# Patient Record
Sex: Male | Born: 1950 | Race: White | Hispanic: No | Smoking: Never smoker
Health system: Southern US, Community
[De-identification: ages and names within clinical notes are randomized; demographics above are authoritative.]

## PROBLEM LIST (undated history)

## (undated) DIAGNOSIS — I499 Cardiac arrhythmia, unspecified: Secondary | ICD-10-CM

## (undated) DIAGNOSIS — J449 Chronic obstructive pulmonary disease, unspecified: Secondary | ICD-10-CM

## (undated) DIAGNOSIS — I509 Heart failure, unspecified: Secondary | ICD-10-CM

## (undated) DIAGNOSIS — E785 Hyperlipidemia, unspecified: Secondary | ICD-10-CM

## (undated) DIAGNOSIS — F419 Anxiety disorder, unspecified: Secondary | ICD-10-CM

## (undated) DIAGNOSIS — G4730 Sleep apnea, unspecified: Secondary | ICD-10-CM

## (undated) DIAGNOSIS — F32A Depression, unspecified: Secondary | ICD-10-CM

## (undated) DIAGNOSIS — I10 Essential (primary) hypertension: Secondary | ICD-10-CM

## (undated) DIAGNOSIS — E039 Hypothyroidism, unspecified: Secondary | ICD-10-CM

## (undated) HISTORY — DX: Essential (primary) hypertension: I10

## (undated) HISTORY — PX: FRACTURE SURGERY: SHX138

## (undated) HISTORY — DX: Chronic obstructive pulmonary disease, unspecified: J44.9

## (undated) HISTORY — DX: Hyperlipidemia, unspecified: E78.5

---

## 2018-09-20 DIAGNOSIS — I10 Essential (primary) hypertension: Secondary | ICD-10-CM | POA: Insufficient documentation

## 2018-09-20 DIAGNOSIS — E039 Hypothyroidism, unspecified: Secondary | ICD-10-CM

## 2018-09-20 DIAGNOSIS — I5032 Chronic diastolic (congestive) heart failure: Secondary | ICD-10-CM

## 2018-09-20 DIAGNOSIS — Z6841 Body Mass Index (BMI) 40.0 and over, adult: Secondary | ICD-10-CM

## 2018-09-20 DIAGNOSIS — I5033 Acute on chronic diastolic (congestive) heart failure: Secondary | ICD-10-CM

## 2018-10-02 DIAGNOSIS — S46012A Strain of muscle(s) and tendon(s) of the rotator cuff of left shoulder, initial encounter: Secondary | ICD-10-CM | POA: Insufficient documentation

## 2018-10-31 DIAGNOSIS — G4730 Sleep apnea, unspecified: Secondary | ICD-10-CM

## 2018-10-31 DIAGNOSIS — G4733 Obstructive sleep apnea (adult) (pediatric): Secondary | ICD-10-CM

## 2019-03-16 DIAGNOSIS — M1711 Unilateral primary osteoarthritis, right knee: Secondary | ICD-10-CM | POA: Insufficient documentation

## 2019-10-31 DIAGNOSIS — E785 Hyperlipidemia, unspecified: Secondary | ICD-10-CM

## 2019-10-31 DIAGNOSIS — E782 Mixed hyperlipidemia: Secondary | ICD-10-CM

## 2019-11-14 DIAGNOSIS — E538 Deficiency of other specified B group vitamins: Secondary | ICD-10-CM | POA: Insufficient documentation

## 2019-11-14 DIAGNOSIS — Z0000 Encounter for general adult medical examination without abnormal findings: Secondary | ICD-10-CM | POA: Insufficient documentation

## 2020-11-04 ENCOUNTER — Other Ambulatory Visit: Payer: Self-pay

## 2020-11-04 ENCOUNTER — Emergency Department: Payer: Medicare Other

## 2020-11-04 ENCOUNTER — Emergency Department
Admission: EM | Admit: 2020-11-04 | Discharge: 2020-11-05 | Disposition: A | Discharge status: Home / Self Care | Payer: Medicare Other | Attending: Emergency Medicine | Admitting: Emergency Medicine

## 2020-11-04 DIAGNOSIS — E039 Hypothyroidism, unspecified: Secondary | ICD-10-CM | POA: Diagnosis not present

## 2020-11-04 DIAGNOSIS — S92302A Fracture of unspecified metatarsal bone(s), left foot, initial encounter for closed fracture: Secondary | ICD-10-CM

## 2020-11-04 DIAGNOSIS — S92511A Displaced fracture of proximal phalanx of right lesser toe(s), initial encounter for closed fracture: Secondary | ICD-10-CM | POA: Diagnosis not present

## 2020-11-04 DIAGNOSIS — S8255XA Nondisplaced fracture of medial malleolus of left tibia, initial encounter for closed fracture: Secondary | ICD-10-CM | POA: Diagnosis not present

## 2020-11-04 DIAGNOSIS — W010XXA Fall on same level from slipping, tripping and stumbling without subsequent striking against object, initial encounter: Secondary | ICD-10-CM | POA: Insufficient documentation

## 2020-11-04 DIAGNOSIS — I110 Hypertensive heart disease with heart failure: Secondary | ICD-10-CM | POA: Insufficient documentation

## 2020-11-04 DIAGNOSIS — S99912A Unspecified injury of left ankle, initial encounter: Secondary | ICD-10-CM | POA: Diagnosis present

## 2020-11-04 DIAGNOSIS — S82892A Other fracture of left lower leg, initial encounter for closed fracture: Secondary | ICD-10-CM

## 2020-11-04 DIAGNOSIS — M79672 Pain in left foot: Secondary | ICD-10-CM

## 2020-11-04 DIAGNOSIS — Y92002 Bathroom of unspecified non-institutional (private) residence single-family (private) house as the place of occurrence of the external cause: Secondary | ICD-10-CM | POA: Diagnosis not present

## 2020-11-04 DIAGNOSIS — W19XXXA Unspecified fall, initial encounter: Secondary | ICD-10-CM

## 2020-11-04 DIAGNOSIS — I509 Heart failure, unspecified: Secondary | ICD-10-CM | POA: Diagnosis not present

## 2020-11-04 DIAGNOSIS — J449 Chronic obstructive pulmonary disease, unspecified: Secondary | ICD-10-CM | POA: Insufficient documentation

## 2020-11-04 NOTE — ED Triage Notes (Signed)
Pt brought in by [facility] from home, they were called out to the house earlier today due to fall. But did not transport, pt fell again and is now having left ankle pain.

## 2020-11-04 NOTE — ED Provider Notes (Signed)
[facility] Emergency Department Provider Note   ____________________________________________   Event Date/Time   First MD Initiated Contact with Patient 11/04/20 2321     (approximate)  I have reviewed the triage vital signs and the nursing notes.   HISTORY  Chief Complaint Foot Pain    HPI Christopher [name] is a 70 y.o. male brought to the ED via EMS from home with a chief complaint of left foot and ankle pain status post mechanical slip and fall in the bathroom. Patient denies multiple falls today as reported by triage. Denies striking head or LOC. Complains of left foot and ankle pain. Denies patient changes, neck pain, chest pain, shortness of breath, abdominal pain, nausea, vomiting or dizziness.  History of COPD, CHF on continuous oxygen.     Past medical history Acquired hypothyroidism, unspecified (09/20/2018), Benign essential hypertension (09/20/2018), COPD (chronic obstructive pulmonary disease) (CMS-HCC), Depression, Hyperlipidemia, mixed (09/20/2018), Hypertension, Kidney stone, and Osteoarthritis   Prior to Admission medications   Medication Sig Start Date End Date Taking? Authorizing Provider  oxyCODONE-acetaminophen (PERCOCET/ROXICET) 5-325 MG tablet Take 1 tablet by mouth every 4 (four) hours as needed for severe pain. 11/05/20  Yes Paulette Blanch, MD    Allergies Patient has no allergy information on record.  No family history on file.  Social History    Review of Systems  Constitutional: No fever/chills Eyes: No visual changes. ENT: No sore throat. Cardiovascular: Denies chest pain. Respiratory: Denies shortness of breath. Gastrointestinal: No abdominal pain.  No nausea, no vomiting.  No diarrhea.  No constipation. Genitourinary: Negative for dysuria. Musculoskeletal: Positive for left foot and ankle pain.  Negative for back pain. Skin: Negative for rash. Neurological: Negative for headaches, focal weakness or  numbness.   ____________________________________________   PHYSICAL EXAM:  VITAL SIGNS: ED Triage Vitals  Enc Vitals Group     BP 11/04/20 2313 (!) 120/59     Pulse Rate 11/04/20 2313 86     Resp 11/04/20 2313 20     Temp 11/04/20 2313 99.5 F (37.5 C)     Temp Source 11/04/20 2313 Oral     SpO2 11/04/20 2313 92 %     Weight 11/04/20 2310 (!) 370 lb (167.8 kg)     Height 11/04/20 2310 6' (1.829 m)     Head Circumference --      Peak Flow --      Pain Score 11/04/20 2310 1     Pain Loc --      Pain Edu? --      Excl. in [place]? --     Constitutional: Alert and oriented. Well appearing and in no acute distress. Eyes: Conjunctivae are normal. PERRL. EOMI. Head: Atraumatic. Nose: Atraumatic. Mouth/Throat: Mucous membranes are moist.  No dental malocclusion.   Neck: No stridor.  No cervical spine tenderness to palpation. Cardiovascular: Normal rate, regular rhythm. Grossly normal heart sounds.  Good peripheral circulation. Respiratory: Normal respiratory effort.  No retractions. Lungs CTAB. Gastrointestinal: Obese.  Soft and nontender. No distention. No abdominal bruits. No CVA tenderness. Musculoskeletal:  BLE chronic lymphedema.  Left medial malleolus with mild swelling, tenderness to palpation and limited range of motion secondary to pain.  Tender to palpation dorsal left foot.  Palpable distal pulses.  Brisk, less than 5-second capillary refill. Neurologic:  Normal speech and language. No gross focal neurologic deficits are appreciated.  Skin:  Skin is warm, dry and intact. No rash noted. Psychiatric: Mood and affect are normal. Speech and behavior  are normal.  ____________________________________________   LABS (all labs ordered are listed, but only abnormal results are displayed)  Labs Reviewed - No data to display ____________________________________________  EKG  None ____________________________________________  RADIOLOGY I, Cortez Flippen J, personally viewed and  evaluated these images (plain radiographs) as part of my medical decision making, as well as reviewing the written report by the radiologist.  ED MD interpretation: Medial malleolar fracture, small distal tibial avulsion fracture, small first metatarsal intra-articular fracture  Official radiology report(s): DG Ankle Complete Left  Result Date: 11/04/2020 CLINICAL DATA:  Injury and pain fall EXAM: LEFT ANKLE COMPLETE - 3+ VIEW COMPARISON:  None. FINDINGS: Marked soft tissue edema. Acute mildly comminuted medial malleolar fracture. Ankle mortise grossly symmetric. Small avulsion fracture injury off the anterior inferior tibia. Small intra-articular fracture at the base of the first metatarsal. Moderate plantar calcaneal spur. IMPRESSION: 1. Acute mildly comminuted medial malleolar fracture. 2. Small avulsion fracture injury off the distal anterior inferior tibia. 3. Small intra-articular fracture at the base of the first metatarsal. Electronically Signed   By: Donavan Foil M.D.   On: 11/04/2020 23:49   DG Foot Complete Left  Result Date: 11/04/2020 CLINICAL DATA:  Fall with pain EXAM: LEFT FOOT - COMPLETE 3+ VIEW COMPARISON:  None. FINDINGS: Abundant soft tissue edema. Acute nondisplaced medial malleolar fracture. Small intra-articular fracture at the volar base of the first metatarsal on lateral view. Avulsion fracture off the anterior inferior distal tibia. Large plantar calcaneal spur. Alignment and assessment for Lisfranc injury limited secondary to positioning. IMPRESSION: 1. Acute nondisplaced medial malleolar fracture. 2. Small intra-articular fracture at the volar base of the first metatarsal. 3. Avulsion fracture off the anterior inferior tibia. 4. Question malalignment at the Lisfranc interval but limited secondary to positioning. Electronically Signed   By: Donavan Foil M.D.   On: 11/04/2020 23:53    ____________________________________________   PROCEDURES  Procedure(s) performed  (including Critical Care):  .Splint Application  Date/Time: 11/05/2020 1:20 AM Performed by: Paulette Blanch, MD Authorized by: Paulette Blanch, MD   Consent:    Consent obtained:  Verbal   Consent given by:  Patient   Risks, benefits, and alternatives were discussed: yes     Risks discussed:  Discoloration, numbness, pain and swelling   Alternatives discussed:  No treatment Pre-procedure details:    Distal neurologic exam:  Normal   Distal perfusion: distal pulses strong and brisk capillary refill   Procedure details:    Location:  Ankle   Ankle location:  L ankle   Splint type:  Ankle stirrup   Supplies:  Cotton padding, elastic bandage and plaster Post-procedure details:    Distal neurologic exam:  Normal   Distal perfusion: distal pulses strong and brisk capillary refill     Procedure completion:  Tolerated well, no immediate complications   Post-procedure imaging: not applicable       ____________________________________________   INITIAL IMPRESSION / ASSESSMENT AND PLAN / ED COURSE  As part of my medical decision making, I reviewed the following data within the [place] notes reviewed and incorporated, Old chart reviewed, Radiograph reviewed, Notes from prior ED visits and [place] Controlled Substance Database     70 year old male presenting with left ankle and foot pain status post mechanical fall.  Will obtain x-ray imaging studies, administer analgesia and reassess.  Clinical Course as of 11/05/20 0019  Wed Nov 05, 2020  0016 Updated patient on imaging results.  He has help at home to help  him get around.  Religiously uses his walker already.  Has seen Dr. Roland Rack from orthopedics in the past and will follow up with him.  Will place in splint, administer Percocet, educated patient on nonweightbearing on left leg.  Strict return precautions given.  Patient verbalizes understanding and agrees with plan of care. [JS]    Clinical Course User Index [JS]  Paulette Blanch, MD     ____________________________________________   FINAL CLINICAL IMPRESSION(S) / ED DIAGNOSES  Final diagnoses:  Fall, initial encounter  Closed fracture of left ankle, initial encounter  Fracture of unspecified metatarsal bone(s), left foot, initial encounter for closed fracture  Foot pain, left     ED Discharge Orders         Ordered    oxyCODONE-acetaminophen (PERCOCET/ROXICET) 5-325 MG tablet  Every 4 hours PRN        11/05/20 0014          *Please note:  Uhuru Hacking was evaluated in Emergency Department on 11/05/2020 for the symptoms described in the history of present illness. He was evaluated in the context of the global COVID-19 pandemic, which necessitated consideration that the patient might be at risk for infection with the SARS-CoV-2 virus that causes COVID-19. Institutional protocols and algorithms that pertain to the evaluation of patients at risk for COVID-19 are in a state of rapid change based on information released by regulatory bodies including the CDC and federal and state organizations. These policies and algorithms were followed during the patient's care in the ED.  Some ED evaluations and interventions may be delayed as a result of limited staffing during and the pandemic.*   Note:  This document was prepared using Dragon voice recognition software and may include unintentional dictation errors.   Paulette Blanch, MD 11/05/20 [phone]

## 2020-11-04 NOTE — ED Provider Notes (Incomplete)
[facility] Emergency Department Provider Note   ____________________________________________   Event Date/Time   First MD Initiated Contact with Patient 11/04/20 2321     (approximate)  I have reviewed the triage vital signs and the nursing notes.   HISTORY  Chief Complaint Foot Pain    HPI Christopher [name] is a 70 y.o. male brought to the ED via EMS from home with a chief complaint of left foot and ankle pain status post mechanical slip and fall in the bathroom. Patient denies multiple falls today as reported by triage. Denies striking head or LOC. Complains of left foot and ankle pain. Denies patient changes, neck pain, chest pain, shortness of breath, abdominal pain, nausea, vomiting or dizziness.  History of COPD, CHF on continuous oxygen.     Past medical history Acquired hypothyroidism, unspecified (09/20/2018), Benign essential hypertension (09/20/2018), COPD (chronic obstructive pulmonary disease) (CMS-HCC), Depression, Hyperlipidemia, mixed (09/20/2018), Hypertension, Kidney stone, and Osteoarthritis   Prior to Admission medications   Not on File    Allergies Patient has no allergy information on record.  No family history on file.  Social History    Review of Systems  Constitutional: No fever/chills Eyes: No visual changes. ENT: No sore throat. Cardiovascular: Denies chest pain. Respiratory: Denies shortness of breath. Gastrointestinal: No abdominal pain.  No nausea, no vomiting.  No diarrhea.  No constipation. Genitourinary: Negative for dysuria. Musculoskeletal: Positive for left foot and ankle pain.  Negative for back pain. Skin: Negative for rash. Neurological: Negative for headaches, focal weakness or numbness.   ____________________________________________   PHYSICAL EXAM:  VITAL SIGNS: ED Triage Vitals  Enc Vitals Group     BP 11/04/20 2313 (!) 120/59     Pulse Rate 11/04/20 2313 86     Resp 11/04/20 2313 20      Temp 11/04/20 2313 99.5 F (37.5 C)     Temp Source 11/04/20 2313 Oral     SpO2 11/04/20 2313 92 %     Weight 11/04/20 2310 (!) 370 lb (167.8 kg)     Height 11/04/20 2310 6' (1.829 m)     Head Circumference --      Peak Flow --      Pain Score 11/04/20 2310 1     Pain Loc --      Pain Edu? --      Excl. in [place]? --     Constitutional: Alert and oriented. Well appearing and in no acute distress. Eyes: Conjunctivae are normal. PERRL. EOMI. Head: Atraumatic. Nose: Atraumatic. Mouth/Throat: Mucous membranes are moist.  No dental malocclusion.   Neck: No stridor.  No cervical spine tenderness to palpation. Cardiovascular: Normal rate, regular rhythm. Grossly normal heart sounds.  Good peripheral circulation. Respiratory: Normal respiratory effort.  No retractions. Lungs CTAB. Gastrointestinal: Obese.  Soft and nontender. No distention. No abdominal bruits. No CVA tenderness. Musculoskeletal:  BLE chronic lymphedema.  Left medial malleolus with mild swelling, tenderness to palpation and limited range of motion secondary to pain.  Tender to palpation dorsal left foot.  Palpable distal pulses.  Brisk, less than 5-second capillary refill. Neurologic:  Normal speech and language. No gross focal neurologic deficits are appreciated.  Skin:  Skin is warm, dry and intact. No rash noted. Psychiatric: Mood and affect are normal. Speech and behavior are normal.  ____________________________________________   LABS (all labs ordered are listed, but only abnormal results are displayed)  Labs Reviewed - No data to display ____________________________________________  EKG  None ____________________________________________  RADIOLOGY I, SUNG,JADE J, personally viewed and evaluated these images (plain radiographs) as part of my medical decision making, as well as reviewing the written report by the radiologist.  ED MD interpretation:  ***  Official radiology report(s): No results  found.  ____________________________________________   PROCEDURES  Procedure(s) performed (including Critical Care):  Procedures   ____________________________________________   INITIAL IMPRESSION / ASSESSMENT AND PLAN / ED COURSE  As part of my medical decision making, I reviewed the following data within the [place] notes reviewed and incorporated, Old chart reviewed, Radiograph reviewed, Notes from prior ED visits and [place] Controlled Substance Database     70 year old male presenting with left ankle and foot pain status post mechanical fall.      ____________________________________________   FINAL CLINICAL IMPRESSION(S) / ED DIAGNOSES  Final diagnoses:  Fall, initial encounter     ED Discharge Orders    None      *Please note:  Christopher [name] was evaluated in Emergency Department on 11/04/2020 for the symptoms described in the history of present illness. He was evaluated in the context of the global COVID-19 pandemic, which necessitated consideration that the patient might be at risk for infection with the SARS-CoV-2 virus that causes COVID-19. Institutional protocols and algorithms that pertain to the evaluation of patients at risk for COVID-19 are in a state of rapid change based on information released by regulatory bodies including the CDC and federal and state organizations. These policies and algorithms were followed during the patient's care in the ED.  Some ED evaluations and interventions may be delayed as a result of limited staffing during and the pandemic.*   Note:  This document was prepared using Dragon voice recognition software and may include unintentional dictation errors.

## 2020-11-05 MED ORDER — OXYCODONE-ACETAMINOPHEN 5-325 MG PO TABS
1.0000 | ORAL_TABLET | Freq: Once | ORAL | Status: AC
  Administered 2020-11-05: 1 via ORAL
  Filled 2020-11-05: qty 1

## 2020-11-05 MED ORDER — OXYCODONE-ACETAMINOPHEN 5-325 MG PO TABS: 1.0000 | ORAL_TABLET | ORAL | 0 refills | Status: DC | PRN

## 2020-11-05 NOTE — ED Notes (Signed)
Splint applied by ED tech Jess, assessed by Dr. Beather Arbour.

## 2020-11-05 NOTE — Discharge Instructions (Signed)
1.  Keep splint clean and dry.  Use your walker at all times walk.  Do not bear weight on your left leg.  Elevate affected area and apply ice over splint several times daily to reduce swelling. 2.  You may take Percocet as needed for pain. 3.  Return to the ER for worsening symptoms, persistent vomiting, difficulty breathing or other concerns.

## 2020-11-05 NOTE — ED Notes (Signed)
Pt reports he has a walker and crutches at home, reports he feels comfortable to be discharged and does not feel he needs additional teaching to use them. Education provided not to put weight on splint. Pt and pt's daughter report they feel safe to be discharged home, pt's daughter driving pt home. Pt assisted into pt's daughters care.

## 2020-11-06 ENCOUNTER — Other Ambulatory Visit: Payer: Self-pay | Admitting: Podiatry

## 2020-11-06 DIAGNOSIS — S93325A Dislocation of tarsometatarsal joint of left foot, initial encounter: Secondary | ICD-10-CM

## 2020-11-10 ENCOUNTER — Other Ambulatory Visit: Payer: Self-pay

## 2020-11-10 ENCOUNTER — Ambulatory Visit
Admission: RE | Admit: 2020-11-10 | Discharge: 2020-11-10 | Disposition: A | Discharge status: Home / Self Care | Payer: Medicare Other | Source: Ambulatory Visit | Attending: Podiatry | Admitting: Podiatry

## 2020-11-10 DIAGNOSIS — S93325A Dislocation of tarsometatarsal joint of left foot, initial encounter: Secondary | ICD-10-CM | POA: Insufficient documentation

## 2020-12-16 ENCOUNTER — Inpatient Hospital Stay
Admission: EM | Admit: 2020-12-16 | Discharge: 2020-12-22 | DRG: 493 | Disposition: A | Discharge status: Home / Self Care | Payer: Medicare Other | Attending: Hospitalist | Admitting: Hospitalist

## 2020-12-16 ENCOUNTER — Other Ambulatory Visit: Payer: Self-pay

## 2020-12-16 ENCOUNTER — Emergency Department: Payer: Medicare Other

## 2020-12-16 DIAGNOSIS — I130 Hypertensive heart and chronic kidney disease with heart failure and stage 1 through stage 4 chronic kidney disease, or unspecified chronic kidney disease: Secondary | ICD-10-CM | POA: Diagnosis present

## 2020-12-16 DIAGNOSIS — N1832 Chronic kidney disease, stage 3b: Secondary | ICD-10-CM | POA: Diagnosis present

## 2020-12-16 DIAGNOSIS — M25579 Pain in unspecified ankle and joints of unspecified foot: Secondary | ICD-10-CM | POA: Diagnosis present

## 2020-12-16 DIAGNOSIS — M869 Osteomyelitis, unspecified: Secondary | ICD-10-CM | POA: Insufficient documentation

## 2020-12-16 DIAGNOSIS — F341 Dysthymic disorder: Secondary | ICD-10-CM | POA: Diagnosis not present

## 2020-12-16 DIAGNOSIS — G4730 Sleep apnea, unspecified: Secondary | ICD-10-CM

## 2020-12-16 DIAGNOSIS — E039 Hypothyroidism, unspecified: Secondary | ICD-10-CM | POA: Diagnosis present

## 2020-12-16 DIAGNOSIS — E782 Mixed hyperlipidemia: Secondary | ICD-10-CM | POA: Diagnosis present

## 2020-12-16 DIAGNOSIS — I5032 Chronic diastolic (congestive) heart failure: Secondary | ICD-10-CM

## 2020-12-16 DIAGNOSIS — S9305XA Dislocation of left ankle joint, initial encounter: Secondary | ICD-10-CM | POA: Diagnosis present

## 2020-12-16 DIAGNOSIS — S82892A Other fracture of left lower leg, initial encounter for closed fracture: Secondary | ICD-10-CM | POA: Diagnosis present

## 2020-12-16 DIAGNOSIS — Z888 Allergy status to other drugs, medicaments and biological substances status: Secondary | ICD-10-CM | POA: Diagnosis not present

## 2020-12-16 DIAGNOSIS — G4733 Obstructive sleep apnea (adult) (pediatric): Secondary | ICD-10-CM | POA: Diagnosis present

## 2020-12-16 DIAGNOSIS — F32A Depression, unspecified: Secondary | ICD-10-CM

## 2020-12-16 DIAGNOSIS — S9305XS Dislocation of left ankle joint, sequela: Secondary | ICD-10-CM

## 2020-12-16 DIAGNOSIS — Z6841 Body Mass Index (BMI) 40.0 and over, adult: Secondary | ICD-10-CM | POA: Diagnosis not present

## 2020-12-16 DIAGNOSIS — S8252XA Displaced fracture of medial malleolus of left tibia, initial encounter for closed fracture: Principal | ICD-10-CM | POA: Diagnosis present

## 2020-12-16 DIAGNOSIS — Z9989 Dependence on other enabling machines and devices: Secondary | ICD-10-CM

## 2020-12-16 DIAGNOSIS — J449 Chronic obstructive pulmonary disease, unspecified: Secondary | ICD-10-CM | POA: Diagnosis present

## 2020-12-16 DIAGNOSIS — Z79899 Other long term (current) drug therapy: Secondary | ICD-10-CM | POA: Diagnosis not present

## 2020-12-16 DIAGNOSIS — W010XXA Fall on same level from slipping, tripping and stumbling without subsequent striking against object, initial encounter: Secondary | ICD-10-CM | POA: Diagnosis present

## 2020-12-16 DIAGNOSIS — G629 Polyneuropathy, unspecified: Secondary | ICD-10-CM | POA: Diagnosis present

## 2020-12-16 DIAGNOSIS — Z79890 Hormone replacement therapy (postmenopausal): Secondary | ICD-10-CM

## 2020-12-16 DIAGNOSIS — Z20822 Contact with and (suspected) exposure to covid-19: Secondary | ICD-10-CM | POA: Diagnosis present

## 2020-12-16 DIAGNOSIS — I5033 Acute on chronic diastolic (congestive) heart failure: Secondary | ICD-10-CM

## 2020-12-16 DIAGNOSIS — E785 Hyperlipidemia, unspecified: Secondary | ICD-10-CM

## 2020-12-16 DIAGNOSIS — Z91048 Other nonmedicinal substance allergy status: Secondary | ICD-10-CM | POA: Diagnosis not present

## 2020-12-16 DIAGNOSIS — F419 Anxiety disorder, unspecified: Secondary | ICD-10-CM | POA: Diagnosis present

## 2020-12-16 LAB — RESP PANEL BY RT-PCR (FLU A&B, COVID) ARPGX2
Influenza A by PCR: NEGATIVE
Influenza B by PCR: NEGATIVE
SARS Coronavirus 2 by RT PCR: NEGATIVE

## 2020-12-16 LAB — BASIC METABOLIC PANEL
Anion gap: 10 (ref 5–15)
BUN: 40 mg/dL — ABNORMAL HIGH (ref 8–23)
CO2: 33 mmol/L — ABNORMAL HIGH (ref 22–32)
Calcium: 9.2 mg/dL (ref 8.9–10.3)
Chloride: 92 mmol/L — ABNORMAL LOW (ref 98–111)
Creatinine, Ser: 1.87 mg/dL — ABNORMAL HIGH (ref 0.61–1.24)
GFR, Estimated: 38 mL/min — ABNORMAL LOW (ref >60)
Glucose, Bld: 95 mg/dL (ref 70–99)
Potassium: 3.8 mmol/L (ref 3.5–5.1)
Sodium: 135 mmol/L (ref 135–145)

## 2020-12-16 LAB — CBC WITH DIFFERENTIAL/PLATELET
Abs Immature Granulocytes: 0.09 10*3/uL — ABNORMAL HIGH (ref 0.00–0.07)
Basophils Absolute: 0.1 10*3/uL (ref 0.0–0.1)
Basophils Relative: 1 %
Eosinophils Absolute: 0.3 10*3/uL (ref 0.0–0.5)
Eosinophils Relative: 3 %
HCT: 41.5 % (ref 39.0–52.0)
Hemoglobin: 13.3 g/dL (ref 13.0–17.0)
Immature Granulocytes: 1 %
Lymphocytes Relative: 12 %
Lymphs Abs: 1 10*3/uL (ref 0.7–4.0)
MCH: 28.9 pg (ref 26.0–34.0)
MCHC: 32 g/dL (ref 30.0–36.0)
MCV: 90 fL (ref 80.0–100.0)
Monocytes Absolute: 0.8 10*3/uL (ref 0.1–1.0)
Monocytes Relative: 9 %
Neutro Abs: 6.6 10*3/uL (ref 1.7–7.7)
Neutrophils Relative %: 74 %
Platelets: 225 10*3/uL (ref 150–400)
RBC: 4.61 MIL/uL (ref 4.22–5.81)
RDW: 14.6 % (ref 11.5–15.5)
WBC: 8.9 10*3/uL (ref 4.0–10.5)
nRBC: 0 % (ref 0.0–0.2)

## 2020-12-16 LAB — PROTIME-INR
INR: 1.1 (ref 0.8–1.2)
Prothrombin Time: 13.9 seconds (ref 11.4–15.2)

## 2020-12-16 LAB — TROPONIN I (HIGH SENSITIVITY)
Troponin I (High Sensitivity): 8 ng/L (ref <18)
Troponin I (High Sensitivity): 7 ng/L (ref <18)

## 2020-12-16 LAB — HIV ANTIBODY (ROUTINE TESTING W REFLEX): HIV Screen 4th Generation wRfx: NONREACTIVE

## 2020-12-16 LAB — BRAIN NATRIURETIC PEPTIDE: B Natriuretic Peptide: 36.7 pg/mL (ref 0.0–100.0)

## 2020-12-16 MED ORDER — OXYCODONE-ACETAMINOPHEN 5-325 MG PO TABS
1.0000 | ORAL_TABLET | Freq: Four times a day (QID) | ORAL | Status: DC | PRN
  Administered 2020-12-16: 2 via ORAL
  Filled 2020-12-16: qty 2

## 2020-12-16 MED ORDER — MORPHINE SULFATE (PF) 2 MG/ML IV SOLN: 0.5000 mg | INTRAVENOUS | Status: DC | PRN

## 2020-12-16 MED ORDER — CHLORHEXIDINE GLUCONATE 4 % EX LIQD
60.0000 mL | Freq: Once | CUTANEOUS | Status: AC
  Administered 2020-12-17: 4 via TOPICAL

## 2020-12-16 MED ORDER — VITAMIN D 25 MCG (1000 UNIT) PO TABS
1000.0000 [IU] | ORAL_TABLET | Freq: Every day | ORAL | Status: DC
  Administered 2020-12-18 – 2020-12-22 (×5): 1000 [IU] via ORAL
  Filled 2020-12-16 (×5): qty 1

## 2020-12-16 MED ORDER — DEXTROSE 5 % IV SOLN
3.0000 g | INTRAVENOUS | Status: AC
  Administered 2020-12-17: 3 g via INTRAVENOUS
  Filled 2020-12-16: qty 3

## 2020-12-16 MED ORDER — ONDANSETRON HCL 4 MG PO TABS: 4.0000 mg | ORAL_TABLET | Freq: Four times a day (QID) | ORAL | Status: DC | PRN

## 2020-12-16 MED ORDER — METOLAZONE 2.5 MG PO TABS
2.5000 mg | ORAL_TABLET | Freq: Every day | ORAL | Status: DC
  Administered 2020-12-18 – 2020-12-19 (×2): 2.5 mg via ORAL
  Filled 2020-12-16 (×3): qty 1

## 2020-12-16 MED ORDER — POTASSIUM CHLORIDE CRYS ER 10 MEQ PO TBCR
10.0000 meq | EXTENDED_RELEASE_TABLET | Freq: Two times a day (BID) | ORAL | Status: DC
  Administered 2020-12-17 – 2020-12-22 (×10): 10 meq via ORAL
  Filled 2020-12-16 (×11): qty 1

## 2020-12-16 MED ORDER — ACETAMINOPHEN 325 MG PO TABS: 650.0000 mg | ORAL_TABLET | Freq: Four times a day (QID) | ORAL | Status: AC | PRN

## 2020-12-16 MED ORDER — GABAPENTIN 300 MG PO CAPS
300.0000 mg | ORAL_CAPSULE | Freq: Three times a day (TID) | ORAL | Status: DC
  Administered 2020-12-17 – 2020-12-22 (×14): 300 mg via ORAL
  Filled 2020-12-16 (×15): qty 1

## 2020-12-16 MED ORDER — ATORVASTATIN CALCIUM 20 MG PO TABS
20.0000 mg | ORAL_TABLET | Freq: Every day | ORAL | Status: DC
  Administered 2020-12-18 – 2020-12-22 (×5): 20 mg via ORAL
  Filled 2020-12-16 (×5): qty 1

## 2020-12-16 MED ORDER — HEPARIN SODIUM (PORCINE) 5000 UNIT/ML IJ SOLN
5000.0000 [IU] | Freq: Three times a day (TID) | INTRAMUSCULAR | Status: AC
  Administered 2020-12-16: 5000 [IU] via SUBCUTANEOUS
  Filled 2020-12-16: qty 1

## 2020-12-16 MED ORDER — FUROSEMIDE 40 MG PO TABS
40.0000 mg | ORAL_TABLET | Freq: Every day | ORAL | Status: DC
  Administered 2020-12-18: 40 mg via ORAL
  Filled 2020-12-16 (×2): qty 1

## 2020-12-16 MED ORDER — ACETAMINOPHEN 650 MG RE SUPP: 650.0000 mg | Freq: Four times a day (QID) | RECTAL | Status: AC | PRN

## 2020-12-16 MED ORDER — BUPROPION HCL ER (SR) 150 MG PO TB12
150.0000 mg | ORAL_TABLET | Freq: Two times a day (BID) | ORAL | Status: DC
  Administered 2020-12-17 – 2020-12-22 (×10): 150 mg via ORAL
  Filled 2020-12-16 (×13): qty 1

## 2020-12-16 MED ORDER — CEFAZOLIN IN SODIUM CHLORIDE 3-0.9 GM/100ML-% IV SOLN
3.0000 g | INTRAVENOUS | Status: DC
  Filled 2020-12-16: qty 100

## 2020-12-16 MED ORDER — ONDANSETRON HCL 4 MG/2ML IJ SOLN: 4.0000 mg | Freq: Four times a day (QID) | INTRAMUSCULAR | Status: DC | PRN

## 2020-12-16 MED ORDER — HYDRALAZINE HCL 25 MG PO TABS: 25.0000 mg | ORAL_TABLET | Freq: Three times a day (TID) | ORAL | Status: DC | PRN

## 2020-12-16 MED ORDER — OXYCODONE-ACETAMINOPHEN 5-325 MG PO TABS: 1.0000 | ORAL_TABLET | ORAL | Status: DC | PRN

## 2020-12-16 MED ORDER — LEVALBUTEROL TARTRATE 45 MCG/ACT IN AERO
2.0000 | INHALATION_SPRAY | RESPIRATORY_TRACT | Status: DC | PRN
  Filled 2020-12-16: qty 15

## 2020-12-16 MED ORDER — TRAMADOL HCL 50 MG PO TABS
50.0000 mg | ORAL_TABLET | Freq: Four times a day (QID) | ORAL | Status: DC | PRN
Start: 2020-12-16 — End: 2020-12-22

## 2020-12-16 MED ORDER — LEVOTHYROXINE SODIUM 88 MCG PO TABS
88.0000 ug | ORAL_TABLET | Freq: Every day | ORAL | Status: DC
  Administered 2020-12-18 – 2020-12-22 (×5): 88 ug via ORAL
  Filled 2020-12-16 (×7): qty 1

## 2020-12-16 MED ORDER — POVIDONE-IODINE 10 % EX SWAB
2.0000 "application " | Freq: Once | CUTANEOUS | Status: AC
  Administered 2020-12-17: 2 via TOPICAL

## 2020-12-16 NOTE — H&P (Signed)
History and Physical   Christopher [name] ZOX:[id number] DOB: [birth year] DOA: 12/16/2020  PCP: Rusty Aus, MD  Outpatient Specialists: Dr. Lanney Gins Patient coming from: home  I have personally briefly reviewed patient's old medical records in [place].  Chief Concern: left ankle pain  HPI: Christopher [name] is a 70 y.o. male with medical history significant for hypertension, heart failure preserved ejection fraction, OSA on CPAP, morbid obesity, hypothyroid, depression, presents to the emergency department for chief concerns of left ankle fracture.  He was sent to the emergency department by his podiatrist due to left ankle fracture acute on chronic.  At bedside, Christopher [name] endorses left ankle pain.  He denies headache, vision changes, nausea, vomiting, abdominal pain, chest pain, shortness of breath, dysuria, hematuria.  He says that the bilateral trace swelling in his lower extremity is normal in his skin changes are normal.  He states that about 6 weeks ago he slipped and fell in the bathroom and fractured his ankle.  He presented to his podiatrist and it has been managed conservatively.  Then on April 13, patient slipped in the bathroom again.  He denies head trauma and loss of consciousness.  Social history: He lives at home with his wife.  His daughter and her boyfriend does live in the home.  He denies tobacco, EtOH, recreational drug use.  Patient is currently retired.  He formally worked as a Barrister's clerk.  Vaccination: he is vaccinated for COVID-19, 2 doses and 1 booster  ROS: Constitutional: no weight change, no fever ENT/Mouth: no sore throat, no rhinorrhea Eyes: no eye pain, no vision changes Cardiovascular: no chest pain, no dyspnea,  no edema, no palpitations Respiratory: no cough, no sputum, no wheezing Gastrointestinal: no nausea, no vomiting, no diarrhea, no constipation Genitourinary: no urinary incontinence, no dysuria, no hematuria Musculoskeletal: no  arthralgias, no myalgias Skin: no skin lesions, no pruritus, Neuro: no weakness, no loss of consciousness, no syncope Psych: no anxiety, no depression, no decrease appetite Heme/Lymph: no bruising, no bleeding  ED Course: Discussed with EDP, patient requiring hospitalization for acute on chronic left ankle fracture.  Vitals in the emergency department showed a temperature of 98.3, respiration rate of 18, heart rate 74, blood pressure 107/69, SPO2 of 91% on room air.  He received 1 dose of oxycodone 5 mg in the ED for pain.  Labs in the emergency department was remarkable for temperature of 98.3, respiration rate of 18, heart rate 74, blood pressure 107/69, SPO2 of 91% on room air.  Labs in the emergency department was remarkable for sodium 135, potassium 3.8, chloride 92, bicarb 33, BUN 40, serum creatinine 1.  8 7, nonfasting blood glucose 95, WBC 8.9, hemoglobin 13.3, platelets 225.  EGFR 38.  Assessment/Plan  Principal Problem:   Closed left ankle fracture Active Problems:   OSA on CPAP   Morbid obesity with BMI of 50.0-59.9, adult (HCC)   Hyperlipidemia, mixed   Depression   Hypothyroid   Chronic diastolic CHF (congestive heart failure) (HCC)   # Closed left ankle fracture - plan for ORIF in the a.m. - Secondary to mechanical fall x 2  - N.p.o. at midnight - Pain control: Acetaminophen 650 mg every 6 hours as needed for mild pain, tramadol 50 mg every 6 hours.  For moderate pain, morphine 0.5 mg IV every 2 hours as needed for severe pain - PT ordered for 12/17/2020  # Hypertension- controlled - Resumed home metolazone 2.5 mg p.o. daily, furosemide 40 mg p.o. daily  #  Hyperlipidemia-atorvastatin 20 mg daily  # Depression/anxiety-resume bupropion 150 mg p.o. 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 3 times daily  resumed  # Obstructive sleep apnea-CPAP nightly ordered  # Morbid obesity  # DVT prophylaxis- TED hose - I gave 1 dose of heparin 5000 units - I have not continued pharmacologic DVT prophylaxis due to anticipation of orthopedic procedure - A.m. team/primary team to initiate pharmacologic DVT prophylaxis when it is appropriate  Chart reviewed.   Cardiac echo on 05/19/2020: Ejection fraction greater than 27%, grade 1 diastolic dysfunction  DVT prophylaxis: heparin 5000 units, one dose  Code Status: Full code Diet: Heart healthy now, n.p.o. at midnight Family Communication: Updated daughter at bedside Disposition Plan: Pending clinical course Consults called: Podiatry Admission status: Inpatient, MedSurg, with telemetry  No past medical history on file.  Social History:  has no history on file for tobacco use, alcohol use, and drug use.  Allergies  Allergen Reactions  . Albuterol Other (See Comments)    Causes afib   . Grass Extracts [Gramineae Pollens] Other (See Comments)    Sneeze and watery eyes  . Other     Other reaction(s): Other (See Comments) Unable to breath when around cats   No family history on file. Family history: Family history reviewed and not pertinent  Prior to Admission medications   Medication Sig Start Date End Date Taking? Authorizing Provider  oxyCODONE-acetaminophen (PERCOCET/ROXICET) 5-325 MG tablet Take 1 tablet by mouth every 4 (four) hours as needed for severe pain. 11/05/20   Paulette Blanch, MD   Physical Exam: Vitals:   12/16/20 1722 12/16/20 1730 12/16/20 1853 12/16/20 1958  BP:  96/63 (!) 147/110 (!) 117/59  Pulse: 81 74 76 77  Resp: (!) '25 16  17  ' Temp:   98.3 F (36.8 C) 98.4 F (36.9 C)  TempSrc:    Oral  SpO2: (!) 85% 95% 100% 94%  Weight:      Height:       Constitutional: appears , NAD, calm, comfortable Eyes: PERRL, lids and conjunctivae normal ENMT: Mucous membranes are moist. Age-appropriate dentition. Hearing  appropriate Neck: normal, supple, no masses, no thyromegaly Respiratory: clear to auscultation bilaterally, no wheezing, no crackles. Normal respiratory effort. No accessory muscle use.  Cardiovascular: Regular rate and rhythm, no murmurs / rubs / gallops.  Bilateral lower extremity trace edema. 2+ pedal pulses. No carotid bruits.  Brace in place of the left lower extremity Abdomen: Morbid obesity with pannus, no tenderness, no masses palpated, no hepatosplenomegaly. Bowel sounds positive.  Musculoskeletal: no clubbing / cyanosis. No joint deformity upper and lower extremities. Good ROM, no contractures, no atrophy. Normal muscle tone.  Skin: no rashes, lesions, ulcers. No induration Neurologic: Sensation intact. Strength 5/5 in all 4.  Psychiatric: Normal judgment and insight. Alert and oriented x 3. Normal mood.   EKG: independently reviewed, showing sinus rhythm with rate of 85, QTc 467  Chest x-ray on Admission: I personally reviewed and I agree with radiologist reading as below.  DG Chest Portable 1 View  Result Date: 12/16/2020 CLINICAL DATA:  Preoperative examination. Patient for fixation of left foot fractures. EXAM: PORTABLE CHEST 1 VIEW COMPARISON:  None. FINDINGS: Lungs are clear. Heart size is normal. No pneumothorax or pleural fluid. No acute or focal bony abnormality. IMPRESSION: No acute disease. Electronically Signed   By: Marcello Moores  Dalessio M.D.   On: 12/16/2020 16:20   Labs on Admission: I have personally reviewed following labs  CBC: Recent Labs  Lab 12/16/20 1547  WBC 8.9  NEUTROABS 6.6  HGB 13.3  HCT 41.5  MCV 90.0  PLT 623   Basic Metabolic Panel: Recent Labs  Lab 12/16/20 1547  NA 135  K 3.8  CL 92*  CO2 33*  GLUCOSE 95  BUN 40*  CREATININE 1.87*  CALCIUM 9.2   GFR: Estimated Creatinine Clearance: 59.1 mL/min (A) (by C-G formula based on SCr of 1.87 mg/dL (H)).  Coagulation Profile: Recent Labs  Lab 12/16/20 1547  INR 1.1   [place] Francisco N Madellyn Denio  D.O. [facility]  If 7PM-7AM, please contact overnight-coverage provider If 7AM-7PM, please contact day coverage provider [email or web address]  12/16/2020, 10:30 PM

## 2020-12-16 NOTE — ED Triage Notes (Signed)
Patient reported via EMS from [facility]. [place] approximately a month ago, fracture in left ankle. Pt sent home in boot. Fell April 13 and bone displaced in ankle. Sent to be admitted for surgery.

## 2020-12-16 NOTE — ED Provider Notes (Signed)
Christopher [name]  ____________________________________________   Event Date/Time   First MD Initiated Contact with Patient 12/16/20 1506     (approximate)  I have reviewed the triage vital signs and the nursing notes.   HISTORY  Chief Complaint Ankle Pain    HPI Christopher [name] is a 70 y.o. male here with left ankle pain.  The patient recently sustained a fracture of his left foot at the end of March.  He had imaging at that time which showed a Lisfranc injury.  He was placed in a boot and nonoperative management was attempted.  Since then, he fell again on 4/13 and has subsequently had progressively worsening pain and swelling of his left ankle.  He went to podiatry today and was sent here for admission for operation likely in the morning.  Patient is otherwise without complaints.  Denies any recent changes in his health.  He denies any recent shortness of breath, cough.  No known COVID exposures.  The pain is 2 out of 10 at rest but 10 out of 10 with any kind of movement or palpation.  No alleviating factors.  No distal numbness or weakness.   No proximal leg swellings or signs of DVT.  No history of DVT       No past medical history on file.  There are no problems to display for this patient.    Prior to Admission medications   Medication Sig Start Date End Date Taking? Authorizing Provider  oxyCODONE-acetaminophen (PERCOCET/ROXICET) 5-325 MG tablet Take 1 tablet by mouth every 4 (four) hours as needed for severe pain. 11/05/20   Paulette Blanch, MD    Allergies Patient has no allergy information on record.  No family history on file.  Social History    Review of Systems  Review of Systems  Constitutional: Negative for chills and fever.  HENT: Negative for sore throat.   Respiratory: Negative for shortness of breath.   Cardiovascular: Negative for chest pain.  Gastrointestinal: Negative for abdominal pain.   Genitourinary: Negative for flank pain.  Musculoskeletal: Positive for arthralgias and gait problem. Negative for neck pain.  Skin: Negative for rash and wound.  Allergic/Immunologic: Negative for immunocompromised state.  Neurological: Positive for weakness. Negative for numbness.  Hematological: Does not bruise/bleed easily.  All other systems reviewed and are negative.    ____________________________________________  PHYSICAL EXAM:      VITAL SIGNS: ED Triage Vitals  Enc Vitals Group     BP 12/16/20 1504 107/69     Pulse Rate 12/16/20 1504 82     Resp 12/16/20 1504 14     Temp 12/16/20 1504 98.3 F (36.8 C)     Temp Source 12/16/20 1504 Oral     SpO2 12/16/20 1504 92 %     Weight 12/16/20 1505 (!) 370 lb (167.8 kg)     Height 12/16/20 1505 6' (1.829 m)     Head Circumference --      Peak Flow --      Pain Score 12/16/20 1504 4     Pain Loc --      Pain Edu? --      Excl. in [place]? --      Physical Exam Vitals and nursing [name] reviewed.  Constitutional:      General: He is not in acute distress.    Appearance: He is well-developed.  HENT:     Head: Normocephalic and atraumatic.  Eyes:  Conjunctiva/sclera: Conjunctivae normal.  Cardiovascular:     Rate and Rhythm: Normal rate and regular rhythm.     Heart sounds: Normal heart sounds. No murmur heard. No friction rub.  Pulmonary:     Effort: Pulmonary effort is normal. No respiratory distress.     Breath sounds: Normal breath sounds. No wheezing or rales.  Abdominal:     General: There is no distension.     Palpations: Abdomen is soft.     Tenderness: There is no abdominal tenderness.  Musculoskeletal:     Cervical back: Neck supple.  Skin:    General: Skin is warm.     Capillary Refill: Capillary refill takes less than 2 seconds.  Neurological:     Mental Status: He is alert and oriented to person, place, and time.     Motor: No abnormal muscle tone.      LOWER EXTREMITY EXAM: LEFT  INSPECTION &  PALPATION: Boot in place. Moderate TTP diffusely about the ankle. No open wounds.  ____________________________________________   LABS (all labs ordered are listed, but only abnormal results are displayed)  Labs Reviewed  CBC WITH DIFFERENTIAL/PLATELET - Abnormal; Notable for the following components:      Result Value   Abs Immature Granulocytes 0.09 (*)    All other components within normal limits  RESP PANEL BY RT-PCR (FLU A&B, COVID) ARPGX2  BASIC METABOLIC PANEL  PROTIME-INR  BRAIN NATRIURETIC PEPTIDE  TYPE AND SCREEN  TROPONIN I (HIGH SENSITIVITY)    ____________________________________________  EKG: Normal sinus rhythm, ventricular to 85.  PR 186, QRS 111, QTc 467.  No acute ST elevations or depressions.  No EKG evidence of acute ischemia or infarct. ________________________________________  RADIOLOGY All imaging, including plain films, CT scans, and ultrasounds, independently reviewed by me, and interpretations confirmed via formal radiology reads.  ED MD interpretation:   Chest x-ray: No active disease  Official radiology report(s): DG Chest Portable 1 View  Result Date: 12/16/2020 CLINICAL DATA:  Preoperative examination. Patient for fixation of left foot fractures. EXAM: PORTABLE CHEST 1 VIEW COMPARISON:  None. FINDINGS: Lungs are clear. Heart size is normal. No pneumothorax or pleural fluid. No acute or focal bony abnormality. IMPRESSION: No acute disease. Electronically Signed   By: Inge Rise M.D.   On: 12/16/2020 16:20    ____________________________________________  PROCEDURES   Procedure(s) performed (including Critical Care):  Procedures  ____________________________________________  INITIAL IMPRESSION / MDM / [place] / ED COURSE  As part of my medical decision making, I reviewed the following data within the [facility] notes reviewed and incorporated, Old chart reviewed, Notes from prior ED visits, and [place]  Controlled Substance Database       *Christopher [name] was evaluated in Emergency Department on 12/16/2020 for the symptoms described in the history of present illness. He was evaluated in the context of the global COVID-19 pandemic, which necessitated consideration that the patient might be at risk for infection with the SARS-CoV-2 virus that causes COVID-19. Institutional protocols and algorithms that pertain to the evaluation of patients at risk for COVID-19 are in a state of rapid change based on information released by regulatory bodies including the CDC and federal and state organizations. These policies and algorithms were followed during the patient's care in the ED.  Some ED evaluations and interventions may be delayed as a result of limited staffing during the pandemic.*     Medical Decision Making: 70 year old male here with worsening left ankle dislocation and Lisfranc  injury.  Sent here by podiatry for medical admission and optimization prior to surgery tomorrow.  Patient is hemodynamically stable.  He does have significant underlying disease including morbid obesity, sleep apnea, CHF, CKD.  No recent illness.  Lab work thus far appears to be at baseline.  No leukocytosis or anemia.  Chest x-ray is clear.  EKG nonischemic.  Admit to medicine.  ____________________________________________  FINAL CLINICAL IMPRESSION(S) / ED DIAGNOSES  Final diagnoses:  Ankle dislocation, left, sequela     MEDICATIONS GIVEN DURING THIS VISIT:  Medications  oxyCODONE-acetaminophen (PERCOCET/ROXICET) 5-325 MG per tablet 1-2 tablet (has no administration in time range)     ED Discharge Orders    None       [name]:  This document was prepared using Dragon voice recognition software and may include unintentional dictation errors.   Duffy Bruce, MD 12/16/20 [phone]

## 2020-12-16 NOTE — Consult Note (Signed)
ORTHOPAEDIC CONSULTATION  REQUESTING PHYSICIAN: Duffy Bruce, MD  Chief Complaint: Left ankle fracture and foot fracture  HPI: Christopher [name] is a 70 y.o. male who complains of left ankle and foot fracture.  Patient was seen by myself in the outpatient clinic earlier today.  Approximately 6 weeks ago he sustained an ankle and foot fracture.  Was treated nonoperatively by myself.  He was placed in a boot and had been minimally weightbearing.  Unfortunately about 2 weeks ago he states he was transitioning from the wheelchair to the bathroom and fell.  He injured his left foot.  Since that time he has noticed his left foot is now abducted and pointing laterally.  X-rays in the outpatient clinic show displacement of the medial malleolus fracture with syndesmotic displacement and lateral shift of the talus to the tibia.  The foot x-rays look to show stability without further displacement in the midfoot region.  Past medical history COPD CHF Chronic kidney disease     Social History   Socioeconomic History  . Marital status: Married    Spouse name: Not on file  . Number of children: Not on file  . Years of education: Not on file  . Highest education level: Not on file  Occupational History  . Not on file  Tobacco Use  . Smoking status: Not on file  . Smokeless tobacco: Not on file  Substance and Sexual Activity  . Alcohol use: Not on file  . Drug use: Not on file  . Sexual activity: Not on file  Other Topics Concern  . Not on file  Social History Narrative  . Not on file   Social Determinants of Health   Financial Resource Strain: Not on file  Food Insecurity: Not on file  Transportation Needs: Not on file  Physical Activity: Not on file  Stress: Not on file  Social Connections: Not on file   No family history on file. Not on File Prior to Admission medications   Medication Sig Start Date End Date Taking? Authorizing Provider  oxyCODONE-acetaminophen  (PERCOCET/ROXICET) 5-325 MG tablet Take 1 tablet by mouth every 4 (four) hours as needed for severe pain. 11/05/20   Paulette Blanch, MD   DG Chest Portable 1 View  Result Date: 12/16/2020 CLINICAL DATA:  Preoperative examination. Patient for fixation of left foot fractures. EXAM: PORTABLE CHEST 1 VIEW COMPARISON:  None. FINDINGS: Lungs are clear. Heart size is normal. No pneumothorax or pleural fluid. No acute or focal bony abnormality. IMPRESSION: No acute disease. Electronically Signed   By: Inge Rise M.D.   On: 12/16/2020 16:20    Positive ROS: All other systems have been reviewed and were otherwise negative with the exception of those mentioned in the HPI and as above.  12 point ROS was performed.  Physical Exam: General: Alert and oriented.  No apparent distress.  Vascular:  Left foot:Dorsalis Pedis:  diminished Posterior Tibial:  diminished secondary to edema  Right foot: Not evaluated today  Neuro:intact  Derm: No open skin breaks or lacerations to the left foot.  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  Ortho/MS: Definite bilateral lower extremity edema.  The left foot is in an abducted position compared to the right side.  There is some instability and pain with palpation to the medial malleoli region.  The midtarsal region seems to be stable.  X-rays in the outpatient setting show displaced medial malleolar fracture with syndesmotic displacement.  Foot shows good stability and  alignment in the midtarsal region for now.  Reviewed previous CT and x-rays from the ER.  It showed the medial malleolus fracture but this was anatomic at the time of his injury.  There was also a posterior malleolus fracture.  Assessment: Displaced left ankle fracture Lisfranc fracture left foot History of COPD CHF and chronic kidney disease  Plan: I discussed the need for surgical intervention for realignment and stabilization of this ankle fracture.  He has  had a lot of difficulty maintaining nonweightbearing status.  He is morbidly obese and tries to move around in a wheelchair but still is attempting to ambulate and bear weight on his left foot.  I discussed the need for strict nonweightbearing to this left side postoperatively.  We will ask for physical therapy evaluation especially after the surgery to evaluate for nonweightbearing status.  Orders written for surgery for tomorrow afternoon.  I discussed the surgery in great detail with the patient.  The plan is for ORIF of the ankle and possible percutaneous stabilization of the midtarsal region if there is any instability under fluoroscopy.  Patient expressed understanding.    Elesa Hacker, DPM Cell [phone]   12/16/2020 5:07 PM

## 2020-12-16 NOTE — Progress Notes (Signed)
Chaplain attempted to visit patient twice. On one attempt patient was on the phone. Second attempt Chaplain was waved off by person with patient. Chaplain returned and patient door was closed.

## 2020-12-16 NOTE — ED Notes (Signed)
Pt admitted to room 154 and placed on telemetry. Pts daughter at bedside and oriented to room  12/16/20 1853  Vitals  Temp 98.3 F (36.8 C)  BP (!) 147/110  MAP (mmHg) 120  BP Location Right Arm  BP Method Automatic  Patient Position (if appropriate) Lying  Pulse Rate 76  Pulse Rate Source Monitor  Level of Consciousness  Level of Consciousness Alert  MEWS COLOR  MEWS Score Color Green  Oxygen Therapy  SpO2 100 %  O2 Device Nasal Cannula  O2 Flow Rate (L/min) 2 L/min

## 2020-12-17 ENCOUNTER — Inpatient Hospital Stay: Payer: Medicare Other

## 2020-12-17 ENCOUNTER — Inpatient Hospital Stay: Payer: Medicare Other | Admitting: Certified Registered"

## 2020-12-17 ENCOUNTER — Encounter: Payer: Self-pay | Admitting: Internal Medicine

## 2020-12-17 ENCOUNTER — Encounter
Admission: EM | Disposition: A | Discharge status: Home / Self Care | Payer: Self-pay | Source: Home / Self Care | Attending: Hospitalist

## 2020-12-17 HISTORY — PX: ORIF ANKLE FRACTURE: SHX5408

## 2020-12-17 LAB — BASIC METABOLIC PANEL
Anion gap: 10 (ref 5–15)
BUN: 40 mg/dL — ABNORMAL HIGH (ref 8–23)
CO2: 34 mmol/L — ABNORMAL HIGH (ref 22–32)
Calcium: 8.8 mg/dL — ABNORMAL LOW (ref 8.9–10.3)
Chloride: 94 mmol/L — ABNORMAL LOW (ref 98–111)
Creatinine, Ser: 1.96 mg/dL — ABNORMAL HIGH (ref 0.61–1.24)
GFR, Estimated: 36 mL/min — ABNORMAL LOW (ref >60)
Glucose, Bld: 100 mg/dL — ABNORMAL HIGH (ref 70–99)
Potassium: 3.7 mmol/L (ref 3.5–5.1)
Sodium: 138 mmol/L (ref 135–145)

## 2020-12-17 LAB — CBC
HCT: 38.9 % — ABNORMAL LOW (ref 39.0–52.0)
Hemoglobin: 12.2 g/dL — ABNORMAL LOW (ref 13.0–17.0)
MCH: 28.2 pg (ref 26.0–34.0)
MCHC: 31.4 g/dL (ref 30.0–36.0)
MCV: 90 fL (ref 80.0–100.0)
Platelets: 204 10*3/uL (ref 150–400)
RBC: 4.32 MIL/uL (ref 4.22–5.81)
RDW: 14.5 % (ref 11.5–15.5)
WBC: 7.2 10*3/uL (ref 4.0–10.5)
nRBC: 0 % (ref 0.0–0.2)

## 2020-12-17 LAB — ABO/RH: ABO/RH(D): A POS

## 2020-12-17 SURGERY — OPEN REDUCTION INTERNAL FIXATION (ORIF) ANKLE FRACTURE
Anesthesia: General | Site: Ankle | Laterality: Left

## 2020-12-17 MED ORDER — ENOXAPARIN SODIUM 40 MG/0.4ML ~~LOC~~ SOLN
40.0000 mg | SUBCUTANEOUS | Status: DC
  Administered 2020-12-18: 40 mg via SUBCUTANEOUS
  Filled 2020-12-17: qty 0.4

## 2020-12-17 MED ORDER — PHENYLEPHRINE HCL (PRESSORS) 10 MG/ML IV SOLN
INTRAVENOUS | Status: DC | PRN
  Administered 2020-12-17 (×3): 100 ug via INTRAVENOUS

## 2020-12-17 MED ORDER — LACTATED RINGERS IV SOLN: INTRAVENOUS | Status: DC | PRN

## 2020-12-17 MED ORDER — SUGAMMADEX SODIUM 500 MG/5ML IV SOLN
INTRAVENOUS | Status: AC
  Filled 2020-12-17: qty 5

## 2020-12-17 MED ORDER — DEXMEDETOMIDINE (PRECEDEX) IN NS 20 MCG/5ML (4 MCG/ML) IV SYRINGE
PREFILLED_SYRINGE | INTRAVENOUS | Status: AC
  Filled 2020-12-17: qty 5

## 2020-12-17 MED ORDER — OXYCODONE HCL 5 MG PO TABS: 5.0000 mg | ORAL_TABLET | Freq: Once | ORAL | Status: DC | PRN

## 2020-12-17 MED ORDER — PROMETHAZINE HCL 25 MG/ML IJ SOLN: 6.2500 mg | INTRAMUSCULAR | Status: DC | PRN

## 2020-12-17 MED ORDER — ACETAMINOPHEN 10 MG/ML IV SOLN
INTRAVENOUS | Status: DC | PRN
  Administered 2020-12-17: 1000 mg via INTRAVENOUS

## 2020-12-17 MED ORDER — ROCURONIUM BROMIDE 100 MG/10ML IV SOLN
INTRAVENOUS | Status: DC | PRN
  Administered 2020-12-17: 50 mg via INTRAVENOUS
  Administered 2020-12-17 (×2): 20 mg via INTRAVENOUS

## 2020-12-17 MED ORDER — DEXAMETHASONE SODIUM PHOSPHATE 10 MG/ML IJ SOLN
INTRAMUSCULAR | Status: DC | PRN
  Administered 2020-12-17: 10 mg via INTRAVENOUS

## 2020-12-17 MED ORDER — ACETAMINOPHEN 10 MG/ML IV SOLN
INTRAVENOUS | Status: AC
  Filled 2020-12-17: qty 100

## 2020-12-17 MED ORDER — DEXMEDETOMIDINE (PRECEDEX) IN NS 20 MCG/5ML (4 MCG/ML) IV SYRINGE
PREFILLED_SYRINGE | INTRAVENOUS | Status: DC | PRN
Start: 2020-12-17 — End: 2020-12-17
  Administered 2020-12-17: 8 ug via INTRAVENOUS
  Administered 2020-12-17: 4 ug via INTRAVENOUS

## 2020-12-17 MED ORDER — FENTANYL CITRATE (PF) 100 MCG/2ML IJ SOLN
INTRAMUSCULAR | Status: AC
  Filled 2020-12-17: qty 2

## 2020-12-17 MED ORDER — SUGAMMADEX SODIUM 500 MG/5ML IV SOLN
INTRAVENOUS | Status: DC | PRN
  Administered 2020-12-17: 350 mg via INTRAVENOUS

## 2020-12-17 MED ORDER — BUPIVACAINE LIPOSOME 1.3 % IJ SUSP
INTRAMUSCULAR | Status: AC
  Filled 2020-12-17: qty 20

## 2020-12-17 MED ORDER — ONDANSETRON HCL 4 MG/2ML IJ SOLN
INTRAMUSCULAR | Status: DC | PRN
  Administered 2020-12-17: 4 mg via INTRAVENOUS

## 2020-12-17 MED ORDER — BUPIVACAINE HCL (PF) 0.5 % IJ SOLN
INTRAMUSCULAR | Status: DC | PRN
  Administered 2020-12-17 (×2): 10 mL

## 2020-12-17 MED ORDER — MEPERIDINE HCL 50 MG/ML IJ SOLN: 6.2500 mg | INTRAMUSCULAR | Status: DC | PRN

## 2020-12-17 MED ORDER — PROPOFOL 10 MG/ML IV BOLUS
INTRAVENOUS | Status: DC | PRN
  Administered 2020-12-17: 170 mg via INTRAVENOUS

## 2020-12-17 MED ORDER — ONDANSETRON HCL 4 MG/2ML IJ SOLN
INTRAMUSCULAR | Status: AC
  Filled 2020-12-17: qty 2

## 2020-12-17 MED ORDER — SUCCINYLCHOLINE CHLORIDE 20 MG/ML IJ SOLN
INTRAMUSCULAR | Status: DC | PRN
  Administered 2020-12-17: 200 mg via INTRAVENOUS

## 2020-12-17 MED ORDER — EPHEDRINE 5 MG/ML INJ
INTRAVENOUS | Status: AC
  Filled 2020-12-17: qty 10

## 2020-12-17 MED ORDER — BUPIVACAINE LIPOSOME 1.3 % IJ SUSP
INTRAMUSCULAR | Status: DC | PRN
  Administered 2020-12-17 (×2): 10 mL

## 2020-12-17 MED ORDER — OXYCODONE-ACETAMINOPHEN 5-325 MG PO TABS
1.0000 | ORAL_TABLET | Freq: Four times a day (QID) | ORAL | Status: DC | PRN
  Administered 2020-12-17: 2 via ORAL
  Administered 2020-12-18 – 2020-12-19 (×2): 1 via ORAL
  Filled 2020-12-17 (×2): qty 1
  Filled 2020-12-17: qty 2

## 2020-12-17 MED ORDER — LIDOCAINE HCL (PF) 1 % IJ SOLN
INTRAMUSCULAR | Status: AC
  Filled 2020-12-17: qty 30

## 2020-12-17 MED ORDER — FENTANYL CITRATE (PF) 100 MCG/2ML IJ SOLN
INTRAMUSCULAR | Status: DC | PRN
  Administered 2020-12-17: 50 ug via INTRAVENOUS
  Administered 2020-12-17 (×2): 25 ug via INTRAVENOUS

## 2020-12-17 MED ORDER — LIDOCAINE HCL (CARDIAC) PF 100 MG/5ML IV SOSY
PREFILLED_SYRINGE | INTRAVENOUS | Status: DC | PRN
  Administered 2020-12-17: 100 mg via INTRAVENOUS

## 2020-12-17 MED ORDER — LIDOCAINE HCL (PF) 2 % IJ SOLN
INTRAMUSCULAR | Status: AC
  Filled 2020-12-17: qty 5

## 2020-12-17 MED ORDER — OXYCODONE HCL 5 MG/5ML PO SOLN: 5.0000 mg | Freq: Once | ORAL | Status: DC | PRN

## 2020-12-17 MED ORDER — MORPHINE SULFATE (PF) 2 MG/ML IV SOLN
1.0000 mg | INTRAVENOUS | Status: DC | PRN
  Administered 2020-12-17: 1 mg via INTRAVENOUS
  Filled 2020-12-17: qty 1

## 2020-12-17 MED ORDER — FENTANYL CITRATE (PF) 100 MCG/2ML IJ SOLN
INTRAMUSCULAR | Status: AC
  Administered 2020-12-17: 50 ug via INTRAVENOUS
  Filled 2020-12-17: qty 2

## 2020-12-17 MED ORDER — FENTANYL CITRATE (PF) 100 MCG/2ML IJ SOLN
25.0000 ug | INTRAMUSCULAR | Status: DC | PRN
  Administered 2020-12-17 (×2): 25 ug via INTRAVENOUS

## 2020-12-17 MED ORDER — BUPIVACAINE HCL (PF) 0.5 % IJ SOLN
INTRAMUSCULAR | Status: AC
  Filled 2020-12-17: qty 30

## 2020-12-17 MED ORDER — PROPOFOL 10 MG/ML IV BOLUS
INTRAVENOUS | Status: AC
  Filled 2020-12-17: qty 40

## 2020-12-17 MED ORDER — SUCCINYLCHOLINE CHLORIDE 200 MG/10ML IV SOSY
PREFILLED_SYRINGE | INTRAVENOUS | Status: AC
  Filled 2020-12-17: qty 10

## 2020-12-17 SURGICAL SUPPLY — 63 items
BIT DRILL 2.4X140 LONG SOLID (BIT) ×2 IMPLANT
BLADE SURG 15 STRL LF DISP TIS (BLADE) IMPLANT
BLADE SURG 15 STRL SS (BLADE)
BNDG COHESIVE 4X5 TAN STRL (GAUZE/BANDAGES/DRESSINGS) ×2 IMPLANT
BNDG CONFORM 2 STRL LF (GAUZE/BANDAGES/DRESSINGS) IMPLANT
BNDG CONFORM 3 STRL LF (GAUZE/BANDAGES/DRESSINGS) ×2 IMPLANT
BNDG ELASTIC 4X5.8 VLCR NS LF (GAUZE/BANDAGES/DRESSINGS) ×2 IMPLANT
BNDG ESMARK 4X12 TAN STRL LF (GAUZE/BANDAGES/DRESSINGS) ×2 IMPLANT
BNDG GAUZE 4.5X4.1 6PLY STRL (MISCELLANEOUS) ×2 IMPLANT
CANISTER SUCT 1200ML W/VALVE (MISCELLANEOUS) IMPLANT
COVER WAND RF STERILE (DRAPES) ×2 IMPLANT
CUFF TOURN SGL QUICK 18X4 (TOURNIQUET CUFF) IMPLANT
CUFF TOURN SGL QUICK 24 (TOURNIQUET CUFF)
CUFF TOURN SGL QUICK 34 (TOURNIQUET CUFF) ×1
CUFF TRNQT CYL 24X4X16.5-23 (TOURNIQUET CUFF) IMPLANT
CUFF TRNQT CYL 34X4.125X (TOURNIQUET CUFF) ×1 IMPLANT
DRAPE C-ARM XRAY 36X54 (DRAPES) ×2 IMPLANT
DRAPE C-ARMOR (DRAPES) IMPLANT
DURAPREP 26ML APPLICATOR (WOUND CARE) ×4 IMPLANT
ELECT REM PT RETURN 9FT ADLT (ELECTROSURGICAL) ×2
ELECTRODE REM PT RTRN 9FT ADLT (ELECTROSURGICAL) ×1 IMPLANT
GAUZE SPONGE 4X4 12PLY STRL (GAUZE/BANDAGES/DRESSINGS) ×2 IMPLANT
GAUZE XEROFORM 1X8 LF (GAUZE/BANDAGES/DRESSINGS) ×2 IMPLANT
GLOVE SURG ENC MOIS LTX SZ7.5 (GLOVE) ×2 IMPLANT
GLOVE SURG UNDER LTX SZ8 (GLOVE) ×2 IMPLANT
GOWN STRL REUS W/ TWL XL LVL3 (GOWN DISPOSABLE) ×3 IMPLANT
GOWN STRL REUS W/TWL XL LVL3 (GOWN DISPOSABLE) ×3
K-WIRE SMOOTH TROCAR 2.0X150 (WIRE) ×4
KIT TURNOVER KIT A (KITS) ×2 IMPLANT
KWIRE SMOOTH TROCAR 2.0X150 (WIRE) ×2 IMPLANT
LABEL OR SOLS (LABEL) ×2 IMPLANT
MANIFOLD NEPTUNE II (INSTRUMENTS) ×2 IMPLANT
NEEDLE HYPO 22GX1.5 SAFETY (NEEDLE) ×2 IMPLANT
NS IRRIG 500ML POUR BTL (IV SOLUTION) ×2 IMPLANT
PACK EXTREMITY ARMC (MISCELLANEOUS) ×2 IMPLANT
PAD PREP 24X41 OB/GYN DISP (PERSONAL CARE ITEMS) ×2 IMPLANT
PLATE FIBULA HOOK 5 HOLE (Plate) ×2 IMPLANT
PUTTY DBX 1CC (Putty) ×2 IMPLANT
PUTTY DBX 1CC DEPUY (Putty) ×1 IMPLANT
SCREW LOCK 3 3.5X18 (Screw) ×2 IMPLANT
SCREW LOCK PLATE R3 3.5X12 (Screw) ×2 IMPLANT
SCREW LOCK PLATE R3 3.5X14 (Screw) ×2 IMPLANT
SCREW LOCK PLATE R3 3.5X36 (Screw) ×1 IMPLANT
SCREW LOCK PLT 36X3.5X GRLL (Screw) ×1 IMPLANT
SCREW R3CON NONLOCK 3.5X38 (Screw) ×2 IMPLANT
SPLINT CAST 1 STEP 4X30 (MISCELLANEOUS) ×2 IMPLANT
SPLINT FAST PLASTER 5X30 (CAST SUPPLIES) ×1
SPLINT PLASTER CAST FAST 5X30 (CAST SUPPLIES) ×1 IMPLANT
SPONGE LAP 18X18 RF (DISPOSABLE) ×2 IMPLANT
STAPLER SKIN PROX 35W (STAPLE) ×2 IMPLANT
STOCKINETTE M/LG 89821 (MISCELLANEOUS) ×2 IMPLANT
STRAP SAFETY 5IN WIDE (MISCELLANEOUS) ×2 IMPLANT
STRIP CLOSURE SKIN 1/2X4 (GAUZE/BANDAGES/DRESSINGS) ×2 IMPLANT
SUCTION FRAZIER HANDLE 10FR (MISCELLANEOUS) ×1
SUCTION TUBE FRAZIER 10FR DISP (MISCELLANEOUS) ×1 IMPLANT
SUT VIC AB 2-0 CT1 27 (SUTURE) ×1
SUT VIC AB 2-0 CT1 TAPERPNT 27 (SUTURE) ×1 IMPLANT
SUT VIC AB 3-0 SH 27 (SUTURE) ×1
SUT VIC AB 3-0 SH 27X BRD (SUTURE) ×1 IMPLANT
SWABSTK COMLB BENZOIN TINCTURE (MISCELLANEOUS) ×2 IMPLANT
SYR 10ML LL (SYRINGE) ×2 IMPLANT
SYR 50ML LL SCALE MARK (SYRINGE) ×2 IMPLANT
WIRE OLIVE SMOOTH 1.4MMX60MM (WIRE) ×4 IMPLANT

## 2020-12-17 NOTE — Anesthesia Postprocedure Evaluation (Signed)
Anesthesia Post Note  Patient: Christopher [name]  Procedure(s) Performed: OPEN REDUCTION INTERNAL FIXATION (ORIF) ANKLE FRACTURE (Left Ankle)  Patient location during evaluation: PACU Anesthesia Type: General Level of consciousness: awake and alert Pain management: pain level controlled Vital Signs Assessment: post-procedure vital signs reviewed and stable Respiratory status: spontaneous breathing, nonlabored ventilation, respiratory function stable and patient connected to nasal cannula oxygen Cardiovascular status: blood pressure returned to baseline and stable Postop Assessment: no apparent nausea or vomiting Anesthetic complications: no   No complications documented.   Last Vitals:  Vitals:   12/17/20 1730 12/17/20 1745  BP: (!) 110/59 93/71  Pulse: 74 69  Resp: 17 18  Temp:    SpO2: 98% 97%    Last Pain:  Vitals:   12/17/20 1745  TempSrc:   PainSc: 2                  Arita Miss

## 2020-12-17 NOTE — Op Note (Signed)
Operative note   Surgeon:Jaicey Sweaney Lawyer: None    Preop diagnosis: Displaced medial malleolar fracture    Postop diagnosis: Same    Procedure: Open reduction internal fixation left medial malleolus fracture    EBL: Minimal    Anesthesia:local and general.  Local consisted of a one-to-one mixture of 0.5% bupivacaine and Exparel long-acting anesthetic    Hemostasis: Thigh tourniquet inflated to 250 mmHg for approximately 70 minutes    Specimen: None    Complications: None    Operative indications:Christopher [name] is an 70 y.o. that presents today for surgical intervention.  The risks/benefits/alternatives/complications have been discussed and consent has been given.    Procedure:  Patient was brought into the OR and placed on the operating table in thesupine position. After anesthesia was obtained theleft lower extremity was prepped and draped in usual sterile fashion.  Attention was directed to the medial aspect of the ankle where a medial incision was performed overlying the medial malleoli region.  Sharp and blunt dissection were carried down to the deep tissue.  Subperiosteal dissection was then performed.  At this time there was noted to be fibrotic tissue within a nonunion of a medial malleolus fracture.  The fracture was displaced distal and medial.  Dissection was carried out and all fibrotic tissue was removed from the fracture site.  This was taken down to good healthy bleeding bone.  Reduction was then performed and this was stabilized with two 2.0 mm K wires.  Good reduction was noted on fluoroscopy.  At this time a medial hook plate was then placed along the medial malleolus.  This was pressed flush against the distal medial tibia and good realignment was noted of the ankle joint mortise.  The remaining holes were then filled with 3.5 mm locking screws.  The most proximal screw was a nonlocking screw.  Excellent realignment and stability was noted.  At this time stress  to the syndesmosis was performed.  There was no signs of diastases in the syndesmosis region.  Finally evaluation of the foot where previous Lisfranc fracture had been diagnosed was stressed and no further diastases was noted in this area.  The wounds were then flushed with copious amounts of irrigation.  Closure was performed with a 2-0 Vicryl and 3-0 Vicryl and skin staples for the skin.  Patient was placed in a posterior splint with a bulky dressing.  He will be readmitted to the floor for further evaluation.  We will ask physical therapy to evaluate to assist with nonweightbearing modalities.  Patient has had difficulty remaining nonweightbearing.    Patient tolerated the procedure and anesthesia well.  Was transported from the OR to the PACU with all vital signs stable and vascular status intact. To be discharged per routine protocol.  Will follow up in approximately 1 week in the outpatient clinic.

## 2020-12-17 NOTE — Anesthesia Procedure Notes (Signed)
Procedure Name: Intubation Date/Time: 12/17/2020 3:08 PM Performed by: Lerry Liner, CRNA Pre-anesthesia Checklist: Patient identified, Emergency Drugs available, Suction available and Patient being monitored Patient Re-evaluated:Patient Re-evaluated prior to induction Oxygen Delivery Method: Circle system utilized Preoxygenation: Pre-oxygenation with 100% oxygen Induction Type: IV induction Ventilation: Mask ventilation without difficulty Laryngoscope Size: McGraph and 4 Grade View: Grade I Tube type: Oral Number of attempts: 1 Airway Equipment and Method: Stylet and Oral airway Placement Confirmation: ETT inserted through vocal cords under direct vision,  positive ETCO2 and breath sounds checked- equal and bilateral Secured at: 23 cm Tube secured with: Tape Dental Injury: Teeth and Oropharynx as per pre-operative assessment

## 2020-12-17 NOTE — Transfer of Care (Signed)
Immediate Anesthesia Transfer of Care Note  Patient: Christopher [name]  Procedure(s) Performed: OPEN REDUCTION INTERNAL FIXATION (ORIF) ANKLE FRACTURE (Left Ankle)  Patient Location: PACU  Anesthesia Type:General  Level of Consciousness: drowsy  Airway & Oxygen Therapy: Patient Spontanous Breathing and Patient connected to face mask oxygen  Post-op Assessment: Report given to RN  Post vital signs: stable  Last Vitals:  Vitals Value Taken Time  BP    Temp    Pulse 73 12/17/20 1658  Resp 19 12/17/20 1658  SpO2 100 % 12/17/20 1658  Vitals shown include unvalidated device data.  Last Pain:  Vitals:   12/17/20 1434  TempSrc: Temporal  PainSc:          Complications: No complications documented.

## 2020-12-17 NOTE — Progress Notes (Signed)
PROGRESS NOTE    Christopher [name]  [id number] DOB: [birth year] DOA: 12/16/2020 PCP: Rusty Aus, MD  [id number]/[id number]-AA   Assessment & Plan:   Principal Problem:   Closed left ankle fracture Active Problems:   OSA on CPAP   Morbid obesity with BMI of 50.0-59.9, adult (HCC)   Hyperlipidemia, mixed   Depression   Hypothyroid   Chronic diastolic CHF (congestive heart failure) ([place])   Christopher [name] is a 70 y.o. male with medical history significant for hypertension, heart failure preserved ejection fraction, OSA on CPAP, morbid obesity, hypothyroid, depression, presents to the emergency department for chief concerns of left ankle fracture.  # Closed left ankle fracture - Secondary to mechanical fall x 2  Plan: --ORIF today with Dr. 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  Tolerated it well.  No pain immediately after the surgery.   Objective: Vitals:   12/17/20 1745 12/17/20 1800 12/17/20 1821 12/17/20 2017  BP: 93/71 118/73 112/63 111/66  Pulse: 69 71 68 79  Resp: '18 11 16 16  '$ Temp:   97.6 F (36.4 C) 97.6 F (36.4 C)  TempSrc:      SpO2: 97% 98% 97% 98%  Weight:      Height:        Intake/Output Summary  (Last 24 hours) at 12/17/2020 2301 Last data filed at 12/17/2020 2300 Gross per 24 hour  Intake 600 ml  Output 1955 ml  Net -1355 ml   Filed Weights   12/16/20 1505 12/17/20 1434  Weight: (!) 167.8 kg (!) 167.8 kg    Examination:   Constitutional: NAD, AAOx3 HEENT: conjunctivae and lids normal, EOMI CV: No cyanosis.   RESP: normal respiratory effort Extremities: left ankle in cast SKIN: warm, dry Neuro: II - XII grossly intact.   Psych: Normal mood and affect.  Appropriate judgement and reason   Data Reviewed: I have personally reviewed following labs and imaging studies  CBC: Recent Labs  Lab 12/16/20 1547 12/17/20 0555  WBC 8.9 7.2  NEUTROABS 6.6  --   HGB 13.3 12.2*  HCT 41.5 38.9*  MCV 90.0 90.0  PLT 225 0000000   Basic Metabolic Panel: Recent Labs  Lab 12/16/20 1547 12/17/20 0555  NA 135 138  K 3.8 3.7  CL 92* 94*  CO2 33* 34*  GLUCOSE 95 100*  BUN 40* 40*  CREATININE 1.87* 1.96*  CALCIUM 9.2 8.8*   GFR: Estimated Creatinine Clearance: 56.4 mL/min (A) (by C-G formula based on SCr of 1.96 mg/dL (H)). Liver Function Tests: No results for input(s): AST, ALT, ALKPHOS, BILITOT, PROT, ALBUMIN in the last 168 hours. No results for input(s): LIPASE, AMYLASE in the last 168 hours. No results for input(s): AMMONIA in the last 168 hours. Coagulation Profile: Recent Labs  Lab 12/16/20 1547  INR 1.1   Cardiac Enzymes: No results for input(s): CKTOTAL, CKMB, CKMBINDEX, TROPONINI in the last 168 hours. BNP (last 3 results) No results for input(s): PROBNP in the last 8760 hours. HbA1C: No results for input(s): HGBA1C in the last 72 hours. CBG: No results for input(s): GLUCAP in the last 168 hours. Lipid Profile: No results for input(s): CHOL, HDL, LDLCALC, TRIG, CHOLHDL, LDLDIRECT in the last 72 hours. Thyroid Function Tests: No results for input(s): TSH, T4TOTAL, FREET4, T3FREE, THYROIDAB in the last 72 hours. Anemia Panel: No results for input(s):  VITAMINB12, FOLATE, FERRITIN, TIBC, IRON, RETICCTPCT in the last 72 hours. Sepsis Labs: No results for input(s): PROCALCITON, LATICACIDVEN in the last 168 hours.  Recent Results (from the past 240 hour(s))  Resp Panel by RT-PCR (Flu A&B, Covid) Nasopharyngeal Swab     Status: None   Collection Time: 12/16/20  4:05 PM   Specimen: Nasopharyngeal Swab; Nasopharyngeal(NP) swabs in vial transport medium  Result Value Ref Range Status   SARS Coronavirus 2 by RT PCR NEGATIVE NEGATIVE Final    Comment: (NOTE) SARS-CoV-2 target nucleic acids are NOT DETECTED.  The SARS-CoV-2 RNA is generally detectable in upper respiratory specimens during the acute phase of infection. The lowest concentration of SARS-CoV-2 viral copies this assay can detect is 138 copies/mL. A negative result does not preclude SARS-Cov-2 infection and should not be used as the sole basis for treatment or other patient management decisions. A negative result may occur with  improper specimen collection/handling, submission of specimen other than nasopharyngeal swab, presence of viral mutation(s) within the areas targeted by this assay, and inadequate number of viral copies(<138 copies/mL). A negative result must be combined with clinical observations, patient history, and epidemiological information. The expected result is Negative.  Fact Sheet for Patients:  EntrepreneurPulse.com.au  Fact Sheet for Healthcare Providers:  IncredibleEmployment.be  This test is no t yet approved or cleared by the [place] FDA and  has been authorized for detection and/or diagnosis of SARS-CoV-2 by FDA under an Emergency Use Authorization (EUA). This EUA will remain  in effect (meaning this test can be used) for the duration of the COVID-19 declaration under Section 564(b)(1) of the Act, 21 U.S.C.section 360bbb-3(b)(1), unless the authorization is terminated  or revoked sooner.       Influenza A  by PCR NEGATIVE NEGATIVE Final   Influenza B by PCR NEGATIVE NEGATIVE Final    Comment: (NOTE) The Xpert Xpress SARS-CoV-2/FLU/RSV plus assay is intended as an aid in the diagnosis of influenza from Nasopharyngeal swab specimens and should not be used as a sole basis for treatment. Nasal washings and aspirates are unacceptable for Xpert Xpress SARS-CoV-2/FLU/RSV testing.  Fact Sheet for Patients: EntrepreneurPulse.com.au  Fact Sheet for Healthcare Providers: IncredibleEmployment.be  This test is not yet approved or cleared by the [place] FDA and has been authorized for detection and/or diagnosis of SARS-CoV-2 by FDA under an Emergency Use Authorization (EUA). This EUA will remain in effect (meaning this test can be used) for the duration of the COVID-19 declaration under Section 564(b)(1) of the Act, 21 U.S.C. section 360bbb-3(b)(1), unless the authorization is terminated or revoked.  Performed at [facility], [address], [place], [place] [address]       Radiology Studies: DG Chest Portable 1 View  Result Date: 12/16/2020 CLINICAL DATA:  Preoperative examination. Patient for fixation of left foot fractures. EXAM: PORTABLE CHEST 1 VIEW COMPARISON:  None. FINDINGS: Lungs are clear. Heart size is  normal. No pneumothorax or pleural fluid. No acute or focal bony abnormality. IMPRESSION: No acute disease. Electronically Signed   By: Inge Rise M.D.   On: 12/16/2020 16:20   DG MINI C-ARM IMAGE ONLY  Result Date: 12/17/2020 There is no interpretation for this exam.  This order is for images obtained during a surgical procedure.  Please See "Surgeries" Tab for more information regarding the procedure.     Scheduled Meds: . atorvastatin  20 mg Oral Daily  . buPROPion  150 mg Oral BID  . cholecalciferol  1,000 Units Oral Daily  . furosemide  40 mg Oral Daily  . gabapentin  300 mg Oral TID  . levothyroxine  88 mcg Oral  Daily  . metolazone  2.5 mg Oral Daily  . potassium chloride  10 mEq Oral BID   Continuous Infusions:   LOS: 1 day     Enzo Bi, MD [facility] If 7PM-7AM, please contact night-coverage 12/17/2020, 11:01 PM

## 2020-12-17 NOTE — Progress Notes (Signed)
PT Cancellation Note  Patient Details Name: Christopher [name] MRN: [id number] DOB: 1950-12-27   Cancelled Treatment:    Reason Eval/Treat Not Completed: Patient at procedure or test/unavailable Pt having surgery on ankle, per continuation of orders will plan on evaluating pt tomorrow.  Kreg Shropshire, DPT 12/17/2020, 3:37 PM

## 2020-12-17 NOTE — Anesthesia Preprocedure Evaluation (Signed)
Anesthesia Evaluation  Patient identified by MRN, date of birth, ID band Patient awake    Reviewed: Allergy & Precautions, NPO status , Patient's Chart, lab work & pertinent test results  History of Anesthesia Complications Negative for: history of anesthetic complications  Airway Mallampati: II  TM Distance: >3 FB Neck ROM: Full    Dental  (+) Poor Dentition, Missing   Pulmonary sleep apnea, Continuous Positive Airway Pressure Ventilation and Oxygen sleep apnea , neg COPD,    breath sounds clear to auscultation- rhonchi (-) wheezing      Cardiovascular Exercise Tolerance: Poor (-) hypertension+CHF (diastolic)  (-) CAD, (-) Past MI, (-) Cardiac Stents and (-) CABG  Rhythm:Regular Rate:Normal - Systolic murmurs and - Diastolic murmurs Echo 0000000: NORMAL LEFT VENTRICULAR SYSTOLIC FUNCTION  NORMAL RIGHT VENTRICULAR SYSTOLIC FUNCTION  TRIVIAL REGURGITATION NOTED  NO VALVULAR STENOSIS     Neuro/Psych neg Seizures PSYCHIATRIC DISORDERS Depression negative neurological ROS     GI/Hepatic negative GI ROS, Neg liver ROS,   Endo/Other  neg diabetesHypothyroidism   Renal/GU Renal InsufficiencyRenal disease     Musculoskeletal negative musculoskeletal ROS (+)   Abdominal (+) + obese,   Peds  Hematology negative hematology ROS (+)   Anesthesia Other Findings    Reproductive/Obstetrics                             Anesthesia Physical Anesthesia Plan  ASA: III  Anesthesia Plan: General   Post-op Pain Management:    Induction: Intravenous  PONV Risk Score and Plan: 1 and Ondansetron and Dexamethasone  Airway Management Planned: Oral ETT  Additional Equipment:   Intra-op Plan:   Post-operative Plan: Extubation in OR  Informed Consent: I have reviewed the patients History and Physical, chart, labs and discussed the procedure including the risks, benefits and alternatives for the  proposed anesthesia with the patient or authorized representative who has indicated his/her understanding and acceptance.     Dental advisory given  Plan Discussed with: CRNA and Anesthesiologist  Anesthesia Plan Comments:         Anesthesia Quick Evaluation

## 2020-12-18 ENCOUNTER — Encounter: Payer: Self-pay | Admitting: Podiatry

## 2020-12-18 LAB — CBC
HCT: 40.9 % (ref 39.0–52.0)
Hemoglobin: 13.2 g/dL (ref 13.0–17.0)
MCH: 28.8 pg (ref 26.0–34.0)
MCHC: 32.3 g/dL (ref 30.0–36.0)
MCV: 89.3 fL (ref 80.0–100.0)
Platelets: 226 10*3/uL (ref 150–400)
RBC: 4.58 MIL/uL (ref 4.22–5.81)
RDW: 13.9 % (ref 11.5–15.5)
WBC: 12.4 10*3/uL — ABNORMAL HIGH (ref 4.0–10.5)
nRBC: 0 % (ref 0.0–0.2)

## 2020-12-18 LAB — MAGNESIUM: Magnesium: 2 mg/dL (ref 1.7–2.4)

## 2020-12-18 LAB — BASIC METABOLIC PANEL
Anion gap: 14 (ref 5–15)
BUN: 37 mg/dL — ABNORMAL HIGH (ref 8–23)
CO2: 30 mmol/L (ref 22–32)
Calcium: 9 mg/dL (ref 8.9–10.3)
Chloride: 95 mmol/L — ABNORMAL LOW (ref 98–111)
Creatinine, Ser: 1.77 mg/dL — ABNORMAL HIGH (ref 0.61–1.24)
GFR, Estimated: 41 mL/min — ABNORMAL LOW (ref >60)
Glucose, Bld: 122 mg/dL — ABNORMAL HIGH (ref 70–99)
Potassium: 4.3 mmol/L (ref 3.5–5.1)
Sodium: 139 mmol/L (ref 135–145)

## 2020-12-18 MED ORDER — POLYETHYLENE GLYCOL 3350 17 G PO PACK: 17.0000 g | PACK | Freq: Every day | ORAL | Status: DC | PRN

## 2020-12-18 MED ORDER — ENOXAPARIN SODIUM 100 MG/ML IJ SOSY
0.5000 mg/kg | PREFILLED_SYRINGE | INTRAMUSCULAR | Status: DC
  Administered 2020-12-19 – 2020-12-22 (×4): 85 mg via SUBCUTANEOUS
  Filled 2020-12-18 (×4): qty 0.85

## 2020-12-18 MED ORDER — SENNOSIDES-DOCUSATE SODIUM 8.6-50 MG PO TABS
1.0000 | ORAL_TABLET | Freq: Every day | ORAL | Status: DC
  Administered 2020-12-18 – 2020-12-21 (×4): 1 via ORAL
  Filled 2020-12-18 (×4): qty 1

## 2020-12-18 NOTE — NC FL2 (Signed)
[place] LEVEL OF CARE SCREENING TOOL     IDENTIFICATION  Patient Name: Christopher [name] Birthdate: 1951/05/07 Sex: male Admission Date (Current Location): 12/16/2020  [place] and [place] Number:  Engineering geologist and Address:  [facility], [address], [place],  [address]      Provider Number: [id number]  Attending Physician Name and Address:  Enzo Bi, MD  Relative Name and Phone Number:  Kermit Balo wife [phone]    Current Level of Care: Hospital Recommended Level of Care: [place] Prior Approval Number:    Date Approved/Denied:   PASRR Number: [id number]  Discharge Plan: SNF    Current Diagnoses: Patient Active Problem List   Diagnosis Date Noted  . Osteomyelitis ([place]) 12/16/2020  . Closed left ankle fracture 12/16/2020  . OSA on CPAP 12/16/2020  . Morbid obesity with BMI of 50.0-59.9, adult ([place]) 12/16/2020  . Hyperlipidemia, mixed 12/16/2020  . Depression 12/16/2020  . Hypothyroid 12/16/2020  . Chronic diastolic CHF (congestive heart failure) ([place]) 12/16/2020    Orientation RESPIRATION BLADDER Height & Weight     Self,Time,Situation,Place  Normal Continent Weight: (!) 167.8 kg Height:  6' (182.9 cm)  BEHAVIORAL SYMPTOMS/MOOD NEUROLOGICAL BOWEL NUTRITION STATUS      Continent Diet (heart healthy Carb Modified)  AMBULATORY STATUS COMMUNICATION OF NEEDS Skin   Extensive Assist Verbally Surgical wounds                       Personal Care Assistance Level of Assistance  Bathing,Dressing Bathing Assistance: Limited assistance   Dressing Assistance: Limited assistance     Functional Limitations Info             SPECIAL CARE FACTORS FREQUENCY  PT (By licensed PT)     PT Frequency: 5 times per week              Contractures Contractures Info: Not present    Additional Factors Info  Code Status,Allergies Code Status Info: full code Allergies Info: allbuterol, grass            Current Medications (12/18/2020):  This is the current hospital active medication list Current Facility-Administered Medications  Medication Dose Route Frequency Provider Last Rate Last Admin  . acetaminophen (TYLENOL) tablet 650 mg  650 mg Oral Q6H PRN Samara Deist, DPM       Or  . acetaminophen (TYLENOL) suppository 650 mg  650 mg Rectal Q6H PRN Samara Deist, DPM      . atorvastatin (LIPITOR) tablet 20 mg  20 mg Oral Daily Samara Deist, DPM   20 mg at 12/18/20 1012  . buPROPion (WELLBUTRIN SR) 12 hr tablet 150 mg  150 mg Oral BID Samara Deist, DPM   150 mg at 12/18/20 1012  . cholecalciferol (VITAMIN D3) tablet 1,000 Units  1,000 Units Oral Daily Samara Deist, DPM   1,000 Units at 12/18/20 1012  . [START ON 12/19/2020] enoxaparin (LOVENOX) injection 85 mg  0.5 mg/kg Subcutaneous Q24H Enzo Bi, MD      . furosemide (LASIX) tablet 40 mg  40 mg Oral Daily Samara Deist, DPM   40 mg at 12/18/20 1012  . gabapentin (NEURONTIN) capsule 300 mg  300 mg Oral TID Samara Deist, DPM   300 mg at 12/18/20 1012  . hydrALAZINE (APRESOLINE) tablet 25 mg  25 mg Oral Q8H PRN Samara Deist, DPM      . levalbuterol (XOPENEX HFA) inhaler 2 puff  2 puff Inhalation Q4H PRN Vickki Muff,  Larkin Ina, DPM      . levothyroxine (SYNTHROID) tablet 88 mcg  88 mcg Oral Daily Samara Deist, DPM   88 mcg at 12/18/20 [id number]  . metolazone (ZAROXOLYN) tablet 2.5 mg  2.5 mg Oral Daily Samara Deist, DPM   2.5 mg at 12/18/20 1012  . morphine 2 MG/ML injection 1 mg  1 mg Intravenous Q2H PRN Samara Deist, DPM   1 mg at 12/17/20 2024  . ondansetron (ZOFRAN) tablet 4 mg  4 mg Oral Q6H PRN Samara Deist, DPM       Or  . ondansetron [facility]) injection 4 mg  4 mg Intravenous Q6H PRN Samara Deist, DPM      . oxyCODONE-acetaminophen (PERCOCET/ROXICET) 5-325 MG per tablet 1-2 tablet  1-2 tablet Oral Q6H PRN Samara Deist, DPM   2 tablet at 12/17/20 2331  . potassium chloride (KLOR-CON) CR tablet 10 mEq  10 mEq Oral BID  Samara Deist, DPM   10 mEq at 12/18/20 1012  . traMADol (ULTRAM) tablet 50 mg  50 mg Oral Q6H PRN Cox, Amy N, DO         Discharge Medications: Please see discharge summary for a list of discharge medications.  Relevant Imaging Results:  Relevant Lab Results:   Additional Information SS# [id number]  Su Hilt, RN

## 2020-12-18 NOTE — Progress Notes (Signed)
Patient states he does not want to use hospital unit, no unit at bedside.

## 2020-12-18 NOTE — TOC Progression Note (Signed)
Transition of Care [facility]) - Progression Note    Patient Details  Name: Christopher [name] MRN: [id number] Date of Birth: 04-Aug-1951  Transition of Care [facility]) CM/SW [place], RN Phone Number: 12/18/2020, 3:28 PM  Clinical Narrative:    Met with the patient in the room, he is agreeable to go to SNF for STR, PASSR obtained, FL2 completed, bedsearch sent, awaiting bed offers to review, Insurance auth needed, has had Covid vaccines and Booster        Expected Discharge Plan and Services                                                 Social Determinants of Health (SDOH) Interventions    Readmission Risk Interventions No flowsheet data found.

## 2020-12-18 NOTE — Progress Notes (Signed)
Follow-up left ankle ORIF.  He states the ankle itself seems to be doing well.  No real pain to the area.  Splint is intact.  No breakthrough or strikethrough bleeding to the dressing through the splint at this time.  Patient is status post ORIF medial malleolar fracture.  The ankle itself seems to be doing well.  Patient seems frustrated as currently physical therapy has recommended rehab for discharge.  I discussed that this is likely going to be his safest option to prevent further damage or stability.  He can continue to work with physical therapy.  No dressing changes needed at this time.  From a podiatry standpoint patient will be stable for discharge at any time now.  He just needs to remain nonweightbearing to this left foot.  Patient is in-house in the next week we can change the splint and dressing at that time.  If patient is discharged he should follow-up with podiatry in 2 weeks.

## 2020-12-18 NOTE — Evaluation (Signed)
Physical Therapy Evaluation Patient Details Name: Christopher [name] MRN: [id number] DOB: 12/07/1950 Today's Date: 12/18/2020   History of Present Illness  70 y.o. male with medical history significant for hypertension, heart failure preserved ejection fraction, OSA on CPAP, morbid obesity, hypothyroid, depression, presents to the emergency department for chief concerns of left ankle fracture.  ~6 weeks ago he fell in bathroom and suffered L ankle fracture, was being managed non-surgically (apparently he was ineffectively trying to keep weight off of it/wearing boot).  Pt again fell in the bathroom, apparently worsenting the fracture site and ultimatley needed ORIF 4/27  Clinical Impression  Pt was willing to participate with with some minimal mobility, but was rightfully hesitant to try much as he has struggled for the last 6 weeks to keep weight off of the L LE and did, indeed, struggle with this today as well.  He needed heavy assist to get to/from sitting/supine, could not keep weight L LE even with heavy +2 assist and constant cuing.  Each attempt to [facility] the R to try hopping, heel-toe shuffle, etc were very difficult and caused increased weight on L.  Pt quick to fatigue, anxious about mobility and ultimately neither safe nor confident with all aspects of mobility.  Follow Up Recommendations Follow surgeon's recommendation for DC plan and follow-up therapies;SNF;Supervision/Assistance - 24 hour    Equipment Recommendations  None recommended by PT (seems pt has a bariatric walker)    Recommendations for Other Services Rehab consult     Precautions / Restrictions Precautions Precautions: Fall Restrictions Weight Bearing Restrictions: Yes LLE Weight Bearing: Non weight bearing      Mobility  Bed Mobility Overal bed mobility: Needs Assistance Bed Mobility: Supine to Sit;Sit to Supine     Supine to sit: Mod assist Sit to supine: Max assist   General bed mobility comments: Pt  struggled with all aspects of mobility.  He was unable to get to actively lift LEs very well    Transfers Overall transfer level: Needs assistance Equipment used: Rolling walker (2 wheeled) Transfers: Sit to/from Stand Sit to Stand: +2 physical assistance;From elevated surface;Max assist         General transfer comment: 2 seperate standing bouts, each needing heavy +2 assist, heavy cuing, repeated reminders for NWBing, frankly pt could not keep L foot off the ground and even with heavy assist and PT foot helping to keep L off the ground he likely was putting >10 lbs through L  Ambulation/Gait             General Gait Details: Pt made attempt to do a few hopping/heel-toe shifts along EOB, unable to keep weight off L, quick to fatigue, unable to effectively use UEs to unweight/hop/etc  Stairs            Wheelchair Mobility    Modified Rankin (Stroke Patients Only)       Balance Overall balance assessment: Needs assistance Sitting-balance support: Single extremity supported Sitting balance-Leahy Scale: Good     Standing balance support: Bilateral upper extremity supported Standing balance-Leahy Scale: Poor                               Pertinent Vitals/Pain Pain Assessment: Faces Faces Pain Scale: Hurts even more Pain Location: L ankle    Home Living Family/patient expects to be discharged to:: Unsure Living Arrangements: Spouse/significant other;Children Available Help at Discharge: Available 24 hours/day   Home Access: Stairs to enter  Entrance Stairs-Rails: Right;Left Entrance Stairs-Number of Steps: 2 Home Layout: Able to live on main level with bedroom/bathroom Home Equipment: Walker - 2 wheels;Walker - 4 wheels;Wheelchair - manual      Prior Function Level of Independence: Independent with assistive device(s)         Comments: pt has been functionally very limited since initial fracture, admits that he has been having to put more  weight on it than he was supposed to     Hand Dominance        Extremity/Trunk Assessment   Upper Extremity Assessment Upper Extremity Assessment: Generalized weakness (grossly functional and equal bilaterally, but not strong enough to support his full body weight effectively)    Lower Extremity Assessment Lower Extremity Assessment: Generalized weakness (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), grossly 3+/5 in available range; L LE grossly 3/5, ankle not tested)       Communication   Communication: No difficulties  Cognition Arousal/Alertness: Awake/alert Behavior During Therapy: WFL for tasks assessed/performed Overall Cognitive Status: Within Functional Limits for tasks assessed                                        General Comments      Exercises General Exercises - Lower Extremity Heel Slides: Right;Strengthening;5 reps (arthritic crepitus during movement, poor tolerance) Hip ABduction/ADduction: Both;5 reps;Strengthening (lightly resisted) Straight Leg Raises: 5 reps;Both;AROM   Assessment/Plan    PT Assessment Patient needs continued PT services  PT Problem List Decreased strength;Decreased range of motion;Decreased activity tolerance;Decreased balance;Decreased mobility;Decreased coordination;Decreased knowledge of use of DME;Decreased safety awareness;Decreased knowledge of precautions;Pain       PT Treatment Interventions DME instruction;Gait training;Stair training;Functional mobility training;Therapeutic activities;Therapeutic exercise;Balance training;Neuromuscular re-education;Patient/family education    PT Goals (Current goals can be found in the Care Plan section)  Acute Rehab PT Goals Patient Stated Goal: Get back to walking PT Goal Formulation: With patient Time For Goal Achievement: 01/01/21 Potential to Achieve Goals: Fair    Frequency 7X/week   Barriers to discharge        Co-evaluation               AM-PAC PT "6  Clicks" Mobility  Outcome Measure Help needed turning from your back to your side while in a flat bed without using bedrails?: A Lot Help needed moving from lying on your back to sitting on the side of a flat bed without using bedrails?: A Lot Help needed moving to and from a bed to a chair (including a wheelchair)?: A Lot Help needed standing up from a chair using your arms (e.g., wheelchair or bedside chair)?: A Lot Help needed to walk in hospital room?: A Lot Help needed climbing 3-5 steps with a railing? : Total 6 Click Score: 11    End of Session Equipment Utilized During Treatment: Gait belt;Oxygen Activity Tolerance: Patient limited by pain;Patient limited by fatigue Patient left: with bed alarm set;with call bell/phone within reach Nurse Communication: Mobility status PT Visit Diagnosis: Muscle weakness (generalized) (M62.81);Difficulty in walking, not elsewhere classified (R26.2);Pain;Unsteadiness on feet (R26.81) Pain - Right/Left: Left Pain - part of body: Ankle and joints of foot    Time: [id number] PT Time Calculation (min) (ACUTE ONLY): 50 min   Charges:   PT Evaluation $PT Eval Low Complexity: 1 Low PT Treatments $Therapeutic Exercise: 8-22 mins $Therapeutic Activity: 8-22 mins  Kreg Shropshire, DPT 12/18/2020, 12:24 PM

## 2020-12-18 NOTE — Progress Notes (Signed)
PROGRESS NOTE    Christopher [name]  [id number] DOB: 1950-10-27 DOA: 12/16/2020 PCP: Rusty Aus, MD  154A/[id number]-AA   Assessment & Plan:   Principal Problem:   Closed left ankle fracture Active Problems:   OSA on CPAP   Morbid obesity with BMI of 50.0-59.9, adult (HCC)   Hyperlipidemia, mixed   Depression   Hypothyroid   Chronic diastolic CHF (congestive heart failure) ([place])   Christopher [name] is a 70 y.o. male with medical history significant for hypertension, heart failure preserved ejection fraction, OSA on CPAP, morbid obesity, hypothyroid, depression, presents to the emergency department for chief concerns of left ankle fracture.  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   Subjective and Interval History:  Pt reported no pain.  Normal oral intake.     Objective: Vitals:   12/18/20 0439 12/18/20 0814 12/18/20 1459 12/18/20 1613  BP: 117/71 (!) 108/58  102/61  Pulse: 73 67  79  Resp: '18 17  20  '$ Temp: 97.8 F (36.6 C) 97.7 F (36.5 C)  97.7 F (36.5 C)  TempSrc:      SpO2: 97% 99% 92% 90%  Weight:      Height:        Intake/Output Summary (Last  24 hours) at 12/18/2020 1857 Last data filed at 12/18/2020 1710 Gross per 24 hour  Intake --  Output 2175 ml  Net -2175 ml   Filed Weights   12/16/20 1505 12/17/20 1434  Weight: (!) 167.8 kg (!) 167.8 kg    Examination:   Constitutional: NAD, AAOx3 HEENT: conjunctivae and lids normal, EOMI CV: No cyanosis.   RESP: normal respiratory effort, on RA Extremities: RLE with chronic venous stasis changes.  Left ankle in cast SKIN: warm, dry Neuro: II - XII grossly intact.   Psych: Normal mood and affect.  Appropriate judgement and reason   Data Reviewed: I have personally reviewed following labs and imaging studies  CBC: Recent Labs  Lab 12/16/20 1547 12/17/20 0555 12/18/20 0616  WBC 8.9 7.2 12.4*  NEUTROABS 6.6  --   --   HGB 13.3 12.2* 13.2  HCT 41.5 38.9* 40.9  MCV 90.0 90.0 89.3  PLT 225 204 [id number]   Basic Metabolic Panel: Recent Labs  Lab 12/16/20 1547 12/17/20 0555 12/18/20 0616  NA 135 138 139  K 3.8 3.7 4.3  CL 92* 94* 95*  CO2 33* 34* 30  GLUCOSE 95 100* 122*  BUN 40* 40* 37*  CREATININE 1.87* 1.96* 1.77*  CALCIUM 9.2 8.8* 9.0  MG  --   --  2.0   GFR: Estimated Creatinine Clearance: 62.5 mL/min (A) (by C-G formula based on SCr of 1.77 mg/dL (H)). Liver Function Tests: No results for  input(s): AST, ALT, ALKPHOS, BILITOT, PROT, ALBUMIN in the last 168 hours. No results for input(s): LIPASE, AMYLASE in the last 168 hours. No results for input(s): AMMONIA in the last 168 hours. Coagulation Profile: Recent Labs  Lab 12/16/20 1547  INR 1.1   Cardiac Enzymes: No results for input(s): CKTOTAL, CKMB, CKMBINDEX, TROPONINI in the last 168 hours. BNP (last 3 results) No results for input(s): PROBNP in the last 8760 hours. HbA1C: No results for input(s): HGBA1C in the last 72 hours. CBG: No results for input(s): GLUCAP in the last 168 hours. Lipid Profile: No results for input(s): CHOL, HDL, LDLCALC, TRIG, CHOLHDL, LDLDIRECT in the last 72 hours. Thyroid  Function Tests: No results for input(s): TSH, T4TOTAL, FREET4, T3FREE, THYROIDAB in the last 72 hours. Anemia Panel: No results for input(s): VITAMINB12, FOLATE, FERRITIN, TIBC, IRON, RETICCTPCT in the last 72 hours. Sepsis Labs: No results for input(s): PROCALCITON, LATICACIDVEN in the last 168 hours.  Recent Results (from the past 240 hour(s))  Resp Panel by RT-PCR (Flu A&B, Covid) Nasopharyngeal Swab     Status: None   Collection Time: 12/16/20  4:05 PM   Specimen: Nasopharyngeal Swab; Nasopharyngeal(NP) swabs in vial transport medium  Result Value Ref Range Status   SARS Coronavirus 2 by RT PCR NEGATIVE NEGATIVE Final    Comment: (NOTE) SARS-CoV-2 target nucleic acids are NOT DETECTED.  The SARS-CoV-2 RNA is generally detectable in upper respiratory specimens during the acute phase of infection. The lowest concentration of SARS-CoV-2 viral copies this assay can detect is 138 copies/mL. A negative result does not preclude SARS-Cov-2 infection and should not be used as the sole basis for treatment or other patient management decisions. A negative result may occur with  improper specimen collection/handling, submission of specimen other than nasopharyngeal swab, presence of viral mutation(s) within the areas targeted by this assay, and inadequate number of viral copies(<138 copies/mL). A negative result must be combined with clinical observations, patient history, and epidemiological information. The expected result is Negative.  Fact Sheet for Patients:  EntrepreneurPulse.com.au  Fact Sheet for Healthcare Providers:  IncredibleEmployment.be  This test is no t yet approved or cleared by the [place] FDA and  has been authorized for detection and/or diagnosis of SARS-CoV-2 by FDA under an Emergency Use Authorization (EUA). This EUA will remain  in effect (meaning this test can be used) for the duration of the COVID-19 declaration under  Section 564(b)(1) of the Act, 21 U.S.C.section 360bbb-3(b)(1), unless the authorization is terminated  or revoked sooner.       Influenza A by PCR NEGATIVE NEGATIVE Final   Influenza B by PCR NEGATIVE NEGATIVE Final    Comment: (NOTE) The Xpert Xpress SARS-CoV-2/FLU/RSV plus assay is intended as an aid in the diagnosis of influenza from Nasopharyngeal swab specimens and should not be used as a sole basis for treatment. Nasal washings and aspirates are unacceptable for Xpert Xpress SARS-CoV-2/FLU/RSV testing.  Fact Sheet for Patients: EntrepreneurPulse.com.au  Fact Sheet for Healthcare Providers: IncredibleEmployment.be  This test is not yet approved or cleared by the [place] FDA and has been authorized for detection and/or diagnosis of SARS-CoV-2 by FDA under an Emergency Use Authorization (EUA). This EUA will remain in effect (meaning this test can be used) for the duration of the COVID-19 declaration under Section 564(b)(1) of the Act, 21 U.S.C. section 360bbb-3(b)(1), unless the authorization is terminated or revoked.  Performed at [facility], [address], [place],  [address]  Radiology Studies: DG MINI C-ARM IMAGE ONLY  Result Date: 12/17/2020 There is no interpretation for this exam.  This order is for images obtained during a surgical procedure.  Please See "Surgeries" Tab for more information regarding the procedure.     Scheduled Meds: . atorvastatin  20 mg Oral Daily  . buPROPion  150 mg Oral BID  . cholecalciferol  1,000 Units Oral Daily  . [START ON 12/19/2020] enoxaparin (LOVENOX) injection  0.5 mg/kg Subcutaneous Q24H  . furosemide  40 mg Oral Daily  . gabapentin  300 mg Oral TID  . levothyroxine  88 mcg Oral Daily  . metolazone  2.5 mg Oral Daily  . potassium chloride  10 mEq Oral BID   Continuous Infusions:   LOS: 2 days     Enzo Bi, MD [facility] If 7PM-7AM, please  contact night-coverage 12/18/2020, 6:57 PM

## 2020-12-19 LAB — BPAM RBC
Blood Product Expiration Date: 202205202359
Blood Product Expiration Date: 202205202359
Unit Type and Rh: 6200
Unit Type and Rh: 6200

## 2020-12-19 LAB — TYPE AND SCREEN
ABO/RH(D): A POS
Antibody Screen: POSITIVE
DAT, IgG: POSITIVE
DAT, complement: NEGATIVE
Unit division: 0
Unit division: 0

## 2020-12-19 LAB — BASIC METABOLIC PANEL
Anion gap: 10 (ref 5–15)
BUN: 37 mg/dL — ABNORMAL HIGH (ref 8–23)
CO2: 35 mmol/L — ABNORMAL HIGH (ref 22–32)
Calcium: 8.7 mg/dL — ABNORMAL LOW (ref 8.9–10.3)
Chloride: 95 mmol/L — ABNORMAL LOW (ref 98–111)
Creatinine, Ser: 1.91 mg/dL — ABNORMAL HIGH (ref 0.61–1.24)
GFR, Estimated: 37 mL/min — ABNORMAL LOW (ref >60)
Glucose, Bld: 94 mg/dL (ref 70–99)
Potassium: 3.7 mmol/L (ref 3.5–5.1)
Sodium: 140 mmol/L (ref 135–145)

## 2020-12-19 LAB — CBC
HCT: 38 % — ABNORMAL LOW (ref 39.0–52.0)
Hemoglobin: 11.8 g/dL — ABNORMAL LOW (ref 13.0–17.0)
MCH: 28.3 pg (ref 26.0–34.0)
MCHC: 31.1 g/dL (ref 30.0–36.0)
MCV: 91.1 fL (ref 80.0–100.0)
Platelets: 218 10*3/uL (ref 150–400)
RBC: 4.17 MIL/uL — ABNORMAL LOW (ref 4.22–5.81)
RDW: 14.5 % (ref 11.5–15.5)
WBC: 7 10*3/uL (ref 4.0–10.5)
nRBC: 0 % (ref 0.0–0.2)

## 2020-12-19 LAB — MAGNESIUM: Magnesium: 2 mg/dL (ref 1.7–2.4)

## 2020-12-19 NOTE — Progress Notes (Signed)
Physical Therapy Treatment Patient Details Name: Christopher [name] MRN: [id number] DOB: Nov 28, 1950 Today's Date: 12/19/2020    History of Present Illness 70 y.o. male with medical history significant for hypertension, heart failure preserved ejection fraction, OSA on CPAP, morbid obesity, hypothyroid, depression, presents to the emergency department for chief concerns of left ankle fracture.  ~6 weeks ago he fell in bathroom and suffered L ankle fracture, was being managed non-surgically (apparently he was ineffectively trying to keep weight off of it/wearing boot).  Pt again fell in the bathroom, apparently worsenting the fracture site and ultimatley needed ORIF 4/27    PT Comments    Pt seen for PT treatment, requesting assistance to get back to bed. Pt able to provide 50% instruction for lateral scoot transfer. PT provides total assist for chair positioning & education re: technique for head/hips relationship & sequencing lateral scoot chair>bed. Pt ultimately requires min assist +2 for safety when transferring to L. Pt requires MAX cuing for head/hips relationship as once on EOB pt attempts to lay down before fully scooted back onto bed. Pt is able to reposition with min assist & scoot to [facility] once supine with extra time, bed rails & bed flat. Pt on room air throughout session, after transfer SpO2 85% but able to recover to 93% with pursed lip breathing within 60 seconds but drops down to 89-90% on room air. Pt declining supplemental O2. PT encouraged pt to sit more upright to allow for more lung expansion but remains at [facility] 30 degrees. Pt left on room air at 89-90% & nurse notified reporting she will check on pt shortly.   Pt requires cuing to maintain NWB LLE during transfer but does appear to push through extremity some.    Follow Up Recommendations  Follow surgeon's recommendation for DC plan and follow-up therapies;SNF;Supervision/Assistance - 24 hour     Equipment Recommendations   (TBD in  next venue)    Recommendations for Other Services       Precautions / Restrictions Precautions Precautions: Fall Restrictions Weight Bearing Restrictions: Yes LLE Weight Bearing: Non weight bearing    Mobility  Bed Mobility Overal bed mobility: Needs Assistance Bed Mobility: Sit to Supine     Sit to supine: Supervision;HOB elevated   General bed mobility comments: reliance on bed rails, HOB significantly elevated, extra time to fully upright trunk to sitting EOB    Transfers Overall transfer level: Needs assistance Equipment used: None Transfers: Lateral/Scoot Transfers Sit to Stand: Supervision        Lateral/Scoot Transfers: Min assist;+2 physical assistance;+2 safety/equipment General transfer comment: Pt able to complete lateral scoot bed>recliner at equal height, reviewed head/hips relationship with pt, pt with minimal pushing through BLE (cuing to not push through LLE) during transfer  Ambulation/Gait                 Stairs             Wheelchair Mobility    Modified Rankin (Stroke Patients Only)       Balance Overall balance assessment: Needs assistance Sitting-balance support: Bilateral upper extremity supported;Feet supported Sitting balance-Leahy Scale: Good                                      Cognition Arousal/Alertness: Awake/alert Behavior During Therapy: WFL for tasks assessed/performed Overall Cognitive Status: Within Functional Limits for tasks assessed  Exercises     General Comments        Pertinent Vitals/Pain Pain Assessment: No/denies pain    Home Living                      Prior Function            PT Goals (current goals can now be found in the care plan section) Acute Rehab PT Goals Patient Stated Goal: Get back to walking PT Goal Formulation: With patient Time For Goal Achievement: 01/01/21 Potential to Achieve Goals:  Fair Progress towards PT goals: Progressing toward goals    Frequency    7X/week      PT Plan Current plan remains appropriate    Co-evaluation              AM-PAC PT "6 Clicks" Mobility   Outcome Measure  Help needed turning from your back to your side while in a flat bed without using bedrails?: A Little Help needed moving from lying on your back to sitting on the side of a flat bed without using bedrails?: A Lot Help needed moving to and from a bed to a chair (including a wheelchair)?: A Lot Help needed standing up from a chair using your arms (e.g., wheelchair or bedside chair)?: A Lot Help needed to walk in hospital room?: Total Help needed climbing 3-5 steps with a railing? : Total 6 Click Score: 11    End of Session Equipment Utilized During Treatment: Gait belt Activity Tolerance: Patient tolerated treatment well Patient left: in bed;with call bell/phone within reach;with bed alarm set;with nursing/sitter in room;with family/visitor present Nurse Communication:  (O2 levels) PT Visit Diagnosis: Muscle weakness (generalized) (M62.81);Difficulty in walking, not elsewhere classified (R26.2);Pain;Unsteadiness on feet (R26.81) Pain - Right/Left: Left Pain - part of body: Ankle and joints of foot     Time: 1555-1606 PT Time Calculation (min) (ACUTE ONLY): 11 min  Charges:  $Therapeutic Activity: 8-22 mins                     Lavone Nian, PT, DPT 12/19/20, 4:14 PM    Waunita Schooner 12/19/2020, 4:11 PM

## 2020-12-19 NOTE — Care Management Important Message (Signed)
Important Message  Patient Details  Name: Christopher [name] MRN: [id number] Date of Birth: [birth year]   Medicare Important Message Given:  Yes     Juliann Pulse A Freddie Dymek 12/19/2020, 1:03 PM

## 2020-12-19 NOTE — Progress Notes (Signed)
Physical Therapy Treatment Patient Details Name: Christopher [name] MRN: [id number] DOB: 13-Aug-1951 Today's Date: 12/19/2020    History of Present Illness 69 y.o. male with medical history significant for hypertension, heart failure preserved ejection fraction, OSA on CPAP, morbid obesity, hypothyroid, depression, presents to the emergency department for chief concerns of left ankle fracture.  ~6 weeks ago he fell in bathroom and suffered L ankle fracture, was being managed non-surgically (apparently he was ineffectively trying to keep weight off of it/wearing boot).  Pt again fell in the bathroom, apparently worsenting the fracture site and ultimatley needed ORIF 4/27    PT Comments    Pt seen for PT treatment with nasal cannula out of nose with pt reporting he did that. Pt on room air throughout session with SpO2 91% or > throughout session. Pt is able to complete bed mobility with supervision but heavy reliance on bed rails & HOB elevated, with extra time given. Reviewed various methods of bed>chair transfer with pt ultimately completing lateral scoot bed>chair with supervision after reviewing head/hips relationship. Pt encouraged to get OOB to chair as much as possible. Will continue to follow pt acutely to progress transfers as able. Continue to recommend STR upon d/c to maximize independence with functional mobility prior to return home.     Follow Up Recommendations  Follow surgeon's recommendation for DC plan and follow-up therapies;SNF;Supervision/Assistance - 24 hour     Equipment Recommendations   (TBD in next venue)    Recommendations for Other Services       Precautions / Restrictions Precautions Precautions: Fall Restrictions Weight Bearing Restrictions: Yes LLE Weight Bearing: Non weight bearing    Mobility  Bed Mobility Overal bed mobility: Needs Assistance Bed Mobility: Supine to Sit     Supine to sit: Supervision;HOB elevated     General bed mobility comments:  reliance on bed rails, HOB significantly elevated, extra time to fully upright trunk to sitting EOB    Transfers Overall transfer level: Needs assistance Equipment used: None Transfers: Lateral/Scoot Transfers Sit to Stand: Supervision         General transfer comment: Pt able to complete lateral scoot bed>recliner at equal height, reviewed head/hips relationship with pt, pt with minimal pushing through BLE (cuing to not push through LLE) during transfer  Ambulation/Gait                 Stairs             Wheelchair Mobility    Modified Rankin (Stroke Patients Only)       Balance Overall balance assessment: Needs assistance Sitting-balance support: Bilateral upper extremity supported;Feet supported Sitting balance-Leahy Scale: Good                                      Cognition Arousal/Alertness: Awake/alert Behavior During Therapy: WFL for tasks assessed/performed Overall Cognitive Status: Within Functional Limits for tasks assessed                                        Exercises General Exercises - Lower Extremity Long Arc Quad: AROM;Strengthening;Both;10 reps;Supine (1 rest break after 5 reps for LLE 2/2 fatigue & "tightness" in back of knee during exercise)    General Comments        Pertinent Vitals/Pain Pain Assessment: No/denies pain    Home  Living                      Prior Function            PT Goals (current goals can now be found in the care plan section) Acute Rehab PT Goals Patient Stated Goal: Get back to walking PT Goal Formulation: With patient Time For Goal Achievement: 01/01/21 Potential to Achieve Goals: Fair Progress towards PT goals: Progressing toward goals    Frequency    7X/week      PT Plan Current plan remains appropriate    Co-evaluation              AM-PAC PT "6 Clicks" Mobility   Outcome Measure  Help needed turning from your back to your side while  in a flat bed without using bedrails?: A Little Help needed moving from lying on your back to sitting on the side of a flat bed without using bedrails?: A Lot Help needed moving to and from a bed to a chair (including a wheelchair)?: A Little Help needed standing up from a chair using your arms (e.g., wheelchair or bedside chair)?: A Lot Help needed to walk in hospital room?: Total Help needed climbing 3-5 steps with a railing? : Total 6 Click Score: 12    End of Session Equipment Utilized During Treatment: Oxygen Activity Tolerance: Patient tolerated treatment well Patient left: in bed;with call bell/phone within reach;with chair alarm set Nurse Communication: Mobility status PT Visit Diagnosis: Muscle weakness (generalized) (M62.81);Difficulty in walking, not elsewhere classified (R26.2);Pain;Unsteadiness on feet (R26.81) Pain - Right/Left: Left Pain - part of body: Ankle and joints of foot     Time: [id number] PT Time Calculation (min) (ACUTE ONLY): 20 min  Charges:  $Therapeutic Activity: 8-22 mins                     Lavone Nian, PT, DPT 12/19/20, 2:38 PM    Waunita Schooner 12/19/2020, 2:36 PM

## 2020-12-19 NOTE — TOC Progression Note (Signed)
Transition of Care [facility]) - Progression Note    Patient Details  Name: Christopher [name] MRN: [id number] Date of Birth: 08-15-1951  [facility]) CM/SW [place], RN Phone Number: 12/19/2020, 11:25 AM  Clinical Narrative:   Reached out to the pending SNF and requested that they look at the patient for a possible bed offer, awaiting offers         Expected Discharge Plan and Services                                                 Social Determinants of Health (SDOH) Interventions    Readmission Risk Interventions No flowsheet data found.

## 2020-12-19 NOTE — TOC Progression Note (Signed)
Transition of Care [facility]) - Progression Note    Patient Details  Name: Christopher [name] MRN: [id number] Date of Birth: [birth year]  Transition of Care [facility]) CM/SW [place], RN Phone Number: 12/19/2020, 12:40 PM  Clinical Narrative:   Reviewed Bed offers with the patient  I wrote them down along with star rating and phone number, he will call his wife and let me know         Expected Discharge Plan and Services                                                 Social Determinants of Health (SDOH) Interventions    Readmission Risk Interventions No flowsheet data found.

## 2020-12-20 LAB — CBC
HCT: 36.4 % — ABNORMAL LOW (ref 39.0–52.0)
Hemoglobin: 11.4 g/dL — ABNORMAL LOW (ref 13.0–17.0)
MCH: 28.8 pg (ref 26.0–34.0)
MCHC: 31.3 g/dL (ref 30.0–36.0)
MCV: 91.9 fL (ref 80.0–100.0)
Platelets: 211 10*3/uL (ref 150–400)
RBC: 3.96 MIL/uL — ABNORMAL LOW (ref 4.22–5.81)
RDW: 14.6 % (ref 11.5–15.5)
WBC: 6.5 10*3/uL (ref 4.0–10.5)
nRBC: 0 % (ref 0.0–0.2)

## 2020-12-20 LAB — BASIC METABOLIC PANEL
Anion gap: 11 (ref 5–15)
BUN: 37 mg/dL — ABNORMAL HIGH (ref 8–23)
CO2: 32 mmol/L (ref 22–32)
Calcium: 8.9 mg/dL (ref 8.9–10.3)
Chloride: 97 mmol/L — ABNORMAL LOW (ref 98–111)
Creatinine, Ser: 1.65 mg/dL — ABNORMAL HIGH (ref 0.61–1.24)
GFR, Estimated: 44 mL/min — ABNORMAL LOW (ref >60)
Glucose, Bld: 95 mg/dL (ref 70–99)
Potassium: 3.6 mmol/L (ref 3.5–5.1)
Sodium: 140 mmol/L (ref 135–145)

## 2020-12-20 LAB — MAGNESIUM: Magnesium: 2 mg/dL (ref 1.7–2.4)

## 2020-12-20 NOTE — Plan of Care (Signed)
  Problem: Activity: Goal: Risk for activity intolerance will decrease Outcome: Progressing   Problem: Coping: Goal: Level of anxiety will decrease Outcome: Progressing   Problem: Pain Managment: Goal: General experience of comfort will improve Outcome: Progressing   Problem: Safety: Goal: Ability to remain free from injury will improve Outcome: Progressing   Problem: Skin Integrity: Goal: Risk for impaired skin integrity will decrease Outcome: Progressing   

## 2020-12-20 NOTE — Progress Notes (Signed)
PT Cancellation Note  Patient Details Name: Esias Mctier MRN: [id number] DOB: 1951-05-20   Cancelled Treatment:    Reason Eval/Treat Not Completed: Patient declined, no reason specified Pt on phone upon arrival and just completed toileting. Requesting a rest prior to mobility.    Maili Shutters A Richanda Darin 12/20/2020, 11:47 AM

## 2020-12-20 NOTE — TOC Progression Note (Signed)
Transition of Care [facility]) - Progression Note    Patient Details  Name: Christopher [name] MRN: [id number] Date of Birth: [birth year]  Transition of Care [facility]) CM/SW [place], RN Phone Number: 12/20/2020, 3:44 PM  Clinical Narrative:   Patient reports that he needs to talk to his wife again about bed offer for [facility], insurance authorization pending.          Expected Discharge Plan and Services                                                 Social Determinants of Health (SDOH) Interventions    Readmission Risk Interventions No flowsheet data found.

## 2020-12-20 NOTE — Progress Notes (Signed)
PROGRESS NOTE    Kishan Doddridge  [id number] DOB: [birth year] DOA: 12/16/2020 PCP: Rusty Aus, MD  154A/154A-AA   Assessment & Plan:   Principal Problem:   Closed left ankle fracture Active Problems:   OSA on CPAP   Morbid obesity with BMI of 50.0-59.9, adult (HCC)   Hyperlipidemia, mixed   Depression   Hypothyroid   Chronic diastolic CHF (congestive heart failure) ([place])   Graham Wysong is a 70 y.o. male with medical history significant for hypertension, heart failure preserved ejection fraction, OSA on CPAP, morbid obesity, hypothyroid, depression, presents to the emergency department for chief concerns of left ankle fracture.  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   Subjective and Interval History:  No new complaints.  Waiting on SNF rehab.   Objective: Vitals:   12/18/20 2056 12/19/20 0552 12/19/20 0853 12/19/20 1954  BP: (!) 107/58 (!) 108/57 (!) 94/54 128/63  Pulse: 79 64 72 85  Resp: '18 18 20 19  '$ Temp: 98.1 F (36.7 C) 97.6 F (36.4 C)  (!) 97.5 F (36.4 C) 97.9 F (36.6 C)  TempSrc:    Oral  SpO2: 98% 100% 97% 95%  Weight:      Height:        Intake/Output Summary (Last 24 hours) at 12/20/2020 0010 Last data filed at 12/19/2020 1032 Gross per 24 hour  Intake 120 ml  Output --  Net 120 ml   Filed Weights   12/16/20 1505 12/17/20 1434  Weight: (!) 167.8 kg (!) 167.8 kg    Examination:   Constitutional: NAD, AAOx3 HEENT: conjunctivae and lids normal, EOMI CV: No cyanosis.   RESP: normal respiratory effort, on RA Extremities: RLE with chronic venous stasis changes.  Left ankle in cast SKIN: warm, dry Neuro: II - XII grossly intact.   Psych: Normal mood and affect.  Appropriate judgement and reason    Data Reviewed: I have personally reviewed following labs and imaging studies  CBC: Recent Labs  Lab 12/16/20 1547 12/17/20 0555 12/18/20 0616 12/19/20 0637  WBC 8.9 7.2 12.4* 7.0  NEUTROABS 6.6  --   --   --   HGB 13.3 12.2* 13.2 11.8*  HCT 41.5 38.9* 40.9 38.0*  MCV 90.0 90.0 89.3 91.1  PLT 225 204 226 [id number]   Basic Metabolic Panel: Recent Labs  Lab 12/16/20 1547 12/17/20 0555 12/18/20 0616 12/19/20 0637  NA 135 138 139 140  K 3.8 3.7 4.3 3.7  CL 92* 94* 95* 95*  CO2 33* 34* 30 35*  GLUCOSE 95 100* 122* 94  BUN 40* 40* 37* 37*  CREATININE 1.87* 1.96*  1.77* 1.91*  CALCIUM 9.2 8.8* 9.0 8.7*  MG  --   --  2.0 2.0   GFR: Estimated Creatinine Clearance: 57.9 mL/min (A) (by C-G formula based on SCr of 1.91 mg/dL (H)). Liver Function Tests: No results for input(s): AST, ALT, ALKPHOS, BILITOT, PROT, ALBUMIN in the last 168 hours. No results for input(s): LIPASE, AMYLASE in the last 168 hours. No results for input(s): AMMONIA in the last 168 hours. Coagulation Profile: Recent Labs  Lab 12/16/20 1547  INR 1.1   Cardiac Enzymes: No results for input(s): CKTOTAL, CKMB, CKMBINDEX, TROPONINI in the last 168 hours. BNP (last 3 results) No results for input(s): PROBNP in the last 8760  hours. HbA1C: No results for input(s): HGBA1C in the last 72 hours. CBG: No results for input(s): GLUCAP in the last 168 hours. Lipid Profile: No results for input(s): CHOL, HDL, LDLCALC, TRIG, CHOLHDL, LDLDIRECT in the last 72 hours. Thyroid Function Tests: No results for input(s): TSH, T4TOTAL, FREET4, T3FREE, THYROIDAB in the last 72 hours. Anemia Panel: No results for input(s): VITAMINB12, FOLATE, FERRITIN, TIBC, IRON, RETICCTPCT in the last 72 hours. Sepsis Labs: No results for input(s): PROCALCITON, LATICACIDVEN in the last 168 hours.  Recent Results (from the past 240 hour(s))  Resp Panel by RT-PCR (Flu A&B, Covid) Nasopharyngeal Swab     Status: None   Collection Time: 12/16/20  4:05 PM   Specimen: Nasopharyngeal Swab; Nasopharyngeal(NP) swabs in vial transport medium  Result Value Ref Range Status   SARS Coronavirus 2 by RT PCR NEGATIVE NEGATIVE Final    Comment: (NOTE) SARS-CoV-2 target nucleic acids are NOT DETECTED.  The SARS-CoV-2 RNA is generally detectable in upper respiratory specimens during the acute phase of infection. The lowest concentration of SARS-CoV-2 viral copies this assay can detect is 138 copies/mL. A negative result does not preclude SARS-Cov-2 infection and should not be used as the sole basis for treatment or other patient management decisions. A negative result may occur with  improper specimen collection/handling, submission of specimen other than nasopharyngeal swab, presence of viral mutation(s) within the areas targeted by this assay, and inadequate number of viral copies(<138 copies/mL). A negative result must be combined with clinical observations, patient history, and epidemiological information. The expected result is Negative.  Fact Sheet for Patients:  EntrepreneurPulse.com.au  Fact Sheet for Healthcare Providers:  IncredibleEmployment.be  This test is no t yet approved or cleared by the [place] FDA and  has been authorized for detection and/or diagnosis of SARS-CoV-2 by FDA under an Emergency Use Authorization (EUA). This EUA will remain  in effect (meaning this test can be used) for the duration of the COVID-19 declaration under Section 564(b)(1) of the Act, 21 U.S.C.section 360bbb-3(b)(1), unless the authorization is terminated  or revoked sooner.       Influenza A by PCR NEGATIVE NEGATIVE Final   Influenza B by PCR NEGATIVE NEGATIVE Final    Comment: (NOTE) The Xpert Xpress SARS-CoV-2/FLU/RSV plus assay is intended as an aid in the diagnosis of influenza from Nasopharyngeal swab specimens and should not be used as a sole basis for treatment. Nasal washings and aspirates are unacceptable for Xpert Xpress SARS-CoV-2/FLU/RSV testing.  Fact Sheet for Patients: EntrepreneurPulse.com.au  Fact Sheet for Healthcare Providers: IncredibleEmployment.be  This test is not yet approved or cleared by the [place] FDA and has been authorized for detection and/or diagnosis of SARS-CoV-2 by FDA under an Emergency Use Authorization (EUA). This EUA will remain in effect (meaning this test can be used)  for the duration of the COVID-19 declaration under Section 564(b)(1) of the Act, 21 U.S.C. section 360bbb-3(b)(1), unless the authorization is terminated or revoked.  Performed at [facility], [address], [place], [place] [address]       Radiology Studies: No results found.   Scheduled Meds: . atorvastatin  20 mg Oral Daily  . buPROPion  150 mg Oral BID  . cholecalciferol  1,000 Units Oral Daily  . enoxaparin (LOVENOX) injection  0.5 mg/kg Subcutaneous Q24H  . gabapentin  300 mg Oral TID  . levothyroxine  88 mcg Oral Daily  . potassium chloride  10 mEq Oral BID  . senna-docusate  1 tablet Oral QHS   Continuous Infusions:   LOS: 4 days     Enzo Bi, MD [facility] If 7PM-7AM, please contact  night-coverage 12/20/2020, 12:10 AM

## 2020-12-20 NOTE — Progress Notes (Signed)
PROGRESS NOTE    Christopher [name]  [id number] DOB: 06/14/1951 DOA: 12/16/2020 PCP: Rusty Aus, MD  154A/154A-AA   Assessment & Plan:   Principal Problem:   Closed left ankle fracture Active Problems:   OSA on CPAP   Morbid obesity with BMI of 50.0-59.9, adult (HCC)   Hyperlipidemia, mixed   Depression   Hypothyroid   Chronic diastolic CHF (congestive heart failure) ([place])   Christopher [name] is a 70 y.o. male with medical history significant for hypertension, heart failure preserved ejection fraction, OSA on CPAP, morbid obesity, hypothyroid, depression, presents to the emergency department for chief concerns of left ankle fracture.  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   Subjective and Interval History:  No new complaints.  Waiting on SNF rehab.   Objective: Vitals:   12/19/20 1704 12/19/20 1954 12/20/20 0422 12/20/20 0824  BP: 109/68 128/63 (!) 112/53 113/71  Pulse: 88 85 66 75  Resp: '20 19 18 18  '$ Temp:  98.8 F (37.1 C) 97.9 F (36.6 C) 97.8 F (36.6 C) (!) 97.4 F (36.3 C)  TempSrc:  Oral Oral   SpO2: (!) 89% 95% 98% 97%  Weight:      Height:        Intake/Output Summary (Last 24 hours) at 12/20/2020 1426 Last data filed at 12/20/2020 1357 Gross per 24 hour  Intake 340 ml  Output --  Net 340 ml   Filed Weights   12/16/20 1505 12/17/20 1434  Weight: (!) 167.8 kg (!) 167.8 kg    Examination:   Constitutional: NAD, awake, alert, oriented x3 CV: RRR, normal 21-s2 heart sounds RESP: CTAB diminished due to body habitus, normal respiratory effort, on RA Extremities: RLE with chronic venous stasis changes.  Left ankle in cast with intact distal sensation at toes Neuro: II - XII grossly intact.  No gross focal deficits Psych: Normal mood and affect.  Appropriate judgement and reason    Data Reviewed: I have personally reviewed following labs and imaging studies  CBC: Recent Labs  Lab 12/16/20 1547 12/17/20 0555 12/18/20 0616 12/19/20 0637 12/20/20 0543  WBC 8.9 7.2 12.4* 7.0 6.5  NEUTROABS 6.6  --   --   --   --   HGB 13.3 12.2* 13.2 11.8* 11.4*  HCT 41.5 38.9* 40.9 38.0* 36.4*  MCV 90.0 90.0 89.3 91.1 91.9  PLT 225 204 226 218 [id number]   Basic Metabolic Panel: Recent Labs  Lab 12/16/20 1547 12/17/20 0555 12/18/20 0616 12/19/20 0637 12/20/20 0543  NA 135 138 139 140 140  K 3.8 3.7 4.3  3.7 3.6  CL 92* 94* 95* 95* 97*  CO2 33* 34* 30 35* 32  GLUCOSE 95 100* 122* 94 95  BUN 40* 40* 37* 37* 37*  CREATININE 1.87* 1.96* 1.77* 1.91* 1.65*  CALCIUM 9.2 8.8* 9.0 8.7* 8.9  MG  --   --  2.0 2.0 2.0   GFR: Estimated Creatinine Clearance: 67 mL/min (A) (by C-G formula based on SCr of 1.65 mg/dL (H)). Liver Function Tests: No results for input(s): AST, ALT, ALKPHOS, BILITOT, PROT, ALBUMIN in the last 168 hours. No results for input(s): LIPASE, AMYLASE in the last 168 hours. No results for input(s): AMMONIA in the last 168 hours. Coagulation Profile: Recent Labs  Lab  12/16/20 1547  INR 1.1   Cardiac Enzymes: No results for input(s): CKTOTAL, CKMB, CKMBINDEX, TROPONINI in the last 168 hours. BNP (last 3 results) No results for input(s): PROBNP in the last 8760 hours. HbA1C: No results for input(s): HGBA1C in the last 72 hours. CBG: No results for input(s): GLUCAP in the last 168 hours. Lipid Profile: No results for input(s): CHOL, HDL, LDLCALC, TRIG, CHOLHDL, LDLDIRECT in the last 72 hours. Thyroid Function Tests: No results for input(s): TSH, T4TOTAL, FREET4, T3FREE, THYROIDAB in the last 72 hours. Anemia Panel: No results for input(s): VITAMINB12, FOLATE, FERRITIN, TIBC, IRON, RETICCTPCT in the last 72 hours. Sepsis Labs: No results for input(s): PROCALCITON, LATICACIDVEN in the last 168 hours.  Recent Results (from the past 240 hour(s))  Resp Panel by RT-PCR (Flu A&B, Covid) Nasopharyngeal Swab     Status: None   Collection Time: 12/16/20  4:05 PM   Specimen: Nasopharyngeal Swab; Nasopharyngeal(NP) swabs in vial transport medium  Result Value Ref Range Status   SARS Coronavirus 2 by RT PCR NEGATIVE NEGATIVE Final    Comment: (NOTE) SARS-CoV-2 target nucleic acids are NOT DETECTED.  The SARS-CoV-2 RNA is generally detectable in upper respiratory specimens during the acute phase of infection. The lowest concentration of SARS-CoV-2 viral copies this assay can detect is 138 copies/mL. A negative result does not preclude SARS-Cov-2 infection and should not be used as the sole basis for treatment or other patient management decisions. A negative result may occur with  improper specimen collection/handling, submission of specimen other than nasopharyngeal swab, presence of viral mutation(s) within the areas targeted by this assay, and inadequate number of viral copies(<138 copies/mL). A negative result must be combined with clinical observations, patient history, and epidemiological information. The expected result is Negative.  Fact Sheet  for Patients:  EntrepreneurPulse.com.au  Fact Sheet for Healthcare Providers:  IncredibleEmployment.be  This test is no t yet approved or cleared by the [place] FDA and  has been authorized for detection and/or diagnosis of SARS-CoV-2 by FDA under an Emergency Use Authorization (EUA). This EUA will remain  in effect (meaning this test can be used) for the duration of the COVID-19 declaration under Section 564(b)(1) of the Act, 21 U.S.C.section 360bbb-3(b)(1), unless the authorization is terminated  or revoked sooner.       Influenza A by PCR NEGATIVE NEGATIVE Final   Influenza B by PCR NEGATIVE NEGATIVE Final    Comment: (NOTE) The Xpert Xpress SARS-CoV-2/FLU/RSV plus assay is intended as an aid in the diagnosis of influenza from Nasopharyngeal swab specimens and should not be used as a sole basis for treatment. Nasal washings and aspirates are unacceptable for Xpert Xpress SARS-CoV-2/FLU/RSV testing.  Fact Sheet for Patients: EntrepreneurPulse.com.au  Fact Sheet for Healthcare Providers: IncredibleEmployment.be  This test is not yet approved  or cleared by the [place] and has been authorized for detection and/or diagnosis of SARS-CoV-2 by FDA under an Emergency Use Authorization (EUA). This EUA will remain in effect (meaning this test can be used) for the duration of the COVID-19 declaration under Section 564(b)(1) of the Act, 21 U.S.C. section 360bbb-3(b)(1), unless the authorization is terminated or revoked.  Performed at [facility], [address], [place], [place] [address]       Radiology Studies: No results found.   Scheduled Meds: . atorvastatin  20 mg Oral Daily  . buPROPion  150 mg Oral BID  . cholecalciferol  1,000 Units Oral Daily  . enoxaparin (LOVENOX) injection  0.5 mg/kg Subcutaneous Q24H  . gabapentin  300 mg Oral TID  . levothyroxine  88 mcg Oral  Daily  . potassium chloride  10 mEq Oral BID  . senna-docusate  1 tablet Oral QHS   Continuous Infusions:   LOS: 4 days   Time spent: 25 minutes with > 50% spent at bedside and in coordination of care.   Ezekiel Slocumb, DO [facility] If 7PM-7AM, please contact night-coverage 12/20/2020, 2:26 PM

## 2020-12-20 NOTE — Progress Notes (Signed)
3 Days Post-Op   Subjective/Chief Complaint: Patient seen.  Resting comfortably.  Denies any significant pain.   Objective: Vital signs in last 24 hours: Temp:  [97.4 F (36.3 C)-98.8 F (37.1 C)] 97.4 F (36.3 C) (04/30 0824) Pulse Rate:  [66-88] 75 (04/30 0824) Resp:  [18-20] 18 (04/30 0824) BP: (109-128)/(53-71) 113/71 (04/30 0824) SpO2:  [89 %-98 %] 97 % (04/30 0824) Last BM Date: 12/16/20  Intake/Output from previous day: 04/29 0701 - 04/30 0700 In: 120 [P.O.:120] Out: -  Intake/Output this shift: No intake/output data recorded.  Dressing and splint on the left foot are dry and intact.  Capillary refill intact all toes.  Lab Results:  Recent Labs    12/19/20 0637 12/20/20 0543  WBC 7.0 6.5  HGB 11.8* 11.4*  HCT 38.0* 36.4*  PLT 218 211   BMET Recent Labs    12/19/20 0637 12/20/20 0543  NA 140 140  K 3.7 3.6  CL 95* 97*  CO2 35* 32  GLUCOSE 94 95  BUN 37* 37*  CREATININE 1.91* 1.65*  CALCIUM 8.7* 8.9   PT/INR No results for input(s): LABPROT, INR in the last 72 hours. ABG No results for input(s): PHART, HCO3 in the last 72 hours.  Invalid input(s): PCO2, PO2  Studies/Results: No results found.  Anti-infectives: Anti-infectives (From admission, onward)   Start     Dose/Rate Route Frequency Ordered Stop   12/17/20 0600  ceFAZolin (ANCEF) IVPB 3g/100 mL premix  Status:  Discontinued        3 g 200 mL/hr over 30 Minutes Intravenous On call to O.R. 12/16/20 2319 12/16/20 2356   12/17/20 0500  ceFAZolin (ANCEF) 3 g in dextrose 5 % 50 mL IVPB        3 g 100 mL/hr over 30 Minutes Intravenous 60 min pre-op 12/16/20 2356 12/17/20 1534      Assessment/Plan: s/p Procedure(s): OPEN REDUCTION INTERNAL FIXATION (ORIF) ANKLE FRACTURE (Left)  Assessment: Stable status post ORIF left ankle fracture.  Plan: Dressing and splint left intact.  Awaiting placement.  Follow-up with Dr. Vickki Muff as scheduled  LOS: 4 days    Durward Fortes 12/20/2020

## 2020-12-20 NOTE — Progress Notes (Signed)
Physical Therapy Treatment Patient Details Name: Christopher [name] MRN: [id number] DOB: 30-Sep-1950 Today's Date: 12/20/2020    History of Present Illness 70 y.o. male with medical history significant for hypertension, heart failure preserved ejection fraction, OSA on CPAP, morbid obesity, hypothyroid, depression, presents to the emergency department for chief concerns of left ankle fracture.  ~6 weeks ago he fell in bathroom and suffered L ankle fracture, was being managed non-surgically (apparently he was ineffectively trying to keep weight off of it/wearing boot).  Pt again fell in the bathroom, apparently worsenting the fracture site and ultimatley needed ORIF 4/27    PT Comments    The pt presents this session in good spirits. He reports having 24/7 assistance at home as well as DME. Per the pt he feels as though he would be unable to hop and d/t his fear of falling is less likely to attempt while maintaining WB precautions. This session the pt is able to participate in stand pivot transfer while maintaining WB precautions with Min Guard assistance. The pt is educated on use of RW for all mobility instead of crutches d/t increased stability. He is also agreeable to HHPT. In order for the pt to safely d/c home he would require, RW for all mobility, HHPT to optimize mobility in home and improve LE strength to prepare for WB once NWB restrictions lifted, 24/7 assistance, bedside commode to improve independence for toilet transfers and ramp for safe entrance into his home. At this time the pt is agreeable to all of these options, but will require follow up. If he is unwilling to follow any of these d/c recommendations he would require SNF placement.    Follow Up Recommendations  Home health PT;Supervision/Assistance - 24 hour     Equipment Recommendations   (Discussed need for Bariatric 3in1, will follow up with patient about decision to use.)    Recommendations for Other Services        Precautions / Restrictions Precautions Precautions: Fall Restrictions Weight Bearing Restrictions: Yes LLE Weight Bearing: Non weight bearing    Mobility  Bed Mobility Overal bed mobility: Modified Independent (use of bed railings and features. Reports sleeping in recliner at baseline.) Bed Mobility: Supine to Sit     Supine to sit: Modified independent (Device/Increase time);HOB elevated Sit to supine: Supervision;HOB elevated        Transfers Overall transfer level: Needs assistance Equipment used: Rolling walker (2 wheeled) Transfers: Stand Pivot Transfers Sit to Stand: Min guard;From elevated surface Stand pivot transfers: Supervision       General transfer comment: Pt educated on technique for stand pivot transfer with RW. He is agreeable to trial. Demonstrates completing with Min guard-supervision assistance.  Ambulation/Gait             General Gait Details: Pt currently unable to safely maintain NWB LLE for gait training.   Stairs             Wheelchair Mobility    Modified Rankin (Stroke Patients Only)       Balance Overall balance assessment: Mild deficits observed, not formally tested Sitting-balance support: Feet supported Sitting balance-Leahy Scale: Normal     Standing balance support: Bilateral upper extremity supported Standing balance-Leahy Scale: Fair                              Cognition Arousal/Alertness: Awake/alert Behavior During Therapy: WFL for tasks assessed/performed Overall Cognitive Status: Within Functional Limits for tasks assessed  Exercises      General Comments        Pertinent Vitals/Pain Pain Assessment: No/denies pain    Home Living                      Prior Function            PT Goals (current goals can now be found in the care plan section) Acute Rehab PT Goals Patient Stated Goal: Get back to walking PT Goal  Formulation: With patient Time For Goal Achievement: 01/01/21 Potential to Achieve Goals: Fair Progress towards PT goals: Progressing toward goals    Frequency    7X/week      PT Plan Discharge plan needs to be updated    Co-evaluation              AM-PAC PT "6 Clicks" Mobility   Outcome Measure  Help needed turning from your back to your side while in Christopher flat bed without using bedrails?: None Help needed moving from lying on your back to sitting on the side of Christopher flat bed without using bedrails?: Christopher Lot Help needed moving to and from Christopher bed to Christopher chair (including Christopher wheelchair)?: Christopher Little Help needed standing up from Christopher chair using your arms (e.g., wheelchair or bedside chair)?: Christopher Little Help needed to walk in hospital room?: Total Help needed climbing 3-5 steps with Christopher railing? : Total 6 Click Score: 14    End of Session   Activity Tolerance: Patient tolerated treatment well Patient left: in bed;with call bell/phone within reach;with bed alarm set Nurse Communication: Mobility status;Precautions (O2 removed as patient with good O2 saturation during session.) PT Visit Diagnosis: Muscle weakness (generalized) (M62.81);Difficulty in walking, not elsewhere classified (R26.2);Pain;Unsteadiness on feet (R26.81) Pain - Right/Left: Left Pain - part of body: Ankle and joints of foot     Time: [id number] PT Time Calculation (min) (ACUTE ONLY): 56 min  Charges:  $Therapeutic Activity: 53-67 mins                     4:17 PM, 12/20/20 Christopher [name] PT, DPT Physical Therapist - [facility]    Christopher [name] Christopher [name] 12/20/2020, 4:11 PM

## 2020-12-21 LAB — CBC
HCT: 39.7 % (ref 39.0–52.0)
Hemoglobin: 12.6 g/dL — ABNORMAL LOW (ref 13.0–17.0)
MCH: 28.8 pg (ref 26.0–34.0)
MCHC: 31.7 g/dL (ref 30.0–36.0)
MCV: 90.6 fL (ref 80.0–100.0)
Platelets: 233 10*3/uL (ref 150–400)
RBC: 4.38 MIL/uL (ref 4.22–5.81)
RDW: 14.6 % (ref 11.5–15.5)
WBC: 7.3 10*3/uL (ref 4.0–10.5)
nRBC: 0 % (ref 0.0–0.2)

## 2020-12-21 LAB — BASIC METABOLIC PANEL
Anion gap: 10 (ref 5–15)
BUN: 35 mg/dL — ABNORMAL HIGH (ref 8–23)
CO2: 31 mmol/L (ref 22–32)
Calcium: 8.8 mg/dL — ABNORMAL LOW (ref 8.9–10.3)
Chloride: 98 mmol/L (ref 98–111)
Creatinine, Ser: 1.55 mg/dL — ABNORMAL HIGH (ref 0.61–1.24)
GFR, Estimated: 48 mL/min — ABNORMAL LOW (ref >60)
Glucose, Bld: 99 mg/dL (ref 70–99)
Potassium: 3.9 mmol/L (ref 3.5–5.1)
Sodium: 139 mmol/L (ref 135–145)

## 2020-12-21 LAB — MAGNESIUM: Magnesium: 2 mg/dL (ref 1.7–2.4)

## 2020-12-21 NOTE — Plan of Care (Signed)

## 2020-12-21 NOTE — Progress Notes (Signed)
PROGRESS NOTE    Dal Flaugh  [id number] DOB: 09-26-1950 DOA: 12/16/2020 PCP: Rusty Aus, MD  [id number]/[id number]   Assessment & Plan:   Principal Problem:   Closed left ankle fracture Active Problems:   OSA on CPAP   Morbid obesity with BMI of 50.0-59.9, adult (HCC)   Hyperlipidemia, mixed   Depression   Hypothyroid   Chronic diastolic CHF (congestive heart failure) ([place])   Trea Kenworthy is a 70 y.o. male with medical history significant for hypertension, heart failure preserved ejection fraction, OSA on CPAP, morbid obesity, hypothyroid, depression, presents to the emergency department for chief concerns of left ankle fracture.  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   Subjective and Interval History:  No new complaints.  Normal oral intake.  Making progress with PT.   Objective: Vitals:   12/20/20 2003 12/21/20 0413 12/21/20 0829 12/21/20 1623  BP: 108/63 103/64 114/71 117/62  Pulse: 75 77 83 78  Resp:  '18 18 18  '$ Temp: 98.2 F (36.8 C) 98.2 F  (36.8 C) 98 F (36.7 C) 98.2 F (36.8 C)  TempSrc: Oral Oral    SpO2: 92% 91% 90% 92%  Weight:      Height:        Intake/Output Summary (Last 24 hours) at 12/21/2020 1726 Last data filed at 12/21/2020 1013 Gross per 24 hour  Intake 360 ml  Output 1250 ml  Net -890 ml   Filed Weights   12/16/20 1505 12/17/20 1434  Weight: (!) 167.8 kg (!) 167.8 kg    Examination:   Constitutional: NAD, AAOx3 HEENT: conjunctivae and lids normal, EOMI CV: No cyanosis.   RESP: normal respiratory effort, on RA Extremities: cast over left ankle.  RLE chronic venous stasis changes SKIN: warm, dry Neuro: II - XII grossly intact.   Psych: Normal mood and affect.  Appropriate judgement and reason   Data Reviewed: I have personally reviewed following labs and imaging studies  CBC: Recent Labs  Lab 12/16/20 1547 12/17/20 0555 12/18/20 0616 12/19/20 0637 12/20/20 0543 12/21/20 0452  WBC 8.9 7.2 12.4* 7.0 6.5 7.3  NEUTROABS 6.6  --   --   --   --   --   HGB 13.3 12.2* 13.2 11.8* 11.4* 12.6*  HCT 41.5 38.9* 40.9 38.0* 36.4* 39.7  MCV 90.0 90.0 89.3 91.1 91.9 90.6  PLT 225 204 226 218 211 0000000   Basic Metabolic Panel: Recent Labs  Lab 12/17/20 0555 12/18/20 0616 12/19/20 0637 12/20/20 0543 12/21/20 0452  NA 138 139 140 140 139  K 3.7 4.3 3.7 3.6 3.9  CL 94* 95* 95*  97* 98  CO2 34* 30 35* 32 31  GLUCOSE 100* 122* 94 95 99  BUN 40* 37* 37* 37* 35*  CREATININE 1.96* 1.77* 1.91* 1.65* 1.55*  CALCIUM 8.8* 9.0 8.7* 8.9 8.8*  MG  --  2.0 2.0 2.0 2.0   GFR: Estimated Creatinine Clearance: 71.3 mL/min (A) (by C-G formula based on SCr of 1.55 mg/dL (H)). Liver Function Tests: No results for input(s): AST, ALT, ALKPHOS, BILITOT, PROT, ALBUMIN in the last 168 hours. No results for input(s): LIPASE, AMYLASE in the last 168 hours. No results for input(s): AMMONIA in the last 168 hours. Coagulation Profile: Recent Labs  Lab 12/16/20 1547  INR 1.1   Cardiac Enzymes: No results for  input(s): CKTOTAL, CKMB, CKMBINDEX, TROPONINI in the last 168 hours. BNP (last 3 results) No results for input(s): PROBNP in the last 8760 hours. HbA1C: No results for input(s): HGBA1C in the last 72 hours. CBG: No results for input(s): GLUCAP in the last 168 hours. Lipid Profile: No results for input(s): CHOL, HDL, LDLCALC, TRIG, CHOLHDL, LDLDIRECT in the last 72 hours. Thyroid Function Tests: No results for input(s): TSH, T4TOTAL, FREET4, T3FREE, THYROIDAB in the last 72 hours. Anemia Panel: No results for input(s): VITAMINB12, FOLATE, FERRITIN, TIBC, IRON, RETICCTPCT in the last 72 hours. Sepsis Labs: No results for input(s): PROCALCITON, LATICACIDVEN in the last 168 hours.  Recent Results (from the past 240 hour(s))  Resp Panel by RT-PCR (Flu A&B, Covid) Nasopharyngeal Swab     Status: None   Collection Time: 12/16/20  4:05 PM   Specimen: Nasopharyngeal Swab; Nasopharyngeal(NP) swabs in vial transport medium  Result Value Ref Range Status   SARS Coronavirus 2 by RT PCR NEGATIVE NEGATIVE Final    Comment: (NOTE) SARS-CoV-2 target nucleic acids are NOT DETECTED.  The SARS-CoV-2 RNA is generally detectable in upper respiratory specimens during the acute phase of infection. The lowest concentration of SARS-CoV-2 viral copies this assay can detect is 138 copies/mL. A negative result does not preclude SARS-Cov-2 infection and should not be used as the sole basis for treatment or other patient management decisions. A negative result may occur with  improper specimen collection/handling, submission of specimen other than nasopharyngeal swab, presence of viral mutation(s) within the areas targeted by this assay, and inadequate number of viral copies(<138 copies/mL). A negative result must be combined with clinical observations, patient history, and epidemiological information. The expected result is Negative.  Fact Sheet for Patients:   EntrepreneurPulse.com.au  Fact Sheet for Healthcare Providers:  IncredibleEmployment.be  This test is no t yet approved or cleared by the [place] FDA and  has been authorized for detection and/or diagnosis of SARS-CoV-2 by FDA under an Emergency Use Authorization (EUA). This EUA will remain  in effect (meaning this test can be used) for the duration of the COVID-19 declaration under Section 564(b)(1) of the Act, 21 U.S.C.section 360bbb-3(b)(1), unless the authorization is terminated  or revoked sooner.       Influenza A by PCR NEGATIVE NEGATIVE Final   Influenza B by PCR NEGATIVE NEGATIVE Final    Comment: (NOTE) The Xpert Xpress SARS-CoV-2/FLU/RSV plus assay is intended as an aid in the diagnosis of influenza from Nasopharyngeal swab specimens and should not be used as a sole basis for treatment. Nasal washings and aspirates are unacceptable for Xpert Xpress SARS-CoV-2/FLU/RSV testing.  Fact Sheet for Patients: EntrepreneurPulse.com.au  Fact Sheet for Healthcare Providers: IncredibleEmployment.be  This test is not yet approved or cleared by the [place] FDA and has  been authorized for detection and/or diagnosis of SARS-CoV-2 by FDA under an Emergency Use Authorization (EUA). This EUA will remain in effect (meaning this test can be used) for the duration of the COVID-19 declaration under Section 564(b)(1) of the Act, 21 U.S.C. section 360bbb-3(b)(1), unless the authorization is terminated or revoked.  Performed at [facility], [address], [place], [place] [address]       Radiology Studies: No results found.   Scheduled Meds: . atorvastatin  20 mg Oral Daily  . buPROPion  150 mg Oral BID  . cholecalciferol  1,000 Units Oral Daily  . enoxaparin (LOVENOX) injection  0.5 mg/kg Subcutaneous Q24H  . gabapentin  300 mg Oral TID  . levothyroxine  88 mcg Oral Daily  .  potassium chloride  10 mEq Oral BID  . senna-docusate  1 tablet Oral QHS   Continuous Infusions:   LOS: 5 days     Enzo Bi, MD [facility] If 7PM-7AM, please contact night-coverage 12/21/2020, 5:26 PM

## 2020-12-21 NOTE — Progress Notes (Addendum)
Physical Therapy Treatment Patient Details Name: Christopher [name] MRN: [id number] DOB: [birth year] Today's Date: 12/21/2020    History of Present Illness 70 y.o. male with medical history significant for hypertension, heart failure preserved ejection fraction, OSA on CPAP, morbid obesity, hypothyroid, depression, presents to the emergency department for chief concerns of left ankle fracture.  ~6 weeks ago he fell in bathroom and suffered L ankle fracture, was being managed non-surgically (apparently he was ineffectively trying to keep weight off of it/wearing boot).  Pt again fell in the bathroom, apparently worsenting the fracture site and ultimatley needed ORIF 4/27    PT Comments    Patient agreeable to participate in PT. Session focused on improving transfer ability to allow him to get to<> from w/c at home for improved household mobility. Patient practiced pivot transfer with no AD bed to chair with supervision but was unable to complete without breaking his weight bearing precautions slightly. Continued to practice transfers using bariatric RW (adjusted height to proper level for improved mechanics), but continued to put small amount of weight through L LE while taking small steps with R foot. Educated patient on definition of NWB and status prescribed by Psychologist, sport and exercise. Patient expressed the opinion that it was unreasonable to expect him to transfer without putting any weight on that foot due to his size. He stated he felt he was not injuring it and it was not unreasonably painful. Also discussed possible use of axial crutches that patient feels would be easier for him to use. He is expecting his faimly to bring them so they can be practiced with at future sessions. Patient did not feel that [facility] would be helpful and that he should be able to get into his bathroom with his crutches at home. SpO2 remained in 90s throughout session on RA.   Patient would benefit from continued management of limiting condition by  skilled physical therapist to address remaining impairments and functional limitations to work towards stated goals and return to PLOF or maximal functional independence.        Follow Up Recommendations  Home health PT;Supervision/Assistance - 24 hour     Equipment Recommendations   (Discussed need for Bariatric 3in1.)    Recommendations for Other Services       Precautions / Restrictions Precautions Precautions: Fall Restrictions Weight Bearing Restrictions: Yes LLE Weight Bearing: Non weight bearing    Mobility  Bed Mobility Overal bed mobility: Modified Independent (use of bed railings and features. Reports sleeping in recliner at baseline.) Bed Mobility: Supine to Sit     Supine to sit: Modified independent (Device/Increase time);HOB elevated Sit to supine: Supervision;HOB elevated   General bed mobility comments: reliance on bed rails, HOB significantly elevated, extra time to fully upright trunk to sitting EOB    Transfers Overall transfer level: Needs assistance Equipment used: Rolling walker (2 wheeled);None Transfers: Stand Pivot Transfers Sit to Stand: Min guard;From elevated surface Stand pivot transfers: Supervision       General transfer comment: Patient completed stand pivot transfer bed to chair with no AD, using BUE on chair with SBA and cuing for safety. Patient then completed chair <> elevated bed sit <> stand transfer and sit <> stand at edge of chair x 2 reps wtih RW and CGA. Patient observed to be unable to no not put mild wieght through L LE and he stated he does not see how he can be expected to complete any transfers without a little weight through foot. States he does not feel  he is hurting anything and has not significant pain with it. He would like to transfer with his crutches that his family is bringing today. Ajusted bariatric RW to more appropriate height for improved mechanics.  Ambulation/Gait             General Gait Details: Pt  currently unable to safely maintain NWB LLE for gait training.   Stairs             Wheelchair Mobility    Modified Rankin (Stroke Patients Only)       Balance Overall balance assessment: Mild deficits observed, not formally tested Sitting-balance support: Feet supported Sitting balance-Leahy Scale: Normal     Standing balance support: Bilateral upper extremity supported Standing balance-Leahy Scale: Poor Standing balance comment: dependent on RW for safety.                            Cognition Arousal/Alertness: Awake/alert Behavior During Therapy: WFL for tasks assessed/performed Overall Cognitive Status: Within Functional Limits for tasks assessed                                        Exercises General Exercises - Lower Extremity Ankle Circles/Pumps: Seated;AROM;Right;10 reps Long Arc Quad: Seated;AROM;Strengthening;Right;Left;10 reps Hip ABduction/ADduction: Seated;AROM;Right;Left;10 reps (pillow squeeze between knees, 5 second hold) Hip Flexion/Marching: Seated;15 reps;Right;Left;AROM (no back support) Other Exercises Other Exercises: educated patient on meaning of NWB status. Discussed how he could get around his home safely and the importance of using appropriate DME.    General Comments        Pertinent Vitals/Pain Pain Assessment: No/denies pain    Home Living                      Prior Function            PT Goals (current goals can now be found in the care plan section) Acute Rehab PT Goals Patient Stated Goal: Get back to walking PT Goal Formulation: With patient Time For Goal Achievement: 01/01/21 Potential to Achieve Goals: Fair Progress towards PT goals: Not progressing toward goals - comment (continues to put small amount of weight through L LE despite cuing and explanation by PT of NWB order.)    Frequency    7X/week      PT Plan Discharge plan needs to be updated    Co-evaluation               AM-PAC PT "6 Clicks" Mobility   Outcome Measure  Help needed turning from your back to your side while in a flat bed without using bedrails?: A Little Help needed moving from lying on your back to sitting on the side of a flat bed without using bedrails?: A Lot Help needed moving to and from a bed to a chair (including a wheelchair)?: A Little Help needed standing up from a chair using your arms (e.g., wheelchair or bedside chair)?: A Little Help needed to walk in hospital room?: Total Help needed climbing 3-5 steps with a railing? : Total 6 Click Score: 13    End of Session Equipment Utilized During Treatment: Gait belt Activity Tolerance: Patient tolerated treatment well Patient left: in bed;with call bell/phone within reach;with bed alarm set Nurse Communication: Mobility status;Precautions (O2 removed as patient with good O2 saturation during session.) PT Visit Diagnosis: Muscle weakness (generalized) (  M62.81);Difficulty in walking, not elsewhere classified (R26.2);Pain;Unsteadiness on feet (R26.81) Pain - Right/Left: Left Pain - part of body: Ankle and joints of foot     Time: 1245-1310 PT Time Calculation (min) (ACUTE ONLY): 25 min  Charges:  $Therapeutic Activity: 23-37 mins                     Everlean Alstrom. Graylon Good, PT, DPT 12/21/20, 1:34 PM

## 2020-12-22 LAB — CBC
HCT: 39 % (ref 39.0–52.0)
Hemoglobin: 12.3 g/dL — ABNORMAL LOW (ref 13.0–17.0)
MCH: 28.7 pg (ref 26.0–34.0)
MCHC: 31.5 g/dL (ref 30.0–36.0)
MCV: 90.9 fL (ref 80.0–100.0)
Platelets: 243 10*3/uL (ref 150–400)
RBC: 4.29 MIL/uL (ref 4.22–5.81)
RDW: 14.8 % (ref 11.5–15.5)
WBC: 8.6 10*3/uL (ref 4.0–10.5)
nRBC: 0 % (ref 0.0–0.2)

## 2020-12-22 LAB — BASIC METABOLIC PANEL
Anion gap: 10 (ref 5–15)
BUN: 31 mg/dL — ABNORMAL HIGH (ref 8–23)
CO2: 34 mmol/L — ABNORMAL HIGH (ref 22–32)
Calcium: 8.9 mg/dL (ref 8.9–10.3)
Chloride: 96 mmol/L — ABNORMAL LOW (ref 98–111)
Creatinine, Ser: 1.73 mg/dL — ABNORMAL HIGH (ref 0.61–1.24)
GFR, Estimated: 42 mL/min — ABNORMAL LOW (ref >60)
Glucose, Bld: 103 mg/dL — ABNORMAL HIGH (ref 70–99)
Potassium: 3.8 mmol/L (ref 3.5–5.1)
Sodium: 140 mmol/L (ref 135–145)

## 2020-12-22 LAB — MAGNESIUM: Magnesium: 1.8 mg/dL (ref 1.7–2.4)

## 2020-12-22 MED ORDER — METOLAZONE 2.5 MG PO TABS: ORAL_TABLET | ORAL | Status: DC

## 2020-12-22 MED ORDER — FUROSEMIDE 40 MG PO TABS: ORAL_TABLET | ORAL | Status: DC

## 2020-12-22 NOTE — TOC Progression Note (Signed)
Transition of Care [facility]) - Progression Note    Patient Details  Name: Christopher [name] MRN: [id number] Date of Birth: Dec 24, 1950  Transition of Care [facility]) CM/SW [place], RN Phone Number: 12/22/2020, 1:28 PM  Clinical Narrative:   The patient needs EMS to get into his home due to non weight bearing status and having steps,  He understands there could be a bill associated with the EMS He will need a 3 in 1, I notified Rhonda with [employer] and it will be brought into the room, he is set up with [facility] for home health and he stated understanding         Expected Discharge Plan and Services           Expected Discharge Date: 12/22/20                                     Social Determinants of Health (SDOH) Interventions    Readmission Risk Interventions No flowsheet data found.

## 2020-12-22 NOTE — Progress Notes (Signed)
Physical Therapy Treatment Patient Details Name: Christopher [name] MRN: [id number] DOB: July 20, 1951 Today's Date: 12/22/2020    History of Present Illness 70 y.o. male with medical history significant for hypertension, heart failure preserved ejection fraction, OSA on CPAP, morbid obesity, hypothyroid, depression, presents to the emergency department for chief concerns of left ankle fracture.  ~6 weeks ago he fell in bathroom and suffered L ankle fracture, was being managed non-surgically (apparently he was ineffectively trying to keep weight off of it/wearing boot).  Pt again fell in the bathroom, apparently worsenting the fracture site and ultimatley needed ORIF 4/27    PT Comments    Pt seen for PT treatment with focus on transfers & d/c planning. PT continues to educate pt on NWB LLE & most safe transfer method to maintain precautions. PT encourages pt to perform lateral scoot transfers & use w/c for mobility upon d/c home with pt reporting he has a w/c & plans to either rent a ramp or have non emergent EMS transport home. Also recommending a drop arm bariatric BSC for pt to use at home. Continued to practice sit<>stand & stand pivot transfers during session with pt demonstrating poor safety awareness even with education/cuing for hand placement when using RW & pt completing stand pivot transfer bed>recliner with RW, recliner>bed with axillary crutches with pt unable to maintain NWB LLE no matter what AD he uses. Pt reports he feels he does better with crutches with PT educating him he is unable to maintain NWB with either device & again encourages lateral scoot transfers, which pt completes bed>chair with supervision. Will continue to follow pt acutely to progress transfers & for ongoing education.  Discussed ability for pt to work on stand pivot transfer w/c<>recliner at home with HHPT f/u. Pt reports his daughter will be home to assist him.     Follow Up Recommendations  Home health  PT;Supervision/Assistance - 24 hour     Equipment Recommendations   (drop arm bariatric BSC)    Recommendations for Other Services       Precautions / Restrictions Precautions Precautions: Fall Restrictions Weight Bearing Restrictions: Yes LLE Weight Bearing: Non weight bearing    Mobility  Bed Mobility Overal bed mobility: Modified Independent Bed Mobility: Supine to Sit     Supine to sit: Modified independent (Device/Increase time);HOB elevated     General bed mobility comments: reliance on bed rails    Transfers Overall transfer level: Needs assistance Equipment used: Rolling walker (2 wheeled);None Transfers: Sit to/from [facility];Lateral/Scoot Transfers Sit to Stand: Min guard (Cuing/education for safe hand placement but pt continues to have BUE on RW during sit>stand.) Stand pivot transfers: Min guard (Pt unable to maintain NWB LLE during transfers with RW or axillary crutches.)      Lateral/Scoot Transfers: Supervision (education/cuing for head/hips relationship)    Ambulation/Gait                 Stairs             Wheelchair Mobility    Modified Rankin (Stroke Patients Only)       Balance Overall balance assessment: Needs assistance Sitting-balance support: Feet supported;Bilateral upper extremity supported Sitting balance-Leahy Scale: Good     Standing balance support: Bilateral upper extremity supported;During functional activity Standing balance-Leahy Scale: Poor Standing balance comment: dependent on RW for safety.  Cognition Arousal/Alertness: Awake/alert Behavior During Therapy: WFL for tasks assessed/performed Overall Cognitive Status: Within Functional Limits for tasks assessed                                 General Comments: pt appears WFL in regards to cognition but nurse confirms pt should be on supplemental O2 at all times after pt has repeatedly  told this PT he only uses it PRN at home.      Exercises General Exercises - Lower Extremity Long Arc Quad: Seated;AROM;Strengthening;Left;10 reps    General Comments General comments (skin integrity, edema, etc.): Pt doffs nasal cannula set at 1.5L/min. SpO2 90% or > throughout session on room air & pt left on room air at end of session as pt reports he uses supplemental O2 intermittently at home. Nurse notified who reports pt is in face on supplemental O2 at all times at home & reports she will go don pt's nasal cannula.      Pertinent Vitals/Pain Pain Assessment: No/denies pain    Home Living                      Prior Function            PT Goals (current goals can now be found in the care plan section) Acute Rehab PT Goals Patient Stated Goal: Get back to walking PT Goal Formulation: With patient Time For Goal Achievement: 01/01/21 Potential to Achieve Goals: Fair Progress towards PT goals: Progressing toward goals    Frequency    7X/week      PT Plan Current plan remains appropriate    Co-evaluation              AM-PAC PT "6 Clicks" Mobility   Outcome Measure  Help needed turning from your back to your side while in a flat bed without using bedrails?: A Little Help needed moving from lying on your back to sitting on the side of a flat bed without using bedrails?: A Lot Help needed moving to and from a bed to a chair (including a wheelchair)?: A Little Help needed standing up from a chair using your arms (e.g., wheelchair or bedside chair)?: A Little Help needed to walk in hospital room?: Total Help needed climbing 3-5 steps with a railing? : Total 6 Click Score: 13    End of Session Equipment Utilized During Treatment: Gait belt Activity Tolerance: Patient tolerated treatment well Patient left: in chair;with call bell/phone within reach;with chair alarm set Nurse Communication: Mobility status (O2) PT Visit Diagnosis: Muscle weakness  (generalized) (M62.81);Difficulty in walking, not elsewhere classified (R26.2);Pain;Unsteadiness on feet (R26.81) Pain - Right/Left: Left Pain - part of body: Ankle and joints of foot     Time: 0947-1010 PT Time Calculation (min) (ACUTE ONLY): 23 min  Charges:  $Therapeutic Activity: 23-37 mins                     Lavone Nian, PT, DPT 12/22/20, 10:23 AM    Waunita Schooner 12/22/2020, 10:19 AM

## 2020-12-22 NOTE — Progress Notes (Signed)
Pt provided discharge instructions with daughter present. Educational handouts for heart failure provided and all questions answered at this time All belongings given to daughter and taken home. Pt transferred home by EMS  12/22/20 1609  Assess: MEWS Score  Temp 98 F (36.7 C)  BP 119/66  Pulse Rate 75  Resp 18  Level of Consciousness Alert  SpO2 93 %  O2 Device Room Air

## 2020-12-22 NOTE — Plan of Care (Signed)

## 2020-12-22 NOTE — TOC Progression Note (Signed)
Transition of Care [facility]) - Progression Note    Patient Details  Name: Christopher [name] MRN: [id number] Date of Birth: 12-Jun-1951  Transition of Care [facility]) CM/SW [place], RN Phone Number: 12/22/2020, 10:44 AM  Clinical Narrative:   Met with the patient at the bedside, he stated that he is not going to go to rehab he is going to go home, he stated that he has all the DME at home but does need [place] set up, I contacted [facility] and requested that they accept the patient, awaiting a repsoce         Expected Discharge Plan and Services                                                 Social Determinants of Health (SDOH) Interventions    Readmission Risk Interventions No flowsheet data found.

## 2020-12-22 NOTE — Discharge Summary (Signed)
Physician Discharge Summary   Christopher [name]  male DOB: February 26, 1951  [id number]  PCP: Rusty Aus, MD  Admit date: 12/16/2020 Discharge date: 12/22/2020  Admitted From: home Disposition:  home Home Health: Yes CODE STATUS: Full code  Discharge Instructions    Discharge instructions   Complete by: As directed    nonweightbearing to left foot.  followup with podiatry in 2 weeks   Dr. Enzo Bi - -   No wound care   Complete by: As directed        Hospital Course:  For full details, please see H&P, progress notes, consult notes and ancillary notes.  Briefly,  Christopher Richmondis a 70 y.o.malewith medical history significant forhypertension, heart failure preserved ejection fraction, OSA on CPAP, morbid obesity, hypothyroid, depression, presented to the emergency department for chief concerns of left ankle fracture.  # Closed left medial malleolar ankle fracture S/p ORIF on 12/17/20  -Secondary to mechanical fall x 2 --nonweightbearing to left foot --Initially planned for discharge to SNF rehab, however, pt made progress with PT while waiting on insurance auth, and then decided to go home instead.   --followup with podiatry Dr. Vickki Muff in 2 weeks in clinic.  #Hypertension --BP has been soft --home metolazone and lasix held pending outpatient followup.  #Hyperlipidemia -cont statin  #Depression/anxiety -cont bupropion  #Hypothyroid -cont Synthroid  #Grade 1 diastolic dysfunction -currently euvolemic and compensated at this time --home metolazone and lasix held due to low BP  #CKD 3B -at baseline --home metolazone and lasix held due to low BP   #Neuropathy -cont gabapentin  #Obstructive sleep apnea -CPAP nightly ordered --encourage incentive spirometry   #Morbid obesity, BMI 50   Discharge Diagnoses:  Principal Problem:   Closed left ankle fracture Active Problems:   OSA on CPAP   Morbid obesity with BMI of 50.0-59.9,  adult (HCC)   Hyperlipidemia, mixed   Depression   Hypothyroid   Chronic diastolic CHF (congestive heart failure) ([place])   30 Day Unplanned Readmission Risk Score   Flowsheet Row ED to Hosp-Admission (Current) from 12/16/2020 in [facility] (1A)  30 Day Unplanned Readmission Risk Score (%) 11.6 Filed at 12/22/2020 1200     This score is the patient's risk of an unplanned readmission within 30 days of being discharged (0 -100%). The score is based on dignosis, age, lab data, medications, orders, and past utilization.   Low:  0-14.9   Medium: 15-21.9   High: 22-29.9   Extreme: 30 and above        Discharge Instructions:  Allergies as of 12/22/2020      Reactions   Albuterol Other (See Comments)   Causes afib    Grass Extracts [gramineae Pollens] Other (See Comments)   Sneeze and watery eyes   Other    Other reaction(s): Other (See Comments) Unable to breath when around cats      Medication List    STOP taking these medications   oxyCODONE-acetaminophen 5-325 MG tablet Commonly known as: PERCOCET/ROXICET   traMADol 50 MG tablet Commonly known as: ULTRAM     TAKE these medications   aspirin 81 MG chewable tablet Chew 81 mg by mouth daily.   atorvastatin 20 MG tablet Commonly known as: LIPITOR Take 20 mg by mouth daily.   buPROPion 150 MG 12 hr tablet Commonly known as: WELLBUTRIN SR Take 150 mg by mouth 2 (two) times daily.   Cholecalciferol 25 MCG (1000 UT) capsule Take 1,000 Units by mouth daily.  Cyanocobalamin 1000 MCG Lozg Place 1 lozenge under the tongue 2 (two) times a week.   furosemide 40 MG tablet Commonly known as: LASIX Hold until followup with your outpatient doctor due to your kidney function worsening. What changed:   how much to take  how to take this  when to take this  additional instructions   gabapentin 300 MG capsule Commonly known as: NEURONTIN Take 300 mg by mouth 3 (three) times daily. '300mg'$   three times after dinner spread out between hours of dinner and bed   ibuprofen 200 MG tablet Commonly known as: ADVIL Take 400 mg by mouth every 6 (six) hours as needed for mild pain or moderate pain.   levalbuterol 45 MCG/ACT inhaler Commonly known as: XOPENEX HFA Inhale 2 puffs into the lungs every 4 (four) hours as needed for wheezing.   levothyroxine 88 MCG tablet Commonly known as: SYNTHROID Take 88 mcg by mouth daily. 30 to 60 minutes before breakfast on an empty stomach and with a glass of water   metolazone 2.5 MG tablet Commonly known as: ZAROXOLYN Hold until followup with your outpatient doctor due to your kidney function worsening. What changed:   how much to take  how to take this  when to take this  additional instructions   potassium chloride 10 MEQ tablet Commonly known as: KLOR-CON Take 10 mEq by mouth 2 (two) times daily.   PreserVision AREDS 2 Caps Take 1 capsule by mouth 2 (two) times daily.   trolamine salicylate 10 % cream Commonly known as: ASPERCREME Apply 1 application topically as needed for muscle pain. Apply to knees and feet        Follow-up Information    Sharlotte Alamo, DPM. Schedule an appointment as soon as possible for a visit in 2 week(s).   Specialty: Podiatry Contact information: [place] [address] [phone]        Rusty Aus, MD. Schedule an appointment as soon as possible for a visit in 1 week(s).   Specialty: Internal Medicine Contact information: [place] [address] [phone]               Allergies  Allergen Reactions  . Albuterol Other (See Comments)    Causes afib   . Grass Extracts [Gramineae Pollens] Other (See Comments)    Sneeze and watery eyes  . Other     Other reaction(s): Other (See Comments) Unable to breath when around cats     The results of significant diagnostics from this hospitalization  (including imaging, microbiology, ancillary and laboratory) are listed below for reference.   Consultations:   Procedures/Studies: DG Chest Portable 1 View  Result Date: 12/16/2020 CLINICAL DATA:  Preoperative examination. Patient for fixation of left foot fractures. EXAM: PORTABLE CHEST 1 VIEW COMPARISON:  None. FINDINGS: Lungs are clear. Heart size is normal. No pneumothorax or pleural fluid. No acute or focal bony abnormality. IMPRESSION: No acute disease. Electronically Signed   By: Inge Rise M.D.   On: 12/16/2020 16:20   DG MINI C-ARM IMAGE ONLY  Result Date: 12/17/2020 There is no interpretation for this exam.  This order is for images obtained during a surgical procedure.  Please See "Surgeries" Tab for more information regarding the procedure.      Labs: BNP (last 3 results) Recent Labs    12/16/20 1547  BNP [id number]   Basic Metabolic Panel: Recent Labs  Lab 12/18/20 0616 12/19/20 0637 12/20/20 0543  12/21/20 0452 12/22/20 0523  NA 139 140 140 139 140  K 4.3 3.7 3.6 3.9 3.8  CL 95* 95* 97* 98 96*  CO2 30 35* 32 31 34*  GLUCOSE 122* 94 95 99 103*  BUN 37* 37* 37* 35* 31*  CREATININE 1.77* 1.91* 1.65* 1.55* 1.73*  CALCIUM 9.0 8.7* 8.9 8.8* 8.9  MG 2.0 2.0 2.0 2.0 1.8   Liver Function Tests: No results for input(s): AST, ALT, ALKPHOS, BILITOT, PROT, ALBUMIN in the last 168 hours. No results for input(s): LIPASE, AMYLASE in the last 168 hours. No results for input(s): AMMONIA in the last 168 hours. CBC: Recent Labs  Lab 12/16/20 1547 12/17/20 0555 12/18/20 0616 12/19/20 0637 12/20/20 0543 12/21/20 0452 12/22/20 0523  WBC 8.9   < > 12.4* 7.0 6.5 7.3 8.6  NEUTROABS 6.6  --   --   --   --   --   --   HGB 13.3   < > 13.2 11.8* 11.4* 12.6* 12.3*  HCT 41.5   < > 40.9 38.0* 36.4* 39.7 39.0  MCV 90.0   < > 89.3 91.1 91.9 90.6 90.9  PLT 225   < > 226 218 211 233 243   < > = values in this interval not displayed.   Cardiac Enzymes: No results for input(s):  CKTOTAL, CKMB, CKMBINDEX, TROPONINI in the last 168 hours. BNP: Invalid input(s): POCBNP CBG: No results for input(s): GLUCAP in the last 168 hours. D-Dimer No results for input(s): DDIMER in the last 72 hours. Hgb A1c No results for input(s): HGBA1C in the last 72 hours. Lipid Profile No results for input(s): CHOL, HDL, LDLCALC, TRIG, CHOLHDL, LDLDIRECT in the last 72 hours. Thyroid function studies No results for input(s): TSH, T4TOTAL, T3FREE, THYROIDAB in the last 72 hours.  Invalid input(s): FREET3 Anemia work up No results for input(s): VITAMINB12, FOLATE, FERRITIN, TIBC, IRON, RETICCTPCT in the last 72 hours. Urinalysis No results found for: COLORURINE, APPEARANCEUR, [place], [place], GLUCOSEU, [place], [place], [address], PROTEINUR, UROBILINOGEN, NITRITE, LEUKOCYTESUR Sepsis Labs Invalid input(s): PROCALCITONIN,  WBC,  LACTICIDVEN Microbiology Recent Results (from the past 240 hour(s))  Resp Panel by RT-PCR (Flu A&B, Covid) Nasopharyngeal Swab     Status: None   Collection Time: 12/16/20  4:05 PM   Specimen: Nasopharyngeal Swab; Nasopharyngeal(NP) swabs in vial transport medium  Result Value Ref Range Status   SARS Coronavirus 2 by RT PCR NEGATIVE NEGATIVE Final    Comment: (NOTE) SARS-CoV-2 target nucleic acids are NOT DETECTED.  The SARS-CoV-2 RNA is generally detectable in upper respiratory specimens during the acute phase of infection. The lowest concentration of SARS-CoV-2 viral copies this assay can detect is 138 copies/mL. A negative result does not preclude SARS-Cov-2 infection and should not be used as the sole basis for treatment or other patient management decisions. A negative result may occur with  improper specimen collection/handling, submission of specimen other than nasopharyngeal swab, presence of viral mutation(s) within the areas targeted by this assay, and inadequate number of viral copies(<138 copies/mL). A negative result must be combined  with clinical observations, patient history, and epidemiological information. The expected result is Negative.  Fact Sheet for Patients:  EntrepreneurPulse.com.au  Fact Sheet for Healthcare Providers:  IncredibleEmployment.be  This test is no t yet approved or cleared by the [place] FDA and  has been authorized for detection and/or diagnosis of SARS-CoV-2 by FDA under an Emergency Use Authorization (EUA). This EUA will remain  in effect (meaning this test can be used)  for the duration of the COVID-19 declaration under Section 564(b)(1) of the Act, 21 U.S.C.section 360bbb-3(b)(1), unless the authorization is terminated  or revoked sooner.       Influenza A by PCR NEGATIVE NEGATIVE Final   Influenza B by PCR NEGATIVE NEGATIVE Final    Comment: (NOTE) The Xpert Xpress SARS-CoV-2/FLU/RSV plus assay is intended as an aid in the diagnosis of influenza from Nasopharyngeal swab specimens and should not be used as a sole basis for treatment. Nasal washings and aspirates are unacceptable for Xpert Xpress SARS-CoV-2/FLU/RSV testing.  Fact Sheet for Patients: EntrepreneurPulse.com.au  Fact Sheet for Healthcare Providers: IncredibleEmployment.be  This test is not yet approved or cleared by the [place] FDA and has been authorized for detection and/or diagnosis of SARS-CoV-2 by FDA under an Emergency Use Authorization (EUA). This EUA will remain in effect (meaning this test can be used) for the duration of the COVID-19 declaration under Section 564(b)(1) of the Act, 21 U.S.C. section 360bbb-3(b)(1), unless the authorization is terminated or revoked.  Performed at [facility], [address] [place], [place] [address]      Total time spend on discharging this patient, including the last patient exam, discussing the hospital stay, instructions for ongoing care as it relates to all pertinent  caregivers, as well as preparing the medical discharge records, prescriptions, and/or referrals as applicable, is 35 minutes.    Enzo Bi, MD  [facility] 12/22/2020, 12:49 PM

## 2020-12-22 NOTE — TOC Progression Note (Signed)
Transition of Care [facility]) - Progression Note    Patient Details  Name: Quadarrius Dobies MRN: [id number] Date of Birth: [birth year]  Transition of Care [facility]) CM/SW [place], RN Phone Number: 12/22/2020, 11:15 AM  Clinical Narrative:   [facility] has notified me that they are not able to accept, [facility] has notified me that they are unable to accept, [facility] has notified me that they are not able to take, [facility] has agreed to accept the patient        Expected Discharge Plan and Services                                                 Social Determinants of Health (SDOH) Interventions    Readmission Risk Interventions No flowsheet data found.

## 2020-12-22 NOTE — Care Management Important Message (Signed)
Important Message  Patient Details  Name: Christopher [name] MRN: [id number] Date of Birth: January 29, 1951   Medicare Important Message Given:  Yes     [name] 12/22/2020, 2:16 PM

## 2020-12-22 NOTE — TOC Progression Note (Signed)
Transition of Care [facility]) - Progression Note    Patient Details  Name: Christopher [name] MRN: [id number] Date of Birth: Nov 01, 1950  Transition of Care [facility]) CM/SW [place], RN Phone Number: 12/22/2020, 3:27 PM  Clinical Narrative:   Called EMS to transport home, the daughter is here to pick up belonging, the patient requested EMS< the nurse is aware         Expected Discharge Plan and Services           Expected Discharge Date: 12/22/20                                     Social Determinants of Health (SDOH) Interventions    Readmission Risk Interventions No flowsheet data found.

## 2021-03-17 ENCOUNTER — Emergency Department: Payer: Medicare Other

## 2021-03-17 ENCOUNTER — Encounter: Payer: Self-pay | Admitting: Internal Medicine

## 2021-03-17 ENCOUNTER — Other Ambulatory Visit: Payer: Self-pay

## 2021-03-17 ENCOUNTER — Inpatient Hospital Stay
Admission: EM | Admit: 2021-03-17 | Discharge: 2021-03-20 | DRG: 919 | Disposition: A | Discharge status: Skilled Nursing Facility | Payer: Medicare Other | Attending: Internal Medicine | Admitting: Internal Medicine

## 2021-03-17 DIAGNOSIS — G629 Polyneuropathy, unspecified: Secondary | ICD-10-CM | POA: Diagnosis present

## 2021-03-17 DIAGNOSIS — F419 Anxiety disorder, unspecified: Secondary | ICD-10-CM | POA: Diagnosis present

## 2021-03-17 DIAGNOSIS — I872 Venous insufficiency (chronic) (peripheral): Secondary | ICD-10-CM | POA: Diagnosis present

## 2021-03-17 DIAGNOSIS — I878 Other specified disorders of veins: Secondary | ICD-10-CM | POA: Diagnosis present

## 2021-03-17 DIAGNOSIS — I890 Lymphedema, not elsewhere classified: Secondary | ICD-10-CM | POA: Diagnosis present

## 2021-03-17 DIAGNOSIS — J9622 Acute and chronic respiratory failure with hypercapnia: Secondary | ICD-10-CM | POA: Diagnosis present

## 2021-03-17 DIAGNOSIS — T8131XA Disruption of external operation (surgical) wound, not elsewhere classified, initial encounter: Secondary | ICD-10-CM | POA: Diagnosis present

## 2021-03-17 DIAGNOSIS — F32A Depression, unspecified: Secondary | ICD-10-CM | POA: Diagnosis present

## 2021-03-17 DIAGNOSIS — I96 Gangrene, not elsewhere classified: Secondary | ICD-10-CM

## 2021-03-17 DIAGNOSIS — I739 Peripheral vascular disease, unspecified: Secondary | ICD-10-CM | POA: Diagnosis present

## 2021-03-17 DIAGNOSIS — Z7982 Long term (current) use of aspirin: Secondary | ICD-10-CM | POA: Diagnosis not present

## 2021-03-17 DIAGNOSIS — Z6841 Body Mass Index (BMI) 40.0 and over, adult: Secondary | ICD-10-CM

## 2021-03-17 DIAGNOSIS — Z20822 Contact with and (suspected) exposure to covid-19: Secondary | ICD-10-CM | POA: Diagnosis present

## 2021-03-17 DIAGNOSIS — Z79890 Hormone replacement therapy (postmenopausal): Secondary | ICD-10-CM | POA: Diagnosis not present

## 2021-03-17 DIAGNOSIS — Z79899 Other long term (current) drug therapy: Secondary | ICD-10-CM | POA: Diagnosis not present

## 2021-03-17 DIAGNOSIS — E872 Acidosis: Secondary | ICD-10-CM | POA: Diagnosis present

## 2021-03-17 DIAGNOSIS — E785 Hyperlipidemia, unspecified: Secondary | ICD-10-CM | POA: Diagnosis present

## 2021-03-17 DIAGNOSIS — M86172 Other acute osteomyelitis, left ankle and foot: Secondary | ICD-10-CM | POA: Diagnosis not present

## 2021-03-17 DIAGNOSIS — E039 Hypothyroidism, unspecified: Secondary | ICD-10-CM | POA: Diagnosis present

## 2021-03-17 DIAGNOSIS — M79672 Pain in left foot: Secondary | ICD-10-CM | POA: Diagnosis present

## 2021-03-17 DIAGNOSIS — G4733 Obstructive sleep apnea (adult) (pediatric): Secondary | ICD-10-CM

## 2021-03-17 DIAGNOSIS — J9620 Acute and chronic respiratory failure, unspecified whether with hypoxia or hypercapnia: Secondary | ICD-10-CM | POA: Diagnosis present

## 2021-03-17 DIAGNOSIS — G4730 Sleep apnea, unspecified: Secondary | ICD-10-CM

## 2021-03-17 DIAGNOSIS — Y848 Other medical procedures as the cause of abnormal reaction of the patient, or of later complication, without mention of misadventure at the time of the procedure: Secondary | ICD-10-CM | POA: Diagnosis present

## 2021-03-17 DIAGNOSIS — E662 Morbid (severe) obesity with alveolar hypoventilation: Secondary | ICD-10-CM | POA: Diagnosis present

## 2021-03-17 DIAGNOSIS — I5032 Chronic diastolic (congestive) heart failure: Secondary | ICD-10-CM | POA: Diagnosis present

## 2021-03-17 DIAGNOSIS — I5033 Acute on chronic diastolic (congestive) heart failure: Secondary | ICD-10-CM | POA: Diagnosis present

## 2021-03-17 HISTORY — DX: Hypothyroidism, unspecified: E03.9

## 2021-03-17 HISTORY — DX: Depression, unspecified: F32.A

## 2021-03-17 HISTORY — DX: Cardiac arrhythmia, unspecified: I49.9

## 2021-03-17 HISTORY — DX: Sleep apnea, unspecified: G47.30

## 2021-03-17 HISTORY — DX: Heart failure, unspecified: I50.9

## 2021-03-17 HISTORY — DX: Anxiety disorder, unspecified: F41.9

## 2021-03-17 LAB — BLOOD GAS, ARTERIAL
Acid-Base Excess: 15.4 mmol/L — ABNORMAL HIGH (ref 0.0–2.0)
Acid-Base Excess: 15.4 mmol/L — ABNORMAL HIGH (ref 0.0–2.0)
Bicarbonate: 45.9 mmol/L — ABNORMAL HIGH (ref 20.0–28.0)
Bicarbonate: 44.6 mmol/L — ABNORMAL HIGH (ref 20.0–28.0)
Delivery systems: POSITIVE
Expiratory PAP: 5
FIO2: 0.28
FIO2: 0.35
Inspiratory PAP: 10
O2 Saturation: 96.7 %
O2 Saturation: 98.3 %
Patient temperature: 37
Patient temperature: 37
pCO2 arterial: 89 mmHg (ref 32.0–48.0)
pCO2 arterial: 79 mmHg (ref 32.0–48.0)
pH, Arterial: 7.32 — ABNORMAL LOW (ref 7.350–7.450)
pH, Arterial: 7.36 (ref 7.350–7.450)
pO2, Arterial: 94 mmHg (ref 83.0–108.0)
pO2, Arterial: 113 mmHg — ABNORMAL HIGH (ref 83.0–108.0)

## 2021-03-17 LAB — C-REACTIVE PROTEIN: CRP: 1.3 mg/dL — ABNORMAL HIGH (ref <1.0)

## 2021-03-17 LAB — COMPREHENSIVE METABOLIC PANEL
ALT: 7 U/L (ref 0–44)
AST: 11 U/L — ABNORMAL LOW (ref 15–41)
Albumin: 3.9 g/dL (ref 3.5–5.0)
Alkaline Phosphatase: 54 U/L (ref 38–126)
Anion gap: 8 (ref 5–15)
BUN: 31 mg/dL — ABNORMAL HIGH (ref 8–23)
CO2: 40 mmol/L — ABNORMAL HIGH (ref 22–32)
Calcium: 9.4 mg/dL (ref 8.9–10.3)
Chloride: 95 mmol/L — ABNORMAL LOW (ref 98–111)
Creatinine, Ser: 1.5 mg/dL — ABNORMAL HIGH (ref 0.61–1.24)
GFR, Estimated: 50 mL/min — ABNORMAL LOW (ref >60)
Glucose, Bld: 114 mg/dL — ABNORMAL HIGH (ref 70–99)
Potassium: 4.9 mmol/L (ref 3.5–5.1)
Sodium: 143 mmol/L (ref 135–145)
Total Bilirubin: 1.1 mg/dL (ref 0.3–1.2)
Total Protein: 7.5 g/dL (ref 6.5–8.1)

## 2021-03-17 LAB — CBC WITH DIFFERENTIAL/PLATELET
Abs Immature Granulocytes: 0.07 10*3/uL (ref 0.00–0.07)
Basophils Absolute: 0.1 10*3/uL (ref 0.0–0.1)
Basophils Relative: 1 %
Eosinophils Absolute: 0.5 10*3/uL (ref 0.0–0.5)
Eosinophils Relative: 7 %
HCT: 47.8 % (ref 39.0–52.0)
Hemoglobin: 14.1 g/dL (ref 13.0–17.0)
Immature Granulocytes: 1 %
Lymphocytes Relative: 11 %
Lymphs Abs: 0.8 10*3/uL (ref 0.7–4.0)
MCH: 28.8 pg (ref 26.0–34.0)
MCHC: 29.5 g/dL — ABNORMAL LOW (ref 30.0–36.0)
MCV: 97.6 fL (ref 80.0–100.0)
Monocytes Absolute: 0.7 10*3/uL (ref 0.1–1.0)
Monocytes Relative: 9 %
Neutro Abs: 5.5 10*3/uL (ref 1.7–7.7)
Neutrophils Relative %: 71 %
Platelets: 232 10*3/uL (ref 150–400)
RBC: 4.9 MIL/uL (ref 4.22–5.81)
RDW: 15.5 % (ref 11.5–15.5)
WBC: 7.6 10*3/uL (ref 4.0–10.5)
nRBC: 0 % (ref 0.0–0.2)

## 2021-03-17 LAB — LACTIC ACID, PLASMA
Lactic Acid, Venous: 0.7 mmol/L (ref 0.5–1.9)
Lactic Acid, Venous: 0.9 mmol/L (ref 0.5–1.9)

## 2021-03-17 LAB — RESP PANEL BY RT-PCR (FLU A&B, COVID) ARPGX2
Influenza A by PCR: NEGATIVE
Influenza B by PCR: NEGATIVE
SARS Coronavirus 2 by RT PCR: NEGATIVE

## 2021-03-17 LAB — PREALBUMIN: Prealbumin: 14.9 mg/dL — ABNORMAL LOW (ref 18–38)

## 2021-03-17 LAB — PROTIME-INR
INR: 1.1 (ref 0.8–1.2)
Prothrombin Time: 13.9 seconds (ref 11.4–15.2)

## 2021-03-17 LAB — SEDIMENTATION RATE: Sed Rate: 11 mm/hr (ref 0–20)

## 2021-03-17 MED ORDER — VANCOMYCIN HCL IN DEXTROSE 1-5 GM/200ML-% IV SOLN
1000.0000 mg | Freq: Once | INTRAVENOUS | Status: DC
  Filled 2021-03-17: qty 200

## 2021-03-17 MED ORDER — BUPROPION HCL ER (SR) 150 MG PO TB12
150.0000 mg | ORAL_TABLET | Freq: Two times a day (BID) | ORAL | Status: DC
  Administered 2021-03-18 – 2021-03-20 (×5): 150 mg via ORAL
  Filled 2021-03-17 (×7): qty 1

## 2021-03-17 MED ORDER — PRESERVISION AREDS 2 PO CAPS: 1.0000 | ORAL_CAPSULE | Freq: Two times a day (BID) | ORAL | Status: DC

## 2021-03-17 MED ORDER — SODIUM CHLORIDE 0.9 % IV SOLN: 250.0000 mL | INTRAVENOUS | Status: DC | PRN

## 2021-03-17 MED ORDER — HYDROCODONE-ACETAMINOPHEN 5-325 MG PO TABS
1.0000 | ORAL_TABLET | Freq: Four times a day (QID) | ORAL | Status: DC | PRN
Start: 2021-03-17 — End: 2021-03-20

## 2021-03-17 MED ORDER — LEVOTHYROXINE SODIUM 88 MCG PO TABS
88.0000 ug | ORAL_TABLET | Freq: Every day | ORAL | Status: DC
  Administered 2021-03-18 – 2021-03-20 (×3): 88 ug via ORAL
  Filled 2021-03-17 (×4): qty 1

## 2021-03-17 MED ORDER — ONDANSETRON HCL 4 MG PO TABS: 4.0000 mg | ORAL_TABLET | Freq: Four times a day (QID) | ORAL | Status: DC | PRN

## 2021-03-17 MED ORDER — METRONIDAZOLE 500 MG/100ML IV SOLN
500.0000 mg | Freq: Three times a day (TID) | INTRAVENOUS | Status: DC
  Administered 2021-03-18 (×2): 500 mg via INTRAVENOUS
  Filled 2021-03-17 (×4): qty 100

## 2021-03-17 MED ORDER — ATORVASTATIN CALCIUM 20 MG PO TABS
20.0000 mg | ORAL_TABLET | Freq: Every day | ORAL | Status: DC
  Administered 2021-03-18 – 2021-03-20 (×3): 20 mg via ORAL
  Filled 2021-03-17 (×3): qty 1

## 2021-03-17 MED ORDER — SODIUM CHLORIDE 0.9% FLUSH: 3.0000 mL | INTRAVENOUS | Status: DC | PRN

## 2021-03-17 MED ORDER — METOLAZONE 2.5 MG PO TABS: 2.5000 mg | ORAL_TABLET | Freq: Every day | ORAL | Status: DC

## 2021-03-17 MED ORDER — SODIUM CHLORIDE 0.9 % IV SOLN
2.0000 g | INTRAVENOUS | Status: DC
  Administered 2021-03-17: 2 g via INTRAVENOUS
  Filled 2021-03-17 (×2): qty 20

## 2021-03-17 MED ORDER — VITAMIN D 25 MCG (1000 UNIT) PO TABS
1000.0000 [IU] | ORAL_TABLET | Freq: Every day | ORAL | Status: DC
  Administered 2021-03-18 – 2021-03-20 (×3): 1000 [IU] via ORAL
  Filled 2021-03-17 (×3): qty 1

## 2021-03-17 MED ORDER — SODIUM CHLORIDE 0.9% FLUSH
3.0000 mL | Freq: Two times a day (BID) | INTRAVENOUS | Status: DC
  Administered 2021-03-18 – 2021-03-20 (×5): 3 mL via INTRAVENOUS

## 2021-03-17 MED ORDER — SODIUM CHLORIDE 0.9 % IV SOLN
2.0000 g | Freq: Once | INTRAVENOUS | Status: AC
  Administered 2021-03-17: 2 g via INTRAVENOUS
  Filled 2021-03-17: qty 2

## 2021-03-17 MED ORDER — VITAMIN B-12 1000 MCG PO TABS
1000.0000 ug | ORAL_TABLET | ORAL | Status: DC
  Administered 2021-03-18: 1000 ug via ORAL
  Filled 2021-03-17: qty 1

## 2021-03-17 MED ORDER — ONDANSETRON HCL 4 MG/2ML IJ SOLN: 4.0000 mg | Freq: Four times a day (QID) | INTRAMUSCULAR | Status: DC | PRN

## 2021-03-17 MED ORDER — METRONIDAZOLE 500 MG/100ML IV SOLN
500.0000 mg | Freq: Once | INTRAVENOUS | Status: AC
  Administered 2021-03-17: 500 mg via INTRAVENOUS
  Filled 2021-03-17: qty 100

## 2021-03-17 MED ORDER — VANCOMYCIN HCL 2000 MG/400ML IV SOLN
2000.0000 mg | Freq: Once | INTRAVENOUS | Status: AC
  Administered 2021-03-17: 2000 mg via INTRAVENOUS
  Filled 2021-03-17: qty 400

## 2021-03-17 MED ORDER — FUROSEMIDE 40 MG PO TABS
40.0000 mg | ORAL_TABLET | Freq: Every day | ORAL | Status: DC
  Administered 2021-03-18 – 2021-03-20 (×3): 40 mg via ORAL
  Filled 2021-03-17 (×3): qty 1

## 2021-03-17 MED ORDER — GABAPENTIN 300 MG PO CAPS
300.0000 mg | ORAL_CAPSULE | Freq: Three times a day (TID) | ORAL | Status: DC
  Administered 2021-03-18 – 2021-03-20 (×7): 300 mg via ORAL
  Filled 2021-03-17 (×7): qty 1

## 2021-03-17 NOTE — ED Provider Notes (Signed)
[facility] Emergency Department Provider Note    Event Date/Time   First MD Initiated Contact with Patient 03/17/21 1405     (approximate)  I have reviewed the triage vital signs and the nursing notes.   HISTORY  Chief Complaint Foot Pain    HPI Christopher [name] is a 70 y.o. male with below listed past medical history remote history of osteomyelitis presents to the ER for evaluation of worsening foot discoloration pain wound and now draining pus with foul odor.  Was over walk-in clinic today and was directed to the ER due to the severity of the wound.  Reportedly had broken foot that was managed by podiatry several months ago.  Over the past few weeks has had significant decline no longer able to bear weight on the foot.  No measured fevers no recent antibiotics.  According to family also becoming increasingly confused.  No past medical history on file. No family history on file. Past Surgical History:  Procedure Laterality Date   ORIF ANKLE FRACTURE Left 12/17/2020   Procedure: OPEN REDUCTION INTERNAL FIXATION (ORIF) ANKLE FRACTURE;  Surgeon: Samara Deist, DPM;  Location: [facility] ORS;  Service: Podiatry;  Laterality: Left;   Patient Active Problem List   Diagnosis Date Noted   Osteomyelitis ([place]) 12/16/2020   Closed left ankle fracture 12/16/2020   OSA on CPAP 12/16/2020   Morbid obesity with BMI of 50.0-59.9, adult ([place]) 12/16/2020   Hyperlipidemia, mixed 12/16/2020   Depression 12/16/2020   Hypothyroid 12/16/2020   Chronic diastolic CHF (congestive heart failure) ([place]) 12/16/2020      Prior to Admission medications   Medication Sig Start Date End Date Taking? Authorizing Provider  aspirin 81 MG chewable tablet Chew 81 mg by mouth daily.    [provider]  atorvastatin (LIPITOR) 20 MG tablet Take 20 mg by mouth daily. 10/06/20   [provider]  buPROPion (WELLBUTRIN SR) 150 MG 12 hr tablet Take 150 mg by mouth 2 (two) times  daily. 12/02/20   [provider]  Cholecalciferol 25 MCG (1000 UT) capsule Take 1,000 Units by mouth daily.    [provider]  Cyanocobalamin 1000 MCG LOZG Place 1 lozenge under the tongue 2 (two) times a week.    [provider]  furosemide (LASIX) 40 MG tablet Hold until followup with your outpatient doctor due to your kidney function worsening. 12/22/20   Enzo Bi, MD  gabapentin (NEURONTIN) 300 MG capsule Take 300 mg by mouth 3 (three) times daily. '300mg'$  three times after dinner spread out between hours of dinner and bed 04/07/20   [provider]  ibuprofen (ADVIL) 200 MG tablet Take 400 mg by mouth every 6 (six) hours as needed for mild pain or moderate pain.    [provider]  levalbuterol Penne Lash HFA) 45 MCG/ACT inhaler Inhale 2 puffs into the lungs every 4 (four) hours as needed for wheezing.    [provider]  levothyroxine (SYNTHROID) 88 MCG tablet Take 88 mcg by mouth daily. 30 to 60 minutes before breakfast on an empty stomach and with a glass of water 12/02/20   [provider]  metolazone (ZAROXOLYN) 2.5 MG tablet Hold until followup with your outpatient doctor due to your kidney function worsening. 12/22/20   Enzo Bi, MD  Multiple Vitamins-Minerals (PRESERVISION AREDS 2) CAPS Take 1 capsule by mouth 2 (two) times daily.    [provider]  potassium chloride (KLOR-CON) 10 MEQ tablet Take 10 mEq by mouth 2 (  two) times daily. 10/21/20   [provider]  trolamine salicylate (ASPERCREME) 10 % cream Apply 1 application topically as needed for muscle pain. Apply to knees and feet    [provider]    Allergies Albuterol, Grass extracts [gramineae pollens], and Other    Social History Social History   Tobacco Use   Smoking status: Never   Smokeless tobacco: Never    Review of Systems Patient denies headaches, rhinorrhea, blurry vision, numbness, shortness of breath, chest pain, edema,  cough, abdominal pain, nausea, vomiting, diarrhea, dysuria, fevers, rashes or hallucinations unless otherwise stated above in HPI. ____________________________________________   PHYSICAL EXAM:  VITAL SIGNS: Vitals:   03/17/21 1310  BP: 106/65  Pulse: 77  Resp: 18  Temp: 97.8 F (36.6 C)  SpO2: 96%    Constitutional: Alert and oriented.  Eyes: Conjunctivae are normal.  Head: Atraumatic. Nose: No congestion/rhinnorhea. Mouth/Throat: Mucous membranes are moist.   Neck: No stridor. Painless ROM.  Cardiovascular: Normal rate, regular rhythm. Grossly normal heart sounds.  Good peripheral circulation. Respiratory: Normal respiratory effort.  No retractions. Lungs CTAB. Gastrointestinal: Soft and nontender. No distention. No abdominal bruits. No CVA tenderness. Genitourinary:  Musculoskeletal: No lower extremity edema venous stasis ulceration with very foul-smelling malodorous discharge and purulence coming from left fourth and third digit third digit is cool to touch and weeping.  PT pulses palpable.  No joint effusions. Neurologic:  Normal speech and language. No gross focal neurologic deficits are appreciated. No facial droop Skin:  Skin is warm, dry and intact. No rash noted. Psychiatric: Mood and affect are normal. Speech and behavior are normal.  ____________________________________________   LABS (all labs ordered are listed, but only abnormal results are displayed)  Results for orders placed or performed during the hospital encounter of 03/17/21 (from the past 24 hour(s))  Comprehensive metabolic panel     Status: Abnormal   Collection Time: 03/17/21  1:21 PM  Result Value Ref Range   Sodium 143 135 - 145 mmol/L   Potassium 4.9 3.5 - 5.1 mmol/L   Chloride 95 (L) 98 - 111 mmol/L   CO2 40 (H) 22 - 32 mmol/L   Glucose, Bld 114 (H) 70 - 99 mg/dL   BUN 31 (H) 8 - 23 mg/dL   Creatinine, Ser 1.50 (H) 0.61 - 1.24 mg/dL   Calcium 9.4 8.9 - 10.3 mg/dL   Total Protein 7.5 6.5 -  8.1 g/dL   Albumin 3.9 3.5 - 5.0 g/dL   AST 11 (L) 15 - 41 U/L   ALT 7 0 - 44 U/L   Alkaline Phosphatase 54 38 - 126 U/L   Total Bilirubin 1.1 0.3 - 1.2 mg/dL   GFR, Estimated 50 (L) >60 mL/min   Anion gap 8 5 - 15  Lactic acid, plasma     Status: None   Collection Time: 03/17/21  1:21 PM  Result Value Ref Range   Lactic Acid, Venous 0.7 0.5 - 1.9 mmol/L  CBC with Differential     Status: Abnormal   Collection Time: 03/17/21  1:21 PM  Result Value Ref Range   WBC 7.6 4.0 - 10.5 K/uL   RBC 4.90 4.22 - 5.81 MIL/uL   Hemoglobin 14.1 13.0 - 17.0 g/dL   HCT 47.8 39.0 - 52.0 %   MCV 97.6 80.0 - 100.0 fL   MCH 28.8 26.0 - 34.0 pg   MCHC 29.5 (L) 30.0 - 36.0 g/dL   RDW 15.5 11.5 - 15.5 %   Platelets  232 150 - 400 K/uL   nRBC 0.0 0.0 - 0.2 %   Neutrophils Relative % 71 %   Neutro Abs 5.5 1.7 - 7.7 K/uL   Lymphocytes Relative 11 %   Lymphs Abs 0.8 0.7 - 4.0 K/uL   Monocytes Relative 9 %   Monocytes Absolute 0.7 0.1 - 1.0 K/uL   Eosinophils Relative 7 %   Eosinophils Absolute 0.5 0.0 - 0.5 K/uL   Basophils Relative 1 %   Basophils Absolute 0.1 0.0 - 0.1 K/uL   Immature Granulocytes 1 %   Abs Immature Granulocytes 0.07 0.00 - 0.07 K/uL  Protime-INR     Status: None   Collection Time: 03/17/21  1:21 PM  Result Value Ref Range   Prothrombin Time 13.9 11.4 - 15.2 seconds   INR 1.1 0.8 - 1.2   ____________________________________________ ____________________________________________  RADIOLOGY  I personally reviewed all radiographic images ordered to evaluate for the above acute complaints and reviewed radiology reports and findings.  These findings were personally discussed with the patient.  Please see medical record for radiology report.  ____________________________________________   PROCEDURES  Procedure(s) performed:  Procedures    Critical Care performed: no ____________________________________________   INITIAL IMPRESSION / ASSESSMENT AND PLAN / ED  COURSE  Pertinent labs & imaging results that were available during my care of the patient were reviewed by me and considered in my medical decision making (see chart for details).   DDX: Gangrene, sepsis, ischemic leg, diabetic cellulitis, abscess  Christopher [name] is a 70 y.o. who presents to the ED with presentation as described above.  Patient with chronic comorbidities and very foul appearing infected gangrenous appearing left foot.  He is not meeting septic criteria but certainly concerning for deep space infection IV antibiotics ordered.  Will discuss with hospitalist for admission and podiatry evaluation.     The patient was evaluated in Emergency Department today for the symptoms described in the history of present illness. He/she was evaluated in the context of the global COVID-19 pandemic, which necessitated consideration that the patient might be at risk for infection with the SARS-CoV-2 virus that causes COVID-19. Institutional protocols and algorithms that pertain to the evaluation of patients at risk for COVID-19 are in a state of rapid change based on information released by regulatory bodies including the CDC and federal and state organizations. These policies and algorithms were followed during the patient's care in the ED.  As part of my medical decision making, I reviewed the following data within the [place] notes reviewed and incorporated, Labs reviewed, notes from prior ED visits and [place] Controlled Substance Database   ____________________________________________   FINAL CLINICAL IMPRESSION(S) / ED DIAGNOSES  Final diagnoses:  Gangrene of left foot ([place])      NEW MEDICATIONS STARTED DURING THIS VISIT:  New Prescriptions   No medications on file     Note:  This document was prepared using Dragon voice recognition software and may include unintentional dictation errors.    Merlyn Lot, MD 03/17/21 [phone]

## 2021-03-17 NOTE — ED Notes (Signed)
Critical Care  [facility]  PH 7.32 Co2 89 Bicarb 45.9  MD. Francine Graven

## 2021-03-17 NOTE — ED Notes (Addendum)
Daughter at bedside ,  Per Daughter pt broke ankle aprox end of March of this year accidental fall, foot infection was discovered today by home health nurse today . Ace wrap and gauze have not been changed for aprox a month . June 23rd was last evaluation by foot doctor.

## 2021-03-17 NOTE — ED Notes (Signed)
Admitting MD at bedside.

## 2021-03-17 NOTE — ED Notes (Signed)
ED Provider at bedside. 

## 2021-03-17 NOTE — ED Notes (Signed)
Pt back from x-ray.

## 2021-03-17 NOTE — Consult Note (Addendum)
ORTHOPAEDIC CONSULTATION  REQUESTING PHYSICIAN: Collier Bullock, MD  Chief Complaint: Possible left toe gangrene  HPI: Christopher [name] is a 70 y.o. male who complains of possible infection to his left toe.  He was seen earlier today by his primary care provider Dr. Sabra Heck.  He has been in a [employer] for the last month now.  He was last seen in my office just over a month ago.  At that time we discussed weightbearing as tolerated in the boot.  We discussed removal of the boot he sleeps.  The daughter states he has kept the boot on and has not remove the boot at all since his last visit with [place] in the clinic.  Apparently developed a wound to this left third toe and there is concern for gangrenous changes to the area.  No past medical history on file. Past Surgical History:  Procedure Laterality Date   ORIF ANKLE FRACTURE Left 12/17/2020   Procedure: OPEN REDUCTION INTERNAL FIXATION (ORIF) ANKLE FRACTURE;  Surgeon: Samara Deist, DPM;  Location: [facility] ORS;  Service: Podiatry;  Laterality: Left;   Social History   Socioeconomic History   Marital status: Married    Spouse name: Not on file   Number of children: Not on file   Years of education: Not on file   Highest education level: Not on file  Occupational History   Not on file  Tobacco Use   Smoking status: Never   Smokeless tobacco: Never  Substance and Sexual Activity   Alcohol use: Not on file   Drug use: Not on file   Sexual activity: Not on file  Other Topics Concern   Not on file  Social History Narrative   Not on file   Social Determinants of Health   Financial Resource Strain: Not on file  Food Insecurity: Not on file  Transportation Needs: Not on file  Physical Activity: Not on file  Stress: Not on file  Social Connections: Not on file   No family history on file. Allergies  Allergen Reactions   Albuterol Other (See Comments)    Causes afib    Grass Extracts [Gramineae Pollens] Other (See  Comments)    Sneeze and watery eyes   Other     Other reaction(s): Other (See Comments) Unable to breath when around cats   Prior to Admission medications   Medication Sig Start Date End Date Taking? Authorizing Provider  aspirin 81 MG chewable tablet Chew 81 mg by mouth daily.    [provider]  atorvastatin (LIPITOR) 20 MG tablet Take 20 mg by mouth daily. 10/06/20   [provider]  buPROPion (WELLBUTRIN SR) 150 MG 12 hr tablet Take 150 mg by mouth 2 (two) times daily. 12/02/20   [provider]  Cholecalciferol 25 MCG (1000 UT) capsule Take 1,000 Units by mouth daily.    [provider]  Cyanocobalamin 1000 MCG LOZG Place 1 lozenge under the tongue 2 (two) times a week.    [provider]  furosemide (LASIX) 40 MG tablet Hold until followup with your outpatient doctor due to your kidney function worsening. 12/22/20   Enzo Bi, MD  gabapentin (NEURONTIN) 300 MG capsule Take 300 mg by mouth 3 (three) times daily. '300mg'$  three times after dinner spread out between hours of dinner and bed 04/07/20   [provider]  ibuprofen (ADVIL) 200 MG tablet Take 400 mg by mouth every 6 (six) hours as needed for mild pain or moderate pain.  [provider]  levalbuterol Penne Lash HFA) 45 MCG/ACT inhaler Inhale 2 puffs into the lungs every 4 (four) hours as needed for wheezing.    [provider]  levothyroxine (SYNTHROID) 88 MCG tablet Take 88 mcg by mouth daily. 30 to 60 minutes before breakfast on an empty stomach and with a glass of water 12/02/20   [provider]  metolazone (ZAROXOLYN) 2.5 MG tablet Hold until followup with your outpatient doctor due to your kidney function worsening. 12/22/20   Enzo Bi, MD  Multiple Vitamins-Minerals (PRESERVISION AREDS 2) CAPS Take 1 capsule by mouth 2 (two) times daily.    [provider]  potassium chloride (KLOR-CON) 10 MEQ tablet Take 10 mEq by mouth 2 (two) times daily. 10/21/20    [provider]  trolamine salicylate (ASPERCREME) 10 % cream Apply 1 application topically as needed for muscle pain. Apply to knees and feet    [provider]   DG Chest 2 View  Result Date: 03/17/2021 CLINICAL DATA:  Suspected sepsis. EXAM: CHEST - 2 VIEW COMPARISON:  12/16/2020 FINDINGS: Two views of the chest demonstrate mild enlargement of the cardiac silhouette which is similar to the previous examination. Central vascular structures are prominent. No large pleural effusions. Degenerative changes in the thoracic spine. Negative for a pneumothorax. IMPRESSION: Vascular congestion or mild interstitial pulmonary edema. Electronically Signed   By: Markus Daft M.D.   On: 03/17/2021 15:01   DG Foot Complete Left  Result Date: 03/17/2021 CLINICAL DATA:  Suspected sepsis.  Foot infection. EXAM: LEFT FOOT - COMPLETE 3+ VIEW COMPARISON:  No prior. FINDINGS: Bandage is noted overlying the foot making evaluation difficult. Diffuse soft tissue swelling. Postsurgical changes left ankle. Hardware intact. Diffuse osteopenia and degenerative change. Questionable subtle erosion at the base of the left fifth metatarsal. No other focal bony abnormalities identified. No evidence of fracture or dislocation. IMPRESSION: 1. Bandage is noted overlying the foot making evaluation difficult. Diffuse soft tissue swelling. Postsurgical changes left ankle. Diffuse osteopenia degenerative change. 2. Questionable subtle erosion at the base of the left fifth metatarsal. Osteomyelitis cannot be completely excluded. Further evaluation with MRI can be obtained as needed. Electronically Signed   By: Marcello Moores  Register   On: 03/17/2021 15:03      Positive ROS: All other systems have been reviewed and were otherwise negative with the exception of those mentioned in the HPI and as above.  12 point ROS was performed.  Physical Exam: General: Alert and oriented.  No apparent distress.  Vascular:  Left foot:Dorsalis  Pedis:  present Posterior Tibial:  present  Right foot:  Not evaluated today  Neuro:absent tactile sensation but gross sensation is intact  Derm: There is a little bit of superficial irritation of the left third toe.  Scant amount of drainage likely secondary to superficial blistering.  I suspect this is secondary to edema to the area.  He does have some maceration in the webspaces but no signs of a deep space abscess at this point.  Of note he does have a small superficial ulcer on the medial aspect of the ankle.  This is at the surgical site.  I suspect this is more of a venous ulceration to this region at this time.     Ortho/MS: Diffuse lower extremity lymphedema is noted to the left lower leg at this time.  Assessment: Superficial erythema left third toe with clear drainage History of Lisfranc fracture left foot History of ankle fracture status post ORIF  Plan: The  left third toe does not appear to have a deep space infection.  There is just minimal cellulitis with superficial blistering and scant clear drainage to the area.  This does not have the appearance of gangrenous changes.  We can continue to follow while patient is hospitalized.  The area of concern on x-ray is remote from the site of the cellulitic toe.  I would recommend just decreasing the edema to the leg with elevation.  Betadine can be applied to the left second and third webspace to decrease the maceration in this area.  Of note patient does have a superficial medial malleolus wound.  He will need dressings for this.  Would recommend consulting wound care team for this.  He does have 2 skin staples on his medial ankle.  We will try to have these removed while he is in-house.  He has palpable pulses.  I do not suspect that he has a vascular compromise to this leg.  Both his DP and PT pulses are fully palpable.    Elesa Hacker, DPM Cell [phone]   03/17/2021 3:54 PM

## 2021-03-17 NOTE — ED Triage Notes (Signed)
Pt here from [facility] with foot pain. Pt left foot has pus coming from the foot with a foul smell. Pt denies N/V/D. Pt stable in triage.

## 2021-03-17 NOTE — ED Notes (Signed)
Pt placed on bipap  

## 2021-03-17 NOTE — ED Notes (Signed)
1 set of blood cultures sent to lab.  

## 2021-03-17 NOTE — Consult Note (Signed)
PHARMACY -  BRIEF ANTIBIOTIC NOTE   Pharmacy has received consult(s) for Vancomycin & cefepime from an ED provider.  The patient's profile has been reviewed for ht/wt/allergies/indication/available labs.    One time order(s) placed for: Vancomycin 2g x1 Cefepime 2g IV x1 Also on Metronidazole '500mg'$  IV x1  Further antibiotics/pharmacy consults should be ordered by admitting physician if indicated.                       Thank you, Lorna Dibble 03/17/2021  2:56 PM

## 2021-03-17 NOTE — ED Notes (Signed)
Patient transported to X-ray 

## 2021-03-17 NOTE — ED Notes (Signed)
Sent pharmacy message to verify lasix

## 2021-03-17 NOTE — H&P (Addendum)
History and Physical    Christopher [name] TDD:[id number] DOB: [birth year] DOA: 03/17/2021  PCP: Christopher Aus, MD   Patient coming from: Home  I have personally briefly reviewed patient's old medical records in [place]  Chief Complaint: Left foot pain  HPI: Christopher [name] is a 70 y.o. male with medical history significant for morbid obesity, obstructive sleep apnea on CPAP, hypothyroidism, depression, chronic diastolic dysfunction CHF, status post ORIF for closed left ankle fracture who referred to the emergency room by his primary care provider for evaluation of left foot pain and increased drainage from left foot wound. Patient had an ankle fracture in April and had surgery to his left ankle.  According to the daughter he has had limited mobility since after his surgery.  He was initially able to ambulate with a rolling walker but now the daughter states that it is very difficult for him to do that.  Patient had an Ace bandage placed on his left foot over 4 weeks ago and according to the daughter the bandage had not been changed since it was first placed.  They have noted increased drainage through the Ace bandage.  She also states that he has become increasingly confused over the last couple of weeks and has visual hallucinations.  He is also noted to speak to people who are not present. He denies having any chest pain, no shortness of breath, no fever, no chills, no abdominal pain, no headache, no nausea, no vomiting, no urinary symptoms. He is oriented only to person and place but not to time and the daughter states this is nothing usual for him because he does not go anywhere and so does not keep track of days of the week or months of the year. Labs show sodium 143, potassium 4.9, chloride 95, bicarb 40, glucose 114, BUN 31, creatinine 1.5, calcium 9.4, alkaline phosphatase 54, albumin 3.9, AST 11, ALT 7, total protein 7.5, lactic acid 0.7, white count 7.6, hemoglobin 14.1, hematocrit  37.8, MCV 97.6, RDW 15.5, platelet count 232, PT 13.9, INR 1.1 Respiratory viral panel is negative Chest x-ray reviewed by me shows mild cardiomegaly with vascular congestion. X-ray of the left foot shows diffuse soft tissue swelling.  Postsurgical changes involving the left ankle.  Diffuse osteopenia degenerative change.  Questionable subtle erosion at the base of the left fifth metatarsal. Osteomyelitis cannot be completely excluded.    ED Course: Patient is a 70 year old male who was brought into the ER by his daughter for evaluation of pain and increased foul-smelling drainage from his left foot. Patient is noted to have a macerated third left digit with foul-smelling drainage. He received broad-spectrum IV antibiotics in the ER and will be admitted to the hospital for further evaluation.     Review of Systems: As per HPI otherwise all other systems reviewed and negative.    Past Medical History:  Diagnosis Date   CHF (congestive heart failure) ([place])    Hypothyroidism    Sleep apnea     Past Surgical History:  Procedure Laterality Date   FRACTURE SURGERY     ORIF ANKLE FRACTURE Left 12/17/2020   Procedure: OPEN REDUCTION INTERNAL FIXATION (ORIF) ANKLE FRACTURE;  Surgeon: Samara Deist, DPM;  Location: [facility] ORS;  Service: Podiatry;  Laterality: Left;     reports that he has never smoked. He has never used smokeless tobacco. No history on file for alcohol use and drug use.  Allergies  Allergen Reactions   Albuterol Other (See Comments)  Causes afib    Grass Extracts [Gramineae Pollens] Other (See Comments)    Sneeze and watery eyes   Other     Other reaction(s): Other (See Comments) Unable to breath when around cats    Family History  Problem Relation Age of Onset   Hypertension Mother       Prior to Admission medications   Medication Sig Start Date End Date Taking? Authorizing Provider  aspirin 81 MG chewable tablet Chew 81 mg by mouth daily.    [provider]  atorvastatin (LIPITOR) 20 MG tablet Take 20 mg by mouth daily. 10/06/20   [provider]  buPROPion (WELLBUTRIN SR) 150 MG 12 hr tablet Take 150 mg by mouth 2 (two) times daily. 12/02/20   [provider]  Cholecalciferol 25 MCG (1000 UT) capsule Take 1,000 Units by mouth daily.    [provider]  Cyanocobalamin 1000 MCG LOZG Place 1 lozenge under the tongue 2 (two) times a week.    [provider]  furosemide (LASIX) 40 MG tablet Hold until followup with your outpatient doctor due to your kidney function worsening. 12/22/20   Enzo Bi, MD  gabapentin (NEURONTIN) 300 MG capsule Take 300 mg by mouth 3 (three) times daily. 336m three times after dinner spread out between hours of dinner and bed 04/07/20   [provider]  ibuprofen (ADVIL) 200 MG tablet Take 400 mg by mouth every 6 (six) hours as needed for mild pain or moderate pain.    [provider]  levalbuterol (Penne LashHFA) 45 MCG/ACT inhaler Inhale 2 puffs into the lungs every 4 (four) hours as needed for wheezing.    [provider]  levothyroxine (SYNTHROID) 88 MCG tablet Take 88 mcg by mouth daily. 30 to 60 minutes before breakfast on an empty stomach and with a glass of water 12/02/20   [provider]  metolazone (ZAROXOLYN) 2.5 MG tablet Hold until followup with your outpatient doctor due to your kidney function worsening. 12/22/20   LEnzo Bi MD  Multiple Vitamins-Minerals (PRESERVISION AREDS 2) CAPS Take 1 capsule by mouth 2 (two) times daily.    [provider]  potassium chloride (KLOR-CON) 10 MEQ tablet Take 10 mEq by mouth 2 (two) times daily. 10/21/20   [provider]  trolamine salicylate (ASPERCREME) 10 % cream Apply 1 application topically as needed for muscle pain. Apply to knees and feet    [provider]    Physical Exam: Vitals:   03/17/21 1310 03/17/21 1311  BP: 106/65   Pulse: 77   Resp: 18   Temp: 97.8 F  (36.6 C)   TempSrc: Oral   SpO2: 96%   Weight:  (!) 149.7 kg  Height:  _0  (1.803 m)     Vitals:   03/17/21 1310 03/17/21 1311  BP: 106/65   Pulse: 77   Resp: 18   Temp: 97.8 F (36.6 C)   TempSrc: Oral   SpO2: 96%   Weight:  (!) 149.7 kg  Height:  _1  (1.803 m)      Constitutional: Sleeping but arouses easily.  Oriented to person and place but not to time. Not in any apparent distress.  Morbid Obesity  HEENT:      Head: Normocephalic and atraumatic.         Eyes: PERLA, EOMI, Conjunctivae are normal. Sclera is non-icteric.       Mouth/Throat: Mucous membranes are moist.       Neck: Supple with  no signs of meningismus. Cardiovascular: Regular rate and rhythm. No murmurs, gallops, or rubs. 2+ symmetrical distal pulses are present . No JVD. No LE edema Respiratory: Respiratory effort normal .Lungs sounds clear bilaterally. No wheezes, crackles, or rhonchi.  Gastrointestinal: Soft, non tender, and non distended with positive bowel sounds.  Genitourinary: No CVA tenderness. Musculoskeletal: Non tender. decreased range of motion left ankle.  Hyperpigmentation of the left lower extremity with gangrene involving the left third toe. Neurologic:  Face is symmetric. Moving all extremities. No gross focal neurologic deficits . Skin: Skin is warm, dry.  No rash or ulcers Psychiatric: Mood and affect are normal thank you Jesus at 4:00 I will have to hear from the [facility] again thank you load   Labs on Admission: I have personally reviewed following labs and imaging studies  CBC: Recent Labs  Lab 03/17/21 1321  WBC 7.6  NEUTROABS 5.5  HGB 14.1  HCT 47.8  MCV 97.6  PLT 573   Basic Metabolic Panel: Recent Labs  Lab 03/17/21 1321  NA 143  K 4.9  CL 95*  CO2 40*  GLUCOSE 114*  BUN 31*  CREATININE 1.50*  CALCIUM 9.4   GFR: Estimated Creatinine Clearance: 68.1 mL/min (A) (by C-G formula based on SCr of 1.5 mg/dL (H)). Liver Function Tests: Recent Labs  Lab  03/17/21 1321  AST 11*  ALT 7  ALKPHOS 54  BILITOT 1.1  PROT 7.5  ALBUMIN 3.9   No results for input(s): LIPASE, AMYLASE in the last 168 hours. No results for input(s): AMMONIA in the last 168 hours. Coagulation Profile: Recent Labs  Lab 03/17/21 1321  INR 1.1   Cardiac Enzymes: No results for input(s): CKTOTAL, CKMB, CKMBINDEX, TROPONINI in the last 168 hours. BNP (last 3 results) No results for input(s): PROBNP in the last 8760 hours. HbA1C: No results for input(s): HGBA1C in the last 72 hours. CBG: No results for input(s): GLUCAP in the last 168 hours. Lipid Profile: No results for input(s): CHOL, HDL, LDLCALC, TRIG, CHOLHDL, LDLDIRECT in the last 72 hours. Thyroid Function Tests: No results for input(s): TSH, T4TOTAL, FREET4, T3FREE, THYROIDAB in the last 72 hours. Anemia Panel: No results for input(s): VITAMINB12, FOLATE, FERRITIN, TIBC, IRON, RETICCTPCT in the last 72 hours. Urine analysis: No results found for: COLORURINE, APPEARANCEUR, LABSPEC, [place], GLUCOSEU, HGBUR, BILIRUBINUR, KETONESUR, PROTEINUR, UROBILINOGEN, NITRITE, LEUKOCYTESUR  Radiological Exams on Admission: DG Chest 2 View  Result Date: 03/17/2021 CLINICAL DATA:  Suspected sepsis. EXAM: CHEST - 2 VIEW COMPARISON:  12/16/2020 FINDINGS: Two views of the chest demonstrate mild enlargement of the cardiac silhouette which is similar to the previous examination. Central vascular structures are prominent. No large pleural effusions. Degenerative changes in the thoracic spine. Negative for a pneumothorax. IMPRESSION: Vascular congestion or mild interstitial pulmonary edema. Electronically Signed   By: Markus Daft M.D.   On: 03/17/2021 15:01   DG Foot Complete Left  Result Date: 03/17/2021 CLINICAL DATA:  Suspected sepsis.  Foot infection. EXAM: LEFT FOOT - COMPLETE 3+ VIEW COMPARISON:  No prior. FINDINGS: Bandage is noted overlying the foot making evaluation difficult. Diffuse soft tissue swelling. Postsurgical  changes left ankle. Hardware intact. Diffuse osteopenia and degenerative change. Questionable subtle erosion at the base of the left fifth metatarsal. No other focal bony abnormalities identified. No evidence of fracture or dislocation. IMPRESSION: 1. Bandage is noted overlying the foot making evaluation difficult. Diffuse soft tissue swelling. Postsurgical changes left ankle. Diffuse osteopenia degenerative change. 2. Questionable subtle erosion at the base of  the left fifth metatarsal. Osteomyelitis cannot be completely excluded. Further evaluation with MRI can be obtained as needed. Electronically Signed   By: Marcello Moores  Register   On: 03/17/2021 15:03     Assessment/Plan Principal Problem:   Osteomyelitis of ankle or foot, acute, left ([place]) Active Problems:   OSA on CPAP   Morbid obesity with BMI of 50.0-59.9, adult (HCC)   Depression   Hypothyroid   Chronic diastolic CHF (congestive heart failure) (HCC)       Osteomyelitis of left foot Obtain ESR, CRP and prealbumin levels Will place patient empirically on Rocephin and Flagyl Consult podiatry Follow-up results of blood and wound cultures.    Chronic diastolic dysfunction CHF Stable and not acutely exacerbated Continue furosemide and metolazone    Anxiety and depression Continue bupropion    Morbid obesity (BMI 46) With complications of obstructive sleep apnea Continue CPAP at bedtime    Hypothyroidism Continue Synthroid     Neuropathy Continue gabapentin     Acute on chronic hypercapnic respiratory failure Patient wears 2 L of oxygen continuous at home His daughter states that he has become increasingly confused, lethargic and has visual hallucinations Arterial blood gases consistent with uncompensated respiratory acidosis Will place patient on BiPAP Repeat ABG in 2 hours.    DVT prophylaxis: SCD Code Status: full code  Family Communication: Greater than 50% of time was spent discussing patient's  condition and plan of care with him and his daughter at the bedside.  All questions and concerns have been addressed.  They verbalized understanding and agree with the plan. Disposition Plan: Back to previous home environment Consults called: Podiatry Status: At the time of admission, it appears that the appropriate admission status for this patient is inpatient. This is judged to be reasonable and necessary in order to provide the required intensity of service to ensure the patient's safety given the presenting symptoms, physical exam findings and initial radiographic and laboratory data in the context of their comorbid conditions. Patient requires inpatient status due to high intensity of service, high risk for further deterioration and high frequency of surveillance required.    Collier Bullock MD [facility]     03/17/2021, 3:57 PM

## 2021-03-18 ENCOUNTER — Encounter
Admission: EM | Disposition: A | Discharge status: Skilled Nursing Facility | Payer: Self-pay | Source: Home / Self Care | Attending: Internal Medicine

## 2021-03-18 ENCOUNTER — Encounter: Payer: Self-pay | Admitting: Internal Medicine

## 2021-03-18 ENCOUNTER — Other Ambulatory Visit (INDEPENDENT_AMBULATORY_CARE_PROVIDER_SITE_OTHER): Payer: Self-pay | Admitting: Vascular Surgery

## 2021-03-18 DIAGNOSIS — I96 Gangrene, not elsewhere classified: Secondary | ICD-10-CM | POA: Diagnosis not present

## 2021-03-18 DIAGNOSIS — M86172 Other acute osteomyelitis, left ankle and foot: Secondary | ICD-10-CM | POA: Diagnosis not present

## 2021-03-18 LAB — CBC
HCT: 40.5 % (ref 39.0–52.0)
Hemoglobin: 12.1 g/dL — ABNORMAL LOW (ref 13.0–17.0)
MCH: 28.9 pg (ref 26.0–34.0)
MCHC: 29.9 g/dL — ABNORMAL LOW (ref 30.0–36.0)
MCV: 96.9 fL (ref 80.0–100.0)
Platelets: 186 10*3/uL (ref 150–400)
RBC: 4.18 MIL/uL — ABNORMAL LOW (ref 4.22–5.81)
RDW: 15.4 % (ref 11.5–15.5)
WBC: 6.5 10*3/uL (ref 4.0–10.5)
nRBC: 0 % (ref 0.0–0.2)

## 2021-03-18 LAB — BASIC METABOLIC PANEL
Anion gap: 10 (ref 5–15)
BUN: 29 mg/dL — ABNORMAL HIGH (ref 8–23)
CO2: 37 mmol/L — ABNORMAL HIGH (ref 22–32)
Calcium: 8.8 mg/dL — ABNORMAL LOW (ref 8.9–10.3)
Chloride: 96 mmol/L — ABNORMAL LOW (ref 98–111)
Creatinine, Ser: 1.19 mg/dL (ref 0.61–1.24)
GFR, Estimated: >60 mL/min (ref >60)
Glucose, Bld: 87 mg/dL (ref 70–99)
Potassium: 4.5 mmol/L (ref 3.5–5.1)
Sodium: 143 mmol/L (ref 135–145)

## 2021-03-18 LAB — HEMOGLOBIN A1C
Hgb A1c MFr Bld: 5 % (ref 4.8–5.6)
Mean Plasma Glucose: 97 mg/dL

## 2021-03-18 SURGERY — LOWER EXTREMITY ANGIOGRAPHY
Anesthesia: Moderate Sedation | Laterality: Left

## 2021-03-18 MED ORDER — ENSURE MAX PROTEIN PO LIQD
11.0000 [oz_av] | Freq: Two times a day (BID) | ORAL | Status: DC
  Administered 2021-03-18 – 2021-03-20 (×4): 11 [oz_av] via ORAL
  Filled 2021-03-18: qty 330

## 2021-03-18 MED ORDER — ENOXAPARIN SODIUM 80 MG/0.8ML IJ SOSY
0.5000 mg/kg | PREFILLED_SYRINGE | INTRAMUSCULAR | Status: DC
  Administered 2021-03-18 – 2021-03-19 (×2): 75 mg via SUBCUTANEOUS
  Filled 2021-03-18 (×2): qty 0.8

## 2021-03-18 MED ORDER — OCUVITE-LUTEIN PO CAPS
1.0000 | ORAL_CAPSULE | Freq: Every day | ORAL | Status: DC
  Administered 2021-03-19 – 2021-03-20 (×2): 1 via ORAL
  Filled 2021-03-18 (×2): qty 1

## 2021-03-18 MED ORDER — MUPIROCIN 2 % EX OINT
TOPICAL_OINTMENT | Freq: Every day | CUTANEOUS | Status: DC
  Filled 2021-03-18: qty 22

## 2021-03-18 MED ORDER — ASCORBIC ACID 500 MG PO TABS
250.0000 mg | ORAL_TABLET | Freq: Two times a day (BID) | ORAL | Status: DC
  Administered 2021-03-18 – 2021-03-20 (×4): 250 mg via ORAL
  Filled 2021-03-18 (×4): qty 1

## 2021-03-18 NOTE — Evaluation (Signed)
Physical Therapy Evaluation Patient Details Name: Christopher [name] MRN: [id number] DOB: [birth year] Today's Date: 03/18/2021   History of Present Illness  Christopher [name] is a 58yoM who comes to [facility] with Left foot pain. Per DTR pt has had decline in mobility since ankle ORIF April 2022, recently more confused and with visual hallucination. PMH: morbid obesity, OSA on CPAP, hypoTSH, depression, dCHF, Left ankle fracture s/p ORIF. Pt seen by podiatry, feels pt has new wounds related to venous insufficiceny without evidence of surgical infection.  Podiatry recommending actvity as tolerated with post op boot when up mobilizing.  Pt hasbeen working with HHPT for >4 weeks, has not been able to return to any appreciable walking aside from SPT WC to/from recliner. Pt has sustained several falls at home, some involving legs buckling.  Clinical Impression  Pt admitted with above diagnosis. Pt currently with functional limitations due to the deficits listed below (see "PT Problem List"). Upon entry, pt in bed, awake and agreeable to participate. The pt is alert, pleasant, interactive, and able to provide basic info regarding prior level of function, both in tolerance and independence. Pt unable to give detailed medical history, taken from DTR after entry. MinA to EOB, HOB elevated, Pt able to rise to standing briefly from elevated surface, but unable to lift either foot from floor for lateral stepping. +2Total A to return patient to center of bed supine. Pt on 4.5L O2 at entry, weened down to 2L while at EOB, satting at 89%, left on 3L at exit. Pt has been largely nonambulatory since his ankle fracture in April. Sounds as though his falls at home have been more frequent, albeit the total number of falls makes statistical significance of this questionable, may simply be more related to recently advanced weight bear status and more ambulatory transfer. Patient's performance this date reveals impaired ability, independence,  and tolerance in performing all basic mobility required for performance of activities of daily living, however he current requires his most recent baseline level of assistance. Pt was ambulatory several months ago prior to ankle fracture, however comorbidities make it difficult to determine his prognosis for return to ambulatory status, particular with his recent leg buckling issues and LEE. Pt requires additional DME, close physical assistance, and cues for safe participate in mobility. Pt will benefit from skilled PT intervention to increase independence and safety with basic mobility in preparation for discharge to the venue listed below.     Orthostatic VS for the past 24 hrs (Last 3 readings):  BP- Lying Pulse- Lying BP- Sitting Pulse- Sitting  03/18/21 1651 110/57 75 129/75 82       Follow Up Recommendations Home health PT;Supervision for mobility/OOB;Supervision - Intermittent    Equipment Recommendations  None recommended by PT    Recommendations for Other Services       Precautions / Restrictions Precautions Precautions: Fall Restrictions LLE Weight Bearing: Weight bearing as tolerated Other Position/Activity Restrictions: WBAT in cam boot when up      Mobility  Bed Mobility Overal bed mobility: Needs Assistance Bed Mobility: Supine to Sit;Sit to Supine     Supine to sit: Min assist Sit to supine: Mod assist;Total assist;+2 for physical assistance   General bed mobility comments: ModA to supine; total 2+A for cranial scooting    Transfers Overall transfer level: Needs assistance Equipment used: Rolling walker (2 wheeled) Transfers: Sit to/from Stand;Lateral/Scoot Transfers Sit to Stand: From elevated surface;Min guard        Lateral/Scoot Transfers: Mod assist;+2 physical  assistance General transfer comment: boot donned  Ambulation/Gait Ambulation/Gait assistance:  (unable to lift either foot from ground.)              Stairs             Wheelchair Mobility    Modified Rankin (Stroke Patients Only)       Balance Overall balance assessment: Modified Independent;History of Falls;No apparent balance deficits (not formally assessed)                                           Pertinent Vitals/Pain Pain Assessment: No/denies pain    Home Living Family/patient expects to be discharged to:: Private residence Living Arrangements: Children;Spouse/significant other (DTR provides some basic help for patient and his wife)   Type of Home: House Home Access: Stairs to enter Entrance Stairs-Rails: Psychiatric nurse of Steps: 2 Home Layout: Able to live on main level with bedroom/bathroom Home Equipment: Walker - 2 wheels;Walker - 4 wheels;Wheelchair - [employer];Shower seat - built in      Prior Pension scheme manager        Extremity/Trunk Assessment   Upper Extremity Assessment Upper Extremity Assessment: Generalized weakness    Lower Extremity Assessment Lower Extremity Assessment: Generalized weakness       Communication      Cognition Arousal/Alertness: Awake/alert Behavior During Therapy: WFL for tasks assessed/performed;Flat affect Overall Cognitive Status: History of cognitive impairments - at baseline                                 General Comments: some memory impairment, pt deflects many detailed questions to DTR.      General Comments      Exercises Other Exercises Other Exercises: Sitting EOB x8 minutes, DTR at bedside   Assessment/Plan    PT Assessment Patient needs continued PT services  PT Problem List Decreased strength;Decreased range of motion;Decreased activity tolerance;Decreased balance;Decreased mobility;Decreased coordination;Decreased knowledge of use of DME;Decreased cognition;Decreased safety awareness;Decreased knowledge of precautions;Decreased skin integrity       PT Treatment  Interventions DME instruction;Gait training;Functional mobility training;Therapeutic activities;Therapeutic exercise;Patient/family education;Balance training;Neuromuscular re-education;Manual techniques    PT Goals (Current goals can be found in the Care Plan section)  Acute Rehab PT Goals Patient Stated Goal: tolerate AMB again without falls/dizziness PT Goal Formulation: With patient Time For Goal Achievement: 04/01/21 Potential to Achieve Goals: Fair    Frequency Min 2X/week   Barriers to discharge Inaccessible home environment stairs at entry    Co-evaluation               AM-PAC PT "6 Clicks" Mobility  Outcome Measure Help needed turning from your back to your side while in a flat bed without using bedrails?: A Lot Help needed moving from lying on your back to sitting on the side of a flat bed without using bedrails?: A Lot Help needed moving to and from a bed to a chair (including a wheelchair)?: Total Help needed standing up from a chair using your arms (e.g., wheelchair or bedside chair)?: Total Help needed to walk in hospital room?: Total Help needed climbing 3-5 steps with a railing? : Total 6 Click Score: 8    End of Session Equipment Utilized During  Treatment: Oxygen Activity Tolerance: Patient tolerated treatment well;No increased pain Patient left: in bed;with family/visitor present;with call bell/phone within reach Nurse Communication: Mobility status;Weight bearing status;Precautions PT Visit Diagnosis: Difficulty in walking, not elsewhere classified (R26.2);Other abnormalities of gait and mobility (R26.89);Muscle weakness (generalized) (M62.81);Dizziness and giddiness (R42);History of falling (Z91.81)    Time: 1640-1740 PT Time Calculation (min) (ACUTE ONLY): 60 min   Charges:   PT Evaluation $PT Eval High Complexity: 1 High PT Treatments $Therapeutic Exercise: 8-22 mins       6:00 PM, 03/18/21 Etta Grandchild, PT, DPT Physical Therapist - [facility]  [phone] ([place])    [place] C 03/18/2021, 5:51 PM

## 2021-03-18 NOTE — Progress Notes (Signed)
PROGRESS NOTE    Christopher [name]  [id number] DOB: 06-10-1951 DOA: 03/17/2021 PCP: Christopher Aus, MD   Brief Narrative:  Christopher [name] is a 70 y.o. male with medical history significant for morbid obesity, obstructive sleep apnea on CPAP, hypothyroidism, depression, chronic diastolic dysfunction CHF, status post ORIF for closed left ankle fracture who referred to the emergency room by his primary care provider for evaluation of left foot pain and increased drainage from left foot wound. Patient had an ankle fracture in April and had surgery to his left ankle.  According to the daughter he has had limited mobility since after his surgery. X-ray of left foot shows diffuse soft tissue swelling.  Diffuse osteopenia with degenerative changes and a questionable subtle erosion at the base of left fifth metatarsal. Podiatry was consulted for concern of osteomyelitis.  They does not think he has osteo-, more like superficial macerations, no obvious infection and they are recommending conservative management with better day and dressings and Unna boot wrap. Patient was initially started on ceftriaxone and metronidazole which was discontinued today.  Subjective: Per patient pain is better.  He was feeling mild dizziness on standing.  He does not ambulate a whole lot at baseline.  Appetite normal.  Assessment & Plan:   Principal Problem:   Osteomyelitis of ankle or foot, acute, left (HCC) Active Problems:   OSA on CPAP   Morbid obesity with BMI of 50.0-59.9, adult (HCC)   Depression   Hypothyroid   Chronic diastolic CHF (congestive heart failure) (HCC)   Acute on chronic respiratory failure (HCC)  Left foot pain.  Some concern of osteomyelitis, ESR within normal limit and CRP at 1.3.  Podiatry was consulted and they are not concerned about any osteo and recommending conservative management and outpatient podiatry follow-up. Blood cultures negative and wound cultures pending. Patient does has  well-healing wound dehiscence from his recent left ankle ORIF.  Wound care was consulted and they put their recommendations. Patient will need home health services and wound care on discharge. -Antibiotics were discontinued after discussing with podiatry. -PT/OT evaluation. -Continue with wound care -Unna boot for chronic lymphedema and venous congestion.  Chronic diastolic dysfunction.  Appears stable. -Continue home dose of furosemide and metolazone.  Anxiety and depression. -Continue bupropion.  Hypothyroidism. -Continue home dose of Synthroid.  Peripheral neuropathy. -Continue home dose of gabapentin.  Acute on chronic hypercapnic respiratory failure.  On presentation patient was very lethargic and appears confused with some visual hallucinations per daughter.  ABG consistent with hypercapnia. Symptoms improved with BiPAP.  Most likely secondary to hypoventilation syndrome with morbid obesity.  Now at baseline.  Patient uses 2 L of oxygen at home. -Continue with supplemental oxygen -Continue with CPAP while sleeping-might get benefit with BiPAP-we will need outpatient evaluation.  Stage III obesity. Estimated body mass index is 46.03 kg/m as calculated from the following:   Height as of this encounter: '5\' 11"'  (1.803 m).   Weight as of this encounter: 149.7 kg.  This will complicate overall prognosis. Counseling was provided.  Objective: Vitals:   03/17/21 2220 03/18/21 0400 03/18/21 0814 03/18/21 1208  BP: 109/62 (!) 108/56 116/63 (!) 130/91  Pulse: 72 78 73 76  Resp: '14  18 18  ' Temp: 98.4 F (36.9 C) 98.6 F (37 C) 98.1 F (36.7 C) 97.9 F (36.6 C)  TempSrc:  Oral    SpO2: 98% 100% 96% 97%  Weight:      Height:        Intake/Output  Summary (Last 24 hours) at 03/18/2021 1326 Last data filed at 03/18/2021 9622 Gross per 24 hour  Intake 349.25 ml  Output 600 ml  Net -250.75 ml   Filed Weights   03/17/21 1311  Weight: (!) 149.7 kg    Examination:  General  exam: Morbidly obese gentleman, appears calm and comfortable  Respiratory system: Clear to auscultation. Respiratory effort normal. Cardiovascular system: S1 & S2 heard, RRR.  Gastrointestinal system: Soft, nontender, nondistended, bowel sounds positive. Central nervous system: Alert and oriented. No focal neurological deficits.Symmetric 5 x 5 power. Extremities: Bilateral lower extremity lymphedema, right more than left, mild erythema on right, some wound dehiscence on medial malleolus, multiple macerations and an eschar on left toes especially involving 3rd-5th toe.  No obvious drainage or signs of infection. Psychiatry: Judgement and insight appear normal. Mood & affect appropriate.    DVT prophylaxis: Lovenox Code Status: Full Family Communication: Called wife with no response. Disposition Plan:  Status is: Inpatient  Remains inpatient appropriate because:Inpatient level of care appropriate due to severity of illness  Dispo: The patient is from: Home              Anticipated d/c is to: Home              Patient currently is not medically stable to d/c.   Difficult to place patient No              Level of care: Progressive Cardiac  All the records are reviewed and case discussed with Care Management/Social Worker. Management plans discussed with the patient, nursing and they are in agreement.  Consultants:  Podiatry  Procedures:  Antimicrobials:   Data Reviewed: I have personally reviewed following labs and imaging studies  CBC: Recent Labs  Lab 03/17/21 1321 03/18/21 0550  WBC 7.6 6.5  NEUTROABS 5.5  --   HGB 14.1 12.1*  HCT 47.8 40.5  MCV 97.6 96.9  PLT 232 297   Basic Metabolic Panel: Recent Labs  Lab 03/17/21 1321 03/18/21 0550  NA 143 143  K 4.9 4.5  CL 95* 96*  CO2 40* 37*  GLUCOSE 114* 87  BUN 31* 29*  CREATININE 1.50* 1.19  CALCIUM 9.4 8.8*   GFR: Estimated Creatinine Clearance: 85.9 mL/min (by C-G formula based on SCr of 1.19 mg/dL). Liver  Function Tests: Recent Labs  Lab 03/17/21 1321  AST 11*  ALT 7  ALKPHOS 54  BILITOT 1.1  PROT 7.5  ALBUMIN 3.9   No results for input(s): LIPASE, AMYLASE in the last 168 hours. No results for input(s): AMMONIA in the last 168 hours. Coagulation Profile: Recent Labs  Lab 03/17/21 1321  INR 1.1   Cardiac Enzymes: No results for input(s): CKTOTAL, CKMB, CKMBINDEX, TROPONINI in the last 168 hours. BNP (last 3 results) No results for input(s): PROBNP in the last 8760 hours. HbA1C: Recent Labs    03/17/21 1321  HGBA1C 5.0   CBG: No results for input(s): GLUCAP in the last 168 hours. Lipid Profile: No results for input(s): CHOL, HDL, LDLCALC, TRIG, CHOLHDL, LDLDIRECT in the last 72 hours. Thyroid Function Tests: No results for input(s): TSH, T4TOTAL, FREET4, T3FREE, THYROIDAB in the last 72 hours. Anemia Panel: No results for input(s): VITAMINB12, FOLATE, FERRITIN, TIBC, IRON, RETICCTPCT in the last 72 hours. Sepsis Labs: Recent Labs  Lab 03/17/21 1321 03/17/21 1513  LATICACIDVEN 0.7 0.9    Recent Results (from the past 240 hour(s))  Culture, blood (Routine x 2)     Status: None (  Preliminary result)   Collection Time: 03/17/21  1:21 PM   Specimen: BLOOD  Result Value Ref Range Status   Specimen Description BLOOD RIGTH [facility]  Final   Special Requests   Final    BOTTLES DRAWN AEROBIC AND ANAEROBIC Blood Culture results may not be optimal due to an excessive volume of blood received in culture bottles   Culture   Final    NO GROWTH < 24 HOURS Performed at [facility], [address] [place], [place] [address]    Report Status PENDING  Incomplete  Resp Panel by RT-PCR (Flu A&B, Covid) Nasopharyngeal Swab     Status: None   Collection Time: 03/17/21  1:21 PM   Specimen: Nasopharyngeal Swab; Nasopharyngeal(NP) swabs in vial transport medium  Result Value Ref Range Status   SARS Coronavirus 2 by RT PCR NEGATIVE NEGATIVE Final    Comment: (NOTE) SARS-CoV-2  target nucleic acids are NOT DETECTED.  The SARS-CoV-2 RNA is generally detectable in upper respiratory specimens during the acute phase of infection. The lowest concentration of SARS-CoV-2 viral copies this assay can detect is 138 copies/mL. A negative result does not preclude SARS-Cov-2 infection and should not be used as the sole basis for treatment or other patient management decisions. A negative result may occur with  improper specimen collection/handling, submission of specimen other than nasopharyngeal swab, presence of viral mutation(s) within the areas targeted by this assay, and inadequate number of viral copies(<138 copies/mL). A negative result must be combined with clinical observations, patient history, and epidemiological information. The expected result is Negative.  Fact Sheet for Patients:  EntrepreneurPulse.com.au  Fact Sheet for Healthcare Providers:  IncredibleEmployment.be  This test is no t yet approved or cleared by the [place] FDA and  has been authorized for detection and/or diagnosis of SARS-CoV-2 by FDA under an Emergency Use Authorization (EUA). This EUA will remain  in effect (meaning this test can be used) for the duration of the COVID-19 declaration under Section 564(b)(1) of the Act, 21 U.S.C.section 360bbb-3(b)(1), unless the authorization is terminated  or revoked sooner.       Influenza A by PCR NEGATIVE NEGATIVE Final   Influenza B by PCR NEGATIVE NEGATIVE Final    Comment: (NOTE) The Xpert Xpress SARS-CoV-2/FLU/RSV plus assay is intended as an aid in the diagnosis of influenza from Nasopharyngeal swab specimens and should not be used as a sole basis for treatment. Nasal washings and aspirates are unacceptable for Xpert Xpress SARS-CoV-2/FLU/RSV testing.  Fact Sheet for Patients: EntrepreneurPulse.com.au  Fact Sheet for Healthcare  Providers: IncredibleEmployment.be  This test is not yet approved or cleared by the [place] FDA and has been authorized for detection and/or diagnosis of SARS-CoV-2 by FDA under an Emergency Use Authorization (EUA). This EUA will remain in effect (meaning this test can be used) for the duration of the COVID-19 declaration under Section 564(b)(1) of the Act, 21 U.S.C. section 360bbb-3(b)(1), unless the authorization is terminated or revoked.  Performed at [facility], [address] [place], [place] [address]   Aerobic/Anaerobic Culture w Gram Stain (surgical/deep wound)     Status: None (Preliminary result)   Collection Time: 03/17/21  4:12 PM   Specimen: Wound  Result Value Ref Range Status   Specimen Description   Final    WOUND Performed at [facility], [address], [place], [place] [address]    Special Requests   Final    LEFT FOOT Performed at [facility], [address] [place], [place] [address]    Gram Stain   Final    NO WBC SEEN FEW GRAM VARIABLE ROD RARE GRAM POSITIVE COCCI IN PAIRS Performed at [facility], [place] [address] [place], [place] [address]    Culture MODERATE GRAM NEGATIVE RODS  Final   Report Status PENDING  Incomplete  Culture, blood (Routine x 2)     Status: None (Preliminary result)   Collection Time: 03/17/21  5:33 PM   Specimen: BLOOD  Result Value Ref Range Status   Specimen Description BLOOD BLOOD LEFT HAND  Final   Special Requests   Final    BOTTLES DRAWN AEROBIC AND ANAEROBIC Blood Culture adequate volume   Culture   Final    NO GROWTH < 24 HOURS Performed at [facility], [address], [place], [place] [address]    Report Status PENDING  Incomplete     Radiology Studies: DG Chest 2 View  Result Date: 03/17/2021 CLINICAL DATA:  Suspected sepsis. EXAM: CHEST - 2 VIEW COMPARISON:  12/16/2020 FINDINGS: Two views of the chest demonstrate mild enlargement of  the cardiac silhouette which is similar to the previous examination. Central vascular structures are prominent. No large pleural effusions. Degenerative changes in the thoracic spine. Negative for a pneumothorax. IMPRESSION: Vascular congestion or mild interstitial pulmonary edema. Electronically Signed   By: Markus Daft M.D.   On: 03/17/2021 15:01   DG Foot Complete Left  Result Date: 03/17/2021 CLINICAL DATA:  Suspected sepsis.  Foot infection. EXAM: LEFT FOOT - COMPLETE 3+ VIEW COMPARISON:  No prior. FINDINGS: Bandage is noted overlying the foot making evaluation difficult. Diffuse soft tissue swelling. Postsurgical changes left ankle. Hardware intact. Diffuse osteopenia and degenerative change. Questionable subtle erosion at the base of the left fifth metatarsal. No other focal bony abnormalities identified. No evidence of fracture or dislocation. IMPRESSION: 1. Bandage is noted overlying the foot making evaluation difficult. Diffuse soft tissue swelling. Postsurgical changes left ankle. Diffuse osteopenia degenerative change. 2. Questionable subtle erosion at the base of the left fifth metatarsal. Osteomyelitis cannot be completely excluded. Further evaluation with MRI can be obtained as needed. Electronically Signed   By: Marcello Moores  Register   On: 03/17/2021 15:03    Scheduled Meds:  atorvastatin  20 mg Oral Daily   buPROPion  150 mg Oral BID   cholecalciferol  1,000 Units Oral Daily   furosemide  40 mg Oral Daily   gabapentin  300 mg Oral TID   levothyroxine  88 mcg Oral Daily   mupirocin ointment   Topical Daily   sodium chloride flush  3 mL Intravenous Q12H   vitamin B-12  1,000 mcg Oral Once per day on Sun Wed   Continuous Infusions:  sodium chloride     cefTRIAXone (ROCEPHIN)  IV Stopped (03/17/21 2048)   And   metronidazole 500 mg (03/18/21 1002)     LOS: 1 day   Time spent: 40 minutes. More than 50% of the time was spent in counseling/coordination of care  Lorella Nimrod,  MD [facility]  If 7PM-7AM, please contact night-coverage [email or web address]  03/18/2021, 1:26 PM   This record has been created using Systems analyst. Errors have been sought and corrected,but may not always be located. Such creation errors do not reflect on the standard of care.

## 2021-03-18 NOTE — Consult Note (Signed)
[place] Nurse Consult Note: Reason for Consult:Left medial malleolus surgical wound. Nonhealing due to venous insufficiency.  Staples were removed.  1 cm x 0.6 cm x0.2 cm open wound present.  Scabbed lesion to left third toe with podiatry orders to paint daily with betadine and elevate legs for edema.  Wound type:nonhealing surgical with venous insufficiency Pressure Injury POA: NA Measurement: LEft third toe: 0.4 cm scabbed lesion to dorsal toe LEft medial malleolus:  1 cm x 0.6 cm x 0.2 cm  Wound [id number] to malleolus wound Drainage (amount, consistency, odor) minimal serosanguinous  dry scabbed toe wound Periwound:dry skin and edema to bilateral lower legs.  Dressing procedure/placement/frequency: Paint left third toe with betadine daily.  Cleanse left malleolus wound with NS and pat dry. Apply mupirocin ointment to wound bed. Cover with dry dressing daily.  Moisturize and elevate legs daily.  Will not follow at this time.  Please re-consult if needed.  Domenic Moras MSN, RN, FNP-BC CWON Wound, Ostomy, Continence Nurse Pager [phone]

## 2021-03-18 NOTE — Consult Note (Signed)
[facility] Consult Note  MRN : [id number]  Christopher [name] is a 70 y.o. ([birth year]) male who presents with chief complaint of  Chief Complaint  Patient presents with   Foot Pain   History of Present Illness:  Christopher [name] is a 70 year old male with medical history significant for morbid obesity, obstructive sleep apnea on CPAP, hypothyroidism, depression, chronic diastolic dysfunction CHF, status post ORIF for closed left ankle fracture who referred to the emergency room by his primary care provider for evaluation of left foot pain and increased drainage from left foot wound.  Patient had an ankle fracture in April and had surgery to his left ankle.  According to the daughter he has had limited mobility since after his surgery.  He was initially able to ambulate with a rolling walker but now the daughter states that it is very difficult for him to do that.   Patient had an Ace bandage placed on his left foot over 4 weeks ago and according to the daughter the bandage had not been changed since it was first placed.  They have noted increased drainage through the Ace bandage.  She also states that he has become increasingly confused over the last couple of weeks and has visual hallucinations.  He is also noted to speak to people who are not present.  He denies having any chest pain, no shortness of breath, no fever, no chills, no abdominal pain, no headache, no nausea, no vomiting, no urinary symptoms. He is oriented only to person and place but not to time and the daughter states this is nothing usual for him because he does not go anywhere and so does not keep track of days of the week or months of the year.  X-ray of the left foot shows diffuse soft tissue swelling.  Postsurgical changes involving the left ankle.  Diffuse osteopenia degenerative change.  Questionable subtle erosion at the base of the left fifth metatarsal. Osteomyelitis cannot be completely  excluded.  Vascular surgery was consulted by Dr. Luana Shu for possible endovascular intervention.  Current Facility-Administered Medications  Medication Dose Route Frequency Provider Last Rate Last Admin   0.9 %  sodium chloride infusion  250 mL Intravenous PRN Agbata, Tochukwu, MD       atorvastatin (LIPITOR) tablet 20 mg  20 mg Oral Daily Agbata, Tochukwu, MD   20 mg at 03/18/21 1001   buPROPion (WELLBUTRIN SR) 12 hr tablet 150 mg  150 mg Oral BID Agbata, Tochukwu, MD   150 mg at 03/18/21 1000   cholecalciferol (VITAMIN D3) tablet 1,000 Units  1,000 Units Oral Daily Agbata, Tochukwu, MD   1,000 Units at 03/18/21 1001   enoxaparin (LOVENOX) injection 75 mg  0.5 mg/kg Subcutaneous Q24H Patel, Kishan S, RPH       furosemide (LASIX) tablet 40 mg  40 mg Oral Daily Agbata, Tochukwu, MD   40 mg at 03/18/21 1001   gabapentin (NEURONTIN) capsule 300 mg  300 mg Oral TID Agbata, Tochukwu, MD   300 mg at 03/18/21 1000   HYDROcodone-acetaminophen (NORCO/VICODIN) 5-325 MG per tablet 1 tablet  1 tablet Oral Q6H PRN Agbata, Tochukwu, MD       levothyroxine (SYNTHROID) tablet 88 mcg  88 mcg Oral Daily Agbata, Tochukwu, MD   88 mcg at 03/18/21 0649   mupirocin ointment (BACTROBAN) 2 %   Topical Daily Lorella Nimrod, MD   Given at 03/18/21 1336   ondansetron (ZOFRAN) tablet 4 mg  4 mg  Oral Q6H PRN Agbata, Tochukwu, MD       Or   ondansetron (ZOFRAN) injection 4 mg  4 mg Intravenous Q6H PRN Agbata, Tochukwu, MD       sodium chloride flush (NS) 0.9 % injection 3 mL  3 mL Intravenous Q12H Agbata, Tochukwu, MD   3 mL at 03/18/21 1017   sodium chloride flush (NS) 0.9 % injection 3 mL  3 mL Intravenous PRN Agbata, Tochukwu, MD       vitamin B-12 (CYANOCOBALAMIN) tablet 1,000 mcg  1,000 mcg Oral Once per day on Sun Wed Agbata, Tochukwu, MD   1,000 mcg at 03/18/21 1000   Past Medical History:  Diagnosis Date   Anxiety    CHF (congestive heart failure) ([place])    Depression    Dysrhythmia    Hypothyroidism    Sleep  apnea    Past Surgical History:  Procedure Laterality Date   FRACTURE SURGERY     ORIF ANKLE FRACTURE Left 12/17/2020   Procedure: OPEN REDUCTION INTERNAL FIXATION (ORIF) ANKLE FRACTURE;  Surgeon: Samara Deist, DPM;  Location: [facility] ORS;  Service: Podiatry;  Laterality: Left;   Social History Social History   Tobacco Use   Smoking status: Never   Smokeless tobacco: Never  Vaping Use   Vaping Use: Never used  Substance Use Topics   Alcohol use: Not Currently   Drug use: Never   Family History Family History  Problem Relation Age of Onset   Hypertension Mother   Denies family history of peripheral artery disease, venous disease or renal disease.  Allergies  Allergen Reactions   Albuterol Other (See Comments)    Causes afib    Grass Extracts [Gramineae Pollens] Other (See Comments)    Sneeze and watery eyes   Other     Other reaction(s): Other (See Comments) Unable to breath when around cats   REVIEW OF SYSTEMS (Negative unless checked)  Constitutional: '[]'$ Weight loss  '[]'$ Fever  '[]'$ Chills Cardiac: '[]'$ Chest pain   '[]'$ Chest pressure   '[]'$ Palpitations   '[]'$ Shortness of breath when laying flat   '[]'$ Shortness of breath at rest   '[]'$ Shortness of breath with exertion. Vascular:  '[x]'$ Pain in legs with walking   '[]'$ Pain in legs at rest   '[]'$ Pain in legs when laying flat   '[]'$ Claudication   '[]'$ Pain in feet when walking  '[]'$ Pain in feet at rest  '[]'$ Pain in feet when laying flat   '[]'$ History of DVT   '[x]'$ Phlebitis   '[]'$ Swelling in legs   '[]'$ Varicose veins   '[]'$ Non-healing ulcers Pulmonary:   '[]'$ Uses home oxygen   '[]'$ Productive cough   '[]'$ Hemoptysis   '[]'$ Wheeze  '[]'$ COPD   '[]'$ Asthma Neurologic:  '[]'$ Dizziness  '[]'$ Blackouts   '[]'$ Seizures   '[]'$ History of stroke   '[]'$ History of TIA  '[]'$ Aphasia   '[]'$ Temporary blindness   '[]'$ Dysphagia   '[]'$ Weakness or numbness in arms   '[]'$ Weakness or numbness in legs Musculoskeletal:  '[]'$ Arthritis   '[]'$ Joint swelling   '[]'$ Joint pain   '[]'$ Low back pain Hematologic:  '[]'$ Easy bruising  '[]'$ Easy bleeding    '[]'$ Hypercoagulable state   '[]'$ Anemic  '[]'$ Hepatitis Gastrointestinal:  '[]'$ Blood in stool   '[]'$ Vomiting blood  '[]'$ Gastroesophageal reflux/heartburn   '[]'$ Difficulty swallowing. Genitourinary:  '[]'$ Chronic kidney disease   '[]'$ Difficult urination  '[]'$ Frequent urination  '[]'$ Burning with urination   '[]'$ Blood in urine Skin:  '[]'$ Rashes   '[]'$ Ulcers   '[]'$ Wounds Psychological:  '[]'$ History of anxiety   '[]'$  History of major depression.  Physical Examination  Vitals:   03/17/21 2220 03/18/21 0400 03/18/21 [id number]  03/18/21 1208  BP: 109/62 (!) 108/56 116/63 (!) 130/91  Pulse: 72 78 73 76  Resp: '14  18 18  '$ Temp: 98.4 F (36.9 C) 98.6 F (37 C) 98.1 F (36.7 C) 97.9 F (36.6 C)  TempSrc:  Oral    SpO2: 98% 100% 96% 97%  Weight:      Height:       Body mass index is 46.03 kg/m. Gen:  WD/WN, NAD Head: [place]/AT, No temporalis wasting. Prominent temp pulse not noted. Ear/Nose/Throat: Hearing grossly intact, nares w/o erythema or drainage, oropharynx w/o Erythema/Exudate Eyes: Sclera non-icteric, conjunctiva clear Neck: Trachea midline.  No JVD.  Pulmonary:  Good air movement, respirations not labored, equal bilaterally.  Cardiac: RRR, normal S1, S2. Vascular:  Vessel Right Left  Radial Palpable Palpable  Ulnar Palpable Palpable  Brachial Palpable Palpable  Carotid Palpable, without bruit Palpable, without bruit  Aorta Not palpable N/A  Femoral Palpable Palpable  Popliteal Palpable Palpable  PT Non-Palpable Non-Palpable  DP Non-Palpable Non-Palpable   Left lower extremity: Thigh soft.  Calf soft.  Extremities warm distally toes.  Severe venous stasis changes noted to the extremity.  Scattered shallow ulcerations to the toes and dorsum of the foot.  Right lower extremity: Thigh soft.  Calf soft.  Extremities warm distally toes.  Severe stasis changes noted to the extremity.  No wounds.  Gastrointestinal: soft, non-tender/non-distended. No guarding/reflex.  Musculoskeletal: M/S 5/5 throughout.  Extremities without  ischemic changes.  No deformity or atrophy. No edema. Neurologic: Sensation grossly intact in extremities.  Symmetrical.  Speech is fluent. Motor exam as listed above. Psychiatric: Judgment intact, Mood & affect appropriate for pt's clinical situation. Dermatologic: As above  Lymph : No Cervical, Axillary, or Inguinal lymphadenopathy.  CBC Lab Results  Component Value Date   WBC 6.5 03/18/2021   HGB 12.1 (L) 03/18/2021   HCT 40.5 03/18/2021   MCV 96.9 03/18/2021   PLT 186 03/18/2021   BMET    Component Value Date/Time   NA 143 03/18/2021 0550   K 4.5 03/18/2021 0550   CL 96 (L) 03/18/2021 0550   CO2 37 (H) 03/18/2021 0550   GLUCOSE 87 03/18/2021 0550   BUN 29 (H) 03/18/2021 0550   CREATININE 1.19 03/18/2021 0550   CALCIUM 8.8 (L) 03/18/2021 0550   GFRNONAA >60 03/18/2021 0550   Estimated Creatinine Clearance: 85.9 mL/min (by C-G formula based on SCr of 1.19 mg/dL).  COAG Lab Results  Component Value Date   INR 1.1 03/17/2021   INR 1.1 12/16/2020   Radiology DG Chest 2 View  Result Date: 03/17/2021 CLINICAL DATA:  Suspected sepsis. EXAM: CHEST - 2 VIEW COMPARISON:  12/16/2020 FINDINGS: Two views of the chest demonstrate mild enlargement of the cardiac silhouette which is similar to the previous examination. Central vascular structures are prominent. No large pleural effusions. Degenerative changes in the thoracic spine. Negative for a pneumothorax. IMPRESSION: Vascular congestion or mild interstitial pulmonary edema. Electronically Signed   By: Markus Daft M.D.   On: 03/17/2021 15:01   DG Foot Complete Left  Result Date: 03/17/2021 CLINICAL DATA:  Suspected sepsis.  Foot infection. EXAM: LEFT FOOT - COMPLETE 3+ VIEW COMPARISON:  No prior. FINDINGS: Bandage is noted overlying the foot making evaluation difficult. Diffuse soft tissue swelling. Postsurgical changes left ankle. Hardware intact. Diffuse osteopenia and degenerative change. Questionable subtle erosion at the base of  the left fifth metatarsal. No other focal bony abnormalities identified. No evidence of fracture or dislocation. IMPRESSION: 1. Bandage is noted  overlying the foot making evaluation difficult. Diffuse soft tissue swelling. Postsurgical changes left ankle. Diffuse osteopenia degenerative change. 2. Questionable subtle erosion at the base of the left fifth metatarsal. Osteomyelitis cannot be completely excluded. Further evaluation with MRI can be obtained as needed. Electronically Signed   By: Marcello Moores  Register   On: 03/17/2021 15:03    Assessment/Plan Christopher [name] is a 70 year old male with medical history significant for morbid obesity, obstructive sleep apnea on CPAP, hypothyroidism, depression, chronic diastolic dysfunction CHF, status post ORIF for closed left ankle fracture who referred to the emergency room by his primary care provider for evaluation of left foot pain.   1.  Possible peripheral artery disease: Patient with multiple risk factors for atherosclerotic disease.  Patient does have superficial wounds noted to the left toes as well as dorsum of left foot.  None of these seem to be infected.  Hard to palpate pedal pulses on exam due to tissue changes and body habitus.  Since the patient only has superficial wounds to the left foot he is a candidate for official PAD work-up in the outpatient setting.  We will see the patient in approximately 1 week to undergo bilateral ABIs.  Patient is in agreement with the plan.  2.  Venous insufficiency versus lymphedema: Patient with severe venous stasis changes to the bilateral legs.  Patient with some superficial wounds to the bilateral legs on the medial aspect.  Patient with moderate edema.  Patient notes that he has never undergone an official venous work-up.  When we see the patient back to work him up for PAD we will also start the process for venous insufficiency versus lymphedema.  Patient is in agreement with the plan.  3.  Hyperlipidemia: On  aspirin and statin for medical management Encouraged good control as its slows the progression of atherosclerotic disease   Discussed in detail with Dr. Mayme Genta, PA-C  03/18/2021 1:48 PM  This note was created with Dragon medical transcription system.  Any error is purely unintentional

## 2021-03-18 NOTE — Progress Notes (Signed)
Initial Nutrition Assessment  DOCUMENTATION CODES:   Morbid obesity  INTERVENTION:   Ensure Max protein supplement BID, each supplement provides 150kcal and 30g of protein.  Ocuvite daily for wound healing (provides zinc, vitamin A, vitamin C, Vitamin E, copper, and selenium)  Vitamin C $RemoveB'250mg'[id number]$  po BID   NUTRITION DIAGNOSIS:   Increased nutrient needs related to wound healing as evidenced by estimated needs.  GOAL:   Patient will meet greater than or equal to 90% of their needs  MONITOR:   PO intake, Supplement acceptance, Labs, Weight trends, Skin, I & O's  REASON FOR ASSESSMENT:   Consult Wound healing  ASSESSMENT:   70 y/o male with h/o OSA, CHF, depression, anxiety, hypothyroid, CHF and HLD who is admitted with osteomyelitis  Met with pt in room today. Pt reports good appetite and oral intake at baseline. Pt reports that he is not eating well in hospital as he does not like the food; pt reports that his chicken was dry and his broccoli was hard but he did eat his mashed potatoes for lunch. RD discussed with pt the importance of adequate nutrition needed for wound healing. Pt does not drink any supplements at home but reports that he is willing to try chocolate Ensure Max in hospital. RD will add supplements and vitamins to support wound healing. Pt reports that he takes an eye health multivitamin at home. Recommend pt continue vitamin C until wound healing is complete. Per chart, pt appears to be down 40lbs(11%) over the past 3 months. Pt denies any recent weight loss; pt reports that at one point he weighed 430lbs years ago. RD unsure if chart weights are correct.   Medications reviewed and include: D3, lovenox, lasix, synthroid, B12  Labs reviewed: K 4.3 wnl, BUN 29(H)  NUTRITION - FOCUSED PHYSICAL EXAM:  Flowsheet Row Most Recent Value  Orbital Region No depletion  Upper Arm Region No depletion  Thoracic and Lumbar Region No depletion  Buccal Region No depletion   Temple Region No depletion  Clavicle Bone Region No depletion  Clavicle and Acromion Bone Region No depletion  Scapular Bone Region No depletion  Dorsal Hand No depletion  Patellar Region No depletion  Anterior Thigh Region No depletion  Posterior Calf Region No depletion  Edema (RD Assessment) Moderate  Hair Reviewed  Eyes Reviewed  Mouth Reviewed  Skin Reviewed  Nails Reviewed   Diet Order:   Diet Order             Diet regular Room service appropriate? Yes; Fluid consistency: Thin  Diet effective now                  EDUCATION NEEDS:   Education needs have been addressed  Skin:  Skin Assessment: Reviewed RN Assessment (Left medial malleolus wound 1 cm x 0.6 cm x0.2 cm, scabbed lesion to left third toe nonhealing surgical with venous insufficiency, pressure injury left third toe: 0.4 cm scabbed lesion to dorsal toe, left medial malleolus: 1 cm x 0.6 cm x 0.2 cm)  Last BM:  pta  Height:   Ht Readings from Last 1 Encounters:  03/17/21 $RemoveB'5\' 11"'chNMyOcy$  (1.803 m)    Weight:   Wt Readings from Last 1 Encounters:  03/17/21 (!) 149.7 kg    Ideal Body Weight:  78 kg  BMI:  Body mass index is 46.03 kg/m.  Estimated Nutritional Needs:   Kcal:  2700-3000kcal/day  Protein:  >135g/day  Fluid:  2.0-2.3L/day  Koleen Distance MS, RD, LDN Please  refer to [facility] for RD and/or RD on-call/weekend/after hours pager

## 2021-03-18 NOTE — Progress Notes (Signed)
ORTHOPAEDIC PROGRESS NOTE  REQUESTING PHYSICIAN: Lorella Nimrod, MD  Chief Complaint: L foot/leg wounds  HPI: Christopher [name] is a 70 y.o. male who presents today resting in bed comfortably.  Patient has a dressing applied to his left lower extremity today.  Patient has not worked with physical therapy yet since admission.  Patient denies nausea, vomiting, fever, chills.  Patient notes dizziness when trying to stand.  Past Medical History:  Diagnosis Date   Anxiety    CHF (congestive heart failure) ([place])    Depression    Dysrhythmia    Hypothyroidism    Sleep apnea    Past Surgical History:  Procedure Laterality Date   FRACTURE SURGERY     ORIF ANKLE FRACTURE Left 12/17/2020   Procedure: OPEN REDUCTION INTERNAL FIXATION (ORIF) ANKLE FRACTURE;  Surgeon: Samara Deist, DPM;  Location: [facility] ORS;  Service: Podiatry;  Laterality: Left;   Social History   Socioeconomic History   Marital status: Married    Spouse name: Not on file   Number of children: Not on file   Years of education: Not on file   Highest education level: Not on file  Occupational History   Not on file  Tobacco Use   Smoking status: Never   Smokeless tobacco: Never  Vaping Use   Vaping Use: Never used  Substance and Sexual Activity   Alcohol use: Not Currently   Drug use: Never   Sexual activity: Not Currently  Other Topics Concern   Not on file  Social History Narrative   Not on file   Social Determinants of Health   Financial Resource Strain: Not on file  Food Insecurity: Not on file  Transportation Needs: Not on file  Physical Activity: Not on file  Stress: Not on file  Social Connections: Not on file   Family History  Problem Relation Age of Onset   Hypertension Mother    Allergies  Allergen Reactions   Albuterol Other (See Comments)    Causes afib    Grass Extracts [Gramineae Pollens] Other (See Comments)    Sneeze and watery eyes   Other     Other reaction(s): Other (See  Comments) Unable to breath when around cats   Prior to Admission medications   Medication Sig Start Date End Date Taking? Authorizing Provider  aspirin 81 MG chewable tablet Chew 81 mg by mouth daily.    [provider]  atorvastatin (LIPITOR) 20 MG tablet Take 20 mg by mouth daily. 10/06/20   [provider]  buPROPion (WELLBUTRIN SR) 150 MG 12 hr tablet Take 150 mg by mouth 2 (two) times daily. 12/02/20   [provider]  Cholecalciferol 25 MCG (1000 UT) capsule Take 1,000 Units by mouth daily.    [provider]  Cyanocobalamin 1000 MCG LOZG Place 1 lozenge under the tongue 2 (two) times a week.    [provider]  furosemide (LASIX) 40 MG tablet Hold until followup with your outpatient doctor due to your kidney function worsening. 12/22/20   Enzo Bi, MD  gabapentin (NEURONTIN) 300 MG capsule Take 300 mg by mouth 3 (three) times daily. '300mg'$  three times after dinner spread out between hours of dinner and bed 04/07/20   [provider]  ibuprofen (ADVIL) 200 MG tablet Take 400 mg by mouth every 6 (six) hours as needed for mild pain or moderate pain.    [provider]  levalbuterol Penne Lash HFA) 45 MCG/ACT inhaler Inhale 2 puffs into the lungs every 4 (four)  hours as needed for wheezing.    [provider]  levothyroxine (SYNTHROID) 88 MCG tablet Take 88 mcg by mouth daily. 30 to 60 minutes before breakfast on an empty stomach and with a glass of water 12/02/20   [provider]  metolazone (ZAROXOLYN) 2.5 MG tablet Hold until followup with your outpatient doctor due to your kidney function worsening. 12/22/20   Enzo Bi, MD  Multiple Vitamins-Minerals (PRESERVISION AREDS 2) CAPS Take 1 capsule by mouth 2 (two) times daily.    [provider]  potassium chloride (KLOR-CON) 10 MEQ tablet Take 10 mEq by mouth 2 (two) times daily. 10/21/20   [provider]  trolamine salicylate (ASPERCREME) 10 % cream Apply  1 application topically as needed for muscle pain. Apply to knees and feet    [provider]   DG Chest 2 View  Result Date: 03/17/2021 CLINICAL DATA:  Suspected sepsis. EXAM: CHEST - 2 VIEW COMPARISON:  12/16/2020 FINDINGS: Two views of the chest demonstrate mild enlargement of the cardiac silhouette which is similar to the previous examination. Central vascular structures are prominent. No large pleural effusions. Degenerative changes in the thoracic spine. Negative for a pneumothorax. IMPRESSION: Vascular congestion or mild interstitial pulmonary edema. Electronically Signed   By: Markus Daft M.D.   On: 03/17/2021 15:01   DG Foot Complete Left  Result Date: 03/17/2021 CLINICAL DATA:  Suspected sepsis.  Foot infection. EXAM: LEFT FOOT - COMPLETE 3+ VIEW COMPARISON:  No prior. FINDINGS: Bandage is noted overlying the foot making evaluation difficult. Diffuse soft tissue swelling. Postsurgical changes left ankle. Hardware intact. Diffuse osteopenia and degenerative change. Questionable subtle erosion at the base of the left fifth metatarsal. No other focal bony abnormalities identified. No evidence of fracture or dislocation. IMPRESSION: 1. Bandage is noted overlying the foot making evaluation difficult. Diffuse soft tissue swelling. Postsurgical changes left ankle. Diffuse osteopenia degenerative change. 2. Questionable subtle erosion at the base of the left fifth metatarsal. Osteomyelitis cannot be completely excluded. Further evaluation with MRI can be obtained as needed. Electronically Signed   By: Marcello Moores  Register   On: 03/17/2021 15:03      Positive ROS: All other systems have been reviewed and were otherwise negative with the exception of those mentioned in the HPI and as above.  12 point ROS was performed.  Physical Exam: General: Alert and oriented.  No apparent distress.  Vascular: DP/PT pulses faintly palpable bilateral.  Capillary fill time appears to be intact to digits to both  feet.  Diffuse lymphedema noted to bilateral lower extremities left slightly worse than right.  Neuro:absent tactile sensation but gross sensation is intact  Derm: Maceration between all web spaces especially between the second and third and third and fourth toes.  Left fourth toe appears to be macerated.  No deep ulceration noted.  Minimal to no erythema, mild odor, minimal serous drainage.    Left medial malleolar incisional area appears to have an area of wound dehiscence likely due to large amount of swelling present.  Appears to probe down to the subcutaneous tissue and measures approximately 1.5 x 0.5 x 0.1 cm.  Mild serous drainage, no associated erythema, minimal odor.  Ortho/MS: Diffuse lower extremity lymphedema is noted to the left lower leg at this time.  Assessment: 1.  Severe maceration of interdigital spaces left foot 2.  Wound dehiscence left medial malleolar incision status post ORIF ankle 3.  Severe lymphedema 4.  History of left Lisfranc fracture dislocation.   Plan: -  Patient seen and examined today. -No signs of infection noted to the left foot.  Maceration still appears to be present between all interdigital spaces and wound dehiscence still remains to left medial malleolar incision site.  Likely these are due to combination of venous insufficiency as well as lymphedema. -Recommend Betadine paint be applied between all digits twice daily of the left foot. -Recommend Aquacel to the left medial malleolar wound followed by 4 x 4 gauze, Kerlix from the toes to below the knee and Ace wrap also from toes to below the knee with mild to moderate compression.  Recommend dressing changes every 2 to 3 days. -PT order placed.  Patient may be up as tolerated with use of boot to left lower extremity. -Podiatry team to sign off at this time.  Patient to follow-up with Dr. Vickki Muff within 2 weeks of discharge date.  Caroline More, DPM  03/18/2021 1:10 PM

## 2021-03-19 DIAGNOSIS — M86172 Other acute osteomyelitis, left ankle and foot: Secondary | ICD-10-CM | POA: Diagnosis not present

## 2021-03-19 MED ORDER — ONDANSETRON HCL 4 MG/2ML IJ SOLN: 4.0000 mg | Freq: Four times a day (QID) | INTRAMUSCULAR | Status: DC | PRN

## 2021-03-19 MED ORDER — HYDROMORPHONE HCL 1 MG/ML IJ SOLN
1.0000 mg | Freq: Once | INTRAMUSCULAR | Status: DC | PRN
Start: 2021-03-19 — End: 2021-03-20

## 2021-03-19 MED ORDER — ENSURE MAX PROTEIN PO LIQD: 11.0000 [oz_av] | Freq: Two times a day (BID) | ORAL | 12 refills | Status: DC

## 2021-03-19 MED ORDER — SODIUM CHLORIDE 0.9 % IV SOLN: INTRAVENOUS | Status: DC

## 2021-03-19 MED ORDER — FUROSEMIDE 40 MG PO TABS: 40.0000 mg | ORAL_TABLET | ORAL | Status: DC

## 2021-03-19 MED ORDER — FAMOTIDINE 20 MG PO TABS: 40.0000 mg | ORAL_TABLET | Freq: Once | ORAL | Status: DC | PRN

## 2021-03-19 MED ORDER — METHYLPREDNISOLONE SODIUM SUCC 125 MG IJ SOLR: 125.0000 mg | Freq: Once | INTRAMUSCULAR | Status: DC | PRN

## 2021-03-19 MED ORDER — MUPIROCIN 2 % EX OINT: TOPICAL_OINTMENT | Freq: Every day | CUTANEOUS | 0 refills | Status: DC

## 2021-03-19 MED ORDER — MIDAZOLAM HCL 2 MG/ML PO SYRP
8.0000 mg | ORAL_SOLUTION | Freq: Once | ORAL | Status: DC | PRN
  Filled 2021-03-19: qty 4

## 2021-03-19 MED ORDER — DIPHENHYDRAMINE HCL 50 MG/ML IJ SOLN: 50.0000 mg | Freq: Once | INTRAMUSCULAR | Status: DC | PRN

## 2021-03-19 MED ORDER — CEFAZOLIN SODIUM-DEXTROSE 1-4 GM/50ML-% IV SOLN
1.0000 g | Freq: Once | INTRAVENOUS | Status: DC
  Filled 2021-03-19: qty 50

## 2021-03-19 NOTE — Evaluation (Signed)
Occupational Therapy Evaluation Patient Details Name: Christopher [name] MRN: [id number] DOB: 17-Jul-1951 Today's Date: 03/19/2021    History of Present Illness Dornell Grasmick is a 31yoM who comes to [facility] with Left foot pain. Per DTR pt has had decline in mobility since ankle ORIF April 2022, recently more confused and with visual hallucination. PMH: morbid obesity, OSA on CPAP, hypoTSH, depression, dCHF, Left ankle fx s/p ORIF. Pt seen by podiatry, feels pt has new wounds related to venous insufficiceny without evidence of surgical infection.  Podiatry recommending actvity as tolerated with post op boot when up mobilizing.  Pt has been working with [employer] for >4 weeks, has not been able to return to any appreciable walking aside from SPT WC to/from recliner. Pt has sustained several falls at home, some involving legs buckling.   Clinical Impression   Pt seen for OT evaluation this date in setting of acute hospitalization d/t increasing L LE pain and confusion. Pt presents this date with general weakness, decreased activity tolerance, and decreased balance; impacting his ability to safely and efficiently complete ADLs/ADL mobility. Pt reporting he is able to bathe, dress and toilet himself at baseline, but via telephone, visual conference with daughter while seeing patient, she reports that he has actually been steadily declining since April 2022 (initial ankle fx to L LE) and lately-has been requiring assist for nearly all LB ADLs and all IADLs including cooking, cleaning, LB dressing and donning his boot. Pt requires MIN A +2 to CTS from elevated EOB, and requires MOD A +2 to CTS from lower surface or to SPS to recliner. Pt able to shuffle and pivot feet, but does not take meaningful steps for this transfer. Pt currently requiring SETUP to MIN A for seated UB ADLs, and MAX A for seated LB ADLs. Pt apprehensive about transfers d/t dizziness, and while his BP is noted to drop some, is not necessarily orthostatic.  Pt left in chair with all needs met and in reach, BLE elevated. RN notified of session contents and recommendation. Because pt is requiring increased assistance to stand, transfer, scoot, dress and otherwise safely perform ADLs/ADL mobility, anticipate he will require STR f/u OT services in SNF.   BP as follows: sitting 103/59, standing 90/65, sitting at end of session 122/66. Also of note: pt reports he is only on nasal cannula during the night, but when removed, he gradually de-sat to mid 80s, ultimately 2Lnc left on throughout session to sustain safe saturations.    Follow Up Recommendations  SNF    Equipment Recommendations  3 in 1 bedside commode;Tub/shower seat;Other (comment) (2ww Bari)    Recommendations for Other Services       Precautions / Restrictions Precautions Precautions: Fall Restrictions Weight Bearing Restrictions: Yes LLE Weight Bearing: Weight bearing as tolerated Other Position/Activity Restrictions: WBAT in cam boot when up      Mobility Bed Mobility Overal bed mobility: Needs Assistance Bed Mobility: Supine to Sit     Supine to sit: Min assist;Mod assist;HOB elevated     General bed mobility comments: increased time    Transfers Overall transfer level: Needs assistance Equipment used: Rolling walker (2 wheeled) (bari) Transfers: Sit to/from Stand;Lateral/Scoot Transfers;Stand Pivot Transfers Sit to Stand: Min assist;Mod assist;+2 physical assistance Stand pivot transfers: Mod assist;+2 physical assistance (to recliner)      Lateral/Scoot Transfers: Min guard;Supervision (lateral scoot at EOB) General transfer comment: MIN +2 from low surface, MOD A +2 from low surface    Balance Overall balance assessment: Needs assistance  Sitting-balance support: Feet supported Sitting balance-Leahy Scale: Good       Standing balance-Leahy Scale: Poor Standing balance comment: requires UE support and external support to sustain short static stand of ~20-30  seconds.                           ADL either performed or assessed with clinical judgement   ADL                                         General ADL Comments: SETUP for most seated UB ADLs, MIN A for UB dressing. MAX to TOTAL A for LB dressing, does make good attempt to assist with donning sock to R LE.     Vision Patient Visual Report: No change from baseline       Perception     Praxis      Pertinent Vitals/Pain Pain Assessment: No/denies pain     Hand Dominance     Extremity/Trunk Assessment Upper Extremity Assessment Upper Extremity Assessment: Generalized weakness   Lower Extremity Assessment Lower Extremity Assessment: Generalized weakness (L LE noted to be L laterally rotated.)       Communication Communication Communication: No difficulties   Cognition Arousal/Alertness: Awake/alert Behavior During Therapy: WFL for tasks assessed/performed;Flat affect Overall Cognitive Status: History of cognitive impairments - at baseline                                 General Comments: A&O to basics, some memory impairment at baseline. Able to follow all commands.   General Comments       Exercises Other Exercises Other Exercises: sitting EOB x15 mins, stand x1 for 30 secs, stand pivot to chair with MOD A +2. Role of OT education. D/c recs   Shoulder Instructions      Home Living Family/patient expects to be discharged to:: Private residence Living Arrangements: Children;Spouse/significant other (DTR assists pt and his spouse) Available Help at Discharge: Available 24 hours/day Type of Home: House Home Access: Stairs to enter [employer] of Steps: 2 Entrance Stairs-Rails: Right;Left Home Layout: Able to live on main level with bedroom/bathroom         Bathroom Toilet: Handicapped height     Home Equipment: Environmental consultant - 2 wheels;Walker - 4 wheels;Wheelchair - [employer];Shower seat - built  in;Other (comment) (sleeps in recliner)          Prior Functioning/Environment Level of Independence: Needs assistance  Gait / Transfers Assistance Needed: very limited fxl mobility in the home, sleeps in recliner, uses rollator for mobility, but dtr endorses that patient has mostly only been pivoting to a bari [facility] since May 2022. Only walks when HHPT comes 1x/wk. ADL's / Homemaking Assistance Needed: Pt has been assisted by his daughter for LB bathing and dressing since initial fracture in April 2022. Pt was able to cook/clean and perform other IADLs. In recent months since fracture, has been relying on family assistance for IADLs as well.   Comments: pt has been functionally very limited since initial fracture, admits that he has been having to put more weight on it than he was supposed to        OT Problem List: Decreased strength;Decreased activity tolerance;Impaired balance (sitting and/or standing);Cardiopulmonary status limiting activity;Obesity;Decreased knowledge of use of DME or  AE      OT Treatment/Interventions: Self-care/ADL training;DME and/or AE instruction;Therapeutic activities;Balance training;Therapeutic exercise;Energy conservation;Patient/family education    OT Goals(Current goals can be found in the care plan section) Acute Rehab OT Goals Patient Stated Goal: get stronger, be able to walk again OT Goal Formulation: With patient/family Time For Goal Achievement: 04/02/21 Potential to Achieve Goals: Good ADL Goals Pt Will Perform Lower Body Dressing: with min assist;with mod assist;sit to/from stand (with AE PRN) Pt Will Transfer to Toilet: with min guard assist;with min assist;bedside commode;ambulating (with bari RW, ~10' to [facility] to increase tolerance for fxl distances) Pt Will Perform Toileting - Clothing Manipulation and hygiene: with min assist;with mod assist;sit to/from stand Pt/caregiver will Perform Home Exercise Program: Increased strength;Both right and left  upper extremity;With Supervision (and core strength) Additional ADL Goal #1: Pt will tolerate standing with bari RW x5-6 mins while completing g/h task versus therapeutic activity to increase fxl standing tolerance.  OT Frequency: Min 1X/week   Barriers to D/C:            Co-evaluation PT/OT/SLP Co-Evaluation/Treatment: Yes Reason for Co-Treatment: For patient/therapist safety;To address functional/ADL transfers PT goals addressed during session: Mobility/safety with mobility;Balance OT goals addressed during session: Proper use of Adaptive equipment and DME;ADL's and self-care      AM-PAC OT "6 Clicks" Daily Activity     Outcome Measure Help from another person eating meals?: None Help from another person taking care of personal grooming?: A Little Help from another person toileting, which includes using toliet, bedpan, or urinal?: A Lot Help from another person bathing (including washing, rinsing, drying)?: A Lot Help from another person to put on and taking off regular upper body clothing?: A Little Help from another person to put on and taking off regular lower body clothing?: A Lot 6 Click Score: 16   End of Session Equipment Utilized During Treatment: Gait belt;Rolling walker;Oxygen Nurse Communication: Mobility status  Activity Tolerance: Patient tolerated treatment well Patient left: in chair;with call bell/phone within reach  OT Visit Diagnosis: Unsteadiness on feet (R26.81);Muscle weakness (generalized) (M62.81);History of falling (Z91.81)                Time: 4037-5436 OT Time Calculation (min): 66 min Charges:  OT General Charges $OT Visit: 1 Visit OT Evaluation $OT Eval Moderate Complexity: 1 Mod OT Treatments $Self Care/Home Management : 8-22 mins $Therapeutic Activity: 8-22 mins  Gerrianne Scale, MS, OTR/L ascom [phone] 03/19/21, 1:58 PM

## 2021-03-19 NOTE — Care Management Important Message (Signed)
Important Message  Patient Details  Name: Christopher [name] MRN: [id number] Date of Birth: [birth year]   Medicare Important Message Given:  N/A - LOS <3 / Initial given by admissions  Initial Medicare IM reviewed with Cecil Cranker, daughter by Malachy Moan, Patient Access Associate on 03/19/2021 at 11:27am.   Dannette Barbara 03/19/2021, 7:17 PM

## 2021-03-19 NOTE — TOC Initial Note (Signed)
Transition of Care [facility]) - Initial/Assessment Note    Patient Details  Name: Christopher [name] MRN: [id number] Date of Birth: [birth year]  Transition of [facility]) CM/SW Contact:    Alberteen Sam, LCSW Phone Number: 03/19/2021, 1:05 PM  Clinical Narrative:                  CSW spoke with patient's daughter [name] who reports change in PT recs from [facility] to SNF, she and patient are agreeable. She reports preference for [facility] or [facility], agreeable for CSW to send referrals for bed offer.   Pending PT to put in notes reflecting updated recs for insurance auth with [facility] to be started.    Expected Discharge Plan: Skilled Nursing Facility Barriers to Discharge: SNF Pending bed offer   Patient Goals and CMS Choice   CMS Medicare.gov Compare Post Acute Care list provided to:: Patient Represenative (must comment) (daughter [name]) Choice offered to / list presented to : Adult Children  Expected Discharge Plan and Services Expected Discharge Plan: [place] Acute Care Choice: [place] Living arrangements for the past 2 months: Single Family Home Expected Discharge Date: 03/19/21                                    Prior Living Arrangements/Services Living arrangements for the past 2 months: Single Family Home Lives with:: Relatives, Adult Children Patient language and need for interpreter reviewed:: Yes Do you feel safe going back to the place where you live?: No   needs short term rehab  Need for Family Participation in Patient Care: Yes (Comment) Care giver support system in place?: Yes (comment)   Criminal Activity/Legal Involvement Pertinent to Current Situation/Hospitalization: No - Comment as needed  Activities of Daily Living Home Assistive Devices/Equipment: Walker (specify type), Wheelchair, CPAP ADL Screening (condition at time of admission) Patient's cognitive ability adequate to safely complete daily activities?:  No Is the patient deaf or have difficulty hearing?: No Does the patient have difficulty seeing, even when wearing glasses/contacts?: No Does the patient have difficulty concentrating, remembering, or making decisions?: Yes Patient able to express need for assistance with ADLs?: Yes Does the patient have difficulty dressing or bathing?: Yes Independently performs ADLs?: No Communication: Independent Dressing (OT): Needs assistance Is this a change from baseline?: Pre-admission baseline Grooming: Needs assistance Is this a change from baseline?: Pre-admission baseline Feeding: Independent Bathing: Needs assistance Is this a change from baseline?: Pre-admission baseline Toileting: Needs assistance Is this a change from baseline?: Pre-admission baseline In/Out Bed: Needs assistance Is this a change from baseline?: Pre-admission baseline Walks in Home: Needs assistance Is this a change from baseline?: Pre-admission baseline Does the patient have difficulty walking or climbing stairs?: Yes Weakness of Legs: Both Weakness of Arms/Hands: None  Permission Sought/Granted Permission sought to share information with : Case Manager, Customer service manager, Family Supports Permission granted to share information with : Yes, Verbal Permission Granted  Share Information with NAME: [name]  Permission granted to share info w AGENCY: SNF  Permission granted to share info w Relationship: daughter  Permission granted to share info w Contact Information: [phone]  Emotional Assessment       Orientation: : Oriented to Self Alcohol / Substance Use: Not Applicable Psych Involvement: No (comment)  Admission diagnosis:  Osteomyelitis of ankle or foot, acute, left ([place]) [M86.172] Acute on chronic respiratory failure ([place]) [  J96.20] Gangrene of left foot [facility]) [I96] Patient Active Problem List   Diagnosis Date Noted   Osteomyelitis of ankle or foot, acute, left ([place]) 03/17/2021   Acute  on chronic respiratory failure ([place]) 03/17/2021   Osteomyelitis ([place]) 12/16/2020   Closed left ankle fracture 12/16/2020   OSA on CPAP 12/16/2020   Morbid obesity with BMI of 50.0-59.9, adult ([place]) 12/16/2020   Hyperlipidemia, mixed 12/16/2020   Depression 12/16/2020   Hypothyroid 12/16/2020   Chronic diastolic CHF (congestive heart failure) ([place]) 12/16/2020   PCP:  Rusty Aus, MD Pharmacy:   [facility] [id number] [address], [place] [address] [place] [address] Phone: [phone] Fax: [phone]     Social Determinants of Health (SDOH) Interventions    Readmission Risk Interventions No flowsheet data found.

## 2021-03-19 NOTE — Progress Notes (Signed)
PROGRESS NOTE    Christopher [name]  ERX:[id number] DOB: [birth year] DOA: 03/17/2021 PCP: Rusty Aus, MD   Brief Narrative:  Christopher [name] is a 70 y.o. male with medical history significant for morbid obesity, obstructive sleep apnea on CPAP, hypothyroidism, depression, chronic diastolic dysfunction CHF, status post ORIF for closed left ankle fracture who referred to the emergency room by his primary care provider for evaluation of left foot pain and increased drainage from left foot wound. Patient had an ankle fracture in April and had surgery to his left ankle.  According to the daughter he has had limited mobility since after his surgery. X-ray of left foot shows diffuse soft tissue swelling.  Diffuse osteopenia with degenerative changes and a questionable subtle erosion at the base of left fifth metatarsal. Podiatry was consulted for concern of osteomyelitis.  They does not think he has osteo-, more like superficial macerations, no obvious infection and they are recommending conservative management with better day and dressings and Unna boot wrap. Patient was initially started on ceftriaxone and metronidazole which was discontinued. Podiatry and vascular surgery both wants to do outpatient work-up. PT is recommending SNF and patient and family would like to take advantage of that.  Subjective: Patient was seen and examined today.  No new complaint.  Eating breakfast.  No pain.  Assessment & Plan:   Principal Problem:   Osteomyelitis of ankle or foot, acute, left (HCC) Active Problems:   OSA on CPAP   Morbid obesity with BMI of 50.0-59.9, adult (HCC)   Depression   Hypothyroid   Chronic diastolic CHF (congestive heart failure) (HCC)   Acute on chronic respiratory failure (HCC)  Left foot pain.  Some concern of osteomyelitis, ESR within normal limit and CRP at 1.3.  Podiatry was consulted and they are not concerned about any osteo and recommending conservative management and  outpatient podiatry follow-up. Blood cultures negative and wound cultures pending. Patient does has well-healing wound dehiscence from his recent left ankle ORIF.  Wound care was consulted and they put their recommendations. -Antibiotics were discontinued after discussing with podiatry. -PT/OT evaluation-recommending SNF placement. -TOC is working on it -Continue with wound care -Haematologist for chronic lymphedema and venous congestion.  Chronic diastolic dysfunction.  Appears stable. -Continue home dose of furosemide and metolazone.  Anxiety and depression. -Continue bupropion.  Hypothyroidism. -Continue home dose of Synthroid.  Peripheral neuropathy. -Continue home dose of gabapentin.  Acute on chronic hypercapnic respiratory failure.  On presentation patient was very lethargic and appears confused with some visual hallucinations per daughter.  ABG consistent with hypercapnia. Symptoms improved with BiPAP.  Most likely secondary to hypoventilation syndrome with morbid obesity.  Now at baseline.  Patient uses 2 L of oxygen at home. -Continue with supplemental oxygen -Continue with CPAP while sleeping-might get benefit with BiPAP-we will need outpatient evaluation.  Stage III obesity. Estimated body mass index is 46.03 kg/m as calculated from the following:   Height as of this encounter: _0  (1.803 m).   Weight as of this encounter: 149.7 kg.  This will complicate overall prognosis. Counseling was provided.  Objective: Vitals:   03/18/21 2214 03/19/21 0224 03/19/21 0808 03/19/21 1112  BP:   (!) 90/43 (!) 91/51  Pulse:  88 75 82  Resp:   20 20  Temp:   97.9 F (36.6 C) 98.2 F (36.8 C)  TempSrc:      SpO2: 94% 93% 94% 95%  Weight:      Height:  Intake/Output Summary (Last 24 hours) at 03/19/2021 1404 Last data filed at 03/19/2021 1111 Gross per 24 hour  Intake 100 ml  Output 200 ml  Net -100 ml    Filed Weights   03/17/21 1311  Weight: (!) 149.7 kg     Examination:  General.  Morbidly obese gentleman, in no acute distress. Pulmonary.  Lungs clear bilaterally, normal respiratory effort. CV.  Regular rate and rhythm, no JVD, rub or murmur. Abdomen.  Soft, nontender, nondistended, BS positive. CNS.  Alert and oriented x3.  No focal neurologic deficit. Extremities.  Significant lymphedema and venous congestion involving both LE, clean bandage around left ankle, macerations and a small eschar involving third fourth and fifth left toes. Psychiatry.  Judgment and insight appears normal.   DVT prophylaxis: Lovenox Code Status: Full Family Communication: Discussed with patient. Disposition Plan:  Status is: Inpatient  Remains inpatient appropriate because:Inpatient level of care appropriate due to severity of illness  Dispo: The patient is from: Home              Anticipated d/c is to: SNF              Patient currently is medically stable for discharge   Difficult to place patient No              Level of care: Progressive Cardiac  All the records are reviewed and case discussed with Care Management/Social Worker. Management plans discussed with the patient, nursing and they are in agreement.  Consultants:  Podiatry Vascular surgery  Procedures:  Antimicrobials:   Data Reviewed: I have personally reviewed following labs and imaging studies  CBC: Recent Labs  Lab 03/17/21 1321 03/18/21 0550  WBC 7.6 6.5  NEUTROABS 5.5  --   HGB 14.1 12.1*  HCT 47.8 40.5  MCV 97.6 96.9  PLT 232 143    Basic Metabolic Panel: Recent Labs  Lab 03/17/21 1321 03/18/21 0550  NA 143 143  K 4.9 4.5  CL 95* 96*  CO2 40* 37*  GLUCOSE 114* 87  BUN 31* 29*  CREATININE 1.50* 1.19  CALCIUM 9.4 8.8*    GFR: Estimated Creatinine Clearance: 85.9 mL/min (by C-G formula based on SCr of 1.19 mg/dL). Liver Function Tests: Recent Labs  Lab 03/17/21 1321  AST 11*  ALT 7  ALKPHOS 54  BILITOT 1.1  PROT 7.5  ALBUMIN 3.9    No results  for input(s): LIPASE, AMYLASE in the last 168 hours. No results for input(s): AMMONIA in the last 168 hours. Coagulation Profile: Recent Labs  Lab 03/17/21 1321  INR 1.1    Cardiac Enzymes: No results for input(s): CKTOTAL, CKMB, CKMBINDEX, TROPONINI in the last 168 hours. BNP (last 3 results) No results for input(s): PROBNP in the last 8760 hours. HbA1C: Recent Labs    03/17/21 1321  HGBA1C 5.0    CBG: No results for input(s): GLUCAP in the last 168 hours. Lipid Profile: No results for input(s): CHOL, HDL, LDLCALC, TRIG, CHOLHDL, LDLDIRECT in the last 72 hours. Thyroid Function Tests: No results for input(s): TSH, T4TOTAL, FREET4, T3FREE, THYROIDAB in the last 72 hours. Anemia Panel: No results for input(s): VITAMINB12, FOLATE, FERRITIN, TIBC, IRON, RETICCTPCT in the last 72 hours. Sepsis Labs: Recent Labs  Lab 03/17/21 1321 03/17/21 1513  LATICACIDVEN 0.7 0.9     Recent Results (from the past 240 hour(s))  Culture, blood (Routine x 2)     Status: None (Preliminary result)   Collection Time: 03/17/21  1:21 PM  Specimen: BLOOD  Result Value Ref Range Status   Specimen Description BLOOD RIGTH [facility]  Final   Special Requests   Final    BOTTLES DRAWN AEROBIC AND ANAEROBIC Blood Culture results may not be optimal due to an excessive volume of blood received in culture bottles   Culture   Final    NO GROWTH 2 DAYS Performed at [facility], [address], [place], [place] [address]    Report Status PENDING  Incomplete  Resp Panel by RT-PCR (Flu A&B, Covid) Nasopharyngeal Swab     Status: None   Collection Time: 03/17/21  1:21 PM   Specimen: Nasopharyngeal Swab; Nasopharyngeal(NP) swabs in vial transport medium  Result Value Ref Range Status   SARS Coronavirus 2 by RT PCR NEGATIVE NEGATIVE Final    Comment: (NOTE) SARS-CoV-2 target nucleic acids are NOT DETECTED.  The SARS-CoV-2 RNA is generally detectable in upper respiratory specimens during the acute  phase of infection. The lowest concentration of SARS-CoV-2 viral copies this assay can detect is 138 copies/mL. A negative result does not preclude SARS-Cov-2 infection and should not be used as the sole basis for treatment or other patient management decisions. A negative result may occur with  improper specimen collection/handling, submission of specimen other than nasopharyngeal swab, presence of viral mutation(s) within the areas targeted by this assay, and inadequate number of viral copies(<138 copies/mL). A negative result must be combined with clinical observations, patient history, and epidemiological information. The expected result is Negative.  Fact Sheet for Patients:  EntrepreneurPulse.com.au  Fact Sheet for Healthcare Providers:  IncredibleEmployment.be  This test is no t yet approved or cleared by the [place] FDA and  has been authorized for detection and/or diagnosis of SARS-CoV-2 by FDA under an Emergency Use Authorization (EUA). This EUA will remain  in effect (meaning this test can be used) for the duration of the COVID-19 declaration under Section 564(b)(1) of the Act, 21 U.S.C.section 360bbb-3(b)(1), unless the authorization is terminated  or revoked sooner.       Influenza A by PCR NEGATIVE NEGATIVE Final   Influenza B by PCR NEGATIVE NEGATIVE Final    Comment: (NOTE) The Xpert Xpress SARS-CoV-2/FLU/RSV plus assay is intended as an aid in the diagnosis of influenza from Nasopharyngeal swab specimens and should not be used as a sole basis for treatment. Nasal washings and aspirates are unacceptable for Xpert Xpress SARS-CoV-2/FLU/RSV testing.  Fact Sheet for Patients: EntrepreneurPulse.com.au  Fact Sheet for Healthcare Providers: IncredibleEmployment.be  This test is not yet approved or cleared by the [place] FDA and has been authorized for detection and/or diagnosis of  SARS-CoV-2 by FDA under an Emergency Use Authorization (EUA). This EUA will remain in effect (meaning this test can be used) for the duration of the COVID-19 declaration under Section 564(b)(1) of the Act, 21 U.S.C. section 360bbb-3(b)(1), unless the authorization is terminated or revoked.  Performed at [facility], [address] [place], [place] [address]   Aerobic/Anaerobic Culture w Gram Stain (surgical/deep wound)     Status: None (Preliminary result)   Collection Time: 03/17/21  4:12 PM   Specimen: Wound  Result Value Ref Range Status   Specimen Description   Final    WOUND Performed at [facility], [address], [place], [place] [address]    Special Requests   Final    LEFT FOOT Performed at [facility], [address] [place], [place] [address]    Gram Stain   Final  NO WBC SEEN FEW GRAM VARIABLE ROD RARE GRAM POSITIVE COCCI IN PAIRS    Culture   Final    MODERATE GRAM NEGATIVE RODS CULTURE REINCUBATED FOR BETTER GROWTH Performed at [facility], [place] [address] [place], [place] [address]    Report Status PENDING  Incomplete  Culture, blood (Routine x 2)     Status: None (Preliminary result)   Collection Time: 03/17/21  5:33 PM   Specimen: BLOOD  Result Value Ref Range Status   Specimen Description BLOOD BLOOD LEFT HAND  Final   Special Requests   Final    BOTTLES DRAWN AEROBIC AND ANAEROBIC Blood Culture adequate volume   Culture   Final    NO GROWTH 2 DAYS Performed at [facility], [address], [place], [place] [address]    Report Status PENDING  Incomplete      Radiology Studies: DG Chest 2 View  Result Date: 03/17/2021 CLINICAL DATA:  Suspected sepsis. EXAM: CHEST - 2 VIEW COMPARISON:  12/16/2020 FINDINGS: Two views of the chest demonstrate mild enlargement of the cardiac silhouette which is similar to the previous examination. Central vascular structures are prominent. No large pleural  effusions. Degenerative changes in the thoracic spine. Negative for a pneumothorax. IMPRESSION: Vascular congestion or mild interstitial pulmonary edema. Electronically Signed   By: Markus Daft M.D.   On: 03/17/2021 15:01   DG Foot Complete Left  Result Date: 03/17/2021 CLINICAL DATA:  Suspected sepsis.  Foot infection. EXAM: LEFT FOOT - COMPLETE 3+ VIEW COMPARISON:  No prior. FINDINGS: Bandage is noted overlying the foot making evaluation difficult. Diffuse soft tissue swelling. Postsurgical changes left ankle. Hardware intact. Diffuse osteopenia and degenerative change. Questionable subtle erosion at the base of the left fifth metatarsal. No other focal bony abnormalities identified. No evidence of fracture or dislocation. IMPRESSION: 1. Bandage is noted overlying the foot making evaluation difficult. Diffuse soft tissue swelling. Postsurgical changes left ankle. Diffuse osteopenia degenerative change. 2. Questionable subtle erosion at the base of the left fifth metatarsal. Osteomyelitis cannot be completely excluded. Further evaluation with MRI can be obtained as needed. Electronically Signed   By: Marcello Moores  Register   On: 03/17/2021 15:03    Scheduled Meds:  vitamin C  250 mg Oral BID   atorvastatin  20 mg Oral Daily   buPROPion  150 mg Oral BID   cholecalciferol  1,000 Units Oral Daily   enoxaparin (LOVENOX) injection  0.5 mg/kg Subcutaneous Q24H   furosemide  40 mg Oral Daily   gabapentin  300 mg Oral TID   levothyroxine  88 mcg Oral Daily   multivitamin-lutein  1 capsule Oral Daily   mupirocin ointment   Topical Daily   Ensure Max Protein  11 oz Oral BID   sodium chloride flush  3 mL Intravenous Q12H   vitamin B-12  1,000 mcg Oral Once per day on Sun Wed   Continuous Infusions:  sodium chloride     sodium chloride 75 mL/hr at 03/19/21 0606   [START ON 03/20/2021]  ceFAZolin (ANCEF) IV       LOS: 2 days   Time spent: 30 minutes. More than 50% of the time was spent in  counseling/coordination of care  Lorella Nimrod, MD [facility]  If 7PM-7AM, please contact night-coverage [email or web address]  03/19/2021, 2:04 PM   This record has been created using Systems analyst. Errors have been sought and corrected,but may not always be located. Such creation errors do not reflect on the  standard of care.

## 2021-03-19 NOTE — Progress Notes (Signed)
Physical Therapy Treatment Patient Details Name: Christopher [name] MRN: [id number] DOB: [birth year] Today's Date: 03/19/2021    History of Present Illness Christopher [name] is a 67yoM who comes to [facility] with Left foot pain. Per DTR pt has had decline in mobility since ankle ORIF April 2022, recently more confused and with visual hallucination. PMH: morbid obesity, OSA on CPAP, hypoTSH, depression, dCHF, Left ankle fx s/p ORIF. Pt seen by podiatry, feels pt has new wounds related to venous insufficiceny without evidence of surgical infection.  Podiatry recommending actvity as tolerated with post op boot when up mobilizing.  Pt has been working with [facility] for >4 weeks, has not been able to return to any appreciable walking aside from SPT WC to/from recliner. Pt has sustained several falls at home, some involving legs buckling.    PT Comments    PT/Ot co-treat 2/2 for pt's safety and need for +2 assistance. Pt requires +2 assist more so for safety than physical assistance. He was able to stand several times EOB to RW with CAM boot donned. Stood from lowest bed height and was able to advance to taking steps from EOB to recliner. Distance limited by fatigue. Lengthy discussion with pt/pt's daughter about DC disposition and personal goals going forward. Both are is agreement that pt will need extensive PT going forward to return to PLOF. DC recs changed to SNF to maximize pt's PT/OT while assisting pt to maximal independence with ADLs.    Follow Up Recommendations  SNF     Equipment Recommendations  Other (comment) (defer to next level of care)       Precautions / Restrictions Precautions Precautions: Fall Precaution Comments: CAM boot with wt bearing Required Braces or Orthoses: Other Brace (CAM boot) Restrictions Weight Bearing Restrictions: Yes LLE Weight Bearing: Weight bearing as tolerated Other Position/Activity Restrictions: WBAT in cam boot when up    Mobility  Bed Mobility Overal bed  mobility: Needs Assistance Bed Mobility: Supine to Sit     Supine to sit: Min assist;Mod assist;HOB elevated Sit to supine:  (pt was in recliner post session)   General bed mobility comments: increased time    Transfers Overall transfer level: Needs assistance Equipment used: Rolling walker (2 wheeled) Transfers: Sit to/from [facility] Sit to Stand: Min assist;Mod assist;+2 physical assistance Stand pivot transfers: Min assist;Mod assist;+2 safety/equipment;+2 physical assistance      Lateral/Scoot Transfers: Min guard;Supervision (lateral scoot at EOB) General transfer comment: Pt was able to stand 2 x EOB with +2 assist more so for safety. He also was able to stand and take steps to recliner with use on RW + CAM boot. WBAT with cam boot. verbal order recieved for new CAM boot 2/2 to previous one too small.  Ambulation/Gait Ambulation/Gait assistance: Min assist;Mod assist;+2 physical assistance;+2 safety/equipment Gait Distance (Feet): 3 Feet Assistive device: Rolling walker (2 wheeled) (CAM boot) Gait Pattern/deviations: Step-to pattern;Trunk flexed Gait velocity: decreased   General Gait Details: Pt was able to stand from lowest bed height and take steps from EOB to recliner with +2 assist for safety. Pt does fatigue quickly however vitals were stable. no C/O dizziness with standing activity.     Balance Overall balance assessment: Needs assistance Sitting-balance support: Feet supported Sitting balance-Leahy Scale: Good Sitting balance - Comments: Pt sat EOB with supervision only. sat EOB for prolong periods while POC discussed with pt and pt's daughter   Standing balance support: During functional activity;Bilateral upper extremity supported Standing balance-Leahy Scale: Poor Standing balance comment:  requires UE support and external support to sustain short static stand of ~20-30 seconds.      Cognition Arousal/Alertness: Awake/alert Behavior During  Therapy: WFL for tasks assessed/performed;Flat affect Overall Cognitive Status: History of cognitive impairments - at baseline      General Comments: A&O to basics, some memory impairment at baseline. Able to follow all commands.      Exercises Other Exercises Other Exercises: lengthy discussion with pt/OT/pt's daughter about DC dispositioned and plan going forward. Both pt and pt's daughter are interested in rehab to assist pt to PLOF. this time < 1 year ago pt was completely independent with all ADLs.        Pertinent Vitals/Pain Pain Assessment: No/denies pain    Home Living Family/patient expects to be discharged to:: Private residence Living Arrangements: Children;Spouse/significant other (DTR assists pt and his spouse) Available Help at Discharge: Available 24 hours/day Type of Home: House Home Access: Stairs to enter Entrance Stairs-Rails: Right;Left Home Layout: Able to live on main level with bedroom/bathroom Home Equipment: Walker - 2 wheels;Walker - 4 wheels;Wheelchair - [employer];Shower seat - built in;Other (comment) (sleeps in recliner)      Prior Function Level of Independence: Needs assistance  Gait / Transfers Assistance Needed: very limited fxl mobility in the home, sleeps in recliner, uses rollator for mobility, but dtr endorses that patient has mostly only been pivoting to a bari [facility] since May 2022. Only walks when HHPT comes 1x/wk. ADL's / Homemaking Assistance Needed: Pt has been assisted by his daughter for LB bathing and dressing since initial fracture in April 2022. Pt was able to cook/clean and perform other IADLs. In recent months since fracture, has been relying on family assistance for IADLs as well. Comments: pt has been functionally very limited since initial fracture, admits that he has been having to put more weight on it than he was supposed to   PT Goals (current goals can now be found in the care plan section) Acute Rehab PT  Goals Patient Stated Goal: get stronger, be able to walk again Progress towards PT goals: Progressing toward goals    Frequency    Min 2X/week      PT Plan Discharge plan needs to be updated    Co-evaluation   Reason for Co-Treatment: For patient/therapist safety;To address functional/ADL transfers PT goals addressed during session: Mobility/safety with mobility;Balance;Proper use of DME;Strengthening/ROM OT goals addressed during session: Proper use of Adaptive equipment and DME;ADL's and self-care      AM-PAC PT "6 Clicks" Mobility   Outcome Measure  Help needed turning from your back to your side while in a flat bed without using bedrails?: A Lot Help needed moving from lying on your back to sitting on the side of a flat bed without using bedrails?: A Lot Help needed moving to and from a bed to a chair (including a wheelchair)?: A Lot Help needed standing up from a chair using your arms (e.g., wheelchair or bedside chair)?: A Lot Help needed to walk in hospital room?: A Lot Help needed climbing 3-5 steps with a railing? : Total 6 Click Score: 11    End of Session   Activity Tolerance: Patient tolerated treatment well;No increased pain Patient left: in chair;with call bell/phone within reach;with chair alarm set Nurse Communication: Mobility status;Weight bearing status;Precautions PT Visit Diagnosis: Difficulty in walking, not elsewhere classified (R26.2);Other abnormalities of gait and mobility (R26.89);Muscle weakness (generalized) (M62.81);Dizziness and giddiness (R42);History of falling (Z91.81)     Time: [id number] PT  Time Calculation (min) (ACUTE ONLY): 66 min  Charges:  $Therapeutic Activity: 23-37 mins                     Julaine Fusi PTA 03/19/21, 2:04 PM

## 2021-03-19 NOTE — [place] FL2 (Signed)
[place] LEVEL OF CARE SCREENING TOOL     IDENTIFICATION  Patient Name: Christopher [name] Birthdate: 15-Nov-1950 Sex: male Admission Date (Current Location): 03/17/2021  [facility] and [place] Number:  Engineering geologist and Address:  [facility], [address] [place], [place] [address]      Provider Number: [id number]  Attending Physician Name and Address:  Lorella Nimrod, MD  Relative Name and Phone Number:  [name] (daughter) [phone]    Current Level of Care: Hospital Recommended Level of Care: [place] Prior Approval Number:    Date Approved/Denied:   PASRR Number: [id number]  Discharge Plan: SNF    Current Diagnoses: Patient Active Problem List   Diagnosis Date Noted   Osteomyelitis of ankle or foot, acute, left ([place]) 03/17/2021   Acute on chronic respiratory failure ([place]) 03/17/2021   Osteomyelitis ([place]) 12/16/2020   Closed left ankle fracture 12/16/2020   OSA on CPAP 12/16/2020   Morbid obesity with BMI of 50.0-59.9, adult ([place]) 12/16/2020   Hyperlipidemia, mixed 12/16/2020   Depression 12/16/2020   Hypothyroid 12/16/2020   Chronic diastolic CHF (congestive heart failure) ([place]) 12/16/2020    Orientation RESPIRATION BLADDER Height & Weight     Self  O2 (2L nasal cannula) Continent Weight: (!) 330 lb (149.7 kg) Height:  '5\' 11"'$  (180.3 cm)  BEHAVIORAL SYMPTOMS/MOOD NEUROLOGICAL BOWEL NUTRITION STATUS      Continent Diet (see discharge summary)  AMBULATORY STATUS COMMUNICATION OF NEEDS Skin   Extensive Assist Verbally Other (Comment) (pressure injury left ankle and toe ulcer)                       Personal Care Assistance Level of Assistance  Bathing, Feeding, Dressing, Total care Bathing Assistance: Maximum assistance Feeding assistance: Limited assistance Dressing Assistance: Maximum assistance Total Care Assistance: Maximum assistance   Functional Limitations Info  Hearing, Speech, Sight  Sight Info: Impaired Hearing Info: Adequate Speech Info: Adequate    SPECIAL CARE FACTORS FREQUENCY  PT (By licensed PT), OT (By licensed OT)     PT Frequency: min 4x weekly OT Frequency: min 4x weekly            Contractures Contractures Info: Not present    Additional Factors Info  Code Status, Allergies Code Status Info: full Allergies Info: Albuterol, grass extracts, cats           Current Medications (03/19/2021):  This is the current hospital active medication list Current Facility-Administered Medications  Medication Dose Route Frequency Provider Last Rate Last Admin   0.9 %  sodium chloride infusion  250 mL Intravenous PRN Agbata, Tochukwu, MD       0.9 %  sodium chloride infusion   Intravenous Continuous Stegmayer, Kimberly A, PA-C 75 mL/hr at 03/19/21 0606 New Bag at 03/19/21 0606   ascorbic acid (VITAMIN C) tablet 250 mg  250 mg Oral BID Lorella Nimrod, MD   250 mg at 03/19/21 1042   atorvastatin (LIPITOR) tablet 20 mg  20 mg Oral Daily Agbata, Tochukwu, MD   20 mg at 03/19/21 1041   buPROPion (WELLBUTRIN SR) 12 hr tablet 150 mg  150 mg Oral BID Agbata, Tochukwu, MD   150 mg at 03/19/21 1037   [START ON 03/20/2021] ceFAZolin (ANCEF) IVPB 1 g/50 mL premix  1 g Intravenous Once Stegmayer, Kimberly A, PA-C       cholecalciferol (VITAMIN D3) tablet 1,000 Units  1,000 Units Oral Daily Agbata, Tochukwu, MD   1,000  Units at 03/19/21 1041   diphenhydrAMINE (BENADRYL) injection 50 mg  50 mg Intravenous Once PRN Stegmayer, Joelene Millin A, PA-C       enoxaparin (LOVENOX) injection 75 mg  0.5 mg/kg Subcutaneous Q24H Oswald Hillock, RPH   75 mg at 03/18/21 2154   famotidine (PEPCID) tablet 40 mg  40 mg Oral Once PRN Stegmayer, Joelene Millin A, PA-C       furosemide (LASIX) tablet 40 mg  40 mg Oral Daily Agbata, Tochukwu, MD   40 mg at 03/19/21 1041   gabapentin (NEURONTIN) capsule 300 mg  300 mg Oral TID Collier Bullock, MD   300 mg at 03/19/21 1041   HYDROcodone-acetaminophen  (NORCO/VICODIN) 5-325 MG per tablet 1 tablet  1 tablet Oral Q6H PRN Agbata, Tochukwu, MD       HYDROmorphone (DILAUDID) injection 1 mg  1 mg Intravenous Once PRN Stegmayer, Janalyn Harder, PA-C       levothyroxine (SYNTHROID) tablet 88 mcg  88 mcg Oral Daily Agbata, Tochukwu, MD   88 mcg at 03/19/21 [id number]   methylPREDNISolone sodium succinate (SOLU-MEDROL) 125 mg/2 mL injection 125 mg  125 mg Intravenous Once PRN Stegmayer, Kimberly A, PA-C       midazolam (VERSED) 2 MG/ML syrup 8 mg  8 mg Oral Once PRN Stegmayer, Joelene Millin A, PA-C       multivitamin-lutein (OCUVITE-LUTEIN) capsule 1 capsule  1 capsule Oral Daily Lorella Nimrod, MD       mupirocin ointment (BACTROBAN) 2 %   Topical Daily Lorella Nimrod, MD   Given at 03/18/21 1336   ondansetron (ZOFRAN) tablet 4 mg  4 mg Oral Q6H PRN Agbata, Tochukwu, MD       Or   ondansetron (ZOFRAN) injection 4 mg  4 mg Intravenous Q6H PRN Agbata, Tochukwu, MD       protein supplement (ENSURE MAX) liquid  11 oz Oral BID Lorella Nimrod, MD   11 oz at 03/18/21 2155   sodium chloride flush (NS) 0.9 % injection 3 mL  3 mL Intravenous Q12H Agbata, Tochukwu, MD   3 mL at 03/18/21 2154   sodium chloride flush (NS) 0.9 % injection 3 mL  3 mL Intravenous PRN Agbata, Tochukwu, MD       vitamin B-12 (CYANOCOBALAMIN) tablet 1,000 mcg  1,000 mcg Oral Once per day on Sun Wed Agbata, Tochukwu, MD   1,000 mcg at 03/18/21 1000     Discharge Medications: Please see discharge summary for a list of discharge medications.  Relevant Imaging Results:  Relevant Lab Results:   Additional Information SSN: [id number]  Alberteen Sam, LCSW

## 2021-03-19 NOTE — Discharge Summary (Signed)
Physician Discharge Summary  Christopher [name] [id number] DOB: 1950-11-18 DOA: 03/17/2021  PCP: Christopher Aus, MD  Admit date: 03/17/2021 Discharge date: 03/20/2021  Admitted From: Home Disposition: SNF  Recommendations for Outpatient Follow-up:  Follow up with PCP in 1-2 weeks Follow-up with podiatry in 1 week Follow-up with vascular surgery in 1 week Please obtain BMP/CBC in one week Please follow up on the following pending results: None  Home Health: No Equipment/Devices: Rolling walker, CPAP at night, home oxygen Discharge Condition: Stable CODE STATUS: Full Diet recommendation: Heart Healthy / Carb Modified   Brief/Interim Summary: Christopher [name] is a 70 y.o. male with medical history significant for morbid obesity, obstructive sleep apnea on CPAP, hypothyroidism, depression, chronic diastolic dysfunction CHF, status post ORIF for closed left ankle fracture who referred to the emergency room by his primary care provider for evaluation of left foot pain and increased drainage from left foot wound. Patient had an ankle fracture in April and had surgery to his left ankle.  According to the daughter he has had limited mobility since after his surgery. X-ray of left foot shows diffuse soft tissue swelling.  Diffuse osteopenia with degenerative changes and a questionable subtle erosion at the base of left fifth metatarsal. Podiatry was consulted for concern of osteomyelitis.  They does not think he has osteo-, more like superficial macerations, no obvious infection and they are recommending conservative management with better day and dressings and Unna boot wrap. Patient was initially started on ceftriaxone and metronidazole which was discontinued. Podiatry and vascular surgery both wants to do outpatient work-up. PT is recommending SNF and patient and family would like to take advantage of that.  Patient is being discharged to rehab for further management.  He will follow-up with  vascular surgery and podiatry as an outpatient for further work-up and management.  Patient was also found to have acute on chronic hypercapnic respiratory failure on admission requiring BiPAP initially.  Responded well.  Currently saturating well on his baseline oxygen requirement of 2 L.  He will continue using CPAP at night and might get benefit with workout for BiPAP as an outpatient.  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  He will need continuation of counseling and help to lose weight.  Patient has an history of chronic diastolic dysfunction and appears stable.  He will continue home dose of furosemide and metolazone.  Patient will continue rest of his home medications and follow-up with his provider.  Discharge Diagnoses:  Principal Problem:   Osteomyelitis of ankle or foot, acute, left (HCC) Active Problems:   OSA on CPAP   Morbid obesity with BMI of 50.0-59.9, adult (HCC)   Depression   Hypothyroid   Chronic diastolic CHF (congestive heart failure) (HCC)   Acute on chronic respiratory failure [facility])   Discharge Instructions  Discharge Instructions     Diet - low sodium heart healthy   Complete by: As directed    Discharge instructions   Complete by: As directed    It was pleasure taking care of you. Continued doing your dressing change and applying as directed. Follow-up with your foot doctor and vascular surgeon in 1 week.   Discharge wound care:   Complete by: As directed    Paint 3rd toe and webspaces with betadine daily left foot.   Cleanse left malleolus wound with NS and pat dry. Apply Aquacel to  wound bed.  Apply 4 x 4 gauze, ABD, Kerlix from the toes to below the knee and Ace wrap from the toes to below the knee.  Recommend changing every 2 to 3 days.   Moisturize and elevate legs daily.   Increase  activity slowly   Complete by: As directed       Allergies as of 03/19/2021       Reactions   Albuterol Other (See Comments)   Causes afib    Grass Extracts [gramineae Pollens] Other (See Comments)   Sneeze and watery eyes   Other    Other reaction(s): Other (See Comments) Unable to breath when around cats        Medication List     STOP taking these medications    metolazone 2.5 MG tablet Commonly known as: ZAROXOLYN       TAKE these medications    aspirin 81 MG EC tablet Take 81 mg by mouth at bedtime.   atorvastatin 20 MG tablet Commonly known as: LIPITOR Take 20 mg by mouth at bedtime.   buPROPion 150 MG 12 hr tablet Commonly known as: WELLBUTRIN SR Take 150 mg by mouth 2 (two) times daily. After 1st meal of day and after dinner   Cholecalciferol 25 MCG (1000 UT) capsule Take 1,000 Units by mouth at bedtime.   cyanocobalamin 1000 MCG tablet Take 1,000 mcg by mouth 2 (two) times a week. At bedtime. Sundays and Wednesdays   Ensure Max Protein Liqd Take 330 mLs (11 oz total) by mouth 2 (two) times daily.   furosemide 40 MG tablet Commonly known as: LASIX Take 1 tablet (40 mg total) by mouth every other day. After 1st meal of the day   gabapentin 300 MG capsule Commonly known as: NEURONTIN Take 300 mg by mouth 3 (three) times daily. After 1st meal of day, after dinner, and at bedtime   ibuprofen 200 MG tablet Commonly known as: ADVIL Take 400-600 mg by mouth every 6 (six) hours as needed for mild pain or moderate pain.   levalbuterol 45 MCG/ACT inhaler Commonly known as: XOPENEX HFA Inhale 2 puffs into the lungs every 4 (four) hours as needed for wheezing.   levothyroxine 88 MCG tablet Commonly known as: SYNTHROID Take 88 mcg by mouth daily. 30 to 60 minutes before breakfast on an empty stomach and with a glass of water   mupirocin ointment 2 % Commonly known as: BACTROBAN Apply topically daily.   OXYGEN Inhale 2 L into the lungs  continuous.   potassium chloride 10 MEQ tablet Commonly known as: KLOR-CON Take 10 mEq by mouth 2 (two) times daily. After 1st meal of day and after dinner   PreserVision AREDS 2 Caps Take 1 capsule by mouth 2 (two) times daily. With food. After 1st meal of day and after dinner   propranolol 40 MG tablet Commonly known as: INDERAL Take 40 mg by mouth daily. After dinner   trolamine salicylate 10 % cream Commonly known as: ASPERCREME Apply 1 application topically as needed for muscle pain. Apply to knees and feet   vitamin A & D ointment Apply 1 application topically as needed. To groin area.               Discharge Care Instructions  (From admission, onward)           Start     Ordered   03/19/21 0000  Discharge wound care:       Comments: Paint 3rd toe and webspaces  with betadine daily left foot.   Cleanse left malleolus wound with NS and pat dry. Apply Aquacel to wound bed.  Apply 4 x 4 gauze, ABD, Kerlix from the toes to below the knee and Ace wrap from the toes to below the knee.  Recommend changing every 2 to 3 days.   Moisturize and elevate legs daily.   03/19/21 1210            Follow-up Information     Samara Deist, DPM Follow up in 1 week(s).   Specialty: Podiatry Contact information: [place] [address] [phone]         Algernon Huxley, MD. Schedule an appointment as soon as possible for a visit in 1 week(s).   Specialties: Vascular Surgery, Radiology, Interventional Cardiology Contact information: [address] [address] [phone]         Christopher Aus, MD. Schedule an appointment as soon as possible for a visit in 1 week(s).   Specialty: Internal Medicine Contact information: [place] [address] [phone]                Allergies  Allergen Reactions   Albuterol Other (See Comments)    Causes afib    Grass  Extracts [Gramineae Pollens] Other (See Comments)    Sneeze and watery eyes   Other     Other reaction(s): Other (See Comments) Unable to breath when around cats    Consultations: Podiatry Vascular surgery  Procedures/Studies: DG Chest 2 View  Result Date: 03/17/2021 CLINICAL DATA:  Suspected sepsis. EXAM: CHEST - 2 VIEW COMPARISON:  12/16/2020 FINDINGS: Two views of the chest demonstrate mild enlargement of the cardiac silhouette which is similar to the previous examination. Central vascular structures are prominent. No large pleural effusions. Degenerative changes in the thoracic spine. Negative for a pneumothorax. IMPRESSION: Vascular congestion or mild interstitial pulmonary edema. Electronically Signed   By: Markus Daft M.D.   On: 03/17/2021 15:01   DG Foot Complete Left  Result Date: 03/17/2021 CLINICAL DATA:  Suspected sepsis.  Foot infection. EXAM: LEFT FOOT - COMPLETE 3+ VIEW COMPARISON:  No prior. FINDINGS: Bandage is noted overlying the foot making evaluation difficult. Diffuse soft tissue swelling. Postsurgical changes left ankle. Hardware intact. Diffuse osteopenia and degenerative change. Questionable subtle erosion at the base of the left fifth metatarsal. No other focal bony abnormalities identified. No evidence of fracture or dislocation. IMPRESSION: 1. Bandage is noted overlying the foot making evaluation difficult. Diffuse soft tissue swelling. Postsurgical changes left ankle. Diffuse osteopenia degenerative change. 2. Questionable subtle erosion at the base of the left fifth metatarsal. Osteomyelitis cannot be completely excluded. Further evaluation with MRI can be obtained as needed. Electronically Signed   By: Marcello Moores  Register   On: 03/17/2021 15:03    Subjective: Patient was seen and examined today.  No new complaints.  We discussed about having outpatient follow-up with podiatry and vascular surgery for further recommendations as they do not think that he needs any  emergent intervention.  Patient agreed.  Discharge Exam: Vitals:   03/19/21 0808 03/19/21 1112  BP: (!) 90/43 (!) 91/51  Pulse: 75 82  Resp: 20 20  Temp: 97.9 F (36.6 C) 98.2 F (36.8 C)  SpO2: 94% 95%   Vitals:   03/18/21 2214 03/19/21 0224 03/19/21 0808 03/19/21 1112  BP:   (!) 90/43 (!) 91/51  Pulse:  88 75 82  Resp:   20  20  Temp:   97.9 F (36.6 C) 98.2 F (36.8 C)  TempSrc:      SpO2: 94% 93% 94% 95%  Weight:      Height:        General: Pt is alert, awake, not in acute distress Cardiovascular: RRR, S1/S2 +, no rubs, no gallops Respiratory: CTA bilaterally, no wheezing, no rhonchi Abdominal: Soft, NT, ND, bowel sounds + Extremities: Marked lymphedema and venous congestion, severe macerations between left toes, healing wound on medial left malleolus.  The results of significant diagnostics from this hospitalization (including imaging, microbiology, ancillary and laboratory) are listed below for reference.    Microbiology: Recent Results (from the past 240 hour(s))  Culture, blood (Routine x 2)     Status: None (Preliminary result)   Collection Time: 03/17/21  1:21 PM   Specimen: BLOOD  Result Value Ref Range Status   Specimen Description BLOOD RIGTH [facility]  Final   Special Requests   Final    BOTTLES DRAWN AEROBIC AND ANAEROBIC Blood Culture results may not be optimal due to an excessive volume of blood received in culture bottles   Culture   Final    NO GROWTH 2 DAYS Performed at [facility], [address] [place], [place] [address]    Report Status PENDING  Incomplete  Resp Panel by RT-PCR (Flu A&B, Covid) Nasopharyngeal Swab     Status: None   Collection Time: 03/17/21  1:21 PM   Specimen: Nasopharyngeal Swab; Nasopharyngeal(NP) swabs in vial transport medium  Result Value Ref Range Status   SARS Coronavirus 2 by RT PCR NEGATIVE NEGATIVE Final    Comment: (NOTE) SARS-CoV-2 target nucleic acids are NOT DETECTED.  The SARS-CoV-2 RNA is  generally detectable in upper respiratory specimens during the acute phase of infection. The lowest concentration of SARS-CoV-2 viral copies this assay can detect is 138 copies/mL. A negative result does not preclude SARS-Cov-2 infection and should not be used as the sole basis for treatment or other patient management decisions. A negative result may occur with  improper specimen collection/handling, submission of specimen other than nasopharyngeal swab, presence of viral mutation(s) within the areas targeted by this assay, and inadequate number of viral copies(<138 copies/mL). A negative result must be combined with clinical observations, patient history, and epidemiological information. The expected result is Negative.  Fact Sheet for Patients:  EntrepreneurPulse.com.au  Fact Sheet for Healthcare Providers:  IncredibleEmployment.be  This test is no t yet approved or cleared by the [place] FDA and  has been authorized for detection and/or diagnosis of SARS-CoV-2 by FDA under an Emergency Use Authorization (EUA). This EUA will remain  in effect (meaning this test can be used) for the duration of the COVID-19 declaration under Section 564(b)(1) of the Act, 21 U.S.C.section 360bbb-3(b)(1), unless the authorization is terminated  or revoked sooner.       Influenza A by PCR NEGATIVE NEGATIVE Final   Influenza B by PCR NEGATIVE NEGATIVE Final    Comment: (NOTE) The Xpert Xpress SARS-CoV-2/FLU/RSV plus assay is intended as an aid in the diagnosis of influenza from Nasopharyngeal swab specimens and should not be used as a sole basis for treatment. Nasal washings and aspirates are unacceptable for Xpert Xpress SARS-CoV-2/FLU/RSV testing.  Fact Sheet for Patients: EntrepreneurPulse.com.au  Fact Sheet for Healthcare Providers: IncredibleEmployment.be  This test is not yet approved or cleared by the [place] FDA and has been authorized for detection and/or diagnosis of SARS-CoV-2 by FDA under an Emergency Use  Authorization (EUA). This EUA will remain in effect (meaning this test can be used) for the duration of the COVID-19 declaration under Section 564(b)(1) of the Act, 21 U.S.C. section 360bbb-3(b)(1), unless the authorization is terminated or revoked.  Performed at [facility], [address], [place], [place] [address]   Aerobic/Anaerobic Culture w Gram Stain (surgical/deep wound)     Status: None (Preliminary result)   Collection Time: 03/17/21  4:12 PM   Specimen: Wound  Result Value Ref Range Status   Specimen Description   Final    WOUND Performed at [facility], [address] [place], [place] [address]    Special Requests   Final    LEFT FOOT Performed at [facility], [address] [place], [place] [address]    Gram Stain   Final    NO WBC SEEN FEW GRAM VARIABLE ROD RARE GRAM POSITIVE COCCI IN PAIRS    Culture   Final    MODERATE GRAM NEGATIVE RODS CULTURE REINCUBATED FOR BETTER GROWTH Performed at [facility], [place] [address], [place], [place] [address]    Report Status PENDING  Incomplete  Culture, blood (Routine x 2)     Status: None (Preliminary result)   Collection Time: 03/17/21  5:33 PM   Specimen: BLOOD  Result Value Ref Range Status   Specimen Description BLOOD BLOOD LEFT HAND  Final   Special Requests   Final    BOTTLES DRAWN AEROBIC AND ANAEROBIC Blood Culture adequate volume   Culture   Final    NO GROWTH 2 DAYS Performed at [facility], [address] [place], [place] [address]    Report Status PENDING  Incomplete     Labs: BNP (last 3 results) Recent Labs    12/16/20 1547  BNP 123XX123   Basic Metabolic Panel: Recent Labs  Lab 03/17/21 1321 03/18/21 0550  NA 143 143  K 4.9 4.5  CL 95* 96*  CO2 40* 37*  GLUCOSE 114* 87  BUN 31* 29*  CREATININE 1.50* 1.19  CALCIUM 9.4 8.8*    Liver Function Tests: Recent Labs  Lab 03/17/21 1321  AST 11*  ALT 7  ALKPHOS 54  BILITOT 1.1  PROT 7.5  ALBUMIN 3.9   No results for input(s): LIPASE, AMYLASE in the last 168 hours. No results for input(s): AMMONIA in the last 168 hours. CBC: Recent Labs  Lab 03/17/21 1321 03/18/21 0550  WBC 7.6 6.5  NEUTROABS 5.5  --   HGB 14.1 12.1*  HCT 47.8 40.5  MCV 97.6 96.9  PLT 232 186   Cardiac Enzymes: No results for input(s): CKTOTAL, CKMB, CKMBINDEX, TROPONINI in the last 168 hours. BNP: Invalid input(s): POCBNP CBG: No results for input(s): GLUCAP in the last 168 hours. D-Dimer No results for input(s): DDIMER in the last 72 hours. Hgb A1c Recent Labs    03/17/21 1321  HGBA1C 5.0   Lipid Profile No results for input(s): CHOL, HDL, LDLCALC, TRIG, CHOLHDL, LDLDIRECT in the last 72 hours. Thyroid function studies No results for input(s): TSH, T4TOTAL, T3FREE, THYROIDAB in the last 72 hours.  Invalid input(s): FREET3 Anemia work up No results for input(s): VITAMINB12, FOLATE, FERRITIN, TIBC, IRON, RETICCTPCT in the last 72 hours. Urinalysis No results found for: COLORURINE, APPEARANCEUR, LABSPEC, [place], GLUCOSEU, [place], [place], KETONESUR, PROTEINUR, UROBILINOGEN, NITRITE, LEUKOCYTESUR Sepsis Labs Invalid input(s): PROCALCITONIN,  WBC,  LACTICIDVEN Microbiology Recent Results (from the past 240 hour(s))  Culture, blood (Routine x 2)  Status: None (Preliminary result)   Collection Time: 03/17/21  1:21 PM   Specimen: BLOOD  Result Value Ref Range Status   Specimen Description BLOOD RIGTH [facility]  Final   Special Requests   Final    BOTTLES DRAWN AEROBIC AND ANAEROBIC Blood Culture results may not be optimal due to an excessive volume of blood received in culture bottles   Culture   Final    NO GROWTH 2 DAYS Performed at [facility], [address], [place], [place] [address]    Report Status PENDING  Incomplete  Resp Panel by RT-PCR (Flu A&B,  Covid) Nasopharyngeal Swab     Status: None   Collection Time: 03/17/21  1:21 PM   Specimen: Nasopharyngeal Swab; Nasopharyngeal(NP) swabs in vial transport medium  Result Value Ref Range Status   SARS Coronavirus 2 by RT PCR NEGATIVE NEGATIVE Final    Comment: (NOTE) SARS-CoV-2 target nucleic acids are NOT DETECTED.  The SARS-CoV-2 RNA is generally detectable in upper respiratory specimens during the acute phase of infection. The lowest concentration of SARS-CoV-2 viral copies this assay can detect is 138 copies/mL. A negative result does not preclude SARS-Cov-2 infection and should not be used as the sole basis for treatment or other patient management decisions. A negative result may occur with  improper specimen collection/handling, submission of specimen other than nasopharyngeal swab, presence of viral mutation(s) within the areas targeted by this assay, and inadequate number of viral copies(<138 copies/mL). A negative result must be combined with clinical observations, patient history, and epidemiological information. The expected result is Negative.  Fact Sheet for Patients:  EntrepreneurPulse.com.au  Fact Sheet for Healthcare Providers:  IncredibleEmployment.be  This test is no t yet approved or cleared by the [place] FDA and  has been authorized for detection and/or diagnosis of SARS-CoV-2 by FDA under an Emergency Use Authorization (EUA). This EUA will remain  in effect (meaning this test can be used) for the duration of the COVID-19 declaration under Section 564(b)(1) of the Act, 21 U.S.C.section 360bbb-3(b)(1), unless the authorization is terminated  or revoked sooner.       Influenza A by PCR NEGATIVE NEGATIVE Final   Influenza B by PCR NEGATIVE NEGATIVE Final    Comment: (NOTE) The Xpert Xpress SARS-CoV-2/FLU/RSV plus assay is intended as an aid in the diagnosis of influenza from Nasopharyngeal swab specimens and should  not be used as a sole basis for treatment. Nasal washings and aspirates are unacceptable for Xpert Xpress SARS-CoV-2/FLU/RSV testing.  Fact Sheet for Patients: EntrepreneurPulse.com.au  Fact Sheet for Healthcare Providers: IncredibleEmployment.be  This test is not yet approved or cleared by the [place] FDA and has been authorized for detection and/or diagnosis of SARS-CoV-2 by FDA under an Emergency Use Authorization (EUA). This EUA will remain in effect (meaning this test can be used) for the duration of the COVID-19 declaration under Section 564(b)(1) of the Act, 21 U.S.C. section 360bbb-3(b)(1), unless the authorization is terminated or revoked.  Performed at [facility], [address] [place], [place] [address]   Aerobic/Anaerobic Culture w Gram Stain (surgical/deep wound)     Status: None (Preliminary result)   Collection Time: 03/17/21  4:12 PM   Specimen: Wound  Result Value Ref Range Status   Specimen Description   Final    WOUND Performed at [facility] [address], [place], [place] [address]    Special Requests   Final    LEFT FOOT Performed at [facility], [address] [place], [place] [address]    Gram Stain   Final    NO WBC SEEN FEW GRAM VARIABLE ROD RARE GRAM POSITIVE COCCI IN PAIRS    Culture   Final    MODERATE GRAM NEGATIVE RODS CULTURE REINCUBATED FOR BETTER GROWTH Performed at [facility], [place] [address], [place], [place] [address]    Report Status PENDING  Incomplete  Culture, blood (Routine x 2)     Status: None (Preliminary result)   Collection Time: 03/17/21  5:33 PM   Specimen: BLOOD  Result Value Ref Range Status   Specimen Description BLOOD BLOOD LEFT HAND  Final   Special Requests   Final    BOTTLES DRAWN AEROBIC AND ANAEROBIC Blood Culture adequate volume   Culture   Final    NO GROWTH 2 DAYS Performed at [facility], [address], [place], [place] [address]    Report Status PENDING  Incomplete    Time coordinating discharge: Over 30 minutes  SIGNED:  Lorella Nimrod, MD  [facility] 03/19/2021, 12:14 PM  If 7PM-7AM, please contact night-coverage [email or web address]  This record has been created using Systems analyst. Errors have been sought and corrected,but may not always be located. Such creation errors do not reflect on the standard of care.

## 2021-03-20 DIAGNOSIS — M86172 Other acute osteomyelitis, left ankle and foot: Secondary | ICD-10-CM | POA: Diagnosis not present

## 2021-03-20 NOTE — Plan of Care (Signed)
  Problem: Education: Goal: Knowledge of General Education information will improve Description: Including pain rating scale, medication(s)/side effects and non-pharmacologic comfort measures 03/20/2021 1247 by Cristela Blue, RN Outcome: Progressing 03/20/2021 1247 by Cristela Blue, RN Outcome: Progressing   Problem: Health Behavior/Discharge Planning: Goal: Ability to manage health-related needs will improve 03/20/2021 1247 by Cristela Blue, RN Outcome: Progressing 03/20/2021 1247 by Cristela Blue, RN Outcome: Progressing   Problem: Clinical Measurements: Goal: Ability to maintain clinical measurements within normal limits will improve 03/20/2021 1247 by Cristela Blue, RN Outcome: Progressing 03/20/2021 1247 by Cristela Blue, RN Outcome: Progressing Goal: Will remain free from infection 03/20/2021 1247 by Cristela Blue, RN Outcome: Progressing 03/20/2021 1247 by Cristela Blue, RN Outcome: Progressing Goal: Diagnostic test results will improve 03/20/2021 1247 by Cristela Blue, RN Outcome: Progressing 03/20/2021 1247 by Cristela Blue, RN Outcome: Progressing Goal: Respiratory complications will improve 03/20/2021 1247 by Cristela Blue, RN Outcome: Progressing 03/20/2021 1247 by Cristela Blue, RN Outcome: Progressing Goal: Cardiovascular complication will be avoided 03/20/2021 1247 by Cristela Blue, RN Outcome: Progressing 03/20/2021 1247 by Cristela Blue, RN Outcome: Progressing   Problem: Activity: Goal: Risk for activity intolerance will decrease 03/20/2021 1247 by Cristela Blue, RN Outcome: Progressing 03/20/2021 1247 by Cristela Blue, RN Outcome: Progressing   Problem: Nutrition: Goal: Adequate nutrition will be maintained 03/20/2021 1247 by Cristela Blue, RN Outcome: Progressing 03/20/2021 1247 by Cristela Blue, RN Outcome: Progressing   Problem: Coping: Goal: Level of anxiety will decrease 03/20/2021 1247 by Cristela Blue, RN Outcome:  Progressing 03/20/2021 1247 by Cristela Blue, RN Outcome: Progressing   Problem: Elimination: Goal: Will not experience complications related to bowel motility 03/20/2021 1247 by Cristela Blue, RN Outcome: Progressing 03/20/2021 1247 by Cristela Blue, RN Outcome: Progressing Goal: Will not experience complications related to urinary retention 03/20/2021 1247 by Cristela Blue, RN Outcome: Progressing 03/20/2021 1247 by Cristela Blue, RN Outcome: Progressing   Problem: Pain Managment: Goal: General experience of comfort will improve 03/20/2021 1247 by Cristela Blue, RN Outcome: Progressing 03/20/2021 1247 by Cristela Blue, RN Outcome: Progressing   Problem: Safety: Goal: Ability to remain free from injury will improve 03/20/2021 1247 by Cristela Blue, RN Outcome: Progressing 03/20/2021 1247 by Cristela Blue, RN Outcome: Progressing   Problem: Skin Integrity: Goal: Risk for impaired skin integrity will decrease 03/20/2021 1247 by Cristela Blue, RN Outcome: Progressing 03/20/2021 1247 by Cristela Blue, RN Outcome: Progressing

## 2021-03-20 NOTE — Progress Notes (Signed)
This RN provided report to [facility], nurse assuming care of the patient. All outstanding questions resolved. R arm PIV removed. Cannula intact. Pt tolerated well. All belongings packed and in tow. EMS to transport patient to [facility] via stretcher at time of departure.

## 2021-03-20 NOTE — Care Management Important Message (Signed)
Important Message  Patient Details  Name: Christopher [name] MRN: [id number] Date of Birth: 1950/12/28   Medicare Important Message Given:  N/A - LOS <3 / Initial given by admissions     Juliann Pulse A [name] 03/20/2021, 10:47 AM

## 2021-03-20 NOTE — TOC Progression Note (Signed)
Transition of Care [facility]) - Progression Note    Patient Details  Name: Christopher [name] MRN: [id number] Date of Birth: 10-22-1950  Transition of [facility] CM/SW [place],  Phone Number: 03/20/2021, 9:28 AM  Clinical Narrative:     CSW spoke with patient daughter Christopher [name] and his spouse regarding bed offers. At this time only bed offer is [facility], barriers include staffing needs with patient's weight. They expressed understanding.   CSW has started [employer] authorization, pending insurance auth at this time.   Expected Discharge Plan: Skilled Nursing Facility Barriers to Discharge: SNF Pending bed offer  Expected Discharge Plan and Services Expected Discharge Plan: [place] Choice: [place] arrangements for the past 2 months: Single Family Home Expected Discharge Date: 03/19/21                                     Social Determinants of Health (SDOH) Interventions    Readmission Risk Interventions No flowsheet data found.

## 2021-03-20 NOTE — TOC Transition Note (Signed)
Transition of Care [facility]) - CM/SW Discharge Note   Patient Details  Name: Christopher [name] MRN: [id number] Date of Birth: 17-Jul-1951  [facility] CM/SW Contact:  Christopher Sam, LCSW Phone Number: 03/20/2021, 2:47 PM   Clinical Narrative:     Patient will DC to: [place] date: 03/20/21 Family notified: daughter Research officer, trade union byJohnanna [name]  Per MD patient ready for DC to [facility]. RN, patient, patient's family, and facility notified of DC. Discharge Summary sent to facility. RN given number for report   [phone] Room 35B. DC packet on chart. Ambulance transport requested for patient.  CSW signing off.  Christopher Riffle, LCSW    Final next level of care: Skilled Nursing Facility Barriers to Discharge: No Barriers Identified   Patient Goals and CMS Choice Patient states their goals for this hospitalization and ongoing recovery are:: to go to SNF CMS Medicare.gov Compare Post Acute Care list provided to:: Patient Represenative (must comment) (daughter Christopher [name]) Choice offered to / list presented to : Adult Children  Discharge Placement                Patient to be transferred to facility by: [facility] Name of family member notified: daughter Christopher [name] Patient and family notified of of transfer: 03/20/21  Discharge Plan and Services     Post Acute Care Choice: [place]                               Social Determinants of Health (SDOH) Interventions     Readmission Risk Interventions No flowsheet data found.

## 2021-03-22 ENCOUNTER — Other Ambulatory Visit: Payer: Self-pay

## 2021-03-22 ENCOUNTER — Emergency Department: Payer: Medicare Other

## 2021-03-22 ENCOUNTER — Inpatient Hospital Stay
Admission: EM | Admit: 2021-03-22 | Discharge: 2021-03-24 | DRG: 190 | Disposition: A | Discharge status: Skilled Nursing Facility | Payer: Medicare Other | Source: Skilled Nursing Facility | Attending: Internal Medicine | Admitting: Internal Medicine

## 2021-03-22 DIAGNOSIS — E785 Hyperlipidemia, unspecified: Secondary | ICD-10-CM | POA: Diagnosis present

## 2021-03-22 DIAGNOSIS — I5032 Chronic diastolic (congestive) heart failure: Secondary | ICD-10-CM | POA: Diagnosis present

## 2021-03-22 DIAGNOSIS — E039 Hypothyroidism, unspecified: Secondary | ICD-10-CM | POA: Diagnosis present

## 2021-03-22 DIAGNOSIS — Z8249 Family history of ischemic heart disease and other diseases of the circulatory system: Secondary | ICD-10-CM

## 2021-03-22 DIAGNOSIS — I5033 Acute on chronic diastolic (congestive) heart failure: Secondary | ICD-10-CM | POA: Diagnosis present

## 2021-03-22 DIAGNOSIS — E662 Morbid (severe) obesity with alveolar hypoventilation: Secondary | ICD-10-CM | POA: Diagnosis present

## 2021-03-22 DIAGNOSIS — Z9989 Dependence on other enabling machines and devices: Secondary | ICD-10-CM

## 2021-03-22 DIAGNOSIS — N1832 Chronic kidney disease, stage 3b: Secondary | ICD-10-CM | POA: Diagnosis present

## 2021-03-22 DIAGNOSIS — L89326 Pressure-induced deep tissue damage of left buttock: Secondary | ICD-10-CM | POA: Diagnosis present

## 2021-03-22 DIAGNOSIS — M86172 Other acute osteomyelitis, left ankle and foot: Secondary | ICD-10-CM | POA: Diagnosis not present

## 2021-03-22 DIAGNOSIS — G4733 Obstructive sleep apnea (adult) (pediatric): Secondary | ICD-10-CM

## 2021-03-22 DIAGNOSIS — N1831 Chronic kidney disease, stage 3a: Secondary | ICD-10-CM | POA: Diagnosis present

## 2021-03-22 DIAGNOSIS — Z23 Encounter for immunization: Secondary | ICD-10-CM

## 2021-03-22 DIAGNOSIS — Z6841 Body Mass Index (BMI) 40.0 and over, adult: Secondary | ICD-10-CM

## 2021-03-22 DIAGNOSIS — J441 Chronic obstructive pulmonary disease with (acute) exacerbation: Principal | ICD-10-CM | POA: Diagnosis present

## 2021-03-22 DIAGNOSIS — J9621 Acute and chronic respiratory failure with hypoxia: Secondary | ICD-10-CM | POA: Diagnosis present

## 2021-03-22 DIAGNOSIS — Z20822 Contact with and (suspected) exposure to covid-19: Secondary | ICD-10-CM | POA: Diagnosis present

## 2021-03-22 DIAGNOSIS — G4730 Sleep apnea, unspecified: Secondary | ICD-10-CM

## 2021-03-22 DIAGNOSIS — F32A Depression, unspecified: Secondary | ICD-10-CM | POA: Diagnosis present

## 2021-03-22 DIAGNOSIS — G9341 Metabolic encephalopathy: Secondary | ICD-10-CM | POA: Diagnosis present

## 2021-03-22 DIAGNOSIS — J9622 Acute and chronic respiratory failure with hypercapnia: Secondary | ICD-10-CM | POA: Diagnosis present

## 2021-03-22 DIAGNOSIS — N183 Chronic kidney disease, stage 3 unspecified: Secondary | ICD-10-CM | POA: Diagnosis present

## 2021-03-22 DIAGNOSIS — D72829 Elevated white blood cell count, unspecified: Secondary | ICD-10-CM | POA: Diagnosis present

## 2021-03-22 DIAGNOSIS — R778 Other specified abnormalities of plasma proteins: Secondary | ICD-10-CM | POA: Diagnosis present

## 2021-03-22 DIAGNOSIS — Z888 Allergy status to other drugs, medicaments and biological substances status: Secondary | ICD-10-CM | POA: Diagnosis not present

## 2021-03-22 DIAGNOSIS — L89896 Pressure-induced deep tissue damage of other site: Secondary | ICD-10-CM | POA: Diagnosis present

## 2021-03-22 DIAGNOSIS — L89316 Pressure-induced deep tissue damage of right buttock: Secondary | ICD-10-CM | POA: Diagnosis present

## 2021-03-22 DIAGNOSIS — R7989 Other specified abnormal findings of blood chemistry: Secondary | ICD-10-CM | POA: Diagnosis present

## 2021-03-22 DIAGNOSIS — S91302A Unspecified open wound, left foot, initial encounter: Secondary | ICD-10-CM | POA: Diagnosis present

## 2021-03-22 LAB — CBC WITH DIFFERENTIAL/PLATELET
Abs Immature Granulocytes: 0.07 10*3/uL (ref 0.00–0.07)
Basophils Absolute: 0.1 10*3/uL (ref 0.0–0.1)
Basophils Relative: 1 %
Eosinophils Absolute: 0.5 10*3/uL (ref 0.0–0.5)
Eosinophils Relative: 3 %
HCT: 47.2 % (ref 39.0–52.0)
Hemoglobin: 14.1 g/dL (ref 13.0–17.0)
Immature Granulocytes: 1 %
Lymphocytes Relative: 6 %
Lymphs Abs: 0.9 10*3/uL (ref 0.7–4.0)
MCH: 28.6 pg (ref 26.0–34.0)
MCHC: 29.9 g/dL — ABNORMAL LOW (ref 30.0–36.0)
MCV: 95.7 fL (ref 80.0–100.0)
Monocytes Absolute: 1.2 10*3/uL — ABNORMAL HIGH (ref 0.1–1.0)
Monocytes Relative: 9 %
Neutro Abs: 11.6 10*3/uL — ABNORMAL HIGH (ref 1.7–7.7)
Neutrophils Relative %: 80 %
Platelets: 201 10*3/uL (ref 150–400)
RBC: 4.93 MIL/uL (ref 4.22–5.81)
RDW: 15.3 % (ref 11.5–15.5)
WBC: 14.3 10*3/uL — ABNORMAL HIGH (ref 4.0–10.5)
nRBC: 0 % (ref 0.0–0.2)

## 2021-03-22 LAB — BASIC METABOLIC PANEL
Anion gap: 7 (ref 5–15)
BUN: 21 mg/dL (ref 8–23)
CO2: 39 mmol/L — ABNORMAL HIGH (ref 22–32)
Calcium: 9.2 mg/dL (ref 8.9–10.3)
Chloride: 93 mmol/L — ABNORMAL LOW (ref 98–111)
Creatinine, Ser: 1.48 mg/dL — ABNORMAL HIGH (ref 0.61–1.24)
GFR, Estimated: 51 mL/min — ABNORMAL LOW (ref >60)
Glucose, Bld: 121 mg/dL — ABNORMAL HIGH (ref 70–99)
Potassium: 4.7 mmol/L (ref 3.5–5.1)
Sodium: 139 mmol/L (ref 135–145)

## 2021-03-22 LAB — RESP PANEL BY RT-PCR (FLU A&B, COVID) ARPGX2
Influenza A by PCR: NEGATIVE
Influenza B by PCR: NEGATIVE
SARS Coronavirus 2 by RT PCR: NEGATIVE

## 2021-03-22 LAB — TROPONIN I (HIGH SENSITIVITY)
Troponin I (High Sensitivity): 21 ng/L — ABNORMAL HIGH (ref <18)
Troponin I (High Sensitivity): 17 ng/L (ref <18)
Troponin I (High Sensitivity): 14 ng/L (ref <18)

## 2021-03-22 LAB — BLOOD GAS, VENOUS
Acid-Base Excess: 18.5 mmol/L — ABNORMAL HIGH (ref 0.0–2.0)
Bicarbonate: 49.1 mmol/L — ABNORMAL HIGH (ref 20.0–28.0)
O2 Saturation: 58.7 %
Patient temperature: 37
pCO2, Ven: 87 mmHg (ref 44.0–60.0)
pH, Ven: 7.36 (ref 7.250–7.430)
pO2, Ven: 32 mmHg (ref 32.0–45.0)

## 2021-03-22 LAB — BRAIN NATRIURETIC PEPTIDE: B Natriuretic Peptide: 242.7 pg/mL — ABNORMAL HIGH (ref 0.0–100.0)

## 2021-03-22 LAB — LACTIC ACID, PLASMA: Lactic Acid, Venous: 1.1 mmol/L (ref 0.5–1.9)

## 2021-03-22 MED ORDER — PROPRANOLOL HCL 20 MG PO TABS: 40.0000 mg | ORAL_TABLET | Freq: Every day | ORAL | Status: DC

## 2021-03-22 MED ORDER — VITAMIN D3 25 MCG (1000 UNIT) PO TABS
1000.0000 [IU] | ORAL_TABLET | Freq: Every day | ORAL | Status: DC
  Administered 2021-03-22 – 2021-03-23 (×2): 1000 [IU] via ORAL
  Filled 2021-03-22 (×5): qty 1

## 2021-03-22 MED ORDER — GABAPENTIN 300 MG PO CAPS
300.0000 mg | ORAL_CAPSULE | Freq: Three times a day (TID) | ORAL | Status: DC
  Administered 2021-03-23 – 2021-03-24 (×4): 300 mg via ORAL
  Filled 2021-03-22 (×5): qty 1

## 2021-03-22 MED ORDER — ENOXAPARIN SODIUM 40 MG/0.4ML IJ SOSY: 40.0000 mg | PREFILLED_SYRINGE | INTRAMUSCULAR | Status: DC

## 2021-03-22 MED ORDER — HYDROCERIN EX CREA
TOPICAL_CREAM | Freq: Two times a day (BID) | CUTANEOUS | Status: DC
  Administered 2021-03-22: 1 via TOPICAL
  Filled 2021-03-22: qty 113

## 2021-03-22 MED ORDER — ASPIRIN EC 81 MG PO TBEC
81.0000 mg | DELAYED_RELEASE_TABLET | Freq: Every day | ORAL | Status: DC
  Administered 2021-03-22 – 2021-03-23 (×2): 81 mg via ORAL
  Filled 2021-03-22 (×2): qty 1

## 2021-03-22 MED ORDER — ONDANSETRON HCL 4 MG/2ML IJ SOLN: 4.0000 mg | Freq: Three times a day (TID) | INTRAMUSCULAR | Status: DC | PRN

## 2021-03-22 MED ORDER — ENOXAPARIN SODIUM 80 MG/0.8ML IJ SOSY
80.0000 mg | PREFILLED_SYRINGE | INTRAMUSCULAR | Status: DC
  Administered 2021-03-22 – 2021-03-23 (×2): 80 mg via SUBCUTANEOUS
  Filled 2021-03-22 (×3): qty 0.8

## 2021-03-22 MED ORDER — LEVOTHYROXINE SODIUM 88 MCG PO TABS
88.0000 ug | ORAL_TABLET | Freq: Every day | ORAL | Status: DC
  Administered 2021-03-23 – 2021-03-24 (×2): 88 ug via ORAL
  Filled 2021-03-22 (×3): qty 1

## 2021-03-22 MED ORDER — LEVALBUTEROL TARTRATE 45 MCG/ACT IN AERO: 2.0000 | INHALATION_SPRAY | RESPIRATORY_TRACT | Status: DC | PRN

## 2021-03-22 MED ORDER — ACETAMINOPHEN 325 MG PO TABS: 650.0000 mg | ORAL_TABLET | Freq: Four times a day (QID) | ORAL | Status: DC | PRN

## 2021-03-22 MED ORDER — LEVALBUTEROL HCL 0.63 MG/3ML IN NEBU: 0.6300 mg | INHALATION_SOLUTION | Freq: Four times a day (QID) | RESPIRATORY_TRACT | Status: DC | PRN

## 2021-03-22 MED ORDER — VITAMIN B-12 1000 MCG PO TABS
1000.0000 ug | ORAL_TABLET | ORAL | Status: DC
  Administered 2021-03-23: 1000 ug via ORAL
  Filled 2021-03-22: qty 1

## 2021-03-22 MED ORDER — OCUVITE-LUTEIN PO CAPS
1.0000 | ORAL_CAPSULE | Freq: Two times a day (BID) | ORAL | Status: DC
  Administered 2021-03-22 – 2021-03-24 (×4): 1 via ORAL
  Filled 2021-03-22 (×6): qty 1

## 2021-03-22 MED ORDER — BUPROPION HCL ER (SR) 150 MG PO TB12
150.0000 mg | ORAL_TABLET | Freq: Two times a day (BID) | ORAL | Status: DC
  Administered 2021-03-23 – 2021-03-24 (×3): 150 mg via ORAL
  Filled 2021-03-22 (×6): qty 1

## 2021-03-22 MED ORDER — IPRATROPIUM BROMIDE 0.02 % IN SOLN
0.5000 mg | RESPIRATORY_TRACT | Status: DC
  Administered 2021-03-22 – 2021-03-23 (×6): 0.5 mg via RESPIRATORY_TRACT
  Filled 2021-03-22 (×6): qty 2.5

## 2021-03-22 MED ORDER — DM-GUAIFENESIN ER 30-600 MG PO TB12: 1.0000 | ORAL_TABLET | Freq: Two times a day (BID) | ORAL | Status: DC | PRN

## 2021-03-22 MED ORDER — FUROSEMIDE 10 MG/ML IJ SOLN
40.0000 mg | Freq: Two times a day (BID) | INTRAMUSCULAR | Status: DC
  Administered 2021-03-22 – 2021-03-23 (×2): 40 mg via INTRAVENOUS
  Filled 2021-03-22 (×2): qty 4

## 2021-03-22 MED ORDER — ATORVASTATIN CALCIUM 20 MG PO TABS
20.0000 mg | ORAL_TABLET | Freq: Every day | ORAL | Status: DC
  Administered 2021-03-22 – 2021-03-23 (×2): 20 mg via ORAL
  Filled 2021-03-22 (×2): qty 1

## 2021-03-22 MED ORDER — PROPRANOLOL HCL 40 MG PO TABS
40.0000 mg | ORAL_TABLET | Freq: Every day | ORAL | Status: DC
  Administered 2021-03-23: 40 mg via ORAL
  Filled 2021-03-22 (×3): qty 1

## 2021-03-22 MED ORDER — GABAPENTIN 300 MG PO CAPS: 300.0000 mg | ORAL_CAPSULE | Freq: Three times a day (TID) | ORAL | Status: DC

## 2021-03-22 MED ORDER — ENSURE MAX PROTEIN PO LIQD
11.0000 [oz_av] | Freq: Two times a day (BID) | ORAL | Status: DC
  Administered 2021-03-22 – 2021-03-23 (×3): 11 [oz_av] via ORAL
  Filled 2021-03-22: qty 330

## 2021-03-22 MED ORDER — TROLAMINE SALICYLATE 10 % EX CREA
1.0000 "application " | TOPICAL_CREAM | CUTANEOUS | Status: DC | PRN
  Filled 2021-03-22: qty 85

## 2021-03-22 MED ORDER — FUROSEMIDE 40 MG PO TABS
40.0000 mg | ORAL_TABLET | ORAL | Status: DC
  Administered 2021-03-24: 40 mg via ORAL
  Filled 2021-03-22: qty 1

## 2021-03-22 MED ORDER — ENOXAPARIN SODIUM 80 MG/0.8ML IJ SOSY
0.5000 mg/kg | PREFILLED_SYRINGE | INTRAMUSCULAR | Status: DC
  Filled 2021-03-22: qty 0.82

## 2021-03-22 MED ORDER — MUPIROCIN 2 % EX OINT
TOPICAL_OINTMENT | Freq: Every day | CUTANEOUS | Status: DC
  Filled 2021-03-22: qty 22

## 2021-03-22 NOTE — Progress Notes (Signed)
PHARMACIST - PHYSICIAN COMMUNICATION  CONCERNING:  Enoxaparin (Lovenox) for DVT Prophylaxis    RECOMMENDATION: Patient was prescribed enoxaprin '40mg'$  q24 hours for VTE prophylaxis.   Filed Weights   03/22/21 1003  Weight: (!) 163.3 kg (360 lb)    Body mass index is 50.21 kg/m.  Estimated Creatinine Clearance: 72.6 mL/min (A) (by C-G formula based on SCr of 1.48 mg/dL (H)).   Based on [place] patient is candidate for enoxaparin 0.'5mg'$ /kg TBW SQ every 24 hours based on BMI being >30.  DESCRIPTION: Pharmacy has adjusted enoxaparin dose per [facility] policy.  Patient is now receiving enoxaparin 82.5 mg every 24 hours    Berta Minor, PharmD Clinical Pharmacist  03/22/2021 2:55 PM

## 2021-03-22 NOTE — ED Notes (Signed)
Both leg wounds rewrapped and legs elevated with pillows

## 2021-03-22 NOTE — ED Notes (Signed)
Dr Jessup at bedside 

## 2021-03-22 NOTE — ED Triage Notes (Signed)
Pt arrives via EMS from [facility] after they found him this AM with low O2 sats and AMS- pt was off his O2 when they found him- pt was 79% RA, EMS placed him on NRB and got him to 98%- pt still confused and states he does not know why he is here- per EMS pt also had fever

## 2021-03-22 NOTE — ED Notes (Signed)
Pt L foot rewrapped after Dr took off wrapping to photograph wound

## 2021-03-22 NOTE — H&P (Signed)
History and Physical    Christopher [name] [id number] DOB: [birth year] DOA: 03/22/2021  Referring MD/NP/PA:   PCP: Rusty Aus, MD   Patient coming from:  The patient is coming from SNF.    Chief Complaint: Shortness of breath, altered mental status  HPI: Christopher [name] is a 69 y.o. male with medical history significant of hyperlipidemia, hypothyroidism, depression with anxiety, obesity, OSA on CPAP, dCHF, CKD stage IIIa, status post ORIF for closed left ankle fracture, who presents with shortness of breath and altered mental status.  Patient was recently hospitalized from 7/26-7/29 due to left ankle osteomyelitis. Pt is status post ORIF for closed left ankle fracture. Per discharge summary, podiatry was consulted.  They did not think he had osteomyelitis, more like superficial macerations, no obvious infection and they are recommending conservative management. Patient was initially started on ceftriaxone and metronidazole which was discontinued. Podiatry and vascular surgery both wants to do outpatient work-up. Pt was discharged to SNF for further rehab.  Per her daughter at bedside, patient normally is alert orientated x3.  He is confused today.  He has shortness of breath, no cough abdominal pain.  No fever or chills.  Patient normally uses 2 L oxygen, but was found to have oxygen desaturating to 79% on room air, nonrebreather is placed, oxygen is back to 98%, but the patient is still confused.  He moves all extremities normally. No facial droop or slurred speech. Patient does not have active nausea, vomiting or diarrhea. Does not seem to have abdominal pain.  Not sure if patient has any chest pain.  VBG with pH 7.36, CO2 87 and O2 58.  BiPAP is started in ED.  ED Course: pt was found to have WBC 14.3, troponin 21, negative COVID PCR, stable renal function, temperature normal, blood pressure 103/82, heart rate 91, RR 23.  Chest x-ray showed cardiomegaly and negative for effusion.  Patient is  admitted to progressive bed as inpatient.  Review of Systems: Could not be reviewed due to altered mental status  Allergy:  Allergies  Allergen Reactions   Albuterol Other (See Comments)    Causes afib    Grass Extracts [Gramineae Pollens] Other (See Comments)    Sneeze and watery eyes   Other     Other reaction(s): Other (See Comments) Unable to breath when around cats    Past Medical History:  Diagnosis Date   Anxiety    CHF (congestive heart failure) ([place])    Depression    Dysrhythmia    Hypothyroidism    Sleep apnea     Past Surgical History:  Procedure Laterality Date   FRACTURE SURGERY     ORIF ANKLE FRACTURE Left 12/17/2020   Procedure: OPEN REDUCTION INTERNAL FIXATION (ORIF) ANKLE FRACTURE;  Surgeon: Samara Deist, DPM;  Location: [facility] ORS;  Service: Podiatry;  Laterality: Left;    Social History:  reports that he has never smoked. He has never used smokeless tobacco. He reports previous alcohol use. He reports that he does not use drugs.  Family History:  Family History  Problem Relation Age of Onset   Hypertension Mother      Prior to Admission medications   Medication Sig Start Date End Date Taking? Authorizing Provider  aspirin 81 MG EC tablet Take 81 mg by mouth at bedtime.   Yes [provider]  atorvastatin (LIPITOR) 20 MG tablet Take 20 mg by mouth at bedtime. 10/06/20  Yes [provider]  buPROPion (WELLBUTRIN SR) 150 MG 12 hr  tablet Take 150 mg by mouth 2 (two) times daily. After 1st meal of day and after dinner 12/02/20  Yes [provider]  Cholecalciferol 25 MCG (1000 UT) capsule Take 1,000 Units by mouth at bedtime.   Yes [provider]  cyanocobalamin 1000 MCG tablet Take 1,000 mcg by mouth 2 (two) times a week. At bedtime. Sundays and Wednesdays   Yes [provider]  furosemide (LASIX) 40 MG tablet Take 1 tablet (40 mg total) by mouth every other day. After 1st meal of the day 03/19/21  Yes Lorella Nimrod, MD  gabapentin (NEURONTIN) 300 MG capsule Take 300 mg by mouth 3 (three) times daily. After 1st meal of day, after dinner, and at bedtime 04/07/20  Yes [provider]  levothyroxine (SYNTHROID) 88 MCG tablet Take 88 mcg by mouth daily. 30 to 60 minutes before breakfast on an empty stomach and with a glass of water 12/02/20  Yes [provider]  Multiple Vitamins-Minerals (PRESERVISION AREDS 2) CAPS Take 1 capsule by mouth 2 (two) times daily. With food. After 1st meal of day and after dinner   Yes [provider]  potassium chloride (KLOR-CON) 10 MEQ tablet Take 10 mEq by mouth 2 (two) times daily. After 1st meal of day and after dinner 10/21/20  Yes [provider]  propranolol (INDERAL) 40 MG tablet Take 40 mg by mouth daily. After dinner 01/01/21  Yes [provider]  Ensure Max Protein (ENSURE MAX PROTEIN) LIQD Take 330 mLs (11 oz total) by mouth 2 (two) times daily. 03/19/21   Lorella Nimrod, MD  ibuprofen (ADVIL) 200 MG tablet Take 400-600 mg by mouth every 6 (six) hours as needed for mild pain or moderate pain.    [provider]  levalbuterol Penne Lash HFA) 45 MCG/ACT inhaler Inhale 2 puffs into the lungs every 4 (four) hours as needed for wheezing.    [provider]  mupirocin ointment (BACTROBAN) 2 % Apply topically daily. 03/19/21   Lorella Nimrod, MD  OXYGEN Inhale 2 L into the lungs continuous.    [provider]  trolamine salicylate (ASPERCREME) 10 % cream Apply 1 application topically as needed for muscle pain. Apply to knees and feet    [provider]  Vitamins A & D (VITAMIN A & D) ointment Apply 1 application topically as needed. To groin area.    [provider]    Physical Exam: Vitals:   03/22/21 1430 03/22/21 1600 03/22/21 1614 03/22/21 1728  BP: 93/80 (!) 93/51  102/75  Pulse: 72 69  72  Resp: '18 17  18  '$ Temp:    98.6 F (37 C)  TempSrc:    Oral  SpO2: 100% 100% 98% 99%   Weight:      Height:       General: Not in acute distress HEENT:       Eyes: PERRL, EOMI, no scleral icterus.       ENT: No discharge from the ears and nose       Neck: Difficult to assess JVD due to morbid obesity, no bruit, no mass felt. Heme: No neck lymph node enlargement. Cardiac: S1/S2, RRR, No murmurs, No gallops or rubs. Respiratory: No rales, wheezing, rhonchi or rubs. GI: Soft, nondistended, nontender, no organomegaly, BS present. GU: No hematuria Ext: 1+pitting leg edema bilaterally.  Has chronic venous insufficiency change in both legs.  1+DP/PT pulse bilaterally. Musculoskeletal: No joint deformities, No joint redness or warmth, no limitation of ROM in spin.  Skin: has has small wounds in left ankle and toes, no active drainage.         Neuro: Confused, recognizes her daughter, not orientated to place and time, not following command, Cranial nerves II-XII grossly intact, moves all extremities normally.  Psych: Patient is not psychotic  Labs on Admission: I have personally reviewed following labs and imaging studies  CBC: Recent Labs  Lab 03/17/21 1321 03/18/21 0550 03/22/21 1008  WBC 7.6 6.5 14.3*  NEUTROABS 5.5  --  11.6*  HGB 14.1 12.1* 14.1  HCT 47.8 40.5 47.2  MCV 97.6 96.9 95.7  PLT 232 186 [id number]   Basic Metabolic Panel: Recent Labs  Lab 03/17/21 1321 03/18/21 0550 03/22/21 1008  NA 143 143 139  K 4.9 4.5 4.7  CL 95* 96* 93*  CO2 40* 37* 39*  GLUCOSE 114* 87 121*  BUN 31* 29* 21  CREATININE 1.50* 1.19 1.48*  CALCIUM 9.4 8.8* 9.2   GFR: Estimated Creatinine Clearance: 72.6 mL/min (A) (by C-G formula based on SCr of 1.48 mg/dL (H)). Liver Function Tests: Recent Labs  Lab 03/17/21 1321  AST 11*  ALT 7  ALKPHOS 54  BILITOT 1.1  PROT 7.5  ALBUMIN 3.9   No results for input(s): LIPASE, AMYLASE in the last 168 hours. No results for input(s): AMMONIA in the last 168 hours. Coagulation Profile: Recent Labs  Lab 03/17/21 1321  INR 1.1    Cardiac Enzymes: No results for input(s): CKTOTAL, CKMB, CKMBINDEX, TROPONINI in the last 168 hours. BNP (last 3 results) No results for input(s): PROBNP in the last 8760 hours. HbA1C: No results for input(s): HGBA1C in the last 72 hours. CBG: No results for input(s): GLUCAP in the last 168 hours. Lipid Profile: No results for input(s): CHOL, HDL, LDLCALC, TRIG, CHOLHDL, LDLDIRECT in the last 72 hours. Thyroid Function Tests: No results for input(s): TSH, T4TOTAL, FREET4, T3FREE, THYROIDAB in the last 72 hours. Anemia Panel: No results for input(s): VITAMINB12, FOLATE, FERRITIN, TIBC, IRON, RETICCTPCT in the last 72 hours. Urine analysis: No results found for: COLORURINE, APPEARANCEUR, LABSPEC, [place], GLUCOSEU, HGBUR, BILIRUBINUR, KETONESUR, PROTEINUR, UROBILINOGEN, NITRITE, LEUKOCYTESUR Sepsis Labs: '@LABRCNTIP'$ (procalcitonin:4,lacticidven:4) ) Recent Results (from the past 240 hour(s))  Culture, blood (Routine x 2)     Status: None (Preliminary result)   Collection Time: 03/17/21  1:21 PM   Specimen: BLOOD  Result Value Ref Range Status   Specimen Description BLOOD RIGTH [facility]  Final   Special Requests   Final    BOTTLES DRAWN AEROBIC AND ANAEROBIC Blood Culture results may not be optimal due to an excessive volume of blood received in culture bottles   Culture   Final    NO GROWTH 3 DAYS Performed at [facility], [address] [place],  [address]    Report Status PENDING  Incomplete  Resp Panel by RT-PCR (Flu A&B, Covid) Nasopharyngeal Swab     Status: None   Collection Time: 03/17/21  1:21 PM   Specimen: Nasopharyngeal Swab; Nasopharyngeal(NP) swabs in vial transport medium  Result Value Ref Range Status   SARS Coronavirus 2 by RT PCR NEGATIVE NEGATIVE Final    Comment: (NOTE) SARS-CoV-2 target nucleic acids are NOT DETECTED.  The SARS-CoV-2 RNA is generally detectable in upper respiratory specimens during the acute phase of infection. The  lowest concentration of SARS-CoV-2 viral copies this assay can detect is 138 copies/mL. A negative result does not preclude SARS-Cov-2 infection and should not be used as the sole basis for treatment or other  patient management decisions. A negative result may occur with  improper specimen collection/handling, submission of specimen other than nasopharyngeal swab, presence of viral mutation(s) within the areas targeted by this assay, and inadequate number of viral copies(<138 copies/mL). A negative result must be combined with clinical observations, patient history, and epidemiological information. The expected result is Negative.  Fact Sheet for Patients:  EntrepreneurPulse.com.au  Fact Sheet for Healthcare Providers:  IncredibleEmployment.be  This test is no t yet approved or cleared by the [place] FDA and  has been authorized for detection and/or diagnosis of SARS-CoV-2 by FDA under an Emergency Use Authorization (EUA). This EUA will remain  in effect (meaning this test can be used) for the duration of the COVID-19 declaration under Section 564(b)(1) of the Act, 21 U.S.C.section 360bbb-3(b)(1), unless the authorization is terminated  or revoked sooner.       Influenza A by PCR NEGATIVE NEGATIVE Final   Influenza B by PCR NEGATIVE NEGATIVE Final    Comment: (NOTE) The Xpert Xpress SARS-CoV-2/FLU/RSV plus assay is intended as an aid in the diagnosis of influenza from Nasopharyngeal swab specimens and should not be used as a sole basis for treatment. Nasal washings and aspirates are unacceptable for Xpert Xpress SARS-CoV-2/FLU/RSV testing.  Fact Sheet for Patients: EntrepreneurPulse.com.au  Fact Sheet for Healthcare Providers: IncredibleEmployment.be  This test is not yet approved or cleared by the [place] FDA and has been authorized for detection and/or diagnosis of SARS-CoV-2 by FDA under  an Emergency Use Authorization (EUA). This EUA will remain in effect (meaning this test can be used) for the duration of the COVID-19 declaration under Section 564(b)(1) of the Act, 21 U.S.C. section 360bbb-3(b)(1), unless the authorization is terminated or revoked.  Performed at [facility], [address] [place], [place] [address]   Aerobic/Anaerobic Culture w Gram Stain (surgical/deep wound)     Status: None (Preliminary result)   Collection Time: 03/17/21  4:12 PM   Specimen: Wound  Result Value Ref Range Status   Specimen Description   Final    WOUND Performed at [facility], [address] [place], [place] [address]    Special Requests   Final    LEFT FOOT Performed at [facility], [place], [place] [address]    Gram Stain   Final    NO WBC SEEN FEW GRAM VARIABLE ROD RARE GRAM POSITIVE COCCI IN PAIRS Performed at [facility], [place] [address], [place], [place] [address]    Culture   Final    ABUNDANT MULTIPLE ORGANISMS PRESENT, NONE PREDOMINANT NO STAPHYLOCOCCUS AUREUS ISOLATED NO GROUP A STREP (S.PYOGENES) ISOLATED MIXED ANAEROBIC FLORA PRESENT.  CALL LAB IF FURTHER IID REQUIRED.    Report Status PENDING  Incomplete  Culture, blood (Routine x 2)     Status: None (Preliminary result)   Collection Time: 03/17/21  5:33 PM   Specimen: BLOOD  Result Value Ref Range Status   Specimen Description BLOOD BLOOD LEFT HAND  Final   Special Requests   Final    BOTTLES DRAWN AEROBIC AND ANAEROBIC Blood Culture adequate volume   Culture   Final    NO GROWTH 3 DAYS Performed at [facility], [address] [place], [place] [address]    Report Status PENDING  Incomplete  Resp Panel by RT-PCR (Flu A&B, Covid) Nasopharyngeal Swab     Status: None   Collection Time: 03/22/21 10:07 AM   Specimen: Nasopharyngeal Swab; Nasopharyngeal(NP) swabs in vial transport  medium  Result Value Ref Range Status   SARS Coronavirus  2 by RT PCR NEGATIVE NEGATIVE Final    Comment: (NOTE) SARS-CoV-2 target nucleic acids are NOT DETECTED.  The SARS-CoV-2 RNA is generally detectable in upper respiratory specimens during the acute phase of infection. The lowest concentration of SARS-CoV-2 viral copies this assay can detect is 138 copies/mL. A negative result does not preclude SARS-Cov-2 infection and should not be used as the sole basis for treatment or other patient management decisions. A negative result may occur with  improper specimen collection/handling, submission of specimen other than nasopharyngeal swab, presence of viral mutation(s) within the areas targeted by this assay, and inadequate number of viral copies(<138 copies/mL). A negative result must be combined with clinical observations, patient history, and epidemiological information. The expected result is Negative.  Fact Sheet for Patients:  EntrepreneurPulse.com.au  Fact Sheet for Healthcare Providers:  IncredibleEmployment.be  This test is no t yet approved or cleared by the [place] FDA and  has been authorized for detection and/or diagnosis of SARS-CoV-2 by FDA under an Emergency Use Authorization (EUA). This EUA will remain  in effect (meaning this test can be used) for the duration of the COVID-19 declaration under Section 564(b)(1) of the Act, 21 U.S.C.section 360bbb-3(b)(1), unless the authorization is terminated  or revoked sooner.       Influenza A by PCR NEGATIVE NEGATIVE Final   Influenza B by PCR NEGATIVE NEGATIVE Final    Comment: (NOTE) The Xpert Xpress SARS-CoV-2/FLU/RSV plus assay is intended as an aid in the diagnosis of influenza from Nasopharyngeal swab specimens and should not be used as a sole basis for treatment. Nasal washings and aspirates are unacceptable for Xpert Xpress SARS-CoV-2/FLU/RSV testing.  Fact Sheet for Patients: EntrepreneurPulse.com.au  Fact  Sheet for Healthcare Providers: IncredibleEmployment.be  This test is not yet approved or cleared by the [place] FDA and has been authorized for detection and/or diagnosis of SARS-CoV-2 by FDA under an Emergency Use Authorization (EUA). This EUA will remain in effect (meaning this test can be used) for the duration of the COVID-19 declaration under Section 564(b)(1) of the Act, 21 U.S.C. section 360bbb-3(b)(1), unless the authorization is terminated or revoked.  Performed at [facility], [address] [place], [place] [address]      Radiological Exams on Admission: DG Chest Portable 1 View  Result Date: 03/22/2021 CLINICAL DATA:  Shortness of breath EXAM: PORTABLE CHEST 1 VIEW COMPARISON:  03/17/2021 FINDINGS: Midline trachea. Normal heart size for level of inspiration. Mediastinal soft tissue fullness is likely technique related in this obese patient. No pleural effusion or pneumothorax. Pulmonary interstitial prominence is also favored to be technique related and partially secondary to low lung volumes. No congestive failure. There may be mild left base subsegmental atelectasis. IMPRESSION: Cardiomegaly and low lung volumes, without acute disease. Electronically Signed   By: Abigail Miyamoto M.D.   On: 03/22/2021 10:29     EKG: I have personally reviewed.  Sinus rhythm, QTC 464, low voltage, nonspecific to change  Assessment/Plan Principal Problem:   Acute on chronic respiratory failure with hypoxia and hypercapnia (HCC) Active Problems:   OSA on CPAP   Morbid obesity with BMI of 50.0-59.9, adult (HCC)   Depression   Hypothyroid   Chronic diastolic CHF (congestive heart failure) (HCC)   Acute metabolic encephalopathy   CKD (chronic kidney disease), stage IIIa   Leukocytosis   Elevated troponin   Open wound of left foot  Acute on chronic respiratory  failure with hypoxia and hypercapnia ([place]): Etiology is not clear.  ABG with pH of 7.36, CO2 87,  O2 58.  Patient has morbid obesity and OSA on CPAP.  Possible explanation is obstructive hypoventilation syndrome.  Very difficult to assess volume status due to morbid obesity.  Patient has 1+leg edema and chronic venous insufficiency change. Will start IV lasix empirically  -Admitted to progressive bed as inpatient -Continue BiPAP -IV Lasix 40 mg twice daily -Bronchodilators  OSA on CPAP -Currently on BiPAP  Morbid obesity with BMI of 50.0-59.9, adult (HCC) -need Diet and exercise.   -need to encourage to lose weight.   Depression -Wellbutrin  Hypothyroid -Synthroid  Chronic diastolic CHF (congestive heart failure) ([place]): 2D echo on 05/19/2020 showed EF> 55%.  Very difficult to assess volume status due to morbid obesity.  Since patient has leg edema, will start diuretics empirically -Started IV Lasix 40 mg twice daily  Acute metabolic encephalopathy: Etiology is not clear.  No focal neurodeficit on physical examination, may be related to CO2 retention -Frequent neuro check -Follow-up CT head  CKD (chronic kidney disease), stage IIIa: Stable Baseline creatinine 1.2-1.5.  His creatinine is 1.48, BUN 21 -Follow-up with BMP  Leukocytosis: WBC 14.3.  No fever.  No source of infection identified, likely reactive -Follow-up blood culture and urinalysis  Elevated troponin: trop 21.  Possible demand ischemia -Trend troponin -Aspirin -Check FLP (patient had A1c 5.0 on 03/17/2021)  Open wound of left foot: Does not seem to be infected -Wound care consult    DVT ppx:  SQ Lovenox Code Status: Full code Family Communication: Yes, patient's daughter   at bed side Disposition Plan:  Anticipate discharge back to previous environment Consults called:  none Admission status and Level of care: Progressive Cardiac:    as inpt       Status is: Inpatient  Remains inpatient appropriate because:Inpatient level of care appropriate due to severity of illness  Dispo: The patient is from:  SNF              Anticipated d/c is to: SNF              Patient currently is not medically stable to d/c.   Difficult to place patient No          Date of Service 03/22/2021    [facility]   If 7PM-7AM, please contact night-coverage [email or web address] 03/22/2021, 6:26 PM

## 2021-03-22 NOTE — ED Provider Notes (Signed)
[facility] Emergency Department Provider Note   ____________________________________________   Event Date/Time   First MD Initiated Contact with Patient 03/22/21 [phone]     (approximate)  I have reviewed the triage vital signs and the nursing notes.   HISTORY  Chief Complaint Shortness of Breath    HPI Christopher [name] is a 70 y.o. male with past medical history of hyperlipidemia, diastolic CHF, and obstructive sleep apnea who presents to the ED for shortness of breath.  EMS reports that staff at [facility] noticed patient to be more somnolent and short of breath than usual earlier this morning.  He was found to have O2 sats of 79% on room air, although patient apparently wears 2 L nasal cannula chronically.  He was placed on a nonrebreather and O2 sats improved, however he was also noted to be more confused than usual by EMS.  EMS states that his awareness seem to improve after nonrebreather was placed, patient now states he is not sure why he is here and that he was "kidnapped."  He states that he has been having intermittent pain in his chest as well as difficulty breathing for the past few days, denies any fevers or cough.  Patient currently residing at [facility] for rehab following admission for foot infection.  He was noted to be febrile by EMS.        Past Medical History:  Diagnosis Date   Anxiety    CHF (congestive heart failure) ([place])    Depression    Dysrhythmia    Hypothyroidism    Sleep apnea     Patient Active Problem List   Diagnosis Date Noted   Acute on chronic respiratory failure with hypoxia and hypercapnia ([place]) 03/22/2021   Osteomyelitis of ankle or foot, acute, left ([place]) 03/17/2021   Acute on chronic respiratory failure ([place]) 03/17/2021   Osteomyelitis ([place]) 12/16/2020   Closed left ankle fracture 12/16/2020   OSA on CPAP 12/16/2020   Morbid obesity with BMI of 50.0-59.9, adult ([place]) 12/16/2020    Hyperlipidemia, mixed 12/16/2020   Depression 12/16/2020   Hypothyroid 12/16/2020   Chronic diastolic CHF (congestive heart failure) ([place]) 12/16/2020    Past Surgical History:  Procedure Laterality Date   FRACTURE SURGERY     ORIF ANKLE FRACTURE Left 12/17/2020   Procedure: OPEN REDUCTION INTERNAL FIXATION (ORIF) ANKLE FRACTURE;  Surgeon: Samara Deist, DPM;  Location: [facility] ORS;  Service: Podiatry;  Laterality: Left;    Prior to Admission medications   Medication Sig Start Date End Date Taking? Authorizing Provider  aspirin 81 MG EC tablet Take 81 mg by mouth at bedtime.   Yes [provider]  atorvastatin (LIPITOR) 20 MG tablet Take 20 mg by mouth at bedtime. 10/06/20  Yes [provider]  buPROPion (WELLBUTRIN SR) 150 MG 12 hr tablet Take 150 mg by mouth 2 (two) times daily. After 1st meal of day and after dinner 12/02/20  Yes [provider]  Cholecalciferol 25 MCG (1000 UT) capsule Take 1,000 Units by mouth at bedtime.   Yes [provider]  cyanocobalamin 1000 MCG tablet Take 1,000 mcg by mouth 2 (two) times a week. At bedtime. Sundays and Wednesdays   Yes [provider]  furosemide (LASIX) 40 MG tablet Take 1 tablet (40 mg total) by mouth every other day. After 1st meal of the day 03/19/21  Yes Lorella Nimrod, MD  gabapentin (NEURONTIN) 300 MG capsule Take 300 mg by mouth 3 (three) times daily. After  1st meal of day, after dinner, and at bedtime 04/07/20  Yes [provider]  levothyroxine (SYNTHROID) 88 MCG tablet Take 88 mcg by mouth daily. 30 to 60 minutes before breakfast on an empty stomach and with a glass of water 12/02/20  Yes [provider]  Multiple Vitamins-Minerals (PRESERVISION AREDS 2) CAPS Take 1 capsule by mouth 2 (two) times daily. With food. After 1st meal of day and after dinner   Yes [provider]  potassium chloride (KLOR-CON) 10 MEQ tablet Take 10 mEq by mouth 2 (two) times daily. After 1st meal  of day and after dinner 10/21/20  Yes [provider]  propranolol (INDERAL) 40 MG tablet Take 40 mg by mouth daily. After dinner 01/01/21  Yes [provider]  Ensure Max Protein (ENSURE MAX PROTEIN) LIQD Take 330 mLs (11 oz total) by mouth 2 (two) times daily. 03/19/21   Lorella Nimrod, MD  ibuprofen (ADVIL) 200 MG tablet Take 400-600 mg by mouth every 6 (six) hours as needed for mild pain or moderate pain.    [provider]  levalbuterol Penne Lash HFA) 45 MCG/ACT inhaler Inhale 2 puffs into the lungs every 4 (four) hours as needed for wheezing.    [provider]  mupirocin ointment (BACTROBAN) 2 % Apply topically daily. 03/19/21   Lorella Nimrod, MD  OXYGEN Inhale 2 L into the lungs continuous.    [provider]  trolamine salicylate (ASPERCREME) 10 % cream Apply 1 application topically as needed for muscle pain. Apply to knees and feet    [provider]  Vitamins A & D (VITAMIN A & D) ointment Apply 1 application topically as needed. To groin area.    [provider]    Allergies Albuterol, Grass extracts [gramineae pollens], and Other  Family History  Problem Relation Age of Onset   Hypertension Mother     Social History Social History   Tobacco Use   Smoking status: Never   Smokeless tobacco: Never  Vaping Use   Vaping Use: Never used  Substance Use Topics   Alcohol use: Not Currently   Drug use: Never    Review of Systems  Constitutional: Positive for fever/chills Eyes: No visual changes. ENT: No sore throat. Cardiovascular: Positive for chest pain. Respiratory: Positive for shortness of breath. Gastrointestinal: No abdominal pain.  No nausea, no vomiting.  No diarrhea.  No constipation. Genitourinary: Negative for dysuria. Musculoskeletal: Negative for back pain. Skin: Negative for rash. Neurological: Negative for headaches, focal weakness or  numbness.  ____________________________________________   PHYSICAL EXAM:  VITAL SIGNS: ED Triage Vitals  Enc Vitals Group     BP --      Pulse Rate 03/22/21 1005 91     Resp 03/22/21 1005 (!) 23     Temp --      Temp src --      SpO2 03/22/21 1005 90 %     Weight 03/22/21 1003 (!) 360 lb (163.3 kg)     Height 03/22/21 1003 '5\' 11"'$  (1.803 m)     Head Circumference --      Peak Flow --      Pain Score 03/22/21 1002 0     Pain Loc --      Pain Edu? --      Excl. in [place]? --     Constitutional: Alert and oriented. Eyes: Conjunctivae are normal. Head: Atraumatic. Nose: No congestion/rhinnorhea. Mouth/Throat: Mucous membranes are moist. Neck: Normal ROM Cardiovascular: Normal rate, regular rhythm.  Grossly normal heart sounds. Respiratory: Mildly tachypneic with increased respiratory effort.  No retractions. Lungs faint to auscultation bilaterally, no wheezes, rales or rhonchi noted. Gastrointestinal: Soft and nontender. No distention. Genitourinary: deferred Musculoskeletal: No lower extremity tenderness, 1+ pitting edema to knees bilaterally.  Dressing in place to left foot. Neurologic:  Normal speech and language. No gross focal neurologic deficits are appreciated. Skin:  Skin is warm, dry and intact. No rash noted. Psychiatric: Mood and affect are normal. Speech and behavior are normal.  ____________________________________________   LABS (all labs ordered are listed, but only abnormal results are displayed)  Labs Reviewed  CBC WITH DIFFERENTIAL/PLATELET - Abnormal; Notable for the following components:      Result Value   WBC 14.3 (*)    MCHC 29.9 (*)    Neutro Abs 11.6 (*)    Monocytes Absolute 1.2 (*)    All other components within normal limits  BASIC METABOLIC PANEL - Abnormal; Notable for the following components:   Chloride 93 (*)    CO2 39 (*)    Glucose, Bld 121 (*)    Creatinine, Ser 1.48 (*)    GFR, Estimated 51 (*)    All other components within  normal limits  BLOOD GAS, VENOUS - Abnormal; Notable for the following components:   pCO2, Ven 87 (*)    Bicarbonate 49.1 (*)    Acid-Base Excess 18.5 (*)    All other components within normal limits  TROPONIN I (HIGH SENSITIVITY) - Abnormal; Notable for the following components:   Troponin I (High Sensitivity) 21 (*)    All other components within normal limits  RESP PANEL BY RT-PCR (FLU A&B, COVID) ARPGX2  CULTURE, BLOOD (ROUTINE X 2)  CULTURE, BLOOD (ROUTINE X 2)  LACTIC ACID, PLASMA  BRAIN NATRIURETIC PEPTIDE  URINALYSIS, COMPLETE (UACMP) WITH MICROSCOPIC  TROPONIN I (HIGH SENSITIVITY)   ____________________________________________  EKG  ED ECG REPORT I, Blake Divine, the attending physician, personally viewed and interpreted this ECG.   Date: 03/22/2021  EKG Time: 10:12  Rate: 88  Rhythm: normal sinus rhythm  Axis: Normal  Intervals:none  ST&T Change: None   PROCEDURES  Procedure(s) performed (including Critical Care):  Procedures   ____________________________________________   INITIAL IMPRESSION / ASSESSMENT AND PLAN / ED COURSE      70 year old male with past medical history of hyperlipidemia, CHF, and OSA who presents to the ED for increased confusion along with difficulty breathing starting this morning.  He was placed on a nonrebreather by EMS, which was removed on arrival and replaced with nasal cannula.  Patient noted to have borderline low O2 sats on 2 L nasal cannula and he was increased to 4 L.  He is awake and alert, but remains somewhat disoriented, likely secondary to hypercapnic respiratory failure.  VBG does show elevated PCO2 although a portion of this is likely chronic given his reassuring pH.  We will trial patient on BiPAP, further assess with labs and chest x-ray.  EKG shows no evidence of arrhythmia or ischemia, troponin is pending.  Patient appears improved on BiPAP, slightly more awake and alert.  Chest x-ray reviewed by me and shows no  focal infiltrate, edema, or effusion.  Despite being febrile with EMS, patient has been afebrile here in the ED without intervention and there is no evidence of infectious process at this time.  Suspect the primary driver of his hypercapnic respiratory failure is obesity hypoventilation syndrome.  Case discussed with hospitalist for admission.      ____________________________________________  FINAL CLINICAL IMPRESSION(S) / ED DIAGNOSES  Final diagnoses:  Acute on chronic respiratory failure with hypercapnia (HCC)  Obesity hypoventilation syndrome [facility])     ED Discharge Orders     None        Note:  This document was prepared using Dragon voice recognition software and may include unintentional dictation errors.    Blake Divine, MD 03/22/21 1151

## 2021-03-22 NOTE — Consult Note (Signed)
WOC Nurse Consult Note: Reason for Consult: Left medial malleolus wound, chronic, nonhealing and two toes on left foot (digits 2 and 3). Patient seen by my associate K. Sanders and Vascular PA last week on Thursday, 03/18/21 during his last admission. Wound type:Venous insufficiency vs PAD vs lymphedema vs. mixed etiology Pressure Injury POA:N/A Measurement:Left medial malleolus wound, 1cm x 0.6cm x 0.2cm, pale pink wound bed, small serous exudate Two digits are scabbed with dried serum Wound bed:As described above Drainage (amount, consistency, odor) As described above  Periwound: edematous Dressing procedure/placement/frequency: Orders are provided for topical care and a Prevalon boot provided for the left foot to both correct lateral rotation of the LE and to prevent injury.  Patient to follow with Vascular as previously determined.  [facility] nursing team will not follow, but will remain available to this patient, the nursing and medical teams.  Please re-consult if needed. Thanks, Maudie Flakes, MSN, RN, [place], Arther Abbott  Pager# [phone]

## 2021-03-23 ENCOUNTER — Encounter: Payer: Self-pay | Admitting: Internal Medicine

## 2021-03-23 ENCOUNTER — Inpatient Hospital Stay: Payer: Medicare Other

## 2021-03-23 DIAGNOSIS — J9622 Acute and chronic respiratory failure with hypercapnia: Secondary | ICD-10-CM | POA: Diagnosis not present

## 2021-03-23 DIAGNOSIS — J9621 Acute and chronic respiratory failure with hypoxia: Secondary | ICD-10-CM | POA: Diagnosis not present

## 2021-03-23 LAB — CBC
HCT: 40.5 % (ref 39.0–52.0)
Hemoglobin: 12.2 g/dL — ABNORMAL LOW (ref 13.0–17.0)
MCH: 28.7 pg (ref 26.0–34.0)
MCHC: 30.1 g/dL (ref 30.0–36.0)
MCV: 95.3 fL (ref 80.0–100.0)
Platelets: 187 10*3/uL (ref 150–400)
RBC: 4.25 MIL/uL (ref 4.22–5.81)
RDW: 15.7 % — ABNORMAL HIGH (ref 11.5–15.5)
WBC: 12.2 10*3/uL — ABNORMAL HIGH (ref 4.0–10.5)
nRBC: 0 % (ref 0.0–0.2)

## 2021-03-23 LAB — LIPID PANEL
Cholesterol: 88 mg/dL (ref 0–200)
HDL: 26 mg/dL — ABNORMAL LOW (ref >40)
LDL Cholesterol: 46 mg/dL (ref 0–99)
Total CHOL/HDL Ratio: 3.4 RATIO
Triglycerides: 78 mg/dL (ref <150)
VLDL: 16 mg/dL (ref 0–40)

## 2021-03-23 LAB — AEROBIC/ANAEROBIC CULTURE W GRAM STAIN (SURGICAL/DEEP WOUND): Gram Stain: NONE SEEN

## 2021-03-23 LAB — BASIC METABOLIC PANEL
Anion gap: 8 (ref 5–15)
BUN: 26 mg/dL — ABNORMAL HIGH (ref 8–23)
CO2: 40 mmol/L — ABNORMAL HIGH (ref 22–32)
Calcium: 8.4 mg/dL — ABNORMAL LOW (ref 8.9–10.3)
Chloride: 92 mmol/L — ABNORMAL LOW (ref 98–111)
Creatinine, Ser: 1.74 mg/dL — ABNORMAL HIGH (ref 0.61–1.24)
GFR, Estimated: 42 mL/min — ABNORMAL LOW (ref >60)
Glucose, Bld: 116 mg/dL — ABNORMAL HIGH (ref 70–99)
Potassium: 4.3 mmol/L (ref 3.5–5.1)
Sodium: 140 mmol/L (ref 135–145)

## 2021-03-23 LAB — TROPONIN I (HIGH SENSITIVITY): Troponin I (High Sensitivity): 12 ng/L (ref <18)

## 2021-03-23 MED ORDER — IPRATROPIUM BROMIDE 0.02 % IN SOLN
0.5000 mg | Freq: Three times a day (TID) | RESPIRATORY_TRACT | Status: DC
  Administered 2021-03-23 – 2021-03-24 (×3): 0.5 mg via RESPIRATORY_TRACT
  Filled 2021-03-23 (×3): qty 2.5

## 2021-03-23 MED ORDER — COVID-19 MRNA VAC-TRIS(PFIZER) 30 MCG/0.3ML IM SUSP
0.3000 mL | Freq: Once | INTRAMUSCULAR | Status: DC
  Filled 2021-03-23: qty 0.3

## 2021-03-23 NOTE — Progress Notes (Signed)
Christopher [name] at [facility] NAME: Christopher [name]    MR#:  [id number]  DATE OF BIRTH:  [birth year]  SUBJECTIVE:  patient was discharged two days ago came in from [facility] with altered mental status and hypoxia. Apparently seems patient removed his oxygen/likely CPAP at the facility.  currently awake alert oriented. Uses BiPAP at night here.  REVIEW OF SYSTEMS:   Review of Systems  Constitutional:  Negative for chills, fever and weight loss.  HENT:  Negative for ear discharge, ear pain and nosebleeds.   Eyes:  Negative for blurred vision, pain and discharge.  Respiratory:  Negative for sputum production, shortness of breath, wheezing and stridor.   Cardiovascular:  Negative for chest pain, palpitations, orthopnea and PND.  Gastrointestinal:  Negative for abdominal pain, diarrhea, nausea and vomiting.  Genitourinary:  Negative for frequency and urgency.  Musculoskeletal:  Negative for back pain and joint pain.  Neurological:  Positive for weakness. Negative for sensory change, speech change and focal weakness.  Psychiatric/Behavioral:  Negative for depression and hallucinations. The patient is not nervous/anxious.   Tolerating Diet: Tolerating PT:   DRUG ALLERGIES:   Allergies  Allergen Reactions  . Albuterol Other (See Comments)    Causes afib   . Grass Extracts [Gramineae Pollens] Other (See Comments)    Sneeze and watery eyes  . Other     Other reaction(s): Other (See Comments) Unable to breath when around cats    VITALS:  Blood pressure (!) 89/52, pulse 72, temperature 98.7 F (37.1 C), temperature source Oral, resp. rate 16, height '5\' 11"'$  (1.803 m), weight (!) 169.4 kg, SpO2 94 %.  PHYSICAL EXAMINATION:   Physical Exam  GENERAL:  70 y.o.-year-old patient lying in the bed with no acute distress. Morbidly obese. Chronically ill LUNGS: distant breath sounds bilaterally, no wheezing, rales, rhonchi. No use of accessory  muscles of respiration.  CARDIOVASCULAR: S1, S2 normal. No murmurs, rubs, or gallops.  ABDOMEN: Soft, nontender, nondistended. Bowel sounds present. No organomegaly or mass.  EXTREMITIES: bilateral lateral lower extremity edema chronic with chronic pressure sores NEUROLOGIC: moves all extremities well PSYCHIATRIC:  patient is alert and oriented x 3.  SKIN:  Pressure Injury 03/18/21 Ankle Left;Medial Unstageable - Full thickness tissue loss in which the base of the injury is covered by slough (yellow, tan, gray, green or brown) and/or eschar (tan, brown or black) in the wound bed. (Active)  03/18/21 0000  Location: Ankle  Location Orientation: Left;Medial  Staging: Unstageable - Full thickness tissue loss in which the base of the injury is covered by slough (yellow, tan, gray, green or brown) and/or eschar (tan, brown or black) in the wound bed.  Wound Description (Comments):   Present on Admission: Yes        LABORATORY PANEL:  CBC Recent Labs  Lab 03/23/21 0033  WBC 12.2*  HGB 12.2*  HCT 40.5  PLT 187    Chemistries  Recent Labs  Lab 03/17/21 1321 03/18/21 0550 03/23/21 0033  NA 143   < > 140  K 4.9   < > 4.3  CL 95*   < > 92*  CO2 40*   < > 40*  GLUCOSE 114*   < > 116*  BUN 31*   < > 26*  CREATININE 1.50*   < > 1.74*  CALCIUM 9.4   < > 8.4*  AST 11*  --   --   ALT 7  --   --  ALKPHOS 54  --   --   BILITOT 1.1  --   --    < > = values in this interval not displayed.   Cardiac Enzymes No results for input(s): TROPONINI in the last 168 hours. RADIOLOGY:  CT HEAD WO CONTRAST  Result Date: 03/23/2021 CLINICAL DATA:  70 year old male with history of mental status change. EXAM: CT HEAD WITHOUT CONTRAST TECHNIQUE: Contiguous axial images were obtained from the base of the skull through the vertex without intravenous contrast. COMPARISON:  No priors. FINDINGS: Brain: Mild cerebral atrophy. Patchy and confluent areas of decreased attenuation are noted throughout the  deep and periventricular white matter of the cerebral hemispheres bilaterally, compatible with chronic microvascular ischemic disease. No evidence of acute infarction, hemorrhage, hydrocephalus, extra-axial collection or mass lesion/mass effect. Vascular: No hyperdense vessel or unexpected calcification. Skull: Normal. Negative for fracture or focal lesion. Sinuses/Orbits: No acute finding. Other: Small right mastoid effusion. IMPRESSION: 1. No acute intracranial abnormalities. 2. Small right mastoid effusion. 3. Mild cerebral atrophy with extensive chronic microvascular ischemic changes in the cerebral white matter, as above. Electronically Signed   By: Vinnie Langton M.D.   On: 03/23/2021 08:05   DG Chest Portable 1 View  Result Date: 03/22/2021 CLINICAL DATA:  Shortness of breath EXAM: PORTABLE CHEST 1 VIEW COMPARISON:  03/17/2021 FINDINGS: Midline trachea. Normal heart size for level of inspiration. Mediastinal soft tissue fullness is likely technique related in this obese patient. No pleural effusion or pneumothorax. Pulmonary interstitial prominence is also favored to be technique related and partially secondary to low lung volumes. No congestive failure. There may be mild left base subsegmental atelectasis. IMPRESSION: Cardiomegaly and low lung volumes, without acute disease. Electronically Signed   By: Abigail Miyamoto M.D.   On: 03/22/2021 10:29   ASSESSMENT AND PLAN:  Christopher [name] is a 70 y.o. male with medical history significant of hyperlipidemia, hypothyroidism, depression with anxiety, obesity, OSA on CPAP, dCHF, CKD stage IIIa, status post ORIF for closed left ankle fracture, who presents with shortness of breath and altered mental status. Patient normally uses 2 L oxygen, but was found to have oxygen desaturating to 79% on room air, nonrebreather is placed, oxygen is back to 98%, but the patient is still confused. VBG with pH 7.36, CO2 87 and O2 58.  BiPAP is started in ED.  Acute on chronic  respiratory failure with hypoxia and hypercapnia (HCC): acute metabolic encephalopathy suspected due to hypoxia and due to narcosis  --ABG with pH of 7.36, CO2 87, O2 58.  Patient has morbid obesity and OSA on CPAP.  Possible explanation is obstructive hypoventilation syndrome.   -- patient's mentation is back to baseline. He is recommended to continue using BiPAP here while taking naps and spoke with daughter Adonis Brook who has taken CPAP at [place]. Patient will you continue to use during daytime and afternoon naps along with his oxygen -Bronchodilators -- CT head unremarkable   OSA on CPAP -Currently on BiPAP   Morbid obesity with BMI of 50.0-59.9, adult (HCC)   -need to encourage to lose weight.    Depression -Wellbutrin   Hypothyroid -Synthroid   Chronic diastolic CHF (congestive heart failure) ([place]): 2D echo on 05/19/2020 showed EF> 55%.  Very difficult to assess volume status due to morbid obesity.  Since patient has leg edema, will start diuretics empirically -Started IV Lasix 40 mg twice daily     CKD (chronic kidney disease), stage IIIa: Stable Baseline creatinine 1.2-1.5.  His creatinine is 1.48,  BUN 21 -Follow-up with BMP   Leukocytosis: WBC 14.3.  No fever.  No source of infection identified, likely reactive   Elevated troponin: trop 21.  Possible demand ischemia -Trend troponin -Aspirin --patient had A1c 5.0 on 03/17/2021    Open wound of left foot: Does not seem to be infected -Wound care consult appreciated  if patient continues to show improvement will return back to [place] tomorrow. This was discussed with patient's daughter Adonis Brook on the phone       DVT ppx:  SQ Lovenox Code Status: Full code Family Communication: Yes, patient's daughter on the phone Disposition Plan:  Anticipate discharge back to previous environment Consults called:  none Admission status and Level of care: Progressive Cardiac:    as inpt          Status is:  Inpatient   Remains inpatient appropriate because:Inpatient level of care appropriate due to severity of illness   Dispo: The patient is from: SNF              Anticipated d/c is to: SNF ?8/2              Patient currently is not medically stable to d/c.              Difficult to place patient No         TOTAL TIME TAKING CARE OF THIS PATIENT: 25 minutes.  >50% time spent on counselling and coordination of care  Note: This dictation was prepared with Dragon dictation along with smaller phrase technology. Any transcriptional errors that result from this process are unintentional.  Fritzi Mandes M.D    [facility]   CC: Primary care physician; Rusty Aus, MD Patient ID: Evangeline Dakin, male   DOB: [birth year], 70 y.o.   MRN: [id number]

## 2021-03-23 NOTE — TOC Initial Note (Signed)
Transition of Care [facility]) - Initial/Assessment Note    Patient Details  Name: Christopher [name] MRN: [id number] Date of Birth: 09-29-1950  Transition of Care [facility]) CM/SW Contact:    Alberteen Sam, LCSW Phone Number: 03/23/2021, 2:19 PM  Clinical Narrative:                  CSW spoke with patient daughter [name], confirmed home CPAP is at facility at [facility], confirmed with [name] plan to return tomorrow. She gave her consent for patient to get second covid booster shot as first shot was given in January of this year.   CSW has informed [facility] of plan to return to SNF tomorrow     Expected Discharge Plan: Skilled Nursing Facility Barriers to Discharge: Continued Medical Work up   Patient Goals and CMS Choice   CMS Medicare.gov Compare Post Acute Care list provided to:: Patient Represenative (must comment) (daughter [name]) Choice offered to / list presented to : Adult Children  Expected Discharge Plan and Services Expected Discharge Plan: [place] Acute Care Choice: [place] Living arrangements for the past 2 months: [place]                                      Prior Living Arrangements/Services Living arrangements for the past 2 months: [place] Lives with:: Facility Resident Patient language and need for interpreter reviewed:: Yes Do you feel safe going back to the place where you live?: Yes      Need for Family Participation in Patient Care: Yes (Comment) Care giver support system in place?: Yes (comment)   Criminal Activity/Legal Involvement Pertinent to Current Situation/Hospitalization: No - Comment as needed  Activities of Daily Living Home Assistive Devices/Equipment: Wheelchair, Environmental consultant (specify type), CPAP ADL Screening (condition at time of admission) Patient's cognitive ability adequate to safely complete daily activities?: Yes Is the patient deaf or have difficulty hearing?: No Does  the patient have difficulty seeing, even when wearing glasses/contacts?: No Does the patient have difficulty concentrating, remembering, or making decisions?: No Patient able to express need for assistance with ADLs?: Yes Does the patient have difficulty dressing or bathing?: Yes Independently performs ADLs?: No Communication: Independent Dressing (OT): Dependent Is this a change from baseline?: Change from baseline, expected to last >3 days Grooming: Needs assistance Is this a change from baseline?: Change from baseline, expected to last >3 days Feeding: Independent Bathing: Dependent Is this a change from baseline?: Change from baseline, expected to last >3 days Toileting: Needs assistance Is this a change from baseline?: Change from baseline, expected to last >3days In/Out Bed: Dependent Is this a change from baseline?: Change from baseline, expected to last >3 days Walks in Home: Dependent Is this a change from baseline?: Change from baseline, expected to last >3 days Does the patient have difficulty walking or climbing stairs?: Yes Weakness of Legs: Both Weakness of Arms/Hands: None  Permission Sought/Granted Permission sought to share information with : Case Manager, Customer service manager, Family Supports Permission granted to share information with : Yes, Verbal Permission Granted  Share Information with NAME: [name]  Permission granted to share info w AGENCY: SNFs  Permission granted to share info w Relationship: daughter  Permission granted to share info w Contact Information: [phone]  Emotional Assessment Appearance:: Appears stated age       Alcohol / Substance Use: Not  Applicable Psych Involvement: No (comment)  Admission diagnosis:  Obesity hypoventilation syndrome ([place]) [E66.2] Acute on chronic respiratory failure with hypercapnia (HCC) [J96.22] Acute on chronic respiratory failure with hypoxia and hypercapnia (HCC) [id number], J96.22] Patient  Active Problem List   Diagnosis Date Noted   Acute on chronic respiratory failure with hypoxia and hypercapnia (HCC) Q000111Q   Acute metabolic encephalopathy Q000111Q   CKD (chronic kidney disease), stage IIIa 03/22/2021   Leukocytosis 03/22/2021   Elevated troponin 03/22/2021   Open wound of left foot 03/22/2021   Osteomyelitis of ankle or foot, acute, left ([place]) 03/17/2021   Acute on chronic respiratory failure ([place]) 03/17/2021   Osteomyelitis ([place]) 12/16/2020   Closed left ankle fracture 12/16/2020   OSA on CPAP 12/16/2020   Morbid obesity with BMI of 50.0-59.9, adult ([place]) 12/16/2020   Hyperlipidemia, mixed 12/16/2020   Depression 12/16/2020   Hypothyroid 12/16/2020   Chronic diastolic CHF (congestive heart failure) ([place]) 12/16/2020   PCP:  Rusty Aus, MD Pharmacy:   [facility] [id number], [place] [address] [place] [address] Phone: [phone] Fax: [phone]     Social Determinants of Health (SDOH) Interventions    Readmission Risk Interventions No flowsheet data found.

## 2021-03-23 NOTE — Consult Note (Signed)
WOC Nurse Consult Note: Reason for Consult: Suspected seep tissue pressure injury to buttocks, posterior thighs, perineum. Skin is cool, moist. Areas does not blanche. No pain.  Wound type: Pressure vs venous congestion vs MASD Pressure Injury POA: Yes Measurement: See above Wound bed:N/A  No break in the integument. Drainage (amount, consistency, odor) None Periwound: Intact. Dressing procedure/placement/frequency: Patient of bariatric proportion, stays supine in standard bed; I will add a bariatric bed with mattress replacement with low air loss feature. Prevalon boot in place from yesterday. Patient can assist with turn and reposition.  Moisture barrier cream is ordered twice daily.  [place] nursing team will not follow, but will remain available to this patient, the nursing and medical teams.  Please re-consult if needed. Thanks, Maudie Flakes, MSN, RN, [place], Arther Abbott  Pager# [phone]

## 2021-03-23 NOTE — Progress Notes (Signed)
Pt scored a 6 on the RT protocol assessment. The nebulizer treatments are changed to TID and prn.

## 2021-03-23 NOTE — Evaluation (Signed)
Physical Therapy Evaluation Patient Details Name: Christopher [name] MRN: [id number] DOB: 05/26/1951 Today's Date: 03/23/2021   History of Present Illness  Christopher [name] is a 84yoM who comes to [facility] 03/22/21 after 1 day at [facility] due to worsening confusion and desaturation. Pt here last week with Left foot pain. Per DTR pt has had decline in mobility since ankle ORIF April 2022, recently more confused and with visual hallucination. PMH: morbid obesity, OSA on CPAP, hypoTSH, depression, dCHF, Left ankle fracture s/p ORIF. Pt seen by podiatry, feels pt has new wounds related to venous insufficiceny without evidence of surgical infection.  Podiatry recommending actvity as tolerated with post op boot when up mobilizing.  Pt hasbeen working with HHPT for >4 weeks, has not been able to return to any appreciable walking aside from SPT WC to/from recliner. Pt has sustained several falls at home, some involving legs buckling. [name] to STR last week to build strength and general mobility prior to return to home.  Clinical Impression  Pt admitted with above diagnosis. Pt currently with functional limitations due to the deficits listed below (see "PT Problem List"). Upon entry, pt in bed, awake and agreeable to participate. Pt familiar to author from prior admission 5 days ago, but pt has no recollection of being here last week, does not remember author at all to the degree that he believes author to have him confused with another patient. Pt is able explain that he is at a '[facility] facility' and that he arrived here from 'I don't know..: a medical appointment?" But he does not recall being at STR/SNF. Pt on 5L at entry, SpO2 95%. MinA for bed mobility, mod-maxA for transfers, but able to rise from elevated surface without assist after education and training. Pt tolerating standing for 30-45 seconds each time without difficulty, but heavily dependent on arms on RW. Pt able to take several sidesteps toward center of bed. Patient's  performance this date reveals decreased ability, independence, and tolerance in performing all basic mobility required for performance of activities of daily living, but if quite consistent with performance prior admission. Recommendations for rehab prior to return to home still offer the best chance to regain independence and ability to AMB. Pt requires additional DME, close physical assistance, and cues for safe participate in mobility. Pt will benefit from skilled PT intervention to increase independence and safety with basic mobility in preparation for discharge to the venue listed below.       Follow Up Recommendations SNF;Supervision for mobility/OOB    Equipment Recommendations  Other (comment) (defer to facility at DC)    Recommendations for Other Services       Precautions / Restrictions Precautions Precautions: Fall Precaution Comments: CAM boot with wt bearing, space boot on foot at entry Restrictions Other Position/Activity Restrictions: WBAT in cam boot when up      Mobility  Bed Mobility Overal bed mobility: Needs Assistance Bed Mobility: Supine to Sit;Sit to Supine     Supine to sit: Min assist Sit to supine: Min assist   General bed mobility comments: trunk strength fairly good overall    Transfers Overall transfer level: Needs assistance Equipment used: Rolling walker (2 wheeled) Transfers: Sit to/from Stand Sit to Stand: Mod assist         General transfer comment: modA from standard surface, modI from elevated EOB multiple times, Rt tibial block for safety  Ambulation/Gait Ambulation/Gait assistance:  (unable to attempt due to weakness, but is able to side step today with  RW)              Stairs            Wheelchair Mobility    Modified Rankin (Stroke Patients Only)       Balance                                             Pertinent Vitals/Pain Pain Assessment: No/denies pain (soem pain in left foot with  Left ankle rotation duirn CAM boot donning)    Home Living Family/patient expects to be discharged to:: Skilled nursing facility Living Arrangements: Children;Spouse/significant other Available Help at Discharge: Available 24 hours/day Type of Home: House Home Access: Stairs to enter Entrance Stairs-Rails: Psychiatric nurse of Steps: 2 Home Layout: Able to live on main level with bedroom/bathroom Home Equipment: Walker - 2 wheels;Walker - 4 wheels;Wheelchair - [employer];Shower seat - built in;Other (comment)      Prior Function Level of Independence: Needs assistance   Gait / Transfers Assistance Needed: very limited fxl mobility in the home, sleeps in recliner, uses rollator for mobility, but dtr endorses that patient has mostly only been pivoting to a bari [facility] since May 2022. Only walks when HHPT comes 1x/wk.  ADL's / Homemaking Assistance Needed: Pt has been assisted by his daughter for LB bathing and dressing since initial fracture in April 2022. Pt was able to cook/clean and perform other IADLs. In recent months since fracture, has been relying on family assistance for IADLs as well.  Comments: pt has been functionally very limited since initial fracture, admits that he has been having to put more weight on it than he was supposed to     Hand Dominance        Extremity/Trunk Assessment   Upper Extremity Assessment Upper Extremity Assessment: Generalized weakness;Overall [facility] for tasks assessed    Lower Extremity Assessment Lower Extremity Assessment: Generalized weakness;Overall WFL for tasks assessed       Communication   Communication: No difficulties  Cognition Arousal/Alertness: Awake/alert Behavior During Therapy: WFL for tasks assessed/performed;Flat affect Overall Cognitive Status: History of cognitive impairments - at baseline                                        General Comments      Exercises      Assessment/Plan    PT Assessment Patient needs continued PT services  PT Problem List Decreased strength;Decreased range of motion;Decreased activity tolerance;Decreased balance;Decreased mobility;Decreased coordination;Decreased knowledge of use of DME;Decreased cognition;Decreased safety awareness;Decreased knowledge of precautions;Decreased skin integrity       PT Treatment Interventions DME instruction;Gait training;Functional mobility training;Therapeutic activities;Therapeutic exercise;Patient/family education;Balance training;Neuromuscular re-education;Manual techniques    PT Goals (Current goals can be found in the Care Plan section)  Acute Rehab PT Goals Patient Stated Goal: get stronger, be able to walk again PT Goal Formulation: With patient Time For Goal Achievement: 04/06/21 Potential to Achieve Goals: Fair    Frequency Min 2X/week   Barriers to discharge Inaccessible home environment      Co-evaluation               AM-PAC PT "6 Clicks" Mobility  Outcome Measure Help needed turning from your back to your side while in a flat bed without using bedrails?:  A Lot Help needed moving from lying on your back to sitting on the side of a flat bed without using bedrails?: A Lot Help needed moving to and from a bed to a chair (including a wheelchair)?: A Lot Help needed standing up from a chair using your arms (e.g., wheelchair or bedside chair)?: A Lot Help needed to walk in hospital room?: Total Help needed climbing 3-5 steps with a railing? : Total 6 Click Score: 10    End of Session Equipment Utilized During Treatment: Oxygen Activity Tolerance: Patient tolerated treatment well;No increased pain Patient left: with call bell/phone within reach;in bed;with bed alarm set (CAM rocker donned) Nurse Communication: Mobility status (remains somewhat confused, ST memory loss) PT Visit Diagnosis: Difficulty in walking, not elsewhere classified (R26.2);Other abnormalities  of gait and mobility (R26.89);Muscle weakness (generalized) (M62.81);Dizziness and giddiness (R42);History of falling (Z91.81)    Time: [id number] PT Time Calculation (min) (ACUTE ONLY): 31 min   Charges:   PT Evaluation $PT Eval Moderate Complexity: 1 Mod PT Treatments $Therapeutic Exercise: 8-22 mins      4:16 PM, 03/23/21 Etta Grandchild, PT, DPT Physical Therapist - [facility]  [phone] ([place])    [place] 03/23/2021, 4:06 PM

## 2021-03-23 NOTE — Progress Notes (Signed)
Nurse rounded on patient soon after change of shift.  Patient was non-verbal and not responsive, on BiPap.  Daughter at bedside said patient had not been responding to her.  Patient's daughter left about 2030.  Shortly before 2100, patient became alert, pulled off BiPap and demanded to know what was going on.  Nurse related that he had been found to have AMS and incoherent and was brought to the hospital.  Patient AOX3-4.  RT notified and patient was placed on 5L/[place].  Patient sats well on 5L.  Patient passed swallow eval and Dr. Hal Hope contacted regarding PO intake and evening meds.  Per Dr. Hal Hope, evening meds given except for gabapentin, which was held.  PO intake allowed, patient ate dinner.

## 2021-03-23 NOTE — Plan of Care (Signed)

## 2021-03-24 DIAGNOSIS — J9622 Acute and chronic respiratory failure with hypercapnia: Secondary | ICD-10-CM | POA: Diagnosis not present

## 2021-03-24 DIAGNOSIS — J9621 Acute and chronic respiratory failure with hypoxia: Secondary | ICD-10-CM | POA: Diagnosis not present

## 2021-03-24 LAB — CULTURE, BLOOD (ROUTINE X 2)
Culture: NO GROWTH
Culture: NO GROWTH
Special Requests: ADEQUATE

## 2021-03-24 MED ORDER — LEVALBUTEROL HCL 0.63 MG/3ML IN NEBU: 0.6300 mg | INHALATION_SOLUTION | Freq: Four times a day (QID) | RESPIRATORY_TRACT | 12 refills | Status: DC | PRN

## 2021-03-24 MED ORDER — COVID-19 MRNA VAC-TRIS(PFIZER) 30 MCG/0.3ML IM SUSP
0.3000 mL | Freq: Once | INTRAMUSCULAR | Status: AC
  Administered 2021-03-24: 0.3 mL via INTRAMUSCULAR
  Filled 2021-03-24: qty 0.3

## 2021-03-24 NOTE — TOC Transition Note (Signed)
Transition of Care [facility]) - CM/SW Discharge Note   Patient Details  Name: Kharee Honor MRN: [id number] Date of Birth: 05/17/1951  Transition of Care [facility]) CM/SW Contact:  Alberteen Sam, LCSW Phone Number: 03/24/2021, 1:47 PM   Clinical Narrative:     Patient will DC to: [place] date: 03/24/21 Family notified: [name] Transport [id number]  Per MD patient ready for DC to [facility] RN, patient, patient's family, and facility notified of DC. Discharge Summary sent to facility. RN given number for report   [phone] Room 16A. DC packet on chart. Ambulance transport requested for patient.  CSW signing off.  Pricilla Riffle, LCSW    Final next level of care: Skilled Nursing Facility Barriers to Discharge: No Barriers Identified   Patient Goals and CMS Choice   CMS Medicare.gov Compare Post Acute Care list provided to:: Patient Represenative (must comment) [name] (daughter)) Choice offered to / list presented to : Adult Children  Discharge Placement              Patient chooses bed at: [facility] Patient to be transferred to facility by: [facility] Name of family member notified: [name] Patient and family notified of of transfer: 03/24/21  Discharge Plan and Services     Post Acute Care Choice: [place]                               Social Determinants of Health (SDOH) Interventions     Readmission Risk Interventions No flowsheet data found.

## 2021-03-24 NOTE — NC FL2 (Signed)
[place] LEVEL OF CARE SCREENING TOOL     IDENTIFICATION  Patient Name: Christopher [name] Birthdate: 01-Apr-1951 Sex: male Admission Date (Current Location): 03/22/2021  [facility] and [place] Number:  Engineering geologist and Address:  [facility], [address] [place], [place] [address]      Provider Number: [id number]  Attending Physician Name and Address:  Fritzi Mandes, MD  Relative Name and Phone Number:  [name] (daughter) [phone]    Current Level of Care: Hospital Recommended Level of Care: [place] Prior Approval Number:    Date Approved/Denied:   PASRR Number: [id number]  Discharge Plan: SNF    Current Diagnoses: Patient Active Problem List   Diagnosis Date Noted   Acute on chronic respiratory failure with hypoxia and hypercapnia (HCC) Q000111Q   Acute metabolic encephalopathy Q000111Q   CKD (chronic kidney disease), stage IIIa 03/22/2021   Leukocytosis 03/22/2021   Elevated troponin 03/22/2021   Open wound of left foot 03/22/2021   Osteomyelitis of ankle or foot, acute, left ([place]) 03/17/2021   Acute on chronic respiratory failure ([place]) 03/17/2021   Osteomyelitis ([place]) 12/16/2020   Closed left ankle fracture 12/16/2020   OSA on CPAP 12/16/2020   Morbid obesity with BMI of 50.0-59.9, adult ([place]) 12/16/2020   Hyperlipidemia, mixed 12/16/2020   Depression 12/16/2020   Hypothyroid 12/16/2020   Chronic diastolic CHF (congestive heart failure) ([place]) 12/16/2020    Orientation RESPIRATION BLADDER Height & Weight     Self  O2 (2L nasal cannula) Continent Weight: (!) 367 lb 6.4 oz (166.7 kg) Height:  '5\' 11"'$  (180.3 cm)  BEHAVIORAL SYMPTOMS/MOOD NEUROLOGICAL BOWEL NUTRITION STATUS      Continent Diet (see discharge summary)  AMBULATORY STATUS COMMUNICATION OF NEEDS Skin   Extensive Assist Verbally Other (Comment) (pressure injury left ankle and toe ulcer)                       Personal Care  Assistance Level of Assistance  Bathing, Feeding, Dressing, Total care Bathing Assistance: Limited assistance Feeding assistance: Independent Dressing Assistance: Limited assistance Total Care Assistance: Maximum assistance   Functional Limitations Info  Hearing, Speech, Sight Sight Info: Impaired Hearing Info: Adequate Speech Info: Adequate    SPECIAL CARE FACTORS FREQUENCY  PT (By licensed PT), OT (By licensed OT)     PT Frequency: min 4x weekly OT Frequency: min 4x weekly            Contractures Contractures Info: Not present    Additional Factors Info  Code Status, Allergies Code Status Info: full Allergies Info: albuterol, grass extracts, cats           Current Medications (03/24/2021):  This is the current hospital active medication list Current Facility-Administered Medications  Medication Dose Route Frequency Provider Last Rate Last Admin   acetaminophen (TYLENOL) tablet 650 mg  650 mg Oral Q6H PRN Ivor Costa, MD       aspirin EC tablet 81 mg  81 mg Oral QHS Ivor Costa, MD   81 mg at 03/23/21 2155   atorvastatin (LIPITOR) tablet 20 mg  20 mg Oral QHS Ivor Costa, MD   20 mg at 03/23/21 2156   buPROPion [facility] SR) 12 hr tablet 150 mg  150 mg Oral BID WC Ivor Costa, MD   150 mg at 03/24/21 [id number]   cholecalciferol (VITAMIN D) tablet 1,000 Units  1,000 Units Oral Gordan Payment, MD   1,000 Units at 03/23/21 2155  COVID-19 mRNA Vac-TriS (Pfizer) injection 0.3 mL  0.3 mL Intramuscular Once Fritzi Mandes, MD       dextromethorphan-guaiFENesin ([place] DM) 30-600 MG per 12 hr tablet 1 tablet  1 tablet Oral BID PRN Ivor Costa, MD       enoxaparin (LOVENOX) injection 80 mg  80 mg Subcutaneous Q24H Ivor Costa, MD   80 mg at 03/23/21 2155   furosemide (LASIX) tablet 40 mg  40 mg Oral Henreitta Cea, MD   40 mg at 03/24/21 [id number]   gabapentin (NEURONTIN) capsule 300 mg  300 mg Oral TID Ivor Costa, MD   300 mg at 03/24/21 [id number]   hydrocerin (EUCERIN) cream   Topical BID Ivor Costa, MD   Given at 03/23/21 2156   ipratropium (ATROVENT) nebulizer solution 0.5 mg  0.5 mg Nebulization TID Fritzi Mandes, MD   0.5 mg at 03/24/21 0701   levalbuterol (XOPENEX) nebulizer solution 0.63 mg  0.63 mg Nebulization Q6H PRN Ivor Costa, MD       levothyroxine (SYNTHROID) tablet 88 mcg  88 mcg Oral Daily Ivor Costa, MD   88 mcg at 03/24/21 0531   multivitamin-lutein (OCUVITE-LUTEIN) capsule 1 capsule  1 capsule Oral BID Ivor Costa, MD   1 capsule at 03/24/21 [id number]   mupirocin ointment (BACTROBAN) 2 %   Topical Daily Ivor Costa, MD   Given at 03/24/21 0830   ondansetron (ZOFRAN) injection 4 mg  4 mg Intravenous Q8H PRN Ivor Costa, MD       propranolol (INDERAL) tablet 40 mg  40 mg Oral Daily Ivor Costa, MD   40 mg at 03/23/21 1622   protein supplement (ENSURE MAX) liquid  11 oz Oral BID Ivor Costa, MD   11 oz at 03/23/21 [id number]   trolamine salicylate (ASPERCREME) 10 % cream 1 application  1 application Topical PRN Ivor Costa, MD       vitamin B-12 (CYANOCOBALAMIN) tablet 1,000 mcg  1,000 mcg Oral Once per day on Mon Thu Niu, Xilin, MD   1,000 mcg at 03/23/21 1007     Discharge Medications: Please see discharge summary for a list of discharge medications.  Relevant Imaging Results:  Relevant Lab Results:   Additional Information SSN: [id number]  Alberteen Sam, LCSW

## 2021-03-24 NOTE — Discharge Instructions (Addendum)
Please make sure patient uses CPAP during afternoon naps, anytime during sleeping and SPECIALLY during nighttime along with his oxygen.  Nebulizer as needed.

## 2021-03-24 NOTE — Discharge Summary (Signed)
[facility] NAME: Christopher [name]    MR#:  [id number]  DATE OF BIRTH:  December 05, 1950  DATE OF ADMISSION:  03/22/2021 ADMITTING PHYSICIAN: Ivor Costa, MD  DATE OF DISCHARGE: 03/24/2021  PRIMARY CARE PHYSICIAN: Rusty Aus, MD    ADMISSION DIAGNOSIS:  Obesity hypoventilation syndrome ([place]) [E66.2] Acute on chronic respiratory failure with hypercapnia (HCC) [J96.22] Acute on chronic respiratory failure with hypoxia and hypercapnia (HCC) [J96.21, J96.22]  DISCHARGE DIAGNOSIS:  Acute metabolic encephalopathy secondary hypoxia and Hypercarbia via acute on chronic COPD exacerbation.  SECONDARY DIAGNOSIS:   Past Medical History:  Diagnosis Date  . Anxiety   . CHF (congestive heart failure) ([place])   . Depression   . Dysrhythmia   . Hypothyroidism   . Sleep apnea     HOSPITAL COURSE:   Christopher [name] is a 70 y.o. male with medical history significant of hyperlipidemia, hypothyroidism, depression with anxiety, obesity, OSA on CPAP, dCHF, CKD stage IIIa, status post ORIF for closed left ankle fracture, who presents with shortness of breath and altered mental status. Patient normally uses 2 L oxygen, but was found to have oxygen desaturating to 79% on room air, nonrebreather is placed, oxygen is back to 98%, but the patient is still confused. VBG with pH 7.36, CO2 87 and O2 58.  BiPAP is started in ED.   Acute on chronic respiratory failure with hypoxia and hypercapnia (HCC): acute metabolic encephalopathy suspected due to hypoxia and due to narcosis  --ABG with pH of 7.36, CO2 87, O2 58.  Patient has morbid obesity and OSA on CPAP.  Possible explanation is obstructive hypoventilation syndrome.   -- patient's mentation is back to baseline. He is recommended to continue using BiPAP here while taking naps and spoke with daughter Adonis Brook who has taken CPAP at [place]. Patient will you continue to use during daytime and afternoon naps  along with his oxygen -Bronchodilators -- CT head unremarkable --RN staff at [facility]: Please make sure patient uses CPAP during afternoon naps, anytime during sleeping and SPECIALLY during nighttime along with his oxygen.    OSA on CPAP -Currently on BiPAP --pt [facility] at [facility]   Morbid obesity with BMI of 50.0-59.9, adult ([place])   -need to encourage to lose weight.    Depression -Wellbutrin   Hypothyroid -Synthroid   Chronic diastolic CHF (congestive heart failure) ([place]): 2D echo on 05/19/2020 showed EF> 55%.  Very difficult to assess volume status due to morbid obesity --cont po lasix    CKD (chronic kidney disease), stage IIIa: Stable Baseline creatinine 1.2-1.5.  His creatinine is 1.48, BUN 21   Leukocytosis: WBC 14.3.  No fever.  No source of infection identified, likely reactive   Elevated troponin: trop 21.  Possible demand ischemia -Trend troponin -Aspirin --patient had A1c 5.0 on 03/17/2021   Open wound of left foot: Does not seem to be infected -Wound care consult appreciated--cotn dressing changes       DVT ppx:  SQ Lovenox Code Status: Full code Family Communication: Yes, patient's daughter on the phone 8/1 Disposition Plan:  Anticipate discharge back to previous environment Consults called:  none Admission status and Level of care: Progressive Cardiac:    as inpt          Status is: Inpatient    Dispo: The patient is from: SNF              Anticipated d/c is to: SNF today  Patient currently is medically optimized              difficult to place patient No       CONSULTS OBTAINED:    DRUG ALLERGIES:   Allergies  Allergen Reactions  . Albuterol Other (See Comments)    Causes afib   . Grass Extracts [Gramineae Pollens] Other (See Comments)    Sneeze and watery eyes  . Other     Other reaction(s): Other (See Comments) Unable to breath when around cats    DISCHARGE MEDICATIONS:   Allergies as of 03/24/2021       Reactions    Albuterol Other (See Comments)   Causes afib    Grass Extracts [gramineae Pollens] Other (See Comments)   Sneeze and watery eyes   Other    Other reaction(s): Other (See Comments) Unable to breath when around cats        Medication List     TAKE these medications    aspirin 81 MG EC tablet Take 81 mg by mouth at bedtime.   atorvastatin 20 MG tablet Commonly known as: LIPITOR Take 20 mg by mouth at bedtime.   buPROPion 150 MG 12 hr tablet Commonly known as: WELLBUTRIN SR Take 150 mg by mouth 2 (two) times daily. After 1st meal of day and after dinner   Cholecalciferol 25 MCG (1000 UT) capsule Take 1,000 Units by mouth at bedtime.   cyanocobalamin 1000 MCG tablet Take 1,000 mcg by mouth 2 (two) times a week. At bedtime. Sundays and Wednesdays   Ensure Max Protein Liqd Take 330 mLs (11 oz total) by mouth 2 (two) times daily.   furosemide 40 MG tablet Commonly known as: LASIX Take 1 tablet (40 mg total) by mouth every other day. After 1st meal of the day   gabapentin 300 MG capsule Commonly known as: NEURONTIN Take 300 mg by mouth 3 (three) times daily. After 1st meal of day, after dinner, and at bedtime   ibuprofen 200 MG tablet Commonly known as: ADVIL Take 400-600 mg by mouth every 6 (six) hours as needed for mild pain or moderate pain.   levalbuterol 0.63 MG/3ML nebulizer solution Commonly known as: XOPENEX Take 3 mLs (0.63 mg total) by nebulization every 6 (six) hours as needed for wheezing or shortness of breath.   levalbuterol 45 MCG/ACT inhaler Commonly known as: XOPENEX HFA Inhale 2 puffs into the lungs every 4 (four) hours as needed for wheezing.   levothyroxine 88 MCG tablet Commonly known as: SYNTHROID Take 88 mcg by mouth daily. 30 to 60 minutes before breakfast on an empty stomach and with a glass of water   mupirocin ointment 2 % Commonly known as: BACTROBAN Apply topically daily.   OXYGEN Inhale 2 L into the lungs continuous.   potassium  chloride 10 MEQ tablet Commonly known as: KLOR-CON Take 10 mEq by mouth 2 (two) times daily. After 1st meal of day and after dinner   PreserVision AREDS 2 Caps Take 1 capsule by mouth 2 (two) times daily. With food. After 1st meal of day and after dinner   propranolol 40 MG tablet Commonly known as: INDERAL Take 40 mg by mouth daily. After dinner   trolamine salicylate 10 % cream Commonly known as: ASPERCREME Apply 1 application topically as needed for muscle pain. Apply to knees and feet   vitamin A & D ointment Apply 1 application topically as needed. To groin area.  Discharge Care Instructions  (From admission, onward)           Start     Ordered   03/24/21 0000  Discharge wound care:       Comments: Wound care to left medial malleolus:  Cleanse with soap and water, rinse and pat dry. Moisturize LE with Eucerin Cream.  Cover wound with folded piece of xeroform gauze, top with dry gauze, ABD and secure with Kerlix roll gauze/paper tape wrapped from just below toes to just below knee.  Top Kerlix with ACE bandage wrapped in a similar manner. Place foot into [employer].   03/24/21 0752            If you experience worsening of your admission symptoms, develop shortness of breath, life threatening emergency, suicidal or homicidal thoughts you must seek medical attention immediately by calling 911 or calling your MD immediately  if symptoms less severe.  You Must read complete instructions/literature along with all the possible adverse reactions/side effects for all the Medicines you take and that have been prescribed to you. Take any new Medicines after you have completely understood and accept all the possible adverse reactions/side effects.   Please note  You were cared for by a hospitalist during your hospital stay. If you have any questions about your discharge medications or the care you received while you were in the hospital after you are  discharged, you can call the unit and asked to speak with the hospitalist on call if the hospitalist that took care of you is not available. Once you are discharged, your primary care physician will handle any further medical issues. Please note that NO REFILLS for any discharge medications will be authorized once you are discharged, as it is imperative that you return to your primary care physician (or establish a relationship with a primary care physician if you do not have one) for your aftercare needs so that they can reassess your need for medications and monitor your lab values. Today   SUBJECTIVE   No new complaints. Mentation back to baseline  VITAL SIGNS:  Blood pressure (!) 102/49, pulse 66, temperature 98.6 F (37 C), temperature source Oral, resp. rate 16, height '5\' 11"'$  (1.803 m), weight (!) 166.7 kg, SpO2 95 %.  I/O:   Intake/Output Summary (Last 24 hours) at 03/24/2021 0754 Last data filed at 03/24/2021 0500 Gross per 24 hour  Intake 360 ml  Output 425 ml  Net -65 ml    PHYSICAL EXAMINATION:  GENERAL:  70 y.o.-year-old patient lying in the bed with no acute distress. Morbidly obese. Chronically ill LUNGS: distant breath sounds bilaterally, no wheezing, rales, rhonchi. No use of accessory muscles of respiration. CARDIOVASCULAR: S1, S2 normal. No murmurs, rubs, or gallops. ABDOMEN: Soft, nontender, nondistended. Bowel sounds present. No organomegaly or mass. EXTREMITIES: bilateral lateral lower extremity edema chronic with chronic pressure sores NEUROLOGIC: moves all extremities well PSYCHIATRIC:  patient is alert and oriented x 3. SKIN:  Pressure Injury 03/18/21 Ankle Left;Medial Unstageable - Full thickness tissue loss in which the base of the injury is covered by slough (yellow, tan, gray, green or brown) and/or eschar (tan, brown or black) in the wound bed. (Active)  03/18/21 0000  Location: Ankle  Location Orientation: Left;Medial  Staging: Unstageable - Full thickness  tissue loss in which the base of the injury is covered by slough (yellow, tan, gray, green or brown) and/or eschar (tan, brown or black) in the wound bed.  Wound Description (Comments):  Present  on Admission: Yes      DATA REVIEW:   CBC  Recent Labs  Lab 03/23/21 0033  WBC 12.2*  HGB 12.2*  HCT 40.5  PLT 187    Chemistries  Recent Labs  Lab 03/17/21 1321 03/18/21 0550 03/23/21 0033  NA 143   < > 140  K 4.9   < > 4.3  CL 95*   < > 92*  CO2 40*   < > 40*  GLUCOSE 114*   < > 116*  BUN 31*   < > 26*  CREATININE 1.50*   < > 1.74*  CALCIUM 9.4   < > 8.4*  AST 11*  --   --   ALT 7  --   --   ALKPHOS 54  --   --   BILITOT 1.1  --   --    < > = values in this interval not displayed.    Microbiology Results   Recent Results (from the past 240 hour(s))  Culture, blood (Routine x 2)     Status: None   Collection Time: 03/17/21  1:21 PM   Specimen: BLOOD  Result Value Ref Range Status   Specimen Description BLOOD RIGTH [facility]  Final   Special Requests   Final    BOTTLES DRAWN AEROBIC AND ANAEROBIC Blood Culture results may not be optimal due to an excessive volume of blood received in culture bottles   Culture   Final    NO GROWTH 7 DAYS Performed at [facility], [address] [place], [place] [address]    Report Status 03/24/2021 FINAL  Final  Resp Panel by RT-PCR (Flu A&B, Covid) Nasopharyngeal Swab     Status: None   Collection Time: 03/17/21  1:21 PM   Specimen: Nasopharyngeal Swab; Nasopharyngeal(NP) swabs in vial transport medium  Result Value Ref Range Status   SARS Coronavirus 2 by RT PCR NEGATIVE NEGATIVE Final    Comment: (NOTE) SARS-CoV-2 target nucleic acids are NOT DETECTED.  The SARS-CoV-2 RNA is generally detectable in upper respiratory specimens during the acute phase of infection. The lowest concentration of SARS-CoV-2 viral copies this assay can detect is 138 copies/mL. A negative result does not preclude SARS-Cov-2 infection and should  not be used as the sole basis for treatment or other patient management decisions. A negative result may occur with  improper specimen collection/handling, submission of specimen other than nasopharyngeal swab, presence of viral mutation(s) within the areas targeted by this assay, and inadequate number of viral copies(<138 copies/mL). A negative result must be combined with clinical observations, patient history, and epidemiological information. The expected result is Negative.  Fact Sheet for Patients:  EntrepreneurPulse.com.au  Fact Sheet for Healthcare Providers:  IncredibleEmployment.be  This test is no t yet approved or cleared by the [place] FDA and  has been authorized for detection and/or diagnosis of SARS-CoV-2 by FDA under an Emergency Use Authorization (EUA). This EUA will remain  in effect (meaning this test can be used) for the duration of the COVID-19 declaration under Section 564(b)(1) of the Act, 21 U.S.C.section 360bbb-3(b)(1), unless the authorization is terminated  or revoked sooner.       Influenza A by PCR NEGATIVE NEGATIVE Final   Influenza B by PCR NEGATIVE NEGATIVE Final    Comment: (NOTE) The Xpert Xpress SARS-CoV-2/FLU/RSV plus assay is intended as an aid in the diagnosis of influenza from Nasopharyngeal swab specimens and should not be used as a sole basis for treatment. Nasal washings and  aspirates are unacceptable for Xpert Xpress SARS-CoV-2/FLU/RSV testing.  Fact Sheet for Patients: EntrepreneurPulse.com.au  Fact Sheet for Healthcare Providers: IncredibleEmployment.be  This test is not yet approved or cleared by the [place] FDA and has been authorized for detection and/or diagnosis of SARS-CoV-2 by FDA under an Emergency Use Authorization (EUA). This EUA will remain in effect (meaning this test can be used) for the duration of the COVID-19 declaration under  Section 564(b)(1) of the Act, 21 U.S.C. section 360bbb-3(b)(1), unless the authorization is terminated or revoked.  Performed at [facility], [address] [place], [place] [address]   Aerobic/Anaerobic Culture w Gram Stain (surgical/deep wound)     Status: None   Collection Time: 03/17/21  4:12 PM   Specimen: Wound  Result Value Ref Range Status   Specimen Description   Final    WOUND Performed at [facility], [address] [place], [place] [address]    Special Requests   Final    LEFT FOOT Performed at [facility], [place], [place] [address]    Gram Stain   Final    NO WBC SEEN FEW GRAM VARIABLE ROD RARE GRAM POSITIVE COCCI IN PAIRS Performed at [facility], [place] [address], [place], [place] [address]    Culture   Final    ABUNDANT MULTIPLE ORGANISMS PRESENT, NONE PREDOMINANT NO STAPHYLOCOCCUS AUREUS ISOLATED NO GROUP A STREP (S.PYOGENES) ISOLATED MIXED ANAEROBIC FLORA PRESENT.  CALL LAB IF FURTHER IID REQUIRED.    Report Status 03/23/2021 FINAL  Final  Culture, blood (Routine x 2)     Status: None   Collection Time: 03/17/21  5:33 PM   Specimen: BLOOD  Result Value Ref Range Status   Specimen Description BLOOD BLOOD LEFT HAND  Final   Special Requests   Final    BOTTLES DRAWN AEROBIC AND ANAEROBIC Blood Culture adequate volume   Culture   Final    NO GROWTH 7 DAYS Performed at [facility], [address] [place], [place] [address]    Report Status 03/24/2021 FINAL  Final  Culture, blood (routine x 2)     Status: None (Preliminary result)   Collection Time: 03/22/21 10:07 AM   Specimen: BLOOD  Result Value Ref Range Status   Specimen Description BLOOD RIGHT ANTECUBITAL  Final   Special Requests   Final    BOTTLES DRAWN AEROBIC AND ANAEROBIC Blood Culture adequate volume   Culture   Final    NO GROWTH 2 DAYS Performed at [facility], [address], [place], [place] [address]     Report Status PENDING  Incomplete  Resp Panel by RT-PCR (Flu A&B, Covid) Nasopharyngeal Swab     Status: None   Collection Time: 03/22/21 10:07 AM   Specimen: Nasopharyngeal Swab; Nasopharyngeal(NP) swabs in vial transport medium  Result Value Ref Range Status   SARS Coronavirus 2 by RT PCR NEGATIVE NEGATIVE Final    Comment: (NOTE) SARS-CoV-2 target nucleic acids are NOT DETECTED.  The SARS-CoV-2 RNA is generally detectable in upper respiratory specimens during the acute phase of infection. The lowest concentration of SARS-CoV-2 viral copies this assay can detect is 138 copies/mL. A negative result does not preclude SARS-Cov-2 infection and should not be used as the sole basis for treatment or other patient management decisions. A negative result may occur with  improper specimen collection/handling, submission of specimen other than nasopharyngeal swab, presence of viral mutation(s) within the areas targeted by this assay, and  inadequate number of viral copies(<138 copies/mL). A negative result must be combined with clinical observations, patient history, and epidemiological information. The expected result is Negative.  Fact Sheet for Patients:  EntrepreneurPulse.com.au  Fact Sheet for Healthcare Providers:  IncredibleEmployment.be  This test is no t yet approved or cleared by the [place] FDA and  has been authorized for detection and/or diagnosis of SARS-CoV-2 by FDA under an Emergency Use Authorization (EUA). This EUA will remain  in effect (meaning this test can be used) for the duration of the COVID-19 declaration under Section 564(b)(1) of the Act, 21 U.S.C.section 360bbb-3(b)(1), unless the authorization is terminated  or revoked sooner.       Influenza A by PCR NEGATIVE NEGATIVE Final   Influenza B by PCR NEGATIVE NEGATIVE Final    Comment: (NOTE) The Xpert Xpress SARS-CoV-2/FLU/RSV plus assay is intended as an aid in the  diagnosis of influenza from Nasopharyngeal swab specimens and should not be used as a sole basis for treatment. Nasal washings and aspirates are unacceptable for Xpert Xpress SARS-CoV-2/FLU/RSV testing.  Fact Sheet for Patients: EntrepreneurPulse.com.au  Fact Sheet for Healthcare Providers: IncredibleEmployment.be  This test is not yet approved or cleared by the [place] FDA and has been authorized for detection and/or diagnosis of SARS-CoV-2 by FDA under an Emergency Use Authorization (EUA). This EUA will remain in effect (meaning this test can be used) for the duration of the COVID-19 declaration under Section 564(b)(1) of the Act, 21 U.S.C. section 360bbb-3(b)(1), unless the authorization is terminated or revoked.  Performed at [facility], [address] [place], [place] [address]   Culture, blood (routine x 2)     Status: None (Preliminary result)   Collection Time: 03/22/21 10:08 AM   Specimen: BLOOD  Result Value Ref Range Status   Specimen Description BLOOD BLOOD RIGHT FOREARM  Final   Special Requests   Final    BOTTLES DRAWN AEROBIC AND ANAEROBIC Blood Culture results may not be optimal due to an excessive volume of blood received in culture bottles   Culture   Final    NO GROWTH 2 DAYS Performed at [facility], [address], [place], [place] [address]    Report Status PENDING  Incomplete    RADIOLOGY:  CT HEAD WO CONTRAST  Result Date: 03/23/2021 CLINICAL DATA:  70 year old male with history of mental status change. EXAM: CT HEAD WITHOUT CONTRAST TECHNIQUE: Contiguous axial images were obtained from the base of the skull through the vertex without intravenous contrast. COMPARISON:  No priors. FINDINGS: Brain: Mild cerebral atrophy. Patchy and confluent areas of decreased attenuation are noted throughout the deep and periventricular white matter of the cerebral hemispheres bilaterally, compatible with  chronic microvascular ischemic disease. No evidence of acute infarction, hemorrhage, hydrocephalus, extra-axial collection or mass lesion/mass effect. Vascular: No hyperdense vessel or unexpected calcification. Skull: Normal. Negative for fracture or focal lesion. Sinuses/Orbits: No acute finding. Other: Small right mastoid effusion. IMPRESSION: 1. No acute intracranial abnormalities. 2. Small right mastoid effusion. 3. Mild cerebral atrophy with extensive chronic microvascular ischemic changes in the cerebral white matter, as above. Electronically Signed   By: Vinnie Langton M.D.   On: 03/23/2021 08:05   DG Chest Portable 1 View  Result Date: 03/22/2021 CLINICAL DATA:  Shortness of breath EXAM: PORTABLE CHEST 1 VIEW COMPARISON:  03/17/2021 FINDINGS: Midline trachea. Normal heart size for level of inspiration. Mediastinal soft tissue fullness is likely technique related in this obese patient. No pleural effusion or pneumothorax. Pulmonary  interstitial prominence is also favored to be technique related and partially secondary to low lung volumes. No congestive failure. There may be mild left base subsegmental atelectasis. IMPRESSION: Cardiomegaly and low lung volumes, without acute disease. Electronically Signed   By: Abigail Miyamoto M.D.   On: 03/22/2021 10:29     CODE STATUS:     Code Status Orders  (From admission, onward)           Start     Ordered   03/22/21 1432  Full code  Continuous        03/22/21 1431           Code Status History     Date Active Date Inactive Code Status Order ID Comments User Context   03/17/2021 [id number] 03/20/2021 2158 Full Code [id number]  Collier Bullock, MD ED   12/16/2020 1725 12/22/2020 2140 Full Code [id number]  Cox, Amy Delane Ginger, DO ED        TOTAL TIME TAKING CARE OF THIS PATIENT: 35 minutes.    Fritzi Mandes M.D  [facility]    CC: Primary care physician; Rusty Aus, MD

## 2021-03-24 NOTE — Progress Notes (Signed)
Report called to [place] at [facility].

## 2021-03-26 ENCOUNTER — Telehealth (INDEPENDENT_AMBULATORY_CARE_PROVIDER_SITE_OTHER): Payer: Self-pay | Admitting: Vascular Surgery

## 2021-03-26 NOTE — Telephone Encounter (Signed)
[facility] called to set patient 1 week f/u, but let [facility] know that patient only has a 1 week f/u with Emily Filbert in Internal Medicine.

## 2021-03-27 LAB — CULTURE, BLOOD (ROUTINE X 2)
Culture: NO GROWTH
Culture: NO GROWTH
Special Requests: ADEQUATE

## 2021-03-30 ENCOUNTER — Emergency Department
Admission: EM | Admit: 2021-03-30 | Discharge: 2021-03-30 | Disposition: A | Discharge status: Home / Self Care | Payer: Medicare Other | Attending: Emergency Medicine | Admitting: Emergency Medicine

## 2021-03-30 ENCOUNTER — Emergency Department: Payer: Medicare Other

## 2021-03-30 DIAGNOSIS — R0902 Hypoxemia: Secondary | ICD-10-CM | POA: Diagnosis present

## 2021-03-30 DIAGNOSIS — N183 Chronic kidney disease, stage 3 unspecified: Secondary | ICD-10-CM | POA: Diagnosis not present

## 2021-03-30 DIAGNOSIS — G4739 Other sleep apnea: Secondary | ICD-10-CM | POA: Insufficient documentation

## 2021-03-30 DIAGNOSIS — E039 Hypothyroidism, unspecified: Secondary | ICD-10-CM | POA: Insufficient documentation

## 2021-03-30 DIAGNOSIS — Z79899 Other long term (current) drug therapy: Secondary | ICD-10-CM | POA: Diagnosis not present

## 2021-03-30 DIAGNOSIS — I5032 Chronic diastolic (congestive) heart failure: Secondary | ICD-10-CM | POA: Insufficient documentation

## 2021-03-30 DIAGNOSIS — Z7982 Long term (current) use of aspirin: Secondary | ICD-10-CM | POA: Insufficient documentation

## 2021-03-30 LAB — BASIC METABOLIC PANEL
Anion gap: 5 (ref 5–15)
BUN: 30 mg/dL — ABNORMAL HIGH (ref 8–23)
CO2: 41 mmol/L — ABNORMAL HIGH (ref 22–32)
Calcium: 8.8 mg/dL — ABNORMAL LOW (ref 8.9–10.3)
Chloride: 96 mmol/L — ABNORMAL LOW (ref 98–111)
Creatinine, Ser: 1.49 mg/dL — ABNORMAL HIGH (ref 0.61–1.24)
GFR, Estimated: 50 mL/min — ABNORMAL LOW (ref >60)
Glucose, Bld: 100 mg/dL — ABNORMAL HIGH (ref 70–99)
Potassium: 5.1 mmol/L (ref 3.5–5.1)
Sodium: 142 mmol/L (ref 135–145)

## 2021-03-30 LAB — CBC WITH DIFFERENTIAL/PLATELET
Abs Immature Granulocytes: 0.07 10*3/uL (ref 0.00–0.07)
Basophils Absolute: 0.1 10*3/uL (ref 0.0–0.1)
Basophils Relative: 1 %
Eosinophils Absolute: 0.6 10*3/uL — ABNORMAL HIGH (ref 0.0–0.5)
Eosinophils Relative: 10 %
HCT: 42.3 % (ref 39.0–52.0)
Hemoglobin: 12.5 g/dL — ABNORMAL LOW (ref 13.0–17.0)
Immature Granulocytes: 1 %
Lymphocytes Relative: 17 %
Lymphs Abs: 0.9 10*3/uL (ref 0.7–4.0)
MCH: 28.3 pg (ref 26.0–34.0)
MCHC: 29.6 g/dL — ABNORMAL LOW (ref 30.0–36.0)
MCV: 95.7 fL (ref 80.0–100.0)
Monocytes Absolute: 0.7 10*3/uL (ref 0.1–1.0)
Monocytes Relative: 12 %
Neutro Abs: 3.3 10*3/uL (ref 1.7–7.7)
Neutrophils Relative %: 59 %
Platelets: 238 10*3/uL (ref 150–400)
RBC: 4.42 MIL/uL (ref 4.22–5.81)
RDW: 14.7 % (ref 11.5–15.5)
WBC: 5.6 10*3/uL (ref 4.0–10.5)
nRBC: 0 % (ref 0.0–0.2)

## 2021-03-30 NOTE — Discharge Instructions (Addendum)
The patient's chest x-ray and blood work as well as EKG were normal.  He should wear the CPAP at night and when napping.  He also needs to be sitting up in bed which is likely contributing to his desaturation.

## 2021-03-30 NOTE — ED Provider Notes (Signed)
[facility]  ____________________________________________   Event Date/Time   First MD Initiated Contact with Patient 03/30/21 1756     (approximate)  I have reviewed the triage vital signs and the nursing notes.   HISTORY  Chief Complaint Respiratory Distress (Pt presents from [facility] for episode of apnea. EMS reports pt O2 Sat in the 70s during this episode. Pt arrives alert and oriented x 4 with O2 99% on 2L. )    HPI Christopher [name] is a 70 y.o. male with past medical history of obesity hypoventilation syndrome, CKD, CHF, sleep apnea on CPAP, hypoxic respiratory failure on 2 L nasal cannula at baseline who presents after an episode of hypoxia at his health care center.  Patient was in his normal state of health when nurses found him horizontal in the bed and confused.  His oxygen saturation was in the 60s and his heart rate was in the 40s.  He was lifted up into the bed with minimal improvement.  Per EMS his oxygen remained stable on 4 L nasal cannula.  The patient denies symptoms at this time.  He denies cough, shortness of breath, fevers or chills.  He denies chest pain increase lower extremity edema, nausea vomiting or abdominal pain.        Past Medical History:  Diagnosis Date   Anxiety    CHF (congestive heart failure) ([place])    Depression    Dysrhythmia    Hypothyroidism    Sleep apnea     Patient Active Problem List   Diagnosis Date Noted   Acute on chronic respiratory failure with hypoxia and hypercapnia ([place]) Q000111Q   Acute metabolic encephalopathy Q000111Q   CKD (chronic kidney disease), stage IIIa 03/22/2021   Leukocytosis 03/22/2021   Elevated troponin 03/22/2021   Open wound of left foot 03/22/2021   Osteomyelitis of ankle or foot, acute, left ([place]) 03/17/2021   Acute on chronic respiratory failure ([place]) 03/17/2021   Osteomyelitis ([place]) 12/16/2020   Closed left ankle fracture 12/16/2020   OSA on CPAP  12/16/2020   Morbid obesity with BMI of 50.0-59.9, adult ([place]) 12/16/2020   Hyperlipidemia, mixed 12/16/2020   Depression 12/16/2020   Hypothyroid 12/16/2020   Chronic diastolic CHF (congestive heart failure) ([place]) 12/16/2020    Past Surgical History:  Procedure Laterality Date   FRACTURE SURGERY     ORIF ANKLE FRACTURE Left 12/17/2020   Procedure: OPEN REDUCTION INTERNAL FIXATION (ORIF) ANKLE FRACTURE;  Surgeon: Samara Deist, DPM;  Location: [facility] ORS;  Service: Podiatry;  Laterality: Left;    Prior to Admission medications   Medication Sig Start Date End Date Taking? Authorizing Provider  aspirin 81 MG EC tablet Take 81 mg by mouth at bedtime.    [provider]  atorvastatin (LIPITOR) 20 MG tablet Take 20 mg by mouth at bedtime. 10/06/20   [provider]  buPROPion (WELLBUTRIN SR) 150 MG 12 hr tablet Take 150 mg by mouth 2 (two) times daily. After 1st meal of day and after dinner 12/02/20   [provider]  Cholecalciferol 25 MCG (1000 UT) capsule Take 1,000 Units by mouth at bedtime.    [provider]  cyanocobalamin 1000 MCG tablet Take 1,000 mcg by mouth 2 (two) times a week. At bedtime. Sundays and Wednesdays    [provider]  Ensure Max Protein (ENSURE MAX PROTEIN) LIQD Take 330 mLs (11 oz total) by mouth 2 (two) times daily. 03/19/21   Lorella Nimrod, MD  furosemide (  LASIX) 40 MG tablet Take 1 tablet (40 mg total) by mouth every other day. After 1st meal of the day 03/19/21   Lorella Nimrod, MD  gabapentin (NEURONTIN) 300 MG capsule Take 300 mg by mouth 3 (three) times daily. After 1st meal of day, after dinner, and at bedtime 04/07/20   [provider]  ibuprofen (ADVIL) 200 MG tablet Take 400-600 mg by mouth every 6 (six) hours as needed for mild pain or moderate pain.    [provider]  levalbuterol Penne Lash HFA) 45 MCG/ACT inhaler Inhale 2 puffs into the lungs every 4 (four) hours as needed for wheezing.    [provider]  levalbuterol Penne Lash) 0.63 MG/3ML nebulizer solution Take 3 mLs (0.63 mg total) by nebulization every 6 (six) hours as needed for wheezing or shortness of breath. 03/24/21   Fritzi Mandes, MD  levothyroxine (SYNTHROID) 88 MCG tablet Take 88 mcg by mouth daily. 30 to 60 minutes before breakfast on an empty stomach and with a glass of water 12/02/20   [provider]  Multiple Vitamins-Minerals (PRESERVISION AREDS 2) CAPS Take 1 capsule by mouth 2 (two) times daily. With food. After 1st meal of day and after dinner    [provider]  mupirocin ointment (BACTROBAN) 2 % Apply topically daily. 03/19/21   Lorella Nimrod, MD  OXYGEN Inhale 2 L into the lungs continuous.    [provider]  potassium chloride (KLOR-CON) 10 MEQ tablet Take 10 mEq by mouth 2 (two) times daily. After 1st meal of day and after dinner 10/21/20   [provider]  propranolol (INDERAL) 40 MG tablet Take 40 mg by mouth daily. After dinner 01/01/21   [provider]  trolamine salicylate (ASPERCREME) 10 % cream Apply 1 application topically as needed for muscle pain. Apply to knees and feet    [provider]  Vitamins A & D (VITAMIN A & D) ointment Apply 1 application topically as needed. To groin area.    [provider]    Allergies Albuterol, Grass extracts [gramineae pollens], and Other  Family History  Problem Relation Age of Onset   Hypertension Mother     Social History Social History   Tobacco Use   Smoking status: Never   Smokeless tobacco: Never  Vaping Use   Vaping Use: Never used  Substance Use Topics   Alcohol use: Not Currently   Drug use: Never    Review of Systems   Review of Systems  Constitutional:  Negative for chills, fatigue and fever.  Respiratory:  Negative for shortness of breath.   Cardiovascular:  Negative for chest pain.  Gastrointestinal:  Negative for abdominal pain, nausea and vomiting.  Neurological:  Negative  for light-headedness.  Psychiatric/Behavioral:  Negative for confusion.   All other systems reviewed and are negative.  Physical Exam Updated Vital Signs BP 114/87 (BP Location: Right Arm)   Pulse 71   Temp 98.3 F (36.8 C) (Oral)   Resp 18   Ht '5\' 11"'$  (1.803 m)   Wt (!) 166 kg   SpO2 99%   BMI 51.04 kg/m   Physical Exam Vitals and nursing note reviewed.  Constitutional:      General: He is not in acute distress.    Appearance: Normal appearance.     Comments: Morbidly obese  HENT:     Head: Normocephalic and atraumatic.  Eyes:     General: No scleral icterus.    Conjunctiva/sclera: Conjunctivae normal.  Cardiovascular:  Rate and Rhythm: Normal rate and regular rhythm.  Pulmonary:     Effort: Pulmonary effort is normal. No respiratory distress.     Breath sounds: Normal breath sounds. No wheezing.     Comments: Lung sounds are clear Abdominal:     General: There is no distension.     Tenderness: There is no guarding.  Musculoskeletal:        General: Swelling present. No deformity or signs of injury.     Cervical back: Normal range of motion.     Comments: Bilateral lower extremity swelling with chronic venous stasis changes  Skin:    Coloration: Skin is not jaundiced or pale.  Neurological:     General: No focal deficit present.     Mental Status: He is alert and oriented to person, place, and time. Mental status is at baseline.  Psychiatric:        Mood and Affect: Mood normal.        Behavior: Behavior normal.     LABS (all labs ordered are listed, but only abnormal results are displayed)  Labs Reviewed  CBC WITH DIFFERENTIAL/PLATELET - Abnormal; Notable for the following components:      Result Value   Hemoglobin 12.5 (*)    MCHC 29.6 (*)    Eosinophils Absolute 0.6 (*)    All other components within normal limits  BASIC METABOLIC PANEL - Abnormal; Notable for the following components:   Chloride 96 (*)    CO2 41 (*)    Glucose, Bld 100 (*)     BUN 30 (*)    Creatinine, Ser 1.49 (*)    Calcium 8.8 (*)    GFR, Estimated 50 (*)    All other components within normal limits   ____________________________________________  EKG  Normal axis, normal sinus rhythm, normal intervals, no ST or T wave changes to suggest ischemia ____________________________________________  RADIOLOGY I, Madelin Headings, personally viewed and evaluated these images (plain radiographs) as part of my medical decision making, as well as reviewing the written report by the radiologist.  ED MD interpretation:  I reviewed the     ____________________________________________   PROCEDURES  Procedure(s) performed (including Critical Care):  Procedures   ____________________________________________   INITIAL IMPRESSION / ASSESSMENT AND PLAN / ED COURSE     The patient is a 70 year old male past medical history of obesity hypoventilation syndrome and chronic respiratory failure, sleep apnea on CPAP who presents after an episode of hypoxia at his facility.  In talking with the nurse at his facility patient had been sliding down in the bed frequently, they then found him appearing cyanotic and confused and when they checked his pulse ox it was in the 60s.  At the time he was lying flat in the bed and his neck was flexed.  They pulled him up in bed and his oxygen did not seem to improve.  However during transport he maintained normal pulse ox.  On my evaluation he is on his baseline 2 L nasal cannula satting 98% with no increased work of breathing no tachypnea and clear lung sounds.  The patient is asymptomatic.  He is alert and oriented x3, not confused.  I suspect that this episode was related to his positioning and possibly that he had fallen asleep while not being on CPAP.  Will check chest x-ray and basic labs however I do not feel that extensive work-up is necessary nor do I feel like a VBG will be of use given he  is not clinically hypercarbic and is likely  hypercarbic chronically.  Patient was observed in the ED for period of around 3 hours and did not have any episodes of hypoxia.  He remained lucid.  He was discharged back to the facility.  I spoke with his daughter who notes that they have actually been taking off the CPAP because he has some desaturation events.  I discussed that if anything he may need more oxygen with the CPAP however he needs the positive pressure and that he should be on this at all times when sleeping or napping.      ____________________________________________   FINAL CLINICAL IMPRESSION(S) / ED DIAGNOSES  Final diagnoses:  Hypoxia     ED Discharge Orders     None        Note:  This document was prepared using Dragon voice recognition software and may include unintentional dictation errors.    Rada Hay, MD 03/31/21 [phone]

## 2021-03-30 NOTE — ED Notes (Signed)
Called [facility] to transport patient back to [facility] @ 8:32pm

## 2021-04-03 ENCOUNTER — Ambulatory Visit (INDEPENDENT_AMBULATORY_CARE_PROVIDER_SITE_OTHER): Payer: Medicare Other | Admitting: Nurse Practitioner

## 2021-04-03 ENCOUNTER — Other Ambulatory Visit (INDEPENDENT_AMBULATORY_CARE_PROVIDER_SITE_OTHER): Payer: Self-pay | Admitting: Vascular Surgery

## 2021-04-03 ENCOUNTER — Telehealth (INDEPENDENT_AMBULATORY_CARE_PROVIDER_SITE_OTHER): Payer: Self-pay | Admitting: Vascular Surgery

## 2021-04-03 DIAGNOSIS — R0989 Other specified symptoms and signs involving the circulatory and respiratory systems: Secondary | ICD-10-CM

## 2021-04-03 DIAGNOSIS — S91302A Unspecified open wound, left foot, initial encounter: Secondary | ICD-10-CM

## 2021-04-03 NOTE — Telephone Encounter (Signed)
Spoke with pt's spouse. She repeatedly stated that he wascoming in for an angio. I explained that he was only coming to see Eulogio Ditch this morning. 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 When they call back, please schedule for an abi + consult. See jd/fb.   As far as the angio is concerned, I iwll speak with jd/fb and see what needs to be done there.

## 2021-04-06 ENCOUNTER — Encounter (INDEPENDENT_AMBULATORY_CARE_PROVIDER_SITE_OTHER): Payer: Self-pay | Admitting: Nurse Practitioner

## 2021-04-06 ENCOUNTER — Other Ambulatory Visit: Payer: Self-pay

## 2021-04-06 ENCOUNTER — Ambulatory Visit (INDEPENDENT_AMBULATORY_CARE_PROVIDER_SITE_OTHER): Payer: Medicare Other

## 2021-04-06 ENCOUNTER — Ambulatory Visit (INDEPENDENT_AMBULATORY_CARE_PROVIDER_SITE_OTHER): Payer: Medicare Other | Admitting: Nurse Practitioner

## 2021-04-06 VITALS — BP 115/64 | HR 71 | Ht 71.0 in | Wt 369.0 lb

## 2021-04-06 DIAGNOSIS — R0989 Other specified symptoms and signs involving the circulatory and respiratory systems: Secondary | ICD-10-CM

## 2021-04-06 DIAGNOSIS — S91302A Unspecified open wound, left foot, initial encounter: Secondary | ICD-10-CM

## 2021-04-06 DIAGNOSIS — E782 Mixed hyperlipidemia: Secondary | ICD-10-CM

## 2021-04-06 DIAGNOSIS — I10 Essential (primary) hypertension: Secondary | ICD-10-CM

## 2021-04-06 NOTE — Progress Notes (Signed)
Subjective:    Patient ID: Christopher [name], male    DOB: [birth year], 70 y.o.   MRN: [id number] No chief complaint on file.   Christopher [name] is a 70 year old male that presents today for follow-up evaluation after being seen in the emergency room for left foot wound and concern for peripheral arterial disease.  Per his consultation notes, patient had an ankle fracture in April and had surgery to his left ankle.  According to the daughter he has had limited mobility since after his surgery.  He was initially able to ambulate with a rolling walker but now the daughter states that it is very difficult for him to do that.  At that time the patient did not have easily palpable pedal pulses.  Today the patient has a right ABI of 1.16 and a left of 1.31.  The patient has a TBI of 0.92 on the right and 0.97 on the left.  The patient has triphasic tibial artery waveforms bilaterally with good toe waveforms bilaterally.  There is no evidence of significant peripheral arterial disease bilaterally.   Review of Systems  Cardiovascular:  Positive for leg swelling.  Skin:  Positive for wound.  Neurological:  Positive for weakness.  All other systems reviewed and are negative.     Objective:   Physical Exam Vitals reviewed.  Constitutional:      General: He is sleeping.     Appearance: He is obese.  HENT:     Head: Normocephalic.  Cardiovascular:     Rate and Rhythm: Normal rate.     Comments: Unable to palpate pedal pulses due to boot Pulmonary:     Effort: Pulmonary effort is normal.  Skin:    General: Skin is warm and dry.  Neurological:     Motor: Weakness present.     Gait: Gait abnormal.  Psychiatric:        Mood and Affect: Mood normal.        Behavior: Behavior normal.        Thought Content: Thought content normal.        Judgment: Judgment normal.    BP 115/64 (BP Location: Left Arm)   Pulse 71   Ht '5\' 11"'$  (1.803 m)   Wt (!) 369 lb (167.4 kg)   BMI 51.47 kg/m   Past  Medical History:  Diagnosis Date   Anxiety    CHF (congestive heart failure) (H[facility])    Depression    Dysrhythmia    Hypothyroidism    Sleep apnea     Social History   Socioeconomic History   Marital status: Married    Spouse name: Not on file   Number of children: Not on file   Years of education: Not on file   Highest education level: Not on file  Occupational History   Not on file  Tobacco Use   Smoking status: Never   Smokeless tobacco: Never  Vaping Use   Vaping Use: Never used  Substance and Sexual Activity   Alcohol use: Not Currently   Drug use: Never   Sexual activity: Not Currently  Other Topics Concern   Not on file  Social History Narrative   Not on file   Social Determinants of Health   Financial Resource Strain: Not on file  Food Insecurity: Not on file  Transportation Needs: Not on file  Physical Activity: Not on file  Stress: Not on file  Social Connections: Not on file  Intimate Partner Violence: Not on file  Past Surgical History:  Procedure Laterality Date   FRACTURE SURGERY     ORIF ANKLE FRACTURE Left 12/17/2020   Procedure: OPEN REDUCTION INTERNAL FIXATION (ORIF) ANKLE FRACTURE;  Surgeon: Samara Deist, DPM;  Location: [facility] ORS;  Service: Podiatry;  Laterality: Left;    Family History  Problem Relation Age of Onset   Hypertension Mother     Allergies  Allergen Reactions   Albuterol Other (See Comments)    Causes afib    Gramineae Pollens Other (See Comments)    Sneeze and watery eyes Sneeze and watery eyes   Other Other (See Comments)    Other reaction(s): Other (See Comments) Unable to breath when around cats Other reaction(s): Other (See Comments) Unable to breath when around cats    CBC Latest Ref Rng & Units 03/30/2021 03/23/2021 03/22/2021  WBC 4.0 - 10.5 K/uL 5.6 12.2(H) 14.3(H)  Hemoglobin 13.0 - 17.0 g/dL 12.5(L) 12.2(L) 14.1  Hematocrit 39.0 - 52.0 % 42.3 40.5 47.2  Platelets 150 - 400 K/uL 238 187 201       CMP     Component Value Date/Time   NA 142 03/30/2021 1755   K 5.1 03/30/2021 1755   CL 96 (L) 03/30/2021 1755   CO2 41 (H) 03/30/2021 1755   GLUCOSE 100 (H) 03/30/2021 1755   BUN 30 (H) 03/30/2021 1755   CREATININE 1.49 (H) 03/30/2021 1755   CALCIUM 8.8 (L) 03/30/2021 1755   PROT 7.5 03/17/2021 1321   ALBUMIN 3.9 03/17/2021 1321   AST 11 (L) 03/17/2021 1321   ALT 7 03/17/2021 1321   ALKPHOS 54 03/17/2021 1321   BILITOT 1.1 03/17/2021 1321   GFRNONAA 50 (L) 03/30/2021 1755     No results found.     Assessment & Plan:   1. Open wound of left foot, initial encounter Today noninvasive studies show no evidence of peripheral arterial disease.  Based on the noninvasive studies today, the patient should have adequate ability for wound healing.  The patient is not entirely certain if the wound care facility is treating his current wounds therefore we will refer the patient over to the wound care center for wound evaluation and treatment plan.  The patient will follow-up with [place] on an as-needed basis. - Ambulatory referral to Wound Clinic  2. Benign essential hypertension Continue antihypertensive medications as already ordered, these medications have been reviewed and there are no changes at this time.   3. Hyperlipidemia, mixed Continue statin as ordered and reviewed, no changes at this time    Current Outpatient Medications on File Prior to Visit  Medication Sig Dispense Refill   aspirin 81 MG EC tablet Take 81 mg by mouth at bedtime.     atorvastatin (LIPITOR) 20 MG tablet Take 20 mg by mouth at bedtime.     buPROPion (WELLBUTRIN SR) 150 MG 12 hr tablet Take 150 mg by mouth 2 (two) times daily. After 1st meal of day and after dinner     Cholecalciferol 25 MCG (1000 UT) capsule Take 1,000 Units by mouth at bedtime.     cyanocobalamin 1000 MCG tablet Take 1,000 mcg by mouth 2 (two) times a week. At bedtime. Sundays and Wednesdays     Ensure Max Protein (ENSURE MAX  PROTEIN) LIQD Take 330 mLs (11 oz total) by mouth 2 (two) times daily. 330 mL 12   furosemide (LASIX) 40 MG tablet Take 1 tablet (40 mg total) by mouth every other day. After 1st meal of the day  gabapentin (NEURONTIN) 300 MG capsule Take 300 mg by mouth 3 (three) times daily. After 1st meal of day, after dinner, and at bedtime     ibuprofen (ADVIL) 200 MG tablet Take 400-600 mg by mouth every 6 (six) hours as needed for mild pain or moderate pain.     levalbuterol (XOPENEX HFA) 45 MCG/ACT inhaler Inhale 2 puffs into the lungs every 4 (four) hours as needed for wheezing.     levalbuterol (XOPENEX) 0.63 MG/3ML nebulizer solution Take 3 mLs (0.63 mg total) by nebulization every 6 (six) hours as needed for wheezing or shortness of breath. 3 mL 12   levothyroxine (SYNTHROID) 88 MCG tablet Take 88 mcg by mouth daily. 30 to 60 minutes before breakfast on an empty stomach and with a glass of water     Multiple Vitamins-Minerals (PRESERVISION AREDS 2) CAPS Take 1 capsule by mouth 2 (two) times daily. With food. After 1st meal of day and after dinner     mupirocin ointment (BACTROBAN) 2 % Apply topically daily. 22 g 0   OXYGEN Inhale 2 L into the lungs continuous.     potassium chloride (KLOR-CON) 10 MEQ tablet Take 10 mEq by mouth 2 (two) times daily. After 1st meal of day and after dinner     propranolol (INDERAL) 40 MG tablet Take 40 mg by mouth daily. After dinner     trolamine salicylate (ASPERCREME) 10 % cream Apply 1 application topically as needed for muscle pain. Apply to knees and feet     Vitamins A & D (VITAMIN A & D) ointment Apply 1 application topically as needed. To groin area.     No current facility-administered medications on file prior to visit.    There are no Patient Instructions on file for this visit. No follow-ups on file.   Kris Hartmann, NP

## 2021-04-13 ENCOUNTER — Other Ambulatory Visit: Payer: Self-pay

## 2021-04-13 ENCOUNTER — Encounter: Payer: Medicare Other | Attending: Physician Assistant | Admitting: Physician Assistant

## 2021-04-13 DIAGNOSIS — L97528 Non-pressure chronic ulcer of other part of left foot with other specified severity: Secondary | ICD-10-CM | POA: Diagnosis present

## 2021-04-13 DIAGNOSIS — I890 Lymphedema, not elsewhere classified: Secondary | ICD-10-CM | POA: Diagnosis not present

## 2021-04-13 DIAGNOSIS — Z888 Allergy status to other drugs, medicaments and biological substances status: Secondary | ICD-10-CM | POA: Insufficient documentation

## 2021-04-13 DIAGNOSIS — N19 Unspecified kidney failure: Secondary | ICD-10-CM | POA: Insufficient documentation

## 2021-04-13 DIAGNOSIS — I739 Peripheral vascular disease, unspecified: Secondary | ICD-10-CM | POA: Insufficient documentation

## 2021-04-13 DIAGNOSIS — I509 Heart failure, unspecified: Secondary | ICD-10-CM | POA: Diagnosis not present

## 2021-04-13 DIAGNOSIS — I110 Hypertensive heart disease with heart failure: Secondary | ICD-10-CM | POA: Insufficient documentation

## 2021-04-15 NOTE — Progress Notes (Signed)
Christopher [name], Christopher [name] ([id number]) Visit Report for 04/13/2021 Chief Complaint Document Details Patient Name: Christopher [name], Christopher [name] Date of Service: 04/13/2021 8:45 AM Medical Record Number: [id number] Patient Account Number: [id number] Date of Birth/Sex: 01-Feb-1951 (70 y.o. M) Treating RN: [name] Primary Care Provider: Emily Filbert Other Clinician: Referring Provider: Eulogio Ditch Treating Provider/Extender: [name] in Treatment: 0 Information Obtained from: Patient Chief Complaint Evaluation for left toe ulcer. Healed on initial inspection today. Electronic Signature(s) Signed: 04/13/2021 10:15:52 AM By: Worthy Keeler PA-C Entered By: Worthy Keeler on 04/13/2021 10:15:52 Varden, Christopher [name] ([id number]) -------------------------------------------------------------------------------- HPI Details Patient Name: Christopher [name] Date of Service: 04/13/2021 8:45 AM Medical Record Number: [id number] Patient Account Number: [id number] Date of Birth/Sex: [birth year] (70 y.o. M) Treating RN: [name] Primary Care Provider: Emily Filbert Other Clinician: Referring Provider: Eulogio Ditch Treating Provider/Extender: [name] in Treatment: 0 History of Present Illness HPI Description: 04/13/2021 upon evaluation today patient presents for initial evaluation here in our clinic concerning a wound that he was having on his left third toe. He has been seen by vascular good news is that his arterial studies actually were very good with his ABI on the right being 1.16 with a TBI of 0.92 and on the left 1.31 with a TBI of 0.97. There was no evidence of significant arterial disease on either extremity. This is great news. With that being said the patient's wound actually appears to be completely healed there is really not much for me to do from a wound care perspective. This is great news as well. I did review his x-ray which showed that there was some questionable subtle erosion at the base of  the left fifth metatarsal. Again this is nowhere near where the wound was even situated and likely is just artifact. Either way it does not relate to anything from a wound care perspective and again the patient does see podiatry tomorrow who has been following him since his ankle surgery in April. Electronic Signature(s) Signed: 04/13/2021 10:25:43 AM By: Worthy Keeler PA-C Entered By: Worthy Keeler on 04/13/2021 10:25:42 Christopher [name], Christopher [name] ([id number]) -------------------------------------------------------------------------------- Physical Exam Details Patient Name: Christopher [name] Date of Service: 04/13/2021 8:45 AM Medical Record Number: [id number] Patient Account Number: [id number] Date of Birth/Sex: 1951-08-16 (70 y.o. M) Treating RN: [name] Primary Care Provider: Emily Filbert Other Clinician: Referring Provider: Eulogio Ditch Treating Provider/Extender: [name] in Treatment: 0 Constitutional patient is hypertensive.. pulse regular and within target range for patient.Marland Kitchen respirations regular, non-labored and within target range for patient.Marland Kitchen temperature within target range for patient.. Well-nourished and well-hydrated in no acute distress. Eyes conjunctiva clear no eyelid edema noted. pupils equal round and reactive to light and accommodation. Ears, Nose, Mouth, and Throat no gross abnormality of ear auricles or external auditory canals. normal hearing noted during conversation. mucus membranes moist. Respiratory normal breathing without difficulty. Cardiovascular 2+ dorsalis pedis/posterior tibialis pulses. Patient has bilateral stage II lymphedema.Marland Kitchen Psychiatric this patient is able to make decisions and demonstrates good insight into disease process. Alert and Oriented x 3. pleasant and cooperative. Notes Upon inspection patient's wound actually showed signs of complete epithelization and I see no evidence of an open wound at this point there is really nothing  needs to be done from a dressing standpoint. I see no signs of any infection at this point which is also great news. Overall I think that the patient is doing decently well today. He is still having issues with pain in his ankle that is from  the broken ankle and surgery subsequently but again there is really nothing much to do from our perspective at this point. Electronic Signature(s) Signed: 04/13/2021 10:26:26 AM By: Worthy Keeler PA-C Entered By: Worthy Keeler on 04/13/2021 10:26:26 Christopher [name], Christopher [name] ([id number]) -------------------------------------------------------------------------------- Physician Orders Details Patient Name: Christopher [name] Date of Service: 04/13/2021 8:45 AM Medical Record Number: [id number] Patient Account Number: [id number] Date of Birth/Sex: 1951/01/10 (69 y.o. M) Treating RN: [name] Primary Care Provider: Emily Filbert Other Clinician: Referring Provider: Eulogio Ditch Treating Provider/Extender: [name] in Treatment: 0 Verbal / Phone Orders: No Diagnosis Coding Discharge From [facility] Only - apply lotion daily Electronic Signature(s) Signed: 04/13/2021 5:15:29 PM By: Worthy Keeler PA-C Signed: 04/15/2021 9:51:49 AM By: [name] RN Entered By: [name] on 04/13/2021 09:32:24 Christopher [name], Christopher [name] ([id number]) -------------------------------------------------------------------------------- Problem List Details Patient Name: Christopher [name] Date of Service: 04/13/2021 8:45 AM Medical Record Number: [id number] Patient Account Number: [id number] Date of Birth/Sex: April 13, 1951 (70 y.o. M) Treating RN: [name] Primary Care Provider: Emily Filbert Other Clinician: Referring Provider: Eulogio Ditch Treating Provider/Extender: [name] in Treatment: 0 Active Problems ICD-10 Encounter Code Description Active Date MDM Diagnosis I89.0 Lymphedema, not elsewhere classified 04/13/2021 No Yes L97.528 Non-pressure chronic  ulcer of other part of left foot with other specified 04/13/2021 No Yes severity Inactive Problems Resolved Problems Electronic Signature(s) Signed: 04/13/2021 10:15:03 AM By: Worthy Keeler PA-C Entered By: Worthy Keeler on 04/13/2021 10:15:02 Christopher [name], Christopher [name] ([id number]) -------------------------------------------------------------------------------- Progress Note Details Patient Name: Christopher [name] Date of Service: 04/13/2021 8:45 AM Medical Record Number: [id number] Patient Account Number: [id number] Date of Birth/Sex: 1951/04/12 (69 y.o. M) Treating RN: [name] Primary Care Provider: Emily Filbert Other Clinician: Referring Provider: Eulogio Ditch Treating Provider/Extender: [name] in Treatment: 0 Subjective Chief Complaint Information obtained from Patient Evaluation for left toe ulcer. Healed on initial inspection today. History of Present Illness (HPI) 04/13/2021 upon evaluation today patient presents for initial evaluation here in our clinic concerning a wound that he was having on his left third toe. He has been seen by vascular good news is that his arterial studies actually were very good with his ABI on the right being 1.16 with a TBI of 0.92 and on the left 1.31 with a TBI of 0.97. There was no evidence of significant arterial disease on either extremity. This is great news. With that being said the patient's wound actually appears to be completely healed there is really not much for me to do from a wound care perspective. This is great news as well. I did review his x-ray which showed that there was some questionable subtle erosion at the base of the left fifth metatarsal. Again this is nowhere near where the wound was even situated and likely is just artifact. Either way it does not relate to anything from a wound care perspective and again the patient does see podiatry tomorrow who has been following him since his ankle surgery in April. Patient  History Information obtained from Patient. Allergies albuterol Social History Never smoker, Marital Status - Married, Alcohol Use - Rarely, Drug Use - No History, Caffeine Use - Rarely. Medical History Cardiovascular Patient has history of Congestive Heart Failure, Hypertension, Peripheral Arterial Disease Review of Systems (ROS) Eyes Complains or has symptoms of Vision Changes. Genitourinary Complains or has symptoms of Kidney failure/ Dialysis. Integumentary (Skin) Complains or has symptoms of Wounds. Objective Constitutional patient is hypertensive.. pulse regular and within target range for patient.Marland Kitchen respirations  regular, non-labored and within target range for patient.Marland Kitchen temperature within target range for patient.. Well-nourished and well-hydrated in no acute distress. Vitals Time Taken: 9:03 AM, Height: 71 in, Source: Stated, Weight: 369 lbs, Source: Stated, BMI: 51.5, Temperature: 98 F, Pulse: 64 bpm, Respiratory Rate: 18 breaths/min, Blood Pressure: 143/69 mmHg. Eyes conjunctiva clear no eyelid edema noted. pupils equal round and reactive to light and accommodation. Ears, Nose, Mouth, and Throat no gross abnormality of ear auricles or external auditory canals. normal hearing noted during conversation. mucus membranes moist. Respiratory normal breathing without difficulty. Christopher [name], Christopher [name] ([id number]) Cardiovascular 2+ dorsalis pedis/posterior tibialis pulses. Patient has bilateral stage II lymphedema.Marland Kitchen Psychiatric this patient is able to make decisions and demonstrates good insight into disease process. Alert and Oriented x 3. pleasant and cooperative. General Notes: Upon inspection patient's wound actually showed signs of complete epithelization and I see no evidence of an open wound at this point there is really nothing needs to be done from a dressing standpoint. I see no signs of any infection at this point which is also great news. Overall I think that the patient is  doing decently well today. He is still having issues with pain in his ankle that is from the broken ankle and surgery subsequently but again there is really nothing much to do from our perspective at this point. Assessment Active Problems ICD-10 Lymphedema, not elsewhere classified Non-pressure chronic ulcer of other part of left foot with other specified severity Plan Discharge From [facility] Services: Consult Only - apply lotion daily 1. At this point it does not appear the patient is going require wound care currently. I think that what was present on the third toe was actually completely healed which is great news. Overall I am pleased in that regard and I think that the patient is doing excellent. He just needs to continue to monitor and ensure that there is nothing that would breakdown or worsen in the interim. 2. With regard to the follow-up I think we will just see him back for follow-up visit as needed just be dependent on whether or not anything worsens or changes. Right now the biggest issue that I see is just simply that he is having trouble with his ankle he is really not able to get up and walk around as well as he should be able to since the surgery in April. He sees podiatry tomorrow. We will see him back for follow-up visit as needed. Electronic Signature(s) Signed: 04/13/2021 10:27:39 AM By: Worthy Keeler PA-C Entered By: Worthy Keeler on 04/13/2021 10:27:39 Christopher [name], Christopher [name] ([id number]) -------------------------------------------------------------------------------- ROS/PFSH Details Patient Name: Christopher [name] Date of Service: 04/13/2021 8:45 AM Medical Record Number: [id number] Patient Account Number: [id number] Date of Birth/Sex: 03-04-1951 (69 y.o. M) Treating RN: [name] Primary Care Provider: Emily Filbert Other Clinician: Referring Provider: Eulogio Ditch Treating Provider/Extender: [name] in Treatment: 0 Information Obtained  From Patient Eyes Complaints and Symptoms: Positive for: Vision Changes Genitourinary Complaints and Symptoms: Positive for: Kidney failure/ Dialysis Integumentary (Skin) Complaints and Symptoms: Positive for: Wounds Cardiovascular Medical History: Positive for: Congestive Heart Failure; Hypertension; Peripheral Arterial Disease Immunizations Pneumococcal Vaccine: Received Pneumococcal Vaccination: No Implantable Devices None Family and Social History Never smoker; Marital Status - Married; Alcohol Use: Rarely; Drug Use: No History; Caffeine Use: Rarely; Financial Concerns: No; Food, Clothing or Shelter Needs: No; Support System Lacking: No; Transportation Concerns: No Electronic Signature(s) Signed: 04/13/2021 5:15:29 PM By: Worthy Keeler PA-C Signed: 04/15/2021 9:51:49 AM By: [name]  RN Entered By: [name] on 04/13/2021 09:10:13 Christopher [name], Christopher [name] ([id number]) -------------------------------------------------------------------------------- SuperBill Details Patient Name: Christopher [name] Date of Service: 04/13/2021 Medical Record Number: [id number] Patient Account Number: [id number] Date of Birth/Sex: Dec 21, 1950 (70 y.o. M) Treating RN: [name] Primary Care Provider: Emily Filbert Other Clinician: Referring Provider: Eulogio Ditch Treating Provider/Extender: [name] in Treatment: 0 Diagnosis Coding ICD-10 Codes Code Description I89.0 Lymphedema, not elsewhere classified L97.528 Non-pressure chronic ulcer of other part of left foot with other specified severity Facility Procedures CPT4 Code: [id number] Description: [id number] - WOUND CARE VISIT-LEV 3 EST PT Modifier: Quantity: 1 Physician Procedures CPT4 Code: [id number] Description: WC PHYS LEVEL 3 o NEW PT Modifier: Quantity: 1 CPT4 Code: Description: ICD-10 Diagnosis Description I89.0 Lymphedema, not elsewhere classified L97.528 Non-pressure chronic ulcer of other part of left foot with other  speci Modifier: fied severity Quantity: Electronic Signature(s) Signed: 04/13/2021 10:27:56 AM By: Worthy Keeler PA-C Entered By: Worthy Keeler on 04/13/2021 10:27:55

## 2021-04-15 NOTE — Progress Notes (Signed)
Christopher [name] ([id number]) Visit Report for 04/13/2021 Allergy List Details Patient Name: Christopher [name], Christopher [name] Date of Service: 04/13/2021 8:45 AM Medical Record Number: [id number] Patient Account Number: [id number] Date of Birth/Sex: 12/06/1950 (70 y.o. M) Treating RN: Christopher [name] Primary Care Christopher [name]: Christopher [name] Other Clinician: Referring Christopher [name]: Christopher [name] Treating Christopher [name]/Extender: Christopher [name] in Treatment: 0 Allergies Active Allergies albuterol Allergy Notes Electronic Signature(s) Signed: 04/15/2021 9:51:49 AM By: Christopher Coria RN Entered By: Christopher [name] on 04/13/2021 09:06:36 Haslet, Christopher [name] ([id number]) -------------------------------------------------------------------------------- Arrival Information Details Patient Name: Christopher [name] Date of Service: 04/13/2021 8:45 AM Medical Record Number: [id number] Patient Account Number: [id number] Date of Birth/Sex: 1950/12/26 (69 y.o. M) Treating RN: Christopher [name] Primary Care Christopher [name]: Christopher [name] Other Clinician: Referring Christopher [name]: Christopher [name] Treating Christopher [name]/Extender: Christopher [name] in Treatment: 0 Visit Information Patient Arrived: Wheel Chair Arrival Time: 08:55 Accompanied By: daughter Transfer Assistance: None Patient Identification Verified: Yes Secondary Verification Process Completed: Yes Patient Requires Transmission-Based Precautions: No Patient Has Alerts: No Electronic Signature(s) Signed: 04/15/2021 9:51:49 AM By: Christopher Coria RN Entered By: Christopher [name] on 04/13/2021 08:58:21 Christopher [name] ([id number]) -------------------------------------------------------------------------------- Clinic Level of Care Assessment Details Patient Name: Christopher [name] Date of Service: 04/13/2021 8:45 AM Medical Record Number: [id number] Patient Account Number: [id number] Date of Birth/Sex: [birth year] (70 y.o. M) Treating RN: Christopher [name] Primary Care Eboney Claybrook: Christopher [name] Other Clinician: Referring  Christopher [name]: Christopher [name] Treating Christopher [name]/Extender: Christopher [name] in Treatment: 0 Clinic Level of Care Assessment Items TOOL 2 Quantity Score X - Use when only an EandM is performed on the INITIAL visit 1 0 ASSESSMENTS - Nursing Assessment / Reassessment X - General Physical Exam (combine w/ comprehensive assessment (listed just below) when performed on new 1 20 pt. evals) X- 1 25 Comprehensive Assessment (HX, ROS, Risk Assessments, Wounds Hx, etc.) ASSESSMENTS - Wound and Skin Assessment / Reassessment '[]'$  - Simple Wound Assessment / Reassessment - one wound 0 '[]'$  - 0 Complex Wound Assessment / Reassessment - multiple wounds '[]'$  - 0 Dermatologic / Skin Assessment (not related to wound area) ASSESSMENTS - Ostomy and/or Continence Assessment and Care '[]'$  - Incontinence Assessment and Management 0 '[]'$  - 0 Ostomy Care Assessment and Management (repouching, etc.) PROCESS - Coordination of Care X - Simple Patient / Family Education for ongoing care 1 15 '[]'$  - 0 Complex (extensive) Patient / Family Education for ongoing care X- 1 10 Staff obtains Programmer, systems, Records, Test Results / Process Orders '[]'$  - 0 Staff telephones HHA, Nursing Homes / Clarify orders / etc '[]'$  - 0 Routine Transfer to another Facility (non-emergent condition) '[]'$  - 0 Routine Hospital Admission (non-emergent condition) X- 1 15 New Admissions / Biomedical engineer / Ordering NPWT, Apligraf, etc. '[]'$  - 0 Emergency Hospital Admission (emergent condition) X- 1 10 Simple Discharge Coordination '[]'$  - 0 Complex (extensive) Discharge Coordination PROCESS - Special Needs '[]'$  - Pediatric / Minor Patient Management 0 '[]'$  - 0 Isolation Patient Management '[]'$  - 0 Hearing / Language / Visual special needs '[]'$  - 0 Assessment of Community assistance (transportation, D/C planning, etc.) '[]'$  - 0 Additional assistance / Altered mentation '[]'$  - 0 Support Surface(s) Assessment (bed, cushion, seat, etc.) INTERVENTIONS - Wound  Cleansing / Measurement '[]'$  - Wound Imaging (photographs - any number of wounds) 0 '[]'$  - 0 Wound Tracing (instead of photographs) '[]'$  - 0 Simple Wound Measurement - one wound '[]'$  - 0 Complex Wound Measurement - multiple wounds Christopher [name] ([id number]) '[]'$  - 0 Simple Wound Cleansing - one wound '[]'$  - 0 Complex Wound Cleansing -  multiple wounds INTERVENTIONS - Wound Dressings '[]'$  - Small Wound Dressing one or multiple wounds 0 '[]'$  - 0 Medium Wound Dressing one or multiple wounds '[]'$  - 0 Large Wound Dressing one or multiple wounds '[]'$  - 0 Application of Medications - injection INTERVENTIONS - Miscellaneous '[]'$  - External ear exam 0 '[]'$  - 0 Specimen Collection (cultures, biopsies, blood, body fluids, etc.) '[]'$  - 0 Specimen(s) / Culture(s) sent or taken to Lab for analysis '[]'$  - 0 Patient Transfer (multiple staff / Christopher [name] / Similar devices) '[]'$  - 0 Simple Staple / Suture removal (25 or less) '[]'$  - 0 Complex Staple / Suture removal (26 or more) '[]'$  - 0 Hypo / Hyperglycemic Management (close monitor of Blood Glucose) '[]'$  - 0 Ankle / Brachial Index (ABI) - do not check if billed separately Has the patient been seen at the hospital within the last three years: Yes Total Score: 95 Level Of Care: New/Established - Level 3 Electronic Signature(s) Signed: 04/15/2021 9:51:49 AM By: Christopher Coria RN Entered By: Christopher [name] on 04/13/2021 09:35:22 Christopher [name] ([id number]) -------------------------------------------------------------------------------- Encounter Discharge Information Details Patient Name: Christopher [name] Date of Service: 04/13/2021 8:45 AM Medical Record Number: [id number] Patient Account Number: [id number] Date of Birth/Sex: 04/30/1951 (69 y.o. M) Treating RN: Christopher [name] Primary Care Alverto Shedd: Christopher [name] Other Clinician: Referring Christopher [name]: Christopher [name] Treating Christopher [name]/Extender: Christopher [name] in Treatment: 0 Encounter Discharge Information Items Discharge  Condition: Stable Ambulatory Status: Wheelchair Discharge Destination: [place] Telephoned: No Orders Sent: Yes Transportation: Other Accompanied By: daughter Schedule Follow-up Appointment: Yes Clinical Summary of Care: Patient Declined Electronic Signature(s) Signed: 04/15/2021 9:51:49 AM By: Christopher Coria RN Entered By: Christopher [name] on 04/13/2021 09:36:28 Repass, Joeseph ([id number]) -------------------------------------------------------------------------------- Lower Extremity Assessment Details Patient Name: Christopher [name] Date of Service: 04/13/2021 8:45 AM Medical Record Number: [id number] Patient Account Number: [id number] Date of Birth/Sex: 08-Apr-1951 (69 y.o. M) Treating RN: Christopher [name] Primary Care Elijah Phommachanh: Christopher [name] Other Clinician: Referring Ernie Sagrero: Christopher [name] Treating Ngan Qualls/Extender: Christopher [name] in Treatment: 0 Vascular Assessment Pulses: Dorsalis Pedis Palpable: [Left:Yes] Electronic Signature(s) Signed: 04/15/2021 9:51:49 AM By: Christopher Coria RN Entered By: Christopher [name] on 04/13/2021 09:20:21 Howard, [place] ([id number]) -------------------------------------------------------------------------------- Multi Wound Chart Details Patient Name: Christopher [name] Date of Service: 04/13/2021 8:45 AM Medical Record Number: [id number] Patient Account Number: [id number] Date of Birth/Sex: Jun 11, 1951 (69 y.o. M) Treating RN: Christopher [name] Primary Care Horst Ostermiller: Christopher [name] Other Clinician: Referring Laelani Vasko: Christopher [name] Treating Semaja Lymon/Extender: Christopher [name] in Treatment: 0 Vital Signs Height(in): 71 Pulse(bpm): 64 Weight(lbs): 369 Blood Pressure(mmHg): 143/69 Body Mass Index(BMI): 51 Temperature(F): 98 Respiratory Rate(breaths/min): 18 Wound Assessments Treatment Notes Electronic Signature(s) Signed: 04/15/2021 9:51:49 AM By: Christopher Coria RN Entered By: Christopher [name] on 04/13/2021 09:31:56 Clarington, Milbern  ([id number]) -------------------------------------------------------------------------------- Multi-Disciplinary Care Plan Details Patient Name: Christopher [name] Date of Service: 04/13/2021 8:45 AM Medical Record Number: [id number] Patient Account Number: [id number] Date of Birth/Sex: [birth year] (69 y.o. M) Treating RN: Christopher [name] Primary Care Evy Lutterman: Christopher [name] Other Clinician: Referring Alline Pio: Christopher [name] Treating Avie Checo/Extender: Christopher [name] in Treatment: 0 Active Inactive Electronic Signature(s) Signed: 04/15/2021 9:51:49 AM By: Christopher Coria RN Entered By: Christopher [name] on 04/13/2021 09:31:39 Sedalia, Frederico ([id number]) -------------------------------------------------------------------------------- Pain Assessment Details Patient Name: Christopher [name] Date of Service: 04/13/2021 8:45 AM Medical Record Number: [id number] Patient Account Number: [id number] Date of Birth/Sex: [birth year] (69 y.o. M) Treating RN: Christopher [name] Primary Care Tabria Steines: Christopher [name] Other Clinician: Referring Damisha Wolff: Christopher [name] Treating Vennesa Bastedo/Extender: Christopher [name] in Treatment: 0 Active Problems Location  of Pain Severity and Description of Pain Patient Has Paino Yes Site Locations With Dressing Change: Yes Duration of the Pain. Constant / Intermittento Intermittent How Long Does it Lasto Hours: Minutes: 15 Rate the pain. Current Pain Level: 2 Worst Pain Level: 10 Least Pain Level: 0 Tolerable Pain Level: 5 Character of Pain Describe the Pain: Burning Pain Management and Medication Current Pain Management: Medication: Yes Cold Application: No Rest: Yes Massage: No Activity: No T.E.N.S.: No Heat Application: No Leg drop or elevation: No Is the Current Pain Management Adequate: Inadequate How does your wound impact your activities of daily livingo Sleep: Yes Bathing: No Appetite: No Relationship With Others: No Bladder Continence: No Emotions: No Bowel  Continence: No Work: No Toileting: No Drive: No Dressing: No Hobbies: No Electronic Signature(s) Signed: 04/15/2021 9:51:49 AM By: Christopher Coria RN Entered By: Christopher [name] on 04/13/2021 09:03:13 Markarian, Jill ([id number]) -------------------------------------------------------------------------------- Patient/Caregiver Education Details Patient Name: Christopher [name] Date of Service: 04/13/2021 8:45 AM Medical Record Number: [id number] Patient Account Number: [id number] Date of Birth/Gender: 09-12-1950 (70 y.o. M) Treating RN: Christopher [name] Primary Care Physician: Christopher [name] Other Clinician: Referring Physician: Eulogio [name] Treating Physician/Extender: Christopher [name] in Treatment: 0 Education Assessment Education Provided To: Caregiver Education Topics Provided Wound/Skin Impairment: Methods: Explain/Verbal Responses: State content correctly Electronic Signature(s) Signed: 04/15/2021 9:51:49 AM By: Christopher Coria RN Entered By: Christopher [name] on 04/13/2021 09:35:42 Mclin, [place] ([id number]) -------------------------------------------------------------------------------- Vitals Details Patient Name: Christopher [name] Date of Service: 04/13/2021 8:45 AM Medical Record Number: [id number] Patient Account Number: [id number] Date of Birth/Sex: May 10, 1951 (70 y.o. M) Treating RN: Christopher [name] Primary Care Deyana Wnuk: Christopher [name] Other Clinician: Referring Leandra Vanderweele: Christopher [name] Treating Micala Saltsman/Extender: Christopher [name] in Treatment: 0 Vital Signs Time Taken: 09:03 Temperature (F): 98 Height (in): 71 Pulse (bpm): 64 Source: Stated Respiratory Rate (breaths/min): 18 Weight (lbs): 369 Blood Pressure (mmHg): 143/69 Source: Stated Reference Range: 80 - 120 mg / dl Body Mass Index (BMI): 51.5 Electronic Signature(s) Signed: 04/15/2021 9:51:49 AM By: Christopher Coria RN Entered By: Christopher [name] on 04/13/2021 09:04:02

## 2021-04-15 NOTE — Progress Notes (Signed)
Jeffey, Weidert Jedi ([id number]) Visit Report for 04/13/2021 Abuse/Suicide Risk Screen Details Patient Name: Christopher [name], MCCAUGHEY Date of Service: 04/13/2021 8:45 AM Medical Record Number: [id number] Patient Account Number: [id number] Date of Birth/Sex: 1951-04-10 (70 y.o. M) Treating RN: [name] Primary Care Shikita Vaillancourt: Emily Filbert Other Clinician: Referring Raiza Kiesel: Eulogio Ditch Treating Yan Okray/Extender: [name] in Treatment: 0 Abuse/Suicide Risk Screen Items Answer ABUSE RISK SCREEN: Has anyone close to you tried to hurt or harm you recentlyo No Do you feel uncomfortable with anyone in your familyo No Has anyone forced you do things that you didnot want to doo No Electronic Signature(s) Signed: 04/15/2021 9:51:49 AM By: [name] RN Entered By: [name] on 04/13/2021 09:10:32 Jordan, Segundo ([id number]) -------------------------------------------------------------------------------- Activities of Daily Living Details Patient Name: Christopher [name] Date of Service: 04/13/2021 8:45 AM Medical Record Number: [id number] Patient Account Number: [id number] Date of Birth/Sex: 1950-08-26 (70 y.o. M) Treating RN: [name] Primary Care Jerard Bays: Emily Filbert Other Clinician: Referring Nathanyal Ashmead: Eulogio Ditch Treating Pebbles Zeiders/Extender: [name] in Treatment: 0 Activities of Daily Living Items Answer Activities of Daily Living (Please select one for each item) Drive Automobile Not Able Take Medications Need Assistance Use Telephone Need Assistance Care for Appearance Need Assistance Use Toilet Not Able Bath / Shower Not Able Dress Self Not Able Feed Self Completely Able Walk Not Able Get In / Out Bed Not Able Housework Not Able Prepare Meals Not Able Handle Money Not Able Shop for Self Not Able Electronic Signature(s) Signed: 04/15/2021 9:51:49 AM By: [name] RN Entered By: [name] on 04/13/2021 09:11:45 Mandato, Samel  ([id number]) -------------------------------------------------------------------------------- Education Screening Details Patient Name: Christopher [name] Date of Service: 04/13/2021 8:45 AM Medical Record Number: [id number] Patient Account Number: [id number] Date of Birth/Sex: 08-Apr-1951 (69 y.o. M) Treating RN: [name] Primary Care Susen Haskew: Emily Filbert Other Clinician: Referring Hayslee Casebolt: Eulogio Ditch Treating Carita Sollars/Extender: [name] in Treatment: 0 Primary Learner Assessed: Patient Learning Preferences/Education Level/Primary Language Learning Preference: Explanation Highest Education Level: College or Above Preferred Language: English Cognitive Barrier Language Barrier: No Translator Needed: No Memory Deficit: No Emotional Barrier: No Cultural/Religious Beliefs Affecting Medical Care: No Physical Barrier Impaired Vision: Yes Glasses Impaired Hearing: Yes Hearing Aid Decreased Hand dexterity: No Knowledge/Comprehension Knowledge Level: Medium Comprehension Level: High Ability to understand written instructions: High Ability to understand verbal instructions: High Motivation Anxiety Level: Anxious Cooperation: Cooperative Education Importance: Acknowledges Need Interest in Health Problems: Asks Questions Perception: Coherent Willingness to Engage in Self-Management High Activities: Readiness to Engage in Self-Management High Activities: Electronic Signature(s) Signed: 04/15/2021 9:51:49 AM By: [name] RN Entered By: [name] on 04/13/2021 09:12:22 [place], Divon ([id number]) -------------------------------------------------------------------------------- Fall Risk Assessment Details Patient Name: Christopher [name] Date of Service: 04/13/2021 8:45 AM Medical Record Number: [id number] Patient Account Number: [id number] Date of Birth/Sex: June 02, 1951 (69 y.o. M) Treating RN: [name] Primary Care Rynn Markiewicz: Emily Filbert Other  Clinician: Referring Keela Rubert: Eulogio Ditch Treating Tarun Patchell/Extender: [name] in Treatment: 0 Fall Risk Assessment Items Have you had 2 or more falls in the last 12 monthso 0 Yes Have you had any fall that resulted in injury in the last 12 monthso 0 Yes FALLS RISK SCREEN History of falling - immediate or within 3 months 25 Yes Secondary diagnosis (Do you have 2 or more medical diagnoseso) 15 Yes Ambulatory aid None/bed rest/wheelchair/nurse 0 Yes Crutches/cane/walker 0 No Furniture 0 No Intravenous therapy Access/Saline/Heparin Lock 0 No Gait/Transferring Normal/ bed rest/ wheelchair 0 Yes Weak (short steps with or without shuffle, stooped but  able to lift head while walking, may 0 No seek support from furniture) Impaired (short steps with shuffle, may have difficulty arising from chair, head down, impaired 0 No balance) Mental Status Oriented to own ability 0 No Electronic Signature(s) Signed: 04/15/2021 9:51:49 AM By: [name] RN Entered By: [name] on 04/13/2021 09:12:59 Eastep, Dayven ([id number]) -------------------------------------------------------------------------------- Foot Assessment Details Patient Name: Christopher [name] Date of Service: 04/13/2021 8:45 AM Medical Record Number: [id number] Patient Account Number: [id number] Date of Birth/Sex: 19-Mar-1951 (69 y.o. M) Treating RN: [name] Primary Care Burgundy Matuszak: Emily Filbert Other Clinician: Referring Arthi Mcdonald: Eulogio Ditch Treating Terril Chestnut/Extender: [name] in Treatment: 0 Foot Assessment Items Site Locations + = Sensation present, - = Sensation absent, C = Callus, U = Ulcer R = Redness, W = Warmth, M = Maceration, PU = Pre-ulcerative lesion F = Fissure, S = Swelling, D = Dryness Assessment Right: Left: Other Deformity: No No Prior Foot Ulcer: No No Prior Amputation: No No Charcot Joint: No No Ambulatory Status: Gait: Electronic Signature(s) Signed: 04/15/2021 9:51:49  AM By: [name] RN Entered By: [name] on 04/13/2021 09:19:54 Allen, Durell ([id number]) -------------------------------------------------------------------------------- Nutrition Risk Screening Details Patient Name: Loni Dolly Nhan Date of Service: 04/13/2021 8:45 AM Medical Record Number: [id number] Patient Account Number: [id number] Date of Birth/Sex: Apr 08, 1951 (69 y.o. M) Treating RN: [name] Primary Care Tanmay Halteman: Emily Filbert Other Clinician: Referring Reeya Bound: Eulogio Ditch Treating Ephrata Verville/Extender: [name] in Treatment: 0 Height (in): 71 Weight (lbs): 369 Body Mass Index (BMI): 51.5 Nutrition Risk Screening Items Score Screening NUTRITION RISK SCREEN: I have an illness or condition that made me change the kind and/or amount of food I eat 0 No I eat fewer than two meals per day 0 No I eat few fruits and vegetables, or milk products 0 No I have three or more drinks of beer, liquor or wine almost every day 0 No I have tooth or mouth problems that make it hard for me to eat 0 No I don't always have enough money to buy the food I need 0 No I eat alone most of the time 0 No I take three or more different prescribed or over-the-counter drugs a day 1 Yes Without wanting to, I have lost or gained 10 pounds in the last six months 0 No I am not always physically able to shop, cook and/or feed myself 2 Yes Nutrition Protocols Good Risk Protocol Moderate Risk Protocol 0 Provide education on nutrition High Risk Proctocol Risk Level: Moderate Risk Score: 3 Electronic Signature(s) Signed: 04/15/2021 9:51:49 AM By: [name] RN Entered By: [name] on 04/13/2021 09:13:24

## 2021-04-20 ENCOUNTER — Emergency Department: Payer: Medicare Other

## 2021-04-20 ENCOUNTER — Other Ambulatory Visit: Payer: Self-pay

## 2021-04-20 ENCOUNTER — Inpatient Hospital Stay
Admission: EM | Admit: 2021-04-20 | Discharge: 2021-04-29 | DRG: 291 | Disposition: A | Discharge status: Skilled Nursing Facility | Payer: Medicare Other | Source: Skilled Nursing Facility | Attending: Internal Medicine | Admitting: Internal Medicine

## 2021-04-20 DIAGNOSIS — F32A Depression, unspecified: Secondary | ICD-10-CM | POA: Diagnosis present

## 2021-04-20 DIAGNOSIS — Z6841 Body Mass Index (BMI) 40.0 and over, adult: Secondary | ICD-10-CM

## 2021-04-20 DIAGNOSIS — N1831 Chronic kidney disease, stage 3a: Secondary | ICD-10-CM | POA: Diagnosis present

## 2021-04-20 DIAGNOSIS — E872 Acidosis: Secondary | ICD-10-CM | POA: Diagnosis present

## 2021-04-20 DIAGNOSIS — I959 Hypotension, unspecified: Secondary | ICD-10-CM | POA: Diagnosis present

## 2021-04-20 DIAGNOSIS — Z20822 Contact with and (suspected) exposure to covid-19: Secondary | ICD-10-CM | POA: Diagnosis present

## 2021-04-20 DIAGNOSIS — N179 Acute kidney failure, unspecified: Secondary | ICD-10-CM

## 2021-04-20 DIAGNOSIS — I5032 Chronic diastolic (congestive) heart failure: Secondary | ICD-10-CM

## 2021-04-20 DIAGNOSIS — Z9119 Patient's noncompliance with other medical treatment and regimen: Secondary | ICD-10-CM

## 2021-04-20 DIAGNOSIS — E662 Morbid (severe) obesity with alveolar hypoventilation: Secondary | ICD-10-CM | POA: Diagnosis present

## 2021-04-20 DIAGNOSIS — L8990 Pressure ulcer of unspecified site, unspecified stage: Secondary | ICD-10-CM | POA: Insufficient documentation

## 2021-04-20 DIAGNOSIS — T502X5A Adverse effect of carbonic-anhydrase inhibitors, benzothiadiazides and other diuretics, initial encounter: Secondary | ICD-10-CM | POA: Diagnosis present

## 2021-04-20 DIAGNOSIS — Z8249 Family history of ischemic heart disease and other diseases of the circulatory system: Secondary | ICD-10-CM

## 2021-04-20 DIAGNOSIS — E039 Hypothyroidism, unspecified: Secondary | ICD-10-CM | POA: Diagnosis present

## 2021-04-20 DIAGNOSIS — G4730 Sleep apnea, unspecified: Secondary | ICD-10-CM | POA: Diagnosis present

## 2021-04-20 DIAGNOSIS — G4733 Obstructive sleep apnea (adult) (pediatric): Secondary | ICD-10-CM | POA: Diagnosis present

## 2021-04-20 DIAGNOSIS — J9621 Acute and chronic respiratory failure with hypoxia: Secondary | ICD-10-CM | POA: Diagnosis present

## 2021-04-20 DIAGNOSIS — L89222 Pressure ulcer of left hip, stage 2: Secondary | ICD-10-CM | POA: Diagnosis present

## 2021-04-20 DIAGNOSIS — Z9981 Dependence on supplemental oxygen: Secondary | ICD-10-CM

## 2021-04-20 DIAGNOSIS — I10 Essential (primary) hypertension: Secondary | ICD-10-CM | POA: Diagnosis present

## 2021-04-20 DIAGNOSIS — I130 Hypertensive heart and chronic kidney disease with heart failure and stage 1 through stage 4 chronic kidney disease, or unspecified chronic kidney disease: Principal | ICD-10-CM | POA: Diagnosis present

## 2021-04-20 DIAGNOSIS — J9622 Acute and chronic respiratory failure with hypercapnia: Secondary | ICD-10-CM | POA: Diagnosis present

## 2021-04-20 DIAGNOSIS — J449 Chronic obstructive pulmonary disease, unspecified: Secondary | ICD-10-CM | POA: Diagnosis present

## 2021-04-20 DIAGNOSIS — Z79899 Other long term (current) drug therapy: Secondary | ICD-10-CM

## 2021-04-20 DIAGNOSIS — J9600 Acute respiratory failure, unspecified whether with hypoxia or hypercapnia: Secondary | ICD-10-CM | POA: Diagnosis not present

## 2021-04-20 DIAGNOSIS — I5033 Acute on chronic diastolic (congestive) heart failure: Secondary | ICD-10-CM | POA: Diagnosis present

## 2021-04-20 DIAGNOSIS — L89322 Pressure ulcer of left buttock, stage 2: Secondary | ICD-10-CM | POA: Diagnosis present

## 2021-04-20 DIAGNOSIS — E538 Deficiency of other specified B group vitamins: Secondary | ICD-10-CM | POA: Diagnosis present

## 2021-04-20 DIAGNOSIS — E875 Hyperkalemia: Secondary | ICD-10-CM | POA: Diagnosis present

## 2021-04-20 DIAGNOSIS — N189 Chronic kidney disease, unspecified: Secondary | ICD-10-CM

## 2021-04-20 DIAGNOSIS — F419 Anxiety disorder, unspecified: Secondary | ICD-10-CM | POA: Diagnosis present

## 2021-04-20 DIAGNOSIS — R627 Adult failure to thrive: Secondary | ICD-10-CM | POA: Diagnosis present

## 2021-04-20 DIAGNOSIS — E785 Hyperlipidemia, unspecified: Secondary | ICD-10-CM | POA: Diagnosis present

## 2021-04-20 LAB — BLOOD GAS, ARTERIAL
Acid-Base Excess: 12.3 mmol/L — ABNORMAL HIGH (ref 0.0–2.0)
Bicarbonate: 42.2 mmol/L — ABNORMAL HIGH (ref 20.0–28.0)
FIO2: 0.36
O2 Saturation: 93.6 %
Patient temperature: 37
pCO2 arterial: 80 mmHg (ref 32.0–48.0)
pH, Arterial: 7.33 — ABNORMAL LOW (ref 7.350–7.450)
pO2, Arterial: 74 mmHg — ABNORMAL LOW (ref 83.0–108.0)

## 2021-04-20 LAB — CBC WITH DIFFERENTIAL/PLATELET
Abs Immature Granulocytes: 0.04 10*3/uL (ref 0.00–0.07)
Basophils Absolute: 0.1 10*3/uL (ref 0.0–0.1)
Basophils Relative: 1 %
Eosinophils Absolute: 0.4 10*3/uL (ref 0.0–0.5)
Eosinophils Relative: 7 %
HCT: 48.7 % (ref 39.0–52.0)
Hemoglobin: 14.1 g/dL (ref 13.0–17.0)
Immature Granulocytes: 1 %
Lymphocytes Relative: 30 %
Lymphs Abs: 1.7 10*3/uL (ref 0.7–4.0)
MCH: 27.9 pg (ref 26.0–34.0)
MCHC: 29 g/dL — ABNORMAL LOW (ref 30.0–36.0)
MCV: 96.2 fL (ref 80.0–100.0)
Monocytes Absolute: 1.1 10*3/uL — ABNORMAL HIGH (ref 0.1–1.0)
Monocytes Relative: 20 %
Neutro Abs: 2.2 10*3/uL (ref 1.7–7.7)
Neutrophils Relative %: 41 %
Platelets: 223 10*3/uL (ref 150–400)
RBC: 5.06 MIL/uL (ref 4.22–5.81)
RDW: 16.5 % — ABNORMAL HIGH (ref 11.5–15.5)
WBC: 5.5 10*3/uL (ref 4.0–10.5)
nRBC: 0 % (ref 0.0–0.2)

## 2021-04-20 LAB — RESP PANEL BY RT-PCR (FLU A&B, COVID) ARPGX2
Influenza A by PCR: NEGATIVE
Influenza B by PCR: NEGATIVE
SARS Coronavirus 2 by RT PCR: NEGATIVE

## 2021-04-20 LAB — COMPREHENSIVE METABOLIC PANEL
ALT: 10 U/L (ref 0–44)
AST: 16 U/L (ref 15–41)
Albumin: 3 g/dL — ABNORMAL LOW (ref 3.5–5.0)
Alkaline Phosphatase: 46 U/L (ref 38–126)
Anion gap: 4 — ABNORMAL LOW (ref 5–15)
BUN: 34 mg/dL — ABNORMAL HIGH (ref 8–23)
CO2: 38 mmol/L — ABNORMAL HIGH (ref 22–32)
Calcium: 8.8 mg/dL — ABNORMAL LOW (ref 8.9–10.3)
Chloride: 98 mmol/L (ref 98–111)
Creatinine, Ser: 2.04 mg/dL — ABNORMAL HIGH (ref 0.61–1.24)
GFR, Estimated: 34 mL/min — ABNORMAL LOW (ref >60)
Glucose, Bld: 106 mg/dL — ABNORMAL HIGH (ref 70–99)
Potassium: 5.9 mmol/L — ABNORMAL HIGH (ref 3.5–5.1)
Sodium: 140 mmol/L (ref 135–145)
Total Bilirubin: 0.9 mg/dL (ref 0.3–1.2)
Total Protein: 6.6 g/dL (ref 6.5–8.1)

## 2021-04-20 LAB — BRAIN NATRIURETIC PEPTIDE: B Natriuretic Peptide: 1577.7 pg/mL — ABNORMAL HIGH (ref 0.0–100.0)

## 2021-04-20 LAB — TROPONIN I (HIGH SENSITIVITY): Troponin I (High Sensitivity): 25 ng/L — ABNORMAL HIGH (ref <18)

## 2021-04-20 MED ORDER — IOHEXOL 350 MG/ML SOLN: 100.0000 mL | Freq: Once | INTRAVENOUS | Status: DC | PRN

## 2021-04-20 MED ORDER — FUROSEMIDE 10 MG/ML IJ SOLN
80.0000 mg | Freq: Once | INTRAMUSCULAR | Status: AC
  Administered 2021-04-21: 80 mg via INTRAVENOUS
  Filled 2021-04-20: qty 8

## 2021-04-20 NOTE — ED Notes (Signed)
Care transferred, report received from [place], [place]

## 2021-04-20 NOTE — ED Provider Notes (Signed)
Emergency Medicine Provider Triage Evaluation Note  Christopher [name] , a 70 y.o. male  was evaluated in triage.  Pt complains of SOB and confusion.  Review of Systems  Positive: SOB Negative: Chest pain  Physical Exam  BP 120/69 (BP Location: Right Arm)   Pulse (!) 56   Temp 98.8 F (37.1 C) (Axillary)   Resp (!) 22   SpO2 96%  Gen:   Awake, mild dyspnea Resp:  Tachypnea to mid 20s MSK:   Moves extremities without difficulty , lower extremity edema noted Other:    Medical Decision Making  Medically screening exam initiated at 10:27 PM.  Appropriate orders placed.  Evangeline Dakin was informed that the remainder of the evaluation will be completed by another provider, this initial triage assessment does not replace that evaluation, and the importance of remaining in the ED until their evaluation is complete.     Vladimir Crofts, MD 04/20/21 2228

## 2021-04-20 NOTE — ED Provider Notes (Signed)
[facility] Emergency Department Provider Note ____________________________________________   Event Date/Time   First MD Initiated Contact with Patient 04/20/21 2300     (approximate)  I have reviewed the triage vital signs and the nursing notes.   HISTORY  Chief Complaint Shortness of Breath and Hypotension  Level 5 caveat: History of present illness limited due to altered mental status  HPI Christopher [name] is a 70 y.o. male with PMH as noted below including obesity hypoventilation syndrome, CKD, CHF, sleep apnea on CPAP, hypoxic respiratory failure on 2 L nasal cannula presents with hypoxia and altered mental status.  The patient's daughter states that he was at his rehab facility this afternoon and appeared confused.  His nasal cannula was partially off of his face and his O2 saturation was noted to be in the low 80s.  The daughter also states that the patient has had intermittent altered mental status, mainly somnolence and confusion over the last few weeks.   Past Medical History:  Diagnosis Date   Anxiety    CHF (congestive heart failure) ([place])    Depression    Dysrhythmia    Hypothyroidism    Sleep apnea     Patient Active Problem List   Diagnosis Date Noted   Acute respiratory failure ([place]) 04/21/2021   Acute on chronic respiratory failure with hypoxia and hypercapnia ([place]) [id number]   Acute metabolic encephalopathy Q000111Q   CKD (chronic kidney disease), stage IIIa 03/22/2021   Leukocytosis 03/22/2021   Elevated troponin 03/22/2021   Open wound of left foot 03/22/2021   Osteomyelitis of ankle or foot, acute, left ([place]) 03/17/2021   Acute on chronic respiratory failure ([place]) 03/17/2021   Osteomyelitis ([place]) 12/16/2020   Closed left ankle fracture 12/16/2020   Depression 12/16/2020   Medicare annual wellness visit, initial 11/14/2019   B12 deficiency 11/14/2019   Hyperlipidemia, mixed 10/31/2019   Primary osteoarthritis of right knee  03/16/2019   Obstructive sleep apnea (adult) (pediatric) 10/31/2018   Traumatic incomplete tear of left rotator cuff 10/02/2018   Morbid obesity with BMI of 45.0-49.9, adult ([place]) 09/20/2018   Acquired hypothyroidism 09/20/2018   Chronic diastolic CHF (congestive heart failure), NYHA class 3 ([place]) 09/20/2018   Benign essential hypertension 09/20/2018    Past Surgical History:  Procedure Laterality Date   FRACTURE SURGERY     ORIF ANKLE FRACTURE Left 12/17/2020   Procedure: OPEN REDUCTION INTERNAL FIXATION (ORIF) ANKLE FRACTURE;  Surgeon: Samara Deist, DPM;  Location: [facility] ORS;  Service: Podiatry;  Laterality: Left;    Prior to Admission medications   Medication Sig Start Date End Date Taking? Authorizing Provider  aspirin 81 MG EC tablet Take 81 mg by mouth at bedtime.    [provider]  atorvastatin (LIPITOR) 20 MG tablet Take 20 mg by mouth at bedtime. 10/06/20   [provider]  buPROPion (WELLBUTRIN SR) 150 MG 12 hr tablet Take 150 mg by mouth 2 (two) times daily. After 1st meal of day and after dinner 12/02/20   [provider]  Cholecalciferol 25 MCG (1000 UT) capsule Take 1,000 Units by mouth at bedtime.    [provider]  cyanocobalamin 1000 MCG tablet Take 1,000 mcg by mouth 2 (two) times a week. At bedtime. Sundays and Wednesdays    [provider]  Ensure Max Protein (ENSURE MAX PROTEIN) LIQD Take 330 mLs (11 oz total) by mouth 2 (two) times daily. 03/19/21   Lorella Nimrod, MD  furosemide (LASIX) 40 MG tablet Take 1  tablet (40 mg total) by mouth every other day. After 1st meal of the day 03/19/21   Lorella Nimrod, MD  gabapentin (NEURONTIN) 300 MG capsule Take 300 mg by mouth 3 (three) times daily. After 1st meal of day, after dinner, and at bedtime 04/07/20   [provider]  ibuprofen (ADVIL) 200 MG tablet Take 400-600 mg by mouth every 6 (six) hours as needed for mild pain or moderate pain.    [provider]   levalbuterol Penne Lash HFA) 45 MCG/ACT inhaler Inhale 2 puffs into the lungs every 4 (four) hours as needed for wheezing.    [provider]  levalbuterol Penne Lash) 0.63 MG/3ML nebulizer solution Take 3 mLs (0.63 mg total) by nebulization every 6 (six) hours as needed for wheezing or shortness of breath. 03/24/21   Fritzi Mandes, MD  levothyroxine (SYNTHROID) 88 MCG tablet Take 88 mcg by mouth daily. 30 to 60 minutes before breakfast on an empty stomach and with a glass of water 12/02/20   [provider]  Multiple Vitamins-Minerals (PRESERVISION AREDS 2) CAPS Take 1 capsule by mouth 2 (two) times daily. With food. After 1st meal of day and after dinner    [provider]  mupirocin ointment (BACTROBAN) 2 % Apply topically daily. 03/19/21   Lorella Nimrod, MD  OXYGEN Inhale 2 L into the lungs continuous.    [provider]  potassium chloride (KLOR-CON) 10 MEQ tablet Take 10 mEq by mouth 2 (two) times daily. After 1st meal of day and after dinner 10/21/20   [provider]  propranolol (INDERAL) 40 MG tablet Take 40 mg by mouth daily. After dinner 01/01/21   [provider]  trolamine salicylate (ASPERCREME) 10 % cream Apply 1 application topically as needed for muscle pain. Apply to knees and feet    [provider]  Vitamins A & D (VITAMIN A & D) ointment Apply 1 application topically as needed. To groin area.    [provider]    Allergies Albuterol, Gramineae pollens, and Other  Family History  Problem Relation Age of Onset   Hypertension Mother     Social History Social History   Tobacco Use   Smoking status: Never   Smokeless tobacco: Never  Vaping Use   Vaping Use: Never used  Substance Use Topics   Alcohol use: Not Currently   Drug use: Never    Review of Systems Level 5 caveat: Unable to obtain review of systems due to altered mental status    ____________________________________________   PHYSICAL  EXAM:  VITAL SIGNS: ED Triage Vitals  Enc Vitals Group     BP 04/20/21 2218 120/69     Pulse Rate 04/20/21 2218 (!) 56     Resp 04/20/21 2218 (!) 22     Temp 04/20/21 2218 98.8 F (37.1 C)     Temp Source 04/20/21 2218 Axillary     SpO2 04/20/21 2217 96 %     Weight --      Height --      Head Circumference --      Peak Flow --      Pain Score 04/20/21 2218 0     Pain Loc --      Pain Edu? --      Excl. in [place]? --     Constitutional: Somnolent but arousable.  Chronically ill-appearing but in no acute distress. Eyes: Conjunctivae are normal.  Head: Atraumatic. Nose: No congestion/rhinnorhea. Mouth/Throat: Mucous membranes are moist.   Neck:  Normal range of motion.  Cardiovascular: Normal rate, regular rhythm. Grossly normal heart sounds.  Good peripheral circulation. Respiratory: Somewhat decreased respiratory effort.  No retractions.  Diminished breath sounds bilaterally. Gastrointestinal: Soft and nontender. No distention.  Genitourinary: No flank tenderness. Musculoskeletal: 2+ bilateral lower extremity edema and chronic venous stasis changes.  Extremities warm and well perfused.  Neurologic: Motor intact in all extremities Skin:  Skin is warm and dry. No rash noted. Psychiatric: Unable to assess due to altered mental status.  ____________________________________________   LABS (all labs ordered are listed, but only abnormal results are displayed)  Labs Reviewed  BLOOD GAS, ARTERIAL - Abnormal; Notable for the following components:      Result Value   pH, Arterial 7.33 (*)    pCO2 arterial 80 (*)    pO2, Arterial 74 (*)    Bicarbonate 42.2 (*)    Acid-Base Excess 12.3 (*)    All other components within normal limits  COMPREHENSIVE METABOLIC PANEL - Abnormal; Notable for the following components:   Potassium 5.9 (*)    CO2 38 (*)    Glucose, Bld 106 (*)    BUN 34 (*)    Creatinine, Ser 2.04 (*)    Calcium 8.8 (*)    Albumin 3.0 (*)    GFR, Estimated 34 (*)     Anion gap 4 (*)    All other components within normal limits  CBC WITH DIFFERENTIAL/PLATELET - Abnormal; Notable for the following components:   MCHC 29.0 (*)    RDW 16.5 (*)    Monocytes Absolute 1.1 (*)    All other components within normal limits  BRAIN NATRIURETIC PEPTIDE - Abnormal; Notable for the following components:   B Natriuretic Peptide 1,577.7 (*)    All other components within normal limits  BLOOD GAS, ARTERIAL - Abnormal; Notable for the following components:   pH, Arterial 7.33 (*)    pCO2 arterial 80 (*)    pO2, Arterial 77 (*)    Bicarbonate 42.2 (*)    Acid-Base Excess 12.3 (*)    All other components within normal limits  TROPONIN I (HIGH SENSITIVITY) - Abnormal; Notable for the following components:   Troponin I (High Sensitivity) 25 (*)    All other components within normal limits  TROPONIN I (HIGH SENSITIVITY) - Abnormal; Notable for the following components:   Troponin I (High Sensitivity) 23 (*)    All other components within normal limits  RESP PANEL BY RT-PCR (FLU A&B, COVID) [id number]   ____________________________________________  EKG  ED ECG REPORT I, Arta Silence, the attending physician, personally viewed and interpreted this ECG.  Date: 04/20/2021 EKG Time: 2257 Rate: 58 Rhythm: normal sinus rhythm QRS Axis: normal Intervals: normal ST/T Wave abnormalities: Nonspecific T wave abnormalities in the anterior leads with deep T wave inversions new from prior EKGs Narrative Interpretation: Nonspecific T wave abnormalities; no evidence of acute ischemia  ____________________________________________  RADIOLOGY  Chest x-ray interpreted by me shows cardiomegaly and pulmonary vascular congestion with no focal consolidation  ____________________________________________   PROCEDURES  Procedure(s) performed: No  Procedures  Critical Care performed: Yes  CRITICAL CARE Performed by: Arta Silence   Total critical care time: 30  minutes  Critical care time was exclusive of separately billable procedures and treating other patients.  Critical care was necessary to treat or prevent imminent or life-threatening deterioration.  Critical care was time spent personally by me on the following activities: development of treatment plan with patient and/or surrogate as well as nursing,  discussions with consultants, evaluation of patient's response to treatment, examination of patient, obtaining history from patient or surrogate, ordering and performing treatments and interventions, ordering and review of laboratory studies, ordering and review of radiographic studies, pulse oximetry and re-evaluation of patient's condition.  ____________________________________________   INITIAL IMPRESSION / ASSESSMENT AND PLAN / ED COURSE  Pertinent labs & imaging results that were available during my care of the patient were reviewed by me and considered in my medical decision making (see chart for details).   70 year old male with PMH as noted above including obesity hypoventilation syndrome, CKD, CHF, sleep apnea on CPAP, hypoxic respiratory failure on 2 L nasal cannula presents due to hypoxia as well as intermittent altered mental status over the last few weeks.  On ED arrival, the patient was noted to have mild dyspnea and was awake but somnolent appearing.  On my exam, the patient is now on BiPAP.  He is somnolent but arousable and able to follow commands.  Neuro exam is nonfocal.  Lung sounds are diminished bilaterally.  He has chronic appearing bilateral lower extremity edema and venous stasis changes.  I reviewed the past medical records in [place].  The patient was last seen in the ED on 8/8 for an episode of hypoxia which resolved.  He was last admitted earlier this month with acute on chronic respiratory failure requiring BiPAP.  Initial lab work-up reveals significant hypercapnia, however this is similar to the finding on prior VBG's  when he has presented for shortness of breath.  EKG does show new anterior T wave inversions.  Overall I suspect most likely hypercapnic respiratory failure related to CHF, OSA, or obesity hypoventilation.  Differential also includes COVID-19, pneumonia, acute bronchitis.  I have a low suspicion for PE given that the patient's symptoms have been intermittent and overall this appears to be an exacerbation of a chronic process.  We will continue BiPAP, obtain a COVID swab, had an chest, and plan for admission.  ----------------------------------------- 1:36 AM on 04/21/2021 -----------------------------------------  Repeat troponin has not increased further.  COVID is negative.  CT head is unremarkable.  I discussed CT of the chest with radiology.  The patient has an elevated creatinine and borderline GFR, and due to his weight he will not be able to get a decreased dose of contrast, so there is renal risk of getting a CT with IV contrast at this time.  Given that my suspicion for PE is overall very low, we will forego CT chest at this time.  Repeat VBG is unchanged.  On repeat exam the patient continues to be somnolent but arousable.  I do not feel that the patient will need ICU level care at this time given his subacute to chronic presentation and stable vital signs.  I consulted Dr. Sidney Ace from the hospitalist service for admission.  ____________________________________________   FINAL CLINICAL IMPRESSION(S) / ED DIAGNOSES  Final diagnoses:  Acute on chronic respiratory failure with hypoxia and hypercapnia (HCC)      NEW MEDICATIONS STARTED DURING THIS VISIT:  New Prescriptions   No medications on file     Note:  This document was prepared using Dragon voice recognition software and may include unintentional dictation errors.    Arta Silence, MD 04/21/21 [phone]

## 2021-04-20 NOTE — ED Triage Notes (Signed)
Patient to ED via EMS from [facility] for c/o SOB and hypotension. Patient seems drowsy and is poor historian. Per EMS, patient is on 2-3LNC at baseline.

## 2021-04-20 NOTE — ED Notes (Signed)
Attending messaged for foley catheter order

## 2021-04-20 NOTE — ED Notes (Signed)
MD at the bedside  

## 2021-04-21 ENCOUNTER — Inpatient Hospital Stay
Admit: 2021-04-21 | Discharge: 2021-04-21 | Disposition: A | Discharge status: Home / Self Care | Payer: Medicare Other | Attending: Family Medicine | Admitting: Family Medicine

## 2021-04-21 DIAGNOSIS — I10 Essential (primary) hypertension: Secondary | ICD-10-CM | POA: Diagnosis not present

## 2021-04-21 DIAGNOSIS — T502X5A Adverse effect of carbonic-anhydrase inhibitors, benzothiadiazides and other diuretics, initial encounter: Secondary | ICD-10-CM | POA: Diagnosis present

## 2021-04-21 DIAGNOSIS — I5033 Acute on chronic diastolic (congestive) heart failure: Secondary | ICD-10-CM

## 2021-04-21 DIAGNOSIS — J449 Chronic obstructive pulmonary disease, unspecified: Secondary | ICD-10-CM | POA: Diagnosis present

## 2021-04-21 DIAGNOSIS — E039 Hypothyroidism, unspecified: Secondary | ICD-10-CM | POA: Diagnosis present

## 2021-04-21 DIAGNOSIS — N178 Other acute kidney failure: Secondary | ICD-10-CM

## 2021-04-21 DIAGNOSIS — J9621 Acute and chronic respiratory failure with hypoxia: Secondary | ICD-10-CM | POA: Diagnosis present

## 2021-04-21 DIAGNOSIS — E785 Hyperlipidemia, unspecified: Secondary | ICD-10-CM | POA: Diagnosis present

## 2021-04-21 DIAGNOSIS — G4733 Obstructive sleep apnea (adult) (pediatric): Secondary | ICD-10-CM | POA: Diagnosis not present

## 2021-04-21 DIAGNOSIS — N179 Acute kidney failure, unspecified: Secondary | ICD-10-CM

## 2021-04-21 DIAGNOSIS — N1831 Chronic kidney disease, stage 3a: Secondary | ICD-10-CM

## 2021-04-21 DIAGNOSIS — Z20822 Contact with and (suspected) exposure to covid-19: Secondary | ICD-10-CM | POA: Diagnosis present

## 2021-04-21 DIAGNOSIS — J9600 Acute respiratory failure, unspecified whether with hypoxia or hypercapnia: Secondary | ICD-10-CM | POA: Diagnosis present

## 2021-04-21 DIAGNOSIS — E662 Morbid (severe) obesity with alveolar hypoventilation: Secondary | ICD-10-CM | POA: Diagnosis present

## 2021-04-21 DIAGNOSIS — F419 Anxiety disorder, unspecified: Secondary | ICD-10-CM | POA: Diagnosis present

## 2021-04-21 DIAGNOSIS — L89322 Pressure ulcer of left buttock, stage 2: Secondary | ICD-10-CM | POA: Diagnosis present

## 2021-04-21 DIAGNOSIS — F32A Depression, unspecified: Secondary | ICD-10-CM | POA: Diagnosis present

## 2021-04-21 DIAGNOSIS — E875 Hyperkalemia: Secondary | ICD-10-CM | POA: Diagnosis present

## 2021-04-21 DIAGNOSIS — I959 Hypotension, unspecified: Secondary | ICD-10-CM | POA: Diagnosis present

## 2021-04-21 DIAGNOSIS — E538 Deficiency of other specified B group vitamins: Secondary | ICD-10-CM | POA: Diagnosis present

## 2021-04-21 DIAGNOSIS — E872 Acidosis: Secondary | ICD-10-CM | POA: Diagnosis present

## 2021-04-21 DIAGNOSIS — Z9119 Patient's noncompliance with other medical treatment and regimen: Secondary | ICD-10-CM | POA: Diagnosis not present

## 2021-04-21 DIAGNOSIS — I130 Hypertensive heart and chronic kidney disease with heart failure and stage 1 through stage 4 chronic kidney disease, or unspecified chronic kidney disease: Secondary | ICD-10-CM | POA: Diagnosis present

## 2021-04-21 DIAGNOSIS — Z9981 Dependence on supplemental oxygen: Secondary | ICD-10-CM | POA: Diagnosis not present

## 2021-04-21 DIAGNOSIS — R627 Adult failure to thrive: Secondary | ICD-10-CM | POA: Diagnosis present

## 2021-04-21 DIAGNOSIS — J9622 Acute and chronic respiratory failure with hypercapnia: Secondary | ICD-10-CM

## 2021-04-21 DIAGNOSIS — L89222 Pressure ulcer of left hip, stage 2: Secondary | ICD-10-CM | POA: Diagnosis present

## 2021-04-21 DIAGNOSIS — Z6841 Body Mass Index (BMI) 40.0 and over, adult: Secondary | ICD-10-CM | POA: Diagnosis not present

## 2021-04-21 DIAGNOSIS — N189 Chronic kidney disease, unspecified: Secondary | ICD-10-CM

## 2021-04-21 LAB — BASIC METABOLIC PANEL
Anion gap: 8 (ref 5–15)
BUN: 36 mg/dL — ABNORMAL HIGH (ref 8–23)
CO2: 36 mmol/L — ABNORMAL HIGH (ref 22–32)
Calcium: 8.7 mg/dL — ABNORMAL LOW (ref 8.9–10.3)
Chloride: 97 mmol/L — ABNORMAL LOW (ref 98–111)
Creatinine, Ser: 2.13 mg/dL — ABNORMAL HIGH (ref 0.61–1.24)
GFR, Estimated: 33 mL/min — ABNORMAL LOW (ref >60)
Glucose, Bld: 91 mg/dL (ref 70–99)
Potassium: 5.2 mmol/L — ABNORMAL HIGH (ref 3.5–5.1)
Sodium: 141 mmol/L (ref 135–145)

## 2021-04-21 LAB — BLOOD GAS, ARTERIAL
Acid-Base Excess: 12.3 mmol/L — ABNORMAL HIGH (ref 0.0–2.0)
Acid-Base Excess: 12.2 mmol/L — ABNORMAL HIGH (ref 0.0–2.0)
Bicarbonate: 42.2 mmol/L — ABNORMAL HIGH (ref 20.0–28.0)
Bicarbonate: 42.2 mmol/L — ABNORMAL HIGH (ref 20.0–28.0)
Delivery systems: POSITIVE
Delivery systems: POSITIVE
Expiratory PAP: 6
Expiratory PAP: 6
FIO2: 0.36
FIO2: 40
Inspiratory PAP: 16
Inspiratory PAP: 18
O2 Saturation: 94.3 %
O2 Saturation: 95.8 %
Patient temperature: 37
Patient temperature: 37
pCO2 arterial: 80 mmHg (ref 32.0–48.0)
pCO2 arterial: 82 mmHg (ref 32.0–48.0)
pH, Arterial: 7.33 — ABNORMAL LOW (ref 7.350–7.450)
pH, Arterial: 7.32 — ABNORMAL LOW (ref 7.350–7.450)
pO2, Arterial: 77 mmHg — ABNORMAL LOW (ref 83.0–108.0)
pO2, Arterial: 87 mmHg (ref 83.0–108.0)

## 2021-04-21 LAB — CBC
HCT: 47.2 % (ref 39.0–52.0)
Hemoglobin: 13.9 g/dL (ref 13.0–17.0)
MCH: 28.5 pg (ref 26.0–34.0)
MCHC: 29.4 g/dL — ABNORMAL LOW (ref 30.0–36.0)
MCV: 96.7 fL (ref 80.0–100.0)
Platelets: 178 10*3/uL (ref 150–400)
RBC: 4.88 MIL/uL (ref 4.22–5.81)
RDW: 16.5 % — ABNORMAL HIGH (ref 11.5–15.5)
WBC: 5.1 10*3/uL (ref 4.0–10.5)
nRBC: 0 % (ref 0.0–0.2)

## 2021-04-21 LAB — TROPONIN I (HIGH SENSITIVITY): Troponin I (High Sensitivity): 23 ng/L — ABNORMAL HIGH (ref <18)

## 2021-04-21 LAB — ECHOCARDIOGRAM COMPLETE: S' Lateral: 3.44 cm

## 2021-04-21 LAB — GLUCOSE, CAPILLARY: Glucose-Capillary: 128 mg/dL — ABNORMAL HIGH (ref 70–99)

## 2021-04-21 LAB — MRSA NEXT GEN BY PCR, NASAL: MRSA by PCR Next Gen: NOT DETECTED

## 2021-04-21 MED ORDER — ALBUMIN HUMAN 25 % IV SOLN
25.0000 g | Freq: Once | INTRAVENOUS | Status: AC
  Administered 2021-04-21: 25 g via INTRAVENOUS
  Filled 2021-04-21: qty 100

## 2021-04-21 MED ORDER — ONDANSETRON HCL 4 MG PO TABS: 4.0000 mg | ORAL_TABLET | Freq: Four times a day (QID) | ORAL | Status: DC | PRN

## 2021-04-21 MED ORDER — INSULIN ASPART 100 UNIT/ML IV SOLN
10.0000 [IU] | Freq: Once | INTRAVENOUS | Status: AC
  Administered 2021-04-21: 10 [IU] via INTRAVENOUS
  Filled 2021-04-21: qty 0.1

## 2021-04-21 MED ORDER — ENOXAPARIN SODIUM 100 MG/ML IJ SOSY
0.5000 mg/kg | PREFILLED_SYRINGE | INTRAMUSCULAR | Status: DC
  Administered 2021-04-21 – 2021-04-29 (×9): 85 mg via SUBCUTANEOUS
  Filled 2021-04-21 (×9): qty 0.85

## 2021-04-21 MED ORDER — BUPROPION HCL ER (SR) 150 MG PO TB12
150.0000 mg | ORAL_TABLET | Freq: Two times a day (BID) | ORAL | Status: DC
  Administered 2021-04-21 – 2021-04-29 (×17): 150 mg via ORAL
  Filled 2021-04-21 (×18): qty 1

## 2021-04-21 MED ORDER — VITAMIN B-12 1000 MCG PO TABS
1000.0000 ug | ORAL_TABLET | ORAL | Status: DC
  Administered 2021-04-22 – 2021-04-26 (×2): 1000 ug via ORAL
  Filled 2021-04-21 (×2): qty 1

## 2021-04-21 MED ORDER — GABAPENTIN 100 MG PO CAPS
100.0000 mg | ORAL_CAPSULE | Freq: Three times a day (TID) | ORAL | Status: DC
  Administered 2021-04-21 – 2021-04-29 (×23): 100 mg via ORAL
  Filled 2021-04-21 (×23): qty 1

## 2021-04-21 MED ORDER — MUPIROCIN 2 % EX OINT
TOPICAL_OINTMENT | Freq: Every day | CUTANEOUS | Status: DC
  Administered 2021-04-21: 1 via TOPICAL
  Filled 2021-04-21: qty 22

## 2021-04-21 MED ORDER — GABAPENTIN 300 MG PO CAPS
300.0000 mg | ORAL_CAPSULE | Freq: Three times a day (TID) | ORAL | Status: DC
  Administered 2021-04-21: 300 mg via ORAL
  Filled 2021-04-21: qty 1

## 2021-04-21 MED ORDER — LEVOTHYROXINE SODIUM 88 MCG PO TABS
88.0000 ug | ORAL_TABLET | Freq: Every day | ORAL | Status: DC
  Administered 2021-04-21 – 2021-04-29 (×9): 88 ug via ORAL
  Filled 2021-04-21 (×9): qty 1

## 2021-04-21 MED ORDER — ALBUMIN HUMAN 25 % IV SOLN: 12.5000 g | Freq: Once | INTRAVENOUS | Status: DC

## 2021-04-21 MED ORDER — ALBUMIN HUMAN 25 % IV SOLN
50.0000 g | Freq: Once | INTRAVENOUS | Status: AC
  Administered 2021-04-21: 50 g via INTRAVENOUS
  Filled 2021-04-21: qty 200

## 2021-04-21 MED ORDER — ACETAMINOPHEN 650 MG RE SUPP: 650.0000 mg | Freq: Four times a day (QID) | RECTAL | Status: DC | PRN

## 2021-04-21 MED ORDER — VITAMINS A & D EX OINT
1.0000 "application " | TOPICAL_OINTMENT | CUTANEOUS | Status: DC | PRN
  Filled 2021-04-21: qty 113

## 2021-04-21 MED ORDER — LEVALBUTEROL HCL 0.63 MG/3ML IN NEBU: 0.6300 mg | INHALATION_SOLUTION | Freq: Four times a day (QID) | RESPIRATORY_TRACT | Status: DC | PRN

## 2021-04-21 MED ORDER — PATIROMER SORBITEX CALCIUM 8.4 G PO PACK
8.4000 g | PACK | Freq: Every day | ORAL | Status: DC
  Administered 2021-04-21 – 2021-04-28 (×7): 8.4 g via ORAL
  Filled 2021-04-21 (×9): qty 1

## 2021-04-21 MED ORDER — POTASSIUM CHLORIDE ER 10 MEQ PO TBCR: 10.0000 meq | EXTENDED_RELEASE_TABLET | Freq: Two times a day (BID) | ORAL | Status: DC

## 2021-04-21 MED ORDER — FUROSEMIDE 10 MG/ML IJ SOLN
4.0000 mg/h | INTRAVENOUS | Status: DC
  Administered 2021-04-21 (×2): 4 mg/h via INTRAVENOUS
  Filled 2021-04-21 (×2): qty 20

## 2021-04-21 MED ORDER — ENSURE MAX PROTEIN PO LIQD
11.0000 [oz_av] | Freq: Two times a day (BID) | ORAL | Status: DC
  Administered 2021-04-21 – 2021-04-28 (×7): 11 [oz_av] via ORAL
  Filled 2021-04-21: qty 330

## 2021-04-21 MED ORDER — METHYLPREDNISOLONE SODIUM SUCC 40 MG IJ SOLR
40.0000 mg | INTRAMUSCULAR | Status: DC
  Administered 2021-04-21 – 2021-04-26 (×6): 40 mg via INTRAVENOUS
  Filled 2021-04-21 (×6): qty 1

## 2021-04-21 MED ORDER — TRAZODONE HCL 50 MG PO TABS: 25.0000 mg | ORAL_TABLET | Freq: Every evening | ORAL | Status: DC | PRN

## 2021-04-21 MED ORDER — ONDANSETRON HCL 4 MG/2ML IJ SOLN: 4.0000 mg | Freq: Four times a day (QID) | INTRAMUSCULAR | Status: DC | PRN

## 2021-04-21 MED ORDER — MIDODRINE HCL 5 MG PO TABS
5.0000 mg | ORAL_TABLET | Freq: Once | ORAL | Status: AC
  Administered 2021-04-21: 5 mg via ORAL
  Filled 2021-04-21: qty 1

## 2021-04-21 MED ORDER — LEVALBUTEROL HCL 0.63 MG/3ML IN NEBU
0.6300 mg | INHALATION_SOLUTION | RESPIRATORY_TRACT | Status: DC | PRN
  Filled 2021-04-21: qty 3

## 2021-04-21 MED ORDER — ACETAMINOPHEN 325 MG PO TABS: 650.0000 mg | ORAL_TABLET | Freq: Four times a day (QID) | ORAL | Status: DC | PRN

## 2021-04-21 MED ORDER — OCUVITE-LUTEIN PO CAPS
1.0000 | ORAL_CAPSULE | Freq: Two times a day (BID) | ORAL | Status: DC
  Administered 2021-04-21 – 2021-04-29 (×16): 1 via ORAL
  Filled 2021-04-21 (×22): qty 1

## 2021-04-21 MED ORDER — CHLORHEXIDINE GLUCONATE CLOTH 2 % EX PADS
6.0000 | MEDICATED_PAD | Freq: Every day | CUTANEOUS | Status: DC
  Administered 2021-04-21 – 2021-04-26 (×6): 6 via TOPICAL

## 2021-04-21 MED ORDER — ATORVASTATIN CALCIUM 20 MG PO TABS
20.0000 mg | ORAL_TABLET | Freq: Every day | ORAL | Status: DC
  Administered 2021-04-21 – 2021-04-28 (×8): 20 mg via ORAL
  Filled 2021-04-21 (×8): qty 1

## 2021-04-21 MED ORDER — FUROSEMIDE 10 MG/ML IJ SOLN: 40.0000 mg | Freq: Two times a day (BID) | INTRAMUSCULAR | Status: DC

## 2021-04-21 MED ORDER — DEXTROSE 50 % IV SOLN
1.0000 | Freq: Once | INTRAVENOUS | Status: AC
  Administered 2021-04-21: 50 mL via INTRAVENOUS
  Filled 2021-04-21: qty 50

## 2021-04-21 MED ORDER — VITAMIN D 25 MCG (1000 UNIT) PO TABS
1000.0000 [IU] | ORAL_TABLET | Freq: Every day | ORAL | Status: DC
  Administered 2021-04-21 – 2021-04-28 (×8): 1000 [IU] via ORAL
  Filled 2021-04-21 (×8): qty 1

## 2021-04-21 MED ORDER — ASPIRIN EC 81 MG PO TBEC
81.0000 mg | DELAYED_RELEASE_TABLET | Freq: Every day | ORAL | Status: DC
  Administered 2021-04-21 – 2021-04-28 (×8): 81 mg via ORAL
  Filled 2021-04-21 (×8): qty 1

## 2021-04-21 MED ORDER — PROPRANOLOL HCL 20 MG PO TABS
40.0000 mg | ORAL_TABLET | Freq: Every day | ORAL | Status: DC
  Administered 2021-04-21: 40 mg via ORAL
  Filled 2021-04-21: qty 2

## 2021-04-21 MED ORDER — MAGNESIUM HYDROXIDE 400 MG/5ML PO SUSP: 30.0000 mL | Freq: Every day | ORAL | Status: DC | PRN

## 2021-04-21 NOTE — ED Notes (Signed)
RT called to evaluate need to increase rate on Bipap per MD request

## 2021-04-21 NOTE — ED Notes (Signed)
Dr. Zhang at bedside.  

## 2021-04-21 NOTE — ED Notes (Signed)
RT at the bedside to draw repeat ABG

## 2021-04-21 NOTE — ED Notes (Signed)
Dr. Parachos at bedside.  

## 2021-04-21 NOTE — Consult Note (Signed)
[facility] Cardiology  CARDIOLOGY CONSULT NOTE  Patient ID: Christopher [name] MRN: [id number] DOB/AGE: 1950-10-29 70 y.o.  Admit date: 04/20/2021 Referring Physician [facility] Primary Physician [facility] Primary Cardiologist  Reason for Consultation congestive heart failure  HPI: 70 year old gentleman referred for evaluation of congestive heart failure.  Patient has a history of diastolic congestive heart failure, respiratory failure on 2 L of O2 by nasal cannula, obstructive sleep apnea, to [facility] ED a skilled nursing facility for altered mental status.  Patient reportedly has been confused and somnolent for several weeks.  In the emergency room, the patient was noted to be hypoxic.  Chest x-ray revealed pulmonary congestion.  ECG revealed sinus rhythm with T wave inversions in leads V3 and V4.  The patient denies chest pain.  High-sensitivity troponin was borderline elevated without delta (25, 23).  Patient placed on BiPAP.  Admission labs notable for BUN and creatinine 34 and 2.04, respectively.  Patient has been treated with furosemide 80 mg IV.  Review of systems complete and found to be negative unless listed above     Past Medical History:  Diagnosis Date   Anxiety    CHF (congestive heart failure) ([place])    Depression    Dysrhythmia    Hypothyroidism    Sleep apnea     Past Surgical History:  Procedure Laterality Date   FRACTURE SURGERY     ORIF ANKLE FRACTURE Left 12/17/2020   Procedure: OPEN REDUCTION INTERNAL FIXATION (ORIF) ANKLE FRACTURE;  Surgeon: Samara Deist, DPM;  Location: [facility] ORS;  Service: Podiatry;  Laterality: Left;    (Not in a hospital admission)  Social History   Socioeconomic History   Marital status: Married    Spouse name: Not on file   Number of children: Not on file   Years of education: Not on file   Highest education level: Not on file  Occupational History   Not on file  Tobacco Use   Smoking status: Never   Smokeless tobacco: Never  Vaping Use   Vaping  Use: Never used  Substance and Sexual Activity   Alcohol use: Not Currently   Drug use: Never   Sexual activity: Not Currently  Other Topics Concern   Not on file  Social History Narrative   Not on file   Social Determinants of Health   Financial Resource Strain: Not on file  Food Insecurity: Not on file  Transportation Needs: Not on file  Physical Activity: Not on file  Stress: Not on file  Social Connections: Not on file  Intimate Partner Violence: Not on file    Family History  Problem Relation Age of Onset   Hypertension Mother       Review of systems complete and found to be negative unless listed above      PHYSICAL EXAM  General: Well developed, well nourished, in no acute distress HEENT:  Normocephalic and atramatic Neck:  No JVD.  Lungs: Clear bilaterally to auscultation and percussion. Heart: HRRR . Normal S1 and S2 without gallops or murmurs.  Abdomen: Bowel sounds are positive, abdomen soft and non-tender  Msk:  Back normal, normal gait. Normal strength and tone for age. Extremities: No clubbing, cyanosis or edema.   Neuro: Alert and oriented X 3. Psych:  Good affect, responds appropriately  Labs:   Lab Results  Component Value Date   WBC 5.1 04/21/2021   HGB 13.9 04/21/2021   HCT 47.2 04/21/2021   MCV 96.7 04/21/2021   PLT 178 04/21/2021    Recent  Labs  Lab 04/20/21 2222 04/21/21 0539  NA 140 141  K 5.9* 5.2*  CL 98 97*  CO2 38* 36*  BUN 34* 36*  CREATININE 2.04* 2.13*  CALCIUM 8.8* 8.7*  PROT 6.6  --   BILITOT 0.9  --   ALKPHOS 46  --   ALT 10  --   AST 16  --   GLUCOSE 106* 91   No results found for: CKTOTAL, CKMB, CKMBINDEX, TROPONINI  Lab Results  Component Value Date   CHOL 88 03/23/2021   Lab Results  Component Value Date   HDL 26 (L) 03/23/2021   Lab Results  Component Value Date   LDLCALC 46 03/23/2021   Lab Results  Component Value Date   TRIG 78 03/23/2021   Lab Results  Component Value Date   CHOLHDL  3.4 03/23/2021   No results found for: LDLDIRECT    Radiology: CT HEAD WO CONTRAST (5MM)  Result Date: 04/21/2021 CLINICAL DATA:  Altered mental status. EXAM: CT HEAD WITHOUT CONTRAST TECHNIQUE: Contiguous axial images were obtained from the base of the skull through the vertex without intravenous contrast. COMPARISON:  March 23, 2021 FINDINGS: Brain: There is mild cerebral atrophy with widening of the extra-axial spaces and ventricular dilatation. There are areas of decreased attenuation within the white matter tracts of the supratentorial brain, consistent with microvascular disease changes. Vascular: No hyperdense vessel or unexpected calcification. Skull: Normal. Negative for fracture or focal lesion. Sinuses/Orbits: No acute finding. Other: None. IMPRESSION: 1. Generalized cerebral atrophy. 2. No acute intracranial abnormality. Electronically Signed   By: Virgina Norfolk M.D.   On: 04/21/2021 00:43   CT HEAD WO CONTRAST  Result Date: 03/23/2021 CLINICAL DATA:  70 year old male with history of mental status change. EXAM: CT HEAD WITHOUT CONTRAST TECHNIQUE: Contiguous axial images were obtained from the base of the skull through the vertex without intravenous contrast. COMPARISON:  No priors. FINDINGS: Brain: Mild cerebral atrophy. Patchy and confluent areas of decreased attenuation are noted throughout the deep and periventricular white matter of the cerebral hemispheres bilaterally, compatible with chronic microvascular ischemic disease. No evidence of acute infarction, hemorrhage, hydrocephalus, extra-axial collection or mass lesion/mass effect. Vascular: No hyperdense vessel or unexpected calcification. Skull: Normal. Negative for fracture or focal lesion. Sinuses/Orbits: No acute finding. Other: Small right mastoid effusion. IMPRESSION: 1. No acute intracranial abnormalities. 2. Small right mastoid effusion. 3. Mild cerebral atrophy with extensive chronic microvascular ischemic changes in the  cerebral white matter, as above. Electronically Signed   By: Vinnie Langton M.D.   On: 03/23/2021 08:05   DG Chest Portable 1 View  Result Date: 04/20/2021 CLINICAL DATA:  Shortness of breath and hypotension. EXAM: PORTABLE CHEST 1 VIEW COMPARISON:  March 30, 2021 FINDINGS: There is no evidence of acute infiltrate, pleural effusion or pneumothorax. Mild, stable prominence of the pulmonary vasculature is seen. The cardiac silhouette is mildly enlarged and unchanged in size. Degenerative changes seen throughout the thoracic spine. IMPRESSION: Stable cardiomegaly and pulmonary vascular congestion. Electronically Signed   By: Virgina Norfolk M.D.   On: 04/20/2021 22:42   DG Chest Portable 1 View  Result Date: 03/30/2021 CLINICAL DATA:  Shortness of breath upon exertion. EXAM: PORTABLE CHEST 1 VIEW COMPARISON:  One-view chest x-ray 03/22/2021 FINDINGS: Heart is enlarged. Mild pulmonary vascular congestion noted no focal airspace disease. No effusions. IMPRESSION: Cardiomegaly and mild pulmonary vascular congestion. Electronically Signed   By: San Morelle M.D.   On: 03/30/2021 18:38   DG Chest Portable 1  View  Result Date: 03/22/2021 CLINICAL DATA:  Shortness of breath EXAM: PORTABLE CHEST 1 VIEW COMPARISON:  03/17/2021 FINDINGS: Midline trachea. Normal heart size for level of inspiration. Mediastinal soft tissue fullness is likely technique related in this obese patient. No pleural effusion or pneumothorax. Pulmonary interstitial prominence is also favored to be technique related and partially secondary to low lung volumes. No congestive failure. There may be mild left base subsegmental atelectasis. IMPRESSION: Cardiomegaly and low lung volumes, without acute disease. Electronically Signed   By: Abigail Miyamoto M.D.   On: 03/22/2021 10:29   VAS [place] ABI WITH/WO TBI  Result Date: 04/10/2021  LOWER EXTREMITY DOPPLER STUDY Patient Name:  Christopher [name]  Date of Exam:   04/06/2021 Medical Rec #:  [id number]       Accession #:    [id number] Date of Birth: Jul 17, 1951       Patient Gender: M Patient Age:   22 years Exam Location:  [facility] Procedure:      VAS [place] ABI WITH/WO TBI Referring Phys: Marcelle Overlie --------------------------------------------------------------------------------  Indications: Ulceration.  Performing Technologist: Almira Coaster RVS  Examination Guidelines: A complete evaluation includes at minimum, Doppler waveform signals and systolic blood pressure reading at the level of bilateral brachial, anterior tibial, and posterior tibial arteries, when vessel segments are accessible. Bilateral testing is considered an integral part of a complete examination. Photoelectric Plethysmograph (PPG) waveforms and toe systolic pressure readings are included as required and additional duplex testing as needed. Limited examinations for reoccurring indications may be performed as noted.  ABI Findings: +---------+------------------+-----+---------+--------+ Right    Rt Pressure (mmHg)IndexWaveform Comment  +---------+------------------+-----+---------+--------+ Brachial 119                                      +---------+------------------+-----+---------+--------+ ATA      139                    triphasic1.16     +---------+------------------+-----+---------+--------+ PTA      111               0.92 triphasic         +---------+------------------+-----+---------+--------+ Great Toe110               0.92 Normal            +---------+------------------+-----+---------+--------+ +---------+------------------+-----+---------+-------+ Left     Lt Pressure (mmHg)IndexWaveform Comment +---------+------------------+-----+---------+-------+ Brachial 120                                     +---------+------------------+-----+---------+-------+ ATA      150                    triphasic1.25    +---------+------------------+-----+---------+-------+  PTA      157               1.31 triphasic        +---------+------------------+-----+---------+-------+ Great Toe116               0.97 Normal           +---------+------------------+-----+---------+-------+ +-------+-----------+-----------+------------+------------+ ABI/TBIToday's ABIToday's TBIPrevious ABIPrevious TBI +-------+-----------+-----------+------------+------------+ Right  1.16       .92                                 +-------+-----------+-----------+------------+------------+  Left   1.31       .97                                 +-------+-----------+-----------+------------+------------+  Summary: Right: Resting right ankle-brachial index is within normal range. No evidence of significant right lower extremity arterial disease. The right toe-brachial index is normal. Left: Resting left ankle-brachial index is within normal range. No evidence of significant left lower extremity arterial disease. The left toe-brachial index is normal.  *See table(s) above for measurements and observations.  Electronically signed by Leotis Pain MD on 04/10/2021 at 9:23:52 AM.    Final     EKG: Sinus rhythm with T wave inversions V3 and V4  ASSESSMENT AND PLAN:   1.  Acute on chronic diastolic congestive heart failure, and patient obesity, obstructive sleep apnea, and chronic respiratory failure on 2 L of oxygen, presents with altered mental status likely due to hypoxia. 2.  Chronic respiratory failure 3.  Chronic kidney disease, BUN and creatinine 34 2.04, respectively 4.  Obstructive sleep apnea, currently on BiPAP 5.  Abnormal ECG, in the absence of chest pain, with borderline elevated high-sensitivity troponin without delta  Recommendations  1.  Agree with current therapy 2.  Continue diuresis 3.  Carefully monitor renal status 4.  Consider nephrology consult 5.  Continue propranolol for now 6.  Defer full dose anticoagulation 7.  Review 2D echocardiogram 8.  Further  recommendations pending echocardiogram results   Signed: Isaias Cowman MD,PhD, [facility] 04/21/2021, 7:58 AM

## 2021-04-21 NOTE — ED Notes (Signed)
Continue to await albumin from pharmacy

## 2021-04-21 NOTE — Progress Notes (Signed)
PROGRESS NOTE    Christopher [name]  [id number] DOB: 1950-11-12 DOA: 04/20/2021 PCP: Rusty Aus, MD   Chief complaint.  Shortness of breath. Brief Narrative:  Christopher [name] is a 70 y.o. male with medical history significant for diastolic CHF, depression, anxiety, hypothyroidism and sleep apnea, chronic respiratory failure on 2 L of O2 by nasal cannula, who presented to the emergency room with acute onset of altered mental status with associated dyspnea and hypoxia at his rehab facility.  He has a worsening hypoxemia, was placed on BiPAP.  He has a profound elevation of BNP, started on IV Lasix for diastolic congestive heart failure.  Discussed with daughter, patient has had has been deteriorating since January after he had ankle injury.  He has not been able to walk.  He had increased confusion and hallucinations since April.  He has sleep apnea, not compliant with CPAP.  He currently lives in the nursing home.  Assessment & Plan:   Active Problems:   Acute respiratory failure (HCC)  Acute on chronic respite failure with hypoxemia and hypercapnia. Acute on chronic diastolic congestive heart failure. Morbid obesity  Obesity hypoventilation syndrome. Obstructive sleep apnea. Hypotension. Patient had significant volume overload evidenced by worsening short of breath and hypoxemia, elevated BNP, vascular congestion on chest x-ray. Echocardiogram is a pending. ABG has significant CO2 retention, will continue BiPAP.  RT will follow. Patient developed borderline hypotension after IV Lasix push.  I will change Lasix to IV drip. I will give 50 g albumin for blood pressure. Patient will be followed in the stepdown unit closely, if blood pressure continue to drop or respiratory status is worse, patient can be transferred to ICU. Patient be continued on BiPAP at nighttime if we can get off him today. I will also placed on 40 mg of IV Solu-Medrol in case he has a COPD.  It may also help if  patient has a relative adrenal insufficiency.  Acute kidney injury on chronic kidney disease stage IIIa. Hyperkalemia. Reviewed the patient labs, patient has a chronic kidney disease stage IIIa.  Worsening renal function due to diuretics. Albumin is given. Potassium is getting better after diuretics. Nephrology consult will be obtained.  Vitamin  B12 deficient anemia. Continue vitamin B12 supplements.  Failure to thrive. Delirium. Delirium appears to be secondary to CO2 retention and not compliant with CPAP.  Patient long-term prognosis is poor.   DVT prophylaxis: Lovenox Code Status: Full Family Communication: daughter updated Disposition Plan:    Status is: Inpatient  Remains inpatient appropriate because:IV treatments appropriate due to intensity of illness or inability to take PO and Inpatient level of care appropriate due to severity of illness  Dispo: The patient is from: SNF              Anticipated d/c is to: SNF              Patient currently is not medically stable to d/c.   Difficult to place patient No        I/O last 3 completed shifts: In: -  Out: 775 [Urine:775] No intake/output data recorded.     Consultants:  nephrology  Procedures: none  Antimicrobials: none   Subjective: Patient still very sleepy, he is confused.  He is on BiPAP, seems to be tolerating. He does not have any chest pain, no palpitation. He has short of breath with minimal exertion. No fever or chills. No dysuria hematuria  He does not have any abdominal pain or nausea vomiting.  Objective: Vitals:   04/21/21 1015 04/21/21 1100 04/21/21 1130 04/21/21 1215  BP: (!) 94/57 (!) 99/58 92/61 (!) 86/53  Pulse: (!) 50 (!) 50 (!) 51 (!) 51  Resp: '16 13 18 16  '$ Temp:      TempSrc:      SpO2: 100% 99% 98% 98%  Weight:        Intake/Output Summary (Last 24 hours) at 04/21/2021 1231 Last data filed at 04/21/2021 0700 Gross per 24 hour  Intake --  Output 775 ml  Net -775 ml    Filed Weights   04/21/21 0824  Weight: (!) 168 kg    Examination:  General exam: Morbid obese, on BiPAP.  No respite distress. Respiratory system: Crackles in the base. Respiratory effort normal. Cardiovascular system: S1 & S2 heard, RRR. No JVD, murmurs, rubs, gallops or clicks. No pedal edema. Gastrointestinal system: Abdomen is nondistended, soft and nontender. No organomegaly or masses felt. Normal bowel sounds heard. Central nervous system: Drowsy and oriented x2 no focal neurological deficits. Extremities: Symmetric 5 x 5 power. Skin: No rashes, lesions or ulcers     Data Reviewed: I have personally reviewed following labs and imaging studies  CBC: Recent Labs  Lab 04/20/21 2222 04/21/21 0539  WBC 5.5 5.1  NEUTROABS 2.2  --   HGB 14.1 13.9  HCT 48.7 47.2  MCV 96.2 96.7  PLT 223 0000000   Basic Metabolic Panel: Recent Labs  Lab 04/20/21 2222 04/21/21 0539  NA 140 141  K 5.9* 5.2*  CL 98 97*  CO2 38* 36*  GLUCOSE 106* 91  BUN 34* 36*  CREATININE 2.04* 2.13*  CALCIUM 8.8* 8.7*   GFR: Estimated Creatinine Clearance: 51.3 mL/min (A) (by C-G formula based on SCr of 2.13 mg/dL (H)). Liver Function Tests: Recent Labs  Lab 04/20/21 2222  AST 16  ALT 10  ALKPHOS 46  BILITOT 0.9  PROT 6.6  ALBUMIN 3.0*   No results for input(s): LIPASE, AMYLASE in the last 168 hours. No results for input(s): AMMONIA in the last 168 hours. Coagulation Profile: No results for input(s): INR, PROTIME in the last 168 hours. Cardiac Enzymes: No results for input(s): CKTOTAL, CKMB, CKMBINDEX, TROPONINI in the last 168 hours. BNP (last 3 results) No results for input(s): PROBNP in the last 8760 hours. HbA1C: No results for input(s): HGBA1C in the last 72 hours. CBG: No results for input(s): GLUCAP in the last 168 hours. Lipid Profile: No results for input(s): CHOL, HDL, LDLCALC, TRIG, CHOLHDL, LDLDIRECT in the last 72 hours. Thyroid Function Tests: No results for input(s):  TSH, T4TOTAL, FREET4, T3FREE, THYROIDAB in the last 72 hours. Anemia Panel: No results for input(s): VITAMINB12, FOLATE, FERRITIN, TIBC, IRON, RETICCTPCT in the last 72 hours. Sepsis Labs: No results for input(s): PROCALCITON, LATICACIDVEN in the last 168 hours.  Recent Results (from the past 240 hour(s))  Resp Panel by RT-PCR (Flu A&B, Covid) Nasopharyngeal Swab     Status: None   Collection Time: 04/20/21 10:47 PM   Specimen: Nasopharyngeal Swab; Nasopharyngeal(NP) swabs in vial transport medium  Result Value Ref Range Status   SARS Coronavirus 2 by RT PCR NEGATIVE NEGATIVE Final    Comment: (NOTE) SARS-CoV-2 target nucleic acids are NOT DETECTED.  The SARS-CoV-2 RNA is generally detectable in upper respiratory specimens during the acute phase of infection. The lowest concentration of SARS-CoV-2 viral copies this assay can detect is 138 copies/mL. A negative result does not preclude SARS-Cov-2 infection and should not be used as the sole  basis for treatment or other patient management decisions. A negative result may occur with  improper specimen collection/handling, submission of specimen other than nasopharyngeal swab, presence of viral mutation(s) within the areas targeted by this assay, and inadequate number of viral copies(<138 copies/mL). A negative result must be combined with clinical observations, patient history, and epidemiological information. The expected result is Negative.  Fact Sheet for Patients:  EntrepreneurPulse.com.au  Fact Sheet for Healthcare Providers:  IncredibleEmployment.be  This test is no t yet approved or cleared by the [place] FDA and  has been authorized for detection and/or diagnosis of SARS-CoV-2 by FDA under an Emergency Use Authorization (EUA). This EUA will remain  in effect (meaning this test can be used) for the duration of the COVID-19 declaration under Section 564(b)(1) of the Act,  21 U.S.C.section 360bbb-3(b)(1), unless the authorization is terminated  or revoked sooner.       Influenza A by PCR NEGATIVE NEGATIVE Final   Influenza B by PCR NEGATIVE NEGATIVE Final    Comment: (NOTE) The Xpert Xpress SARS-CoV-2/FLU/RSV plus assay is intended as an aid in the diagnosis of influenza from Nasopharyngeal swab specimens and should not be used as a sole basis for treatment. Nasal washings and aspirates are unacceptable for Xpert Xpress SARS-CoV-2/FLU/RSV testing.  Fact Sheet for Patients: EntrepreneurPulse.com.au  Fact Sheet for Healthcare Providers: IncredibleEmployment.be  This test is not yet approved or cleared by the [place] FDA and has been authorized for detection and/or diagnosis of SARS-CoV-2 by FDA under an Emergency Use Authorization (EUA). This EUA will remain in effect (meaning this test can be used) for the duration of the COVID-19 declaration under Section 564(b)(1) of the Act, 21 U.S.C. section 360bbb-3(b)(1), unless the authorization is terminated or revoked.  Performed at [facility], [address] [place], [place] [address]          Radiology Studies: CT HEAD WO CONTRAST (5MM)  Result Date: 04/21/2021 CLINICAL DATA:  Altered mental status. EXAM: CT HEAD WITHOUT CONTRAST TECHNIQUE: Contiguous axial images were obtained from the base of the skull through the vertex without intravenous contrast. COMPARISON:  March 23, 2021 FINDINGS: Brain: There is mild cerebral atrophy with widening of the extra-axial spaces and ventricular dilatation. There are areas of decreased attenuation within the white matter tracts of the supratentorial brain, consistent with microvascular disease changes. Vascular: No hyperdense vessel or unexpected calcification. Skull: Normal. Negative for fracture or focal lesion. Sinuses/Orbits: No acute finding. Other: None. IMPRESSION: 1. Generalized cerebral atrophy. 2. No  acute intracranial abnormality. Electronically Signed   By: Virgina Norfolk M.D.   On: 04/21/2021 00:43   DG Chest Portable 1 View  Result Date: 04/20/2021 CLINICAL DATA:  Shortness of breath and hypotension. EXAM: PORTABLE CHEST 1 VIEW COMPARISON:  March 30, 2021 FINDINGS: There is no evidence of acute infiltrate, pleural effusion or pneumothorax. Mild, stable prominence of the pulmonary vasculature is seen. The cardiac silhouette is mildly enlarged and unchanged in size. Degenerative changes seen throughout the thoracic spine. IMPRESSION: Stable cardiomegaly and pulmonary vascular congestion. Electronically Signed   By: Virgina Norfolk M.D.   On: 04/20/2021 22:42        Scheduled Meds:  aspirin EC  81 mg Oral QHS   atorvastatin  20 mg Oral QHS   buPROPion  150 mg Oral BID   cholecalciferol  1,000 Units Oral QHS   enoxaparin (LOVENOX) injection  0.5 mg/kg Subcutaneous Q24H   gabapentin  300 mg Oral TID   levothyroxine  88 mcg Oral Daily   multivitamin-lutein  1 capsule Oral BID   mupirocin ointment   Topical Daily   patiromer  8.4 g Oral Daily   propranolol  40 mg Oral Daily   Ensure Max Protein  11 oz Oral BID   [START ON 04/22/2021] cyanocobalamin  1,000 mcg Oral Once per day on Sun Wed   Continuous Infusions:  albumin human     furosemide (LASIX) 200 mg in dextrose 5% 100 mL ('2mg'$ /mL) infusion       LOS: 0 days    Time spent: ICU time 45 minutes    Sharen Hones, MD [facility]   To contact the attending provider between 7A-7P or the covering provider during after hours 7P-7A, please log into the web site [email or web address] and access using universal [place] password for that web site. If you do not have the password, please call the hospital operator.  04/21/2021, 12:31 PM

## 2021-04-21 NOTE — ED Notes (Signed)
Pt up in bed watching TV. NADN Pt ate all of meal and states he would like to call this daughter later. Pt was placed back on Bi-PAP by self, Lasix drip infusion, and foley placed.

## 2021-04-21 NOTE — ED Notes (Signed)
Awaiting lasix gtt from pharmacy, patient currently on 4L [place], oxygen 98%. Per MD patient to be placed back on Bipap for Co2 retention.

## 2021-04-21 NOTE — ED Notes (Signed)
Patient transported to CT via stretcher on 15L non-rebreather

## 2021-04-21 NOTE — Progress Notes (Signed)
*  PRELIMINARY RESULTS* Echocardiogram 2D Echocardiogram has been performed.  Sherrie Sport 04/21/2021, 10:38 AM

## 2021-04-21 NOTE — H&P (Addendum)
[place]   PATIENT NAME: Christopher [name]    MR#:  [id number]  DATE OF BIRTH:  [birth year]  DATE OF ADMISSION:  04/20/2021  PRIMARY CARE PHYSICIAN: Rusty Aus, MD   Patient is coming from: [facility]  REQUESTING/REFERRING PHYSICIAN: Arta Silence, MD  CHIEF COMPLAINT:   Chief Complaint  Patient presents with   Shortness of Breath   Hypotension    HISTORY OF PRESENT ILLNESS:  Christopher [name] is a 70 y.o. [name] with medical history significant for diastolic CHF, depression, anxiety, hypothyroidism and sleep apnea, chronic respiratory failure on 2 L of O2 by nasal cannula, who presented to the emergency room with acute onset of altered mental status with associated dyspnea and hypoxia at his rehab facility.  His nasal cannula was partially off and O2 sat dropped to the low 80s.  He has been confused and somnolent over the last few weeks.  No history could be obtained from the patient as he was fairly somnolent during my interview.  ED Course: Upon presentation to the emergency room, heart rate 56 and respiratory rate was 22 and pulse currently was 96% on 4 L of O2 by nasal cannula with temperature of 98.8.  Labs revealed after 2 hours of BiPAP ABG with pH 7.33, same as previous ABG, PCO2 of 80 similar to previous level, and PO2 of 77 compared to 74 with HCO3 42.2 similar to prior value and O2 sat of 94.3% on 4 L of O2 by nasal cannula compared to 93.6.  High-sensitivity troponin I was 25 and later 23.  Influenza antigens and COVID-19 PCR came back negative.  CMP was remarkable for hyperkalemia 5.9 and a BUN of 34 with creatinine of 2.04 with albumin of 3.  BNP was 1577.7 and CBC was unremarkable. EKG as reviewed by me : Showed mild sinus bradycardia with a rate of 56 with right axis deviation with T wave inversion anterolaterally. Imaging: Chest x-ray showed stable cardiomegaly and pulmonary vascular congestion.  The patient was given 80 mg IV Lasix and was placed on  BiPAP.  He will be admitted to a progressive unit bed for further evaluation and management. PAST MEDICAL HISTORY:   Past Medical History:  Diagnosis Date   Anxiety    CHF (congestive heart failure) ([place])    Depression    Dysrhythmia    Hypothyroidism    Sleep apnea     PAST SURGICAL HISTORY:   Past Surgical History:  Procedure Laterality Date   FRACTURE SURGERY     ORIF ANKLE FRACTURE Left 12/17/2020   Procedure: OPEN REDUCTION INTERNAL FIXATION (ORIF) ANKLE FRACTURE;  Surgeon: Samara Deist, DPM;  Location: [facility];  Service: Podiatry;  Laterality: Left;    SOCIAL HISTORY:   Social History   Tobacco Use   Smoking status: Never   Smokeless tobacco: Never  Substance Use Topics   Alcohol use: Not Currently    FAMILY HISTORY:   Family History  Problem Relation Age of Onset   Hypertension Mother     DRUG ALLERGIES:   Allergies  Allergen Reactions   Albuterol Other (See Comments)    Causes afib    Gramineae Pollens Other (See Comments)    Sneeze and watery eyes Sneeze and watery eyes   Other Other (See Comments)    Other reaction(s): Other (See Comments) Unable to breath when around cats Other reaction(s): Other (See Comments) Unable to breath when around cats    REVIEW OF SYSTEMS:  ROS As per history of present illness. All pertinent systems were reviewed above. Constitutional, HEENT, cardiovascular, respiratory, GI, GU, musculoskeletal, neuro, psychiatric, endocrine, integumentary and hematologic systems were reviewed and are otherwise negative/unremarkable except for positive findings mentioned above in the HPI.   MEDICATIONS AT HOME:   Prior to Admission medications   Medication Sig Start Date End Date Taking? Authorizing Provider  aspirin 81 MG EC tablet Take 81 mg by mouth at bedtime.   Yes [provider]  atorvastatin (LIPITOR) 20 MG tablet Take 20 mg by mouth at bedtime. 10/06/20  Yes [provider]  buPROPion (WELLBUTRIN  SR) 150 MG 12 hr tablet Take 150 mg by mouth 2 (two) times daily. After 1st meal of day and after dinner 12/02/20  Yes [provider]  Cholecalciferol 25 MCG (1000 UT) capsule Take 1,000 Units by mouth at bedtime.   Yes [provider]  cyanocobalamin 1000 MCG tablet Take 1,000 mcg by mouth 2 (two) times a week. At bedtime. Sundays and Wednesdays   Yes [provider]  Ensure Max Protein (ENSURE MAX PROTEIN) LIQD Take 330 mLs (11 oz total) by mouth 2 (two) times daily. 03/19/21  Yes Lorella Nimrod, MD  furosemide (LASIX) 40 MG tablet Take 1 tablet (40 mg total) by mouth every other day. After 1st meal of the day 03/19/21  Yes Lorella Nimrod, MD  gabapentin (NEURONTIN) 300 MG capsule Take 300 mg by mouth 3 (three) times daily. After 1st meal of day, after dinner, and at bedtime 04/07/20  Yes [provider]  ibuprofen (ADVIL) 200 MG tablet Take 400-600 mg by mouth every 6 (six) hours as needed for mild pain or moderate pain.   Yes [provider]  levalbuterol (XOPENEX HFA) 45 MCG/ACT inhaler Inhale 2 puffs into the lungs every 4 (four) hours as needed for wheezing.   Yes [provider]  levalbuterol (XOPENEX) 0.63 MG/3ML nebulizer solution Take 3 mLs (0.63 mg total) by nebulization every 6 (six) hours as needed for wheezing or shortness of breath. 03/24/21  Yes Fritzi Mandes, MD  levothyroxine (SYNTHROID) 88 MCG tablet Take 88 mcg by mouth daily. 30 to 60 minutes before breakfast on an empty stomach and with a glass of water 12/02/20  Yes [provider]  Multiple Vitamins-Minerals (PRESERVISION AREDS 2) CAPS Take 1 capsule by mouth 2 (two) times daily. With food. After 1st meal of day and after dinner   Yes [provider]  mupirocin ointment (BACTROBAN) 2 % Apply topically daily. 03/19/21  Yes Lorella Nimrod, MD  OXYGEN Inhale 2 L into the lungs continuous.   Yes [provider]  potassium chloride (KLOR-CON) 10 MEQ tablet Take 10  mEq by mouth 2 (two) times daily. After 1st meal of day and after dinner 10/21/20  Yes [provider]  propranolol (INDERAL) 40 MG tablet Take 40 mg by mouth daily. After dinner 01/01/21  Yes [provider]  trolamine salicylate (ASPERCREME) 10 % cream Apply 1 application topically as needed for muscle pain. Apply to knees and feet   Yes [provider]  Vitamins A & D (VITAMIN A & D) ointment Apply 1 application topically as needed. To groin area.   Yes [provider]      VITAL SIGNS:  Blood pressure (!) 97/57, pulse (!) 51, temperature 98.8 F (37.1 C), temperature source Axillary, resp. rate (!) 21, SpO2 94 %.  PHYSICAL EXAMINATION:  Physical Exam  GENERAL:  Christopher y.o.-year-old Caucasian [name] patient lying  in the bed with no acute distress.  EYES: Pupils equal, round, reactive to light and accommodation. No scleral icterus. Extraocular muscles intact.  HEENT: Head atraumatic, normocephalic. Oropharynx and nasopharynx clear.  NECK:  Supple, no jugular venous distention. No thyroid enlargement, no tenderness.  LUNGS: Diminished bibasal breath sounds with bibasal rales. CARDIOVASCULAR: Regular rate and rhythm, S1, S2 normal. No murmurs, rubs, or gallops.  ABDOMEN: Soft, nondistended, nontender. Bowel sounds present. No organomegaly or mass.  EXTREMITIES: 2+ bilateral lower extremity pitting edema with no cyanosis, or clubbing.  NEUROLOGIC: Cranial nerves II through XII are intact. Muscle strength 5/5 in all extremities. Sensation intact. Gait not checked.  PSYCHIATRIC: The patient is alert and oriented x 3.  Normal affect and good eye contact. SKIN: No obvious rash, lesion, or ulcer.   LABORATORY PANEL:   CBC Recent Labs  Lab 04/20/21 2222  WBC 5.5  HGB 14.1  HCT 48.7  PLT 223   ------------------------------------------------------------------------------------------------------------------  Chemistries  Recent Labs  Lab 04/20/21 2222  NA  140  K 5.9*  CL 98  CO2 38*  GLUCOSE 106*  BUN 34*  CREATININE 2.04*  CALCIUM 8.8*  AST 16  ALT 10  ALKPHOS 46  BILITOT 0.9   ------------------------------------------------------------------------------------------------------------------  Cardiac Enzymes No results for input(s): TROPONINI in the last 168 hours. ------------------------------------------------------------------------------------------------------------------  RADIOLOGY:  CT HEAD WO CONTRAST (5MM)  Result Date: 04/21/2021 CLINICAL DATA:  Altered mental status. EXAM: CT HEAD WITHOUT CONTRAST TECHNIQUE: Contiguous axial images were obtained from the base of the skull through the vertex without intravenous contrast. COMPARISON:  March 23, 2021 FINDINGS: Brain: There is mild cerebral atrophy with widening of the extra-axial spaces and ventricular dilatation. There are areas of decreased attenuation within the white matter tracts of the supratentorial brain, consistent with microvascular disease changes. Vascular: No hyperdense vessel or unexpected calcification. Skull: Normal. Negative for fracture or focal lesion. Sinuses/Orbits: No acute finding. Other: None. IMPRESSION: 1. Generalized cerebral atrophy. 2. No acute intracranial abnormality. Electronically Signed   By: Virgina Norfolk M.D.   On: 04/21/2021 00:43   DG Chest Portable 1 View  Result Date: 04/20/2021 CLINICAL DATA:  Shortness of breath and hypotension. EXAM: PORTABLE CHEST 1 VIEW COMPARISON:  March 30, 2021 FINDINGS: There is no evidence of acute infiltrate, pleural effusion or pneumothorax. Mild, stable prominence of the pulmonary vasculature is seen. The cardiac silhouette is mildly enlarged and unchanged in size. Degenerative changes seen throughout the thoracic spine. IMPRESSION: Stable cardiomegaly and pulmonary vascular congestion. Electronically Signed   By: Virgina Norfolk M.D.   On: 04/20/2021 22:42      IMPRESSION AND PLAN:  Active Problems:    Acute respiratory failure ([place])  1.  Acute on chronic respiratory failure with hypoxia and hypercarbia secondary to acute on chronic diastolic CHF and obesity hypoventilation syndrome/obstructive sleep apnea..  The patient has associated acute kidney injury superimposed on stage IIIa chronic kidney disease, likely prerenal secondary to his acute CHF, with subsequent hyperkalemia. - The patient be admitted to a progressive unit bed. - We will continue diuresis with IV Lasix. - We will continue the patient on BiPAP tonight and increase rate following ABG. - We will follow serial troponins. - Cardiology consult and 2D echo will be obtained. - I notified Dr. Saralyn Pilar about the patient. - We will manage his hyperkalemia. - We will follow renal functions with diuresis.  2.  Dyslipidemia. - We will continue statin therapy.  3.  Depression. - We will continue butyrin SR.  4.  Vitamin B12 deficiency. - We will continue vitamin B12.  5.  Hypothyroidism. - We will continue Synthroid.  DVT prophylaxis: Lovenox. Code Status: full code. Family Communication:  The plan of care was discussed in details with the patient (and family). I answered all questions. The patient agreed to proceed with the above mentioned plan. Further management will depend upon hospital course. Disposition Plan: Back to previous home environment Consults called: Cardiology. All the records are reviewed and case discussed with ED provider.  Status is: Inpatient  Remains inpatient appropriate because:Hemodynamically unstable, Ongoing diagnostic testing needed not appropriate for outpatient work up, Unsafe d/c plan, IV treatments appropriate due to intensity of illness or inability to take PO, and Inpatient level of care appropriate due to severity of illness  Dispo: The patient is from: SNF              Anticipated d/c is to: SNF              Patient currently is not medically stable to d/c.   Difficult to place  patient No      TOTAL TIME TAKING CARE OF THIS PATIENT: 55 minutes.    Christel Mormon M.D on 04/21/2021 at 2:13 AM  [facility]   From 7 PM-7 AM, contact night-coverage [email or web address]  CC: Primary care physician; Rusty Aus, MD

## 2021-04-21 NOTE — ED Notes (Signed)
Pt return from CT. Bipap reapplied

## 2021-04-21 NOTE — ED Notes (Addendum)
RT called to draw repeat ABG

## 2021-04-21 NOTE — ED Notes (Signed)
RT at bedside to place patient back on bipap, patient A&0x2.

## 2021-04-21 NOTE — ED Notes (Signed)
ECHO in progress- 

## 2021-04-21 NOTE — Progress Notes (Signed)
Cross Cover Patient with reported with cardiopulmonary deterioration since January in notes. He is also hypothyroid, no recent TSH. One has been added to am labs Patient is borderline hypotensive and is bradycardic. His propranolol has been stopped. He has received 50 gm albumin and a dose of idodrine has been ordered.

## 2021-04-21 NOTE — ED Notes (Signed)
Robin RN aware of assigned bed

## 2021-04-21 NOTE — ED Notes (Signed)
Requested albumin from pharmacy

## 2021-04-21 NOTE — ED Notes (Signed)
Patient taken off bipap so he would be able to eat dinner, patient states "where am I? How long have I been here?" patient updated on plan of care. Assisted with eating dinner.

## 2021-04-21 NOTE — ED Notes (Signed)
Nephrology at bedside to assess patient.

## 2021-04-21 NOTE — Consult Note (Signed)
[facility] Consult Note: 04/21/2021    Date of Admission:  04/20/2021           Reason for Consult:AKI     Referring Provider: Sharen Hones, MD Primary Care Provider: Rusty Aus, MD   History of Presenting Illness:  Christopher [name] is a 70 y.o. male  Patient has medical problems of diastolic CHF, depression, anxiety, hypothyroidism, sleep apnea, chronic respiratory failure requiring chronic oxygen supplementation.  He presented to the emergency room with acute onset of altered mental status along with dyspnea and hypoxia at his rehab facility.  Patient is very lethargic and is only able to answer simple yes/no questions.  He is currently on BiPAP. He was noticed to be hypoxic and confused at the rehab facility therefore he was referred for evaluation In the ER, he was noted to be afebrile, ABG showed high PCO2.  Influenza and COVID-19 tests are negative.  He was also noted to have hyperkalemia as well as elevated creatinine.  Nephrology consult was requested for further evaluation. Chest x-ray showed cardiomegaly and pulmonary vascular congestion. Patient was placed on IV furosemide, given IV albumin for hypotension and placed on BiPAP. He is admitted for further evaluation and management  Review of Systems: ROS Is not obtained because patient is very lethargic and not able to provide meaningful information  Past Medical History:  Diagnosis Date   Anxiety    CHF (congestive heart failure) (HCC)    Depression    Dysrhythmia    Hypothyroidism    Sleep apnea     Social History   Tobacco Use   Smoking status: Never   Smokeless tobacco: Never  Vaping Use   Vaping Use: Never used  Substance Use Topics   Alcohol use: Not Currently   Drug use: Never    Family History  Problem Relation Age of Onset   Hypertension Mother      OBJECTIVE: Blood pressure (!) 86/43, pulse (!) 52, temperature 98.8 F (37.1 C), temperature source Axillary, resp. rate  18, weight (!) 168 kg, SpO2 100 %.  Physical Exam  General: Morbidly obese gentleman, laying in the bed HEENT: Anicteric, round pupils.  NIPPV mask in place Neck: No masses Pulmonary: Normal respiratory effort, mild to moderate bilateral crackles at the lung bases Cardiovascular: Bradycardic, regular Abdomen: Obese, nondistended, nontender Extremities: 2+ edema over lower shins and ankles Skin: Discoloration from chronic edema over lower legs Neuro: Very somnolent.  Able to follow simple commands and answer yes/no questions Foley catheter in place  Lab Results Lab Results  Component Value Date   WBC 5.1 04/21/2021   HGB 13.9 04/21/2021   HCT 47.2 04/21/2021   MCV 96.7 04/21/2021   PLT 178 04/21/2021    Lab Results  Component Value Date   CREATININE 2.13 (H) 04/21/2021   BUN 36 (H) 04/21/2021   NA 141 04/21/2021   K 5.2 (H) 04/21/2021   CL 97 (L) 04/21/2021   CO2 36 (H) 04/21/2021    Lab Results  Component Value Date   ALT 10 04/20/2021   AST 16 04/20/2021   ALKPHOS 46 04/20/2021   BILITOT 0.9 04/20/2021     Microbiology: Recent Results (from the past 240 hour(s))  Resp Panel by RT-PCR (Flu A&B, Covid) Nasopharyngeal Swab     Status: None   Collection Time: 04/20/21 10:47 PM   Specimen: Nasopharyngeal Swab; Nasopharyngeal(NP) swabs in vial transport medium  Result Value Ref Range Status   SARS Coronavirus 2 by RT  PCR NEGATIVE NEGATIVE Final    Comment: (NOTE) SARS-CoV-2 target nucleic acids are NOT DETECTED.  The SARS-CoV-2 RNA is generally detectable in upper respiratory specimens during the acute phase of infection. The lowest concentration of SARS-CoV-2 viral copies this assay can detect is 138 copies/mL. A negative result does not preclude SARS-Cov-2 infection and should not be used as the sole basis for treatment or other patient management decisions. A negative result may occur with  improper specimen collection/handling, submission of specimen  other than nasopharyngeal swab, presence of viral mutation(s) within the areas targeted by this assay, and inadequate number of viral copies(<138 copies/mL). A negative result must be combined with clinical observations, patient history, and epidemiological information. The expected result is Negative.  Fact Sheet for Patients:  EntrepreneurPulse.com.au  Fact Sheet for Healthcare Providers:  IncredibleEmployment.be  This test is no t yet approved or cleared by the [place] FDA and  has been authorized for detection and/or diagnosis of SARS-CoV-2 by FDA under an Emergency Use Authorization (EUA). This EUA will remain  in effect (meaning this test can be used) for the duration of the COVID-19 declaration under Section 564(b)(1) of the Act, 21 U.S.C.section 360bbb-3(b)(1), unless the authorization is terminated  or revoked sooner.       Influenza A by PCR NEGATIVE NEGATIVE Final   Influenza B by PCR NEGATIVE NEGATIVE Final    Comment: (NOTE) The Xpert Xpress SARS-CoV-2/FLU/RSV plus assay is intended as an aid in the diagnosis of influenza from Nasopharyngeal swab specimens and should not be used as a sole basis for treatment. Nasal washings and aspirates are unacceptable for Xpert Xpress SARS-CoV-2/FLU/RSV testing.  Fact Sheet for Patients: EntrepreneurPulse.com.au  Fact Sheet for Healthcare Providers: IncredibleEmployment.be  This test is not yet approved or cleared by the [place] FDA and has been authorized for detection and/or diagnosis of SARS-CoV-2 by FDA under an Emergency Use Authorization (EUA). This EUA will remain in effect (meaning this test can be used) for the duration of the COVID-19 declaration under Section 564(b)(1) of the Act, 21 U.S.C. section 360bbb-3(b)(1), unless the authorization is terminated or revoked.  Performed at [facility], [address]  [place], [place] [address]     Medications: Scheduled Meds:  aspirin EC  81 mg Oral QHS   atorvastatin  20 mg Oral QHS   buPROPion  150 mg Oral BID   cholecalciferol  1,000 Units Oral QHS   enoxaparin (LOVENOX) injection  0.5 mg/kg Subcutaneous Q24H   gabapentin  300 mg Oral TID   levothyroxine  88 mcg Oral Daily   methylPREDNISolone (SOLU-MEDROL) injection  40 mg Intravenous Q24H   multivitamin-lutein  1 capsule Oral BID   mupirocin ointment   Topical Daily   patiromer  8.4 g Oral Daily   propranolol  40 mg Oral Daily   Ensure Max Protein  11 oz Oral BID   [START ON 04/22/2021] cyanocobalamin  1,000 mcg Oral Once per day on Sun Wed   Continuous Infusions:  furosemide (LASIX) 200 mg in dextrose 5% 100 mL ('2mg'$ /mL) infusion     PRN Meds:.acetaminophen **OR** acetaminophen, iohexol, levalbuterol, magnesium hydroxide, ondansetron **OR** ondansetron (ZOFRAN) IV, traZODone, vitamin A & D  Allergies  Allergen Reactions   Albuterol Other (See Comments)    Causes afib    Gramineae Pollens Other (See Comments)    Sneeze and watery eyes Sneeze and watery eyes   Other Other (See Comments)    Other reaction(s): Other (See Comments) Unable to breath  when around cats Other reaction(s): Other (See Comments) Unable to breath when around cats    Urinalysis: No results for input(s): COLORURINE, LABSPEC, PHURINE, GLUCOSEU, HGBUR, BILIRUBINUR, KETONESUR, PROTEINUR, UROBILINOGEN, NITRITE, LEUKOCYTESUR in the last 72 hours.  Invalid input(s): APPERANCEUR    Imaging: CT HEAD WO CONTRAST (5MM)  Result Date: 04/21/2021 CLINICAL DATA:  Altered mental status. EXAM: CT HEAD WITHOUT CONTRAST TECHNIQUE: Contiguous axial images were obtained from the base of the skull through the vertex without intravenous contrast. COMPARISON:  March 23, 2021 FINDINGS: Brain: There is mild cerebral atrophy with widening of the extra-axial spaces and ventricular dilatation. There are areas of decreased attenuation within  the white matter tracts of the supratentorial brain, consistent with microvascular disease changes. Vascular: No hyperdense vessel or unexpected calcification. Skull: Normal. Negative for fracture or focal lesion. Sinuses/Orbits: No acute finding. Other: None. IMPRESSION: 1. Generalized cerebral atrophy. 2. No acute intracranial abnormality. Electronically Signed   By: Virgina Norfolk M.D.   On: 04/21/2021 00:43   DG Chest Portable 1 View  Result Date: 04/20/2021 CLINICAL DATA:  Shortness of breath and hypotension. EXAM: PORTABLE CHEST 1 VIEW COMPARISON:  March 30, 2021 FINDINGS: There is no evidence of acute infiltrate, pleural effusion or pneumothorax. Mild, stable prominence of the pulmonary vasculature is seen. The cardiac silhouette is mildly enlarged and unchanged in size. Degenerative changes seen throughout the thoracic spine. IMPRESSION: Stable cardiomegaly and pulmonary vascular congestion. Electronically Signed   By: Virgina Norfolk M.D.   On: 04/20/2021 22:42   ECHOCARDIOGRAM COMPLETE  Result Date: 04/21/2021    ECHOCARDIOGRAM REPORT   Patient Name:   Christopher [name] Date of Exam: 04/21/2021 Medical Rec #:  [id number]      Height:       71.0 in Accession #:    [id number]     Weight:       370.4 lb Date of Birth:  [birth year]      BSA:          2.740 m Patient Age:    72 years       BP:           94/57 mmHg Patient Gender: M              HR:           50 bpm. Exam Location:  [facility] Procedure: 2D Echo, Cardiac Doppler and Color Doppler Indications:     CHF- acute diastolic [id number]  History:         Patient has no prior history of Echocardiogram examinations.                  CHF; Risk Factors:Sleep Apnea.  Sonographer:     Sherrie Sport Referring Phys:  [id number] [place] Diagnosing Phys: Isaias Cowman MD  Sonographer Comments: Technically challenging study due to limited acoustic windows and no apical window. IMPRESSIONS  1. Left ventricular ejection fraction, by estimation, is 60 to 65%. The  left ventricle has normal function. The left ventricle has no regional wall motion abnormalities. Left ventricular diastolic parameters are indeterminate.  2. Right ventricular systolic function is normal. The right ventricular size is normal.  3. The mitral valve is normal in structure. Mild mitral valve regurgitation. No evidence of mitral stenosis.  4. The aortic valve is normal in structure. Aortic valve regurgitation is not visualized. No aortic stenosis is present.  5. The inferior vena cava is normal in size with greater than 50% respiratory variability, suggesting right atrial  pressure of 3 mmHg. FINDINGS  Left Ventricle: Left ventricular ejection fraction, by estimation, is 60 to 65%. The left ventricle has normal function. The left ventricle has no regional wall motion abnormalities. The left ventricular internal cavity size was normal in size. There is  no left ventricular hypertrophy. Left ventricular diastolic parameters are indeterminate. Right Ventricle: The right ventricular size is normal. No increase in right ventricular wall thickness. Right ventricular systolic function is normal. Left Atrium: Left atrial size was normal in size. Right Atrium: Right atrial size was normal in size. Pericardium: There is no evidence of pericardial effusion. Mitral Valve: The mitral valve is normal in structure. Mild mitral valve regurgitation. No evidence of mitral valve stenosis. Tricuspid Valve: The tricuspid valve is normal in structure. Tricuspid valve regurgitation is mild . No evidence of tricuspid stenosis. Aortic Valve: The aortic valve is normal in structure. Aortic valve regurgitation is not visualized. No aortic stenosis is present. Pulmonic Valve: The pulmonic valve was normal in structure. Pulmonic valve regurgitation is not visualized. No evidence of pulmonic stenosis. Aorta: The aortic root is normal in size and structure. Venous: The inferior vena cava is normal in size with greater than 50%  respiratory variability, suggesting right atrial pressure of 3 mmHg. IAS/Shunts: No atrial level shunt detected by color flow Doppler.  LEFT VENTRICLE PLAX 2D LVIDd:         5.07 cm LVIDs:         3.44 cm LV PW:         1.78 cm LV IVS:        0.99 cm LVOT diam:     2.00 cm LVOT Area:     3.14 cm  LEFT ATRIUM         Index LA diam:    4.00 cm 1.46 cm/m                        PULMONIC VALVE AORTA                 PV Vmax:        0.62 m/s Ao Root diam: 3.60 cm PV Peak grad:   1.5 mmHg                       RVOT Peak grad: 3 mmHg   SHUNTS Systemic Diam: 2.00 cm Isaias Cowman MD Electronically signed by Isaias Cowman MD Signature Date/Time: 04/21/2021/1:22:05 PM    Final       Assessment/Plan:  Christopher [name] is a 70 y.o. male with medical problems of morbid obesity, diastolic CHF, depression, anxiety, hypothyroidism, sleep apnea, chronic respiratory failure with 2 L oxygen supplementation via nasal cannula  was admitted on 04/20/2021 for :  Acute respiratory failure ([place]) [J96.00]  04/21/2021: LVEF 60 to 65%, indeterminate diastolic parameters, right ventricular size and function is normal  #Acute kidney injury on chronic kidney disease stage IIIa Baseline creatinine of 1.49/GFR 50 from 03/30/2021 AKI likely secondary to cardiorenal syndrome from abnormal hemodynamics. Patient is currently hypotensive -Consider decreasing the dose of gabapentin as high-dose may be contributing to his somnolence.  #Acute on chronic respiratory failure, hypercapnic with components of fluid overload in the setting of acute on chronic diastolic CHF #Morbid obesity #Obesity hypoventilation syndrome #Obstructive sleep apnea #Hypotension -Patient is currently being managed with IV furosemide infusion and IV albumin supplementation -Can consider adding midodrine to avoid hypotension, when patient is safely able to take oral tablets.   #  Hyperkalemia -Patient was getting potassium chloride supplementation as  outpatient.  Agree with holding -Agree with [place] supplementation   Christopher [name] Candiss Norse 04/21/21

## 2021-04-21 NOTE — ED Notes (Signed)
MD aware of BP

## 2021-04-21 NOTE — Plan of Care (Signed)
Educated patient on importance of bipap

## 2021-04-21 NOTE — ED Notes (Signed)
RT at bedside.

## 2021-04-21 NOTE — ED Notes (Signed)
MD requested patient be trial off bipap, RT aware.

## 2021-04-22 DIAGNOSIS — G4733 Obstructive sleep apnea (adult) (pediatric): Secondary | ICD-10-CM

## 2021-04-22 DIAGNOSIS — J9621 Acute and chronic respiratory failure with hypoxia: Secondary | ICD-10-CM | POA: Diagnosis not present

## 2021-04-22 DIAGNOSIS — I10 Essential (primary) hypertension: Secondary | ICD-10-CM | POA: Diagnosis not present

## 2021-04-22 DIAGNOSIS — N178 Other acute kidney failure: Secondary | ICD-10-CM | POA: Diagnosis not present

## 2021-04-22 DIAGNOSIS — I5033 Acute on chronic diastolic (congestive) heart failure: Secondary | ICD-10-CM | POA: Diagnosis not present

## 2021-04-22 DIAGNOSIS — Z6841 Body Mass Index (BMI) 40.0 and over, adult: Secondary | ICD-10-CM

## 2021-04-22 LAB — CBC WITH DIFFERENTIAL/PLATELET
Abs Immature Granulocytes: 0.02 10*3/uL (ref 0.00–0.07)
Basophils Absolute: 0 10*3/uL (ref 0.0–0.1)
Basophils Relative: 1 %
Eosinophils Absolute: 0 10*3/uL (ref 0.0–0.5)
Eosinophils Relative: 0 %
HCT: 41.6 % (ref 39.0–52.0)
Hemoglobin: 12.1 g/dL — ABNORMAL LOW (ref 13.0–17.0)
Immature Granulocytes: 1 %
Lymphocytes Relative: 22 %
Lymphs Abs: 0.8 10*3/uL (ref 0.7–4.0)
MCH: 27.3 pg (ref 26.0–34.0)
MCHC: 29.1 g/dL — ABNORMAL LOW (ref 30.0–36.0)
MCV: 93.9 fL (ref 80.0–100.0)
Monocytes Absolute: 0.4 10*3/uL (ref 0.1–1.0)
Monocytes Relative: 11 %
Neutro Abs: 2.3 10*3/uL (ref 1.7–7.7)
Neutrophils Relative %: 65 %
Platelets: 187 10*3/uL (ref 150–400)
RBC: 4.43 MIL/uL (ref 4.22–5.81)
RDW: 16 % — ABNORMAL HIGH (ref 11.5–15.5)
WBC: 3.5 10*3/uL — ABNORMAL LOW (ref 4.0–10.5)
nRBC: 0 % (ref 0.0–0.2)

## 2021-04-22 LAB — BASIC METABOLIC PANEL
Anion gap: 11 (ref 5–15)
BUN: 43 mg/dL — ABNORMAL HIGH (ref 8–23)
CO2: 39 mmol/L — ABNORMAL HIGH (ref 22–32)
Calcium: 9 mg/dL (ref 8.9–10.3)
Chloride: 94 mmol/L — ABNORMAL LOW (ref 98–111)
Creatinine, Ser: 1.93 mg/dL — ABNORMAL HIGH (ref 0.61–1.24)
GFR, Estimated: 37 mL/min — ABNORMAL LOW (ref >60)
Glucose, Bld: 98 mg/dL (ref 70–99)
Potassium: 4.9 mmol/L (ref 3.5–5.1)
Sodium: 144 mmol/L (ref 135–145)

## 2021-04-22 LAB — TSH: TSH: 1.599 u[IU]/mL (ref 0.350–4.500)

## 2021-04-22 LAB — MAGNESIUM: Magnesium: 2.2 mg/dL (ref 1.7–2.4)

## 2021-04-22 MED ORDER — ALBUMIN HUMAN 25 % IV SOLN
25.0000 g | Freq: Once | INTRAVENOUS | Status: AC
  Administered 2021-04-22: 25 g via INTRAVENOUS
  Filled 2021-04-22: qty 100

## 2021-04-22 MED ORDER — MIDODRINE HCL 5 MG PO TABS
10.0000 mg | ORAL_TABLET | Freq: Three times a day (TID) | ORAL | Status: DC
  Administered 2021-04-23 – 2021-04-29 (×19): 10 mg via ORAL
  Filled 2021-04-22 (×19): qty 2

## 2021-04-22 MED ORDER — MIDODRINE HCL 5 MG PO TABS
5.0000 mg | ORAL_TABLET | Freq: Once | ORAL | Status: AC
  Administered 2021-04-22: 5 mg via ORAL
  Filled 2021-04-22: qty 1

## 2021-04-22 MED ORDER — MIDODRINE HCL 5 MG PO TABS
5.0000 mg | ORAL_TABLET | Freq: Three times a day (TID) | ORAL | Status: DC
  Administered 2021-04-22: 5 mg via ORAL
  Filled 2021-04-22: qty 1

## 2021-04-22 MED ORDER — ORAL CARE MOUTH RINSE
15.0000 mL | Freq: Two times a day (BID) | OROMUCOSAL | Status: DC
  Administered 2021-04-22 – 2021-04-27 (×6): 15 mL via OROMUCOSAL

## 2021-04-22 MED ORDER — CHLORHEXIDINE GLUCONATE 0.12 % MT SOLN
15.0000 mL | Freq: Two times a day (BID) | OROMUCOSAL | Status: DC
  Administered 2021-04-22 – 2021-04-29 (×14): 15 mL via OROMUCOSAL
  Filled 2021-04-22 (×12): qty 15

## 2021-04-22 NOTE — Progress Notes (Signed)
[facility]  SUBJECTIVE: Patient laying flat in bed, denies chest pain or shortness of breath   Vitals:   04/22/21 0730 04/22/21 0740 04/22/21 0800 04/22/21 0900  BP:   (!) 97/50 (!) 115/56  Pulse:   (!) 53 (!) 59  Resp:   20 17  Temp: 97.7 F (36.5 C)     TempSrc: Axillary     SpO2:  96% 98% 98%  Weight:      Height:         Intake/Output Summary (Last 24 hours) at 04/22/2021 1023 Last data filed at 04/22/2021 0730 Gross per 24 hour  Intake 27.91 ml  Output 800 ml  Net -772.09 ml      PHYSICAL EXAM  General: Well developed, well nourished, in no acute distress HEENT:  Normocephalic and atramatic Neck:  No JVD.  Lungs: Clear bilaterally to auscultation and percussion. Heart: HRRR . Normal S1 and S2 without gallops or murmurs.  Abdomen: Bowel sounds are positive, abdomen soft and non-tender  Msk:  Back normal, normal gait. Normal strength and tone for age. Extremities: No clubbing, cyanosis or edema.   Neuro: Alert and oriented X 3. Psych:  Good affect, responds appropriately   LABS: Basic Metabolic Panel: Recent Labs    04/21/21 0539 04/22/21 0624  NA 141 144  K 5.2* 4.9  CL 97* 94*  CO2 36* 39*  GLUCOSE 91 98  BUN 36* 43*  CREATININE 2.13* 1.93*  CALCIUM 8.7* 9.0  MG  --  2.2   Liver Function Tests: Recent Labs    04/20/21 2222  AST 16  ALT 10  ALKPHOS 46  BILITOT 0.9  PROT 6.6  ALBUMIN 3.0*   No results for input(s): LIPASE, AMYLASE in the last 72 hours. CBC: Recent Labs    04/20/21 2222 04/21/21 0539 04/22/21 0624  WBC 5.5 5.1 3.5*  NEUTROABS 2.2  --  2.3  HGB 14.1 13.9 12.1*  HCT 48.7 47.2 41.6  MCV 96.2 96.7 93.9  PLT 223 178 187   Cardiac Enzymes: No results for input(s): CKTOTAL, CKMB, CKMBINDEX, TROPONINI in the last 72 hours. BNP: Invalid input(s): POCBNP D-Dimer: No results for input(s): DDIMER in the last 72 hours. Hemoglobin A1C: No results for input(s): HGBA1C in the last 72 hours. Fasting Lipid Panel: No  results for input(s): CHOL, HDL, LDLCALC, TRIG, CHOLHDL, LDLDIRECT in the last 72 hours. Thyroid Function Tests: Recent Labs    04/22/21 0624  TSH 1.599   Anemia Panel: No results for input(s): VITAMINB12, FOLATE, FERRITIN, TIBC, IRON, RETICCTPCT in the last 72 hours.  CT HEAD WO CONTRAST (5MM)  Result Date: 04/21/2021 CLINICAL DATA:  Altered mental status. EXAM: CT HEAD WITHOUT CONTRAST TECHNIQUE: Contiguous axial images were obtained from the base of the skull through the vertex without intravenous contrast. COMPARISON:  March 23, 2021 FINDINGS: Brain: There is mild cerebral atrophy with widening of the extra-axial spaces and ventricular dilatation. There are areas of decreased attenuation within the white matter tracts of the supratentorial brain, consistent with microvascular disease changes. Vascular: No hyperdense vessel or unexpected calcification. Skull: Normal. Negative for fracture or focal lesion. Sinuses/Orbits: No acute finding. Other: None. IMPRESSION: 1. Generalized cerebral atrophy. 2. No acute intracranial abnormality. Electronically Signed   By: Virgina Norfolk M.D.   On: 04/21/2021 00:43   DG Chest Portable 1 View  Result Date: 04/20/2021 CLINICAL DATA:  Shortness of breath and hypotension. EXAM: PORTABLE CHEST 1 VIEW COMPARISON:  March 30, 2021 FINDINGS: There is no evidence  of acute infiltrate, pleural effusion or pneumothorax. Mild, stable prominence of the pulmonary vasculature is seen. The cardiac silhouette is mildly enlarged and unchanged in size. Degenerative changes seen throughout the thoracic spine. IMPRESSION: Stable cardiomegaly and pulmonary vascular congestion. Electronically Signed   By: Virgina Norfolk M.D.   On: 04/20/2021 22:42   ECHOCARDIOGRAM COMPLETE  Result Date: 04/21/2021    ECHOCARDIOGRAM REPORT   Patient Name:   Christopher [name] Date of Exam: 04/21/2021 Medical Rec #:  [id number]      Height:       71.0 in Accession #:    [id number]     Weight:        370.4 lb Date of Birth:  1950-12-12      BSA:          2.740 m Patient Age:    66 years       BP:           94/57 mmHg Patient Gender: M              HR:           50 bpm. Exam Location:  [facility] Procedure: 2D Echo, Cardiac Doppler and Color Doppler Indications:     CHF- acute diastolic [id number]  History:         Patient has no prior history of Echocardiogram examinations.                  CHF; Risk Factors:Sleep Apnea.  Sonographer:     Sherrie Sport Referring Phys:  [id number] [place] Diagnosing Phys: Isaias Cowman MD  Sonographer Comments: Technically challenging study due to limited acoustic windows and no apical window. IMPRESSIONS  1. Left ventricular ejection fraction, by estimation, is 60 to 65%. The left ventricle has normal function. The left ventricle has no regional wall motion abnormalities. Left ventricular diastolic parameters are indeterminate.  2. Right ventricular systolic function is normal. The right ventricular size is normal.  3. The mitral valve is normal in structure. Mild mitral valve regurgitation. No evidence of mitral stenosis.  4. The aortic valve is normal in structure. Aortic valve regurgitation is not visualized. No aortic stenosis is present.  5. The inferior vena cava is normal in size with greater than 50% respiratory variability, suggesting right atrial pressure of 3 mmHg. FINDINGS  Left Ventricle: Left ventricular ejection fraction, by estimation, is 60 to 65%. The left ventricle has normal function. The left ventricle has no regional wall motion abnormalities. The left ventricular internal cavity size was normal in size. There is  no left ventricular hypertrophy. Left ventricular diastolic parameters are indeterminate. Right Ventricle: The right ventricular size is normal. No increase in right ventricular wall thickness. Right ventricular systolic function is normal. Left Atrium: Left atrial size was normal in size. Right Atrium: Right atrial size was normal in size. Pericardium:  There is no evidence of pericardial effusion. Mitral Valve: The mitral valve is normal in structure. Mild mitral valve regurgitation. No evidence of mitral valve stenosis. Tricuspid Valve: The tricuspid valve is normal in structure. Tricuspid valve regurgitation is mild . No evidence of tricuspid stenosis. Aortic Valve: The aortic valve is normal in structure. Aortic valve regurgitation is not visualized. No aortic stenosis is present. Pulmonic Valve: The pulmonic valve was normal in structure. Pulmonic valve regurgitation is not visualized. No evidence of pulmonic stenosis. Aorta: The aortic root is normal in size and structure. Venous: The inferior vena cava is normal in size with greater than  50% respiratory variability, suggesting right atrial pressure of 3 mmHg. IAS/Shunts: No atrial level shunt detected by color flow Doppler.  LEFT VENTRICLE PLAX 2D LVIDd:         5.07 cm LVIDs:         3.44 cm LV PW:         1.78 cm LV IVS:        0.99 cm LVOT diam:     2.00 cm LVOT Area:     3.14 cm  LEFT ATRIUM         Index LA diam:    4.00 cm 1.46 cm/m                        PULMONIC VALVE AORTA                 PV Vmax:        0.62 m/s Ao Root diam: 3.60 cm PV Peak grad:   1.5 mmHg                       RVOT Peak grad: 3 mmHg   SHUNTS Systemic Diam: 2.00 cm Isaias Cowman MD Electronically signed by Isaias Cowman MD Signature Date/Time: 04/21/2021/1:22:05 PM    Final      Echo LVEF 60 to 65% x 2 D echocardiogram 04/21/2021  TELEMETRY: Sinus bradycardia at 59 bpm:  ASSESSMENT AND PLAN:  Active Problems:   Obstructive sleep apnea (adult) (pediatric)   Morbid obesity with BMI of 45.0-49.9, adult (HCC)   Acute on chronic diastolic CHF (congestive heart failure) (HCC)   Acute on chronic respiratory failure with hypoxia and hypercapnia (HCC)   Benign essential hypertension   Acute respiratory failure (HCC)   Acute renal failure superimposed on stage 3a chronic kidney disease ([place])    1.  Acute on  chronic diastolic congestive heart failure, in patient with obesity, obstructive sleep apnea, and chronic respiratory failure on 2 L of oxygen, presents with altered mental status likely due to hypoxia. 2.  Chronic respiratory failure on 2 L by [place] at home 3.  Chronic kidney disease, BUN and creatinine 43 and 1.93, respectively, evaluated by nephrology, started on furosemide drip 4.  Obstructive sleep apnea, currently on BiPAP 5.  Abnormal ECG, in the absence of chest pain, with borderline elevated high-sensitivity troponin (25, 23) without delta 6.  Sinus bradycardia, of uncertain clinical significance, propanolol currently held   Recommendations   1.  Agree with current therapy 2.  Continue furosemide drip 3.  Carefully monitor renal status 4.  Hold propranolol for now 5.  Defer full dose anticoagulation 6.  No further cardiac diagnostics at this time   Isaias Cowman, MD, PhD, [facility] 04/22/2021 10:23 AM

## 2021-04-22 NOTE — Progress Notes (Signed)
Patient remained stable throughout shift. Vitals stable, lasix running. Foley is intact with 1500 output today. Will continue to monitor.

## 2021-04-22 NOTE — Progress Notes (Signed)
[place], [place] 04/22/21  Subjective:   Hospital day # 1  Patient is more alert today.  Overall feeling better.  States he was able to have some breakfast. Serum creatinine is starting to improve 08/30 0701 - 08/31 0700 In: 27.9 [I.V.:27.9] Out: 300 [Urine:300] Lab Results  Component Value Date   CREATININE 1.93 (H) 04/22/2021   CREATININE 2.13 (H) 04/21/2021   CREATININE 2.04 (H) 04/20/2021     Objective:  Vital signs in last 24 hours:  Temp:  [97.5 F (36.4 C)-98.6 F (37 C)] 97.7 F (36.5 C) (08/31 0730) Pulse Rate:  [48-121] 59 (08/31 0900) Resp:  [13-24] 17 (08/31 0900) BP: (70-164)/(35-75) 115/56 (08/31 0900) SpO2:  [84 %-100 %] 98 % (08/31 0900) Weight:  [149.7 kg-162.7 kg] 162.7 kg (08/31 0500)  Weight change:  Filed Weights   04/21/21 1919 04/21/21 2040 04/22/21 0500  Weight: (!) 149.7 kg (!) 162.7 kg (!) 162.7 kg    Intake/Output:    Intake/Output Summary (Last 24 hours) at 04/22/2021 1001 Last data filed at 04/22/2021 0730 Gross per 24 hour  Intake 27.91 ml  Output 800 ml  Net -772.09 ml     Physical Exam: General: Morbidly obese gentleman, laying in the bed  HEENT Anicteric, moist oral mucous membranes  Pulm/lungs [place] O2, mild basilar crackles  CVS/Heart Regular, no rub  Abdomen:  Soft, distended, obese  Extremities: 2+ edema over lower shins and ankles, chronic  Neurologic: Alert, oriented  Skin: Discoloration from chronic leg edema   Foley catheter in place       Basic Metabolic Panel:  Recent Labs  Lab 04/20/21 2222 04/21/21 0539 04/22/21 0624  NA 140 141 144  K 5.9* 5.2* 4.9  CL 98 97* 94*  CO2 38* 36* 39*  GLUCOSE 106* 91 98  BUN 34* 36* 43*  CREATININE 2.04* 2.13* 1.93*  CALCIUM 8.8* 8.7* 9.0  MG  --   --  2.2     CBC: Recent Labs  Lab 04/20/21 2222 04/21/21 0539 04/22/21 0624  WBC 5.5 5.1 3.5*  NEUTROABS 2.2  --  2.3  HGB 14.1 13.9 12.1*  HCT 48.7 47.2 41.6  MCV 96.2 96.7 93.9   PLT 223 178 187     No results found for: HEPBSAG, HEPBSAB, HEPBIGM    Microbiology:  Recent Results (from the past 240 hour(s))  Resp Panel by RT-PCR (Flu A&B, Covid) Nasopharyngeal Swab     Status: None   Collection Time: 04/20/21 10:47 PM   Specimen: Nasopharyngeal Swab; Nasopharyngeal(NP) swabs in vial transport medium  Result Value Ref Range Status   SARS Coronavirus 2 by RT PCR NEGATIVE NEGATIVE Final    Comment: (NOTE) SARS-CoV-2 target nucleic acids are NOT DETECTED.  The SARS-CoV-2 RNA is generally detectable in upper respiratory specimens during the acute phase of infection. The lowest concentration of SARS-CoV-2 viral copies this assay can detect is 138 copies/mL. A negative result does not preclude SARS-Cov-2 infection and should not be used as the sole basis for treatment or other patient management decisions. A negative result may occur with  improper specimen collection/handling, submission of specimen other than nasopharyngeal swab, presence of viral mutation(s) within the areas targeted by this assay, and inadequate number of viral copies(<138 copies/mL). A negative result must be combined with clinical observations, patient history, and epidemiological information. The expected result is Negative.  Fact Sheet for Patients:  EntrepreneurPulse.com.au  Fact Sheet for Healthcare Providers:  IncredibleEmployment.be  This test is no t  yet approved or cleared by the [place] and  has been authorized for detection and/or diagnosis of SARS-CoV-2 by FDA under an Emergency Use Authorization (EUA). This EUA will remain  in effect (meaning this test can be used) for the duration of the COVID-19 declaration under Section 564(b)(1) of the Act, 21 U.S.C.section 360bbb-3(b)(1), unless the authorization is terminated  or revoked sooner.       Influenza A by PCR NEGATIVE NEGATIVE Final   Influenza B by PCR NEGATIVE NEGATIVE  Final    Comment: (NOTE) The Xpert Xpress SARS-CoV-2/FLU/RSV plus assay is intended as an aid in the diagnosis of influenza from Nasopharyngeal swab specimens and should not be used as a sole basis for treatment. Nasal washings and aspirates are unacceptable for Xpert Xpress SARS-CoV-2/FLU/RSV testing.  Fact Sheet for Patients: EntrepreneurPulse.com.au  Fact Sheet for Healthcare Providers: IncredibleEmployment.be  This test is not yet approved or cleared by the [place] FDA and has been authorized for detection and/or diagnosis of SARS-CoV-2 by FDA under an Emergency Use Authorization (EUA). This EUA will remain in effect (meaning this test can be used) for the duration of the COVID-19 declaration under Section 564(b)(1) of the Act, 21 U.S.C. section 360bbb-3(b)(1), unless the authorization is terminated or revoked.  Performed at [facility], [address] [place], [place] [address]   MRSA Next Gen by PCR, Nasal     Status: None   Collection Time: 04/21/21  8:45 PM   Specimen: Nasal Mucosa; Nasal Swab  Result Value Ref Range Status   MRSA by PCR Next Gen NOT DETECTED NOT DETECTED Final    Comment: (NOTE) The GeneXpert MRSA Assay (FDA approved for NASAL specimens only), is one component of a comprehensive MRSA colonization surveillance program. It is not intended to diagnose MRSA infection nor to guide or monitor treatment for MRSA infections. Test performance is not FDA approved in patients less than 67 years old. Performed at [facility], [address] [place], [place] [address]     Coagulation Studies: No results for input(s): LABPROT, INR in the last 72 hours.  Urinalysis: No results for input(s): COLORURINE, LABSPEC, PHURINE, GLUCOSEU, HGBUR, BILIRUBINUR, KETONESUR, PROTEINUR, UROBILINOGEN, NITRITE, LEUKOCYTESUR in the last 72 hours.  Invalid input(s): APPERANCEUR    Imaging: CT HEAD WO CONTRAST  (5MM)  Result Date: 04/21/2021 CLINICAL DATA:  Altered mental status. EXAM: CT HEAD WITHOUT CONTRAST TECHNIQUE: Contiguous axial images were obtained from the base of the skull through the vertex without intravenous contrast. COMPARISON:  March 23, 2021 FINDINGS: Brain: There is mild cerebral atrophy with widening of the extra-axial spaces and ventricular dilatation. There are areas of decreased attenuation within the white matter tracts of the supratentorial brain, consistent with microvascular disease changes. Vascular: No hyperdense vessel or unexpected calcification. Skull: Normal. Negative for fracture or focal lesion. Sinuses/Orbits: No acute finding. Other: None. IMPRESSION: 1. Generalized cerebral atrophy. 2. No acute intracranial abnormality. Electronically Signed   By: Virgina Norfolk M.D.   On: 04/21/2021 00:43   DG Chest Portable 1 View  Result Date: 04/20/2021 CLINICAL DATA:  Shortness of breath and hypotension. EXAM: PORTABLE CHEST 1 VIEW COMPARISON:  March 30, 2021 FINDINGS: There is no evidence of acute infiltrate, pleural effusion or pneumothorax. Mild, stable prominence of the pulmonary vasculature is seen. The cardiac silhouette is mildly enlarged and unchanged in size. Degenerative changes seen throughout the thoracic spine. IMPRESSION: Stable cardiomegaly and pulmonary vascular congestion. Electronically Signed   By: Joyce Gross.D.  On: 04/20/2021 22:42   ECHOCARDIOGRAM COMPLETE  Result Date: 04/21/2021    ECHOCARDIOGRAM REPORT   Patient Name:   Sayed Rydberg Date of Exam: 04/21/2021 Medical Rec #:  [id number]      Height:       71.0 in Accession #:    [id number]     Weight:       370.4 lb Date of Birth:  [birth year]      BSA:          2.740 m Patient Age:    26 years       BP:           94/57 mmHg Patient Gender: M              HR:           50 bpm. Exam Location:  [facility] Procedure: 2D Echo, Cardiac Doppler and Color Doppler Indications:     CHF- acute diastolic [id number]   History:         Patient has no prior history of Echocardiogram examinations.                  CHF; Risk Factors:Sleep Apnea.  Sonographer:     Sherrie Sport Referring Phys:  [id number] [place] Diagnosing Phys: Isaias Cowman MD  Sonographer Comments: Technically challenging study due to limited acoustic windows and no apical window. IMPRESSIONS  1. Left ventricular ejection fraction, by estimation, is 60 to 65%. The left ventricle has normal function. The left ventricle has no regional wall motion abnormalities. Left ventricular diastolic parameters are indeterminate.  2. Right ventricular systolic function is normal. The right ventricular size is normal.  3. The mitral valve is normal in structure. Mild mitral valve regurgitation. No evidence of mitral stenosis.  4. The aortic valve is normal in structure. Aortic valve regurgitation is not visualized. No aortic stenosis is present.  5. The inferior vena cava is normal in size with greater than 50% respiratory variability, suggesting right atrial pressure of 3 mmHg. FINDINGS  Left Ventricle: Left ventricular ejection fraction, by estimation, is 60 to 65%. The left ventricle has normal function. The left ventricle has no regional wall motion abnormalities. The left ventricular internal cavity size was normal in size. There is  no left ventricular hypertrophy. Left ventricular diastolic parameters are indeterminate. Right Ventricle: The right ventricular size is normal. No increase in right ventricular wall thickness. Right ventricular systolic function is normal. Left Atrium: Left atrial size was normal in size. Right Atrium: Right atrial size was normal in size. Pericardium: There is no evidence of pericardial effusion. Mitral Valve: The mitral valve is normal in structure. Mild mitral valve regurgitation. No evidence of mitral valve stenosis. Tricuspid Valve: The tricuspid valve is normal in structure. Tricuspid valve regurgitation is mild . No evidence of  tricuspid stenosis. Aortic Valve: The aortic valve is normal in structure. Aortic valve regurgitation is not visualized. No aortic stenosis is present. Pulmonic Valve: The pulmonic valve was normal in structure. Pulmonic valve regurgitation is not visualized. No evidence of pulmonic stenosis. Aorta: The aortic root is normal in size and structure. Venous: The inferior vena cava is normal in size with greater than 50% respiratory variability, suggesting right atrial pressure of 3 mmHg. IAS/Shunts: No atrial level shunt detected by color flow Doppler.  LEFT VENTRICLE PLAX 2D LVIDd:         5.07 cm LVIDs:         3.44 cm LV PW:  1.78 cm LV IVS:        0.99 cm LVOT diam:     2.00 cm LVOT Area:     3.14 cm  LEFT ATRIUM         Index LA diam:    4.00 cm 1.46 cm/m                        PULMONIC VALVE AORTA                 PV Vmax:        0.62 m/s Ao Root diam: 3.60 cm PV Peak grad:   1.5 mmHg                       RVOT Peak grad: 3 mmHg   SHUNTS Systemic Diam: 2.00 cm Isaias Cowman MD Electronically signed by Isaias Cowman MD Signature Date/Time: 04/21/2021/1:22:05 PM    Final      Medications:    furosemide (LASIX) 200 mg in dextrose 5% 100 mL ('2mg'$ /mL) infusion 4 mg/hr (04/22/21 0700)    aspirin EC  81 mg Oral QHS   atorvastatin  20 mg Oral QHS   buPROPion  150 mg Oral BID   Chlorhexidine Gluconate Cloth  6 each Topical Daily   cholecalciferol  1,000 Units Oral QHS   enoxaparin (LOVENOX) injection  0.5 mg/kg Subcutaneous Q24H   gabapentin  100 mg Oral TID   levothyroxine  88 mcg Oral Daily   methylPREDNISolone (SOLU-MEDROL) injection  40 mg Intravenous Q24H   multivitamin-lutein  1 capsule Oral BID   mupirocin ointment   Topical Daily   patiromer  8.4 g Oral Daily   Ensure Max Protein  11 oz Oral BID   cyanocobalamin  1,000 mcg Oral Once per day on Sun Wed   acetaminophen **OR** acetaminophen, iohexol, levalbuterol, magnesium hydroxide, ondansetron **OR** ondansetron (ZOFRAN) IV,  traZODone, vitamin A & D  Assessment/ Plan:  70 y.o. male with  medical problems of morbid obesity, diastolic CHF, depression, anxiety, hypothyroidism, sleep apnea, chronic respiratory failure with 2 L oxygen supplementation via nasal cannula    admitted on 04/20/2021 for Acute respiratory failure (HCC) [J96.00] Acute on chronic respiratory failure with hypoxia and hypercapnia (HCC) [id number], J96.22]  04/21/2021: LVEF 60 to 65%, indeterminate diastolic parameters, right ventricular size and function is normal   #Acute kidney injury on chronic kidney disease stage IIIa Baseline creatinine of 1.49/GFR 50 from 03/30/2021 AKI likely secondary to cardiorenal syndrome from abnormal hemodynamics. -BP has improved -Gabapentin dose decreased because of renal failure  #Acute on chronic respiratory failure, hypercapnic with components of fluid overload in the setting of acute on chronic diastolic CHF #Morbid obesity #Obesity hypoventilation syndrome #Obstructive sleep apnea #Hypotension -Patient is currently being managed with IV furosemide infusion and IV albumin supplementation -We will add midodrine to avoid hypotension     #Hyperkalemia -Patient was getting potassium chloride supplementation as outpatient.  Agree with holding -Agree with [place] supplementation for now.     LOS: [place] 8/31/202210:01 AM  [place], [place]  Note: This note was prepared with Dragon dictation. Any transcription errors are unintentional

## 2021-04-22 NOTE — Progress Notes (Signed)
PROGRESS NOTE    Christopher [name]  [id number] DOB: [birth year] DOA: 04/20/2021 PCP: Rusty Aus, MD   Chief complaint.  Shortness of breath. Brief Narrative:  Christopher [name] is a 70 y.o. male with medical history significant for diastolic CHF, depression, anxiety, hypothyroidism and sleep apnea, chronic respiratory failure on 2 L of O2 by nasal cannula, who presented to the emergency room with acute onset of altered mental status with associated dyspnea and hypoxia at his rehab facility.  He has a worsening hypoxemia, was placed on BiPAP.  He has a profound elevation of BNP, started on IV Lasix for diastolic congestive heart failure.  Discussed with daughter, patient has had has been deteriorating since January after he had ankle injury.  He has not been able to walk.  He had increased confusion and hallucinations since April.  He has sleep apnea, not compliant with CPAP.  He currently lives in the nursing home. 8/31 was given a dose of midodrine for low BP last night  Assessment & Plan:   Active Problems:   Obstructive sleep apnea (adult) (pediatric)   Morbid obesity with BMI of 45.0-49.9, adult (HCC)   Acute on chronic diastolic CHF (congestive heart failure) (HCC)   Acute on chronic respiratory failure with hypoxia and hypercapnia (HCC)   Benign essential hypertension   Acute respiratory failure (HCC)   Acute renal failure superimposed on stage 3a chronic kidney disease (HCC)  Acute on chronic respite failure with hypoxemia and hypercapnia. Acute on chronic diastolic congestive heart failure. Morbid obesity  Obesity hypoventilation syndrome. Obstructive sleep apnea. Hypotension. Patient had significant volume overload evidenced by worsening short of breath and hypoxemia, elevated BNP, vascular congestion on chest x-ray. Echocardiogram is a pending. ABG has significant CO2 retention, will continue BiPAP.  RT will follow. Patient developed borderline hypotension after IV Lasix  push.  I will change Lasix to IV drip. I will give 50 g albumin for blood pressure. Patient will be followed in the stepdown unit closely, if blood pressure continue to drop or respiratory status is worse, patient can be transferred to ICU. Patient be continued on BiPAP at nighttime if we can get off him today. I will also placed on 40 mg of IV Solu-Medrol in case he has a COPD.  It may also help if patient has a relative adrenal insufficiency. 8/31-BP on low side  Will start midodrine 10 mg 3 times daily Continue Lasix  Acute kidney injury on chronic kidney disease stage IIIa. Hyperkalemia. Reviewed the patient labs, patient has a chronic kidney disease stage IIIa.  Worsening renal function due to diuretics. Albumin is given. 8/31 nephrology following Baseline creatinine 1.49 AKI likely secondary to cardiorenal syndrome from abnormal hemodynamics   Vitamin  B12 deficient anemia. Continue B12 supplementation  Failure to thrive. Delirium. Delirium appears to be secondary to CO2 retention and noncompliant with CPAP  Patient long-term prognosis is poor   Hypothyroidism-continue levothyroxine  DVT prophylaxis: Lovenox Code Status: Full Family Communication: None at bedside Disposition Plan:    Status is: Inpatient  Remains inpatient appropriate because:IV treatments appropriate due to intensity of illness or inability to take PO and Inpatient level of care appropriate due to severity of illness  Dispo: The patient is from: SNF              Anticipated d/c is to: SNF              Patient currently is not medically stable to d/c.   Difficult to place  patient No        I/O last 3 completed shifts: In: 27.9 [I.V.:27.9] Out: 1075 [Urine:1075] Total I/O In: -  Out: 53 [Urine:725]     Consultants:  nephrology  Procedures: none  Antimicrobials: none   Subjective: No dizziness, sob, cp.  Objective: Vitals:   04/22/21 1200 04/22/21 1300 04/22/21 1400 04/22/21  1405  BP:  (!) 97/57 (!) 83/42 (!) 89/59  Pulse:  (!) 56 (!) 51   Resp:  13 (!) 22   Temp: 97.9 F (36.6 C)     TempSrc:      SpO2:  95% 99%   Weight:      Height:        Intake/Output Summary (Last 24 hours) at 04/22/2021 1503 Last data filed at 04/22/2021 1300 Gross per 24 hour  Intake 27.91 ml  Output 1025 ml  Net -997.09 ml   Filed Weights   04/21/21 1919 04/21/21 2040 04/22/21 0500  Weight: (!) 149.7 kg (!) 162.7 kg (!) 162.7 kg    Examination: Nad, calm Decrease bs no wheezing Regular s1/s2 no gallop Soft benign +bs +edema with chronic venous stasis b/l Mood and affect appropriate in current setting     Data Reviewed: I have personally reviewed following labs and imaging studies  CBC: Recent Labs  Lab 04/20/21 2222 04/21/21 0539 04/22/21 0624  WBC 5.5 5.1 3.5*  NEUTROABS 2.2  --  2.3  HGB 14.1 13.9 12.1*  HCT 48.7 47.2 41.6  MCV 96.2 96.7 93.9  PLT 223 178 [id number]   Basic Metabolic Panel: Recent Labs  Lab 04/20/21 2222 04/21/21 0539 04/22/21 0624  NA 140 141 144  K 5.9* 5.2* 4.9  CL 98 97* 94*  CO2 38* 36* 39*  GLUCOSE 106* 91 98  BUN 34* 36* 43*  CREATININE 2.04* 2.13* 1.93*  CALCIUM 8.8* 8.7* 9.0  MG  --   --  2.2   GFR: Estimated Creatinine Clearance: 55.6 mL/min (A) (by C-G formula based on SCr of 1.93 mg/dL (H)). Liver Function Tests: Recent Labs  Lab 04/20/21 2222  AST 16  ALT 10  ALKPHOS 46  BILITOT 0.9  PROT 6.6  ALBUMIN 3.0*   No results for input(s): LIPASE, AMYLASE in the last 168 hours. No results for input(s): AMMONIA in the last 168 hours. Coagulation Profile: No results for input(s): INR, PROTIME in the last 168 hours. Cardiac Enzymes: No results for input(s): CKTOTAL, CKMB, CKMBINDEX, TROPONINI in the last 168 hours. BNP (last 3 results) No results for input(s): PROBNP in the last 8760 hours. HbA1C: No results for input(s): HGBA1C in the last 72 hours. CBG: Recent Labs  Lab 04/21/21 2035  GLUCAP 128*    Lipid Profile: No results for input(s): CHOL, HDL, LDLCALC, TRIG, CHOLHDL, LDLDIRECT in the last 72 hours. Thyroid Function Tests: Recent Labs    04/22/21 0624  TSH 1.599   Anemia Panel: No results for input(s): VITAMINB12, FOLATE, FERRITIN, TIBC, IRON, RETICCTPCT in the last 72 hours. Sepsis Labs: No results for input(s): PROCALCITON, LATICACIDVEN in the last 168 hours.  Recent Results (from the past 240 hour(s))  Resp Panel by RT-PCR (Flu A&B, Covid) Nasopharyngeal Swab     Status: None   Collection Time: 04/20/21 10:47 PM   Specimen: Nasopharyngeal Swab; Nasopharyngeal(NP) swabs in vial transport medium  Result Value Ref Range Status   SARS Coronavirus 2 by RT PCR NEGATIVE NEGATIVE Final    Comment: (NOTE) SARS-CoV-2 target nucleic acids are NOT DETECTED.  The SARS-CoV-2 RNA  is generally detectable in upper respiratory specimens during the acute phase of infection. The lowest concentration of SARS-CoV-2 viral copies this assay can detect is 138 copies/mL. A negative result does not preclude SARS-Cov-2 infection and should not be used as the sole basis for treatment or other patient management decisions. A negative result may occur with  improper specimen collection/handling, submission of specimen other than nasopharyngeal swab, presence of viral mutation(s) within the areas targeted by this assay, and inadequate number of viral copies(<138 copies/mL). A negative result must be combined with clinical observations, patient history, and epidemiological information. The expected result is Negative.  Fact Sheet for Patients:  EntrepreneurPulse.com.au  Fact Sheet for Healthcare Providers:  IncredibleEmployment.be  This test is no t yet approved or cleared by the [place] FDA and  has been authorized for detection and/or diagnosis of SARS-CoV-2 by FDA under an Emergency Use Authorization (EUA). This EUA will remain  in effect  (meaning this test can be used) for the duration of the COVID-19 declaration under Section 564(b)(1) of the Act, 21 U.S.C.section 360bbb-3(b)(1), unless the authorization is terminated  or revoked sooner.       Influenza A by PCR NEGATIVE NEGATIVE Final   Influenza B by PCR NEGATIVE NEGATIVE Final    Comment: (NOTE) The Xpert Xpress SARS-CoV-2/FLU/RSV plus assay is intended as an aid in the diagnosis of influenza from Nasopharyngeal swab specimens and should not be used as a sole basis for treatment. Nasal washings and aspirates are unacceptable for Xpert Xpress SARS-CoV-2/FLU/RSV testing.  Fact Sheet for Patients: EntrepreneurPulse.com.au  Fact Sheet for Healthcare Providers: IncredibleEmployment.be  This test is not yet approved or cleared by the [place] FDA and has been authorized for detection and/or diagnosis of SARS-CoV-2 by FDA under an Emergency Use Authorization (EUA). This EUA will remain in effect (meaning this test can be used) for the duration of the COVID-19 declaration under Section 564(b)(1) of the Act, 21 U.S.C. section 360bbb-3(b)(1), unless the authorization is terminated or revoked.  Performed at [facility], [address] [place], [place] [address]   MRSA Next Gen by PCR, Nasal     Status: None   Collection Time: 04/21/21  8:45 PM   Specimen: Nasal Mucosa; Nasal Swab  Result Value Ref Range Status   MRSA by PCR Next Gen NOT DETECTED NOT DETECTED Final    Comment: (NOTE) The GeneXpert MRSA Assay (FDA approved for NASAL specimens only), is one component of a comprehensive MRSA colonization surveillance program. It is not intended to diagnose MRSA infection nor to guide or monitor treatment for MRSA infections. Test performance is not FDA approved in patients less than 46 years old. Performed at [facility], [address], [place], [place] [address]          Radiology Studies: CT  HEAD WO CONTRAST (5MM)  Result Date: 04/21/2021 CLINICAL DATA:  Altered mental status. EXAM: CT HEAD WITHOUT CONTRAST TECHNIQUE: Contiguous axial images were obtained from the base of the skull through the vertex without intravenous contrast. COMPARISON:  March 23, 2021 FINDINGS: Brain: There is mild cerebral atrophy with widening of the extra-axial spaces and ventricular dilatation. There are areas of decreased attenuation within the white matter tracts of the supratentorial brain, consistent with microvascular disease changes. Vascular: No hyperdense vessel or unexpected calcification. Skull: Normal. Negative for fracture or focal lesion. Sinuses/Orbits: No acute finding. Other: None. IMPRESSION: 1. Generalized cerebral atrophy. 2. No acute intracranial abnormality. Electronically Signed   By: Virgina Norfolk  M.D.   On: 04/21/2021 00:43   DG Chest Portable 1 View  Result Date: 04/20/2021 CLINICAL DATA:  Shortness of breath and hypotension. EXAM: PORTABLE CHEST 1 VIEW COMPARISON:  March 30, 2021 FINDINGS: There is no evidence of acute infiltrate, pleural effusion or pneumothorax. Mild, stable prominence of the pulmonary vasculature is seen. The cardiac silhouette is mildly enlarged and unchanged in size. Degenerative changes seen throughout the thoracic spine. IMPRESSION: Stable cardiomegaly and pulmonary vascular congestion. Electronically Signed   By: Virgina Norfolk M.D.   On: 04/20/2021 22:42   ECHOCARDIOGRAM COMPLETE  Result Date: 04/21/2021    ECHOCARDIOGRAM REPORT   Patient Name:   Christopher [name] Date of Exam: 04/21/2021 Medical Rec #:  [id number]      Height:       71.0 in Accession #:    [id number]     Weight:       370.4 lb Date of Birth:  [birth year]      BSA:          2.740 m Patient Age:    15 years       BP:           94/57 mmHg Patient Gender: M              HR:           50 bpm. Exam Location:  [facility] Procedure: 2D Echo, Cardiac Doppler and Color Doppler Indications:     CHF- acute  diastolic [id number]  History:         Patient has no prior history of Echocardiogram examinations.                  CHF; Risk Factors:Sleep Apnea.  Sonographer:     Sherrie Sport Referring Phys:  [id number] [place] Diagnosing Phys: Isaias Cowman MD  Sonographer Comments: Technically challenging study due to limited acoustic windows and no apical window. IMPRESSIONS  1. Left ventricular ejection fraction, by estimation, is 60 to 65%. The left ventricle has normal function. The left ventricle has no regional wall motion abnormalities. Left ventricular diastolic parameters are indeterminate.  2. Right ventricular systolic function is normal. The right ventricular size is normal.  3. The mitral valve is normal in structure. Mild mitral valve regurgitation. No evidence of mitral stenosis.  4. The aortic valve is normal in structure. Aortic valve regurgitation is not visualized. No aortic stenosis is present.  5. The inferior vena cava is normal in size with greater than 50% respiratory variability, suggesting right atrial pressure of 3 mmHg. FINDINGS  Left Ventricle: Left ventricular ejection fraction, by estimation, is 60 to 65%. The left ventricle has normal function. The left ventricle has no regional wall motion abnormalities. The left ventricular internal cavity size was normal in size. There is  no left ventricular hypertrophy. Left ventricular diastolic parameters are indeterminate. Right Ventricle: The right ventricular size is normal. No increase in right ventricular wall thickness. Right ventricular systolic function is normal. Left Atrium: Left atrial size was normal in size. Right Atrium: Right atrial size was normal in size. Pericardium: There is no evidence of pericardial effusion. Mitral Valve: The mitral valve is normal in structure. Mild mitral valve regurgitation. No evidence of mitral valve stenosis. Tricuspid Valve: The tricuspid valve is normal in structure. Tricuspid valve regurgitation is mild .  No evidence of tricuspid stenosis. Aortic Valve: The aortic valve is normal in structure. Aortic valve regurgitation is not visualized. No aortic stenosis is present. Pulmonic Valve:  The pulmonic valve was normal in structure. Pulmonic valve regurgitation is not visualized. No evidence of pulmonic stenosis. Aorta: The aortic root is normal in size and structure. Venous: The inferior vena cava is normal in size with greater than 50% respiratory variability, suggesting right atrial pressure of 3 mmHg. IAS/Shunts: No atrial level shunt detected by color flow Doppler.  LEFT VENTRICLE PLAX 2D LVIDd:         5.07 cm LVIDs:         3.44 cm LV PW:         1.78 cm LV IVS:        0.99 cm LVOT diam:     2.00 cm LVOT Area:     3.14 cm  LEFT ATRIUM         Index LA diam:    4.00 cm 1.46 cm/m                        PULMONIC VALVE AORTA                 PV Vmax:        0.62 m/s Ao Root diam: 3.60 cm PV Peak grad:   1.5 mmHg                       RVOT Peak grad: 3 mmHg   SHUNTS Systemic Diam: 2.00 cm Isaias Cowman MD Electronically signed by Isaias Cowman MD Signature Date/Time: 04/21/2021/1:22:05 PM    Final         Scheduled Meds:  aspirin EC  81 mg Oral QHS   atorvastatin  20 mg Oral QHS   buPROPion  150 mg Oral BID   chlorhexidine  15 mL Mouth Rinse BID   Chlorhexidine Gluconate Cloth  6 each Topical Daily   cholecalciferol  1,000 Units Oral QHS   enoxaparin (LOVENOX) injection  0.5 mg/kg Subcutaneous Q24H   gabapentin  100 mg Oral TID   levothyroxine  88 mcg Oral Daily   mouth rinse  15 mL Mouth Rinse q12n4p   methylPREDNISolone (SOLU-MEDROL) injection  40 mg Intravenous Q24H   midodrine  5 mg Oral TID WC   multivitamin-lutein  1 capsule Oral BID   patiromer  8.4 g Oral Daily   Ensure Max Protein  11 oz Oral BID   cyanocobalamin  1,000 mcg Oral Once per day on Sun Wed   Continuous Infusions:  furosemide (LASIX) 200 mg in dextrose 5% 100 mL ('2mg'$ /mL) infusion 4 mg/hr (04/22/21 0700)      LOS: 1 day    Time spent: 35 min with more than 50% on COC    Nolberto Hanlon, MD [facility]   To contact the attending provider between 7A-7P or the covering provider during after hours 7P-7A, please log into the web site [email or web address] and access using universal [place] password for that web site. If you do not have the password, please call the hospital operator.  04/22/2021, 3:03 PM

## 2021-04-23 DIAGNOSIS — N178 Other acute kidney failure: Secondary | ICD-10-CM | POA: Diagnosis not present

## 2021-04-23 DIAGNOSIS — J9621 Acute and chronic respiratory failure with hypoxia: Secondary | ICD-10-CM | POA: Diagnosis not present

## 2021-04-23 DIAGNOSIS — I10 Essential (primary) hypertension: Secondary | ICD-10-CM | POA: Diagnosis not present

## 2021-04-23 DIAGNOSIS — I5033 Acute on chronic diastolic (congestive) heart failure: Secondary | ICD-10-CM | POA: Diagnosis not present

## 2021-04-23 LAB — COMPREHENSIVE METABOLIC PANEL
ALT: 7 U/L (ref 0–44)
AST: 10 U/L — ABNORMAL LOW (ref 15–41)
Albumin: 3.8 g/dL (ref 3.5–5.0)
Alkaline Phosphatase: 32 U/L — ABNORMAL LOW (ref 38–126)
Anion gap: 10 (ref 5–15)
BUN: 42 mg/dL — ABNORMAL HIGH (ref 8–23)
CO2: 38 mmol/L — ABNORMAL HIGH (ref 22–32)
Calcium: 8.9 mg/dL (ref 8.9–10.3)
Chloride: 92 mmol/L — ABNORMAL LOW (ref 98–111)
Creatinine, Ser: 1.83 mg/dL — ABNORMAL HIGH (ref 0.61–1.24)
GFR, Estimated: 39 mL/min — ABNORMAL LOW (ref >60)
Glucose, Bld: 123 mg/dL — ABNORMAL HIGH (ref 70–99)
Potassium: 4.3 mmol/L (ref 3.5–5.1)
Sodium: 140 mmol/L (ref 135–145)
Total Bilirubin: 0.9 mg/dL (ref 0.3–1.2)
Total Protein: 6.3 g/dL — ABNORMAL LOW (ref 6.5–8.1)

## 2021-04-23 MED ORDER — TORSEMIDE 20 MG PO TABS
40.0000 mg | ORAL_TABLET | Freq: Every day | ORAL | Status: DC
  Administered 2021-04-23 – 2021-04-29 (×7): 40 mg via ORAL
  Filled 2021-04-23 (×7): qty 2

## 2021-04-23 NOTE — Progress Notes (Signed)
Patient has no daily lab orders. Lasix gtt infusing. Hospitalist made aware.

## 2021-04-23 NOTE — Plan of Care (Signed)

## 2021-04-23 NOTE — Progress Notes (Signed)
[place], [place] 04/23/21  Subjective:   Hospital day # 2   Overall feeling better.  States he was able to have some breakfast. Serum creatinine has improved further  08/31 0701 - 09/01 0700 In: 584.8 [P.O.:480; I.V.:44.8] Out: 2400 [Urine:2400] Lab Results  Component Value Date   CREATININE 1.83 (H) 04/23/2021   CREATININE 1.93 (H) 04/22/2021   CREATININE 2.13 (H) 04/21/2021     Objective:  Vital signs in last 24 hours:  Temp:  [97.6 F (36.4 C)-98.5 F (36.9 C)] 98.3 F (36.8 C) (09/01 0900) Pulse Rate:  [48-72] 54 (09/01 1100) Resp:  [13-29] 16 (09/01 1100) BP: (83-124)/(42-73) 122/66 (09/01 1100) SpO2:  [88 %-100 %] 97 % (09/01 1100) Weight:  [162.4 kg] 162.4 kg (09/01 0409)  Weight change: -5.6 kg Filed Weights   04/21/21 2040 04/22/21 0500 04/23/21 0409  Weight: (!) 162.7 kg (!) 162.7 kg (!) 162.4 kg    Intake/Output:    Intake/Output Summary (Last 24 hours) at 04/23/2021 1203 Last data filed at 04/23/2021 1100 Gross per 24 hour  Intake 584.77 ml  Output 2800 ml  Net -2215.23 ml      Physical Exam: General: Morbidly obese gentleman, laying in the bed  HEENT Anicteric, moist oral mucous membranes  Pulm/lungs Lib[place] O2, mild basilar crackles  CVS/Heart Regular, no rub  Abdomen:  Soft, distended, obese  Extremities: 2+ edema over lower shins and ankles, chronic  Neurologic: Alert, oriented  Skin: Discoloration from chronic leg edema   Foley catheter in place       Basic Metabolic Panel:  Recent Labs  Lab 04/20/21 2222 04/21/21 0539 04/22/21 0624 04/23/21 0643  NA 140 141 144 140  K 5.9* 5.2* 4.9 4.3  CL 98 97* 94* 92*  CO2 38* 36* 39* 38*  GLUCOSE 106* 91 98 123*  BUN 34* 36* 43* 42*  CREATININE 2.04* 2.13* 1.93* 1.83*  CALCIUM 8.8* 8.7* 9.0 8.9  MG  --   --  2.2  --       CBC: Recent Labs  Lab 04/20/21 2222 04/21/21 0539 04/22/21 0624  WBC 5.5 5.1 3.5*  NEUTROABS 2.2  --  2.3  HGB 14.1 13.9 12.1*   HCT 48.7 47.2 41.6  MCV 96.2 96.7 93.9  PLT 223 178 187      No results found for: HEPBSAG, HEPBSAB, HEPBIGM    Microbiology:  Recent Results (from the past 240 hour(s))  Resp Panel by RT-PCR (Flu A&B, Covid) Nasopharyngeal Swab     Status: None   Collection Time: 04/20/21 10:47 PM   Specimen: Nasopharyngeal Swab; Nasopharyngeal(NP) swabs in vial transport medium  Result Value Ref Range Status   SARS Coronavirus 2 by RT PCR NEGATIVE NEGATIVE Final    Comment: (NOTE) SARS-CoV-2 target nucleic acids are NOT DETECTED.  The SARS-CoV-2 RNA is generally detectable in upper respiratory specimens during the acute phase of infection. The lowest concentration of SARS-CoV-2 viral copies this assay can detect is 138 copies/mL. A negative result does not preclude SARS-Cov-2 infection and should not be used as the sole basis for treatment or other patient management decisions. A negative result may occur with  improper specimen collection/handling, submission of specimen other than nasopharyngeal swab, presence of viral mutation(s) within the areas targeted by this assay, and inadequate number of viral copies(<138 copies/mL). A negative result must be combined with clinical observations, patient history, and epidemiological information. The expected result is Negative.  Fact Sheet for Patients:  EntrepreneurPulse.com.au  Fact  Sheet for Healthcare Providers:  IncredibleEmployment.be  This test is no t yet approved or cleared by the [place] FDA and  has been authorized for detection and/or diagnosis of SARS-CoV-2 by FDA under an Emergency Use Authorization (EUA). This EUA will remain  in effect (meaning this test can be used) for the duration of the COVID-19 declaration under Section 564(b)(1) of the Act, 21 U.S.C.section 360bbb-3(b)(1), unless the authorization is terminated  or revoked sooner.       Influenza A by PCR NEGATIVE NEGATIVE  Final   Influenza B by PCR NEGATIVE NEGATIVE Final    Comment: (NOTE) The Xpert Xpress SARS-CoV-2/FLU/RSV plus assay is intended as an aid in the diagnosis of influenza from Nasopharyngeal swab specimens and should not be used as a sole basis for treatment. Nasal washings and aspirates are unacceptable for Xpert Xpress SARS-CoV-2/FLU/RSV testing.  Fact Sheet for Patients: EntrepreneurPulse.com.au  Fact Sheet for Healthcare Providers: IncredibleEmployment.be  This test is not yet approved or cleared by the [place] FDA and has been authorized for detection and/or diagnosis of SARS-CoV-2 by FDA under an Emergency Use Authorization (EUA). This EUA will remain in effect (meaning this test can be used) for the duration of the COVID-19 declaration under Section 564(b)(1) of the Act, 21 U.S.C. section 360bbb-3(b)(1), unless the authorization is terminated or revoked.  Performed at [facility], [address] [place], [place] [address]   MRSA Next Gen by PCR, Nasal     Status: None   Collection Time: 04/21/21  8:45 PM   Specimen: Nasal Mucosa; Nasal Swab  Result Value Ref Range Status   MRSA by PCR Next Gen NOT DETECTED NOT DETECTED Final    Comment: (NOTE) The GeneXpert MRSA Assay (FDA approved for NASAL specimens only), is one component of a comprehensive MRSA colonization surveillance program. It is not intended to diagnose MRSA infection nor to guide or monitor treatment for MRSA infections. Test performance is not FDA approved in patients less than 73 years old. Performed at [facility], [address] [place], [place] [address]     Coagulation Studies: No results for input(s): LABPROT, INR in the last 72 hours.  Urinalysis: No results for input(s): COLORURINE, LABSPEC, PHURINE, GLUCOSEU, HGBUR, BILIRUBINUR, KETONESUR, PROTEINUR, UROBILINOGEN, NITRITE, LEUKOCYTESUR in the last 72 hours.  Invalid input(s):  APPERANCEUR    Imaging: No results found.   Medications:      aspirin EC  81 mg Oral QHS   atorvastatin  20 mg Oral QHS   buPROPion  150 mg Oral BID   chlorhexidine  15 mL Mouth Rinse BID   Chlorhexidine Gluconate Cloth  6 each Topical Daily   cholecalciferol  1,000 Units Oral QHS   enoxaparin (LOVENOX) injection  0.5 mg/kg Subcutaneous Q24H   gabapentin  100 mg Oral TID   levothyroxine  88 mcg Oral Daily   mouth rinse  15 mL Mouth Rinse q12n4p   methylPREDNISolone (SOLU-MEDROL) injection  40 mg Intravenous Q24H   midodrine  10 mg Oral TID WC   multivitamin-lutein  1 capsule Oral BID   patiromer  8.4 g Oral Daily   Ensure Max Protein  11 oz Oral BID   cyanocobalamin  1,000 mcg Oral Once per day on Sun Wed   acetaminophen **OR** acetaminophen, iohexol, levalbuterol, magnesium hydroxide, ondansetron **OR** ondansetron (ZOFRAN) IV, traZODone, vitamin A & D  Assessment/ Plan:  69 y.o. male with  medical problems of morbid obesity, diastolic CHF, depression, anxiety, hypothyroidism, sleep apnea, chronic  respiratory failure with 2 L oxygen supplementation via nasal cannula    admitted on 04/20/2021 for Acute respiratory failure (HCC) [J96.00] Acute on chronic respiratory failure with hypoxia and hypercapnia (HCC) [id number], J96.22]  04/21/2021: LVEF 60 to 65%, indeterminate diastolic parameters, right ventricular size and function is normal   #Acute kidney injury on chronic kidney disease stage IIIa Baseline creatinine of 1.49/GFR 50 from 03/30/2021 AKI likely secondary to cardiorenal syndrome from abnormal hemodynamics. -BP has improved -Gabapentin dose decreased because of renal failure  #Acute on chronic respiratory failure, hypercapnic  # Volume overload in the setting of acute on chronic diastolic CHF #Morbid obesity #Obesity hypoventilation syndrome #Obstructive sleep apnea #Hypotension  - discontinue iv lasix. Start torsemide 40 mg daily -continue midodrine to avoid  hypotension     #Hyperkalemia -Patient was getting potassium chloride supplementation as outpatient.  Agree with holding -Agree with Veltassa supplementation for now.     LOS: 2 Christopher [name] 9/1/202212:03 PM  [place], [place]  Note: This note was prepared with Dragon dictation. Any transcription errors are unintentional

## 2021-04-23 NOTE — Progress Notes (Signed)
Patient has been confused this shift. Oriented to self and place only at the beginning of shift. Post bipap administration patient became even more confused, pulling off bipap mask and asking where his family was; easily reoriented. Blood pressures soft. Remains on 4L [place] and bipap for HS. SB 40s-60s. Turned q 2 when patient agreeable.

## 2021-04-23 NOTE — Progress Notes (Signed)
PROGRESS NOTE    Christopher [name]  [id number] DOB: Aug 18, 1951 DOA: 04/20/2021 PCP: Rusty Aus, MD   Chief complaint.  Shortness of breath. Brief Narrative:  Christopher [name] is a 70 y.o. male with medical history significant for diastolic CHF, depression, anxiety, hypothyroidism and sleep apnea, chronic respiratory failure on 2 L of O2 by nasal cannula, who presented to the emergency room with acute onset of altered mental status with associated dyspnea and hypoxia at his rehab facility.  He has a worsening hypoxemia, was placed on BiPAP.  He has a profound elevation of BNP, started on IV Lasix for diastolic congestive heart failure.  Discussed with daughter, patient has had has been deteriorating since January after he had ankle injury.  He has not been able to walk.  He had increased confusion and hallucinations since April.  He has sleep apnea, not compliant with CPAP.  He currently lives in the nursing home. 8/31 was given a dose of midodrine for low BP last night 9/1 pt has no dizziness, lying in bed. No sob.   Assessment & Plan:   Active Problems:   Obstructive sleep apnea (adult) (pediatric)   Morbid obesity with BMI of 45.0-49.9, adult (HCC)   Acute on chronic diastolic CHF (congestive heart failure) (HCC)   Acute on chronic respiratory failure with hypoxia and hypercapnia (HCC)   Benign essential hypertension   Acute respiratory failure (HCC)   Acute renal failure superimposed on stage 3a chronic kidney disease (HCC)  Acute on chronic respite failure with hypoxemia and hypercapnia. Acute on chronic diastolic congestive heart failure. Morbid obesity  Obesity hypoventilation syndrome. Obstructive sleep apnea. Hypotension. Patient had significant volume overload evidenced by worsening short of breath and hypoxemia, elevated BNP, vascular congestion on chest x-ray. Echocardiogram is a pending. ABG has significant CO2 retention, will continue BiPAP.  RT will follow. Patient  developed borderline hypotension after IV Lasix push.  I will change Lasix to IV drip. I will give 50 g albumin for blood pressure. Patient will be followed in the stepdown unit closely, if blood pressure continue to drop or respiratory status is worse, patient can be transferred to ICU. Patient be continued on BiPAP at nighttime if we can get off him today. I will also placed on 40 mg of IV Solu-Medrol in case he has a COPD.  It may also help if patient has a relative adrenal insufficiency. 9/1-bp better on midodrine IV Lasix discontinued by nephrology, started on torsemide 40 mg daily Continue midodrine to avoid hypotension   Acute kidney injury on chronic kidney disease stage IIIa. Hyperkalemia. Reviewed the patient labs, patient has a chronic kidney disease stage IIIa.  Worsening renal function due to diuretics. Albumin is given. 9/1 nephrology following Baseline creatinine 1.49 AKI likely secondary to cardiorenal syndrome from abnormal hemodynamics Gabapentin teen reduced due to renal failure Avoid hypotension Hypokalemia due to potassium chloride supplementation as outpatient Discontinued Continue with [place] for now Monitor level   Vitamin  B12 deficient anemia. Continue B12 supplementation   Hypothyroidism- Continue levothyroxine    Failure to thrive. Delirium. Delirium appears to be secondary to CO2 retention and noncompliant with CPAP  Patient long-term prognosis is poor     DVT prophylaxis: Lovenox Code Status: Full Family Communication: None at bedside Disposition Plan:    Status is: Inpatient  Remains inpatient appropriate because:IV treatments appropriate due to intensity of illness or inability to take PO and Inpatient level of care appropriate due to severity of illness  Dispo: The  patient is from: SNF              Anticipated d/c is to: SNF              Patient currently is not medically stable to d/c.   Difficult to place patient No         I/O last 3 completed shifts: In: 612.7 [P.O.:480; I.V.:72.7; Other:60] Out: 2700 [Urine:2700] No intake/output data recorded.     Consultants:  nephrology  Procedures: none  Antimicrobials: none   Subjective: No dizzinesss. No sob.  Objective: Vitals:   04/23/21 0600 04/23/21 0605 04/23/21 0700 04/23/21 0900  BP: (!) 118/59  (!) 115/56 119/65  Pulse: (!) 52  (!) 51 (!) 56  Resp: 17  (!) 22 16  Temp:    98.3 F (36.8 C)  TempSrc:      SpO2: 98% 98% 97% 96%  Weight:      Height:        Intake/Output Summary (Last 24 hours) at 04/23/2021 0930 Last data filed at 04/23/2021 0600 Gross per 24 hour  Intake 584.77 ml  Output 1900 ml  Net -1315.23 ml   Filed Weights   04/21/21 2040 04/22/21 0500 04/23/21 0409  Weight: (!) 162.7 kg (!) 162.7 kg (!) 162.4 kg    Examination: Nad, calm Cta no w/r/r Regular s1/s2 no gallop Soft benign +bs No edema Awake and alert Grossly intact      Data Reviewed: I have personally reviewed following labs and imaging studies  CBC: Recent Labs  Lab 04/20/21 2222 04/21/21 0539 04/22/21 0624  WBC 5.5 5.1 3.5*  NEUTROABS 2.2  --  2.3  HGB 14.1 13.9 12.1*  HCT 48.7 47.2 41.6  MCV 96.2 96.7 93.9  PLT 223 178 [id number]   Basic Metabolic Panel: Recent Labs  Lab 04/20/21 2222 04/21/21 0539 04/22/21 0624 04/23/21 0643  NA 140 141 144 140  K 5.9* 5.2* 4.9 4.3  CL 98 97* 94* 92*  CO2 38* 36* 39* 38*  GLUCOSE 106* 91 98 123*  BUN 34* 36* 43* 42*  CREATININE 2.04* 2.13* 1.93* 1.83*  CALCIUM 8.8* 8.7* 9.0 8.9  MG  --   --  2.2  --    GFR: Estimated Creatinine Clearance: 58.5 mL/min (A) (by C-G formula based on SCr of 1.83 mg/dL (H)). Liver Function Tests: Recent Labs  Lab 04/20/21 2222 04/23/21 0643  AST 16 10*  ALT 10 7  ALKPHOS 46 32*  BILITOT 0.9 0.9  PROT 6.6 6.3*  ALBUMIN 3.0* 3.8   No results for input(s): LIPASE, AMYLASE in the last 168 hours. No results for input(s): AMMONIA in the last 168  hours. Coagulation Profile: No results for input(s): INR, PROTIME in the last 168 hours. Cardiac Enzymes: No results for input(s): CKTOTAL, CKMB, CKMBINDEX, TROPONINI in the last 168 hours. BNP (last 3 results) No results for input(s): PROBNP in the last 8760 hours. HbA1C: No results for input(s): HGBA1C in the last 72 hours. CBG: Recent Labs  Lab 04/21/21 2035  GLUCAP 128*   Lipid Profile: No results for input(s): CHOL, HDL, LDLCALC, TRIG, CHOLHDL, LDLDIRECT in the last 72 hours. Thyroid Function Tests: Recent Labs    04/22/21 0624  TSH 1.599   Anemia Panel: No results for input(s): VITAMINB12, FOLATE, FERRITIN, TIBC, IRON, RETICCTPCT in the last 72 hours. Sepsis Labs: No results for input(s): PROCALCITON, LATICACIDVEN in the last 168 hours.  Recent Results (from the past 240 hour(s))  Resp Panel by RT-PCR (Flu  A&B, Covid) Nasopharyngeal Swab     Status: None   Collection Time: 04/20/21 10:47 PM   Specimen: Nasopharyngeal Swab; Nasopharyngeal(NP) swabs in vial transport medium  Result Value Ref Range Status   SARS Coronavirus 2 by RT PCR NEGATIVE NEGATIVE Final    Comment: (NOTE) SARS-CoV-2 target nucleic acids are NOT DETECTED.  The SARS-CoV-2 RNA is generally detectable in upper respiratory specimens during the acute phase of infection. The lowest concentration of SARS-CoV-2 viral copies this assay can detect is 138 copies/mL. A negative result does not preclude SARS-Cov-2 infection and should not be used as the sole basis for treatment or other patient management decisions. A negative result may occur with  improper specimen collection/handling, submission of specimen other than nasopharyngeal swab, presence of viral mutation(s) within the areas targeted by this assay, and inadequate number of viral copies(<138 copies/mL). A negative result must be combined with clinical observations, patient history, and epidemiological information. The expected result is  Negative.  Fact Sheet for Patients:  EntrepreneurPulse.com.au  Fact Sheet for Healthcare Providers:  IncredibleEmployment.be  This test is no t yet approved or cleared by the [place] FDA and  has been authorized for detection and/or diagnosis of SARS-CoV-2 by FDA under an Emergency Use Authorization (EUA). This EUA will remain  in effect (meaning this test can be used) for the duration of the COVID-19 declaration under Section 564(b)(1) of the Act, 21 U.S.C.section 360bbb-3(b)(1), unless the authorization is terminated  or revoked sooner.       Influenza A by PCR NEGATIVE NEGATIVE Final   Influenza B by PCR NEGATIVE NEGATIVE Final    Comment: (NOTE) The Xpert Xpress SARS-CoV-2/FLU/RSV plus assay is intended as an aid in the diagnosis of influenza from Nasopharyngeal swab specimens and should not be used as a sole basis for treatment. Nasal washings and aspirates are unacceptable for Xpert Xpress SARS-CoV-2/FLU/RSV testing.  Fact Sheet for Patients: EntrepreneurPulse.com.au  Fact Sheet for Healthcare Providers: IncredibleEmployment.be  This test is not yet approved or cleared by the [place] FDA and has been authorized for detection and/or diagnosis of SARS-CoV-2 by FDA under an Emergency Use Authorization (EUA). This EUA will remain in effect (meaning this test can be used) for the duration of the COVID-19 declaration under Section 564(b)(1) of the Act, 21 U.S.C. section 360bbb-3(b)(1), unless the authorization is terminated or revoked.  Performed at [facility], [address] [place], [place] [address]   MRSA Next Gen by PCR, Nasal     Status: None   Collection Time: 04/21/21  8:45 PM   Specimen: Nasal Mucosa; Nasal Swab  Result Value Ref Range Status   MRSA by PCR Next Gen NOT DETECTED NOT DETECTED Final    Comment: (NOTE) The GeneXpert MRSA Assay (FDA approved for NASAL  specimens only), is one component of a comprehensive MRSA colonization surveillance program. It is not intended to diagnose MRSA infection nor to guide or monitor treatment for MRSA infections. Test performance is not FDA approved in patients less than 52 years old. Performed at [facility], [address], [place], [place] [address]          Radiology Studies: ECHOCARDIOGRAM COMPLETE  Result Date: 04/21/2021    ECHOCARDIOGRAM REPORT   Patient Name:   Christopher [name] Date of Exam: 04/21/2021 Medical Rec #:  [id number]      Height:       71.0 in Accession #:    [id number]     Weight:  370.4 lb Date of Birth:  09-05-1950      BSA:          2.740 m Patient Age:    65 years       BP:           94/57 mmHg Patient Gender: M              HR:           50 bpm. Exam Location:  [facility] Procedure: 2D Echo, Cardiac Doppler and Color Doppler Indications:     CHF- acute diastolic [id number]  History:         Patient has no prior history of Echocardiogram examinations.                  CHF; Risk Factors:Sleep Apnea.  Sonographer:     Sherrie Sport Referring Phys:  [id number] [place] Diagnosing Phys: Isaias Cowman MD  Sonographer Comments: Technically challenging study due to limited acoustic windows and no apical window. IMPRESSIONS  1. Left ventricular ejection fraction, by estimation, is 60 to 65%. The left ventricle has normal function. The left ventricle has no regional wall motion abnormalities. Left ventricular diastolic parameters are indeterminate.  2. Right ventricular systolic function is normal. The right ventricular size is normal.  3. The mitral valve is normal in structure. Mild mitral valve regurgitation. No evidence of mitral stenosis.  4. The aortic valve is normal in structure. Aortic valve regurgitation is not visualized. No aortic stenosis is present.  5. The inferior vena cava is normal in size with greater than 50% respiratory variability, suggesting right atrial pressure of 3 mmHg.  FINDINGS  Left Ventricle: Left ventricular ejection fraction, by estimation, is 60 to 65%. The left ventricle has normal function. The left ventricle has no regional wall motion abnormalities. The left ventricular internal cavity size was normal in size. There is  no left ventricular hypertrophy. Left ventricular diastolic parameters are indeterminate. Right Ventricle: The right ventricular size is normal. No increase in right ventricular wall thickness. Right ventricular systolic function is normal. Left Atrium: Left atrial size was normal in size. Right Atrium: Right atrial size was normal in size. Pericardium: There is no evidence of pericardial effusion. Mitral Valve: The mitral valve is normal in structure. Mild mitral valve regurgitation. No evidence of mitral valve stenosis. Tricuspid Valve: The tricuspid valve is normal in structure. Tricuspid valve regurgitation is mild . No evidence of tricuspid stenosis. Aortic Valve: The aortic valve is normal in structure. Aortic valve regurgitation is not visualized. No aortic stenosis is present. Pulmonic Valve: The pulmonic valve was normal in structure. Pulmonic valve regurgitation is not visualized. No evidence of pulmonic stenosis. Aorta: The aortic root is normal in size and structure. Venous: The inferior vena cava is normal in size with greater than 50% respiratory variability, suggesting right atrial pressure of 3 mmHg. IAS/Shunts: No atrial level shunt detected by color flow Doppler.  LEFT VENTRICLE PLAX 2D LVIDd:         5.07 cm LVIDs:         3.44 cm LV PW:         1.78 cm LV IVS:        0.99 cm LVOT diam:     2.00 cm LVOT Area:     3.14 cm  LEFT ATRIUM         Index LA diam:    4.00 cm 1.46 cm/m  PULMONIC VALVE AORTA                 PV Vmax:        0.62 m/s Ao Root diam: 3.60 cm PV Peak grad:   1.5 mmHg                       RVOT Peak grad: 3 mmHg   SHUNTS Systemic Diam: 2.00 cm Isaias Cowman MD Electronically signed by  Isaias Cowman MD Signature Date/Time: 04/21/2021/1:22:05 PM    Final         Scheduled Meds:  aspirin EC  81 mg Oral QHS   atorvastatin  20 mg Oral QHS   buPROPion  150 mg Oral BID   chlorhexidine  15 mL Mouth Rinse BID   Chlorhexidine Gluconate Cloth  6 each Topical Daily   cholecalciferol  1,000 Units Oral QHS   enoxaparin (LOVENOX) injection  0.5 mg/kg Subcutaneous Q24H   gabapentin  100 mg Oral TID   levothyroxine  88 mcg Oral Daily   mouth rinse  15 mL Mouth Rinse q12n4p   methylPREDNISolone (SOLU-MEDROL) injection  40 mg Intravenous Q24H   midodrine  10 mg Oral TID WC   multivitamin-lutein  1 capsule Oral BID   patiromer  8.4 g Oral Daily   Ensure Max Protein  11 oz Oral BID   cyanocobalamin  1,000 mcg Oral Once per day on Sun Wed   Continuous Infusions:  furosemide (LASIX) 200 mg in dextrose 5% 100 mL ('2mg'$ /mL) infusion 4 mg/hr (04/23/21 0600)     LOS: 2 days    Time spent: 35 min with more than 50% on [place], MD [facility]   To contact the attending provider between 7A-7P or the covering provider during after hours 7P-7A, please log into the web site [email or web address] and access using universal [place] password for that web site. If you do not have the password, please call the hospital operator.  04/23/2021, 9:30 AM

## 2021-04-24 DIAGNOSIS — N178 Other acute kidney failure: Secondary | ICD-10-CM | POA: Diagnosis not present

## 2021-04-24 DIAGNOSIS — I5033 Acute on chronic diastolic (congestive) heart failure: Secondary | ICD-10-CM | POA: Diagnosis not present

## 2021-04-24 DIAGNOSIS — J9621 Acute and chronic respiratory failure with hypoxia: Secondary | ICD-10-CM | POA: Diagnosis not present

## 2021-04-24 LAB — BASIC METABOLIC PANEL
Anion gap: 7 (ref 5–15)
BUN: 43 mg/dL — ABNORMAL HIGH (ref 8–23)
CO2: 44 mmol/L — ABNORMAL HIGH (ref 22–32)
Calcium: 8.9 mg/dL (ref 8.9–10.3)
Chloride: 91 mmol/L — ABNORMAL LOW (ref 98–111)
Creatinine, Ser: 1.67 mg/dL — ABNORMAL HIGH (ref 0.61–1.24)
GFR, Estimated: 44 mL/min — ABNORMAL LOW (ref >60)
Glucose, Bld: 106 mg/dL — ABNORMAL HIGH (ref 70–99)
Potassium: 4.3 mmol/L (ref 3.5–5.1)
Sodium: 142 mmol/L (ref 135–145)

## 2021-04-24 NOTE — [place] Notes (Signed)
[facility] Cardiology  SUBJECTIVE: Patient laying flat in bed, watching TV, denies chest pain or shortness of breath   Vitals:   04/24/21 0300 04/24/21 0400 04/24/21 0500 04/24/21 0600  BP: (!) 102/58 110/64 98/64 109/62  Pulse: (!) 51 (!) 52 (!) 48 (!) 56  Resp: '16 19 18 17  '$ Temp:      TempSrc:      SpO2: 94% 91% 91% 91%  Weight:      Height:         Intake/Output Summary (Last 24 hours) at 04/24/2021 [id number] Last data filed at 04/23/2021 2300 Gross per 24 hour  Intake 7.44 ml  Output 2400 ml  Net -2392.56 ml      PHYSICAL EXAM  General: Well developed, well nourished, in no acute distress HEENT:  Normocephalic and atramatic Neck:  No JVD.  Lungs: Clear bilaterally to auscultation and percussion. Heart: HRRR . Normal S1 and S2 without gallops or murmurs.  Abdomen: Bowel sounds are positive, abdomen soft and non-tender  Msk:  Back normal, normal gait. Normal strength and tone for age. Extremities: No clubbing, cyanosis or edema.   Neuro: Alert and oriented X 3. Psych:  Good affect, responds appropriately   LABS: Basic Metabolic Panel: Recent Labs    04/22/21 0624 04/23/21 0643 04/24/21 0558  NA 144 140 142  K 4.9 4.3 4.3  CL 94* 92* 91*  CO2 39* 38* 44*  GLUCOSE 98 123* 106*  BUN 43* 42* 43*  CREATININE 1.93* 1.83* 1.67*  CALCIUM 9.0 8.9 8.9  MG 2.2  --   --    Liver Function Tests: Recent Labs    04/23/21 0643  AST 10*  ALT 7  ALKPHOS 32*  BILITOT 0.9  PROT 6.3*  ALBUMIN 3.8   No results for input(s): LIPASE, AMYLASE in the last 72 hours. CBC: Recent Labs    04/22/21 0624  WBC 3.5*  NEUTROABS 2.3  HGB 12.1*  HCT 41.6  MCV 93.9  PLT 187   Cardiac Enzymes: No results for input(s): CKTOTAL, CKMB, CKMBINDEX, TROPONINI in the last 72 hours. BNP: Invalid input(s): POCBNP D-Dimer: No results for input(s): DDIMER in the last 72 hours. Hemoglobin A1C: No results for input(s): HGBA1C in the last 72 hours. Fasting Lipid Panel: No results for input(s):  CHOL, HDL, LDLCALC, TRIG, CHOLHDL, LDLDIRECT in the last 72 hours. Thyroid Function Tests: Recent Labs    04/22/21 0624  TSH 1.599   Anemia Panel: No results for input(s): VITAMINB12, FOLATE, FERRITIN, TIBC, IRON, RETICCTPCT in the last 72 hours.  No results found.   Echo with EF 6065% by 2D echocardiogram 04/21/2021  TELEMETRY: Sinus bradycardia:  ASSESSMENT AND PLAN:  Active Problems:   Obstructive sleep apnea (adult) (pediatric)   Morbid obesity with BMI of 45.0-49.9, adult (HCC)   Acute on chronic diastolic CHF (congestive heart failure) (HCC)   Acute on chronic respiratory failure with hypoxia and hypercapnia (HCC)   Benign essential hypertension   Acute respiratory failure (HCC)   Acute renal failure superimposed on stage 3a chronic kidney disease ([place])    1.  Acute on chronic diastolic congestive heart failure, in patient with obesity, obstructive sleep apnea, and chronic respiratory failure on 2 L of oxygen, presents with altered mental status likely due to hypoxia, much improved, now on p.o. torsemide 2.  Chronic respiratory failure on 2 L by [place] at home 3.  Chronic kidney disease, BUN and creatinine 43 and 1.93, respectively, evaluated by nephrology, started on furosemide drip 4.  Obstructive sleep apnea, currently on BiPAP 5.  Abnormal ECG, in the absence of chest pain, with borderline elevated high-sensitivity troponin (25, 23) without delta 6.  Sinus bradycardia, of uncertain clinical significance, propanolol currently held   Recommendations   1.  Agree with current therapy 2.  Continue torsemide 3.  Carefully monitor renal status 4.  Hold propranolol for now 5.  Defer full dose anticoagulation 6.  No further cardiac diagnostics at this time 7.  Follow-up with me 1 to 2 weeks  Sign off for now, please call if any questions   Isaias Cowman, MD, PhD, [facility] 04/24/2021 8:29 AM

## 2021-04-24 NOTE — Progress Notes (Signed)
[place], [place] 04/24/21  Subjective:   Hospital day # 3  Denies any acute complaints.  Serum creatinine continues to improve Fair urine output  09/01 0701 - 09/02 0700 In: 7.4 [I.V.:7.4] Out: 2400 [Urine:2400] Lab Results  Component Value Date   CREATININE 1.67 (H) 04/24/2021   CREATININE 1.83 (H) 04/23/2021   CREATININE 1.93 (H) 04/22/2021     Objective:  Vital signs in last 24 hours:  Temp:  [97.5 F (36.4 C)-98.1 F (36.7 C)] 97.7 F (36.5 C) (09/02 0800) Pulse Rate:  [48-69] 50 (09/02 1000) Resp:  [14-24] 19 (09/02 1000) BP: (77-134)/(52-73) 122/59 (09/02 1000) SpO2:  [90 %-98 %] 93 % (09/02 1000) FiO2 (%):  [30 %-45 %] 45 % (09/02 0421)  Weight change:  Filed Weights   04/21/21 2040 04/22/21 0500 04/23/21 0409  Weight: (!) 162.7 kg (!) 162.7 kg (!) 162.4 kg    Intake/Output:    Intake/Output Summary (Last 24 hours) at 04/24/2021 1059 Last data filed at 04/23/2021 2300 Gross per 24 hour  Intake 7.44 ml  Output 2400 ml  Net -2392.56 ml      Physical Exam: General: Morbidly obese gentleman, laying in the bed  HEENT Anicteric, moist oral mucous membranes  Pulm/lungs [place] O2, mild basilar crackles  CVS/Heart Regular, no rub  Abdomen:  Soft, distended, obese  Extremities: 1+ edema over lower shins and ankles, chronic, improved  Neurologic: Alert, oriented  Skin: Discoloration from chronic leg edema   Foley catheter in place       Basic Metabolic Panel:  Recent Labs  Lab 04/20/21 2222 04/21/21 0539 04/22/21 0624 04/23/21 0643 04/24/21 0558  NA 140 141 144 140 142  K 5.9* 5.2* 4.9 4.3 4.3  CL 98 97* 94* 92* 91*  CO2 38* 36* 39* 38* 44*  GLUCOSE 106* 91 98 123* 106*  BUN 34* 36* 43* 42* 43*  CREATININE 2.04* 2.13* 1.93* 1.83* 1.67*  CALCIUM 8.8* 8.7* 9.0 8.9 8.9  MG  --   --  2.2  --   --       CBC: Recent Labs  Lab 04/20/21 2222 04/21/21 0539 04/22/21 0624  WBC 5.5 5.1 3.5*  NEUTROABS 2.2  --  2.3   HGB 14.1 13.9 12.1*  HCT 48.7 47.2 41.6  MCV 96.2 96.7 93.9  PLT 223 178 187      No results found for: HEPBSAG, HEPBSAB, HEPBIGM    Microbiology:  Recent Results (from the past 240 hour(s))  Resp Panel by RT-PCR (Flu A&B, Covid) Nasopharyngeal Swab     Status: None   Collection Time: 04/20/21 10:47 PM   Specimen: Nasopharyngeal Swab; Nasopharyngeal(NP) swabs in vial transport medium  Result Value Ref Range Status   SARS Coronavirus 2 by RT PCR NEGATIVE NEGATIVE Final    Comment: (NOTE) SARS-CoV-2 target nucleic acids are NOT DETECTED.  The SARS-CoV-2 RNA is generally detectable in upper respiratory specimens during the acute phase of infection. The lowest concentration of SARS-CoV-2 viral copies this assay can detect is 138 copies/mL. A negative result does not preclude SARS-Cov-2 infection and should not be used as the sole basis for treatment or other patient management decisions. A negative result may occur with  improper specimen collection/handling, submission of specimen other than nasopharyngeal swab, presence of viral mutation(s) within the areas targeted by this assay, and inadequate number of viral copies(<138 copies/mL). A negative result must be combined with clinical observations, patient history, and epidemiological information. The expected result is Negative.  Fact Sheet for Patients:  EntrepreneurPulse.com.au  Fact Sheet for Healthcare Providers:  IncredibleEmployment.be  This test is no t yet approved or cleared by the [place] FDA and  has been authorized for detection and/or diagnosis of SARS-CoV-2 by FDA under an Emergency Use Authorization (EUA). This EUA will remain  in effect (meaning this test can be used) for the duration of the COVID-19 declaration under Section 564(b)(1) of the Act, 21 U.S.C.section 360bbb-3(b)(1), unless the authorization is terminated  or revoked sooner.       Influenza A by  PCR NEGATIVE NEGATIVE Final   Influenza B by PCR NEGATIVE NEGATIVE Final    Comment: (NOTE) The Xpert Xpress SARS-CoV-2/FLU/RSV plus assay is intended as an aid in the diagnosis of influenza from Nasopharyngeal swab specimens and should not be used as a sole basis for treatment. Nasal washings and aspirates are unacceptable for Xpert Xpress SARS-CoV-2/FLU/RSV testing.  Fact Sheet for Patients: EntrepreneurPulse.com.au  Fact Sheet for Healthcare Providers: IncredibleEmployment.be  This test is not yet approved or cleared by the [place] FDA and has been authorized for detection and/or diagnosis of SARS-CoV-2 by FDA under an Emergency Use Authorization (EUA). This EUA will remain in effect (meaning this test can be used) for the duration of the COVID-19 declaration under Section 564(b)(1) of the Act, 21 U.S.C. section 360bbb-3(b)(1), unless the authorization is terminated or revoked.  Performed at [facility], [address] [place], [place] [address]   MRSA Next Gen by PCR, Nasal     Status: None   Collection Time: 04/21/21  8:45 PM   Specimen: Nasal Mucosa; Nasal Swab  Result Value Ref Range Status   MRSA by PCR Next Gen NOT DETECTED NOT DETECTED Final    Comment: (NOTE) The GeneXpert MRSA Assay (FDA approved for NASAL specimens only), is one component of a comprehensive MRSA colonization surveillance program. It is not intended to diagnose MRSA infection nor to guide or monitor treatment for MRSA infections. Test performance is not FDA approved in patients less than 33 years old. Performed at [facility], [address] [place], [place] [address]     Coagulation Studies: No results for input(s): LABPROT, INR in the last 72 hours.  Urinalysis: No results for input(s): COLORURINE, LABSPEC, PHURINE, GLUCOSEU, HGBUR, BILIRUBINUR, KETONESUR, PROTEINUR, UROBILINOGEN, NITRITE, LEUKOCYTESUR in the last 72  hours.  Invalid input(s): APPERANCEUR    Imaging: No results found.   Medications:      aspirin EC  81 mg Oral QHS   atorvastatin  20 mg Oral QHS   buPROPion  150 mg Oral BID   chlorhexidine  15 mL Mouth Rinse BID   Chlorhexidine Gluconate Cloth  6 each Topical Daily   cholecalciferol  1,000 Units Oral QHS   enoxaparin (LOVENOX) injection  0.5 mg/kg Subcutaneous Q24H   gabapentin  100 mg Oral TID   levothyroxine  88 mcg Oral Daily   mouth rinse  15 mL Mouth Rinse q12n4p   methylPREDNISolone (SOLU-MEDROL) injection  40 mg Intravenous Q24H   midodrine  10 mg Oral TID WC   multivitamin-lutein  1 capsule Oral BID   patiromer  8.4 g Oral Daily   Ensure Max Protein  11 oz Oral BID   torsemide  40 mg Oral Daily   cyanocobalamin  1,000 mcg Oral Once per day on Sun Wed   acetaminophen **OR** acetaminophen, iohexol, levalbuterol, magnesium hydroxide, ondansetron **OR** ondansetron (ZOFRAN) IV, traZODone, vitamin A & D  Assessment/ Plan:  70 y.o.  male with  medical problems of morbid obesity, diastolic CHF, depression, anxiety, hypothyroidism, sleep apnea, chronic respiratory failure with 2 L oxygen supplementation via nasal cannula    admitted on 04/20/2021 for Acute respiratory failure (HCC) [J96.00] Acute on chronic respiratory failure with hypoxia and hypercapnia (HCC) [id number], J96.22]  04/21/2021: LVEF 60 to 65%, indeterminate diastolic parameters, right ventricular size and function is normal   #Acute kidney injury on chronic kidney disease stage IIIa Baseline creatinine of 1.49/GFR 50 from 03/30/2021 AKI likely secondary to cardiorenal syndrome from abnormal hemodynamics. -Serum creatinine continues to improve with current management -Gabapentin dose decreased because of renal failure  #Acute on chronic respiratory failure, hypercapnic  # Volume overload in the setting of acute on chronic diastolic CHF #Morbid obesity #Obesity hypoventilation syndrome #Obstructive sleep  apnea #Hypotension -Diuresed well with IV furosemide.  Now on maintenance dose of torsemide 40 mg daily -continue midodrine to avoid hypotension     #Hyperkalemia -Patient was getting potassium chloride supplementation as outpatient.  Agree with holding -Agree with Veltassa supplementation for now. -Will likely not needed at discharge  We will sign off.  Please reconsult as necessary    LOS: [place] 9/2/202210:59 AM  [place], [place]  Note: This note was prepared with Dragon dictation. Any transcription errors are unintentional

## 2021-04-24 NOTE — Progress Notes (Signed)
PROGRESS NOTE    Christopher [name]  [id number] DOB: Dec 29, 1950 DOA: 04/20/2021 PCP: Rusty Aus, MD   Chief complaint.  Shortness of breath. Brief Narrative:  Christopher [name] is a 70 y.o. male with medical history significant for diastolic CHF, depression, anxiety, hypothyroidism and sleep apnea, chronic respiratory failure on 2 L of O2 by nasal cannula, who presented to the emergency room with acute onset of altered mental status with associated dyspnea and hypoxia at his rehab facility.  He has a worsening hypoxemia, was placed on BiPAP.  He has a profound elevation of BNP, started on IV Lasix for diastolic congestive heart failure.  Discussed with daughter, patient has had has been deteriorating since January after he had ankle injury.  He has not been able to walk.  He had increased confusion and hallucinations since April.  He has sleep apnea, not compliant with CPAP.  He currently lives in the nursing home. 8/31 was given a dose of midodrine for low BP last night 9/1 pt has no dizziness, lying in bed. No sob.  9/2 no sob, no cp  Assessment & Plan:   Active Problems:   Obstructive sleep apnea (adult) (pediatric)   Morbid obesity with BMI of 45.0-49.9, adult (HCC)   Acute on chronic diastolic CHF (congestive heart failure) (HCC)   Acute on chronic respiratory failure with hypoxia and hypercapnia (HCC)   Benign essential hypertension   Acute respiratory failure (HCC)   Acute renal failure superimposed on stage 3a chronic kidney disease (HCC)  Acute on chronic respite failure with hypoxemia and hypercapnia. Acute on chronic diastolic congestive heart failure. Morbid obesity  Obesity hypoventilation syndrome. Obstructive sleep apnea. Hypotension. Patient had significant volume overload evidenced by worsening short of breath and hypoxemia, elevated BNP, vascular congestion on chest x-ray. Echocardiogram is a pending. ABG has significant CO2 retention, will continue BiPAP.  RT  will follow. Patient developed borderline hypotension after IV Lasix push.  I will change Lasix to IV drip. I will give 50 g albumin for blood pressure. Patient will be followed in the stepdown unit closely, if blood pressure continue to drop or respiratory status is worse, patient can be transferred to ICU. Patient be continued on BiPAP at nighttime if we can get off him today. I will also placed on 40 mg of IV Solu-Medrol in case he has a COPD.  It may also help if patient has a relative adrenal insufficiency. 9/2- bp better with midodrine IV Lasix was discontinued by nephrology and started on torsemide 40 mg daily now Avoid hypotension     Acute kidney injury on chronic kidney disease stage IIIa. Hyperkalemia. Reviewed the patient labs, patient has a chronic kidney disease stage IIIa.  Worsening renal function due to diuretics. Albumin is given. Baseline creatinine 1.49 AKI likely secondary to cardiorenal syndrome from abnormal hemodynamics Gabapentin teen reduced due to renal failure Avoid hypotension Hypokalemia due to potassium chloride supplementation as outpatient Discontinued 9/2 continue monitoring and continue Veltassa supplementation for now Will likely not needed at discharge   Vitamin  B12 deficient anemia. Continue B12 supplementation   Hypothyroidism- Continue levothyroxine    Failure to thrive. Delirium. Delirium appears to be secondary to CO2 retention and noncompliant with CPAP  Patient long-term prognosis is poor     DVT prophylaxis: Lovenox Code Status: Full Family Communication: None at bedside Disposition Plan:    Status is: Inpatient  Remains inpatient appropriate because:IV treatments appropriate due to intensity of illness or inability to take PO and  Inpatient level of care appropriate due to severity of illness  Dispo: The patient is from: SNF              Anticipated d/c is to: SNF              Patient currently is not medically  stable to d/c.   Difficult to place patient No        I/O last 3 completed shifts: In: 569.2 [P.O.:480; I.V.:29.2; Other:60] Out: 3225 [Urine:3225] Total I/O In: -  Out: 1000 [Urine:1000]     Consultants:  nephrology  Procedures: none  Antimicrobials: none   Subjective: No dizziness or lightheadedness  Objective: Vitals:   04/24/21 1100 04/24/21 1200 04/24/21 1300 04/24/21 1400  BP: (!) 106/55 (!) 113/59 (!) 108/57 128/65  Pulse: (!) 55 (!) 52 (!) 50 (!) 51  Resp: '19 14 20 19  '$ Temp:  98 F (36.7 C)    TempSrc:  Oral    SpO2: 93% 96% (!) 89% 90%  Weight:      Height:        Intake/Output Summary (Last 24 hours) at 04/24/2021 1607 Last data filed at 04/24/2021 1200 Gross per 24 hour  Intake 7.44 ml  Output 2500 ml  Net -2492.56 ml   Filed Weights   04/21/21 2040 04/22/21 0500 04/23/21 0409  Weight: (!) 162.7 kg (!) 162.7 kg (!) 162.4 kg    Examination: Nad, calm Cta no w/r/r Regular s1/s2 no gallop Soft benign +bs Mild edema Mood and affect appropriate in current setting      Data Reviewed: I have personally reviewed following labs and imaging studies  CBC: Recent Labs  Lab 04/20/21 2222 04/21/21 0539 04/22/21 0624  WBC 5.5 5.1 3.5*  NEUTROABS 2.2  --  2.3  HGB 14.1 13.9 12.1*  HCT 48.7 47.2 41.6  MCV 96.2 96.7 93.9  PLT 223 178 [id number]   Basic Metabolic Panel: Recent Labs  Lab 04/20/21 2222 04/21/21 0539 04/22/21 0624 04/23/21 0643 04/24/21 0558  NA 140 141 144 140 142  K 5.9* 5.2* 4.9 4.3 4.3  CL 98 97* 94* 92* 91*  CO2 38* 36* 39* 38* 44*  GLUCOSE 106* 91 98 123* 106*  BUN 34* 36* 43* 42* 43*  CREATININE 2.04* 2.13* 1.93* 1.83* 1.67*  CALCIUM 8.8* 8.7* 9.0 8.9 8.9  MG  --   --  2.2  --   --    GFR: Estimated Creatinine Clearance: 64.1 mL/min (A) (by C-G formula based on SCr of 1.67 mg/dL (H)). Liver Function Tests: Recent Labs  Lab 04/20/21 2222 04/23/21 0643  AST 16 10*  ALT 10 7  ALKPHOS 46 32*  BILITOT 0.9 0.9   PROT 6.6 6.3*  ALBUMIN 3.0* 3.8   No results for input(s): LIPASE, AMYLASE in the last 168 hours. No results for input(s): AMMONIA in the last 168 hours. Coagulation Profile: No results for input(s): INR, PROTIME in the last 168 hours. Cardiac Enzymes: No results for input(s): CKTOTAL, CKMB, CKMBINDEX, TROPONINI in the last 168 hours. BNP (last 3 results) No results for input(s): PROBNP in the last 8760 hours. HbA1C: No results for input(s): HGBA1C in the last 72 hours. CBG: Recent Labs  Lab 04/21/21 2035  GLUCAP 128*   Lipid Profile: No results for input(s): CHOL, HDL, LDLCALC, TRIG, CHOLHDL, LDLDIRECT in the last 72 hours. Thyroid Function Tests: Recent Labs    04/22/21 0624  TSH 1.599   Anemia Panel: No results for input(s): VITAMINB12, FOLATE, FERRITIN, TIBC,  IRON, RETICCTPCT in the last 72 hours. Sepsis Labs: No results for input(s): PROCALCITON, LATICACIDVEN in the last 168 hours.  Recent Results (from the past 240 hour(s))  Resp Panel by RT-PCR (Flu A&B, Covid) Nasopharyngeal Swab     Status: None   Collection Time: 04/20/21 10:47 PM   Specimen: Nasopharyngeal Swab; Nasopharyngeal(NP) swabs in vial transport medium  Result Value Ref Range Status   SARS Coronavirus 2 by RT PCR NEGATIVE NEGATIVE Final    Comment: (NOTE) SARS-CoV-2 target nucleic acids are NOT DETECTED.  The SARS-CoV-2 RNA is generally detectable in upper respiratory specimens during the acute phase of infection. The lowest concentration of SARS-CoV-2 viral copies this assay can detect is 138 copies/mL. A negative result does not preclude SARS-Cov-2 infection and should not be used as the sole basis for treatment or other patient management decisions. A negative result may occur with  improper specimen collection/handling, submission of specimen other than nasopharyngeal swab, presence of viral mutation(s) within the areas targeted by this assay, and inadequate number of viral copies(<138  copies/mL). A negative result must be combined with clinical observations, patient history, and epidemiological information. The expected result is Negative.  Fact Sheet for Patients:  EntrepreneurPulse.com.au  Fact Sheet for Healthcare Providers:  IncredibleEmployment.be  This test is no t yet approved or cleared by the [place] FDA and  has been authorized for detection and/or diagnosis of SARS-CoV-2 by FDA under an Emergency Use Authorization (EUA). This EUA will remain  in effect (meaning this test can be used) for the duration of the COVID-19 declaration under Section 564(b)(1) of the Act, 21 U.S.C.section 360bbb-3(b)(1), unless the authorization is terminated  or revoked sooner.       Influenza A by PCR NEGATIVE NEGATIVE Final   Influenza B by PCR NEGATIVE NEGATIVE Final    Comment: (NOTE) The Xpert Xpress SARS-CoV-2/FLU/RSV plus assay is intended as an aid in the diagnosis of influenza from Nasopharyngeal swab specimens and should not be used as a sole basis for treatment. Nasal washings and aspirates are unacceptable for Xpert Xpress SARS-CoV-2/FLU/RSV testing.  Fact Sheet for Patients: EntrepreneurPulse.com.au  Fact Sheet for Healthcare Providers: IncredibleEmployment.be  This test is not yet approved or cleared by the [place] FDA and has been authorized for detection and/or diagnosis of SARS-CoV-2 by FDA under an Emergency Use Authorization (EUA). This EUA will remain in effect (meaning this test can be used) for the duration of the COVID-19 declaration under Section 564(b)(1) of the Act, 21 U.S.C. section 360bbb-3(b)(1), unless the authorization is terminated or revoked.  Performed at [facility], [address] [place], [place] [address]   MRSA Next Gen by PCR, Nasal     Status: None   Collection Time: 04/21/21  8:45 PM   Specimen: Nasal Mucosa; Nasal Swab  Result  Value Ref Range Status   MRSA by PCR Next Gen NOT DETECTED NOT DETECTED Final    Comment: (NOTE) The GeneXpert MRSA Assay (FDA approved for NASAL specimens only), is one component of a comprehensive MRSA colonization surveillance program. It is not intended to diagnose MRSA infection nor to guide or monitor treatment for MRSA infections. Test performance is not FDA approved in patients less than 40 years old. Performed at [facility], [address], [place], [place] [address]          Radiology Studies: No results found.      Scheduled Meds:  aspirin EC  81 mg Oral QHS   atorvastatin  20  mg Oral QHS   buPROPion  150 mg Oral BID   chlorhexidine  15 mL Mouth Rinse BID   Chlorhexidine Gluconate Cloth  6 each Topical Daily   cholecalciferol  1,000 Units Oral QHS   enoxaparin (LOVENOX) injection  0.5 mg/kg Subcutaneous Q24H   gabapentin  100 mg Oral TID   levothyroxine  88 mcg Oral Daily   mouth rinse  15 mL Mouth Rinse q12n4p   methylPREDNISolone (SOLU-MEDROL) injection  40 mg Intravenous Q24H   midodrine  10 mg Oral TID WC   multivitamin-lutein  1 capsule Oral BID   patiromer  8.4 g Oral Daily   Ensure Max Protein  11 oz Oral BID   torsemide  40 mg Oral Daily   cyanocobalamin  1,000 mcg Oral Once per day on Sun Wed   Continuous Infusions:     LOS: 3 days    Time spent: 35 min with more than 50% on [place], MD [facility]   To contact the attending provider between 7A-7P or the covering provider during after hours 7P-7A, please log into the web site [email or web address] and access using universal [place] password for that web site. If you do not have the password, please call the hospital operator.  04/24/2021, 4:07 PM

## 2021-04-24 NOTE — Consult Note (Signed)
   Heart Failure Nurse Navigator Note  HFpEF 60 to 65%.  Right ventricular systolic function is normal.  There are no left ventricular wall motion abnormalities noted.  He presented from the care facility, [facility] after nurses found him confused and horizontal in bed.  O2 saturations were noted to be in the 60s and his heart rate was in the 40s. Chest x-ray revealed pulmonary congestion.  BNP was elevated at 1577.  He was last discharged from the hospital on March 24, 2021.  He also had a recent ED visit on August 8 and was discharged back to the care facility that evening.  Comorbidities:  Anxiety Depression Sleep apnea on CPAP Obesity hypoventilation syndrome Chronic kidney disease  Medications:  Aspirin 81 mg daily Atorvastatin 20 mg daily Midodrine 10 mg 3 times with meals Veltassa 8.4 mg daily Torsemide 40 mg p.o. daily  Labs:  Sodium 142, potassium 4.3, chloride 91, CO2 44, BUN 43, creatinine 1.67 Weight was 162.4 kg. Blood pressure 108/57.  Met with patient in the ICU, he is lying quietly in bed in no acute distress.  Discussed his meals from the care facility, he states that he does add salt to each meal.  Stressed the importance of not using extra salt at the table but he tells me that that the food would taste very boring.  Explore the option of not using salt and seen for a couple weeks if he could eventually learn to eat a meal without adding extra salt.   Also discussed weighing daily and symptoms to report to physician.  Along with the importance of fluid restriction.  He was given the living with heart failure teaching booklet along with the information on taking charge of your heart failure, and low-sodium along with his own magnet.  He has an appointment in the outpatient heart failure clinic on September 8 at 1230.   Pricilla Riffle RN [facility]

## 2021-04-24 NOTE — Plan of Care (Signed)

## 2021-04-25 DIAGNOSIS — J9621 Acute and chronic respiratory failure with hypoxia: Secondary | ICD-10-CM | POA: Diagnosis not present

## 2021-04-25 DIAGNOSIS — L8990 Pressure ulcer of unspecified site, unspecified stage: Secondary | ICD-10-CM | POA: Insufficient documentation

## 2021-04-25 DIAGNOSIS — I5033 Acute on chronic diastolic (congestive) heart failure: Secondary | ICD-10-CM | POA: Diagnosis not present

## 2021-04-25 DIAGNOSIS — I10 Essential (primary) hypertension: Secondary | ICD-10-CM | POA: Diagnosis not present

## 2021-04-25 DIAGNOSIS — N178 Other acute kidney failure: Secondary | ICD-10-CM | POA: Diagnosis not present

## 2021-04-25 NOTE — Progress Notes (Signed)
PROGRESS NOTE    Christopher [name]  [id number] DOB: Dec 05, 1950 DOA: 04/20/2021 PCP: Rusty Aus, MD   Chief complaint.  Shortness of breath. Brief Narrative:  Christopher [name] is a 70 y.o. male with medical history significant for diastolic CHF, depression, anxiety, hypothyroidism and sleep apnea, chronic respiratory failure on 2 L of O2 by nasal cannula, who presented to the emergency room with acute onset of altered mental status with associated dyspnea and hypoxia at his rehab facility.  He has a worsening hypoxemia, was placed on BiPAP.  He has a profound elevation of BNP, started on IV Lasix for diastolic congestive heart failure.  Discussed with daughter, patient has had has been deteriorating since January after he had ankle injury.  He has not been able to walk.  He had increased confusion and hallucinations since April.  He has sleep apnea, not compliant with CPAP.  He currently lives in the nursing home. 8/31 was given a dose of midodrine for low BP last night 9/1 pt has no dizziness, lying in bed. No sob.  9/2 no sob, no cp 9/3 no complaints this AM.  Denies shortness of breath or chest pain.  Assessment & Plan:   Active Problems:   Obstructive sleep apnea (adult) (pediatric)   Morbid obesity with BMI of 45.0-49.9, adult (HCC)   Acute on chronic diastolic CHF (congestive heart failure) (HCC)   Acute on chronic respiratory failure with hypoxia and hypercapnia (HCC)   Benign essential hypertension   Acute respiratory failure (HCC)   Acute renal failure superimposed on stage 3a chronic kidney disease (HCC)   Pressure injury of skin  Acute on chronic respite failure with hypoxemia and hypercapnia. Acute on chronic diastolic congestive heart failure. Morbid obesity  Obesity hypoventilation syndrome. Obstructive sleep apnea. Hypotension. Patient had significant volume overload evidenced by worsening short of breath and hypoxemia, elevated BNP, vascular congestion on chest  x-ray. Echocardiogram is a pending. ABG has significant CO2 retention, will continue BiPAP.  RT will follow. Patient developed borderline hypotension after IV Lasix push.  I will change Lasix to IV drip. I will give 50 g albumin for blood pressure. Patient will be followed in the stepdown unit closely, if blood pressure continue to drop or respiratory status is worse, patient can be transferred to ICU. Patient be continued on BiPAP at nighttime if we can get off him today. I will also placed on 40 mg of IV Solu-Medrol in case he has a COPD.  It may also help if patient has a relative adrenal insufficiency. 9/3 BP better on midodrine IV Lasix was discontinued started on torsemide 40 mg daily per nephrology's recommendation Avoid hypotension     Acute kidney injury on chronic kidney disease stage IIIa. Hyperkalemia. Reviewed the patient labs, patient has a chronic kidney disease stage IIIa.  Worsening renal function due to diuretics. Albumin is given. Baseline creatinine 1.49 AKI likely secondary to cardiorenal syndrome from abnormal hemodynamics Gabapentin teen reduced due to renal failure Avoid hypotension Hypokalemia due to potassium chloride supplementation as outpatient Discontinued 9/3 continue monitoring and continue with SUPPLEMENTATION K more stable at 4.3 Likely will not need it at discharge Renal function improving   Vitamin  B12 deficient anemia. Continue B12   Hypothyroidism- Continue levothyroxine    Failure to thrive. Delirium. Delirium appears to be secondary to CO2 retention and noncompliant with CPAP  Patient long-term prognosis is poor     DVT prophylaxis: Lovenox Code Status: Full Family Communication: None at bedside Disposition Plan:  Status is: Inpatient  Remains inpatient appropriate because:IV treatments appropriate due to intensity of illness or inability to take PO and Inpatient level of care appropriate due to severity of  illness  Dispo: The patient is from: SNF              Anticipated d/c is to: SNF              Patient currently is not medically stable to d/c.   Difficult to place patient No        I/O last 3 completed shifts: In: -  Out: [id number] [Urine:3575] No intake/output data recorded.     Consultants:  nephrology  Procedures: none  Antimicrobials: none   Subjective: No dizziness or lightheadedness, no chest pain  Objective: Vitals:   04/25/21 0500 04/25/21 0600 04/25/21 0700 04/25/21 0800  BP: 106/69 117/65 119/68 (!) 136/103  Pulse: (!) 53 (!) 51 (!) 51 (!) 58  Resp: 17 (!) '21 15 18  '$ Temp:    97.8 F (36.6 C)  TempSrc:    Oral  SpO2: 99% 97% 98% 98%  Weight:      Height:        Intake/Output Summary (Last 24 hours) at 04/25/2021 0849 Last data filed at 04/25/2021 0700 Gross per 24 hour  Intake --  Output 2675 ml  Net -2675 ml   Filed Weights   04/21/21 2040 04/22/21 0500 04/23/21 0409  Weight: (!) 162.7 kg (!) 162.7 kg (!) 162.4 kg    Examination: Nad, calm Cta no w/r/r Regular s1/s2 no gallop Soft benign +bs +edema Grossly intact      Data Reviewed: I have personally reviewed following labs and imaging studies  CBC: Recent Labs  Lab 04/20/21 2222 04/21/21 0539 04/22/21 0624  WBC 5.5 5.1 3.5*  NEUTROABS 2.2  --  2.3  HGB 14.1 13.9 12.1*  HCT 48.7 47.2 41.6  MCV 96.2 96.7 93.9  PLT 223 178 [id number]   Basic Metabolic Panel: Recent Labs  Lab 04/20/21 2222 04/21/21 0539 04/22/21 0624 04/23/21 0643 04/24/21 0558  NA 140 141 144 140 142  K 5.9* 5.2* 4.9 4.3 4.3  CL 98 97* 94* 92* 91*  CO2 38* 36* 39* 38* 44*  GLUCOSE 106* 91 98 123* 106*  BUN 34* 36* 43* 42* 43*  CREATININE 2.04* 2.13* 1.93* 1.83* 1.67*  CALCIUM 8.8* 8.7* 9.0 8.9 8.9  MG  --   --  2.2  --   --    GFR: Estimated Creatinine Clearance: 64.1 mL/min (A) (by C-G formula based on SCr of 1.67 mg/dL (H)). Liver Function Tests: Recent Labs  Lab 04/20/21 2222 04/23/21 0643  AST  16 10*  ALT 10 7  ALKPHOS 46 32*  BILITOT 0.9 0.9  PROT 6.6 6.3*  ALBUMIN 3.0* 3.8   No results for input(s): LIPASE, AMYLASE in the last 168 hours. No results for input(s): AMMONIA in the last 168 hours. Coagulation Profile: No results for input(s): INR, PROTIME in the last 168 hours. Cardiac Enzymes: No results for input(s): CKTOTAL, CKMB, CKMBINDEX, TROPONINI in the last 168 hours. BNP (last 3 results) No results for input(s): PROBNP in the last 8760 hours. HbA1C: No results for input(s): HGBA1C in the last 72 hours. CBG: Recent Labs  Lab 04/21/21 2035  GLUCAP 128*   Lipid Profile: No results for input(s): CHOL, HDL, LDLCALC, TRIG, CHOLHDL, LDLDIRECT in the last 72 hours. Thyroid Function Tests: No results for input(s): TSH, T4TOTAL, FREET4, T3FREE, THYROIDAB in the last 72  hours.  Anemia Panel: No results for input(s): VITAMINB12, FOLATE, FERRITIN, TIBC, IRON, RETICCTPCT in the last 72 hours. Sepsis Labs: No results for input(s): PROCALCITON, LATICACIDVEN in the last 168 hours.  Recent Results (from the past 240 hour(s))  Resp Panel by RT-PCR (Flu A&B, Covid) Nasopharyngeal Swab     Status: None   Collection Time: 04/20/21 10:47 PM   Specimen: Nasopharyngeal Swab; Nasopharyngeal(NP) swabs in vial transport medium  Result Value Ref Range Status   SARS Coronavirus 2 by RT PCR NEGATIVE NEGATIVE Final    Comment: (NOTE) SARS-CoV-2 target nucleic acids are NOT DETECTED.  The SARS-CoV-2 RNA is generally detectable in upper respiratory specimens during the acute phase of infection. The lowest concentration of SARS-CoV-2 viral copies this assay can detect is 138 copies/mL. A negative result does not preclude SARS-Cov-2 infection and should not be used as the sole basis for treatment or other patient management decisions. A negative result may occur with  improper specimen collection/handling, submission of specimen other than nasopharyngeal swab, presence of viral  mutation(s) within the areas targeted by this assay, and inadequate number of viral copies(<138 copies/mL). A negative result must be combined with clinical observations, patient history, and epidemiological information. The expected result is Negative.  Fact Sheet for Patients:  EntrepreneurPulse.com.au  Fact Sheet for Healthcare Providers:  IncredibleEmployment.be  This test is no t yet approved or cleared by the [place] FDA and  has been authorized for detection and/or diagnosis of SARS-CoV-2 by FDA under an Emergency Use Authorization (EUA). This EUA will remain  in effect (meaning this test can be used) for the duration of the COVID-19 declaration under Section 564(b)(1) of the Act, 21 U.S.C.section 360bbb-3(b)(1), unless the authorization is terminated  or revoked sooner.       Influenza A by PCR NEGATIVE NEGATIVE Final   Influenza B by PCR NEGATIVE NEGATIVE Final    Comment: (NOTE) The Xpert Xpress SARS-CoV-2/FLU/RSV plus assay is intended as an aid in the diagnosis of influenza from Nasopharyngeal swab specimens and should not be used as a sole basis for treatment. Nasal washings and aspirates are unacceptable for Xpert Xpress SARS-CoV-2/FLU/RSV testing.  Fact Sheet for Patients: EntrepreneurPulse.com.au  Fact Sheet for Healthcare Providers: IncredibleEmployment.be  This test is not yet approved or cleared by the [place] FDA and has been authorized for detection and/or diagnosis of SARS-CoV-2 by FDA under an Emergency Use Authorization (EUA). This EUA will remain in effect (meaning this test can be used) for the duration of the COVID-19 declaration under Section 564(b)(1) of the Act, 21 U.S.C. section 360bbb-3(b)(1), unless the authorization is terminated or revoked.  Performed at [facility], [address] [place], [place] [address]   MRSA Next Gen by PCR, Nasal      Status: None   Collection Time: 04/21/21  8:45 PM   Specimen: Nasal Mucosa; Nasal Swab  Result Value Ref Range Status   MRSA by PCR Next Gen NOT DETECTED NOT DETECTED Final    Comment: (NOTE) The GeneXpert MRSA Assay (FDA approved for NASAL specimens only), is one component of a comprehensive MRSA colonization surveillance program. It is not intended to diagnose MRSA infection nor to guide or monitor treatment for MRSA infections. Test performance is not FDA approved in patients less than 33 years old. Performed at [facility], [address] [place], [place] [address]          Radiology Studies: No results found.      Scheduled Meds:  aspirin EC  81 mg Oral QHS   atorvastatin  20 mg Oral QHS   buPROPion  150 mg Oral BID   chlorhexidine  15 mL Mouth Rinse BID   Chlorhexidine Gluconate Cloth  6 each Topical Daily   cholecalciferol  1,000 Units Oral QHS   enoxaparin (LOVENOX) injection  0.5 mg/kg Subcutaneous Q24H   gabapentin  100 mg Oral TID   levothyroxine  88 mcg Oral Daily   mouth rinse  15 mL Mouth Rinse q12n4p   methylPREDNISolone (SOLU-MEDROL) injection  40 mg Intravenous Q24H   midodrine  10 mg Oral TID WC   multivitamin-lutein  1 capsule Oral BID   patiromer  8.4 g Oral Daily   Ensure Max Protein  11 oz Oral BID   torsemide  40 mg Oral Daily   cyanocobalamin  1,000 mcg Oral Once per day on Sun Wed   Continuous Infusions:     LOS: 4 days    Time spent: 35 min with more than 50% on [place], MD [facility]   To contact the attending provider between 7A-7P or the covering provider during after hours 7P-7A, please log into the web site [email or web address] and access using universal [place] password for that web site. If you do not have the password, please call the hospital operator.  04/25/2021, 8:49 AM

## 2021-04-25 NOTE — Progress Notes (Signed)
Report given to [employer] .Marland Kitchen. pt going to room 219C

## 2021-04-25 NOTE — Progress Notes (Signed)
Report attempted to give but they don't have a nurse assigned at the moment.... they will call back when they have a nurse.

## 2021-04-26 DIAGNOSIS — G4733 Obstructive sleep apnea (adult) (pediatric): Secondary | ICD-10-CM | POA: Diagnosis not present

## 2021-04-26 DIAGNOSIS — I10 Essential (primary) hypertension: Secondary | ICD-10-CM | POA: Diagnosis not present

## 2021-04-26 DIAGNOSIS — I5033 Acute on chronic diastolic (congestive) heart failure: Secondary | ICD-10-CM | POA: Diagnosis not present

## 2021-04-26 DIAGNOSIS — J9621 Acute and chronic respiratory failure with hypoxia: Secondary | ICD-10-CM | POA: Diagnosis not present

## 2021-04-26 LAB — CREATININE, SERUM
Creatinine, Ser: 1.48 mg/dL — ABNORMAL HIGH (ref 0.61–1.24)
GFR, Estimated: 51 mL/min — ABNORMAL LOW (ref >60)

## 2021-04-26 LAB — POTASSIUM: Potassium: 4.4 mmol/L (ref 3.5–5.1)

## 2021-04-26 NOTE — Progress Notes (Signed)
Physical Therapy Treatment Patient Details Name: Christopher [name] MRN: [id number] DOB: 1951-01-02 Today's Date: 04/26/2021    History of Present Illness Pt is a 70 y/o M admitted from [facility] on 04/20/21 with c/c of SOB & hypotension. Pt is being treated for acute on chronic respiratory failure with hypoxia and hypercarbia secondary to acute on chronic diastolic CHF and obesity hypoventilation syndrome/OSA. The patient has associated acute kidney injury superimposed on stage IIIa chronic kidney disease, likely prerenal secondary to his acute CHF, with subsequent hyperkalemia. PMH: dCHF, depression, anxiety, hypothyroidism, sleep apnea, chronic respiratory failure on 2L O2,    PT Comments    PT returned to room due to being contacted by nursing staff as pt was unable to transfer out of recliner. Pt attempts sit>stand multiple times but is unsuccessful with powering to stand even with max assist +1 as pt is fearful of transitioning hand placement during transfer. PT set pt up for lateral scoot from drop arm recliner to bed with pt able to complete transfer with extra time & cuing for technique & CGA overall. Pt requires min assist for sit>supine. Continue to recommend STR upon d/c.     Follow Up Recommendations  SNF;Supervision/Assistance - 24 hour     Equipment Recommendations  None recommended by PT    Recommendations for Other Services       Precautions / Restrictions Precautions Precautions: Fall Restrictions Weight Bearing Restrictions: Yes LLE Weight Bearing: Weight bearing as tolerated Other Position/Activity Restrictions: in cam boot    Mobility  Bed Mobility Overal bed mobility: Needs Assistance Bed Mobility: Sit to Supine     Supine to sit: Supervision;HOB elevated Sit to supine: Min assist   General bed mobility comments: assistance to elevate LLE onto bed    Transfers Overall transfer level: Needs assistance Equipment used: Rolling walker (2  wheeled) Transfers: Lateral/Scoot Transfers;Sit to/from Stand Sit to Stand:  (unable to successfully achieve sit>stand from recliner with max assist +1 despite multiple attempts as pt fearful of transferring UE from pushing on recliner armrest to RW) Stand pivot transfers: Min assist      Lateral/Scoot Transfers: Min guard (max cuing for anterior weight shifting & head/hips relationship to keep buttocks back on bed, extra time to complete scoots drop arm recliner>bed on L, cuing to clear buttocks over armrest) General transfer comment: cuing for hand placement on RW, anterior pelvic shift  Ambulation/Gait Ambulation/Gait assistance: Min assist Gait Distance (Feet): 2 Feet Assistive device: Rolling walker (2 wheeled) Gait Pattern/deviations: Decreased step length - right;Decreased step length - left;Decreased stride length;Decreased dorsiflexion - right;Decreased dorsiflexion - left Gait velocity: decreased   General Gait Details: side steps to L to pivot to Dentist    Modified Rankin (Stroke Patients Only)       Balance Overall balance assessment: Needs assistance Sitting-balance support: Feet supported;Bilateral upper extremity supported Sitting balance-Leahy Scale: Fair     Standing balance support: Bilateral upper extremity supported;During functional activity Standing balance-Leahy Scale: Fair Standing balance comment: BUE support on RW                            Cognition Arousal/Alertness: Awake/alert Behavior During Therapy: Flat affect Overall Cognitive Status: No family/caregiver present to determine baseline cognitive functioning  Exercises      General Comments General comments (skin integrity, edema, etc.): Pt performs lateral scoots along EOB to L with supervision & extra time to complete task.      Pertinent Vitals/Pain Pain Assessment:  Faces Faces Pain Scale: Hurts a little bit Pain Location: buttocks 2/2 "bed sores" -some blood noted on bed sheets after lateral scoot transfer & nurse made aware Pain Descriptors / Indicators: Discomfort Pain Intervention(s): Monitored during session    Home Living Family/patient expects to be discharged to:: Skilled nursing facility                    Prior Function        Comments: Pt with functional decline since ankle ORIF in April. Residing in SNF prior to admission. Unable to recall the last time he was ambulatory but also unable to recall PLOF.   PT Goals (current goals can now be found in the care plan section) Acute Rehab PT Goals Patient Stated Goal: none stated PT Goal Formulation: With patient Time For Goal Achievement: 05/10/21 Potential to Achieve Goals: Fair Progress towards PT goals: Progressing toward goals    Frequency    Min 2X/week      PT Plan Current plan remains appropriate    Co-evaluation              AM-PAC PT "6 Clicks" Mobility   Outcome Measure  Help needed turning from your back to your side while in a flat bed without using bedrails?: A Little Help needed moving from lying on your back to sitting on the side of a flat bed without using bedrails?: A Little Help needed moving to and from a bed to a chair (including a wheelchair)?: A Little Help needed standing up from a chair using your arms (e.g., wheelchair or bedside chair)?: Total Help needed to walk in hospital room?: A Lot Help needed climbing 3-5 steps with a railing? : Total 6 Click Score: 13    End of Session Equipment Utilized During Treatment: Oxygen Activity Tolerance: Patient tolerated treatment well Patient left: in bed;with call bell/phone within reach;with nursing/sitter in room Nurse Communication: Mobility status PT Visit Diagnosis: Unsteadiness on feet (R26.81);Muscle weakness (generalized) (M62.81);Difficulty in walking, not elsewhere classified (R26.2)      Time: [id number] PT Time Calculation (min) (ACUTE ONLY): 15 min  Charges:  $Therapeutic Activity: 8-22 mins                     Lavone Nian, PT, DPT 04/26/21, 2:34 PM    Waunita Schooner 04/26/2021, 2:32 PM

## 2021-04-26 NOTE — NC FL2 (Addendum)
[place] LEVEL OF CARE SCREENING TOOL     IDENTIFICATION  Patient Name: Christopher [name] Birthdate: 1950-09-09 Sex: male Admission Date (Current Location): 04/20/2021  [place] and [place] Number:  Engineering geologist and Address:  [facility], [address] [place], [place] [address]      Provider Number: [id number]  Attending Physician Name and Address:  Nolberto Hanlon, MD  Relative Name and Phone Number:  Mylz, Stivers Daughter [phone]    Current Level of Care: Hospital Recommended Level of Care: [place] Prior Approval Number:    Date Approved/Denied:   PASRR Number:  [id number]  Discharge Plan:      Current Diagnoses: Patient Active Problem List   Diagnosis Date Noted   Pressure injury of skin 04/25/2021   Acute respiratory failure ([place]) 04/21/2021   Acute renal failure superimposed on stage 3a chronic kidney disease ([place]) 04/21/2021   Acute on chronic respiratory failure with hypoxia and hypercapnia ([place]) Q000111Q   Acute metabolic encephalopathy Q000111Q   CKD (chronic kidney disease), stage IIIa 03/22/2021   Leukocytosis 03/22/2021   Elevated troponin 03/22/2021   Open wound of left foot 03/22/2021   Osteomyelitis of ankle or foot, acute, left ([place]) 03/17/2021   Acute on chronic respiratory failure ([place]) 03/17/2021   Osteomyelitis ([place]) 12/16/2020   Closed left ankle fracture 12/16/2020   Depression 12/16/2020   Medicare annual wellness visit, initial 11/14/2019   B12 deficiency 11/14/2019   Hyperlipidemia, mixed 10/31/2019   Primary osteoarthritis of right knee 03/16/2019   Obstructive sleep apnea (adult) (pediatric) 10/31/2018   Traumatic incomplete tear of left rotator cuff 10/02/2018   Morbid obesity with BMI of 45.0-49.9, adult ([place]) 09/20/2018   Acquired hypothyroidism 09/20/2018   Acute on chronic diastolic CHF (congestive heart failure) ([place]) 09/20/2018   Benign essential  hypertension 09/20/2018    Orientation RESPIRATION BLADDER Height & Weight     Self, Situation, Place  O2 External catheter Weight: (!) 347 lb 10.7 oz (157.7 kg) Height:  '5\' 11"'$  (180.3 cm)  BEHAVIORAL SYMPTOMS/MOOD NEUROLOGICAL BOWEL NUTRITION STATUS        Diet (heart diet, thin liquids)  AMBULATORY STATUS COMMUNICATION OF NEEDS Skin   Extensive Assist Verbally Bruising (stage 2 R buttock, L thigh)                       Personal Care Assistance Level of Assistance  Bathing, Feeding, Dressing Bathing Assistance: Maximum assistance Feeding assistance: Limited assistance Dressing Assistance: Maximum assistance     Functional Limitations Info             SPECIAL CARE FACTORS FREQUENCY  PT (By licensed PT), OT (By licensed OT)     PT Frequency: 5 times per week OT Frequency: 5 times per week            Contractures      Additional Factors Info  Code Status, Allergies Code Status Info: full Allergies Info: albuterol, gramineae pollens, cats           Current Medications (04/26/2021):  This is the current hospital active medication list Current Facility-Administered Medications  Medication Dose Route Frequency Provider Last Rate Last Admin   acetaminophen (TYLENOL) tablet 650 mg  650 mg Oral Q6H PRN Mansy, Jan A, MD       Or   acetaminophen (TYLENOL) suppository 650 mg  650 mg Rectal Q6H PRN Mansy, Arvella Merles, MD       aspirin EC tablet  81 mg  81 mg Oral QHS Mansy, Jan A, MD   81 mg at 04/25/21 2019   atorvastatin (LIPITOR) tablet 20 mg  20 mg Oral QHS Mansy, Jan A, MD   20 mg at 04/25/21 2019   buPROPion [facility] SR) 12 hr tablet 150 mg  150 mg Oral BID Mansy, Jan A, MD   150 mg at 04/26/21 [id number]   chlorhexidine (PERIDEX) 0.12 % solution 15 mL  15 mL Mouth Rinse BID Nolberto Hanlon, MD   15 mL at 04/26/21 [id number]   Chlorhexidine Gluconate Cloth 2 % PADS 6 each  6 each Topical Daily Mansy, Jan A, MD   6 each at 04/26/21 1118   cholecalciferol (VITAMIN D3) tablet 1,000  Units  1,000 Units Oral QHS Mansy, Jan A, MD   1,000 Units at 04/25/21 2019   enoxaparin (LOVENOX) injection 85 mg  0.5 mg/kg Subcutaneous Q24H Mansy, Jan A, MD   85 mg at 04/26/21 [id number]   gabapentin (NEURONTIN) capsule 100 mg  100 mg Oral TID Sharen Hones, MD   100 mg at 04/26/21 0927   iohexol (OMNIPAQUE) 350 MG/ML injection 100 mL  100 mL Intravenous Once PRN Arta Silence, MD       levalbuterol Penne Lash) nebulizer solution 0.63 mg  0.63 mg Inhalation Q4H PRN Mansy, Jan A, MD       levothyroxine (SYNTHROID) tablet 88 mcg  88 mcg Oral Daily Mansy, Jan A, MD   88 mcg at 04/26/21 [id number]   magnesium hydroxide (MILK OF MAGNESIA) suspension 30 mL  30 mL Oral Daily PRN Mansy, Jan A, MD       MEDLINE mouth rinse  15 mL Mouth Rinse q12n4p Nolberto Hanlon, MD   15 mL at 04/26/21 1239   midodrine (PROAMATINE) tablet 10 mg  10 mg Oral TID WC Nolberto Hanlon, MD   10 mg at 04/26/21 1238   multivitamin-lutein (OCUVITE-LUTEIN) capsule 1 capsule  1 capsule Oral BID Sharen Hones, MD   1 capsule at 04/26/21 0927   ondansetron (ZOFRAN) tablet 4 mg  4 mg Oral Q6H PRN Mansy, Jan A, MD       Or   ondansetron [facility]) injection 4 mg  4 mg Intravenous Q6H PRN Mansy, Arvella Merles, MD       patiromer Daryll Drown) packet 8.4 g  8.4 g Oral Daily Mansy, Jan A, MD   8.4 g at 04/25/21 1005   protein supplement (ENSURE MAX) liquid  11 oz Oral BID Mansy, Jan A, MD   11 oz at 04/26/21 1000   torsemide (DEMADEX) tablet 40 mg  40 mg Oral Daily Murlean Iba, MD   40 mg at 04/26/21 [id number]   traZODone (DESYREL) tablet 25 mg  25 mg Oral QHS PRN Mansy, Jan A, MD       vitamin A & D ointment 1 application  1 application Topical PRN Mansy, Jan A, MD       vitamin B-12 (CYANOCOBALAMIN) tablet 1,000 mcg  1,000 mcg Oral Once per day on Sun Wed Mansy, Jan A, MD   1,000 mcg at 04/22/21 2126     Discharge Medications: Please see discharge summary for a list of discharge medications.  Relevant Imaging Results:  Relevant Lab  Results:   Additional Information SS #: [id number]  [place], LCSW

## 2021-04-26 NOTE — Evaluation (Signed)
Physical Therapy Evaluation Patient Details Name: Christopher [name] MRN: [id number] DOB: 1951-01-22 Today's Date: 04/26/2021   History of Present Illness  Pt is a 70 y/o M admitted from [facility] on 04/20/21 with c/c of SOB & hypotension. Pt is being treated for acute on chronic respiratory failure with hypoxia and hypercarbia secondary to acute on chronic diastolic CHF and obesity hypoventilation syndrome/OSA. The patient has associated acute kidney injury superimposed on stage IIIa chronic kidney disease, likely prerenal secondary to his acute CHF, with subsequent hyperkalemia. PMH: dCHF, depression, anxiety, hypothyroidism, sleep apnea, chronic respiratory failure on 2L O2,  Clinical Impression  Pt seen for PT evaluation with pt unable to recall PLOF. Pt is able to complete supine>sit with supervision with hospital bed features but requires min assist & BUE support to complete sit>stand x 3 times from EOB. Pt is able to complete stand pivot to recliner with RW & min assist with decreased step length BLE & cuing for safe hand placement/technique. Pt endorses fear of falling. At this time, would recommend STR to maximize independence with functional mobility, reduce fall risk & decrease caregiver burden.     Follow Up Recommendations SNF;Supervision/Assistance - 24 hour    Equipment Recommendations  None recommended by PT    Recommendations for Other Services       Precautions / Restrictions Precautions Precautions: Fall Restrictions Weight Bearing Restrictions: Yes LLE Weight Bearing: Weight bearing as tolerated (in cam boot)      Mobility  Bed Mobility Overal bed mobility: Needs Assistance Bed Mobility: Supine to Sit     Supine to sit: Supervision;HOB elevated     General bed mobility comments: use of bed rails    Transfers Overall transfer level: Needs assistance Equipment used: Rolling walker (2 wheeled) Transfers: Sit to/from [facility] Sit to  Stand: Min assist Stand pivot transfers: Min assist       General transfer comment: cuing for hand placement on RW, anterior pelvic shift  Ambulation/Gait Ambulation/Gait assistance: Min assist Gait Distance (Feet): 2 Feet Assistive device: Rolling walker (2 wheeled) Gait Pattern/deviations: Decreased step length - right;Decreased step length - left;Decreased stride length;Decreased dorsiflexion - right;Decreased dorsiflexion - left Gait velocity: decreased   General Gait Details: side steps to L to pivot to Physicist, medical    Modified Rankin (Stroke Patients Only)       Balance Overall balance assessment: Needs assistance Sitting-balance support: Feet supported;Bilateral upper extremity supported Sitting balance-Leahy Scale: Fair     Standing balance support: Bilateral upper extremity supported;During functional activity Standing balance-Leahy Scale: Fair Standing balance comment: BUE support on RW                             Pertinent Vitals/Pain Pain Assessment: No/denies pain    Home Living Family/patient expects to be discharged to:: Skilled nursing facility                      Prior Function           Comments: Pt with functional decline since ankle ORIF in April. Residing in SNF prior to admission. Unable to recall the last time he was ambulatory but also unable to recall PLOF.     Hand Dominance        Extremity/Trunk Assessment   Upper Extremity Assessment Upper Extremity Assessment: Generalized weakness  Lower Extremity Assessment Lower Extremity Assessment: Generalized weakness (PT dons cam boot LLE total assist)       Communication   Communication: HOH  Cognition Arousal/Alertness: Awake/alert Behavior During Therapy: WFL for tasks assessed/performed Overall Cognitive Status: No family/caregiver present to determine baseline cognitive functioning                                         General Comments General comments (skin integrity, edema, etc.): Pt performs lateral scoots along EOB to L with supervision & extra time to complete task.    Exercises     Assessment/Plan    PT Assessment Patient needs continued PT services  PT Problem List Decreased strength;Decreased mobility;Decreased safety awareness;Obesity;Decreased activity tolerance;Decreased cognition;Cardiopulmonary status limiting activity;Decreased skin integrity;Decreased balance;Decreased knowledge of use of DME       PT Treatment Interventions Therapeutic activities;DME instruction;Modalities;Therapeutic exercise;Gait training;Patient/family education;Balance training;Wheelchair mobility training;Functional mobility training;Neuromuscular re-education;Manual techniques    PT Goals (Current goals can be found in the Care Plan section)  Acute Rehab PT Goals Patient Stated Goal: none stated PT Goal Formulation: With patient Time For Goal Achievement: 05/10/21 Potential to Achieve Goals: Fair    Frequency Min 2X/week   Barriers to discharge        Co-evaluation               AM-PAC PT "6 Clicks" Mobility  Outcome Measure Help needed turning from your back to your side while in a flat bed without using bedrails?: A Little Help needed moving from lying on your back to sitting on the side of a flat bed without using bedrails?: A Little Help needed moving to and from a bed to a chair (including a wheelchair)?: A Little Help needed standing up from a chair using your arms (e.g., wheelchair or bedside chair)?: Total Help needed to walk in hospital room?: A Lot Help needed climbing 3-5 steps with a railing? : Total 6 Click Score: 13    End of Session Equipment Utilized During Treatment: Oxygen Activity Tolerance: Patient tolerated treatment well Patient left: in chair;with chair alarm set;with call bell/phone within reach Nurse Communication: Mobility status PT  Visit Diagnosis: Unsteadiness on feet (R26.81);Muscle weakness (generalized) (M62.81);Difficulty in walking, not elsewhere classified (R26.2)    Time: 1017-1040 PT Time Calculation (min) (ACUTE ONLY): 23 min   Charges:   PT Evaluation $PT Eval Moderate Complexity: 1 Mod PT Treatments $Therapeutic Activity: 8-22 mins        Lavone Nian, PT, DPT 04/26/21, 2:28 PM   Waunita Schooner 04/26/2021, 2:27 PM

## 2021-04-26 NOTE — Progress Notes (Signed)
PROGRESS NOTE    Christopher [name]  [id number] DOB: 1951/05/20 DOA: 04/20/2021 PCP: Rusty Aus, MD   Chief complaint.  Shortness of breath. Brief Narrative:  Christopher [name] is a 70 y.o. male with medical history significant for diastolic CHF, depression, anxiety, hypothyroidism and sleep apnea, chronic respiratory failure on 2 L of O2 by nasal cannula, who presented to the emergency room with acute onset of altered mental status with associated dyspnea and hypoxia at his rehab facility.  He has a worsening hypoxemia, was placed on BiPAP.  He has a profound elevation of BNP, started on IV Lasix for diastolic congestive heart failure.  Discussed with daughter, patient has had has been deteriorating since January after he had ankle injury.  He has not been able to walk.  He had increased confusion and hallucinations since April.  He has sleep apnea, not compliant with CPAP.  He currently lives in the nursing home. 8/31 was given a dose of midodrine for low BP last night 9/1 pt has no dizziness, lying in bed. No sob.  9/2 no sob, no cp 9/3 no complaints this AM.  Denies shortness of breath or chest pain. 9/4 discussed with patient taking Foley out.  He is willing to try.  No complaints.  Assessment & Plan:   Active Problems:   Obstructive sleep apnea (adult) (pediatric)   Morbid obesity with BMI of 45.0-49.9, adult (HCC)   Acute on chronic diastolic CHF (congestive heart failure) (HCC)   Acute on chronic respiratory failure with hypoxia and hypercapnia (HCC)   Benign essential hypertension   Acute respiratory failure (HCC)   Acute renal failure superimposed on stage 3a chronic kidney disease (HCC)   Pressure injury of skin  Acute on chronic respite failure with hypoxemia and hypercapnia. Acute on chronic diastolic congestive heart failure. Morbid obesity  Obesity hypoventilation syndrome. Obstructive sleep apnea. Hypotension. Patient had significant volume overload evidenced by  worsening short of breath and hypoxemia, elevated BNP, vascular congestion on chest x-ray. Echocardiogram is a pending. ABG has significant CO2 retention, will continue BiPAP.  RT will follow. Patient developed borderline hypotension after IV Lasix push.  I will change Lasix to IV drip. I will give 50 g albumin for blood pressure. Patient will be followed in the stepdown unit closely, if blood pressure continue to drop or respiratory status is worse, patient can be transferred to ICU. Patient be continued on BiPAP at nighttime if we can get off him today. I will also placed on 40 mg of IV Solu-Medrol in case he has a COPD.  It may also help if patient has a relative adrenal insufficiency. 9/4 BP better might return.   Nephrology discontinue IV Lasix currently on torsemide 40 mg and will continue  Avoid hypotension  Will dc steroids.       Acute kidney injury on chronic kidney disease stage IIIa. Hyperkalemia. Reviewed the patient labs, patient has a chronic kidney disease stage IIIa.  Worsening renal function due to diuretics. Albumin is given. Baseline creatinine 1.49 AKI likely secondary to cardiorenal syndrome from abnormal hemodynamics Gabapentin teen reduced due to renal failure Avoid hypotension Hypokalemia due to potassium chloride supplementation as outpatient Discontinued 9/4 renal function improving.  Continue with Veltassa Likely will not needed on discharge Discontinue Foley we will do bladder scan every 6 hours x24 hours with in/out cath if >250cc.    Vitamin  B12 deficient anemia. Continue B12 supplementation   Hypothyroidism- Continue levothyroxine    Failure to thrive. Delirium.  Delirium appears to be secondary to CO2 retention and noncompliant with CPAP  Patient long-term prognosis is poor     DVT prophylaxis: Lovenox Code Status: Full Family Communication: None at bedside Disposition Plan:    Status is: Inpatient  Remains inpatient  appropriate because:IV treatments appropriate due to intensity of illness or inability to take PO and Inpatient level of care appropriate due to severity of illness  Dispo: The patient is from: SNF              Anticipated d/c is to: SNF              Patient currently is not medically stable to d/c.   Difficult to place patient No    SNF pending. PT consulted.    I/O last 3 completed shifts: In: -  Out: 2030 [Urine:2030] No intake/output data recorded.     Consultants:  nephrology  Procedures: none  Antimicrobials: none   Subjective: No sob, no cp  Objective: Vitals:   04/26/21 0035 04/26/21 0442 04/26/21 0500 04/26/21 0804  BP:  124/60  (!) 104/56  Pulse: (!) 54 (!) 52  (!) 56  Resp:  15  18  Temp:  98.6 F (37 C)  97.6 F (36.4 C)  TempSrc:  Oral  Oral  SpO2: 99% 96%  99%  Weight:   (!) 157.7 kg   Height:        Intake/Output Summary (Last 24 hours) at 04/26/2021 0809 Last data filed at 04/25/2021 2356 Gross per 24 hour  Intake --  Output 1080 ml  Net -1080 ml   Filed Weights   04/22/21 0500 04/23/21 0409 04/26/21 0500  Weight: (!) 162.7 kg (!) 162.4 kg (!) 157.7 kg    Examination: Nad, calm Cta no w/r Regular s1/s2 no gallop Soft benign +bs +edema      Data Reviewed: I have personally reviewed following labs and imaging studies  CBC: Recent Labs  Lab 04/20/21 2222 04/21/21 0539 04/22/21 0624  WBC 5.5 5.1 3.5*  NEUTROABS 2.2  --  2.3  HGB 14.1 13.9 12.1*  HCT 48.7 47.2 41.6  MCV 96.2 96.7 93.9  PLT 223 178 [id number]   Basic Metabolic Panel: Recent Labs  Lab 04/20/21 2222 04/21/21 0539 04/22/21 0624 04/23/21 0643 04/24/21 0558 04/26/21 0502  NA 140 141 144 140 142  --   K 5.9* 5.2* 4.9 4.3 4.3 4.4  CL 98 97* 94* 92* 91*  --   CO2 38* 36* 39* 38* 44*  --   GLUCOSE 106* 91 98 123* 106*  --   BUN 34* 36* 43* 42* 43*  --   CREATININE 2.04* 2.13* 1.93* 1.83* 1.67* 1.48*  CALCIUM 8.8* 8.7* 9.0 8.9 8.9  --   MG  --   --  2.2  --   --    --    GFR: Estimated Creatinine Clearance: 71.1 mL/min (A) (by C-G formula based on SCr of 1.48 mg/dL (H)). Liver Function Tests: Recent Labs  Lab 04/20/21 2222 04/23/21 0643  AST 16 10*  ALT 10 7  ALKPHOS 46 32*  BILITOT 0.9 0.9  PROT 6.6 6.3*  ALBUMIN 3.0* 3.8   No results for input(s): LIPASE, AMYLASE in the last 168 hours. No results for input(s): AMMONIA in the last 168 hours. Coagulation Profile: No results for input(s): INR, PROTIME in the last 168 hours. Cardiac Enzymes: No results for input(s): CKTOTAL, CKMB, CKMBINDEX, TROPONINI in the last 168 hours. BNP (last 3 results) No results  for input(s): PROBNP in the last 8760 hours. HbA1C: No results for input(s): HGBA1C in the last 72 hours. CBG: Recent Labs  Lab 04/21/21 2035  GLUCAP 128*   Lipid Profile: No results for input(s): CHOL, HDL, LDLCALC, TRIG, CHOLHDL, LDLDIRECT in the last 72 hours. Thyroid Function Tests: No results for input(s): TSH, T4TOTAL, FREET4, T3FREE, THYROIDAB in the last 72 hours.  Anemia Panel: No results for input(s): VITAMINB12, FOLATE, FERRITIN, TIBC, IRON, RETICCTPCT in the last 72 hours. Sepsis Labs: No results for input(s): PROCALCITON, LATICACIDVEN in the last 168 hours.  Recent Results (from the past 240 hour(s))  Resp Panel by RT-PCR (Flu A&B, Covid) Nasopharyngeal Swab     Status: None   Collection Time: 04/20/21 10:47 PM   Specimen: Nasopharyngeal Swab; Nasopharyngeal(NP) swabs in vial transport medium  Result Value Ref Range Status   SARS Coronavirus 2 by RT PCR NEGATIVE NEGATIVE Final    Comment: (NOTE) SARS-CoV-2 target nucleic acids are NOT DETECTED.  The SARS-CoV-2 RNA is generally detectable in upper respiratory specimens during the acute phase of infection. The lowest concentration of SARS-CoV-2 viral copies this assay can detect is 138 copies/mL. A negative result does not preclude SARS-Cov-2 infection and should not be used as the sole basis for treatment  or other patient management decisions. A negative result may occur with  improper specimen collection/handling, submission of specimen other than nasopharyngeal swab, presence of viral mutation(s) within the areas targeted by this assay, and inadequate number of viral copies(<138 copies/mL). A negative result must be combined with clinical observations, patient history, and epidemiological information. The expected result is Negative.  Fact Sheet for Patients:  EntrepreneurPulse.com.au  Fact Sheet for Healthcare Providers:  IncredibleEmployment.be  This test is no t yet approved or cleared by the [place] FDA and  has been authorized for detection and/or diagnosis of SARS-CoV-2 by FDA under an Emergency Use Authorization (EUA). This EUA will remain  in effect (meaning this test can be used) for the duration of the COVID-19 declaration under Section 564(b)(1) of the Act, 21 U.S.C.section 360bbb-3(b)(1), unless the authorization is terminated  or revoked sooner.       Influenza A by PCR NEGATIVE NEGATIVE Final   Influenza B by PCR NEGATIVE NEGATIVE Final    Comment: (NOTE) The Xpert Xpress SARS-CoV-2/FLU/RSV plus assay is intended as an aid in the diagnosis of influenza from Nasopharyngeal swab specimens and should not be used as a sole basis for treatment. Nasal washings and aspirates are unacceptable for Xpert Xpress SARS-CoV-2/FLU/RSV testing.  Fact Sheet for Patients: EntrepreneurPulse.com.au  Fact Sheet for Healthcare Providers: IncredibleEmployment.be  This test is not yet approved or cleared by the [place] FDA and has been authorized for detection and/or diagnosis of SARS-CoV-2 by FDA under an Emergency Use Authorization (EUA). This EUA will remain in effect (meaning this test can be used) for the duration of the COVID-19 declaration under Section 564(b)(1) of the Act, 21 U.S.C. section  360bbb-3(b)(1), unless the authorization is terminated or revoked.  Performed at [facility], [address] [place], [place] [address]   MRSA Next Gen by PCR, Nasal     Status: None   Collection Time: 04/21/21  8:45 PM   Specimen: Nasal Mucosa; Nasal Swab  Result Value Ref Range Status   MRSA by PCR Next Gen NOT DETECTED NOT DETECTED Final    Comment: (NOTE) The GeneXpert MRSA Assay (FDA approved for NASAL specimens only), is one component of a comprehensive MRSA colonization surveillance program.  It is not intended to diagnose MRSA infection nor to guide or monitor treatment for MRSA infections. Test performance is not FDA approved in patients less than 23 years old. Performed at [facility], [address], [place], [place] [address]          Radiology Studies: No results found.      Scheduled Meds:  aspirin EC  81 mg Oral QHS   atorvastatin  20 mg Oral QHS   buPROPion  150 mg Oral BID   chlorhexidine  15 mL Mouth Rinse BID   Chlorhexidine Gluconate Cloth  6 each Topical Daily   cholecalciferol  1,000 Units Oral QHS   enoxaparin (LOVENOX) injection  0.5 mg/kg Subcutaneous Q24H   gabapentin  100 mg Oral TID   levothyroxine  88 mcg Oral Daily   mouth rinse  15 mL Mouth Rinse q12n4p   methylPREDNISolone (SOLU-MEDROL) injection  40 mg Intravenous Q24H   midodrine  10 mg Oral TID WC   multivitamin-lutein  1 capsule Oral BID   patiromer  8.4 g Oral Daily   Ensure Max Protein  11 oz Oral BID   torsemide  40 mg Oral Daily   cyanocobalamin  1,000 mcg Oral Once per day on Sun Wed   Continuous Infusions:     LOS: 5 days    Time spent: 35 min with more than 50% on [place], MD [facility]   To contact the attending provider between 7A-7P or the covering provider during after hours 7P-7A, please log into the web site [email or web address] and access using universal [place] password for that web site. If you do not have the  password, please call the hospital operator.  04/26/2021, 8:09 AM

## 2021-04-26 NOTE — Plan of Care (Signed)
Continuing with plan of care. 

## 2021-04-26 NOTE — Progress Notes (Signed)
Bipap settings decreased from 20/8 , to 16/6 due to patient stating it was too much pressure.  Patient tolerating new settings better.

## 2021-04-26 NOTE — TOC Initial Note (Addendum)
Transition of Care [facility]) - Initial/Assessment Note    Patient Details  Name: Christopher [name] MRN: [id number] Date of Birth: [birth year]  Transition of Care [facility]) CM/SW Contact:    Magnus Ivan, LCSW Phone Number: 04/26/2021, 10:17 AM  Clinical Narrative:      CSW spoke with patient's daughter Christopher [name] via phone. Christopher [name] reported patient has been at [facility] for short term rehab since around 7/29. She is unsure if he was in his copay days. She stated if he is in copay days, they cannot afford these. She stated they do not feel safe with patient returning home due to his physical care needs. She reported patient needs more rehab to get stronger before coming back home with [place] and patient's wife.She reported they are interested in other SNFs as well if others accept patient. PT eval is pending. Left VM for Tanya with [facility].    3:00- SNF work up started. Spoke with Lavella Lemons with [facility] who will need to verify if patient is in copay days, but should be able to accept patient back. Will need new insurance auth. Family was also interested in other SNF options so sent out to others as well to see if he gets other bed offers.    Expected Discharge Plan: Skilled Nursing Facility Barriers to Discharge: Continued Medical Work up   Patient Goals and CMS Choice Patient states their goals for this hospitalization and ongoing recovery are:: wants him to go back to rehab to get stronger CMS Medicare.gov Compare Post Acute Care list provided to:: Patient Represenative (must comment) Choice offered to / list presented to : Adult Children  Expected Discharge Plan and Services Expected Discharge Plan: [place] arrangements for the past 2 months: Single Family Home                                      Prior Living Arrangements/Services Living arrangements for the past 2 months: Single Family Home Lives with:: Adult Children, Spouse Patient language and need  for interpreter reviewed:: Yes        Need for Family Participation in Patient Care: Yes (Comment) Care giver support system in place?: Yes (comment) Current home services: DME Criminal Activity/Legal Involvement Pertinent to Current Situation/Hospitalization: No - Comment as needed  Activities of Daily Living Home Assistive Devices/Equipment: CPAP, Dentures (specify type), Eyeglasses, [employer], Oxygen, Wheelchair ADL Screening (condition at time of admission) Patient's cognitive ability adequate to safely complete daily activities?: Yes Is the patient deaf or have difficulty hearing?: Yes Does the patient have difficulty seeing, even when wearing glasses/contacts?: No Does the patient have difficulty concentrating, remembering, or making decisions?: No Patient able to express need for assistance with ADLs?: Yes Does the patient have difficulty dressing or bathing?: Yes Independently performs ADLs?: No Communication: Independent Dressing (OT): Needs assistance Is this a change from baseline?: Pre-admission baseline Grooming: Needs assistance Is this a change from baseline?: Pre-admission baseline Feeding: Independent Bathing: Needs assistance Is this a change from baseline?: Pre-admission baseline Toileting: Needs assistance Is this a change from baseline?: Pre-admission baseline In/Out Bed: Dependent Is this a change from baseline?: Pre-admission baseline Walks in Home: Dependent Is this a change from baseline?: Pre-admission baseline Does the patient have difficulty walking or climbing stairs?: Yes Weakness of Legs: Both Weakness of Arms/Hands: None  Permission Sought/Granted Permission sought to share information with : Facility  Contact Representative Permission granted to share information with : Yes, Verbal Permission Granted (by daughter Christopher [name])     Permission granted to share info w AGENCY: SNFs        Emotional Assessment         Alcohol / Substance Use: Not  Applicable Psych Involvement: No (comment)  Admission diagnosis:  Acute respiratory failure ([place]) [J96.00] Acute on chronic respiratory failure with hypoxia and hypercapnia ([place]) [id number], J96.22] Patient Active Problem List   Diagnosis Date Noted   Pressure injury of skin 04/25/2021   Acute respiratory failure ([place]) 04/21/2021   Acute renal failure superimposed on stage 3a chronic kidney disease ([place]) 04/21/2021   Acute on chronic respiratory failure with hypoxia and hypercapnia ([place]) [id number]   Acute metabolic encephalopathy Q000111Q   CKD (chronic kidney disease), stage IIIa 03/22/2021   Leukocytosis 03/22/2021   Elevated troponin 03/22/2021   Open wound of left foot 03/22/2021   Osteomyelitis of ankle or foot, acute, left ([place]) 03/17/2021   Acute on chronic respiratory failure ([place]) 03/17/2021   Osteomyelitis ([place]) 12/16/2020   Closed left ankle fracture 12/16/2020   Depression 12/16/2020   Medicare annual wellness visit, initial 11/14/2019   B12 deficiency 11/14/2019   Hyperlipidemia, mixed 10/31/2019   Primary osteoarthritis of right knee 03/16/2019   Obstructive sleep apnea (adult) (pediatric) 10/31/2018   Traumatic incomplete tear of left rotator cuff 10/02/2018   Morbid obesity with BMI of 45.0-49.9, adult ([place]) 09/20/2018   Acquired hypothyroidism 09/20/2018   Acute on chronic diastolic CHF (congestive heart failure) ([place]) 09/20/2018   Benign essential hypertension 09/20/2018   PCP:  Rusty Aus, MD Pharmacy:   [facility] [id number], [place] [address] [place] [address] Phone: [phone] Fax: [phone]     Social Determinants of Health (SDOH) Interventions    Readmission Risk Interventions Readmission Risk Prevention Plan 04/26/2021  Transportation Screening Complete  PCP or Specialist Appt within 3-5 Days Complete  HRI or [place] Complete  Social Work  Consult for [place] Planning/Counseling Complete  Palliative Care Screening Not Applicable  Medication Review Press photographer) Complete

## 2021-04-27 DIAGNOSIS — I5033 Acute on chronic diastolic (congestive) heart failure: Secondary | ICD-10-CM | POA: Diagnosis not present

## 2021-04-27 DIAGNOSIS — N178 Other acute kidney failure: Secondary | ICD-10-CM | POA: Diagnosis not present

## 2021-04-27 DIAGNOSIS — J9621 Acute and chronic respiratory failure with hypoxia: Secondary | ICD-10-CM | POA: Diagnosis not present

## 2021-04-27 NOTE — Plan of Care (Signed)
  Problem: Safety: Goal: Ability to remain free from injury will improve Outcome: Progressing   Problem: Skin Integrity: Goal: Risk for impaired skin integrity will decrease Outcome: Not Progressing   Problem: Elimination: Goal: Will not experience complications related to urinary retention Outcome: Completed/Met

## 2021-04-27 NOTE — Progress Notes (Signed)
PROGRESS NOTE    Christopher [name]  [id number] DOB: [birth year] DOA: 04/20/2021 PCP: Rusty Aus, MD   Chief complaint.  Shortness of breath. Brief Narrative:  Christopher [name] is a 70 y.o. male with medical history significant for diastolic CHF, depression, anxiety, hypothyroidism and sleep apnea, chronic respiratory failure on 2 L of O2 by nasal cannula, who presented to the emergency room with acute onset of altered mental status with associated dyspnea and hypoxia at his rehab facility.  He has a worsening hypoxemia, was placed on BiPAP.  He has a profound elevation of BNP, started on IV Lasix for diastolic congestive heart failure.  Discussed with daughter, patient has had has been deteriorating since January after he had ankle injury.  He has not been able to walk.  He had increased confusion and hallucinations since April.  He has sleep apnea, not compliant with CPAP.  He currently lives in the nursing home.  Amerys week: 8/30-9/5 She was started on midodrine due to ongoing hypotension. And started on IV Lasix.  He was also placed on IV steroids for COPD.  This was discontinued.  Nephrology was following and finally discontinued IV Lasix and started on torsemide 40 mg daily.  He is now awaiting SNF bed offers.   Assessment & Plan:   Active Problems:   Obstructive sleep apnea (adult) (pediatric)   Morbid obesity with BMI of 45.0-49.9, adult (HCC)   Acute on chronic diastolic CHF (congestive heart failure) (HCC)   Acute on chronic respiratory failure with hypoxia and hypercapnia (HCC)   Benign essential hypertension   Acute respiratory failure (HCC)   Acute renal failure superimposed on stage 3a chronic kidney disease (HCC)   Pressure injury of skin  Acute on chronic respite failure with hypoxemia and hypercapnia. Acute on chronic diastolic congestive heart failure. Morbid obesity  Obesity hypoventilation syndrome. Obstructive sleep apnea. Hypotension. Patient had significant  volume overload evidenced by worsening short of breath and hypoxemia, elevated BNP, vascular congestion on chest x-ray. Echocardiogram is a pending. ABG has significant CO2 retention, will continue BiPAP.  RT was following.  Was given albumin due to his blood pressure.  He was initially in stepdown unit for close blood pressure monitoring and respiratory status since it was worse. Patient was on BiPAP at nighttime. He was started initially IV steroids Solu-Medrol in case he had COPD.  It may also have helped if patient had relative adrenal insufficiency.  However this was discontinued. 9/5 Pt was started on midodrine so we could diurese. Was getting iv lasix gtt.  Was continued on BiPAP at nighttime We discontinued his IV steroids as he did not require it Nephrology discontinued IV Lasix to torsemide 40 mg He was transferred to medical floor with telemetry Avoid hypotension      Acute kidney injury on chronic kidney disease stage IIIa. Hyperkalemia. Reviewed the patient labs, patient has a chronic kidney disease stage IIIa.  Worsening renal function due to diuretics. Albumin is given. Baseline creatinine 1.49 AKI likely secondary to cardiorenal syndrome from abnormal hemodynamics Gabapentin teen reduced due to renal failure Avoid hypotension Hypokalemia due to potassium chloride supplementation as outpatient Discontinued 9/5 renal function improving Potassium stable Continue with [place] Per nephrology likely will not needed on discharge Discontinued Foley and doing bladder scan x24 hours to see if he has retention    Vitamin  B12 deficient anemia. Continue B12 supplementation   Hypothyroidism- Continue levothyroxine    Failure to thrive. Delirium. Delirium appears to be secondary to  CO2 retention and noncompliant with CPAP  Patient long-term prognosis is poor   PT was consulted recommends SNF  DVT prophylaxis: Lovenox Code Status: Full Family Communication:  None at bedside Disposition Plan:    Status is: Inpatient  Remains inpatient appropriate because:unsafe discharge Dispo: The patient is from: SNF              Anticipated d/c is to: SNF              Patient currently : is medically stable.    Difficult to place patient No    Pt awaiting SNF versus going home with home health.  Family cannot take care of him.  Case management is working on this.    I/O last 3 completed shifts: In: -  Out: 2600 [Urine:2600] Total I/O In: -  Out: 150 [Urine:150]     Consultants:  nephrology  Procedures: none  Antimicrobials: none   Subjective: No shortness of breath or chest pain or any other complaints  Objective: Vitals:   04/26/21 1931 04/27/21 0420 04/27/21 0500 04/27/21 0740  BP: 113/64 130/65  115/71  Pulse: 60 (!) 55  60  Resp: '20 16  18  '$ Temp: 98.2 F (36.8 C) 98.2 F (36.8 C)  98.5 F (36.9 C)  TempSrc: Oral Oral  Oral  SpO2: 94% 97%  96%  Weight:   (!) 157.1 kg   Height:        Intake/Output Summary (Last 24 hours) at 04/27/2021 1301 Last data filed at 04/27/2021 0818 Gross per 24 hour  Intake --  Output 1100 ml  Net -1100 ml   Filed Weights   04/23/21 0409 04/26/21 0500 04/27/21 0500  Weight: (!) 162.4 kg (!) 157.7 kg (!) 157.1 kg    Examination: NAD, calm CTA no wheeze rales rhonchi's Regular S1-S2 no gallops Soft benign positive bowel sounds Awake and alert, mood and affect appropriate in current setting      Data Reviewed: I have personally reviewed following labs and imaging studies  CBC: Recent Labs  Lab 04/20/21 2222 04/21/21 0539 04/22/21 0624  WBC 5.5 5.1 3.5*  NEUTROABS 2.2  --  2.3  HGB 14.1 13.9 12.1*  HCT 48.7 47.2 41.6  MCV 96.2 96.7 93.9  PLT 223 178 [id number]   Basic Metabolic Panel: Recent Labs  Lab 04/20/21 2222 04/21/21 0539 04/22/21 0624 04/23/21 0643 04/24/21 0558 04/26/21 0502  NA 140 141 144 140 142  --   K 5.9* 5.2* 4.9 4.3 4.3 4.4  CL 98 97* 94* 92* 91*  --    CO2 38* 36* 39* 38* 44*  --   GLUCOSE 106* 91 98 123* 106*  --   BUN 34* 36* 43* 42* 43*  --   CREATININE 2.04* 2.13* 1.93* 1.83* 1.67* 1.48*  CALCIUM 8.8* 8.7* 9.0 8.9 8.9  --   MG  --   --  2.2  --   --   --    GFR: Estimated Creatinine Clearance: 70.9 mL/min (A) (by C-G formula based on SCr of 1.48 mg/dL (H)). Liver Function Tests: Recent Labs  Lab 04/20/21 2222 04/23/21 0643  AST 16 10*  ALT 10 7  ALKPHOS 46 32*  BILITOT 0.9 0.9  PROT 6.6 6.3*  ALBUMIN 3.0* 3.8   No results for input(s): LIPASE, AMYLASE in the last 168 hours. No results for input(s): AMMONIA in the last 168 hours. Coagulation Profile: No results for input(s): INR, PROTIME in the last 168 hours. Cardiac Enzymes: No results  for input(s): CKTOTAL, CKMB, CKMBINDEX, TROPONINI in the last 168 hours. BNP (last 3 results) No results for input(s): PROBNP in the last 8760 hours. HbA1C: No results for input(s): HGBA1C in the last 72 hours. CBG: Recent Labs  Lab 04/21/21 2035  GLUCAP 128*   Lipid Profile: No results for input(s): CHOL, HDL, LDLCALC, TRIG, CHOLHDL, LDLDIRECT in the last 72 hours. Thyroid Function Tests: No results for input(s): TSH, T4TOTAL, FREET4, T3FREE, THYROIDAB in the last 72 hours.  Anemia Panel: No results for input(s): VITAMINB12, FOLATE, FERRITIN, TIBC, IRON, RETICCTPCT in the last 72 hours. Sepsis Labs: No results for input(s): PROCALCITON, LATICACIDVEN in the last 168 hours.  Recent Results (from the past 240 hour(s))  Resp Panel by RT-PCR (Flu A&B, Covid) Nasopharyngeal Swab     Status: None   Collection Time: 04/20/21 10:47 PM   Specimen: Nasopharyngeal Swab; Nasopharyngeal(NP) swabs in vial transport medium  Result Value Ref Range Status   SARS Coronavirus 2 by RT PCR NEGATIVE NEGATIVE Final    Comment: (NOTE) SARS-CoV-2 target nucleic acids are NOT DETECTED.  The SARS-CoV-2 RNA is generally detectable in upper respiratory specimens during the acute phase of infection.  The lowest concentration of SARS-CoV-2 viral copies this assay can detect is 138 copies/mL. A negative result does not preclude SARS-Cov-2 infection and should not be used as the sole basis for treatment or other patient management decisions. A negative result may occur with  improper specimen collection/handling, submission of specimen other than nasopharyngeal swab, presence of viral mutation(s) within the areas targeted by this assay, and inadequate number of viral copies(<138 copies/mL). A negative result must be combined with clinical observations, patient history, and epidemiological information. The expected result is Negative.  Fact Sheet for Patients:  EntrepreneurPulse.com.au  Fact Sheet for Healthcare Providers:  IncredibleEmployment.be  This test is no t yet approved or cleared by the [place] FDA and  has been authorized for detection and/or diagnosis of SARS-CoV-2 by FDA under an Emergency Use Authorization (EUA). This EUA will remain  in effect (meaning this test can be used) for the duration of the COVID-19 declaration under Section 564(b)(1) of the Act, 21 U.S.C.section 360bbb-3(b)(1), unless the authorization is terminated  or revoked sooner.       Influenza A by PCR NEGATIVE NEGATIVE Final   Influenza B by PCR NEGATIVE NEGATIVE Final    Comment: (NOTE) The Xpert Xpress SARS-CoV-2/FLU/RSV plus assay is intended as an aid in the diagnosis of influenza from Nasopharyngeal swab specimens and should not be used as a sole basis for treatment. Nasal washings and aspirates are unacceptable for Xpert Xpress SARS-CoV-2/FLU/RSV testing.  Fact Sheet for Patients: EntrepreneurPulse.com.au  Fact Sheet for Healthcare Providers: IncredibleEmployment.be  This test is not yet approved or cleared by the [place] FDA and has been authorized for detection and/or diagnosis of SARS-CoV-2 by FDA  under an Emergency Use Authorization (EUA). This EUA will remain in effect (meaning this test can be used) for the duration of the COVID-19 declaration under Section 564(b)(1) of the Act, 21 U.S.C. section 360bbb-3(b)(1), unless the authorization is terminated or revoked.  Performed at [facility], [address] [place], [place] [address]   MRSA Next Gen by PCR, Nasal     Status: None   Collection Time: 04/21/21  8:45 PM   Specimen: Nasal Mucosa; Nasal Swab  Result Value Ref Range Status   MRSA by PCR Next Gen NOT DETECTED NOT DETECTED Final    Comment: (NOTE) The GeneXpert MRSA  Assay (FDA approved for NASAL specimens only), is one component of a comprehensive MRSA colonization surveillance program. It is not intended to diagnose MRSA infection nor to guide or monitor treatment for MRSA infections. Test performance is not FDA approved in patients less than 82 years old. Performed at [facility], [address] [place], [place] [address]          Radiology Studies: No results found.      Scheduled Meds:  aspirin EC  81 mg Oral QHS   atorvastatin  20 mg Oral QHS   buPROPion  150 mg Oral BID   chlorhexidine  15 mL Mouth Rinse BID   Chlorhexidine Gluconate Cloth  6 each Topical Daily   cholecalciferol  1,000 Units Oral QHS   enoxaparin (LOVENOX) injection  0.5 mg/kg Subcutaneous Q24H   gabapentin  100 mg Oral TID   levothyroxine  88 mcg Oral Daily   mouth rinse  15 mL Mouth Rinse q12n4p   midodrine  10 mg Oral TID WC   multivitamin-lutein  1 capsule Oral BID   patiromer  8.4 g Oral Daily   Ensure Max Protein  11 oz Oral BID   torsemide  40 mg Oral Daily   cyanocobalamin  1,000 mcg Oral Once per day on Sun Wed   Continuous Infusions:     LOS: 6 days    Time spent: 35 min with more than 50% on [place], MD [facility]   To contact the attending provider between 7A-7P or the covering provider during after hours  7P-7A, please log into the web site [email or web address] and access using universal [place] password for that web site. If you do not have the password, please call the hospital operator.  04/27/2021, 1:01 PM

## 2021-04-27 NOTE — Progress Notes (Signed)
Patient refused bipap for the night.  Continues to wear nasal cannula at 3LPM.

## 2021-04-28 LAB — CBC
HCT: 45.4 % (ref 39.0–52.0)
Hemoglobin: 13.3 g/dL (ref 13.0–17.0)
MCH: 26.3 pg (ref 26.0–34.0)
MCHC: 29.3 g/dL — ABNORMAL LOW (ref 30.0–36.0)
MCV: 89.9 fL (ref 80.0–100.0)
Platelets: 208 10*3/uL (ref 150–400)
RBC: 5.05 MIL/uL (ref 4.22–5.81)
RDW: 16.2 % — ABNORMAL HIGH (ref 11.5–15.5)
WBC: 6 10*3/uL (ref 4.0–10.5)
nRBC: 0 % (ref 0.0–0.2)

## 2021-04-28 NOTE — TOC Progression Note (Addendum)
Transition of Care [facility]) - Progression Note    Patient Details  Name: Christopher [name] MRN: [id number] Date of Birth: [birth year]  [facility]) CM/SW Contact  Beverly Sessions, RN Phone Number: 04/28/2021, 9:23 AM  Clinical Narrative:     Message sent to [facility] Lem[facility] at [facility] to follow up and determine if patient is in his copay days  No new bed offers  Per MD medically cleared for discharge    945 am - daughter updated via phone Auth started through navi portal  Expected Discharge Plan: [place] Barriers to Discharge: Continued Medical Work up  Expected Discharge Plan and Services Expected Discharge Plan: [place] arrangements for the past 2 months: Single Family Home                                       Social Determinants of Health (SDOH) Interventions    Readmission Risk Interventions Readmission Risk Prevention Plan 04/26/2021  Transportation Screening Complete  PCP or Specialist Appt within 3-5 Days Complete  HRI or [place] Complete  Social Work Consult for [place] Planning/Counseling Complete  Palliative Care Screening Not Applicable  Medication Review Press photographer) Complete

## 2021-04-28 NOTE — Care Management Important Message (Signed)
Important Message  Patient Details  Name: Christopher [name] MRN: [id number] Date of Birth: [birth year]   Medicare Important Message Given:  Yes     [name] 04/28/2021, 3:00 PM

## 2021-04-28 NOTE — Plan of Care (Signed)
  Problem: Clinical Measurements: Goal: Will remain free from infection Outcome: Progressing   Problem: Clinical Measurements: Goal: Respiratory complications will improve Outcome: Progressing   

## 2021-04-28 NOTE — TOC Progression Note (Signed)
Transition of Care [facility]) - Progression Note    Patient Details  Name: Christopher [name] MRN: [id number] Date of Birth: 08-22-1951  Transition of Care [facility]) CM/SW Contact  Beverly Sessions, RN Phone Number: 04/28/2021, 1:04 PM  Clinical Narrative:     Josem Kaufmann still pending   Expected Discharge Plan: [place] Barriers to Discharge: Continued Medical Work up  Expected Discharge Plan and Services Expected Discharge Plan: [place] arrangements for the past 2 months: Single Family Home                                       Social Determinants of Health (SDOH) Interventions    Readmission Risk Interventions Readmission Risk Prevention Plan 04/26/2021  Transportation Screening Complete  PCP or Specialist Appt within 3-5 Days Complete  HRI or [place] Complete  Social Work Consult for [place] Planning/Counseling Complete  Palliative Care Screening Not Applicable  Medication Review Press photographer) Complete

## 2021-04-28 NOTE — TOC Progression Note (Signed)
Transition of Care [facility]) - Progression Note    Patient Details  Name: Christopher [name] MRN: [id number] Date of Birth: 02/04/1951  Transition of Care [facility]) CM/SW Contact  Beverly Sessions, RN Phone Number: 04/28/2021, 10:20 AM  Clinical Narrative:     Notified by Everlene Balls that Josem Kaufmann would have to go to medical director for review   Expected Discharge Plan: [place] Barriers to Discharge: Continued Medical Work up  Expected Discharge Plan and Services Expected Discharge Plan: [place] arrangements for the past 2 months: Single Family Home                                       Social Determinants of Health (SDOH) Interventions    Readmission Risk Interventions Readmission Risk Prevention Plan 04/26/2021  Transportation Screening Complete  PCP or Specialist Appt within 3-5 Days Complete  HRI or [place] Complete  Social Work Consult for [place] Planning/Counseling Complete  Palliative Care Screening Not Applicable  Medication Review Press photographer) Complete

## 2021-04-28 NOTE — Progress Notes (Signed)
Physical Therapy Treatment Patient Details Name: Christopher [name] MRN: [id number] DOB: [birth year] Today's Date: 04/28/2021    History of Present Illness Pt is a 70 y/o M admitted from [place] on 04/20/21 with c/c of SOB & hypotension. Pt is being treated for acute on chronic respiratory failure with hypoxia and hypercarbia secondary to acute on chronic diastolic CHF and obesity hypoventilation syndrome/OSA. The patient has associated acute kidney injury superimposed on stage IIIa chronic kidney disease, likely prerenal secondary to his acute CHF, with subsequent hyperkalemia. PMH: dCHF, depression, anxiety, hypothyroidism, sleep apnea, chronic respiratory failure on 2L O2,    PT Comments    Pt resting in bed upon PT arrival; pt agreeable to PT session.  SBA semi-supine to sitting edge of bed; min to mod assist to stand from elevated bed x3 trials (2nd assist for safety); and CGA to SBA lateral scooting towards L a couple feet along bed.  Limited standing tolerance noted limiting ability to progress ambulation.  Will continue to focus on strengthening, balance, and progressive functional mobility during hospitalization.   Follow Up Recommendations  SNF;Supervision/Assistance - 24 hour     Equipment Recommendations  Wheelchair (measurements PT);Wheelchair cushion (measurements PT);Rolling walker with 5" wheels;3in1 (PT) (bariatric)    Recommendations for Other Services       Precautions / Restrictions Precautions Precautions: Fall Restrictions Weight Bearing Restrictions: Yes LLE Weight Bearing: Weight bearing as tolerated Other Position/Activity Restrictions: in cam boot    Mobility  Bed Mobility Overal bed mobility: Needs Assistance Bed Mobility: Supine to Sit;Sit to Supine     Supine to sit: Supervision;HOB elevated Sit to supine: Min assist;Mod assist;HOB elevated (assist for trunk and B LE's)   General bed mobility comments: vc's for technique; increased effort for  pt to perform as much as he could on his own    Transfers Overall transfer level: Needs assistance Equipment used: Rolling walker (2 wheeled) (bariatric) Transfers: Sit to/from Stand;Lateral/Scoot Transfers   Stand pivot transfers: Min assist;Mod assist;From elevated surface      Lateral/Scoot Transfers: Supervision;Min guard;From elevated surface;+2 safety/equipment (lateral scooting a couple feet towards L along edge of bed; vc's for technique) General transfer comment: assist to stand from elevated bed height x3 trials; vc's for UE placement; 2nd assist for safety  Ambulation/Gait Ambulation/Gait assistance: Min assist;+2 safety/equipment Gait Distance (Feet):  (sidestepped to L about 1 foot with walker) Assistive device: Rolling walker (2 wheeled) (bariatric)   Gait velocity: decreased   General Gait Details: decreased stance time L LE; increased effort and time to take steps; vc's for positioning   Stairs             Wheelchair Mobility    Modified Rankin (Stroke Patients Only)       Balance Overall balance assessment: Needs assistance Sitting-balance support: No upper extremity supported;Feet supported Sitting balance-Leahy Scale: Good Sitting balance - Comments: steady sitting reaching within BOS   Standing balance support: Bilateral upper extremity supported Standing balance-Leahy Scale: Fair Standing balance comment: heavy B UE support on RW for static standing balance                            Cognition Arousal/Alertness: Awake/alert Behavior During Therapy: Flat affect Overall Cognitive Status: No family/caregiver present to determine baseline cognitive functioning  General Comments: Oriented to at least person and place      Exercises General Exercises - Lower Extremity Long Arc Quad: AROM;Strengthening;Both;10 reps;Seated Hip Flexion/Marching: AROM;Strengthening;Both;10 reps;Seated     General Comments  Nursing cleared pt for participation in physical therapy.  Pt agreeable to PT session.      Pertinent Vitals/Pain Pain Assessment: No/denies pain Pain Intervention(s): Limited activity within patient's tolerance;Monitored during session;Repositioned Vitals (HR and O2 on 2 L via nasal cannula) stable and WFL throughout treatment session.    Home Living                      Prior Function            PT Goals (current goals can now be found in the care plan section) Acute Rehab PT Goals Patient Stated Goal: none stated PT Goal Formulation: With patient Time For Goal Achievement: 05/10/21 Potential to Achieve Goals: Fair Progress towards PT goals: Progressing toward goals    Frequency    Min 2X/week      PT Plan Current plan remains appropriate    Co-evaluation              AM-PAC PT "6 Clicks" Mobility   Outcome Measure  Help needed turning from your back to your side while in a flat bed without using bedrails?: A Little Help needed moving from lying on your back to sitting on the side of a flat bed without using bedrails?: A Lot Help needed moving to and from a bed to a chair (including a wheelchair)?: A Little Help needed standing up from a chair using your arms (e.g., wheelchair or bedside chair)?: Total Help needed to walk in hospital room?: A Lot Help needed climbing 3-5 steps with a railing? : Total 6 Click Score: 12    End of Session Equipment Utilized During Treatment: Oxygen;Gait belt Activity Tolerance: Patient tolerated treatment well Patient left: in bed;with call bell/phone within reach;with bed alarm set;Other (comment) (B heels floating via pillow support) Nurse Communication: Mobility status;Precautions PT Visit Diagnosis: Unsteadiness on feet (R26.81);Muscle weakness (generalized) (M62.81);Difficulty in walking, not elsewhere classified (R26.2)     Time: [id number] PT Time Calculation (min) (ACUTE ONLY): 45  min  Charges:  $Therapeutic Exercise: 8-22 mins $Therapeutic Activity: 23-37 mins                     Leitha Bleak, PT 04/28/21, 3:25 PM

## 2021-04-28 NOTE — TOC Progression Note (Signed)
Transition of Care [facility]) - Progression Note    Patient Details  Name: Christopher [name] MRN: [id number] Date of Birth: 08-Nov-1950  [facility] CM/SW [place], LCSW Phone Number: 04/28/2021, 4:35 PM  Clinical Narrative:  Insurance authorization approved: [id number]. Valid 9/6-9/8. Left voicemail for SNF admissions coordinator to notify and see if they could take him today or tomorrow.   Expected Discharge Plan: [place] Barriers to Discharge: Continued Medical Work up  Expected Discharge Plan and Services Expected Discharge Plan: [place] arrangements for the past 2 months: Single Family Home                                       Social Determinants of Health (SDOH) Interventions    Readmission Risk Interventions Readmission Risk Prevention Plan 04/26/2021  Transportation Screening Complete  PCP or Specialist Appt within 3-5 Days Complete  HRI or [place] Complete  Social Work Consult for [place] Planning/Counseling Complete  Palliative Care Screening Not Applicable  Medication Review Press photographer) Complete

## 2021-04-28 NOTE — Progress Notes (Signed)
PROGRESS NOTE    Christopher [name]  [id number] DOB: 1951/06/19 DOA: 04/20/2021 PCP: Rusty Aus, MD   Chief complaint.  Shortness of breath. Brief Narrative:   Christopher [name] is a 70 y.o. male with medical history significant for diastolic CHF, depression, anxiety, hypothyroidism and sleep apnea, chronic respiratory failure on 2 L of O2 by nasal cannula, who presented to the emergency room with acute onset of altered mental status with associated dyspnea and hypoxia at his rehab facility.  He has a worsening hypoxemia, was placed on BiPAP.  He has a profound elevation of BNP, started on IV Lasix for diastolic congestive heart failure.   Discussed with daughter, patient has had has been deteriorating since January after he had ankle injury.  He has not been able to walk.  He had increased confusion and hallucinations since April.  He has sleep apnea, not compliant with CPAP.    Patient is treated with IV Lasix, IV steroids.  Condition had improved.  Currently on torsemide 40 mg daily.  Pending SNF placement.    Assessment & Plan:   Active Problems:   Obstructive sleep apnea (adult) (pediatric)   Morbid obesity with BMI of 45.0-49.9, adult (HCC)   Acute on chronic diastolic CHF (congestive heart failure) (HCC)   Acute on chronic respiratory failure with hypoxia and hypercapnia (HCC)   Benign essential hypertension   Acute respiratory failure (HCC)   Acute renal failure superimposed on stage 3a chronic kidney disease (HCC)   Pressure injury of skin  Acute on chronic respiratory failure with hypoxemia and hypercapnia. Acute on chronic diastolic congestive heart failure. Morbid obesity  Obesity hypoventilation syndrome. Obstructive sleep apnea. Hypotension. Patient condition had improved, currently back to baseline with 2 L oxygen and wears CPAP at nighttime. Currently on midodrine for hypotension. Pending SNF placement.   Acute kidney injury on chronic kidney disease stage  IIIa secondary to cardiorenal syndrome. Hyperkalemia. Condition has improved, I will discontinue Veltassa now, recheck a BMP tomorrow. Continue torsemide.  Vitamin B12 deficient anemia  Continue oral supplement.  Failure to thrive. Delirium. Condition has improved.    DVT prophylaxis: Lovenox Code Status: full Family Communication: daughter updated Disposition Plan:    Status is: Inpatient  Remains inpatient appropriate because:Unsafe d/c plan  Dispo: The patient is from: SNF              Anticipated d/c is to: SNF              Patient currently is medically stable to d/c.   Difficult to place patient No        I/O last 3 completed shifts: In: 780 [P.O.:780] Out: 2850 [Urine:2850] No intake/output data recorded.     Consultants:  none  Procedures: nephrology  Antimicrobials: none  Subjective: Patient condition much improved since last time I see him a week ago.  Currently he has some baseline confusion, but short of breath has improved.  No cough. No fever chills per No dysuria hematuria  He still very weak, not able to walk.  Objective: Vitals:   04/27/21 1917 04/28/21 0414 04/28/21 0456 04/28/21 0743  BP: 109/62 104/87  120/65  Pulse: 66 69  82  Resp: '16 17  18  '$ Temp: 98 F (36.7 C) (!) 97.4 F (36.3 C)  98.3 F (36.8 C)  TempSrc: Oral Oral  Oral  SpO2: 93% 96%  94%  Weight:   (!) 154.8 kg   Height:        Intake/Output Summary (  Last 24 hours) at 04/28/2021 1130 Last data filed at 04/28/2021 0454 Gross per 24 hour  Intake 780 ml  Output 2200 ml  Net -1420 ml   Filed Weights   04/26/21 0500 04/27/21 0500 04/28/21 0456  Weight: (!) 157.7 kg (!) 157.1 kg (!) 154.8 kg    Examination:  General exam: Appears calm and comfortable  Respiratory system: Clear to auscultation. Respiratory effort normal. Cardiovascular system: S1 & S2 heard, RRR. No JVD, murmurs, rubs, gallops or clicks.  Gastrointestinal system: Abdomen is nondistended, soft  and nontender. No organomegaly or masses felt. Normal bowel sounds heard. Central nervous system: Alert and oriented x2.  No focal neurological deficits. Extremities: Chronic leg edema. Skin: No rashes, lesions or ulcers Psychiatry: Judgement and insight appear normal. Mood & affect appropriate.     Data Reviewed: I have personally reviewed following labs and imaging studies  CBC: Recent Labs  Lab 04/22/21 0624 04/28/21 0548  WBC 3.5* 6.0  NEUTROABS 2.3  --   HGB 12.1* 13.3  HCT 41.6 45.4  MCV 93.9 89.9  PLT 187 [id number]   Basic Metabolic Panel: Recent Labs  Lab 04/22/21 0624 04/23/21 0643 04/24/21 0558 04/26/21 0502  NA 144 140 142  --   K 4.9 4.3 4.3 4.4  CL 94* 92* 91*  --   CO2 39* 38* 44*  --   GLUCOSE 98 123* 106*  --   BUN 43* 42* 43*  --   CREATININE 1.93* 1.83* 1.67* 1.48*  CALCIUM 9.0 8.9 8.9  --   MG 2.2  --   --   --    GFR: Estimated Creatinine Clearance: 70.4 mL/min (A) (by C-G formula based on SCr of 1.48 mg/dL (H)). Liver Function Tests: Recent Labs  Lab 04/23/21 0643  AST 10*  ALT 7  ALKPHOS 32*  BILITOT 0.9  PROT 6.3*  ALBUMIN 3.8   No results for input(s): LIPASE, AMYLASE in the last 168 hours. No results for input(s): AMMONIA in the last 168 hours. Coagulation Profile: No results for input(s): INR, PROTIME in the last 168 hours. Cardiac Enzymes: No results for input(s): CKTOTAL, CKMB, CKMBINDEX, TROPONINI in the last 168 hours. BNP (last 3 results) No results for input(s): PROBNP in the last 8760 hours. HbA1C: No results for input(s): HGBA1C in the last 72 hours. CBG: Recent Labs  Lab 04/21/21 2035  GLUCAP 128*   Lipid Profile: No results for input(s): CHOL, HDL, LDLCALC, TRIG, CHOLHDL, LDLDIRECT in the last 72 hours. Thyroid Function Tests: No results for input(s): TSH, T4TOTAL, FREET4, T3FREE, THYROIDAB in the last 72 hours. Anemia Panel: No results for input(s): VITAMINB12, FOLATE, FERRITIN, TIBC, IRON, RETICCTPCT in the last  72 hours. Sepsis Labs: No results for input(s): PROCALCITON, LATICACIDVEN in the last 168 hours.  Recent Results (from the past 240 hour(s))  Resp Panel by RT-PCR (Flu A&B, Covid) Nasopharyngeal Swab     Status: None   Collection Time: 04/20/21 10:47 PM   Specimen: Nasopharyngeal Swab; Nasopharyngeal(NP) swabs in vial transport medium  Result Value Ref Range Status   SARS Coronavirus 2 by RT PCR NEGATIVE NEGATIVE Final    Comment: (NOTE) SARS-CoV-2 target nucleic acids are NOT DETECTED.  The SARS-CoV-2 RNA is generally detectable in upper respiratory specimens during the acute phase of infection. The lowest concentration of SARS-CoV-2 viral copies this assay can detect is 138 copies/mL. A negative result does not preclude SARS-Cov-2 infection and should not be used as the sole basis for treatment or other patient management  decisions. A negative result may occur with  improper specimen collection/handling, submission of specimen other than nasopharyngeal swab, presence of viral mutation(s) within the areas targeted by this assay, and inadequate number of viral copies(<138 copies/mL). A negative result must be combined with clinical observations, patient history, and epidemiological information. The expected result is Negative.  Fact Sheet for Patients:  EntrepreneurPulse.com.au  Fact Sheet for Healthcare Providers:  IncredibleEmployment.be  This test is no t yet approved or cleared by the [place] FDA and  has been authorized for detection and/or diagnosis of SARS-CoV-2 by FDA under an Emergency Use Authorization (EUA). This EUA will remain  in effect (meaning this test can be used) for the duration of the COVID-19 declaration under Section 564(b)(1) of the Act, 21 U.S.C.section 360bbb-3(b)(1), unless the authorization is terminated  or revoked sooner.       Influenza A by PCR NEGATIVE NEGATIVE Final   Influenza B by PCR NEGATIVE  NEGATIVE Final    Comment: (NOTE) The Xpert Xpress SARS-CoV-2/FLU/RSV plus assay is intended as an aid in the diagnosis of influenza from Nasopharyngeal swab specimens and should not be used as a sole basis for treatment. Nasal washings and aspirates are unacceptable for Xpert Xpress SARS-CoV-2/FLU/RSV testing.  Fact Sheet for Patients: EntrepreneurPulse.com.au  Fact Sheet for Healthcare Providers: IncredibleEmployment.be  This test is not yet approved or cleared by the [place] FDA and has been authorized for detection and/or diagnosis of SARS-CoV-2 by FDA under an Emergency Use Authorization (EUA). This EUA will remain in effect (meaning this test can be used) for the duration of the COVID-19 declaration under Section 564(b)(1) of the Act, 21 U.S.C. section 360bbb-3(b)(1), unless the authorization is terminated or revoked.  Performed at [facility], [address] [place], [place] [address]   MRSA Next Gen by PCR, Nasal     Status: None   Collection Time: 04/21/21  8:45 PM   Specimen: Nasal Mucosa; Nasal Swab  Result Value Ref Range Status   MRSA by PCR Next Gen NOT DETECTED NOT DETECTED Final    Comment: (NOTE) The GeneXpert MRSA Assay (FDA approved for NASAL specimens only), is one component of a comprehensive MRSA colonization surveillance program. It is not intended to diagnose MRSA infection nor to guide or monitor treatment for MRSA infections. Test performance is not FDA approved in patients less than 80 years old. Performed at [facility], [address], [place], [place] [address]          Radiology Studies: No results found.      Scheduled Meds:  aspirin EC  81 mg Oral QHS   atorvastatin  20 mg Oral QHS   buPROPion  150 mg Oral BID   chlorhexidine  15 mL Mouth Rinse BID   cholecalciferol  1,000 Units Oral QHS   enoxaparin (LOVENOX) injection  0.5 mg/kg Subcutaneous Q24H   gabapentin   100 mg Oral TID   levothyroxine  88 mcg Oral Daily   mouth rinse  15 mL Mouth Rinse q12n4p   midodrine  10 mg Oral TID WC   multivitamin-lutein  1 capsule Oral BID   patiromer  8.4 g Oral Daily   Ensure Max Protein  11 oz Oral BID   torsemide  40 mg Oral Daily   cyanocobalamin  1,000 mcg Oral Once per day on Sun Wed   Continuous Infusions:   LOS: 7 days    Time spent: 28 minutes    Sharen Hones, MD [facility]  To contact the attending provider between 7A-7P or the covering provider during after hours 7P-7A, please log into the web site [email or web address] and access using universal [place] password for that web site. If you do not have the password, please call the hospital operator.  04/28/2021, 11:30 AM

## 2021-04-29 LAB — BASIC METABOLIC PANEL
Anion gap: 10 (ref 5–15)
BUN: 77 mg/dL — ABNORMAL HIGH (ref 8–23)
CO2: 41 mmol/L — ABNORMAL HIGH (ref 22–32)
Calcium: 9.6 mg/dL (ref 8.9–10.3)
Chloride: 92 mmol/L — ABNORMAL LOW (ref 98–111)
Creatinine, Ser: 1.62 mg/dL — ABNORMAL HIGH (ref 0.61–1.24)
GFR, Estimated: 45 mL/min — ABNORMAL LOW (ref >60)
Glucose, Bld: 99 mg/dL (ref 70–99)
Potassium: 3.8 mmol/L (ref 3.5–5.1)
Sodium: 143 mmol/L (ref 135–145)

## 2021-04-29 LAB — SARS CORONAVIRUS 2 (TAT 6-24 HRS): SARS Coronavirus 2: NEGATIVE

## 2021-04-29 MED ORDER — TORSEMIDE 40 MG PO TABS: 40.0000 mg | ORAL_TABLET | Freq: Every day | ORAL | Status: DC

## 2021-04-29 MED ORDER — MIDODRINE HCL 10 MG PO TABS: 5.0000 mg | ORAL_TABLET | Freq: Three times a day (TID) | ORAL | Status: DC

## 2021-04-29 NOTE — TOC Transition Note (Signed)
Transition of Care [facility]) - CM/SW Discharge Note   Patient Details  Name: Christopher [name] MRN: [id number] Date of Birth: [birth year]  Transition of Care [facility]) CM/SW Contact:  Candie Chroman, LCSW Phone Number: 04/29/2021, 10:13 AM   Clinical Narrative:  Patient has orders to discharge to [facility] today. RN will call report to [phone]. EMS transport has been arranged for 11:00. Notified daughter that he is in his copay days but they will work out a payment plan once he discharges home. No further concerns. CSW signing off.  Final next level of care: Skilled Nursing Facility Barriers to Discharge: Barriers Resolved   Patient Goals and CMS Choice Patient states their goals for this hospitalization and ongoing recovery are:: wants him to go back to rehab to get stronger CMS Medicare.gov Compare Post Acute Care list provided to:: Patient Represenative (must comment) Choice offered to / list presented to : Patient, Adult Children  Discharge Placement   Existing PASRR number confirmed : 04/26/21          Patient chooses bed at: [facility] Patient to be transferred to facility by: EMS Name of family member notified: Cecil Cranker Patient and family notified of of transfer: 04/29/21  Discharge Plan and Services                                     Social Determinants of Health (SDOH) Interventions     Readmission Risk Interventions Readmission Risk Prevention Plan 04/26/2021  Transportation Screening Complete  PCP or Specialist Appt within 3-5 Days Complete  HRI or [place] Complete  Social Work Consult for [place] Planning/Counseling Complete  Palliative Care Screening Not Applicable  Medication Review [employer]) Complete

## 2021-04-29 NOTE — Discharge Summary (Signed)
Physician Discharge Summary  Patient ID: Christopher [name] MRN: [id number] DOB/AGE: 1951-04-09 70 y.o.  Admit date: 04/20/2021 Discharge date: 04/29/2021  Admission Diagnoses:  Discharge Diagnoses:  Active Problems:   Obstructive sleep apnea (adult) (pediatric)   Morbid obesity with BMI of 45.0-49.9, adult (HCC)   Acute on chronic diastolic CHF (congestive heart failure) (HCC)   Acute on chronic respiratory failure with hypoxia and hypercapnia (HCC)   Benign essential hypertension   Acute respiratory failure (HCC)   Acute renal failure superimposed on stage 3a chronic kidney disease ([place])   Pressure injury of skin   Discharged Condition: [place] Hospital Course:   Christopher [name] is a 70 y.o. male with medical history significant for diastolic CHF, depression, anxiety, hypothyroidism and sleep apnea, chronic respiratory failure on 2 L of O2 by nasal cannula, who presented to the emergency room with acute onset of altered mental status with associated dyspnea and hypoxia at his rehab facility.  He has a worsening hypoxemia, was placed on BiPAP.  He has a profound elevation of BNP, started on IV Lasix for diastolic congestive heart failure.   Discussed with daughter, patient has had has been deteriorating since January after he had ankle injury.  He has not been able to walk.  He had increased confusion and hallucinations since April.  He has sleep apnea, not compliant with CPAP.     Patient is treated with IV Lasix, IV steroids.  Condition had improved.  Currently on torsemide 40 mg daily.  Pending SNF placement.  Acute on chronic respiratory failure with hypoxemia and hypercapnia. Acute on chronic diastolic congestive heart failure. Morbid obesity  Obesity hypoventilation syndrome. Obstructive sleep apnea. Hypotension. Patient condition had improved, currently back to baseline with 2 L oxygen and wears CPAP at nighttime. Currently on midodrine for hypotension.  Blood pressures seem to  be better, I will reduce midodrine to 5 mg 3 times a day.      Acute kidney injury on chronic kidney disease stage IIIa secondary to cardiorenal syndrome. Hyperkalemia. Condition has improved.  Patient was treated with Veltassa.  Potassium has been normalized.  Hyperkalemia was caused by acute kidney injury in addition to taking potassium supplements.  Potassium oral supplements was discontinued.   Vitamin B12 deficient anemia  Continue oral supplement.  Failure to thrive. Delirium. Condition has improved.       pressure ulcers: POA. Follow with RN Pressure Injury 04/21/21 Buttocks Right;Left Stage 2 -  Partial thickness loss of dermis presenting as a shallow open injury with a red, pink wound bed without slough. (Active)  04/21/21 2040  Location: Buttocks  Location Orientation: Right;Left  Staging: Stage 2 -  Partial thickness loss of dermis presenting as a shallow open injury with a red, pink wound bed without slough.  Wound Description (Comments):   Present on Admission: Yes     Pressure Injury 04/21/21 Thigh Left;Posterior Stage 2 -  Partial thickness loss of dermis presenting as a shallow open injury with a red, pink wound bed without slough. (Active)  04/21/21 2040  Location: Thigh  Location Orientation: Left;Posterior  Staging: Stage 2 -  Partial thickness loss of dermis presenting as a shallow open injury with a red, pink wound bed without slough.  Wound Description (Comments):   Present on Admission: Yes     Consults: cardiology and nephrology  Significant Diagnostic Studies:  Echo: Left ventricular ejection fraction, by estimation, is 60 to 65%. The left ventricle has normal function. The left ventricle has no  regional wall motion abnormalities. Left ventricular diastolic parameters are indeterminate. 1. 2. Right ventricular systolic function is normal. The right ventricular size is normal. The mitral valve is normal in structure. Mild mitral valve  regurgitation. No evidence of mitral stenosis. 3. The aortic valve is normal in structure. Aortic valve regurgitation is not visualized. No aortic stenosis is present. 4. The inferior vena cava is normal in size with greater than 50% respiratory variability, suggesting right atrial pressure of 3 mmHg.  Treatments: IV lasix  Discharge Exam: Blood pressure 101/64, pulse 75, temperature 98 F (36.7 C), temperature source Oral, resp. rate 20, height '5\' 11"'$  (1.803 m), weight (!) 156.8 kg, SpO2 98 %. General appearance: alert and cooperative Resp: clear to auscultation bilaterally Cardio: regular rate and rhythm, S1, S2 normal, no murmur, click, rub or gallop GI: soft, non-tender; bowel sounds normal; no masses,  no organomegaly Extremities: Chronic lymphedema with discoloration, edema much improved  Disposition: Discharge disposition: 03-Skilled Nursing Facility       Discharge Instructions     Diet - low sodium heart healthy   Complete by: As directed    Discharge wound care:   Complete by: As directed    Follow by RN   Increase activity slowly   Complete by: As directed       Allergies as of 04/29/2021       Reactions   Albuterol Other (See Comments)   Causes afib    Gramineae Pollens Other (See Comments)   Sneeze and watery eyes Sneeze and watery eyes   Other Other (See Comments)   Other reaction(s): Other (See Comments) Unable to breath when around cats Other reaction(s): Other (See Comments) Unable to breath when around cats        Medication List     STOP taking these medications    furosemide 40 MG tablet Commonly known as: LASIX   ibuprofen 200 MG tablet Commonly known as: ADVIL   potassium chloride 10 MEQ tablet Commonly known as: KLOR-CON   propranolol 40 MG tablet Commonly known as: INDERAL       TAKE these medications    aspirin 81 MG EC tablet Take 81 mg by mouth at bedtime.   atorvastatin 20 MG tablet Commonly known as:  LIPITOR Take 20 mg by mouth at bedtime.   buPROPion 150 MG 12 hr tablet Commonly known as: WELLBUTRIN SR Take 150 mg by mouth 2 (two) times daily. After 1st meal of day and after dinner   Cholecalciferol 25 MCG (1000 UT) capsule Take 1,000 Units by mouth at bedtime.   cyanocobalamin 1000 MCG tablet Take 1,000 mcg by mouth 2 (two) times a week. At bedtime. Sundays and Wednesdays   Ensure Max Protein Liqd Take 330 mLs (11 oz total) by mouth 2 (two) times daily.   gabapentin 300 MG capsule Commonly known as: NEURONTIN Take 300 mg by mouth 3 (three) times daily. After 1st meal of day, after dinner, and at bedtime   levalbuterol 0.63 MG/3ML nebulizer solution Commonly known as: XOPENEX Take 3 mLs (0.63 mg total) by nebulization every 6 (six) hours as needed for wheezing or shortness of breath.   levalbuterol 45 MCG/ACT inhaler Commonly known as: XOPENEX HFA Inhale 2 puffs into the lungs every 4 (four) hours as needed for wheezing.   levothyroxine 88 MCG tablet Commonly known as: SYNTHROID Take 88 mcg by mouth daily. 30 to 60 minutes before breakfast on an empty stomach and with a glass of water   midodrine  10 MG tablet Commonly known as: PROAMATINE Take 0.5 tablets (5 mg total) by mouth 3 (three) times daily with meals.   mupirocin ointment 2 % Commonly known as: BACTROBAN Apply topically daily.   OXYGEN Inhale 2 L into the lungs continuous.   PreserVision AREDS 2 Caps Take 1 capsule by mouth 2 (two) times daily. With food. After 1st meal of day and after dinner   Torsemide 40 MG Tabs Take 40 mg by mouth daily.   trolamine salicylate 10 % cream Commonly known as: ASPERCREME Apply 1 application topically as needed for muscle pain. Apply to knees and feet   vitamin A & D ointment Apply 1 application topically as needed. To groin area.               Discharge Care Instructions  (From admission, onward)           Start     Ordered   04/29/21 0000   Discharge wound care:       Comments: Follow by RN   04/29/21 [id number]            Contact information for follow-up providers     Paraschos, Alexander, MD Follow up in 1 week(s).   Specialty: Cardiology Contact information: [facility] [place] [address] [phone]              Contact information for after-discharge care     [facility] .   Service: Skilled Nursing Contact information: [place] [address] [phone]                    35 minutes Signed: Sharen Hones 04/29/2021, 9:15 AM

## 2021-04-29 NOTE — Progress Notes (Signed)
Report called to Delos Haring, Therapist, sports at [facility].

## 2021-04-30 ENCOUNTER — Ambulatory Visit: Payer: Medicare Other | Admitting: Family

## 2021-05-05 NOTE — Progress Notes (Deleted)
   Patient ID: Christopher [name], male    DOB: 10-26-1950, 70 y.o.   MRN: [id number]  HPI  Christopher [name] is a 70 y/o male with a history   Echo report from 04/21/21 reviewed and showed and EF of 60-65% along with mild Christopher.   Admitted 04/20/21 due to AMS and shortness of breath. Placed on bipap and given IV diuretics & IV steroids. Cardiology and nephrology consults obtained. Meds transitioned to oral route. Placed on midodrine for hypotension. Given veltassa for hyperkalemia. Discharged after 9 days.   He presents today for his initial visit with a chief complaint of   Review of Systems    Physical Exam    Assessment & Plan:  1: Chronic heart failure with preserved ejection fraction without structural changes- - NYHA class  2: HTN- - BP  3: Obstructive sleep apnea-

## 2021-05-07 ENCOUNTER — Ambulatory Visit: Payer: Medicare Other | Admitting: Family

## 2021-05-15 ENCOUNTER — Encounter: Payer: Self-pay | Admitting: Family

## 2021-05-15 ENCOUNTER — Other Ambulatory Visit: Payer: Self-pay

## 2021-05-15 ENCOUNTER — Ambulatory Visit: Payer: Medicare Other | Attending: Family | Admitting: Family

## 2021-05-15 VITALS — BP 115/54 | HR 75 | Resp 16 | Ht 71.0 in

## 2021-05-15 DIAGNOSIS — G4733 Obstructive sleep apnea (adult) (pediatric): Secondary | ICD-10-CM | POA: Diagnosis not present

## 2021-05-15 DIAGNOSIS — F32A Depression, unspecified: Secondary | ICD-10-CM | POA: Diagnosis not present

## 2021-05-15 DIAGNOSIS — J449 Chronic obstructive pulmonary disease, unspecified: Secondary | ICD-10-CM | POA: Diagnosis not present

## 2021-05-15 DIAGNOSIS — E785 Hyperlipidemia, unspecified: Secondary | ICD-10-CM | POA: Insufficient documentation

## 2021-05-15 DIAGNOSIS — M25561 Pain in right knee: Secondary | ICD-10-CM | POA: Insufficient documentation

## 2021-05-15 DIAGNOSIS — I10 Essential (primary) hypertension: Secondary | ICD-10-CM

## 2021-05-15 DIAGNOSIS — Z79899 Other long term (current) drug therapy: Secondary | ICD-10-CM | POA: Insufficient documentation

## 2021-05-15 DIAGNOSIS — R0602 Shortness of breath: Secondary | ICD-10-CM | POA: Insufficient documentation

## 2021-05-15 DIAGNOSIS — M25562 Pain in left knee: Secondary | ICD-10-CM | POA: Diagnosis not present

## 2021-05-15 DIAGNOSIS — I110 Hypertensive heart disease with heart failure: Secondary | ICD-10-CM | POA: Diagnosis not present

## 2021-05-15 DIAGNOSIS — Z8249 Family history of ischemic heart disease and other diseases of the circulatory system: Secondary | ICD-10-CM | POA: Diagnosis not present

## 2021-05-15 DIAGNOSIS — Z7982 Long term (current) use of aspirin: Secondary | ICD-10-CM | POA: Diagnosis not present

## 2021-05-15 DIAGNOSIS — I5032 Chronic diastolic (congestive) heart failure: Secondary | ICD-10-CM | POA: Diagnosis not present

## 2021-05-15 DIAGNOSIS — R42 Dizziness and giddiness: Secondary | ICD-10-CM | POA: Diagnosis not present

## 2021-05-15 DIAGNOSIS — F419 Anxiety disorder, unspecified: Secondary | ICD-10-CM | POA: Diagnosis not present

## 2021-05-15 DIAGNOSIS — R5383 Other fatigue: Secondary | ICD-10-CM | POA: Insufficient documentation

## 2021-05-15 NOTE — Patient Instructions (Signed)
Continue weighing daily and call for an overnight weight gain of > 2 pounds or a weekly weight gain of >5 pounds. 

## 2021-05-15 NOTE — Progress Notes (Signed)
Patient ID: Christopher [name], male    DOB: 1951-01-08, 70 y.o.   MRN: [id number]  HPI  Christopher [name] is a 70 y/o male with a history of HTN, thyroid disease, anxiety, COPD, depression, hyperlipidemia, sleep apnea and chronic heart failure.   Echo report from 04/21/21 reviewed and showed and EF of 60-65% along with mild Christopher.   Admitted 04/20/21 due to AMS and shortness of breath. Placed on bipap and given IV diuretics & IV steroids. Cardiology and nephrology consults obtained. Meds transitioned to oral route. Placed on midodrine for hypotension. Given veltassa for hyperkalemia. Discharged after 9 days.   He presents today for his initial visit with a chief complaint of minimal fatigue with moderate exertion. He describes this as having been present for several years. He has associated shortness of breath, bilateral knee pain and light-headedness with sudden position changes. He denies any difficulty sleeping, abdominal distention, palpitations, pedal edema, chest pain or cough.   Says that he's currently at [facility] and says that he feels like he's ready and capable of going home. Daughter expresses concern about his ability to safely maneuver at home. He says that he's participating in PT "without any issues" but he wasn't able to stand from his wheelchair to our scale today. He says it's because he couldn''t get his legs under him good because of the wheelchair and he's so concerned about falling that it makes him nervous.   Past Medical History:  Diagnosis Date   Anxiety    CHF (congestive heart failure) (HCC)    COPD (chronic obstructive pulmonary disease) ([place])    Depression    Dysrhythmia    Hyperlipidemia    Hypertension    Hypothyroidism    Sleep apnea    Past Surgical History:  Procedure Laterality Date   FRACTURE SURGERY     ORIF ANKLE FRACTURE Left 12/17/2020   Procedure: OPEN REDUCTION INTERNAL FIXATION (ORIF) ANKLE FRACTURE;  Surgeon: Samara Deist, DPM;  Location: [facility] ORS;   Service: Podiatry;  Laterality: Left;   Family History  Problem Relation Age of Onset   Hypertension Mother    Social History   Tobacco Use   Smoking status: Never   Smokeless tobacco: Never  Substance Use Topics   Alcohol use: Not Currently   Allergies  Allergen Reactions   Albuterol Other (See Comments)    Causes afib    Gramineae Pollens Other (See Comments)    Sneeze and watery eyes Sneeze and watery eyes   Other Other (See Comments)    Other reaction(s): Other (See Comments) Unable to breath when around cats Other reaction(s): Other (See Comments) Unable to breath when around cats   Prior to Admission medications   Medication Sig Start Date End Date Taking? Authorizing Provider  aspirin 81 MG EC tablet Take 81 mg by mouth at bedtime.   Yes [provider]  atorvastatin (LIPITOR) 20 MG tablet Take 20 mg by mouth at bedtime. 10/06/20  Yes [provider]  buPROPion (WELLBUTRIN SR) 150 MG 12 hr tablet Take 150 mg by mouth 2 (two) times daily. After 1st meal of day and after dinner 12/02/20  Yes [provider]  Cholecalciferol 25 MCG (1000 UT) capsule Take 1,000 Units by mouth at bedtime.   Yes [provider]  cyanocobalamin 1000 MCG tablet Take 1,000 mcg by mouth 2 (two) times a week. At bedtime. Sundays and Wednesdays   Yes [provider]  Ensure Max Protein (ENSURE MAX PROTEIN) LIQD Take 330  mLs (11 oz total) by mouth 2 (two) times daily. 03/19/21  Yes Lorella Nimrod, MD  gabapentin (NEURONTIN) 300 MG capsule Take 300 mg by mouth 3 (three) times daily. After 1st meal of day, after dinner, and at bedtime 04/07/20  Yes [provider]  levalbuterol (XOPENEX HFA) 45 MCG/ACT inhaler Inhale 2 puffs into the lungs every 4 (four) hours as needed for wheezing.   Yes [provider]  levothyroxine (SYNTHROID) 88 MCG tablet Take 88 mcg by mouth daily. 30 to 60 minutes before breakfast on an empty stomach and with a glass of  water 12/02/20  Yes [provider]  midodrine (PROAMATINE) 10 MG tablet Take 0.5 tablets (5 mg total) by mouth 3 (three) times daily with meals. 04/29/21  Yes Sharen Hones, MD  Multiple Vitamins-Minerals (PRESERVISION AREDS 2) CAPS Take 1 capsule by mouth 2 (two) times daily. With food. After 1st meal of day and after dinner   Yes [provider]  mupirocin ointment (BACTROBAN) 2 % Apply topically daily. 03/19/21  Yes Lorella Nimrod, MD  OXYGEN Inhale 2 L into the lungs continuous.   Yes [provider]  torsemide 40 MG TABS Take 40 mg by mouth daily. 04/29/21  Yes Sharen Hones, MD  trolamine salicylate (ASPERCREME) 10 % cream Apply 1 application topically as needed for muscle pain. Apply to knees and feet   Yes [provider]  Vitamins A & D (VITAMIN A & D) ointment Apply 1 application topically as needed. To groin area.   Yes [provider]  levalbuterol (XOPENEX) 0.63 MG/3ML nebulizer solution Take 3 mLs (0.63 mg total) by nebulization every 6 (six) hours as needed for wheezing or shortness of breath. Patient not taking: Reported on 05/15/2021 03/24/21   Fritzi Mandes, MD   Review of Systems  Constitutional:  Positive for fatigue. Negative for appetite change.  HENT:  Negative for congestion, postnasal drip and sore throat.   Eyes: Negative.   Respiratory:  Positive for shortness of breath. Negative for cough and chest tightness.   Cardiovascular:  Negative for chest pain, palpitations and leg swelling.  Gastrointestinal:  Negative for abdominal distention and abdominal pain.  Endocrine: Negative.   Genitourinary: Negative.   Musculoskeletal:  Positive for arthralgias (both knees hurt). Negative for back pain and neck pain.  Skin: Negative.   Allergic/Immunologic: Negative.   Neurological:  Positive for light-headedness (with sudden position changes). Negative for dizziness.  Hematological:  Negative for adenopathy. Does not bruise/bleed easily.   Psychiatric/Behavioral:  Negative for dysphoric mood and sleep disturbance (sleeping on 1 pillow; wearing CPAP nightly). The patient is not nervous/anxious.    Vitals:   05/15/21 1035  BP: (!) 115/54  Pulse: 75  Resp: 16  SpO2: 100%  Height: '5\' 11"'$  (1.803 m)   Wt Readings from Last 3 Encounters:  04/29/21 (!) 345 lb 10.9 oz (156.8 kg)  04/06/21 (!) 369 lb (167.4 kg)  03/30/21 (!) 365 lb 15.4 oz (166 kg)   Lab Results  Component Value Date   CREATININE 1.62 (H) 04/29/2021   CREATININE 1.48 (H) 04/26/2021   CREATININE 1.67 (H) 04/24/2021   Physical Exam Vitals and nursing note reviewed. Exam conducted with a chaperone present (daughter).  Constitutional:      Appearance: Normal appearance.  HENT:     Head: Normocephalic and atraumatic.  Cardiovascular:     Rate and Rhythm: Normal rate and regular rhythm.  Pulmonary:     Effort: Pulmonary effort is normal. No respiratory distress.  Breath sounds: No wheezing or rales.  Abdominal:     General: There is no distension.     Palpations: Abdomen is soft.  Musculoskeletal:        General: No tenderness.     Cervical back: Normal range of motion and neck supple.     Right lower leg: No edema.     Left lower leg: No edema.  Skin:    General: Skin is warm and dry.  Neurological:     General: No focal deficit present.     Mental Status: He is alert and oriented to person, place, and time.  Psychiatric:        Mood and Affect: Mood normal.        Behavior: Behavior normal.        Thought Content: Thought content normal.    Assessment & Plan:  1: Chronic heart failure with preserved ejection fraction without structural changes- - NYHA class II - euvolemic today - order written for patient to be weighed daily at [facility] and to call for an overnight weight gain of >2 pounds or a weekly weight gain of > 5 pounds - attempted to weigh patient but he was extremely anxious about doing this and kept trying to pull himself up by  holding on the scale causing the scale to tip so we did not get to weigh him today - participating in PT at facility and, most likely, will need home PT to evaluate safety - not adding salt to his food; encouraged him to continue this once he returns home - saw cardiology (Paras[place])during recent admission - drinking ~ 1/2 gallon of fluids daily - BNP 04/20/21 was 1577.7  2: HTN- - BP looks good today - saw PCP Sabra Heck) 03/17/21 - BMP 04/29/21 reviewed and showed sodium 143, potassium 3.8, creatinine 1.62 and GFR 45  3: Obstructive sleep apnea- - saw pulmonology Lanney Gins) 09/30/20 - wearing CPAP nightly - wearing oxygen at 2L around the clock   Patient did not bring his medications nor a list. Each medication was verbally reviewed with the patient and he was encouraged to bring the bottles to every visit to confirm accuracy of list.   Return in 6 weeks or sooner for any questions/problems before then.

## 2021-05-19 ENCOUNTER — Emergency Department: Payer: Medicare Other

## 2021-05-19 ENCOUNTER — Emergency Department
Admission: EM | Admit: 2021-05-19 | Discharge: 2021-05-19 | Disposition: A | Discharge status: Home / Self Care | Payer: Medicare Other | Source: Home / Self Care | Attending: Emergency Medicine | Admitting: Emergency Medicine

## 2021-05-19 ENCOUNTER — Other Ambulatory Visit: Payer: Self-pay

## 2021-05-19 DIAGNOSIS — S022XXA Fracture of nasal bones, initial encounter for closed fracture: Secondary | ICD-10-CM | POA: Insufficient documentation

## 2021-05-19 DIAGNOSIS — R55 Syncope and collapse: Secondary | ICD-10-CM | POA: Diagnosis not present

## 2021-05-19 DIAGNOSIS — R600 Localized edema: Secondary | ICD-10-CM | POA: Insufficient documentation

## 2021-05-19 DIAGNOSIS — W01198A Fall on same level from slipping, tripping and stumbling with subsequent striking against other object, initial encounter: Secondary | ICD-10-CM | POA: Insufficient documentation

## 2021-05-19 DIAGNOSIS — N1831 Chronic kidney disease, stage 3a: Secondary | ICD-10-CM | POA: Insufficient documentation

## 2021-05-19 DIAGNOSIS — W19XXXA Unspecified fall, initial encounter: Secondary | ICD-10-CM

## 2021-05-19 DIAGNOSIS — J449 Chronic obstructive pulmonary disease, unspecified: Secondary | ICD-10-CM | POA: Insufficient documentation

## 2021-05-19 DIAGNOSIS — Z7982 Long term (current) use of aspirin: Secondary | ICD-10-CM | POA: Insufficient documentation

## 2021-05-19 DIAGNOSIS — E039 Hypothyroidism, unspecified: Secondary | ICD-10-CM | POA: Insufficient documentation

## 2021-05-19 DIAGNOSIS — I5033 Acute on chronic diastolic (congestive) heart failure: Secondary | ICD-10-CM | POA: Insufficient documentation

## 2021-05-19 DIAGNOSIS — Z79899 Other long term (current) drug therapy: Secondary | ICD-10-CM | POA: Insufficient documentation

## 2021-05-19 DIAGNOSIS — S0083XA Contusion of other part of head, initial encounter: Secondary | ICD-10-CM

## 2021-05-19 DIAGNOSIS — I130 Hypertensive heart and chronic kidney disease with heart failure and stage 1 through stage 4 chronic kidney disease, or unspecified chronic kidney disease: Secondary | ICD-10-CM | POA: Insufficient documentation

## 2021-05-19 MED ORDER — IBUPROFEN 400 MG PO TABS
400.0000 mg | ORAL_TABLET | Freq: Once | ORAL | Status: AC
  Administered 2021-05-19: 400 mg via ORAL
  Filled 2021-05-19: qty 1

## 2021-05-19 NOTE — ED Triage Notes (Signed)
PT here with a fall today. Pt hit his head but denies any other complaints.

## 2021-05-19 NOTE — Discharge Instructions (Signed)
Your CT face today showed: IMPRESSION:  1. Small chip fracture at the tip of the nasal bone best seen on  sagittal reconstructed images.  2. Soft tissue swelling of the nasal bridge and left frontal scalp.

## 2021-05-19 NOTE — ED Provider Notes (Signed)
[facility] Emergency Department Provider Note  ____________________________________________   Event Date/Time   First MD Initiated Contact with Patient 05/19/21 1420     (approximate)  I have reviewed the triage vital signs and the nursing notes.   HISTORY  Chief Complaint Fall   HPI Christopher [name] is a 70 y.o. male  with medical history significant for diastolic CHF, depression, anxiety, hypothyroidism and sleep apnea, chronic respiratory failure on 2 L of O2 by nasal cannula currently residing at [facility] after recent left ankle fracture several months ago who presents for assessment after a fallthat occurred earlier today.  Per patient and daughter patient was working with physical therapy practicing walking with his walker and fell forward hitting his face.  Patient states he does not remember exactly what happened with her daughter physical therapist told her that patient's left leg is giving out on him.  Patient states he has little bit of a headache and facial pain but no other acute pain including in his chest, abdomen, back or extremities aside from the chronic pain in his right knee from some arthritis in his left ankle which is still quite back to normal.  He denies any recent other falls or sick symptoms including fevers, chills, cough, nausea, vomiting, diarrhea, dysuria, rash or any other acute concerns at this time.         Past Medical History:  Diagnosis Date   Anxiety    CHF (congestive heart failure) (HCC)    COPD (chronic obstructive pulmonary disease) ([place])    Depression    Dysrhythmia    Hyperlipidemia    Hypertension    Hypothyroidism    Sleep apnea     Patient Active Problem List   Diagnosis Date Noted   Pressure injury of skin 04/25/2021   Acute respiratory failure ([place]) 04/21/2021   Acute renal failure superimposed on stage 3a chronic kidney disease ([place]) 04/21/2021   Acute on chronic respiratory failure with hypoxia and  hypercapnia (HCC) Q000111Q   Acute metabolic encephalopathy Q000111Q   CKD (chronic kidney disease), stage IIIa 03/22/2021   Leukocytosis 03/22/2021   Elevated troponin 03/22/2021   Open wound of left foot 03/22/2021   Osteomyelitis of ankle or foot, acute, left ([place]) 03/17/2021   Acute on chronic respiratory failure ([place]) 03/17/2021   Osteomyelitis ([place]) 12/16/2020   Closed left ankle fracture 12/16/2020   Depression 12/16/2020   Medicare annual wellness visit, initial 11/14/2019   B12 deficiency 11/14/2019   Hyperlipidemia, mixed 10/31/2019   Primary osteoarthritis of right knee 03/16/2019   Obstructive sleep apnea (adult) (pediatric) 10/31/2018   Traumatic incomplete tear of left rotator cuff 10/02/2018   Morbid obesity with BMI of 45.0-49.9, adult ([place]) 09/20/2018   Acquired hypothyroidism 09/20/2018   Acute on chronic diastolic CHF (congestive heart failure) ([place]) 09/20/2018   Benign essential hypertension 09/20/2018    Past Surgical History:  Procedure Laterality Date   FRACTURE SURGERY     ORIF ANKLE FRACTURE Left 12/17/2020   Procedure: OPEN REDUCTION INTERNAL FIXATION (ORIF) ANKLE FRACTURE;  Surgeon: Samara Deist, DPM;  Location: [facility] ORS;  Service: Podiatry;  Laterality: Left;    Prior to Admission medications   Medication Sig Start Date End Date Taking? Authorizing Provider  aspirin 81 MG EC tablet Take 81 mg by mouth at bedtime.    [provider]  atorvastatin (LIPITOR) 20 MG tablet Take 20 mg by mouth at bedtime. 10/06/20   [provider]  buPROPion (WELLBUTRIN SR) 150 MG  12 hr tablet Take 150 mg by mouth 2 (two) times daily. After 1st meal of day and after dinner 12/02/20   [provider]  Cholecalciferol 25 MCG (1000 UT) capsule Take 1,000 Units by mouth at bedtime.    [provider]  cyanocobalamin 1000 MCG tablet Take 1,000 mcg by mouth 2 (two) times a week. At bedtime. Sundays and Wednesdays    [provider]   Ensure Max Protein (ENSURE MAX PROTEIN) LIQD Take 330 mLs (11 oz total) by mouth 2 (two) times daily. 03/19/21   Lorella Nimrod, MD  gabapentin (NEURONTIN) 300 MG capsule Take 300 mg by mouth 3 (three) times daily. After 1st meal of day, after dinner, and at bedtime 04/07/20   [provider]  levalbuterol (XOPENEX HFA) 45 MCG/ACT inhaler Inhale 2 puffs into the lungs every 4 (four) hours as needed for wheezing.    [provider]  levalbuterol Penne Lash) 0.63 MG/3ML nebulizer solution Take 3 mLs (0.63 mg total) by nebulization every 6 (six) hours as needed for wheezing or shortness of breath. Patient not taking: Reported on 05/15/2021 03/24/21   Fritzi Mandes, MD  levothyroxine (SYNTHROID) 88 MCG tablet Take 88 mcg by mouth daily. 30 to 60 minutes before breakfast on an empty stomach and with a glass of water 12/02/20   [provider]  midodrine (PROAMATINE) 10 MG tablet Take 0.5 tablets (5 mg total) by mouth 3 (three) times daily with meals. 04/29/21   Sharen Hones, MD  Multiple Vitamins-Minerals (PRESERVISION AREDS 2) CAPS Take 1 capsule by mouth 2 (two) times daily. With food. After 1st meal of day and after dinner    [provider]  mupirocin ointment (BACTROBAN) 2 % Apply topically daily. 03/19/21   Lorella Nimrod, MD  OXYGEN Inhale 2 L into the lungs continuous.    [provider]  torsemide 40 MG TABS Take 40 mg by mouth daily. 04/29/21   Sharen Hones, MD  trolamine salicylate (ASPERCREME) 10 % cream Apply 1 application topically as needed for muscle pain. Apply to knees and feet    [provider]  Vitamins A & D (VITAMIN A & D) ointment Apply 1 application topically as needed. To groin area.    [provider]    Allergies Albuterol, Gramineae pollens, and Other  Family History  Problem Relation Age of Onset   Hypertension Mother     Social History Social History   Tobacco Use   Smoking status: Never   Smokeless tobacco: Never   Vaping Use   Vaping Use: Never used  Substance Use Topics   Alcohol use: Not Currently   Drug use: Never    Review of Systems  Review of Systems  Constitutional:  Negative for chills and fever.  HENT:  Negative for sore throat.   Eyes:  Negative for pain.  Respiratory:  Negative for cough and stridor.   Cardiovascular:  Negative for chest pain.  Gastrointestinal:  Negative for vomiting.  Genitourinary:  Negative for dysuria.  Musculoskeletal:  Positive for joint pain (chronic in R knee, L ankle).  Skin:  Negative for rash.  Neurological:  Positive for headaches. Negative for seizures and loss of consciousness.  Psychiatric/Behavioral:  Negative for suicidal ideas.   All other systems reviewed and are negative.    ____________________________________________   PHYSICAL EXAM:  VITAL SIGNS: ED Triage Vitals  Enc Vitals Group     BP 05/19/21 1229 112/73     Pulse Rate 05/19/21 1229 84  Resp 05/19/21 1229 20     Temp 05/19/21 1229 98.3 F (36.8 C)     Temp Source 05/19/21 1229 Oral     SpO2 05/19/21 1229 94 %     Weight 05/19/21 1247 (!) 335 lb (152 kg)     Height 05/19/21 1247 '6\' 1"'$  (1.854 m)     Head Circumference --      Peak Flow --      Pain Score 05/19/21 1247 1     Pain Loc --      Pain Edu? --      Excl. in [place]? --    Vitals:   05/19/21 1229  BP: 112/73  Pulse: 84  Resp: 20  Temp: 98.3 F (36.8 C)  SpO2: 94%   Physical Exam Vitals and nursing note reviewed.  Constitutional:      Appearance: He is well-developed. He is obese.  HENT:     Head: Normocephalic and atraumatic.     Right Ear: External ear normal.     Left Ear: External ear normal.     Nose: Nose normal.  Eyes:     Conjunctiva/sclera: Conjunctivae normal.  Cardiovascular:     Rate and Rhythm: Normal rate and regular rhythm.     Heart sounds: No murmur heard. Pulmonary:     Effort: Pulmonary effort is normal. No respiratory distress.     Breath sounds: Normal breath sounds.   Abdominal:     Palpations: Abdomen is soft.     Tenderness: There is no abdominal tenderness.  Musculoskeletal:     Cervical back: Neck supple.     Right lower leg: Edema present.     Left lower leg: Edema present.  Skin:    General: Skin is warm and dry.  Neurological:     Mental Status: He is alert and oriented to person, place, and time.    There is ecchymosis over the forehead and nasal arch.  There is some tenderness here as well.  There is no septal hematoma.  Cranial nerves II through XII are otherwise grossly intact.  No other trauma to the face scalp head or neck.  There is no tenderness step-offs deformities over the C/C/L-spine.  Patient has full strength of his bilateral upper extremities and bilateral hips.  Is a little weak in the left ankle which he states is baseline slightly weaker in the right knee which he states is also baseline from his chronic arthritis.  Sensation is intact light touch all extremities.  Lower extremities are edematous but appear warm and well-perfused.  There is no tenderness about either ankle or knee.  No other obvious trauma to the face, head neck chest abdomen or back on exam. ____________________________________________   LABS (all labs ordered are listed, but only abnormal results are displayed)  Labs Reviewed - No data to display ____________________________________________  EKG  ____________________________________________  RADIOLOGY  ED MD interpretation: CT head, face and C-spine showed no evidence of skull fracture, intracranial hemorrhage or acute C-spine injury but there is a very small distal nasal arch fracture.  No other acute facial fractures.  There is evidence of soft tissue injury of the face.  Small vessel chronic ischemic changes noted.  Official radiology report(s): CT HEAD WO CONTRAST (5MM)  Result Date: 05/19/2021 CLINICAL DATA:  Fall.  Facial trauma.  Forehead hematoma EXAM: CT HEAD WITHOUT CONTRAST TECHNIQUE:  Contiguous axial images were obtained from the base of the skull through the vertex without intravenous contrast. COMPARISON:  04/21/2021  FINDINGS: Brain: Moderate low density in the periventricular white matter likely related to small vessel disease. No mass lesion, hemorrhage, hydrocephalus, acute infarct, intra-axial, or extra-axial fluid collection. Vascular: No hyperdense vessel or unexpected calcification. Skull: Left paramidline frontal scalp soft tissue swelling including on 28/3. No underlying skull fracture. Sinuses/Orbits: Normal imaged portions of the orbits and globes. Mucous retention cyst or polyp in the left sphenoid sinus. Clear mastoid air cells. Other: None. IMPRESSION: 1. Left frontal scalp soft tissue swelling, without acute intracranial abnormality. 2. Small vessel ischemic change. Electronically Signed   By: Abigail Miyamoto M.D.   On: 05/19/2021 14:18   CT Cervical Spine Wo Contrast  Result Date: 05/19/2021 CLINICAL DATA:  [place], facial laceration and swelling EXAM: CT CERVICAL SPINE WITHOUT CONTRAST TECHNIQUE: Multidetector CT imaging of the cervical spine was performed without intravenous contrast. Multiplanar CT image reconstructions were also generated. COMPARISON:  None. FINDINGS: Alignment: Alignment is anatomic. Skull base and vertebrae: No acute fracture. No primary bone lesion or focal pathologic process. Soft tissues and spinal canal: No prevertebral fluid or swelling. No visible canal hematoma. Disc levels: There is marked spondylosis from C4-5 through C7-T1. Central disc osteophyte complex results in at least mild central canal stenosis at C5-6 and C6-7. Upper chest: Central airway is patent.  Lung apices are clear. Other: Reconstructed images demonstrate no additional findings. IMPRESSION: 1. No acute cervical spine fracture. 2. Extensive multilevel spondylosis as above. Electronically Signed   By: Randa Ngo M.D.   On: 05/19/2021 16:09   CT Maxillofacial Wo  Contrast  Result Date: 05/19/2021 CLINICAL DATA:  Facial trauma, nasal laceration, scalp hematoma EXAM: CT MAXILLOFACIAL WITHOUT CONTRAST TECHNIQUE: Multidetector CT imaging of the maxillofacial structures was performed. Multiplanar CT image reconstructions were also generated. COMPARISON:  None. FINDINGS: Osseous: Small chip fracture at the tip of the nasal bone. No other acute facial bone fractures. Orbits: Negative. No traumatic or inflammatory finding. Sinuses: Clear. Soft tissues: Soft tissue swelling in the left supraorbital region and left frontal scalp. Minimal swelling along the nasal bridge. Remaining soft tissues are unremarkable. Limited intracranial: No significant or unexpected finding. IMPRESSION: 1. Small chip fracture at the tip of the nasal bone best seen on sagittal reconstructed images. 2. Soft tissue swelling of the nasal bridge and left frontal scalp. Electronically Signed   By: Randa Ngo M.D.   On: 05/19/2021 16:07    ____________________________________________   PROCEDURES  Procedure(s) performed (including Critical Care):  Procedures   ____________________________________________   INITIAL IMPRESSION / ASSESSMENT AND PLAN / ED COURSE      Patient presents with above-stated history exam for assessment after chemical fall described above.  Patient states does remember this LOC and states his head is denying any other recent sick symptoms.  Seem this was throughout the sudden with patient's leg giving out from under him although he has been able to bear weight using his walker since then and does not seem to have any new focal deficits on exam.  He is otherwise neurovascularly intact in his extremities and other than ecchymosis and bruising on his face is no evidence of evidence of trauma on exam.  He is on his chronic 2 L nasal cannula and is not in any respiratory distress.  I have a low suspicion for significant acute metabolic derangement or acute infectious  process at this time given patient denying any associated or preceding symptoms or any other acute complaints currently.  Given his age and reported fall onto face CT head  face and C-spine obtained.  CT head, face and C-spine showed no evidence of skull fracture, intracranial hemorrhage or acute C-spine injury but there is a very small distal nasal arch fracture.  No other acute facial fractures.  There is evidence of soft tissue injury of the face.  Small vessel chronic ischemic changes noted.  This fracture is distal to there is abrasion below suspicion for open fracture.  No septal hematoma.  Impression is soft tissue injuries only at this time and lower suspicion for significant visceral injury.  He is stable for discharge back to nursing facility with outpatient follow-up.  Discharged stable condition.  Strict precautions advised and discussed.      ____________________________________________   FINAL CLINICAL IMPRESSION(S) / ED DIAGNOSES  Final diagnoses:  Fall, initial encounter  Facial hematoma, initial encounter  Closed fracture of nasal bone, initial encounter    Medications  ibuprofen (ADVIL) tablet 400 mg (400 mg Oral Given 05/19/21 1502)     ED Discharge Orders     None        Note:  This document was prepared using Dragon voice recognition software and may include unintentional dictation errors.    Lucrezia Starch, MD 05/19/21 [phone]

## 2021-05-19 NOTE — ED Triage Notes (Signed)
Pt to ED via [facility] from [facility] for Fall. Pt is in PT for breaking ankle a few months ago. Pts ankle gave out and he hit his face. Pt has laceration on the bridge of his nose and a hematoma on his forehead. Pt is in NAD.   80- HR 95% on 2 L 117/62 119 CBG

## 2021-05-20 ENCOUNTER — Other Ambulatory Visit: Payer: Self-pay

## 2021-05-20 ENCOUNTER — Inpatient Hospital Stay: Payer: Medicare Other

## 2021-05-20 ENCOUNTER — Inpatient Hospital Stay
Admission: EM | Admit: 2021-05-20 | Discharge: 2021-05-25 | DRG: 291 | Disposition: A | Discharge status: Skilled Nursing Facility | Payer: Medicare Other | Attending: Internal Medicine | Admitting: Internal Medicine

## 2021-05-20 ENCOUNTER — Emergency Department: Payer: Medicare Other

## 2021-05-20 DIAGNOSIS — Z79899 Other long term (current) drug therapy: Secondary | ICD-10-CM

## 2021-05-20 DIAGNOSIS — N183 Chronic kidney disease, stage 3 unspecified: Secondary | ICD-10-CM | POA: Diagnosis present

## 2021-05-20 DIAGNOSIS — Z79890 Hormone replacement therapy (postmenopausal): Secondary | ICD-10-CM | POA: Diagnosis not present

## 2021-05-20 DIAGNOSIS — I480 Paroxysmal atrial fibrillation: Secondary | ICD-10-CM | POA: Diagnosis present

## 2021-05-20 DIAGNOSIS — N1831 Chronic kidney disease, stage 3a: Secondary | ICD-10-CM | POA: Diagnosis not present

## 2021-05-20 DIAGNOSIS — Y9301 Activity, walking, marching and hiking: Secondary | ICD-10-CM | POA: Diagnosis present

## 2021-05-20 DIAGNOSIS — W19XXXA Unspecified fall, initial encounter: Secondary | ICD-10-CM | POA: Diagnosis present

## 2021-05-20 DIAGNOSIS — S022XXA Fracture of nasal bones, initial encounter for closed fracture: Secondary | ICD-10-CM | POA: Diagnosis present

## 2021-05-20 DIAGNOSIS — J449 Chronic obstructive pulmonary disease, unspecified: Secondary | ICD-10-CM | POA: Diagnosis present

## 2021-05-20 DIAGNOSIS — I5032 Chronic diastolic (congestive) heart failure: Secondary | ICD-10-CM

## 2021-05-20 DIAGNOSIS — I5033 Acute on chronic diastolic (congestive) heart failure: Secondary | ICD-10-CM | POA: Diagnosis present

## 2021-05-20 DIAGNOSIS — I130 Hypertensive heart and chronic kidney disease with heart failure and stage 1 through stage 4 chronic kidney disease, or unspecified chronic kidney disease: Secondary | ICD-10-CM | POA: Diagnosis present

## 2021-05-20 DIAGNOSIS — E662 Morbid (severe) obesity with alveolar hypoventilation: Secondary | ICD-10-CM | POA: Diagnosis present

## 2021-05-20 DIAGNOSIS — E785 Hyperlipidemia, unspecified: Secondary | ICD-10-CM | POA: Diagnosis not present

## 2021-05-20 DIAGNOSIS — F419 Anxiety disorder, unspecified: Secondary | ICD-10-CM | POA: Diagnosis present

## 2021-05-20 DIAGNOSIS — Z7982 Long term (current) use of aspirin: Secondary | ICD-10-CM

## 2021-05-20 DIAGNOSIS — Z7401 Bed confinement status: Secondary | ICD-10-CM

## 2021-05-20 DIAGNOSIS — M79604 Pain in right leg: Secondary | ICD-10-CM

## 2021-05-20 DIAGNOSIS — G9341 Metabolic encephalopathy: Secondary | ICD-10-CM | POA: Diagnosis present

## 2021-05-20 DIAGNOSIS — Z9981 Dependence on supplemental oxygen: Secondary | ICD-10-CM

## 2021-05-20 DIAGNOSIS — Z6841 Body Mass Index (BMI) 40.0 and over, adult: Secondary | ICD-10-CM

## 2021-05-20 DIAGNOSIS — R55 Syncope and collapse: Secondary | ICD-10-CM | POA: Diagnosis not present

## 2021-05-20 DIAGNOSIS — N189 Chronic kidney disease, unspecified: Secondary | ICD-10-CM

## 2021-05-20 DIAGNOSIS — Z20822 Contact with and (suspected) exposure to covid-19: Secondary | ICD-10-CM | POA: Diagnosis present

## 2021-05-20 DIAGNOSIS — N1832 Chronic kidney disease, stage 3b: Secondary | ICD-10-CM | POA: Diagnosis present

## 2021-05-20 DIAGNOSIS — S0083XA Contusion of other part of head, initial encounter: Secondary | ICD-10-CM | POA: Diagnosis present

## 2021-05-20 DIAGNOSIS — R6520 Severe sepsis without septic shock: Secondary | ICD-10-CM | POA: Diagnosis present

## 2021-05-20 DIAGNOSIS — G629 Polyneuropathy, unspecified: Secondary | ICD-10-CM | POA: Diagnosis present

## 2021-05-20 DIAGNOSIS — G4730 Sleep apnea, unspecified: Secondary | ICD-10-CM | POA: Diagnosis present

## 2021-05-20 DIAGNOSIS — Z888 Allergy status to other drugs, medicaments and biological substances status: Secondary | ICD-10-CM | POA: Diagnosis not present

## 2021-05-20 DIAGNOSIS — E860 Dehydration: Secondary | ICD-10-CM | POA: Diagnosis present

## 2021-05-20 DIAGNOSIS — R600 Localized edema: Secondary | ICD-10-CM

## 2021-05-20 DIAGNOSIS — F32A Depression, unspecified: Secondary | ICD-10-CM | POA: Diagnosis present

## 2021-05-20 DIAGNOSIS — I959 Hypotension, unspecified: Secondary | ICD-10-CM

## 2021-05-20 DIAGNOSIS — N179 Acute kidney failure, unspecified: Secondary | ICD-10-CM | POA: Diagnosis present

## 2021-05-20 DIAGNOSIS — E782 Mixed hyperlipidemia: Secondary | ICD-10-CM | POA: Diagnosis present

## 2021-05-20 DIAGNOSIS — Z8249 Family history of ischemic heart disease and other diseases of the circulatory system: Secondary | ICD-10-CM

## 2021-05-20 DIAGNOSIS — M79605 Pain in left leg: Secondary | ICD-10-CM

## 2021-05-20 DIAGNOSIS — E039 Hypothyroidism, unspecified: Secondary | ICD-10-CM

## 2021-05-20 DIAGNOSIS — R509 Fever, unspecified: Secondary | ICD-10-CM

## 2021-05-20 DIAGNOSIS — J9611 Chronic respiratory failure with hypoxia: Secondary | ICD-10-CM | POA: Diagnosis present

## 2021-05-20 DIAGNOSIS — A419 Sepsis, unspecified organism: Secondary | ICD-10-CM | POA: Diagnosis not present

## 2021-05-20 DIAGNOSIS — R6510 Systemic inflammatory response syndrome (SIRS) of non-infectious origin without acute organ dysfunction: Secondary | ICD-10-CM | POA: Diagnosis not present

## 2021-05-20 DIAGNOSIS — I82409 Acute embolism and thrombosis of unspecified deep veins of unspecified lower extremity: Secondary | ICD-10-CM

## 2021-05-20 LAB — URINALYSIS, COMPLETE (UACMP) WITH MICROSCOPIC
Bacteria, UA: NONE SEEN
Bilirubin Urine: NEGATIVE
Glucose, UA: NEGATIVE mg/dL
Hgb urine dipstick: NEGATIVE
Ketones, ur: NEGATIVE mg/dL
Leukocytes,Ua: NEGATIVE
Nitrite: NEGATIVE
Protein, ur: NEGATIVE mg/dL
Specific Gravity, Urine: 1.008 (ref 1.005–1.030)
Squamous Epithelial / HPF: NONE SEEN (ref 0–5)
pH: 7 (ref 5.0–8.0)

## 2021-05-20 LAB — CBC WITH DIFFERENTIAL/PLATELET
Abs Immature Granulocytes: 0.11 10*3/uL — ABNORMAL HIGH (ref 0.00–0.07)
Basophils Absolute: 0 10*3/uL (ref 0.0–0.1)
Basophils Relative: 0 %
Eosinophils Absolute: 0 10*3/uL (ref 0.0–0.5)
Eosinophils Relative: 0 %
HCT: 36 % — ABNORMAL LOW (ref 39.0–52.0)
Hemoglobin: 11.2 g/dL — ABNORMAL LOW (ref 13.0–17.0)
Immature Granulocytes: 1 %
Lymphocytes Relative: 5 %
Lymphs Abs: 0.6 10*3/uL — ABNORMAL LOW (ref 0.7–4.0)
MCH: 27.9 pg (ref 26.0–34.0)
MCHC: 31.1 g/dL (ref 30.0–36.0)
MCV: 89.6 fL (ref 80.0–100.0)
Monocytes Absolute: 0.7 10*3/uL (ref 0.1–1.0)
Monocytes Relative: 6 %
Neutro Abs: 9.6 10*3/uL — ABNORMAL HIGH (ref 1.7–7.7)
Neutrophils Relative %: 88 %
Platelets: 183 10*3/uL (ref 150–400)
RBC: 4.02 MIL/uL — ABNORMAL LOW (ref 4.22–5.81)
RDW: 16.5 % — ABNORMAL HIGH (ref 11.5–15.5)
WBC: 11 10*3/uL — ABNORMAL HIGH (ref 4.0–10.5)
nRBC: 0 % (ref 0.0–0.2)

## 2021-05-20 LAB — BLOOD GAS, ARTERIAL
Acid-Base Excess: 9.3 mmol/L — ABNORMAL HIGH (ref 0.0–2.0)
Allens test (pass/fail): POSITIVE — AB
Bicarbonate: 32.5 mmol/L — ABNORMAL HIGH (ref 20.0–28.0)
FIO2: 0.28
O2 Saturation: 98.5 %
Patient temperature: 39.4
pCO2 arterial: 42 mmHg (ref 32.0–48.0)
pH, Arterial: 7.5 — ABNORMAL HIGH (ref 7.350–7.450)
pO2, Arterial: 117 mmHg — ABNORMAL HIGH (ref 83.0–108.0)

## 2021-05-20 LAB — RESP PANEL BY RT-PCR (FLU A&B, COVID) ARPGX2
Influenza A by PCR: NEGATIVE
Influenza B by PCR: NEGATIVE
SARS Coronavirus 2 by RT PCR: NEGATIVE

## 2021-05-20 LAB — COMPREHENSIVE METABOLIC PANEL
ALT: 10 U/L (ref 0–44)
AST: 16 U/L (ref 15–41)
Albumin: 3.1 g/dL — ABNORMAL LOW (ref 3.5–5.0)
Alkaline Phosphatase: 50 U/L (ref 38–126)
Anion gap: 12 (ref 5–15)
BUN: 22 mg/dL (ref 8–23)
CO2: 31 mmol/L (ref 22–32)
Calcium: 8.9 mg/dL (ref 8.9–10.3)
Chloride: 97 mmol/L — ABNORMAL LOW (ref 98–111)
Creatinine, Ser: 2.21 mg/dL — ABNORMAL HIGH (ref 0.61–1.24)
GFR, Estimated: 31 mL/min — ABNORMAL LOW (ref >60)
Glucose, Bld: 111 mg/dL — ABNORMAL HIGH (ref 70–99)
Potassium: 4.1 mmol/L (ref 3.5–5.1)
Sodium: 140 mmol/L (ref 135–145)
Total Bilirubin: 1 mg/dL (ref 0.3–1.2)
Total Protein: 6.3 g/dL — ABNORMAL LOW (ref 6.5–8.1)

## 2021-05-20 LAB — PROCALCITONIN: Procalcitonin: 0.25 ng/mL

## 2021-05-20 LAB — BRAIN NATRIURETIC PEPTIDE: B Natriuretic Peptide: 242.5 pg/mL — ABNORMAL HIGH (ref 0.0–100.0)

## 2021-05-20 LAB — LACTIC ACID, PLASMA
Lactic Acid, Venous: 1.5 mmol/L (ref 0.5–1.9)
Lactic Acid, Venous: 0.8 mmol/L (ref 0.5–1.9)

## 2021-05-20 LAB — TROPONIN I (HIGH SENSITIVITY)
Troponin I (High Sensitivity): 20 ng/L — ABNORMAL HIGH (ref <18)
Troponin I (High Sensitivity): 24 ng/L — ABNORMAL HIGH (ref <18)

## 2021-05-20 MED ORDER — VANCOMYCIN HCL IN DEXTROSE 1-5 GM/200ML-% IV SOLN
1000.0000 mg | Freq: Once | INTRAVENOUS | Status: AC
  Administered 2021-05-20: 1000 mg via INTRAVENOUS
  Filled 2021-05-20: qty 200

## 2021-05-20 MED ORDER — ONDANSETRON HCL 4 MG PO TABS: 4.0000 mg | ORAL_TABLET | Freq: Four times a day (QID) | ORAL | Status: DC | PRN

## 2021-05-20 MED ORDER — OCUVITE-LUTEIN PO CAPS
1.0000 | ORAL_CAPSULE | Freq: Two times a day (BID) | ORAL | Status: DC
  Administered 2021-05-20 – 2021-05-25 (×10): 1 via ORAL
  Filled 2021-05-20 (×11): qty 1

## 2021-05-20 MED ORDER — ACETAMINOPHEN 325 MG PO TABS: 650.0000 mg | ORAL_TABLET | Freq: Four times a day (QID) | ORAL | Status: DC | PRN

## 2021-05-20 MED ORDER — METRONIDAZOLE 500 MG/100ML IV SOLN
500.0000 mg | Freq: Two times a day (BID) | INTRAVENOUS | Status: DC
  Administered 2021-05-20 – 2021-05-22 (×4): 500 mg via INTRAVENOUS
  Filled 2021-05-20 (×4): qty 100

## 2021-05-20 MED ORDER — MIDODRINE HCL 5 MG PO TABS
5.0000 mg | ORAL_TABLET | Freq: Three times a day (TID) | ORAL | Status: DC
  Administered 2021-05-20 – 2021-05-22 (×5): 5 mg via ORAL
  Filled 2021-05-20 (×5): qty 1

## 2021-05-20 MED ORDER — LEVOTHYROXINE SODIUM 88 MCG PO TABS
88.0000 ug | ORAL_TABLET | Freq: Every day | ORAL | Status: DC
  Administered 2021-05-21 – 2021-05-25 (×5): 88 ug via ORAL
  Filled 2021-05-20 (×7): qty 1

## 2021-05-20 MED ORDER — ENSURE MAX PROTEIN PO LIQD
11.0000 [oz_av] | Freq: Two times a day (BID) | ORAL | Status: DC
  Administered 2021-05-21 – 2021-05-22 (×2): 11 [oz_av] via ORAL
  Filled 2021-05-20: qty 330

## 2021-05-20 MED ORDER — ASPIRIN EC 81 MG PO TBEC
81.0000 mg | DELAYED_RELEASE_TABLET | Freq: Every day | ORAL | Status: DC
  Administered 2021-05-20 – 2021-05-24 (×5): 81 mg via ORAL
  Filled 2021-05-20 (×5): qty 1

## 2021-05-20 MED ORDER — ACETAMINOPHEN 325 MG RE SUPP
650.0000 mg | Freq: Four times a day (QID) | RECTAL | Status: DC | PRN
  Filled 2021-05-20: qty 1

## 2021-05-20 MED ORDER — LEVALBUTEROL HCL 0.63 MG/3ML IN NEBU: 0.6300 mg | INHALATION_SOLUTION | Freq: Four times a day (QID) | RESPIRATORY_TRACT | Status: DC | PRN

## 2021-05-20 MED ORDER — ATORVASTATIN CALCIUM 20 MG PO TABS
20.0000 mg | ORAL_TABLET | Freq: Every day | ORAL | Status: DC
  Administered 2021-05-20 – 2021-05-24 (×5): 20 mg via ORAL
  Filled 2021-05-20 (×5): qty 1

## 2021-05-20 MED ORDER — VANCOMYCIN HCL 1250 MG/250ML IV SOLN
1250.0000 mg | INTRAVENOUS | Status: DC
  Filled 2021-05-20: qty 250

## 2021-05-20 MED ORDER — TORSEMIDE 5 MG PO TABS
40.0000 mg | ORAL_TABLET | Freq: Every day | ORAL | Status: DC
  Filled 2021-05-20 (×2): qty 8

## 2021-05-20 MED ORDER — VITAMIN D3 25 MCG (1000 UNIT) PO TABS
1000.0000 [IU] | ORAL_TABLET | Freq: Every day | ORAL | Status: DC
  Administered 2021-05-20 – 2021-05-24 (×5): 1000 [IU] via ORAL
  Filled 2021-05-20 (×10): qty 1

## 2021-05-20 MED ORDER — VANCOMYCIN HCL 1500 MG/300ML IV SOLN
1500.0000 mg | Freq: Once | INTRAVENOUS | Status: AC
  Administered 2021-05-20: 1500 mg via INTRAVENOUS
  Filled 2021-05-20: qty 300

## 2021-05-20 MED ORDER — BUPROPION HCL ER (SR) 150 MG PO TB12
150.0000 mg | ORAL_TABLET | Freq: Two times a day (BID) | ORAL | Status: DC
  Administered 2021-05-20 – 2021-05-25 (×10): 150 mg via ORAL
  Filled 2021-05-20 (×11): qty 1

## 2021-05-20 MED ORDER — FUROSEMIDE 10 MG/ML IJ SOLN
80.0000 mg | Freq: Once | INTRAMUSCULAR | Status: AC
  Administered 2021-05-20: 80 mg via INTRAVENOUS
  Filled 2021-05-20: qty 8

## 2021-05-20 MED ORDER — SODIUM CHLORIDE 0.9 % IV SOLN
2.0000 g | Freq: Two times a day (BID) | INTRAVENOUS | Status: DC
  Administered 2021-05-21 – 2021-05-22 (×3): 2 g via INTRAVENOUS
  Filled 2021-05-20 (×3): qty 2

## 2021-05-20 MED ORDER — LACTATED RINGERS IV SOLN: INTRAVENOUS | Status: AC

## 2021-05-20 MED ORDER — ENOXAPARIN SODIUM 80 MG/0.8ML IJ SOSY
0.5000 mg/kg | PREFILLED_SYRINGE | INTRAMUSCULAR | Status: DC
  Administered 2021-05-20 – 2021-05-24 (×5): 75 mg via SUBCUTANEOUS
  Filled 2021-05-20: qty 0.8
  Filled 2021-05-20 (×2): qty 0.75
  Filled 2021-05-20 (×3): qty 0.8

## 2021-05-20 MED ORDER — VITAMIN B-12 1000 MCG PO TABS
1000.0000 ug | ORAL_TABLET | ORAL | Status: DC
  Administered 2021-05-20 – 2021-05-24 (×2): 1000 ug via ORAL
  Filled 2021-05-20: qty 1

## 2021-05-20 MED ORDER — METRONIDAZOLE 500 MG/100ML IV SOLN
500.0000 mg | Freq: Once | INTRAVENOUS | Status: AC
  Administered 2021-05-20: 500 mg via INTRAVENOUS
  Filled 2021-05-20: qty 100

## 2021-05-20 MED ORDER — ONDANSETRON HCL 4 MG/2ML IJ SOLN
4.0000 mg | Freq: Four times a day (QID) | INTRAMUSCULAR | Status: DC | PRN
  Administered 2021-05-24: 4 mg via INTRAVENOUS
  Filled 2021-05-20: qty 2

## 2021-05-20 MED ORDER — ACETAMINOPHEN 325 MG PO TABS
650.0000 mg | ORAL_TABLET | Freq: Four times a day (QID) | ORAL | Status: DC | PRN
  Administered 2021-05-20 – 2021-05-21 (×2): 650 mg via ORAL
  Filled 2021-05-20 (×2): qty 2

## 2021-05-20 MED ORDER — SODIUM CHLORIDE 0.9 % IV SOLN
2.0000 g | Freq: Once | INTRAVENOUS | Status: AC
  Administered 2021-05-20: 2 g via INTRAVENOUS
  Filled 2021-05-20: qty 2

## 2021-05-20 MED ORDER — TORSEMIDE 20 MG PO TABS
40.0000 mg | ORAL_TABLET | Freq: Every day | ORAL | Status: DC
  Administered 2021-05-20 – 2021-05-21 (×2): 40 mg via ORAL
  Filled 2021-05-20 (×2): qty 2

## 2021-05-20 NOTE — ED Notes (Signed)
Daughter calling nurse due to patient reporting right ear hurting. Also reports monitor is alarming

## 2021-05-20 NOTE — Consult Note (Signed)
CODE SEPSIS - PHARMACY COMMUNICATION  **Broad Spectrum Antibiotics should be administered within 1 hour of Sepsis diagnosis**  Time Code Sepsis Called/Page Received: 1406  Antibiotics Ordered:  Vancomycin Cefepime Metronidazole  Time of 1st antibiotic administration: 1433  Additional action taken by pharmacy: none  If necessary, Name of Provider/Nurse Contacted: n/a    [place] Rodriguez-Guzman PharmD, BCPS 05/20/2021 2:42 PM

## 2021-05-20 NOTE — H&P (Addendum)
History and Physical    Omega Pfister [id number] DOB: Dec 25, 1950 DOA: 05/20/2021  PCP: Rusty Aus, MD   Patient coming from: Home  I have personally briefly reviewed patient's old medical records in [place]  Chief Complaint: Change in mental status  Most of the history was obtained from patient's daughter who was at the bedside as patient is unable to provide any history.  HPI: Demorion Ondo is a 70 y.o. male with medical history significant for morbid obesity, obstructive sleep apnea on CPAP, chronic respiratory failure on 2 L of oxygen continuous, hypothyroidism, depression, chronic diastolic dysfunction CHF who presents to the emergency room via EMS for the second time in 24 hours for evaluation of change in mental status. Patient was seen in the emergency room one day prior to his admission for evaluation after he fell and was diagnosed with a hematoma and facial fracture.  He was discharged back to the skilled nursing facility and on the morning of his admission his daughter states that she was called and told that he had slid out of bed and was found sitting on the floor.  She also states she was told he was not acting his usual self and his pulse rate was high as well. EMS was called by the nursing home staff because patient was short of breath and his pulse oximetry on his 2 L of oxygen was in the high 70s. He has chronic lower extremity swelling and has a chronic cough.  He also had a low-grade fever upon arrival to the ER with a temp of 100.2 F and while in the ER his temperature is now 103F. Patient is very confused and is only oriented to person which is not his baseline.  I am unable to do review of systems on this patient. Labs show sodium 140, potassium 4.1, chloride 97, bicarb 31, was 111, BUN 22, creatinine 2.21, calcium 8.9, alkaline phosphatase 50, albumin 3.1, AST 16, ALT 10, total protein 6.3, total bilirubin 1.0, BNP 242, lactic acid 1.5, procalcitonin  0.25, white count 11.0, hemoglobin 11.2, hematocrit 36.7, MCV 89.6, RDW 16.5, platelet count 183 Respiratory viral panel is negative Chest x-ray reviewed by me shows cardiomegaly and pulmonary vascular congestion.  No effusion no lobar consolidation. Initial twelve-lead EKG showed rapid atrial fibrillation with heart rate of 138 and repeat twelve-lead EKG showed sinus tachycardia with PAC's     ED Course: Patient is a 70 year old male with a history of morbid obesity with obesity hypoventilation syndrome on 2 L of oxygen continuous who was sent to the emergency room for evaluation of hypoxia, pulse oximetry was in the high 70s on his 2 L of oxygen as well as tachycardia and change in mental status. Upon arrival to the ER his initial twelve-lead EKG showed rapid atrial fibrillation but he is now in sinus rhythm without any intervention. He is very confused and is only oriented to person not to place or time.  He spiked a fever in the ER with a T-max of 103F and will be admitted to the hospital for further evaluation.    Review of Systems: As per HPI otherwise all other systems reviewed and negative.    Past Medical History:  Diagnosis Date   Anxiety    CHF (congestive heart failure) (HCC)    COPD (chronic obstructive pulmonary disease) (HCC)    Depression    Dysrhythmia    Hyperlipidemia    Hypertension    Hypothyroidism    Sleep apnea  Past Surgical History:  Procedure Laterality Date   FRACTURE SURGERY     ORIF ANKLE FRACTURE Left 12/17/2020   Procedure: OPEN REDUCTION INTERNAL FIXATION (ORIF) ANKLE FRACTURE;  Surgeon: Samara Deist, DPM;  Location: [facility] ORS;  Service: Podiatry;  Laterality: Left;     reports that he has never smoked. He has never used smokeless tobacco. He reports that he does not currently use alcohol. He reports that he does not use drugs.  Allergies  Allergen Reactions   Albuterol Other (See Comments)    Causes afib    Gramineae Pollens Other (See  Comments)    Sneeze and watery eyes Sneeze and watery eyes   Other Other (See Comments)    Other reaction(s): Other (See Comments) Unable to breath when around cats Other reaction(s): Other (See Comments) Unable to breath when around cats    Family History  Problem Relation Age of Onset   Hypertension Mother    Had sepsis on admission [facility] with   Prior to Admission medications   Medication Sig Start Date End Date Taking? Authorizing Provider  aspirin 81 MG EC tablet Take 81 mg by mouth at bedtime.   Yes [provider]  atorvastatin (LIPITOR) 20 MG tablet Take 20 mg by mouth at bedtime. 10/06/20  Yes [provider]  buPROPion (WELLBUTRIN SR) 150 MG 12 hr tablet Take 150 mg by mouth 2 (two) times daily. After 1st meal of day and after dinner 12/02/20  Yes [provider]  Cholecalciferol 25 MCG (1000 UT) capsule Take 1,000 Units by mouth at bedtime.   Yes [provider]  cyanocobalamin 1000 MCG tablet Take 1,000 mcg by mouth 2 (two) times a week. At bedtime. Sundays and Wednesdays   Yes [provider]  gabapentin (NEURONTIN) 300 MG capsule Take 300 mg by mouth 3 (three) times daily. After 1st meal of day, after dinner, and at bedtime 04/07/20  Yes [provider]  levothyroxine (SYNTHROID) 88 MCG tablet Take 88 mcg by mouth daily. 30 to 60 minutes before breakfast on an empty stomach and with a glass of water 12/02/20  Yes [provider]  midodrine (PROAMATINE) 10 MG tablet Take 0.5 tablets (5 mg total) by mouth 3 (three) times daily with meals. 04/29/21  Yes Sharen Hones, MD  Multiple Vitamins-Minerals (PRESERVISION AREDS 2) CAPS Take 1 capsule by mouth 2 (two) times daily. With food. After 1st meal of day and after dinner   Yes [provider]  mupirocin ointment (BACTROBAN) 2 % Apply topically daily. 03/19/21  Yes Lorella Nimrod, MD  torsemide 40 MG TABS Take 40 mg by mouth daily. 04/29/21  Yes Sharen Hones, MD   trolamine salicylate (ASPERCREME) 10 % cream Apply 1 application topically as needed for muscle pain. Apply to knees and feet   Yes [provider]  Vitamins A & D (VITAMIN A & D) ointment Apply 1 application topically as needed. To groin area.   Yes [provider]  Ensure Max Protein (ENSURE MAX PROTEIN) LIQD Take 330 mLs (11 oz total) by mouth 2 (two) times daily. 03/19/21   Lorella Nimrod, MD  levalbuterol [facility] HFA) 45 MCG/ACT inhaler Inhale 2 puffs into the lungs every 4 (four) hours as needed for wheezing. Patient not taking: No sig reported    [provider]  levalbuterol (XOPENEX) 0.63 MG/3ML nebulizer solution Take 3 mLs (0.63 mg total) by nebulization every 6 (six) hours as needed for wheezing or shortness of breath. 03/24/21   Posey Pronto,  Sona, MD  OXYGEN Inhale 2 L into the lungs continuous.    [provider]    Physical Exam: Vitals:   05/20/21 1033 05/20/21 1222 05/20/21 1244 05/20/21 1400  BP:  (!) 104/42 (!) 114/53   Pulse:  (!) 103 (!) 104 (!) 103  Resp:  '19 19 19  '$ Temp:    (!) 103.3 F (39.6 C)  TempSrc:    Oral  SpO2:  94% 93% 92%  Weight: (!) 152 kg     Height: '6\' 1"'$  (1.854 m)        Vitals:   05/20/21 1033 05/20/21 1222 05/20/21 1244 05/20/21 1400  BP:  (!) 104/42 (!) 114/53   Pulse:  (!) 103 (!) 104 (!) 103  Resp:  '19 19 19  '$ Temp:    (!) 103.3 F (39.6 C)  TempSrc:    Oral  SpO2:  94% 93% 92%  Weight: (!) 152 kg     Height: '6\' 1"'$  (1.854 m)         Constitutional: Lethargic.  Oriented only to person.  Ecchymosis over the forehead and nasal arch (POA) HEENT:      Head: Normocephalic and atraumatic.         Eyes: PERLA, EOMI, Conjunctivae are normal. Sclera is non-icteric.       Mouth/Throat: Mucous membranes are moist.       Neck: Supple with no signs of meningismus. Cardiovascular: Tachycardic. No murmurs, gallops, or rubs. 2+ symmetrical distal pulses are present . No JVD. 1+ LE edema Respiratory: Respiratory  effort normal .bilateral air entry in both lung fields   Gastrointestinal: Soft, non tender, and non distended with positive bowel sounds.  Central adiposity Genitourinary: No CVA tenderness. Musculoskeletal: Nontender with normal range of motion in all extremities. No cyanosis, or erythema of extremities. Neurologic: Unable to assess. Skin: Skin is warm, dry. Ecchymoses over forehead and nasal arch Psychiatric: Unable to assess    Labs on Admission: I have personally reviewed following labs and imaging studies  CBC: Recent Labs  Lab 05/20/21 1058  WBC 11.0*  NEUTROABS 9.6*  HGB 11.2*  HCT 36.0*  MCV 89.6  PLT [id number]   Basic Metabolic Panel: Recent Labs  Lab 05/20/21 1058  NA 140  K 4.1  CL 97*  CO2 31  GLUCOSE 111*  BUN 22  CREATININE 2.21*  CALCIUM 8.9   GFR: Estimated Creatinine Clearance: 47.8 mL/min (A) (by C-G formula based on SCr of 2.21 mg/dL (H)). Liver Function Tests: Recent Labs  Lab 05/20/21 1058  AST 16  ALT 10  ALKPHOS 50  BILITOT 1.0  PROT 6.3*  ALBUMIN 3.1*   No results for input(s): LIPASE, AMYLASE in the last 168 hours. No results for input(s): AMMONIA in the last 168 hours. Coagulation Profile: No results for input(s): INR, PROTIME in the last 168 hours. Cardiac Enzymes: No results for input(s): CKTOTAL, CKMB, CKMBINDEX, TROPONINI in the last 168 hours. BNP (last 3 results) No results for input(s): PROBNP in the last 8760 hours. HbA1C: No results for input(s): HGBA1C in the last 72 hours. CBG: No results for input(s): GLUCAP in the last 168 hours. Lipid Profile: No results for input(s): CHOL, HDL, LDLCALC, TRIG, CHOLHDL, LDLDIRECT in the last 72 hours. Thyroid Function Tests: No results for input(s): TSH, T4TOTAL, FREET4, T3FREE, THYROIDAB in the last 72 hours. Anemia Panel: No results for input(s): VITAMINB12, FOLATE, FERRITIN, TIBC, IRON, RETICCTPCT in the last 72 hours. Urine analysis: No results found for: COLORURINE,  APPEARANCEUR, [place],  PHURINE, GLUCOSEU, HGBUR, BILIRUBINUR, KETONESUR, PROTEINUR, UROBILINOGEN, NITRITE, LEUKOCYTESUR  Radiological Exams on Admission: DG Chest 2 View  Result Date: 05/20/2021 CLINICAL DATA:  SOB EXAM: CHEST - 2 VIEW COMPARISON:  August 29 FINDINGS: The heart is enlarged. No pleural effusion. No pneumothorax. Prominence of the pulmonary interstitium. No lobar consolidation. No acute osseous abnormality. IMPRESSION: Cardiomegaly and pulmonary vascular congestion. No effusion or lobar consolidation. Electronically Signed   By: Albin Felling M.D.   On: 05/20/2021 11:56   CT HEAD WO CONTRAST (5MM)  Result Date: 05/19/2021 CLINICAL DATA:  Fall.  Facial trauma.  Forehead hematoma EXAM: CT HEAD WITHOUT CONTRAST TECHNIQUE: Contiguous axial images were obtained from the base of the skull through the vertex without intravenous contrast. COMPARISON:  04/21/2021 FINDINGS: Brain: Moderate low density in the periventricular white matter likely related to small vessel disease. No mass lesion, hemorrhage, hydrocephalus, acute infarct, intra-axial, or extra-axial fluid collection. Vascular: No hyperdense vessel or unexpected calcification. Skull: Left paramidline frontal scalp soft tissue swelling including on 28/3. No underlying skull fracture. Sinuses/Orbits: Normal imaged portions of the orbits and globes. Mucous retention cyst or polyp in the left sphenoid sinus. Clear mastoid air cells. Other: None. IMPRESSION: 1. Left frontal scalp soft tissue swelling, without acute intracranial abnormality. 2. Small vessel ischemic change. Electronically Signed   By: Abigail Miyamoto M.D.   On: 05/19/2021 14:18   CT Cervical Spine Wo Contrast  Result Date: 05/19/2021 CLINICAL DATA:  [place], facial laceration and swelling EXAM: CT CERVICAL SPINE WITHOUT CONTRAST TECHNIQUE: Multidetector CT imaging of the cervical spine was performed without intravenous contrast. Multiplanar CT image reconstructions were also  generated. COMPARISON:  None. FINDINGS: Alignment: Alignment is anatomic. Skull base and vertebrae: No acute fracture. No primary bone lesion or focal pathologic process. Soft tissues and spinal canal: No prevertebral fluid or swelling. No visible canal hematoma. Disc levels: There is marked spondylosis from C4-5 through C7-T1. Central disc osteophyte complex results in at least mild central canal stenosis at C5-6 and C6-7. Upper chest: Central airway is patent.  Lung apices are clear. Other: Reconstructed images demonstrate no additional findings. IMPRESSION: 1. No acute cervical spine fracture. 2. Extensive multilevel spondylosis as above. Electronically Signed   By: Randa Ngo M.D.   On: 05/19/2021 16:09   CT Maxillofacial Wo Contrast  Result Date: 05/19/2021 CLINICAL DATA:  Facial trauma, nasal laceration, scalp hematoma EXAM: CT MAXILLOFACIAL WITHOUT CONTRAST TECHNIQUE: Multidetector CT imaging of the maxillofacial structures was performed. Multiplanar CT image reconstructions were also generated. COMPARISON:  None. FINDINGS: Osseous: Small chip fracture at the tip of the nasal bone. No other acute facial bone fractures. Orbits: Negative. No traumatic or inflammatory finding. Sinuses: Clear. Soft tissues: Soft tissue swelling in the left supraorbital region and left frontal scalp. Minimal swelling along the nasal bridge. Remaining soft tissues are unremarkable. Limited intracranial: No significant or unexpected finding. IMPRESSION: 1. Small chip fracture at the tip of the nasal bone best seen on sagittal reconstructed images. 2. Soft tissue swelling of the nasal bridge and left frontal scalp. Electronically Signed   By: Randa Ngo M.D.   On: 05/19/2021 16:07     Assessment/Plan Principal Problem:   Sepsis ([place]) Active Problems:   Sleep apnea   Morbid obesity with BMI of 45.0-49.9, adult (HCC)   Depression   Acquired hypothyroidism   Acute on chronic diastolic CHF (congestive heart  failure) ([place])   Acute metabolic encephalopathy   CKD (chronic kidney disease), stage IIIa  Patient is a 70 year old male with multiple medical problems who presents from skilled nursing facility for evaluation of hypoxia despite being on 2L of oxygen, he had a pulse oximetry in the high 70's associated with tachycardia and change in mental status.    Sepsis (POA) As evidenced by fever with a T-max of 103F, tachycardia and tachypnea No obvious source of sepsis at this time Follow-up results of blood cultures Will place patient empirically on vancomycin, cefepime and Flagyl adjusted to renal function.    Acute metabolic encephalopathy At baseline patient is usually awake, alert and oriented to person place and time. Today on admission he is only oriented to person. Patient's change in mental status is most likely secondary to sepsis but will repeat CT scan of the head without contrast to rule out intracranial bleed. Obtain arterial blood gas to rule out uncompensated hypercapnic respiratory failure Expect improvement in patient's mental status with resolution of acute illness Aspiration precautions   Rule out venous thromboembolic disease In this patient who is morbidly obese and bedbound and presents for evaluation of hypoxia and tachycardia with new onset atrial fibrillation. Unable to get a CT angiogram due to patient's renal function Will obtain bilateral lower extremity venous Doppler      Atrial fibrillation  with RVR (Paroxysmal) Noted on admission but patient is now in sinus rhythm Patient has a CHA2DS2-VASc score of 2 and ideally requires long-term anticoagulation as primary prophylaxis for an acute stroke Will hold off on long-term anticoagulation therapy for now until results of repeat CT scan of the head is available    Chronic diastolic dysfunction CHF Stable and not acutely exacerbated Continue furosemide        Anxiety and depression Continue  bupropion       Morbid obesity (BMI 46) With complications of obstructive sleep apnea and obesity hypoventilation syndrome Continue CPAP at bedtime Continue oxygen supplementation at 2 L       Hypothyroidism Continue Synthroid         Neuropathy Hold gabapentin due to mental status changes    Stage 3a CKD Renal function is stable Monitor closely  DVT prophylaxis: SCD (we will switch patient to Lovenox/heparin once appropriate CT scan of the head become available and an acute bleed is ruled out) Code Status: full code  Family Communication: Greater than 50% of time was spent discussing patient's condition and plan of care with his daughter at the bedside.  All questions and concerns have been addressed.  She verbalizes understanding and agrees with the plan. Disposition Plan: Back to previous home environment Consults called: none  Status:At the time of admission, it appears that the appropriate admission status for this patient is inpatient. This is judged to be reasonable and necessary to provide the required intensity of service to ensure the patient's safety given the presenting symptoms, physical exam findings, and initial radiographic and laboratory data in the context of their comorbid conditions. Patient requires inpatient status due to high intensity of service, high risk for further deterioration and high frequency of surveillance required.     Collier Bullock MD [facility]     05/20/2021, 2:20 PM

## 2021-05-20 NOTE — ED Triage Notes (Signed)
Pt comes into the ED via [facility] from [facility].  Pt seen here yesterday for a fall and dx with hematoma and facial fracture.  Pt called out this morning for [facility].  Pt able to answer some questions, but not all.  Pt denies any problems at this time.  Pt does present with a lot of tremors.  Per facility, he is not acting at his baseline.   CBG 144 126/57 24 RR 95 % on 2L (baseline) 99.3 oral

## 2021-05-20 NOTE — Sepsis Progress Note (Signed)
ELink tracking the code Sepsis. ?

## 2021-05-20 NOTE — ED Provider Notes (Signed)
[facility] Emergency Department Provider Note   ____________________________________________   Event Date/Time   First MD Initiated Contact with Patient 05/20/21 1051     (approximate)  I have reviewed the triage vital signs and the nursing notes.   HISTORY  Chief Complaint hypoxia and Shortness of Breath    HPI Christopher [name] is a 70 y.o. male with past medical history of hypothyroidism, diastolic CHF, sleep apnea, and chronic respiratory failure on 2 L nasal cannula who presents to the ED for shortness of breath.  History is limited as patient is somnolent and does not always respond to questions.  Per daughter at bedside, staff at his nursing facility was concerned that he was having increasing difficulty breathing today.  He was reportedly found sitting on the side of his bed with O2 sats in the high 70s on his typical 2 L nasal cannula.  Patient states that his breathing feels no worse than usual, does endorse chronic cough and shortness of breath.  Daughter states that he does appear more fatigued than usual.  Patient complains of "pain all over" but denies any chest pain.  He has not had any recent fevers, daughter states legs are chronically swollen but no worse than usual today.  He was seen in the ED yesterday for a fall, found to have small nasal fracture but work-up otherwise unremarkable.        Past Medical History:  Diagnosis Date   Anxiety    CHF (congestive heart failure) (HCC)    COPD (chronic obstructive pulmonary disease) ([place])    Depression    Dysrhythmia    Hyperlipidemia    Hypertension    Hypothyroidism    Sleep apnea     Patient Active Problem List   Diagnosis Date Noted   Pressure injury of skin 04/25/2021   Acute respiratory failure ([place]) 04/21/2021   Acute renal failure superimposed on stage 3a chronic kidney disease ([place]) 04/21/2021   Acute on chronic respiratory failure with hypoxia and hypercapnia (HCC) Q000111Q    Acute metabolic encephalopathy Q000111Q   CKD (chronic kidney disease), stage IIIa 03/22/2021   Leukocytosis 03/22/2021   Elevated troponin 03/22/2021   Open wound of left foot 03/22/2021   Osteomyelitis of ankle or foot, acute, left ([place]) 03/17/2021   Acute on chronic respiratory failure ([place]) 03/17/2021   Osteomyelitis ([place]) 12/16/2020   Closed left ankle fracture 12/16/2020   Depression 12/16/2020   Medicare annual wellness visit, initial 11/14/2019   B12 deficiency 11/14/2019   Hyperlipidemia, mixed 10/31/2019   Primary osteoarthritis of right knee 03/16/2019   Obstructive sleep apnea (adult) (pediatric) 10/31/2018   Traumatic incomplete tear of left rotator cuff 10/02/2018   Morbid obesity with BMI of 45.0-49.9, adult ([place]) 09/20/2018   Acquired hypothyroidism 09/20/2018   Acute on chronic diastolic CHF (congestive heart failure) ([place]) 09/20/2018   Benign essential hypertension 09/20/2018    Past Surgical History:  Procedure Laterality Date   FRACTURE SURGERY     ORIF ANKLE FRACTURE Left 12/17/2020   Procedure: OPEN REDUCTION INTERNAL FIXATION (ORIF) ANKLE FRACTURE;  Surgeon: Samara Deist, DPM;  Location: [facility] ORS;  Service: Podiatry;  Laterality: Left;    Prior to Admission medications   Medication Sig Start Date End Date Taking? Authorizing Provider  aspirin 81 MG EC tablet Take 81 mg by mouth at bedtime.   Yes [provider]  atorvastatin (LIPITOR) 20 MG tablet Take 20 mg by mouth at bedtime. 10/06/20  Yes [provider]  buPROPion (WELLBUTRIN SR) 150 MG 12 hr tablet Take 150 mg by mouth 2 (two) times daily. After 1st meal of day and after dinner 12/02/20  Yes [provider]  Cholecalciferol 25 MCG (1000 UT) capsule Take 1,000 Units by mouth at bedtime.   Yes [provider]  cyanocobalamin 1000 MCG tablet Take 1,000 mcg by mouth 2 (two) times a week. At bedtime. Sundays and Wednesdays   Yes [provider]  gabapentin  (NEURONTIN) 300 MG capsule Take 300 mg by mouth 3 (three) times daily. After 1st meal of day, after dinner, and at bedtime 04/07/20  Yes [provider]  levothyroxine (SYNTHROID) 88 MCG tablet Take 88 mcg by mouth daily. 30 to 60 minutes before breakfast on an empty stomach and with a glass of water 12/02/20  Yes [provider]  midodrine (PROAMATINE) 10 MG tablet Take 0.5 tablets (5 mg total) by mouth 3 (three) times daily with meals. 04/29/21  Yes Sharen Hones, MD  Multiple Vitamins-Minerals (PRESERVISION AREDS 2) CAPS Take 1 capsule by mouth 2 (two) times daily. With food. After 1st meal of day and after dinner   Yes [provider]  mupirocin ointment (BACTROBAN) 2 % Apply topically daily. 03/19/21  Yes Lorella Nimrod, MD  torsemide 40 MG TABS Take 40 mg by mouth daily. 04/29/21  Yes Sharen Hones, MD  trolamine salicylate (ASPERCREME) 10 % cream Apply 1 application topically as needed for muscle pain. Apply to knees and feet   Yes [provider]  Vitamins A & D (VITAMIN A & D) ointment Apply 1 application topically as needed. To groin area.   Yes [provider]  Ensure Max Protein (ENSURE MAX PROTEIN) LIQD Take 330 mLs (11 oz total) by mouth 2 (two) times daily. 03/19/21   Lorella Nimrod, MD  levalbuterol [facility] HFA) 45 MCG/ACT inhaler Inhale 2 puffs into the lungs every 4 (four) hours as needed for wheezing. Patient not taking: No sig reported    [provider]  levalbuterol (XOPENEX) 0.63 MG/3ML nebulizer solution Take 3 mLs (0.63 mg total) by nebulization every 6 (six) hours as needed for wheezing or shortness of breath. 03/24/21   Fritzi Mandes, MD  OXYGEN Inhale 2 L into the lungs continuous.    [provider]    Allergies Albuterol, Gramineae pollens, and Other  Family History  Problem Relation Age of Onset   Hypertension Mother     Social History Social History   Tobacco Use   Smoking status: Never   Smokeless tobacco:  Never  Vaping Use   Vaping Use: Never used  Substance Use Topics   Alcohol use: Not Currently   Drug use: Never    Review of Systems  Constitutional: No fever/chills.  Positive for weakness and fatigue. Eyes: No visual changes. ENT: No sore throat. Cardiovascular: Denies chest pain. Respiratory: Positive for cough and shortness of breath. Gastrointestinal: No abdominal pain.  No nausea, no vomiting.  No diarrhea.  No constipation. Genitourinary: Negative for dysuria. Musculoskeletal: Negative for back pain.  Positive for myalgias. Skin: Negative for rash. Neurological: Negative for headaches, focal weakness or numbness.  ____________________________________________   PHYSICAL EXAM:  VITAL SIGNS: ED Triage Vitals  Enc Vitals Group     BP 05/20/21 1031 (!) 118/57     Pulse Rate 05/20/21 1031 (!) 140     Resp 05/20/21 1031 (!) 22     Temp 05/20/21 1031 100.2 F (37.9 C)     Temp Source 05/20/21 1031  Oral     SpO2 05/20/21 1031 98 %     Weight 05/20/21 1033 (!) 335 lb 1.6 oz (152 kg)     Height 05/20/21 1033 '6\' 1"'$  (1.854 m)     Head Circumference --      Peak Flow --      Pain Score --      Pain Loc --      Pain Edu? --      Excl. in [place]? --     Constitutional: Alert and oriented. Eyes: Conjunctivae are normal. Head: Atraumatic. Nose: No congestion/rhinnorhea. Mouth/Throat: Mucous membranes are moist. Neck: Normal ROM Cardiovascular: Tachycardic, regular rhythm. Grossly normal heart sounds.  2+ radial pulses bilaterally. Respiratory: Tachypneic with mildly increased respiratory effort.  No retractions. Lungs sounds faint bilaterally with no obvious wheezing or rales. Gastrointestinal: Soft and nontender. No distention. Genitourinary: deferred Musculoskeletal: No lower extremity tenderness, 1+ pitting edema to knees bilaterally. Neurologic:  Normal speech and language. No gross focal neurologic deficits are appreciated. Skin:  Skin is warm, dry and intact. No rash  noted. Psychiatric: Mood and affect are normal. Speech and behavior are normal.  ____________________________________________   LABS (all labs ordered are listed, but only abnormal results are displayed)  Labs Reviewed  CBC WITH DIFFERENTIAL/PLATELET - Abnormal; Notable for the following components:      Result Value   WBC 11.0 (*)    RBC 4.02 (*)    Hemoglobin 11.2 (*)    HCT 36.0 (*)    RDW 16.5 (*)    Neutro Abs 9.6 (*)    Lymphs Abs 0.6 (*)    Abs Immature Granulocytes 0.11 (*)    All other components within normal limits  COMPREHENSIVE METABOLIC PANEL - Abnormal; Notable for the following components:   Chloride 97 (*)    Glucose, Bld 111 (*)    Creatinine, Ser 2.21 (*)    Total Protein 6.3 (*)    Albumin 3.1 (*)    GFR, Estimated 31 (*)    All other components within normal limits  BRAIN NATRIURETIC PEPTIDE - Abnormal; Notable for the following components:   B Natriuretic Peptide 242.5 (*)    All other components within normal limits  BLOOD GAS, VENOUS - Abnormal; Notable for the following components:   pH, Ven 7.44 (*)    Bicarbonate 36.0 (*)    Acid-Base Excess 10.2 (*)    All other components within normal limits  TROPONIN I (HIGH SENSITIVITY) - Abnormal; Notable for the following components:   Troponin I (High Sensitivity) 20 (*)    All other components within normal limits  RESP PANEL BY RT-PCR (FLU A&B, COVID) ARPGX2  CULTURE, BLOOD (ROUTINE X 2)  CULTURE, BLOOD (ROUTINE X 2)  LACTIC ACID, PLASMA  PROCALCITONIN  URINALYSIS, COMPLETE (UACMP) WITH MICROSCOPIC  TROPONIN I (HIGH SENSITIVITY)   ____________________________________________  EKG  ED ECG REPORT I, Blake Divine, the attending physician, personally viewed and interpreted this ECG.   Date: 05/20/2021  EKG Time: 10:37  Rate: 138  Rhythm: atrial fibrillation  Axis: Normal  Intervals:none  ST&T Change: None  ED ECG REPORT I, Blake Divine, the attending physician, personally viewed and  interpreted this ECG.   Date: 05/20/2021  EKG Time: 11:04  Rate: 105  Rhythm: sinus tachycardia  Axis: Normal  Intervals:none  ST&T Change: None    PROCEDURES  Procedure(s) performed (including Critical Care):  .Critical Care Performed by: Blake Divine, MD Authorized by: Blake Divine, MD   Critical care provider statement:  Critical care time (minutes):  45   Critical care time was exclusive of:  Separately billable procedures and treating other patients and teaching time   Critical care was necessary to treat or prevent imminent or life-threatening deterioration of the following conditions:  Sepsis   Critical care was time spent personally by me on the following activities:  Discussions with consultants, evaluation of patient's response to treatment, examination of patient, ordering and performing treatments and interventions, ordering and review of laboratory studies, ordering and review of radiographic studies, pulse oximetry, re-evaluation of patient's condition, obtaining history from patient or surrogate and review of old charts   I assumed direction of critical care for this patient from another provider in my specialty: no     Care discussed with: admitting provider     ____________________________________________   INITIAL IMPRESSION / [place] / ED COURSE      70 year old male with past medical history of diastolic CHF, hypothyroidism, OSA, and chronic respiratory failure on 2 L nasal cannula presents to the ED from nursing facility due to concern for difficulty breathing and reported hypoxia on his usual supplemental oxygen.  Patient does appear mildly tachypneic with increased work of breathing, lung sounds distant bilaterally but no wheezing or rails appreciated.  Patient does appear edematous in his lower extremities, however daughter reports this is chronic and unchanged.  He is maintaining O2 sats on his usual 2 L nasal cannula here in the ED,  does have a history of hypercapnic respiratory failure and we will check VBG.  Initial EKG showed significant tachycardia and concerning for atrial fibrillation, however repeat shows sinus tachycardia with improved rate, no ischemic changes.  Chest x-ray and labs are pending, patient with borderline fever and we will start sepsis work-up but hold off on antibiotics.  Chest x-ray reviewed by me and concerning for pulmonary edema, explaining patient's worsening difficulty breathing.  We will diurese with IV Lasix but he will require cautious diuresis given mild AKI.  On reassessment, patient now appears to be hallucinating, noted to be febrile on recheck.  While procalcitonin is reassuring, I remain concerned for sepsis and we will start broad-spectrum antibiotics.  Findings discussed with hospitalist and will check CT head as well as CT of his chest.  UA is also pending and UTI as a possible source of infection.  Case discussed with hospitalist for admission.      ____________________________________________   FINAL CLINICAL IMPRESSION(S) / ED DIAGNOSES  Final diagnoses:  Sepsis without acute organ dysfunction, due to unspecified organism [facility])  Acute on chronic diastolic congestive heart failure [facility])     ED Discharge Orders     None        Note:  This document was prepared using Dragon voice recognition software and may include unintentional dictation errors.    Blake Divine, MD 05/20/21 [phone]

## 2021-05-20 NOTE — ED Triage Notes (Signed)
Pt to ED [facility] from [facility] for shob that started today. Daughter reports pt is frequently here for high co2 Denies shob at this time Bruising noted to forehead, was evaluated yesterday for same after fall Pt disoriented to time, situation and place, daughter reports this is normal

## 2021-05-20 NOTE — Sepsis Progress Note (Signed)
Appropriate sepsis interventions. Will close sepsis monitoring.

## 2021-05-20 NOTE — ED Notes (Signed)
Patient transported to X-ray 

## 2021-05-20 NOTE — Sepsis Progress Note (Signed)
Notified bedside nurse of need to administer antibiotics.  

## 2021-05-20 NOTE — Progress Notes (Addendum)
Patient admitted with sepsis without septic shock due to unknown source. Lactic: 1.5, Baseline PCT: 0.25, UA: neg for UTI, CXR: No infectious process, CT: No infectious process. Initial interventions/workup included: ? L of NS/LR & Cefepime/ Vancomycin/ Metronidazole. Patient received IV Lasix 80 mg x 1 and Torsemide for pulmonary vascular congestion. PCCM consulted for hypotension with MAP<65.  On review of chart, he was afebrile with blood pressure 91/55 mm Hg and pulse rate 88 beats/min. Sats 98 % on RA, RR 21.  -Patient is hemodynamically stable and does not require vasopressor or ventilatory support at this time. Please re-consult PCCM if further critical care needs is required.   Rufina Falco, DNP, CCRN, FNP-C, AGACNP-BC Acute Care Nurse Practitioner  [facility] Pager: [phone] [place] at [facility]

## 2021-05-20 NOTE — Consult Note (Signed)
PHARMACY -  BRIEF ANTIBIOTIC NOTE   Pharmacy has received consult(s) for Cefepime and Vancomycin from an ED provider.  The patient's profile has been reviewed for ht/wt/allergies/indication/available labs.    One time order(s) placed for Cefepime and Vancomycin  Further antibiotics/pharmacy consults should be ordered by admitting physician if indicated.                       Thank you, Alexine Pilant Rodriguez-Guzman PharmD, BCPS 05/20/2021 2:40 PM

## 2021-05-20 NOTE — ED Notes (Signed)
Patient placed in hospital bed

## 2021-05-20 NOTE — ED Notes (Signed)
Patient cleaned and new brief placed. Male external catheter put on patient (vimpat). Patient pulled up in bed

## 2021-05-20 NOTE — Consult Note (Addendum)
Pharmacy Antibiotic Note  Christopher [name] is a 70 y.o. male w/ h/o morbid obesity (BMI 44), OSA (on CPAP, w/ 2L [place] [facility]), hypothyroidism, MDD, diaCHF who presents to the ED via EMS for the AMS x2 per 24hrs now admitted on 05/20/2021 with c/f sepsis (unk source).  Pharmacy has been consulted for Vancomycin & cefepime dosing.  Plan: MRSA PCR ordered, Scr appears elevated slightly since BL over last year running 1.8-2.1. Will CTM cultures for ability to narrow. Vancomycin 1g+1.5g (2.5g) x1 loading dose; then current renal fxn would start Vancomycin 1.25g IV q24h (9/29 '@1800'$ ) (est AUC 489.9 based on SCr 2.21; Vd 0.5 (BMI 44); dosing IBW) Measure vancomycin AUC at steady state as indicated Cefepime 2g x1 in ED; then Cefepime 2g q12h  Also receiving Metronidazole '500mg'$  IV q12h  Height: '6\' 1"'$  (185.4 cm) Weight: (!) 152 kg (335 lb 1.6 oz) IBW/kg (Calculated) : 79.9  Temp (24hrs), Avg:101.8 F (38.8 C), Min:100.2 F (37.9 C), Max:103.3 F (39.6 C)  Recent Labs  Lab 05/20/21 1058  WBC 11.0*  CREATININE 2.21*  LATICACIDVEN 1.5    Estimated Creatinine Clearance: 47.8 mL/min (A) (by C-G formula based on SCr of 2.21 mg/dL (H)).    Allergies  Allergen Reactions   Albuterol Other (See Comments)    Causes afib    Gramineae Pollens Other (See Comments)    Sneeze and watery eyes Sneeze and watery eyes   Other Other (See Comments)    Other reaction(s): Other (See Comments) Unable to breath when around cats Other reaction(s): Other (See Comments) Unable to breath when around cats    Antimicrobials this admission: Vancomycin (9/28 >>  Cefepime (9/28 >>  Metronidazole (9/28 >>  Dose adjustments this admission: CTM creatinine. Pt has CKD3 and slightly above BL scr over last year, watching trend to adjust dose prn.  Microbiology results: 9/28 BCx: sent/pending 9/28 MRSA PCR: sent/pending 9/28 COVID/FLU - negative  Thank you for allowing pharmacy to be a part of this patient's  care.  Lorna Dibble 05/20/2021 4:31 PM

## 2021-05-21 ENCOUNTER — Inpatient Hospital Stay: Payer: Medicare Other

## 2021-05-21 DIAGNOSIS — E785 Hyperlipidemia, unspecified: Secondary | ICD-10-CM

## 2021-05-21 DIAGNOSIS — I480 Paroxysmal atrial fibrillation: Secondary | ICD-10-CM | POA: Insufficient documentation

## 2021-05-21 DIAGNOSIS — N179 Acute kidney failure, unspecified: Secondary | ICD-10-CM

## 2021-05-21 DIAGNOSIS — R6510 Systemic inflammatory response syndrome (SIRS) of non-infectious origin without acute organ dysfunction: Secondary | ICD-10-CM

## 2021-05-21 DIAGNOSIS — R55 Syncope and collapse: Secondary | ICD-10-CM

## 2021-05-21 DIAGNOSIS — E039 Hypothyroidism, unspecified: Secondary | ICD-10-CM

## 2021-05-21 DIAGNOSIS — I5032 Chronic diastolic (congestive) heart failure: Secondary | ICD-10-CM

## 2021-05-21 DIAGNOSIS — N189 Chronic kidney disease, unspecified: Secondary | ICD-10-CM

## 2021-05-21 LAB — CBC
HCT: 32.8 % — ABNORMAL LOW (ref 39.0–52.0)
Hemoglobin: 10.2 g/dL — ABNORMAL LOW (ref 13.0–17.0)
MCH: 27.9 pg (ref 26.0–34.0)
MCHC: 31.1 g/dL (ref 30.0–36.0)
MCV: 89.6 fL (ref 80.0–100.0)
Platelets: 142 10*3/uL — ABNORMAL LOW (ref 150–400)
RBC: 3.66 MIL/uL — ABNORMAL LOW (ref 4.22–5.81)
RDW: 16.8 % — ABNORMAL HIGH (ref 11.5–15.5)
WBC: 9 10*3/uL (ref 4.0–10.5)
nRBC: 0 % (ref 0.0–0.2)

## 2021-05-21 LAB — PROTIME-INR
INR: 1.2 (ref 0.8–1.2)
Prothrombin Time: 15.4 seconds — ABNORMAL HIGH (ref 11.4–15.2)

## 2021-05-21 LAB — BASIC METABOLIC PANEL
Anion gap: 11 (ref 5–15)
BUN: 28 mg/dL — ABNORMAL HIGH (ref 8–23)
CO2: 33 mmol/L — ABNORMAL HIGH (ref 22–32)
Calcium: 8.3 mg/dL — ABNORMAL LOW (ref 8.9–10.3)
Chloride: 95 mmol/L — ABNORMAL LOW (ref 98–111)
Creatinine, Ser: 2.43 mg/dL — ABNORMAL HIGH (ref 0.61–1.24)
GFR, Estimated: 28 mL/min — ABNORMAL LOW (ref >60)
Glucose, Bld: 105 mg/dL — ABNORMAL HIGH (ref 70–99)
Potassium: 4 mmol/L (ref 3.5–5.1)
Sodium: 139 mmol/L (ref 135–145)

## 2021-05-21 LAB — LACTIC ACID, PLASMA: Lactic Acid, Venous: 0.9 mmol/L (ref 0.5–1.9)

## 2021-05-21 LAB — MRSA NEXT GEN BY PCR, NASAL: MRSA by PCR Next Gen: NOT DETECTED

## 2021-05-21 LAB — PROCALCITONIN: Procalcitonin: 0.42 ng/mL

## 2021-05-21 LAB — CORTISOL-AM, BLOOD: Cortisol - AM: 18.3 ug/dL (ref 6.7–22.6)

## 2021-05-21 MED ORDER — LACTATED RINGERS IV SOLN: INTRAVENOUS | Status: DC

## 2021-05-21 MED ORDER — SODIUM CHLORIDE 0.9 % IV SOLN: INTRAVENOUS | Status: DC

## 2021-05-21 NOTE — Progress Notes (Signed)
Patient ID: Christopher [name], male   DOB: 1951/05/25, 70 y.o.   MRN: [id number] [facility] PROGRESS NOTE  Christopher [name] [id number] DOB: 1950-09-01 DOA: 05/20/2021 PCP: Rusty Aus, MD  HPI/Subjective: Patient feeling okay.  Came in after a fall.  Has bruising under both eyes and fractured his nose.  He also had a fever of 103.  In speaking with the patient's wife, he has been at rehab.  Objective: Vitals:   05/21/21 1100 05/21/21 1130  BP: 99/61 98/64  Pulse: 84 88  Resp: (!) 21 15  Temp:    SpO2: 99% 98%   No intake or output data in the 24 hours ending 05/21/21 1513 Filed Weights   05/20/21 1033  Weight: (!) 152 kg    ROS: Review of Systems  Respiratory:  Negative for cough and shortness of breath.   Cardiovascular:  Negative for chest pain.  Gastrointestinal:  Negative for abdominal pain, nausea and vomiting.  Exam: Physical Exam HENT:     Head: Normocephalic.     Mouth/Throat:     Pharynx: No oropharyngeal exudate.  Eyes:     General: Lids are normal.     Conjunctiva/sclera: Conjunctivae normal.     Pupils: Pupils are equal, round, and reactive to light.  Cardiovascular:     Rate and Rhythm: Normal rate and regular rhythm.     Heart sounds: Normal heart sounds, S1 normal and S2 normal.  Pulmonary:     Breath sounds: Examination of the right-lower field reveals decreased breath sounds. Examination of the left-lower field reveals decreased breath sounds. Decreased breath sounds present. No wheezing, rhonchi or rales.  Abdominal:     Palpations: Abdomen is soft.     Tenderness: There is no abdominal tenderness.  Musculoskeletal:     Right lower leg: Swelling present.     Left lower leg: Swelling present.  Skin:    General: Skin is warm.     Comments: Left lower extremity slight warmth and chronic lower extremity discoloration, right lower extremity chronic lower extremity discoloration.  Bilateral eye area with bruising.  Nose with bruising.   Neurological:     Mental Status: He is alert and oriented to person, place, and time.      Scheduled Meds:  aspirin EC  81 mg Oral QHS   atorvastatin  20 mg Oral QHS   buPROPion  150 mg Oral BID   cholecalciferol  1,000 Units Oral QHS   enoxaparin (LOVENOX) injection  0.5 mg/kg Subcutaneous Q24H   levothyroxine  88 mcg Oral Daily   midodrine  5 mg Oral TID WC   multivitamin-lutein  1 capsule Oral BID   Ensure Max Protein  11 oz Oral BID   torsemide  40 mg Oral Daily   cyanocobalamin  1,000 mcg Oral Once per day on Sun Wed   Continuous Infusions:  ceFEPime (MAXIPIME) IV Stopped (05/21/21 0332)   metronidazole Stopped (05/21/21 0458)    Assessment/Plan:  Systemic inflammatory response syndrome.  Patient had a fever of 103, leukocytosis of 11, tachypnea.  No source of fever at this point.  Discontinue vancomycin.  Continue Maxipime and Flagyl for now.  Repeat chest x-ray tomorrow.  Questionable left lower extremity cellulitis. Acute metabolic encephalopathy has improved.  Repeat CT scan of the head does not show any bleed.  No CO2 retention on ABG. Paroxysmal atrial fibrillation.  Now in normal sinus rhythm.  Holding off on anticoagulation with head trauma. Fall versus syncope.  Monitor on telemetry.  Check  orthostatic vital signs.  Continue IV fluids.  On midodrine to lift up blood pressure.  We will check carotid ultrasound. Chronic diastolic congestive heart failure.  Gentle IV fluids.  Holding Lasix Acute kidney injury on chronic kidney disease stage IIIb.  Gentle IV fluids. COPD and sleep apnea Hyperlipidemia unspecified on atorvastatin Hypothyroidism unspecified on levothyroxine Depression on Wellbutrin Weakness.  Physical therapy recommending rehab Morbid obesity with BMI 44.21    Code Status:     Code Status Orders  (From admission, onward)           Start     Ordered   05/20/21 1545  Full code  Continuous        05/20/21 1550           Code Status  History     Date Active Date Inactive Code Status Order ID Comments User Context   04/21/2021 0212 04/29/2021 1659 Full Code [id number]  Sidney Ace Arvella Merles, MD ED   03/22/2021 1431 03/24/2021 2209 Full Code [id number]  Ivor Costa, MD ED   03/17/2021 1613 03/20/2021 2158 Full Code [id number]  Collier Bullock, MD ED   12/16/2020 1725 12/22/2020 2140 Full Code [id number]  Cox, Amy Delane Ginger, DO ED      Family Communication: Spoke with wife on the phone Disposition Plan: Status is: Inpatient  Dispo: The patient is from: Rehab              Anticipated d/c is to: Rehab              Patient currently had fever of 103 and a fall with fracture of his nose and bruising around his eyes.  Needs further monitoring here in the hospital to find a reason.   Difficult to place patient.  No.  Antibiotics: Maxipime and Flagyl  Time spent: 27 minutes  [place]

## 2021-05-21 NOTE — ED Notes (Signed)
PT at bedside.

## 2021-05-21 NOTE — Evaluation (Signed)
Physical Therapy Evaluation Patient Details Name: Christopher [name] MRN: [id number] DOB: [birth year] Today's Date: 05/21/2021  History of Present Illness  70 y/o M admitted from [facility] twice in the last 2 days due to falls, admitted this time due to sepsis.  Was also here 1 mo ago for SOB & hypotension. PMH: dCHF, depression, anxiety, hypothyroidism, sleep apnea, chronic respiratory failure on 2L O2,  Clinical Impression  Pt limited with mobility but did show good effort despite these limitations.  He does not recall how much he has actually been able to be doing with PT at his facility but it appears he is essentially w/c bound.  Pt showed great effort with attempts to side step along EOB, but fatigued quickly, needed very heavy UE use of walker and could not clear either foot with heel-toe shuffle/drag to the L.       Recommendations for follow up therapy are one component of a multi-disciplinary discharge planning process, led by the attending physician.  Recommendations may be updated based on patient status, additional functional criteria and insurance authorization.  Follow Up Recommendations SNF    Equipment Recommendations  None recommended by PT    Recommendations for Other Services       Precautions / Restrictions Precautions Precautions: Fall Restrictions Weight Bearing Restrictions: No LLE Weight Bearing:  (h/o L ankle fracture ~6 months ago)      Mobility  Bed Mobility Overal bed mobility: Needs Assistance Bed Mobility: Supine to Sit;Sit to Supine     Supine to sit: Min assist Sit to supine: Min assist   General bed mobility comments: Pt showed great effort with maneuvering in the bed, did need some light assist, but not excessive, assist - heavy UE use on the bed rails    Transfers Overall transfer level: Needs assistance Equipment used: Rolling walker (2 wheeled) Transfers: Sit to/from Stand Sit to Stand: Min assist        Lateral/Scoot  Transfers: Min guard;Min assist    Ambulation/Gait             General Gait Details: no true ambulation, able to very laboriously shuffle along EOB with heavy walker reliance.  He was able to bear weight on the L but clearly not comfortable with it and relatively quickly asks to sit back down  Stairs            Wheelchair Mobility    Modified Rankin (Stroke Patients Only)       Balance Overall balance assessment: Needs assistance Sitting-balance support: No upper extremity supported;Feet supported Sitting balance-Leahy Scale: Good     Standing balance support: Bilateral upper extremity supported Standing balance-Leahy Scale: Fair Standing balance comment: heavy B UE support on RW for static standing balance                             Pertinent Vitals/Pain Pain Location: pt very itchy b/l thighs, general baseline pain    Home Living Family/patient expects to be discharged to:: Skilled nursing facility                      Prior Function Level of Independence: Needs assistance         Comments: Pt indicates that he has been able to do some limited stepping in parallel bars, but cannot recall how long it has been since he was actually able to ambulate     Hand Dominance  Extremity/Trunk Assessment   Upper Extremity Assessment Upper Extremity Assessment: Generalized weakness    Lower Extremity Assessment Lower Extremity Assessment: Generalized weakness       Communication   Communication: HOH  Cognition Arousal/Alertness: Awake/alert Behavior During Therapy: WFL for tasks assessed/performed Overall Cognitive Status: Difficult to assess                                 General Comments: states name, DOB, today's date and that he was in the hospital but displayed very limited ability to recall any recent activity, asks "How long have I been here?" at least 5 times t/o session, pleasant but generally  confused      General Comments      Exercises     Assessment/Plan    PT Assessment Patient needs continued PT services  PT Problem List Decreased strength;Decreased mobility;Decreased safety awareness;Obesity;Decreased activity tolerance;Decreased cognition;Cardiopulmonary status limiting activity;Decreased skin integrity;Decreased balance;Decreased knowledge of use of DME       PT Treatment Interventions Therapeutic activities;DME instruction;Modalities;Therapeutic exercise;Gait training;Patient/family education;Balance training;Wheelchair mobility training;Functional mobility training;Neuromuscular re-education;Manual techniques    PT Goals (Current goals can be found in the Care Plan section)  Acute Rehab PT Goals Patient Stated Goal: none stated PT Goal Formulation: With patient Time For Goal Achievement: 06/04/21 Potential to Achieve Goals: Fair    Frequency Min 2X/week   Barriers to discharge        Co-evaluation               AM-PAC PT "6 Clicks" Mobility  Outcome Measure Help needed turning from your back to your side while in a flat bed without using bedrails?: A Little Help needed moving from lying on your back to sitting on the side of a flat bed without using bedrails?: A Lot Help needed moving to and from a bed to a chair (including a wheelchair)?: A Little Help needed standing up from a chair using your arms (e.g., wheelchair or bedside chair)?: A Lot Help needed to walk in hospital room?: A Lot Help needed climbing 3-5 steps with a railing? : Total 6 Click Score: 13    End of Session Equipment Utilized During Treatment: Gait belt (O2 in the mid/low 90s on 2L, remains ~90% on room air during mobility/activity)   Patient left: in bed;with call bell/phone within reach Nurse Communication: Mobility status PT Visit Diagnosis: Unsteadiness on feet (R26.81);Muscle weakness (generalized) (M62.81);Difficulty in walking, not elsewhere classified (R26.2)     Time: [id number] PT Time Calculation (min) (ACUTE ONLY): 34 min   Charges:   PT Evaluation $PT Eval Low Complexity: 1 Low PT Treatments $Therapeutic Activity: 8-22 mins        Kreg Shropshire, DPT 05/21/2021, 1:45 PM

## 2021-05-21 NOTE — ED Notes (Signed)
Pt profit misplaced- pt cleansed of urine and clean bedding placed

## 2021-05-21 NOTE — ED Notes (Signed)
Called lab to inquire about status of AM bloodwork- lab states that they had sent someone to ED to collect the bloodwork but did not have his and stated they would send someone else to collect it

## 2021-05-21 NOTE — ED Notes (Signed)
ED TO INPATIENT HANDOFF REPORT  ED Nurse Name and Phone #: [phone]  S Name/Age/Gender Christopher [name] 70 y.o. male Room/Bed: ED12A/ED12A  Code Status   Code Status: Full Code  Home/SNF/Other Skilled nursing facility Patient oriented to: self, place, time, and situation Is this baseline? Yes   Triage Complete: Triage complete  Chief Complaint Acute on chronic diastolic CHF (congestive heart failure) ([place]) [I50.33] Sepsis ([place]) [A41.9]  Triage Note Pt comes into the ED via [facility] from [facility].  Pt seen here yesterday for a fall and dx with hematoma and facial fracture.  Pt called out this morning for [facility].  Pt able to answer some questions, but not all.  Pt denies any problems at this time.  Pt does present with a lot of tremors.  Per facility, he is not acting at his baseline.   CBG 144 126/57 24 RR 95 % on 2L (baseline) 99.3 oral  Pt to ED [facility] from [facility] for shob that started today. Daughter reports pt is frequently here for high co2 Denies shob at this time Bruising noted to forehead, was evaluated yesterday for same after fall Pt disoriented to time, situation and place, daughter reports this is normal    Allergies Allergies  Allergen Reactions   Albuterol Other (See Comments)    Causes afib    Gramineae Pollens Other (See Comments)    Sneeze and watery eyes Sneeze and watery eyes   Other Other (See Comments)    Other reaction(s): Other (See Comments) Unable to breath when around cats Other reaction(s): Other (See Comments) Unable to breath when around cats    Level of Care/Admitting Diagnosis ED Disposition     ED Disposition  [place]: [place] [[id number]]  Level of Care: Progressive Cardiac [[id number]]  Admit to Progressive based on following criteria: MULTISYSTEM THREATS such as stable sepsis, metabolic/electrolyte imbalance with or without encephalopathy that is  responding to early treatment.  Covid Evaluation: Asymptomatic Screening Protocol (No Symptoms)  [facility]: [id number]  Admitting Physician: Gary Fleet  Attending Physician: Gary Fleet  Estimated length of stay: 3 - 4 days  Certification:: I certify this patient will need inpatient services for at least 2 midnights          B Medical/Surgery History Past Medical History:  Diagnosis Date   Anxiety    CHF (congestive heart failure) ([place])    COPD (chronic obstructive pulmonary disease) ([place])    Depression    Dysrhythmia    Hyperlipidemia    Hypertension    Hypothyroidism    Sleep apnea    Past Surgical History:  Procedure Laterality Date   FRACTURE SURGERY     ORIF ANKLE FRACTURE Left 12/17/2020   Procedure: OPEN REDUCTION INTERNAL FIXATION (ORIF) ANKLE FRACTURE;  Surgeon: Samara Deist, DPM;  Location: [facility] ORS;  Service: Podiatry;  Laterality: Left;     A IV Location/Drains/Wounds Patient Lines/Drains/Airways Status     Active Line/Drains/Airways     Name Placement date Placement time Site Days   Peripheral IV 05/20/21 18 G Right Antecubital 05/20/21  1119  Antecubital  1   Peripheral IV 05/20/21 20 G Left;Posterior Forearm 05/20/21  1445  Forearm  1   Pressure Injury 04/21/21 Buttocks Right;Left Stage 2 -  Partial thickness loss of dermis presenting as a shallow open injury with a red, pink wound bed without slough. 04/21/21  2040  -- 30  Pressure Injury 04/21/21 Thigh Left;Posterior Stage 2 -  Partial thickness loss of dermis presenting as a shallow open injury with a red, pink wound bed without slough. 04/21/21  2040  -- 30            Intake/Output Last 24 hours  Intake/Output Summary (Last 24 hours) at 05/21/2021 2008 Last data filed at 05/21/2021 [id number] Gross per 24 hour  Intake 100 ml  Output --  Net 100 ml    Labs/Imaging Results for orders placed or performed during the hospital encounter of 05/20/21 (from  the past 48 hour(s))  Culture, blood (routine x 2)     Status: None (Preliminary result)   Collection Time: 05/20/21 10:58 AM   Specimen: BLOOD  Result Value Ref Range   Specimen Description BLOOD RIGHT ANTECUBITAL    Special Requests      BOTTLES DRAWN AEROBIC AND ANAEROBIC Blood Culture results may not be optimal due to an excessive volume of blood received in culture bottles   Culture      NO GROWTH < 24 HOURS Performed at [facility], [address] [place], [place] [address]    Report Status PENDING   Culture, blood (routine x 2)     Status: None (Preliminary result)   Collection Time: 05/20/21 10:58 AM   Specimen: BLOOD  Result Value Ref Range   Specimen Description BLOOD BLOOD RIGHT ARM    Special Requests      BOTTLES DRAWN AEROBIC AND ANAEROBIC Blood Culture results may not be optimal due to an excessive volume of blood received in culture bottles   Culture      NO GROWTH < 24 HOURS Performed at [facility], [address] [place], [place] [address]    Report Status PENDING   Lactic acid, plasma     Status: None   Collection Time: 05/20/21 10:58 AM  Result Value Ref Range   Lactic Acid, Venous 1.5 0.5 - 1.9 mmol/L    Comment: Performed at [facility], [address] [place], [place] [address]  CBC with Differential     Status: Abnormal   Collection Time: 05/20/21 10:58 AM  Result Value Ref Range   WBC 11.0 (H) 4.0 - 10.5 K/uL   RBC 4.02 (L) 4.22 - 5.81 MIL/uL   Hemoglobin 11.2 (L) 13.0 - 17.0 g/dL   HCT 36.0 (L) 39.0 - 52.0 %   MCV 89.6 80.0 - 100.0 fL   MCH 27.9 26.0 - 34.0 pg   MCHC 31.1 30.0 - 36.0 g/dL   RDW 16.5 (H) 11.5 - 15.5 %   Platelets 183 150 - 400 K/uL   nRBC 0.0 0.0 - 0.2 %   Neutrophils Relative % 88 %   Neutro Abs 9.6 (H) 1.7 - 7.7 K/uL   Lymphocytes Relative 5 %   Lymphs Abs 0.6 (L) 0.7 - 4.0 K/uL   Monocytes Relative 6 %   Monocytes Absolute 0.7 0.1 - 1.0 K/uL   Eosinophils Relative 0 %   Eosinophils  Absolute 0.0 0.0 - 0.5 K/uL   Basophils Relative 0 %   Basophils Absolute 0.0 0.0 - 0.1 K/uL   Immature Granulocytes 1 %   Abs Immature Granulocytes 0.11 (H) 0.00 - 0.07 K/uL    Comment: Performed at [facility], [address] [place],  [address]  Comprehensive metabolic panel     Status: Abnormal   Collection Time: 05/20/21 10:58 AM  Result Value Ref Range   Sodium 140 135 -  145 mmol/L   Potassium 4.1 3.5 - 5.1 mmol/L   Chloride 97 (L) 98 - 111 mmol/L   CO2 31 22 - 32 mmol/L   Glucose, Bld 111 (H) 70 - 99 mg/dL    Comment: Glucose reference range applies only to samples taken after fasting for at least 8 hours.   BUN 22 8 - 23 mg/dL   Creatinine, Ser 2.21 (H) 0.61 - 1.24 mg/dL   Calcium 8.9 8.9 - 10.3 mg/dL   Total Protein 6.3 (L) 6.5 - 8.1 g/dL   Albumin 3.1 (L) 3.5 - 5.0 g/dL   AST 16 15 - 41 U/L   ALT 10 0 - 44 U/L   Alkaline Phosphatase 50 38 - 126 U/L   Total Bilirubin 1.0 0.3 - 1.2 mg/dL   GFR, Estimated 31 (L) >60 mL/min    Comment: (NOTE) Calculated using the CKD-EPI Creatinine Equation (2021)    Anion gap 12 5 - 15    Comment: Performed at [facility], [address] [place], [place] [address]  Brain natriuretic peptide     Status: Abnormal   Collection Time: 05/20/21 10:58 AM  Result Value Ref Range   B Natriuretic Peptide 242.5 (H) 0.0 - 100.0 pg/mL    Comment: Performed at [facility], [place], [place] [address]  Troponin I (High Sensitivity)     Status: Abnormal   Collection Time: 05/20/21 10:58 AM  Result Value Ref Range   Troponin I (High Sensitivity) 20 (H) <18 ng/L    Comment: (NOTE) Elevated high sensitivity troponin I (hsTnI) values and significant  changes across serial measurements may suggest ACS but many other  chronic and acute conditions are known to elevate hsTnI results.  Refer to the "Links" section for chest pain algorithms and additional  guidance. Performed at [facility],  [address] [place], [place] [address]   Procalcitonin - Baseline     Status: None   Collection Time: 05/20/21 10:58 AM  Result Value Ref Range   Procalcitonin 0.25 ng/mL    Comment:        Interpretation: PCT (Procalcitonin) <= 0.5 ng/mL: Systemic infection (sepsis) is not likely. Local bacterial infection is possible. (NOTE)       Sepsis PCT Algorithm           Lower Respiratory Tract                                      Infection PCT Algorithm    ----------------------------     ----------------------------         PCT < 0.25 ng/mL                PCT < 0.10 ng/mL          Strongly encourage             Strongly discourage   discontinuation of antibiotics    initiation of antibiotics    ----------------------------     -----------------------------       PCT 0.25 - 0.50 ng/mL            PCT 0.10 - 0.25 ng/mL               OR       >80% decrease in PCT            Discourage initiation of  antibiotics      Encourage discontinuation           of antibiotics    ----------------------------     -----------------------------         PCT >= 0.50 ng/mL              PCT 0.26 - 0.50 ng/mL               AND        <80% decrease in PCT             Encourage initiation of                                             antibiotics       Encourage continuation           of antibiotics    ----------------------------     -----------------------------        PCT >= 0.50 ng/mL                  PCT > 0.50 ng/mL               AND         increase in PCT                  Strongly encourage                                      initiation of antibiotics    Strongly encourage escalation           of antibiotics                                     -----------------------------                                           PCT <= 0.25 ng/mL                                                 OR                                        > 80% decrease in PCT                                       Discontinue / Do not initiate                                             antibiotics  Performed at [facility], [address] [place], [place] [address]   Blood gas, venous     Status: Abnormal (Preliminary result)  Collection Time: 05/20/21 11:01 AM  Result Value Ref Range   pH, Ven 7.44 (H) 7.250 - 7.430   pCO2, Ven 53 44.0 - 60.0 mmHg   pO2, Ven PENDING 32.0 - 45.0 mmHg   Bicarbonate 36.0 (H) 20.0 - 28.0 mmol/L   Acid-Base Excess 10.2 (H) 0.0 - 2.0 mmol/L   O2 Saturation 45.7 %   Patient temperature 37.0    Collection site VEIN    Sample type VEIN     Comment: Performed at [facility], [address] [place], [place] [address]  Resp Panel by RT-PCR (Flu A&B, Covid) Nasopharyngeal Swab     Status: None   Collection Time: 05/20/21 11:07 AM   Specimen: Nasopharyngeal Swab; Nasopharyngeal(NP) swabs in vial transport medium  Result Value Ref Range   SARS Coronavirus 2 by RT PCR NEGATIVE NEGATIVE    Comment: (NOTE) SARS-CoV-2 target nucleic acids are NOT DETECTED.  The SARS-CoV-2 RNA is generally detectable in upper respiratory specimens during the acute phase of infection. The lowest concentration of SARS-CoV-2 viral copies this assay can detect is 138 copies/mL. A negative result does not preclude SARS-Cov-2 infection and should not be used as the sole basis for treatment or other patient management decisions. A negative result may occur with  improper specimen collection/handling, submission of specimen other than nasopharyngeal swab, presence of viral mutation(s) within the areas targeted by this assay, and inadequate number of viral copies(<138 copies/mL). A negative result must be combined with clinical observations, patient history, and epidemiological information. The expected result is Negative.  Fact Sheet for Patients:  EntrepreneurPulse.com.au  Fact Sheet for Healthcare Providers:   IncredibleEmployment.be  This test is no t yet approved or cleared by the [place] FDA and  has been authorized for detection and/or diagnosis of SARS-CoV-2 by FDA under an Emergency Use Authorization (EUA). This EUA will remain  in effect (meaning this test can be used) for the duration of the COVID-19 declaration under Section 564(b)(1) of the Act, 21 U.S.C.section 360bbb-3(b)(1), unless the authorization is terminated  or revoked sooner.       Influenza A by PCR NEGATIVE NEGATIVE   Influenza B by PCR NEGATIVE NEGATIVE    Comment: (NOTE) The Xpert Xpress SARS-CoV-2/FLU/RSV plus assay is intended as an aid in the diagnosis of influenza from Nasopharyngeal swab specimens and should not be used as a sole basis for treatment. Nasal washings and aspirates are unacceptable for Xpert Xpress SARS-CoV-2/FLU/RSV testing.  Fact Sheet for Patients: EntrepreneurPulse.com.au  Fact Sheet for Healthcare Providers: IncredibleEmployment.be  This test is not yet approved or cleared by the [place] FDA and has been authorized for detection and/or diagnosis of SARS-CoV-2 by FDA under an Emergency Use Authorization (EUA). This EUA will remain in effect (meaning this test can be used) for the duration of the COVID-19 declaration under Section 564(b)(1) of the Act, 21 U.S.C. section 360bbb-3(b)(1), unless the authorization is terminated or revoked.  Performed at [facility], [address] [place], [place] [address]   Urinalysis, Complete w Microscopic     Status: Abnormal   Collection Time: 05/20/21  1:33 PM  Result Value Ref Range   Color, Urine STRAW (A) YELLOW   APPearance CLEAR (A) CLEAR   Specific Gravity, Urine 1.008 1.005 - 1.030   pH 7.0 5.0 - 8.0   Glucose, UA NEGATIVE NEGATIVE mg/dL   Hgb urine dipstick NEGATIVE NEGATIVE   Bilirubin Urine NEGATIVE NEGATIVE   Ketones, ur NEGATIVE NEGATIVE mg/dL    Protein,  ur NEGATIVE NEGATIVE mg/dL   Nitrite NEGATIVE NEGATIVE   Leukocytes,Ua NEGATIVE NEGATIVE   RBC / HPF 0-5 0 - 5 RBC/hpf   WBC, UA 0-5 0 - 5 WBC/hpf   Bacteria, UA NONE SEEN NONE SEEN   Squamous Epithelial / LPF NONE SEEN 0 - 5    Comment: Performed at [facility], [place], [place] [address]  Troponin I (High Sensitivity)     Status: Abnormal   Collection Time: 05/20/21  1:33 PM  Result Value Ref Range   Troponin I (High Sensitivity) 24 (H) <18 ng/L    Comment: (NOTE) Elevated high sensitivity troponin I (hsTnI) values and significant  changes across serial measurements may suggest ACS but many other  chronic and acute conditions are known to elevate hsTnI results.  Refer to the "Links" section for chest pain algorithms and additional  guidance. Performed at [facility], [address] [place], [place] [address]   Blood gas, arterial     Status: Abnormal   Collection Time: 05/20/21  2:16 PM  Result Value Ref Range   FIO2 0.28    Delivery systems NASAL CANNULA    pH, Arterial 7.50 (H) 7.350 - 7.450   pCO2 arterial 42 32.0 - 48.0 mmHg   pO2, Arterial 117 (H) 83.0 - 108.0 mmHg   Bicarbonate 32.5 (H) 20.0 - 28.0 mmol/L   Acid-Base Excess 9.3 (H) 0.0 - 2.0 mmol/L   O2 Saturation 98.5 %   Patient temperature 39.4    Collection site RIGHT RADIAL    Sample type ARTERIAL DRAW    Allens test (pass/fail) POSITIVE (A) PASS    Comment: Performed at [facility], [address] [place], [place] [address]  Lactic acid, plasma     Status: None   Collection Time: 05/20/21  8:07 PM  Result Value Ref Range   Lactic Acid, Venous 0.8 0.5 - 1.9 mmol/L    Comment: Performed at [facility], [address] [place], [place] [address]  MRSA Next Gen by PCR, Nasal     Status: None   Collection Time: 05/21/21  6:33 AM   Specimen: Nasal Mucosa; Nasal Swab  Result Value Ref Range   MRSA by PCR Next Gen NOT DETECTED NOT DETECTED     Comment: (NOTE) The GeneXpert MRSA Assay (FDA approved for NASAL specimens only), is one component of a comprehensive MRSA colonization surveillance program. It is not intended to diagnose MRSA infection nor to guide or monitor treatment for MRSA infections. Test performance is not FDA approved in patients less than 36 years old. Performed at [facility], [address] [place], [place] [address]   Protime-INR     Status: Abnormal   Collection Time: 05/21/21  8:43 AM  Result Value Ref Range   Prothrombin Time 15.4 (H) 11.4 - 15.2 seconds   INR 1.2 0.8 - 1.2    Comment: (NOTE) INR goal varies based on device and disease states. Performed at [facility], [address] [place], [place] [address]   Cortisol-am, blood     Status: None   Collection Time: 05/21/21  8:43 AM  Result Value Ref Range   Cortisol - AM 18.3 6.7 - 22.6 ug/dL    Comment: Performed at [facility] [address], [place],  [address]  Procalcitonin     Status: None   Collection Time: 05/21/21  8:43 AM  Result Value Ref Range   Procalcitonin 0.42 ng/mL  Comment:        Interpretation: PCT (Procalcitonin) <= 0.5 ng/mL: Systemic infection (sepsis) is not likely. Local bacterial infection is possible. (NOTE)       Sepsis PCT Algorithm           Lower Respiratory Tract                                      Infection PCT Algorithm    ----------------------------     ----------------------------         PCT < 0.25 ng/mL                PCT < 0.10 ng/mL          Strongly encourage             Strongly discourage   discontinuation of antibiotics    initiation of antibiotics    ----------------------------     -----------------------------       PCT 0.25 - 0.50 ng/mL            PCT 0.10 - 0.25 ng/mL               OR       >80% decrease in PCT            Discourage initiation of                                            antibiotics      Encourage discontinuation           of  antibiotics    ----------------------------     -----------------------------         PCT >= 0.50 ng/mL              PCT 0.26 - 0.50 ng/mL               AND        <80% decrease in PCT             Encourage initiation of                                             antibiotics       Encourage continuation           of antibiotics    ----------------------------     -----------------------------        PCT >= 0.50 ng/mL                  PCT > 0.50 ng/mL               AND         increase in PCT                  Strongly encourage                                      initiation of antibiotics    Strongly encourage escalation           of antibiotics                                     -----------------------------  PCT <= 0.25 ng/mL                                                 OR                                        > 80% decrease in PCT                                      Discontinue / Do not initiate                                             antibiotics  Performed at [facility], [address] [place], [place] [address]   CBC     Status: Abnormal   Collection Time: 05/21/21  8:43 AM  Result Value Ref Range   WBC 9.0 4.0 - 10.5 K/uL   RBC 3.66 (L) 4.22 - 5.81 MIL/uL   Hemoglobin 10.2 (L) 13.0 - 17.0 g/dL   HCT 32.8 (L) 39.0 - 52.0 %   MCV 89.6 80.0 - 100.0 fL   MCH 27.9 26.0 - 34.0 pg   MCHC 31.1 30.0 - 36.0 g/dL   RDW 16.8 (H) 11.5 - 15.5 %   Platelets 142 (L) 150 - 400 K/uL   nRBC 0.0 0.0 - 0.2 %    Comment: Performed at [facility], [address] [place], [place] [id number]  Basic metabolic panel     Status: Abnormal   Collection Time: 05/21/21  8:43 AM  Result Value Ref Range   Sodium 139 135 - 145 mmol/L   Potassium 4.0 3.5 - 5.1 mmol/L   Chloride 95 (L) 98 - 111 mmol/L   CO2 33 (H) 22 - 32 mmol/L   Glucose, Bld 105 (H) 70 - 99 mg/dL    Comment: Glucose reference range applies only to samples  taken after fasting for at least 8 hours.   BUN 28 (H) 8 - 23 mg/dL   Creatinine, Ser 2.43 (H) 0.61 - 1.24 mg/dL   Calcium 8.3 (L) 8.9 - 10.3 mg/dL   GFR, Estimated 28 (L) >60 mL/min    Comment: (NOTE) Calculated using the CKD-EPI Creatinine Equation (2021)    Anion gap 11 5 - 15    Comment: Performed at [facility], [address] [place], [place] [address]  Lactic acid, plasma     Status: None   Collection Time: 05/21/21  8:43 AM  Result Value Ref Range   Lactic Acid, Venous 0.9 0.5 - 1.9 mmol/L    Comment: Performed at [facility], [address] [place], [place] [address]   DG Chest 2 View  Result Date: 05/20/2021 CLINICAL DATA:  SOB EXAM: CHEST - 2 VIEW COMPARISON:  August 29 FINDINGS: The heart is enlarged. No pleural effusion. No pneumothorax. Prominence of the pulmonary interstitium. No lobar consolidation. No acute osseous abnormality. IMPRESSION: Cardiomegaly and pulmonary vascular congestion. No effusion or lobar consolidation. Electronically Signed   By: Albin Felling M.D.   On: 05/20/2021 11:56  CT Head Wo Contrast  Result Date: 05/20/2021 CLINICAL DATA:  Mental status changes of unknown cause in a 70 year old male. EXAM: CT HEAD WITHOUT CONTRAST TECHNIQUE: Contiguous axial images were obtained from the base of the skull through the vertex without intravenous contrast. COMPARISON:  May 19, 2021. FINDINGS: Brain: No evidence of acute infarction, hemorrhage, hydrocephalus, extra-axial collection or mass lesion/mass effect. Signs of atrophy and chronic microvascular ischemic change with similar appearance to prior imaging. Encephalomalacia in the RIGHT posterior parietal region is stable Vascular: No hyperdense vessel or unexpected calcification. Skull: Normal. Negative for fracture or focal lesion. Sinuses/Orbits: No acute finding. Other: Subtle LEFT paramidline frontal scalp soft tissue swelling IMPRESSION: No acute intracranial abnormality. Signs of  atrophy and chronic microvascular ischemic change with similar appearance to prior imaging. Mild soft tissue swelling over the LEFT paramidline scalp. Electronically Signed   By: Zetta Bills M.D.   On: 05/20/2021 16:07   CT CHEST WO CONTRAST  Result Date: 05/20/2021 CLINICAL DATA:  Pneumonia, effusion or abscess suspected, xray done EXAM: CT CHEST WITHOUT CONTRAST TECHNIQUE: Multidetector CT imaging of the chest was performed following the standard protocol without IV contrast. COMPARISON:  Chest radiograph earlier same day FINDINGS: Cardiovascular: The heart appears at the upper limit of normal. No pericardial effusion. The thoracic aorta is normal in caliber. The pulmonary artery is enlarged at 43 mm. Mediastinum/Nodes: No enlarged mediastinal, hilar, or axillary lymph nodes. The thyroid gland appears normal. Lungs/Pleura: No pleural effusion. No pneumothorax. No mass or focal consolidation. No suspicious pulmonary nodules. Musculoskeletal: No aggressive osseous lesions. Upper abdomen: The visualized upper abdomen is unremarkable. IMPRESSION: The pulmonary artery is enlarged which may represent underlying pulmonary artery hypertension/pulmonary vascular congestion. No findings of overt pulmonary edema, consolidative pneumonia, or pleural effusion. These results were called by telephone at the time of interpretation on 05/20/2021 at 3:40 pm to provider [facility] , who verbally acknowledged these results. Electronically Signed   By: Albin Felling M.D.   On: 05/20/2021 15:40   US Carotid Bilateral  Result Date: 05/21/2021 CLINICAL DATA:  Syncope EXAM: BILATERAL CAROTID DUPLEX ULTRASOUND TECHNIQUE: Pearline Cables scale imaging, color Doppler and duplex ultrasound were performed of bilateral carotid and vertebral arteries in the neck. COMPARISON:  None. FINDINGS: Criteria: Quantification of carotid stenosis is based on velocity parameters that correlate the residual internal carotid diameter with NASCET-based  stenosis levels, using the diameter of the distal internal carotid lumen as the denominator for stenosis measurement. The following velocity measurements were obtained: RIGHT ICA: 120/15 cm/sec CCA: [id number] cm/sec SYSTOLIC ICA/CCA RATIO:  1.0 ECA: 132 cm/sec LEFT ICA: 124/25 cm/sec CCA: [id number] cm/sec SYSTOLIC ICA/CCA RATIO:  0.9 ECA: 120 cm/sec RIGHT CAROTID ARTERY: No significant atheromatous plaque. RIGHT VERTEBRAL ARTERY:  Antegrade flow. LEFT CAROTID ARTERY:  No significant atheromatous plaque. LEFT VERTEBRAL ARTERY:  Antegrade flow. IMPRESSION: Less than 50% stenosis of the internal carotid arteries. Electronically Signed   By: Miachel Roux M.D.   On: 05/21/2021 17:01   US Venous Img Lower Bilateral (DVT)  Result Date: 05/20/2021 CLINICAL DATA:  70 year old male with bilateral leg pain and edema EXAM: BILATERAL LOWER EXTREMITY VENOUS DOPPLER ULTRASOUND TECHNIQUE: Gray-scale sonography with graded compression, as well as color Doppler and duplex ultrasound were performed to evaluate the lower extremity deep venous systems from the level of the common femoral vein and including the common femoral, femoral, profunda femoral, popliteal and calf veins including the posterior tibial, peroneal and gastrocnemius veins when visible. The superficial great saphenous vein was also  interrogated. Spectral Doppler was utilized to evaluate flow at rest and with distal augmentation maneuvers in the common femoral, femoral and popliteal veins. COMPARISON:  None. FINDINGS: RIGHT LOWER EXTREMITY Common Femoral Vein: No evidence of thrombus. Normal compressibility, respiratory phasicity and response to augmentation. Saphenofemoral Junction: No evidence of thrombus. Normal compressibility and flow on color Doppler imaging. Profunda Femoral Vein: No evidence of thrombus. Normal compressibility and flow on color Doppler imaging. Femoral Vein: No evidence of thrombus. Normal compressibility, respiratory phasicity and response to  augmentation. Popliteal Vein: No evidence of thrombus. Normal compressibility, respiratory phasicity and response to augmentation. Calf Veins: No evidence of thrombus. Normal compressibility and flow on color Doppler imaging. Superficial Great Saphenous Vein: No evidence of thrombus. Normal compressibility and flow on color Doppler imaging. Other Findings:  None. LEFT LOWER EXTREMITY Common Femoral Vein: No evidence of thrombus. Normal compressibility, respiratory phasicity and response to augmentation. Saphenofemoral Junction: No evidence of thrombus. Normal compressibility and flow on color Doppler imaging. Profunda Femoral Vein: No evidence of thrombus. Normal compressibility and flow on color Doppler imaging. Femoral Vein: No evidence of thrombus. Normal compressibility, respiratory phasicity and response to augmentation. Popliteal Vein: No evidence of thrombus. Normal compressibility, respiratory phasicity and response to augmentation. Calf Veins: No evidence of thrombus. Normal compressibility and flow on color Doppler imaging. Superficial Great Saphenous Vein: No evidence of thrombus. Normal compressibility and flow on color Doppler imaging. Other Findings:  None. IMPRESSION: Sonographic survey of the bilateral lower extremities negative for DVT Electronically Signed   By: Corrie Mckusick D.O.   On: 05/20/2021 16:12    Pending Labs Unresulted Labs (From admission, onward)     Start     Ordered   05/22/21 [id number]  Basic metabolic panel  Tomorrow morning,   STAT        05/21/21 0930            Vitals/Pain Today's Vitals   05/21/21 1900 05/21/21 1909 05/21/21 1930 05/21/21 2000  BP: 100/67  (!) 110/57 98/68  Pulse: 89  84 83  Resp: 19  18 (!) 21  Temp:      TempSrc:      SpO2: 96%  98% 100%  Weight:      Height:      PainSc:  Asleep      Isolation Precautions No active isolations  Medications Medications  acetaminophen (TYLENOL) tablet 650 mg (650 mg Oral Given 05/21/21 1704)   aspirin EC tablet 81 mg (81 mg Oral Given 05/20/21 2148)  atorvastatin (LIPITOR) tablet 20 mg (20 mg Oral Given 05/20/21 2148)  midodrine (PROAMATINE) tablet 5 mg (5 mg Oral Given 05/21/21 1704)  buPROPion (WELLBUTRIN SR) 12 hr tablet 150 mg (150 mg Oral Given 05/21/21 1914)  levothyroxine (SYNTHROID) tablet 88 mcg (88 mcg Oral Given 05/21/21 0629)  vitamin B-12 (CYANOCOBALAMIN) tablet 1,000 mcg (1,000 mcg Oral Given 05/20/21 2149)  cholecalciferol (VITAMIN D) tablet 1,000 Units (1,000 Units Oral Given 05/20/21 2149)  protein supplement (ENSURE MAX) liquid (11 oz Oral Not Given 05/21/21 1123)  multivitamin-lutein (OCUVITE-LUTEIN) capsule 1 capsule (1 capsule Oral Given 05/21/21 1914)  levalbuterol (XOPENEX) nebulizer solution 0.63 mg (has no administration in time range)  lactated ringers infusion (0 mLs Intravenous Stopped 05/21/21 0110)  ceFEPIme (MAXIPIME) 2 g in sodium chloride 0.9 % 100 mL IVPB (0 g Intravenous Stopped 05/21/21 1910)  metroNIDAZOLE (FLAGYL) IVPB 500 mg (0 mg Intravenous Stopped 05/21/21 1808)  acetaminophen (TYLENOL) tablet 650 mg (has no administration in time range)  Or  acetaminophen (TYLENOL) suppository 650 mg (has no administration in time range)  ondansetron (ZOFRAN) tablet 4 mg (has no administration in time range)    Or  ondansetron (ZOFRAN) injection 4 mg (has no administration in time range)  enoxaparin (LOVENOX) injection 75 mg (75 mg Subcutaneous Given 05/20/21 2148)  0.9 %  sodium chloride infusion ( Intravenous New Bag/Given 05/21/21 1652)  furosemide (LASIX) injection 80 mg (80 mg Intravenous Given 05/20/21 1216)  ceFEPIme (MAXIPIME) 2 g in sodium chloride 0.9 % 100 mL IVPB (0 g Intravenous Stopped 05/20/21 1624)  metroNIDAZOLE (FLAGYL) IVPB 500 mg (0 mg Intravenous Stopped 05/20/21 1624)  vancomycin (VANCOCIN) IVPB 1000 mg/200 mL premix (0 mg Intravenous Stopped 05/20/21 1624)  vancomycin (VANCOREADY) IVPB 1500 mg/300 mL (0 mg Intravenous Stopped 05/20/21 1915)     Mobility walks with person assist High fall risk   Focused Assessments   R Recommendations: See Admitting Provider Note  Report given to:   Additional Notes: can use the urinal with help / 2LPM chronic / recent fall with facial fx /

## 2021-05-21 NOTE — ED Notes (Signed)
Pt placed on bedpan to have a BM

## 2021-05-21 NOTE — ED Notes (Signed)
Pt repositioned in bed.

## 2021-05-21 NOTE — ED Notes (Signed)
Attempted to call pt's daughter x2 with update, no answer

## 2021-05-22 ENCOUNTER — Inpatient Hospital Stay: Payer: Medicare Other

## 2021-05-22 DIAGNOSIS — Z6841 Body Mass Index (BMI) 40.0 and over, adult: Secondary | ICD-10-CM

## 2021-05-22 DIAGNOSIS — I959 Hypotension, unspecified: Secondary | ICD-10-CM

## 2021-05-22 LAB — BASIC METABOLIC PANEL
Anion gap: 10 (ref 5–15)
BUN: 36 mg/dL — ABNORMAL HIGH (ref 8–23)
CO2: 33 mmol/L — ABNORMAL HIGH (ref 22–32)
Calcium: 8.1 mg/dL — ABNORMAL LOW (ref 8.9–10.3)
Chloride: 97 mmol/L — ABNORMAL LOW (ref 98–111)
Creatinine, Ser: 2.28 mg/dL — ABNORMAL HIGH (ref 0.61–1.24)
GFR, Estimated: 30 mL/min — ABNORMAL LOW (ref >60)
Glucose, Bld: 91 mg/dL (ref 70–99)
Potassium: 3.6 mmol/L (ref 3.5–5.1)
Sodium: 140 mmol/L (ref 135–145)

## 2021-05-22 MED ORDER — SODIUM CHLORIDE 0.9 % IV SOLN
2.0000 g | INTRAVENOUS | Status: DC
  Administered 2021-05-22: 2 g via INTRAVENOUS
  Filled 2021-05-22: qty 20
  Filled 2021-05-22: qty 2

## 2021-05-22 MED ORDER — MIDODRINE HCL 5 MG PO TABS
10.0000 mg | ORAL_TABLET | Freq: Three times a day (TID) | ORAL | Status: DC
  Administered 2021-05-22 – 2021-05-25 (×9): 10 mg via ORAL
  Filled 2021-05-22 (×9): qty 2

## 2021-05-22 MED ORDER — HYDROXYZINE HCL 10 MG PO TABS
10.0000 mg | ORAL_TABLET | Freq: Three times a day (TID) | ORAL | Status: DC | PRN
  Administered 2021-05-23 – 2021-05-25 (×4): 10 mg via ORAL
  Filled 2021-05-22 (×5): qty 1

## 2021-05-22 NOTE — NC FL2 (Signed)
[place] LEVEL OF CARE SCREENING TOOL     IDENTIFICATION  Patient Name: Christopher [name] Birthdate: March 14, 1951 Sex: male Admission Date (Current Location): 05/20/2021  [facility] and [place] Number:  Engineering geologist and Address:  [facility], [address], [place], [place] [address]      Provider Number: [id number]  Attending Physician Name and Address:  Loletha Grayer, MD  Relative Name and Phone Number:  [name] (daughter) [phone]    Current Level of Care: Hospital Recommended Level of Care: [place] Prior Approval Number:    Date Approved/Denied:   PASRR Number: [id number]  Discharge Plan: SNF    Current Diagnoses: Patient Active Problem List   Diagnosis Date Noted   SIRS (systemic inflammatory response syndrome) (HCC)    AF (paroxysmal atrial fibrillation) ([place])    Syncope    Sepsis ([place]) 05/20/2021   Pressure injury of skin 04/25/2021   Acute respiratory failure ([place]) 04/21/2021   Acute kidney injury superimposed on CKD ([place]) 04/21/2021   Acute on chronic respiratory failure with hypoxia and hypercapnia ([place]) Q000111Q   Acute metabolic encephalopathy Q000111Q   CKD (chronic kidney disease), stage IIIa 03/22/2021   Leukocytosis 03/22/2021   Elevated troponin 03/22/2021   Open wound of left foot 03/22/2021   Osteomyelitis of ankle or foot, acute, left ([place]) 03/17/2021   Acute on chronic respiratory failure ([place]) 03/17/2021   Osteomyelitis ([place]) 12/16/2020   Closed left ankle fracture 12/16/2020   Depression 12/16/2020   Medicare annual wellness visit, initial 11/14/2019   B12 deficiency 11/14/2019   Hyperlipidemia 10/31/2019   Primary osteoarthritis of right knee 03/16/2019   Sleep apnea 10/31/2018   Traumatic incomplete tear of left rotator cuff 10/02/2018   Morbid obesity with BMI of 45.0-49.9, adult ([place]) 09/20/2018   Hypothyroidism 09/20/2018   Chronic diastolic CHF (congestive heart  failure) ([place]) 09/20/2018   Benign essential hypertension 09/20/2018    Orientation RESPIRATION BLADDER Height & Weight     Self, Place  O2 (3L nasal cannula) Continent, External catheter Weight: (!) 332 lb 7.3 oz (150.8 kg) Height:  6' (182.9 cm)  BEHAVIORAL SYMPTOMS/MOOD NEUROLOGICAL BOWEL NUTRITION STATUS      Continent Diet (see discharge summary)  AMBULATORY STATUS COMMUNICATION OF NEEDS Skin   Extensive Assist Verbally Other (Comment) (pressure injury buttocks and left thigh stage 2)                       Personal Care Assistance Level of Assistance  Bathing, Feeding, Dressing, Total care Bathing Assistance: Limited assistance Feeding assistance: Limited assistance Dressing Assistance: Limited assistance Total Care Assistance: Maximum assistance   Functional Limitations Info  Sight, Hearing, Speech Sight Info: Impaired Hearing Info: Impaired Speech Info: Adequate    SPECIAL CARE FACTORS FREQUENCY  PT (By licensed PT), OT (By licensed OT)     PT Frequency: min 4x weekly OT Frequency: min 4x weekly            Contractures Contractures Info: Not present    Additional Factors Info  Code Status, Allergies Code Status Info: full Allergies Info: albuterol, gramineae pollens           Current Medications (05/22/2021):  This is the current hospital active medication list Current Facility-Administered Medications  Medication Dose Route Frequency Provider Last Rate Last Admin   0.9 %  sodium chloride infusion   Intravenous Continuous Loletha Grayer, MD 50 mL/hr at 05/21/21 2227 New Bag at 05/21/21 2227  acetaminophen (TYLENOL) tablet 650 mg  650 mg Oral Q6H PRN Agbata, Tochukwu, MD       Or   acetaminophen (TYLENOL) suppository 650 mg  650 mg Rectal Q6H PRN Agbata, Tochukwu, MD       acetaminophen (TYLENOL) tablet 650 mg  650 mg Oral Q6H PRN Agbata, Tochukwu, MD   650 mg at 05/21/21 1704   aspirin EC tablet 81 mg  81 mg Oral QHS Agbata, Tochukwu, MD   81 mg  at 05/21/21 2243   atorvastatin (LIPITOR) tablet 20 mg  20 mg Oral QHS Agbata, Tochukwu, MD   20 mg at 05/21/21 2243   buPROPion (WELLBUTRIN SR) 12 hr tablet 150 mg  150 mg Oral BID Agbata, Tochukwu, MD   150 mg at 05/22/21 0933   cefTRIAXone (ROCEPHIN) 2 g in sodium chloride 0.9 % 100 mL IVPB  2 g Intravenous Q24H Wieting, Richard, MD       cholecalciferol (VITAMIN D) tablet 1,000 Units  1,000 Units Oral QHS Agbata, Tochukwu, MD   1,000 Units at 05/21/21 2243   enoxaparin (LOVENOX) injection 75 mg  0.5 mg/kg Subcutaneous Q24H Agbata, Tochukwu, MD   75 mg at 05/21/21 2243   levalbuterol (XOPENEX) nebulizer solution 0.63 mg  0.63 mg Nebulization Q6H PRN Agbata, Tochukwu, MD       levothyroxine (SYNTHROID) tablet 88 mcg  88 mcg Oral Daily Agbata, Tochukwu, MD   88 mcg at 05/22/21 0640   midodrine (PROAMATINE) tablet 5 mg  5 mg Oral TID WC Agbata, Tochukwu, MD   5 mg at 05/22/21 0933   multivitamin-lutein (OCUVITE-LUTEIN) capsule 1 capsule  1 capsule Oral BID Agbata, Tochukwu, MD   1 capsule at 05/22/21 0933   ondansetron (ZOFRAN) tablet 4 mg  4 mg Oral Q6H PRN Agbata, Tochukwu, MD       Or   ondansetron (ZOFRAN) injection 4 mg  4 mg Intravenous Q6H PRN Agbata, Tochukwu, MD       protein supplement (ENSURE MAX) liquid  11 oz Oral BID Agbata, Tochukwu, MD   11 oz at 05/21/21 2244   vitamin B-12 (CYANOCOBALAMIN) tablet 1,000 mcg  1,000 mcg Oral Once per day on Sun Wed Agbata, Tochukwu, MD   1,000 mcg at 05/20/21 2149     Discharge Medications: Please see discharge summary for a list of discharge medications.  Relevant Imaging Results:  Relevant Lab Results:   Additional Information SSN: [id number]  Alberteen Sam, LCSW

## 2021-05-22 NOTE — TOC Initial Note (Signed)
Transition of Care [facility]) - Initial/Assessment Note    Patient Details  Name: Christopher [name] MRN: [id number] Date of Birth: 04/04/1951  [facility] CM/SW Contact:    Alberteen Sam, LCSW Phone Number: 05/22/2021, 10:08 AM  Clinical Narrative:                  CSW spoke with patient's daughter [name], confirms patient is long term at [facility] with plan to return at time of discharge.  Reports patient's wife is in room 113 also with SNF rec, CSW has informed that patient's Education officer, museum.   Plan to return to [facility] when medically stable.     Expected Discharge Plan: Skilled Nursing Facility Barriers to Discharge: Continued Medical Work up   Patient Goals and CMS Choice Patient states their goals for this hospitalization and ongoing recovery are:: to go home CMS Medicare.gov Compare Post Acute Care list provided to:: Patient Represenative (must comment) Choice offered to / list presented to : Adult Children  Expected Discharge Plan and Services Expected Discharge Plan: [place] Acute Care Choice: [place] Living arrangements for the past 2 months: Single Family Home                                      Prior Living Arrangements/Services Living arrangements for the past 2 months: Single Family Home Lives with:: Self, Facility Resident Patient language and need for interpreter reviewed:: Yes Do you feel safe going back to the place where you live?: Yes      Need for Family Participation in Patient Care: Yes (Comment) Care giver support system in place?: Yes (comment)   Criminal Activity/Legal Involvement Pertinent to Current Situation/Hospitalization: No - Comment as needed  Activities of Daily Living Home Assistive Devices/Equipment: CPAP, Dentures (specify type), Eyeglasses, [employer], Oxygen, Wheelchair ADL Screening (condition at time of admission) Patient's cognitive ability adequate to safely  complete daily activities?: No Is the patient deaf or have difficulty hearing?: Yes Does the patient have difficulty seeing, even when wearing glasses/contacts?: No Does the patient have difficulty concentrating, remembering, or making decisions?: Yes Patient able to express need for assistance with ADLs?: Yes Does the patient have difficulty dressing or bathing?: Yes Independently performs ADLs?: No Communication: Independent Dressing (OT): Needs assistance Is this a change from baseline?: Pre-admission baseline Grooming: Needs assistance Is this a change from baseline?: Pre-admission baseline Feeding: Independent Bathing: Needs assistance Is this a change from baseline?: Pre-admission baseline Toileting: Needs assistance Is this a change from baseline?: Pre-admission baseline In/Out Bed: Dependent Is this a change from baseline?: Pre-admission baseline Does the patient have difficulty walking or climbing stairs?: Yes Weakness of Legs: Both Weakness of Arms/Hands: None  Permission Sought/Granted Permission sought to share information with : Case Manager, Customer service manager, Family Supports Permission granted to share information with : Yes, Verbal Permission Granted  Share Information with NAME: [name]  Permission granted to share info w AGENCY: SNFs  Permission granted to share info w Relationship: daughter  Permission granted to share info w Contact Information: [phone]  Emotional Assessment Appearance:: Appears stated age Attitude/Demeanor/Rapport: Gracious Affect (typically observed): Calm Orientation: : Oriented to Self, Oriented to Place, Oriented to  Time, Oriented to Situation Alcohol / Substance Use: Not Applicable Psych Involvement: No (comment)  Admission diagnosis:  Syncope [R55] DVT (deep venous thrombosis) (HCC) [I82.409] Bilateral leg  pain [M79.604, M79.605] Bilateral leg edema [R60.0] Fever [R50.9] Acute on chronic diastolic congestive  heart failure (HCC) [I50.33] Acute on chronic diastolic CHF (congestive heart failure) ([place]) [I50.33] Sepsis ([place]) [A41.9] Sepsis without acute organ dysfunction, due to unspecified organism [facility]) [A41.9] Patient Active Problem List   Diagnosis Date Noted   SIRS (systemic inflammatory response syndrome) (HCC)    AF (paroxysmal atrial fibrillation) ([place])    Syncope    Sepsis ([place]) 05/20/2021   Pressure injury of skin 04/25/2021   Acute respiratory failure ([place]) 04/21/2021   Acute kidney injury superimposed on CKD ([place]) 04/21/2021   Acute on chronic respiratory failure with hypoxia and hypercapnia ([place]) Q000111Q   Acute metabolic encephalopathy Q000111Q   CKD (chronic kidney disease), stage IIIa 03/22/2021   Leukocytosis 03/22/2021   Elevated troponin 03/22/2021   Open wound of left foot 03/22/2021   Osteomyelitis of ankle or foot, acute, left ([place]) 03/17/2021   Acute on chronic respiratory failure ([place]) 03/17/2021   Osteomyelitis ([place]) 12/16/2020   Closed left ankle fracture 12/16/2020   Depression 12/16/2020   Medicare annual wellness visit, initial 11/14/2019   B12 deficiency 11/14/2019   Hyperlipidemia 10/31/2019   Primary osteoarthritis of right knee 03/16/2019   Sleep apnea 10/31/2018   Traumatic incomplete tear of left rotator cuff 10/02/2018   Morbid obesity with BMI of 45.0-49.9, adult ([place]) 09/20/2018   Hypothyroidism 09/20/2018   Chronic diastolic CHF (congestive heart failure) ([place]) 09/20/2018   Benign essential hypertension 09/20/2018   PCP:  Rusty Aus, MD Pharmacy:   [facility] [id number] [address] [place] [address] Phone: [phone] Fax: [phone]     Social Determinants of Health (SDOH) Interventions    Readmission Risk Interventions Readmission Risk Prevention Plan 04/26/2021  Transportation Screening Complete  PCP or Specialist Appt within 3-5  Days Complete  HRI or [place] Complete  Social Work Consult for [place] Planning/Counseling Complete  Palliative Care Screening Not Applicable  Medication Review Press photographer) Complete

## 2021-05-22 NOTE — Care Management Important Message (Signed)
Important Message  Patient Details  Name: Christopher [name] MRN: [id number] Date of Birth: 04/26/1951   Medicare Important Message Given:  N/A - LOS <3 / Initial given by admissions     Dannette Barbara 05/22/2021, 10:15 AM

## 2021-05-22 NOTE — Plan of Care (Signed)
  Problem: Education: Goal: Knowledge of General Education information will improve Description: Including pain rating scale, medication(s)/side effects and non-pharmacologic comfort measures Outcome: Progressing   Problem: Clinical Measurements: Goal: Will remain free from infection Outcome: Progressing   Problem: Safety: Goal: Ability to remain free from injury will improve Outcome: Progressing   

## 2021-05-22 NOTE — Progress Notes (Signed)
Patient ID: Christopher [name], male   DOB: 1951/02/25, 70 y.o.   MRN: [id number] [facility] PROGRESS NOTE  Christopher [name] [id number] DOB: Feb 17, 1951 DOA: 05/20/2021 PCP: Christopher Aus, MD  HPI/Subjective: Patient feels okay.  Offers no complaints.  No pain.  No nausea or vomiting.  Came in with altered mental status and a fever of 103.  Objective: Vitals:   05/22/21 0737 05/22/21 1136  BP: (!) 90/51 (!) 100/56  Pulse: 75 73  Resp: 18 17  Temp: 97.7 F (36.5 C) 97.9 F (36.6 C)  SpO2: 96% 97%    Intake/Output Summary (Last 24 hours) at 05/22/2021 1349 Last data filed at 05/22/2021 1131 Gross per 24 hour  Intake 1009.89 ml  Output 755 ml  Net 254.89 ml   Filed Weights   05/20/21 1033 05/21/21 2235  Weight: (!) 152 kg (!) 150.8 kg    ROS: Review of Systems  Respiratory:  Negative for shortness of breath.   Cardiovascular:  Negative for chest pain.  Gastrointestinal:  Negative for abdominal pain, nausea and vomiting.  Exam: Physical Exam HENT:     Head: Normocephalic.     Mouth/Throat:     Pharynx: No oropharyngeal exudate.  Eyes:     General: Lids are normal.     Extraocular Movements: Extraocular movements intact.     Conjunctiva/sclera: Conjunctivae normal.     Pupils: Pupils are equal, round, and reactive to light.  Cardiovascular:     Rate and Rhythm: Normal rate and regular rhythm.     Heart sounds: Normal heart sounds, S1 normal and S2 normal.  Pulmonary:     Breath sounds: Normal breath sounds. No decreased breath sounds, wheezing, rhonchi or rales.  Abdominal:     Palpations: Abdomen is soft.     Tenderness: There is no abdominal tenderness.  Musculoskeletal:     Right lower leg: Swelling present.     Left lower leg: Swelling present.  Skin:    General: Skin is warm.     Comments: Chronic lower extremity discoloration bilaterally.  Bruising around bilateral eyes and nose.  Neurological:     Mental Status: He is alert and oriented to person,  place, and time.      Scheduled Meds:  aspirin EC  81 mg Oral QHS   atorvastatin  20 mg Oral QHS   buPROPion  150 mg Oral BID   cholecalciferol  1,000 Units Oral QHS   enoxaparin (LOVENOX) injection  0.5 mg/kg Subcutaneous Q24H   levothyroxine  88 mcg Oral Daily   midodrine  5 mg Oral TID WC   multivitamin-lutein  1 capsule Oral BID   Ensure Max Protein  11 oz Oral BID   cyanocobalamin  1,000 mcg Oral Once per day on Sun Wed   Continuous Infusions:  cefTRIAXone (ROCEPHIN)  IV      Assessment/Plan:  Systemic inflammatory response syndrome.  Patient had a fever of 103 on presentation with leukocytosis and tachypnea.  I still do not have a source of fever at this point.  Switch antibiotics over to Rocephin for today.  Patient had a low-grade temperature yesterday but afebrile so far today.  Would like to watch another day here. Acute metabolic encephalopathy.  This has improved.   Acute kidney injury on chronic kidney disease stage IIIb.  Creatinine 2.43 at its maximum.  Baseline creatinine 1.62.  Creatinine 2.28 today.  We will discontinue fluids today and watch creatinine.  Holding torsemide. Hypotension.  Increase midodrine to 10 mg 3  times daily.  Check orthostatic vital signs. Paroxysmal atrial fibrillation now in normal sinus rhythm.  Holding off on anticoagulation with head trauma. Fall at rehab.  Check orthostatic vital signs.  Physical therapy evaluation. Chronic diastolic congestive heart failure.  Holding torsemide at this point. Hyperlipidemia unspecified on atorvastatin Hypothyroidism unspecified on levothyroxine Depression on Wellbutrin Weakness.  Physical therapy recommending rehab Morbid obesity with a BMI of 45.09  Code Status:     Code Status Orders  (From admission, onward)           Start     Ordered   05/20/21 1545  Full code  Continuous        05/20/21 1550           Code Status History     Date Active Date Inactive Code Status Order ID  Comments User Context   04/21/2021 0212 04/29/2021 1659 Full Code [id number]  Christopher Ace Christopher Merles, MD ED   03/22/2021 1431 03/24/2021 2209 Full Code [id number]  Christopher Costa, MD ED   03/17/2021 1613 03/20/2021 2158 Full Code [id number]  Christopher Bullock, MD ED   12/16/2020 1725 12/22/2020 2140 Full Code [id number]  Christopher [name], Christopher Delane Ginger, DO ED      Family Communication: Spoke with patient's daughter on the phone Disposition Plan: Status is: Inpatient  Dispo: The patient is from: Rehab              Anticipated d/c is to: Rehab              Patient currently watching temperature curve today and kidney function again tomorrow prior to making a decision on getting back to the rehab.   Difficult to place patient.  No  Antibiotics: Changed antibiotics to Rocephin  Time spent: 28 minutes  [place]

## 2021-05-22 NOTE — Consult Note (Signed)
[place] Nurse Consult Note: Patient receiving care in [facility]. The input from the primary nurse Karn Pickler is greatly appreciated! Reason for Consult:Stage 2 PI to bilateral upper posterior thighs close to the buttocks. And some issue on 2 toes on the right foot. Wound type: I have conversed with the primary nurse, Angelia Mould about the areas of concern.  Via Secure Chat I learned the following from her: "thigh close to buttocks...Marland KitchenMarland Kitchenleft 3cm x1cm, pink granulation tissue, scant drainage, yellow. right 0.5cm x o.5 cm, pink granulation tissue, scant drainage, yellow. no incontinent. nothing on feet/toes, just dry flaky skin. not sure what they saw." Pressure Injury POA: Yes Measurement: as above Wound bed: as above Drainage (amount, consistency, odor) as above Periwound: intact Dressing procedure/placement/frequency: I have ordered for daily washing with soap and water, patting dry, and application of a foam dressing.  Monitor the wound area(s) for worsening of condition such as: Signs/symptoms of infection,  Increase in size,  Development of or worsening of odor, Development of pain, or increased pain at the affected locations.  Notify the medical team if any of these develop.  Thank you for the consult.  [place] nurse will not follow at this time.  Please re-consult the [place] team if needed.  Val Riles, RN, MSN, CWOCN, CNS-BC, pager [phone]

## 2021-05-22 NOTE — Progress Notes (Signed)
Unable to complete orthostatic vitals pt unable to stand long. Will notify incoming shift. Will continue to monitor.

## 2021-05-23 LAB — GLUCOSE, CAPILLARY: Glucose-Capillary: 103 mg/dL — ABNORMAL HIGH (ref 70–99)

## 2021-05-23 LAB — RESP PANEL BY RT-PCR (FLU A&B, COVID) ARPGX2
Influenza A by PCR: NEGATIVE
Influenza B by PCR: NEGATIVE
SARS Coronavirus 2 by RT PCR: NEGATIVE

## 2021-05-23 MED ORDER — ACETAMINOPHEN 325 MG PO TABS: 650.0000 mg | ORAL_TABLET | Freq: Four times a day (QID) | ORAL | Status: AC | PRN

## 2021-05-23 MED ORDER — CEFDINIR 300 MG PO CAPS: 300.0000 mg | ORAL_CAPSULE | Freq: Two times a day (BID) | ORAL | 0 refills | Status: DC

## 2021-05-23 MED ORDER — CEFDINIR 300 MG PO CAPS
300.0000 mg | ORAL_CAPSULE | Freq: Two times a day (BID) | ORAL | Status: DC
  Administered 2021-05-23 – 2021-05-25 (×5): 300 mg via ORAL
  Filled 2021-05-23 (×6): qty 1

## 2021-05-23 MED ORDER — HYDROXYZINE HCL 10 MG PO TABS: 10.0000 mg | ORAL_TABLET | Freq: Three times a day (TID) | ORAL | 0 refills | Status: DC | PRN

## 2021-05-23 MED ORDER — MIDODRINE HCL 10 MG PO TABS: 10.0000 mg | ORAL_TABLET | Freq: Three times a day (TID) | ORAL | 0 refills | Status: DC

## 2021-05-23 NOTE — Discharge Summary (Addendum)
[facility] NAME: Christopher [name]    MR#:  [id number]  DATE OF BIRTH:  Jun 06, 1951  DATE OF ADMISSION:  05/20/2021 ADMITTING PHYSICIAN: Collier Bullock, MD  DATE OF DISCHARGE: 05/23/2021  PRIMARY CARE PHYSICIAN: Rusty Aus, MD    ADMISSION DIAGNOSIS:  Syncope [R55] DVT (deep venous thrombosis) (HCC) [I82.409] Bilateral leg pain [M79.604, M79.605] Bilateral leg edema [R60.0] Fever [R50.9] Acute on chronic diastolic congestive heart failure (HCC) [I50.33] Acute on chronic diastolic CHF (congestive heart failure) (HCC) [I50.33] Sepsis ([place]) [A41.9] Sepsis without acute organ dysfunction, due to unspecified organism ([place]) [A41.9]  DISCHARGE DIAGNOSIS:  SIRS  SECONDARY DIAGNOSIS:   Past Medical History:  Diagnosis Date   Anxiety    CHF (congestive heart failure) (HCC)    COPD (chronic obstructive pulmonary disease) ([place])    Depression    Dysrhythmia    Hyperlipidemia    Hypertension    Hypothyroidism    Sleep apnea     HOSPITAL COURSE:   Systemic inflammatory response syndrome.  The patient had a fever of 103 on presentation with leukocytosis and tachypnea.  Since I do not have a source of infection sepsis was ruled out.  The patient was given aggressive antibiotics initially and switched over to Rocephin.  I will give 7 more doses of Omnicef upon discharge out of the hospital.  Afebrile for the last 2 days. Acute metabolic encephalopathy.  This has improved.  Holding gabapentin. Acute kidney injury on chronic kidney disease stage IIIb.  Creatinine peaked at 2.43.  Creatinine 2.28 yesterday.  Baseline creatinine 1.62.  I held his torsemide while here in the hospital.  Recommend checking a BMP in 3 days.  Can restart torsemide as outpatient. Hypotension.  I increase midodrine to 10 mg 3 times a day.  Recommend checking orthostatics vital signs when working with physical therapy. Brief atrial fibrillation.  Now in normal  sinus rhythm.  Would rather hold off on anticoagulation with recent head trauma.  Can consider anticoagulation at the facility. Fall at rehab. Chronic diastolic congestive heart failure.  Can restart torsemide as outpatient. Hyperlipidemia unspecified on atorvastatin Hypothyroidism unspecified on levothyroxine Depression on Wellbutrin Obesity with a BMI of 45.09 Patient's daughter asked about a knee injection while he is here in the hospital.  I would not do it while he is on antibiotics.  Can follow-up with his orthopedic as outpatient. Chronic hypoxic respiratory failure on 2 L chronically  DISCHARGE CONDITIONS:   Satisfactory  CONSULTS OBTAINED:    None  DRUG ALLERGIES:   Allergies  Allergen Reactions   Albuterol Other (See Comments)    Causes afib    Gramineae Pollens Other (See Comments)    Sneeze and watery eyes Sneeze and watery eyes   Other Other (See Comments)    Other reaction(s): Other (See Comments) Unable to breath when around cats Other reaction(s): Other (See Comments) Unable to breath when around cats    DISCHARGE MEDICATIONS:   Allergies as of 05/23/2021       Reactions   Albuterol Other (See Comments)   Causes afib    Gramineae Pollens Other (See Comments)   Sneeze and watery eyes Sneeze and watery eyes   Other Other (See Comments)   Other reaction(s): Other (See Comments) Unable to breath when around cats Other reaction(s): Other (See Comments) Unable to breath when around cats        Medication List     STOP taking these medications  gabapentin 300 MG capsule Commonly known as: NEURONTIN       TAKE these medications    acetaminophen 325 MG tablet Commonly known as: TYLENOL Take 2 tablets (650 mg total) by mouth every 6 (six) hours as needed for mild pain, fever, headache or moderate pain.   aspirin 81 MG EC tablet Take 81 mg by mouth at bedtime.   atorvastatin 20 MG tablet Commonly known as: LIPITOR Take 20 mg by mouth at  bedtime.   buPROPion 150 MG 12 hr tablet Commonly known as: WELLBUTRIN SR Take 150 mg by mouth 2 (two) times daily. After 1st meal of day and after dinner   cefdinir 300 MG capsule Commonly known as: OMNICEF Take 1 capsule (300 mg total) by mouth every 12 (twelve) hours for 7 doses.   Cholecalciferol 25 MCG (1000 UT) capsule Take 1,000 Units by mouth at bedtime.   cyanocobalamin 1000 MCG tablet Take 1,000 mcg by mouth 2 (two) times a week. At bedtime. Sundays and Wednesdays   Ensure Max Protein Liqd Take 330 mLs (11 oz total) by mouth 2 (two) times daily.   hydrOXYzine 10 MG tablet Commonly known as: ATARAX/VISTARIL Take 1 tablet (10 mg total) by mouth 3 (three) times daily as needed for itching.   levalbuterol 0.63 MG/3ML nebulizer solution Commonly known as: XOPENEX Take 3 mLs (0.63 mg total) by nebulization every 6 (six) hours as needed for wheezing or shortness of breath.   levalbuterol 45 MCG/ACT inhaler Commonly known as: XOPENEX HFA Inhale 2 puffs into the lungs every 4 (four) hours as needed for wheezing.   levothyroxine 88 MCG tablet Commonly known as: SYNTHROID Take 88 mcg by mouth daily. 30 to 60 minutes before breakfast on an empty stomach and with a glass of water   midodrine 10 MG tablet Commonly known as: PROAMATINE Take 1 tablet (10 mg total) by mouth 3 (three) times daily with meals. What changed: how much to take   mupirocin ointment 2 % Commonly known as: BACTROBAN Apply topically daily.   OXYGEN Inhale 2 L into the lungs continuous.   PreserVision AREDS 2 Caps Take 1 capsule by mouth 2 (two) times daily. With food. After 1st meal of day and after dinner   Torsemide 40 MG Tabs Take 40 mg by mouth daily.   trolamine salicylate 10 % cream Commonly known as: ASPERCREME Apply 1 application topically as needed for muscle pain. Apply to knees and feet   vitamin A & D ointment Apply 1 application topically as needed. To groin area.          DISCHARGE INSTRUCTIONS:   Follow-up team at rehab 1 day  If you experience worsening of your admission symptoms, develop shortness of breath, life threatening emergency, suicidal or homicidal thoughts you must seek medical attention immediately by calling 911 or calling your MD immediately  if symptoms less severe.  You Must read complete instructions/literature along with all the possible adverse reactions/side effects for all the Medicines you take and that have been prescribed to you. Take any new Medicines after you have completely understood and accept all the possible adverse reactions/side effects.   Please note  You were cared for by a hospitalist during your hospital stay. If you have any questions about your discharge medications or the care you received while you were in the hospital after you are discharged, you can call the unit and asked to speak with the hospitalist on call if the hospitalist that took care of you  is not available. Once you are discharged, your primary care physician will handle any further medical issues. Please note that NO REFILLS for any discharge medications will be authorized once you are discharged, as it is imperative that you return to your primary care physician (or establish a relationship with a primary care physician if you do not have one) for your aftercare needs so that they can reassess your need for medications and monitor your lab values.    Today   CHIEF COMPLAINT:   Chief Complaint  Patient presents with   hypoxia   Shortness of Breath    HISTORY OF PRESENT ILLNESS:  Christopher [name]  is a 70 y.o. male came in with shortness of breath and altered mental status.  Had a fall prior to coming into the hospital   VITAL SIGNS:  Blood pressure 113/69, pulse 72, temperature 98.6 F (37 C), resp. rate 16, height 6' (1.829 m), weight (!) 150.8 kg, SpO2 98 %.  I/O:   Intake/Output Summary (Last 24 hours) at 05/23/2021 1131 Last data  filed at 05/23/2021 0840 Gross per 24 hour  Intake 756.67 ml  Output 1200 ml  Net -443.33 ml    PHYSICAL EXAMINATION:  GENERAL:  70 y.o.-year-old patient lying in the bed with no acute distress.  EYES: Pupils equal, round, reactive to light and accommodation. No scleral icterus. Extraocular muscles intact.  Bruising around eyes and nose. HEENT: Head atraumatic, normocephalic. Oropharynx and nasopharynx clear.  LUNGS: Normal breath sounds bilaterally, no wheezing, rales,rhonchi or crepitation. No use of accessory muscles of respiration.  CARDIOVASCULAR: S1, S2 normal. No murmurs, rubs, or gallops.  ABDOMEN: Soft, non-tender, non-distended. EXTREMITIES: 2+ pedal edema.  NEUROLOGIC: Cranial nerves II through XII are intact. Muscle strength 5/5 in all extremities. Sensation intact. Gait not checked.  PSYCHIATRIC: The patient is alert and answers all questions appropriately.  SKIN: Bruising around eyes and nose  DATA REVIEW:   CBC Recent Labs  Lab 05/21/21 0843  WBC 9.0  HGB 10.2*  HCT 32.8*  PLT 142*    Chemistries  Recent Labs  Lab 05/20/21 1058 05/21/21 0843 05/22/21 0656  NA 140   < > 140  K 4.1   < > 3.6  CL 97*   < > 97*  CO2 31   < > 33*  GLUCOSE 111*   < > 91  BUN 22   < > 36*  CREATININE 2.21*   < > 2.28*  CALCIUM 8.9   < > 8.1*  AST 16  --   --   ALT 10  --   --   ALKPHOS 50  --   --   BILITOT 1.0  --   --    < > = values in this interval not displayed.     Microbiology Results  Results for orders placed or performed during the hospital encounter of 05/20/21  Culture, blood (routine x 2)     Status: None (Preliminary result)   Collection Time: 05/20/21 10:58 AM   Specimen: BLOOD  Result Value Ref Range Status   Specimen Description BLOOD RIGHT ANTECUBITAL  Final   Special Requests   Final    BOTTLES DRAWN AEROBIC AND ANAEROBIC Blood Culture results may not be optimal due to an excessive volume of blood received in culture bottles   Culture   Final     NO GROWTH 3 DAYS Performed at [facility], [address], [place], [place] [address]    Report Status PENDING  Incomplete  Culture, blood (routine x 2)     Status: None (Preliminary result)   Collection Time: 05/20/21 10:58 AM   Specimen: BLOOD  Result Value Ref Range Status   Specimen Description BLOOD BLOOD RIGHT ARM  Final   Special Requests   Final    BOTTLES DRAWN AEROBIC AND ANAEROBIC Blood Culture results may not be optimal due to an excessive volume of blood received in culture bottles   Culture   Final    NO GROWTH 3 DAYS Performed at [facility], [address] [place], [place] [address]    Report Status PENDING  Incomplete  Resp Panel by RT-PCR (Flu A&B, Covid) Nasopharyngeal Swab     Status: None   Collection Time: 05/20/21 11:07 AM   Specimen: Nasopharyngeal Swab; Nasopharyngeal(NP) swabs in vial transport medium  Result Value Ref Range Status   SARS Coronavirus 2 by RT PCR NEGATIVE NEGATIVE Final    Comment: (NOTE) SARS-CoV-2 target nucleic acids are NOT DETECTED.  The SARS-CoV-2 RNA is generally detectable in upper respiratory specimens during the acute phase of infection. The lowest concentration of SARS-CoV-2 viral copies this assay can detect is 138 copies/mL. A negative result does not preclude SARS-Cov-2 infection and should not be used as the sole basis for treatment or other patient management decisions. A negative result may occur with  improper specimen collection/handling, submission of specimen other than nasopharyngeal swab, presence of viral mutation(s) within the areas targeted by this assay, and inadequate number of viral copies(<138 copies/mL). A negative result must be combined with clinical observations, patient history, and epidemiological information. The expected result is Negative.  Fact Sheet for Patients:  EntrepreneurPulse.com.au  Fact Sheet for Healthcare Providers:   IncredibleEmployment.be  This test is no t yet approved or cleared by the [place] FDA and  has been authorized for detection and/or diagnosis of SARS-CoV-2 by FDA under an Emergency Use Authorization (EUA). This EUA will remain  in effect (meaning this test can be used) for the duration of the COVID-19 declaration under Section 564(b)(1) of the Act, 21 U.S.C.section 360bbb-3(b)(1), unless the authorization is terminated  or revoked sooner.       Influenza A by PCR NEGATIVE NEGATIVE Final   Influenza B by PCR NEGATIVE NEGATIVE Final    Comment: (NOTE) The Xpert Xpress SARS-CoV-2/FLU/RSV plus assay is intended as an aid in the diagnosis of influenza from Nasopharyngeal swab specimens and should not be used as a sole basis for treatment. Nasal washings and aspirates are unacceptable for Xpert Xpress SARS-CoV-2/FLU/RSV testing.  Fact Sheet for Patients: EntrepreneurPulse.com.au  Fact Sheet for Healthcare Providers: IncredibleEmployment.be  This test is not yet approved or cleared by the [place] FDA and has been authorized for detection and/or diagnosis of SARS-CoV-2 by FDA under an Emergency Use Authorization (EUA). This EUA will remain in effect (meaning this test can be used) for the duration of the COVID-19 declaration under Section 564(b)(1) of the Act, 21 U.S.C. section 360bbb-3(b)(1), unless the authorization is terminated or revoked.  Performed at [facility], [address] [place], [place] [address]   MRSA Next Gen by PCR, Nasal     Status: None   Collection Time: 05/21/21  6:33 AM   Specimen: Nasal Mucosa; Nasal Swab  Result Value Ref Range Status   MRSA by PCR Next Gen NOT DETECTED NOT DETECTED Final    Comment: (NOTE) The GeneXpert MRSA Assay (FDA approved for NASAL specimens only), is one component of a comprehensive MRSA  colonization surveillance program. It is not intended to  diagnose MRSA infection nor to guide or monitor treatment for MRSA infections. Test performance is not FDA approved in patients less than 1 years old. Performed at [facility], [address] [place], [place] [address]   Resp Panel by RT-PCR (Flu A&B, Covid) Nasopharyngeal Swab     Status: None   Collection Time: 05/23/21 10:21 AM   Specimen: Nasopharyngeal Swab; Nasopharyngeal(NP) swabs in vial transport medium  Result Value Ref Range Status   SARS Coronavirus 2 by RT PCR NEGATIVE NEGATIVE Final    Comment: (NOTE) SARS-CoV-2 target nucleic acids are NOT DETECTED.  The SARS-CoV-2 RNA is generally detectable in upper respiratory specimens during the acute phase of infection. The lowest concentration of SARS-CoV-2 viral copies this assay can detect is 138 copies/mL. A negative result does not preclude SARS-Cov-2 infection and should not be used as the sole basis for treatment or other patient management decisions. A negative result may occur with  improper specimen collection/handling, submission of specimen other than nasopharyngeal swab, presence of viral mutation(s) within the areas targeted by this assay, and inadequate number of viral copies(<138 copies/mL). A negative result must be combined with clinical observations, patient history, and epidemiological information. The expected result is Negative.  Fact Sheet for Patients:  EntrepreneurPulse.com.au  Fact Sheet for Healthcare Providers:  IncredibleEmployment.be  This test is no t yet approved or cleared by the [place] FDA and  has been authorized for detection and/or diagnosis of SARS-CoV-2 by FDA under an Emergency Use Authorization (EUA). This EUA will remain  in effect (meaning this test can be used) for the duration of the COVID-19 declaration under Section 564(b)(1) of the Act, 21 U.S.C.section 360bbb-3(b)(1), unless the authorization is terminated  or revoked  sooner.       Influenza A by PCR NEGATIVE NEGATIVE Final   Influenza B by PCR NEGATIVE NEGATIVE Final    Comment: (NOTE) The Xpert Xpress SARS-CoV-2/FLU/RSV plus assay is intended as an aid in the diagnosis of influenza from Nasopharyngeal swab specimens and should not be used as a sole basis for treatment. Nasal washings and aspirates are unacceptable for Xpert Xpress SARS-CoV-2/FLU/RSV testing.  Fact Sheet for Patients: EntrepreneurPulse.com.au  Fact Sheet for Healthcare Providers: IncredibleEmployment.be  This test is not yet approved or cleared by the [place] FDA and has been authorized for detection and/or diagnosis of SARS-CoV-2 by FDA under an Emergency Use Authorization (EUA). This EUA will remain in effect (meaning this test can be used) for the duration of the COVID-19 declaration under Section 564(b)(1) of the Act, 21 U.S.C. section 360bbb-3(b)(1), unless the authorization is terminated or revoked.  Performed at [facility], [address] [place], [place] [address]     RADIOLOGY:  US Carotid Bilateral  Result Date: 05/21/2021 CLINICAL DATA:  Syncope EXAM: BILATERAL CAROTID DUPLEX ULTRASOUND TECHNIQUE: Pearline Cables scale imaging, color Doppler and duplex ultrasound were performed of bilateral carotid and vertebral arteries in the neck. COMPARISON:  None. FINDINGS: Criteria: Quantification of carotid stenosis is based on velocity parameters that correlate the residual internal carotid diameter with NASCET-based stenosis levels, using the diameter of the distal internal carotid lumen as the denominator for stenosis measurement. The following velocity measurements were obtained: RIGHT ICA: 120/15 cm/sec CCA: [id number] cm/sec SYSTOLIC ICA/CCA RATIO:  1.0 ECA: 132 cm/sec LEFT ICA: 124/25 cm/sec CCA: [id number] cm/sec SYSTOLIC ICA/CCA RATIO:  0.9 ECA: 120 cm/sec RIGHT CAROTID ARTERY: No significant atheromatous plaque. RIGHT VERTEBRAL  ARTERY:  Antegrade flow. LEFT CAROTID ARTERY:  No significant atheromatous plaque. LEFT VERTEBRAL ARTERY:  Antegrade flow. IMPRESSION: Less than 50% stenosis of the internal carotid arteries. Electronically Signed   By: Miachel Roux M.D.   On: 05/21/2021 17:01   DG Chest Port 1 View  Result Date: 05/22/2021 CLINICAL DATA:  Fever and chronic cough EXAM: PORTABLE CHEST 1 VIEW COMPARISON:  05/20/2021 FINDINGS: Mild interstitial coarsening. Normal heart size when allowing for apical fat pad. Stable aortic and hilar contours. There is no edema, consolidation, effusion, or pneumothorax. IMPRESSION: No active disease.  Stable from CT and radiograph 2 days ago. Electronically Signed   By: Jorje Guild M.D.   On: 05/22/2021 07:10     Management plans discussed with the patient, family and they are in agreement.  CODE STATUS:     Code Status Orders  (From admission, onward)           Start     Ordered   05/20/21 1545  Full code  Continuous        05/20/21 1550           Code Status History     Date Active Date Inactive Code Status Order ID Comments User Context   04/21/2021 0212 04/29/2021 1659 Full Code [id number]  Sidney Ace Arvella Merles, MD ED   03/22/2021 1431 03/24/2021 2209 Full Code [id number]  Ivor Costa, MD ED   03/17/2021 1613 03/20/2021 2158 Full Code [id number]  Collier Bullock, MD ED   12/16/2020 1725 12/22/2020 2140 Full Code [id number]  Cox, Amy N, DO ED       TOTAL TIME TAKING CARE OF THIS PATIENT: 32 minutes.    Loletha Grayer M.D on 05/23/2021 at 11:31 AM   [facility]  CC: Primary care physician; Rusty Aus, MD

## 2021-05-23 NOTE — TOC Progression Note (Addendum)
Transition of Care [facility]) - Progression Note    Patient Details  Name: Christopher [name] MRN: [id number] Date of Birth: [birth year]  Transition of [facility]) CM/SW Contact  [name], RN Phone Number: 05/23/2021, 1:54 PM  Clinical Narrative:  [facility] can take patient today pending Ins Authorization which was also submitted.  Daughter notified of pending transfer today via phone. Forms printed to unit RN. DNR and pain medication Rx signed by provider. Will Unit RN notified of ins. Authorization pending, possible delay. Simmie Davies RN CM   [id number] [facility] authorization.   5:16 pm: [facility] has portal has not responded to ins auth. Daughter called and explained. She stated that that had to file a [place] appreal to keep her father in the facility and that was pending. May impact this authorization. Will follow up Sunday. [place] Barrier entered as pending ins. authorization. Notified unit RN. Messaged facility ins. authorization pending status. Simmie Davies RN CM   Expected Discharge Plan: Skilled Nursing Facility Barriers to Discharge: Insurance Authorization  Expected Discharge Plan and Services Expected Discharge Plan: [place] Choice: [place] arrangements for the past 2 months: Single Family Home Expected Discharge Date: 05/23/21               DME Arranged: N/A DME Agency: NA       HH Arranged: NA HH Agency: NA         Social Determinants of Health (SDOH) Interventions    Readmission Risk Interventions Readmission Risk Prevention Plan 04/26/2021  Transportation Screening Complete  PCP or Specialist Appt within 3-5 Days Complete  HRI or [place] Complete  Social Work Consult for [name] Planning/Counseling Complete  Palliative Care Screening Not Applicable  Medication Review Press photographer) Complete

## 2021-05-24 LAB — BASIC METABOLIC PANEL
Anion gap: 11 (ref 5–15)
BUN: 23 mg/dL (ref 8–23)
CO2: 32 mmol/L (ref 22–32)
Calcium: 8.9 mg/dL (ref 8.9–10.3)
Chloride: 98 mmol/L (ref 98–111)
Creatinine, Ser: 1.36 mg/dL — ABNORMAL HIGH (ref 0.61–1.24)
GFR, Estimated: 56 mL/min — ABNORMAL LOW (ref >60)
Glucose, Bld: 90 mg/dL (ref 70–99)
Potassium: 3.4 mmol/L — ABNORMAL LOW (ref 3.5–5.1)
Sodium: 141 mmol/L (ref 135–145)

## 2021-05-24 MED ORDER — TORSEMIDE 20 MG PO TABS: 20.0000 mg | ORAL_TABLET | Freq: Every day | ORAL | Status: DC

## 2021-05-24 MED ORDER — TORSEMIDE 20 MG PO TABS
20.0000 mg | ORAL_TABLET | Freq: Every day | ORAL | Status: DC
  Administered 2021-05-24 – 2021-05-25 (×2): 20 mg via ORAL
  Filled 2021-05-24 (×2): qty 1

## 2021-05-24 MED ORDER — POTASSIUM CHLORIDE CRYS ER 20 MEQ PO TBCR: 20.0000 meq | EXTENDED_RELEASE_TABLET | Freq: Every day | ORAL | 0 refills | Status: DC

## 2021-05-24 MED ORDER — POTASSIUM CHLORIDE CRYS ER 20 MEQ PO TBCR
40.0000 meq | EXTENDED_RELEASE_TABLET | Freq: Every day | ORAL | Status: DC
  Administered 2021-05-24 – 2021-05-25 (×2): 40 meq via ORAL
  Filled 2021-05-24 (×2): qty 4

## 2021-05-24 NOTE — Progress Notes (Signed)
Patient ID: Alekxander Grossberg, male   DOB: [birth year], 70 y.o.   MRN: [id number] [facility] PROGRESS NOTE  Yon Portillo [id number] DOB: 28-Dec-1950 DOA: 05/20/2021 PCP: Rusty Aus, MD  HPI/Subjective: Apparently insurance authorization was needed to go out to rehab.  They did not receive this yesterday or today so far.  Patient feels fine.  Initially admitted with altered mental status and a fever of 103.  Objective: Vitals:   05/24/21 0727 05/24/21 1210  BP: 101/62 109/69  Pulse: 68 71  Resp: 18 17  Temp: 98.1 F (36.7 C) 97.8 F (36.6 C)  SpO2: 98% 98%    Intake/Output Summary (Last 24 hours) at 05/24/2021 1337 Last data filed at 05/24/2021 1246 Gross per 24 hour  Intake 600 ml  Output 1000 ml  Net -400 ml   Filed Weights   05/20/21 1033 05/21/21 2235  Weight: (!) 152 kg (!) 150.8 kg    ROS: Review of Systems  Respiratory:  Negative for shortness of breath.   Cardiovascular:  Negative for chest pain.  Gastrointestinal:  Negative for abdominal pain, nausea and vomiting.  Exam: Physical Exam HENT:     Head: Normocephalic.     Mouth/Throat:     Pharynx: No oropharyngeal exudate.  Eyes:     Conjunctiva/sclera: Conjunctivae normal.     Pupils: Pupils are equal, round, and reactive to light.  Cardiovascular:     Rate and Rhythm: Normal rate and regular rhythm.     Heart sounds: Normal heart sounds, S1 normal and S2 normal.  Pulmonary:     Breath sounds: Examination of the right-lower field reveals decreased breath sounds. Examination of the left-lower field reveals decreased breath sounds. Decreased breath sounds present. No wheezing, rhonchi or rales.  Abdominal:     Palpations: Abdomen is soft.     Tenderness: There is no abdominal tenderness.  Musculoskeletal:     Right lower leg: Swelling present.     Left lower leg: Swelling present.  Skin:    General: Skin is warm.     Findings: No rash.  Neurological:     Mental Status: He is alert and oriented  to person, place, and time.      Scheduled Meds:  aspirin EC  81 mg Oral QHS   atorvastatin  20 mg Oral QHS   buPROPion  150 mg Oral BID   cefdinir  300 mg Oral Q12H   cholecalciferol  1,000 Units Oral QHS   enoxaparin (LOVENOX) injection  0.5 mg/kg Subcutaneous Q24H   levothyroxine  88 mcg Oral Daily   midodrine  10 mg Oral TID WC   multivitamin-lutein  1 capsule Oral BID   Ensure Max Protein  11 oz Oral BID   cyanocobalamin  1,000 mcg Oral Once per day on Sun Wed     Assessment/Plan:  Systemic inflammatory response syndrome.  Patient had a fever of 103 on presentation with leukocytosis and tachypnea.  Blood cultures negative urine analysis negative.  No source of fever so sepsis ruled out.  Switch over to [facility] and giving antibiotics empirically at this point Acute metabolic encephalopathy.  This has improved.  Holding gabapentin. Acute kidney injury on chronic kidney disease stage IIIa.  Creatinine peaked at 2.43 and creatinine today better than his baseline at 1.36.  GFR today 56.  Restart low-dose torsemide. Hypotension.  Continue increased dose of midodrine 10 mg 3 times a day.  Recommend checking orthostatics with working with physical therapy. Brief atrial fibrillation on presentation now in  normal sinus rhythm.  Would rather hold off on anticoagulation with recent head trauma.  Currently in normal sinus rhythm.  Can consider anticoagulation in 1 week. Fall at rehab Chronic diastolic congestive heart failure can restart low-dose torsemide today Hyperlipidemia unspecified on atorvastatin Hypothyroidism unspecified on levothyroxine Depression on Wellbutrin Morbid obesity with a BMI of 45.0 Chronic hypoxic respiratory failure on 2 L chronically       Code Status:     Code Status Orders  (From admission, onward)           Start     Ordered   05/20/21 1545  Full code  Continuous        05/20/21 1550           Code Status History     Date Active Date  Inactive Code Status Order ID Comments User Context   04/21/2021 0212 04/29/2021 1659 Full Code [id number]  Sidney Ace Arvella Merles, MD ED   03/22/2021 1431 03/24/2021 2209 Full Code [id number]  Ivor Costa, MD ED   03/17/2021 1613 03/20/2021 2158 Full Code [id number]  Collier Bullock, MD ED   12/16/2020 1725 12/22/2020 2140 Full Code [id number]  Cox, Amy Delane Ginger, DO ED      Family Communication: Updated patient's daughter on the phone yesterday Disposition Plan: Status is: Inpatient  Dispo: The patient is from: Rehab              Anticipated d/c is to: Rehab              Patient currently awaiting insurance authorization to go to rehab   Difficult to place patient.  Yes  Time spent: 27 minutes  [place]

## 2021-05-24 NOTE — Plan of Care (Signed)
  Problem: Education: Goal: Knowledge of General Education information will improve Description: Including pain rating scale, medication(s)/side effects and non-pharmacologic comfort measures Outcome: Progressing   Problem: Clinical Measurements: Goal: Will remain free from infection Outcome: Progressing   Problem: Elimination: Goal: Will not experience complications related to urinary retention Outcome: Progressing   Problem: Safety: Goal: Ability to remain free from injury will improve Outcome: Progressing   Problem: Elimination: Goal: Will not experience complications related to bowel motility Outcome: Progressing

## 2021-05-25 LAB — CULTURE, BLOOD (ROUTINE X 2)
Culture: NO GROWTH
Culture: NO GROWTH

## 2021-05-25 LAB — BASIC METABOLIC PANEL
Anion gap: 5 (ref 5–15)
BUN: 21 mg/dL (ref 8–23)
CO2: 34 mmol/L — ABNORMAL HIGH (ref 22–32)
Calcium: 8.7 mg/dL — ABNORMAL LOW (ref 8.9–10.3)
Chloride: 100 mmol/L (ref 98–111)
Creatinine, Ser: 1.49 mg/dL — ABNORMAL HIGH (ref 0.61–1.24)
GFR, Estimated: 50 mL/min — ABNORMAL LOW (ref >60)
Glucose, Bld: 87 mg/dL (ref 70–99)
Potassium: 4 mmol/L (ref 3.5–5.1)
Sodium: 139 mmol/L (ref 135–145)

## 2021-05-25 MED ORDER — CEFDINIR 300 MG PO CAPS: 300.0000 mg | ORAL_CAPSULE | Freq: Two times a day (BID) | ORAL | 0 refills | Status: AC

## 2021-05-25 MED ORDER — NYSTATIN 100000 UNIT/GM EX POWD
Freq: Three times a day (TID) | CUTANEOUS | Status: DC
  Filled 2021-05-25: qty 15

## 2021-05-25 MED ORDER — HYDROCORTISONE 1 % EX CREA: TOPICAL_CREAM | CUTANEOUS | 0 refills | Status: DC | PRN

## 2021-05-25 MED ORDER — HYDROCORTISONE 1 % EX CREA
TOPICAL_CREAM | CUTANEOUS | Status: DC | PRN
  Filled 2021-05-25: qty 28

## 2021-05-25 MED ORDER — NYSTATIN 100000 UNIT/GM EX POWD: Freq: Three times a day (TID) | CUTANEOUS | 0 refills | Status: DC

## 2021-05-25 NOTE — TOC Progression Note (Signed)
Transition of Care [facility]) - Progression Note    Patient Details  Name: Christopher [name] MRN: [id number] Date of Birth: [birth year]  Transition of Care [facility]) CM/SW [place], RN Phone Number: 05/25/2021, 12:28 PM  Clinical Narrative:  Patient to discharge to [place] room 6.      Expected Discharge Plan: Skilled Nursing Facility Barriers to Discharge: Barriers Resolved  Expected Discharge Plan and Services Expected Discharge Plan: [place] Choice: [place] Living arrangements for the past 2 months: Single Family Home Expected Discharge Date: 05/25/21               DME Arranged: N/A DME Agency: NA       HH Arranged: NA HH Agency: NA         Social Determinants of Health (SDOH) Interventions    Readmission Risk Interventions Readmission Risk Prevention Plan 04/26/2021  Transportation Screening Complete  PCP or Specialist Appt within 3-5 Days Complete  HRI or [place] Complete  Social Work Consult for [place] Planning/Counseling Complete  Palliative Care Screening Not Applicable  Medication Review [employer]) Complete

## 2021-05-25 NOTE — Care Management Important Message (Signed)
Important Message  Patient Details  Name: Christopher [name] MRN: [id number] Date of Birth: 1951-04-27   Medicare Important Message Given:  No  Patient discharged prior to arrival to unit to deliver concurrent Medicare IM. Dannette Barbara 05/25/2021, 2:00 PM

## 2021-05-25 NOTE — Discharge Summary (Signed)
Christopher [name] at [place] NAME: Christopher [name]    MR#:  [id number]  DATE OF BIRTH:  August 10, 1951  DATE OF ADMISSION:  05/20/2021 ADMITTING PHYSICIAN: Collier Bullock, MD  DATE OF DISCHARGE: 05/25/2021  PRIMARY CARE PHYSICIAN: Rusty Aus, MD    ADMISSION DIAGNOSIS:  Syncope [R55] DVT (deep venous thrombosis) (HCC) [I82.409] Bilateral leg pain [M79.604, M79.605] Bilateral leg edema [R60.0] Fever [R50.9] Acute on chronic diastolic congestive heart failure (HCC) [I50.33] Acute on chronic diastolic CHF (congestive heart failure) (HCC) [I50.33] Sepsis ([place]) [A41.9] Sepsis without acute organ dysfunction, due to unspecified organism ([place]) [A41.9]  DISCHARGE DIAGNOSIS:  SIRS  SECONDARY DIAGNOSIS:   Past Medical History:  Diagnosis Date   Anxiety    CHF (congestive heart failure) (HCC)    COPD (chronic obstructive pulmonary disease) (HCC)    Depression    Dysrhythmia    Hyperlipidemia    Hypertension    Hypothyroidism    Sleep apnea     HOSPITAL COURSE:   1.  Systemic inflammatory response syndrome.  The patient had a fever of 103 on presentation with leukocytosis and tachypnea.  Blood cultures were negative.  Urine analysis also negative.  No source of fever identified for sepsis was ruled out.  Patient was given aggressive antibiotics initially and switched over to Rocephin and will be switched over to [facility] for a few more doses. 2.  Acute metabolic encephalopathy.  This has improved.  Holding gabapentin. 3.  Acute kidney injury on chronic kidney disease stage IIIa.  Creatinine peaked at 2.43 and improved down to 1.36.  I restarted low-dose torsemide 20 mg daily and creatinine up to 1.49.  Check a BMP next week. 4.  Hypotension.  Continue increased dose of midodrine 10 mg 3 times a day.  Recommend checking orthostatics when working with physical therapy. 5.  Brief episode of proximal atrial fibrillation now normal sinus rhythm.  Likely  secondary to dehydration on presentation would rather hold off on anticoagulation at this point with recent head trauma.  Patient in normal sinus rhythm.  Consider anticoagulation as outpatient.  6.  Fall at rehab 7.  Chronic diastolic congestive heart failure.  Continue lower dose torsemide.  Unable to give other medication secondary to hypotension. 8.  Hyperlipidemia unspecified.  Continue atorvastatin 9.  Hypothyroidism unspecified on levothyroxine 10.  Depression on Wellbutrin 11.  Morbid obesity with a BMI of 45.09 12.  Chronic hypoxic respiratory failure on 2 L chronically 13.  Redness groin and legs likely fungal in nature prescribed nystatin powder.  DISCHARGE CONDITIONS:   Satisfactory  CONSULTS OBTAINED:    None  DRUG ALLERGIES:   Allergies  Allergen Reactions   Albuterol Other (See Comments)    Causes afib    Gramineae Pollens Other (See Comments)    Sneeze and watery eyes Sneeze and watery eyes   Other Other (See Comments)    Other reaction(s): Other (See Comments) Unable to breath when around cats Other reaction(s): Other (See Comments) Unable to breath when around cats    DISCHARGE MEDICATIONS:   Allergies as of 05/25/2021       Reactions   Albuterol Other (See Comments)   Causes afib    Gramineae Pollens Other (See Comments)   Sneeze and watery eyes Sneeze and watery eyes   Other Other (See Comments)   Other reaction(s): Other (See Comments) Unable to breath when around cats Other reaction(s): Other (See Comments) Unable to breath when around cats  Medication List     STOP taking these medications    gabapentin 300 MG capsule Commonly known as: NEURONTIN       TAKE these medications    acetaminophen 325 MG tablet Commonly known as: TYLENOL Take 2 tablets (650 mg total) by mouth every 6 (six) hours as needed for mild pain, fever, headache or moderate pain.   aspirin 81 MG EC tablet Take 81 mg by mouth at bedtime.    atorvastatin 20 MG tablet Commonly known as: LIPITOR Take 20 mg by mouth at bedtime.   buPROPion 150 MG 12 hr tablet Commonly known as: WELLBUTRIN SR Take 150 mg by mouth 2 (two) times daily. After 1st meal of day and after dinner   cefdinir 300 MG capsule Commonly known as: OMNICEF Take 1 capsule (300 mg total) by mouth every 12 (twelve) hours for 7 doses.   Cholecalciferol 25 MCG (1000 UT) capsule Take 1,000 Units by mouth at bedtime.   cyanocobalamin 1000 MCG tablet Take 1,000 mcg by mouth 2 (two) times a week. At bedtime. Sundays and Wednesdays   Ensure Max Protein Liqd Take 330 mLs (11 oz total) by mouth 2 (two) times daily.   hydrocortisone cream 1 % Apply topically as needed for itching.   hydrOXYzine 10 MG tablet Commonly known as: ATARAX/VISTARIL Take 1 tablet (10 mg total) by mouth 3 (three) times daily as needed for itching.   levalbuterol 0.63 MG/3ML nebulizer solution Commonly known as: XOPENEX Take 3 mLs (0.63 mg total) by nebulization every 6 (six) hours as needed for wheezing or shortness of breath.   levalbuterol 45 MCG/ACT inhaler Commonly known as: XOPENEX HFA Inhale 2 puffs into the lungs every 4 (four) hours as needed for wheezing.   levothyroxine 88 MCG tablet Commonly known as: SYNTHROID Take 88 mcg by mouth daily. 30 to 60 minutes before breakfast on an empty stomach and with a glass of water   midodrine 10 MG tablet Commonly known as: PROAMATINE Take 1 tablet (10 mg total) by mouth 3 (three) times daily with meals. What changed: how much to take   mupirocin ointment 2 % Commonly known as: BACTROBAN Apply topically daily.   nystatin powder Commonly known as: MYCOSTATIN/NYSTOP Apply topically 3 (three) times daily.   OXYGEN Inhale 2 L into the lungs continuous.   potassium chloride SA 20 MEQ tablet Commonly known as: KLOR-CON Take 1 tablet (20 mEq total) by mouth daily.   PreserVision AREDS 2 Caps Take 1 capsule by mouth 2 (two)  times daily. With food. After 1st meal of day and after dinner   torsemide 20 MG tablet Commonly known as: DEMADEX Take 1 tablet (20 mg total) by mouth daily. What changed:  medication strength how much to take   trolamine salicylate 10 % cream Commonly known as: ASPERCREME Apply 1 application topically as needed for muscle pain. Apply to knees and feet   vitamin A & D ointment Apply 1 application topically as needed. To groin area.         DISCHARGE INSTRUCTIONS:   Follow-up team at rehab 1 day  If you experience worsening of your admission symptoms, develop shortness of breath, life threatening emergency, suicidal or homicidal thoughts you must seek medical attention immediately by calling 911 or calling your MD immediately  if symptoms less severe.  You Must read complete instructions/literature along with all the possible adverse reactions/side effects for all the Medicines you take and that have been prescribed to you. Take any new  Medicines after you have completely understood and accept all the possible adverse reactions/side effects.   Please note  You were cared for by a hospitalist during your hospital stay. If you have any questions about your discharge medications or the care you received while you were in the hospital after you are discharged, you can call the unit and asked to speak with the hospitalist on call if the hospitalist that took care of you is not available. Once you are discharged, your primary care physician will handle any further medical issues. Please note that NO REFILLS for any discharge medications will be authorized once you are discharged, as it is imperative that you return to your primary care physician (or establish a relationship with a primary care physician if you do not have one) for your aftercare needs so that they can reassess your need for medications and monitor your lab values.    Today   CHIEF COMPLAINT:   Chief Complaint   Patient presents with   hypoxia   Shortness of Breath    HISTORY OF PRESENT ILLNESS:  Christopher [name]  is a 70 y.o. male came in with altered mental status and fever of 103   VITAL SIGNS:  Blood pressure 108/63, pulse 67, temperature 98 F (36.7 C), resp. rate 17, height 6' (1.829 m), weight (!) 150.8 kg, SpO2 97 %.  I/O:   Intake/Output Summary (Last 24 hours) at 05/25/2021 1129 Last data filed at 05/25/2021 0950 Gross per 24 hour  Intake 660 ml  Output 1450 ml  Net -790 ml    PHYSICAL EXAMINATION:  GENERAL:  70 y.o.-year-old patient lying in the bed with no acute distress.  EYES: Pupils equal, round, reactive to light and accommodation. No scleral icterus. Extraocular muscles intact.  HEENT: Head atraumatic, normocephalic. Oropharynx and nasopharynx clear.   LUNGS: Normal breath sounds bilaterally, no wheezing, rales,rhonchi or crepitation. No use of accessory muscles of respiration.  CARDIOVASCULAR: S1, S2 normal. No murmurs, rubs, or gallops.  ABDOMEN: Soft, non-tender, non-distended. Bowel sounds present. No organomegaly or mass.  EXTREMITIES: 2+ pedal edema.  NEUROLOGIC: Cranial nerves II through XII are intact. Muscle strength 5/5 in all extremities. Sensation intact. Gait not checked.  PSYCHIATRIC: The patient is alert and oriented x 3.  SKIN: Chronic lower extremity skin discoloration bilaterally.  Bruising bilateral eyes and nose.  Redness groin likely fungal.  DATA REVIEW:   CBC Recent Labs  Lab 05/21/21 0843  WBC 9.0  HGB 10.2*  HCT 32.8*  PLT 142*    Chemistries  Recent Labs  Lab 05/20/21 1058 05/21/21 0843 05/25/21 0602  NA 140   < > 139  K 4.1   < > 4.0  CL 97*   < > 100  CO2 31   < > 34*  GLUCOSE 111*   < > 87  BUN 22   < > 21  CREATININE 2.21*   < > 1.49*  CALCIUM 8.9   < > 8.7*  AST 16  --   --   ALT 10  --   --   ALKPHOS 50  --   --   BILITOT 1.0  --   --    < > = values in this interval not displayed.     Microbiology Results   Results for orders placed or performed during the hospital encounter of 05/20/21  Culture, blood (routine x 2)     Status: None   Collection Time: 05/20/21 10:58 AM   Specimen: BLOOD  Result Value Ref Range Status   Specimen Description BLOOD RIGHT ANTECUBITAL  Final   Special Requests   Final    BOTTLES DRAWN AEROBIC AND ANAEROBIC Blood Culture results may not be optimal due to an excessive volume of blood received in culture bottles   Culture   Final    NO GROWTH 5 DAYS Performed at [facility], [address] [place], [place] [address]    Report Status 05/25/2021 FINAL  Final  Culture, blood (routine x 2)     Status: None   Collection Time: 05/20/21 10:58 AM   Specimen: BLOOD  Result Value Ref Range Status   Specimen Description BLOOD BLOOD RIGHT ARM  Final   Special Requests   Final    BOTTLES DRAWN AEROBIC AND ANAEROBIC Blood Culture results may not be optimal due to an excessive volume of blood received in culture bottles   Culture   Final    NO GROWTH 5 DAYS Performed at [facility], [address] [place], [place] [address]    Report Status 05/25/2021 FINAL  Final  Resp Panel by RT-PCR (Flu A&B, Covid) Nasopharyngeal Swab     Status: None   Collection Time: 05/20/21 11:07 AM   Specimen: Nasopharyngeal Swab; Nasopharyngeal(NP) swabs in vial transport medium  Result Value Ref Range Status   SARS Coronavirus 2 by RT PCR NEGATIVE NEGATIVE Final    Comment: (NOTE) SARS-CoV-2 target nucleic acids are NOT DETECTED.  The SARS-CoV-2 RNA is generally detectable in upper respiratory specimens during the acute phase of infection. The lowest concentration of SARS-CoV-2 viral copies this assay can detect is 138 copies/mL. A negative result does not preclude SARS-Cov-2 infection and should not be used as the sole basis for treatment or other patient management decisions. A negative result may occur with  improper specimen collection/handling, submission of  specimen other than nasopharyngeal swab, presence of viral mutation(s) within the areas targeted by this assay, and inadequate number of viral copies(<138 copies/mL). A negative result must be combined with clinical observations, patient history, and epidemiological information. The expected result is Negative.  Fact Sheet for Patients:  EntrepreneurPulse.com.au  Fact Sheet for Healthcare Providers:  IncredibleEmployment.be  This test is no t yet approved or cleared by the [place] FDA and  has been authorized for detection and/or diagnosis of SARS-CoV-2 by FDA under an Emergency Use Authorization (EUA). This EUA will remain  in effect (meaning this test can be used) for the duration of the COVID-19 declaration under Section 564(b)(1) of the Act, 21 U.S.C.section 360bbb-3(b)(1), unless the authorization is terminated  or revoked sooner.       Influenza A by PCR NEGATIVE NEGATIVE Final   Influenza B by PCR NEGATIVE NEGATIVE Final    Comment: (NOTE) The Xpert Xpress SARS-CoV-2/FLU/RSV plus assay is intended as an aid in the diagnosis of influenza from Nasopharyngeal swab specimens and should not be used as a sole basis for treatment. Nasal washings and aspirates are unacceptable for Xpert Xpress SARS-CoV-2/FLU/RSV testing.  Fact Sheet for Patients: EntrepreneurPulse.com.au  Fact Sheet for Healthcare Providers: IncredibleEmployment.be  This test is not yet approved or cleared by the [place] FDA and has been authorized for detection and/or diagnosis of SARS-CoV-2 by FDA under an Emergency Use Authorization (EUA). This EUA will remain in effect (meaning this test can be used) for the duration of the COVID-19 declaration under Section 564(b)(1) of the Act, 21 U.S.C. section 360bbb-3(b)(1), unless the authorization is terminated or revoked.  Performed at  [facility], [address] [place], [place] [address]   MRSA Next Gen by PCR, Nasal     Status: None   Collection Time: 05/21/21  6:33 AM   Specimen: Nasal Mucosa; Nasal Swab  Result Value Ref Range Status   MRSA by PCR Next Gen NOT DETECTED NOT DETECTED Final    Comment: (NOTE) The GeneXpert MRSA Assay (FDA approved for NASAL specimens only), is one component of a comprehensive MRSA colonization surveillance program. It is not intended to diagnose MRSA infection nor to guide or monitor treatment for MRSA infections. Test performance is not FDA approved in patients less than 56 years old. Performed at [facility], [address] [place], [place] [address]   Resp Panel by RT-PCR (Flu A&B, Covid) Nasopharyngeal Swab     Status: None   Collection Time: 05/23/21 10:21 AM   Specimen: Nasopharyngeal Swab; Nasopharyngeal(NP) swabs in vial transport medium  Result Value Ref Range Status   SARS Coronavirus 2 by RT PCR NEGATIVE NEGATIVE Final    Comment: (NOTE) SARS-CoV-2 target nucleic acids are NOT DETECTED.  The SARS-CoV-2 RNA is generally detectable in upper respiratory specimens during the acute phase of infection. The lowest concentration of SARS-CoV-2 viral copies this assay can detect is 138 copies/mL. A negative result does not preclude SARS-Cov-2 infection and should not be used as the sole basis for treatment or other patient management decisions. A negative result may occur with  improper specimen collection/handling, submission of specimen other than nasopharyngeal swab, presence of viral mutation(s) within the areas targeted by this assay, and inadequate number of viral copies(<138 copies/mL). A negative result must be combined with clinical observations, patient history, and epidemiological information. The expected result is Negative.  Fact Sheet for Patients:  EntrepreneurPulse.com.au  Fact Sheet for Healthcare Providers:   IncredibleEmployment.be  This test is no t yet approved or cleared by the [place] FDA and  has been authorized for detection and/or diagnosis of SARS-CoV-2 by FDA under an Emergency Use Authorization (EUA). This EUA will remain  in effect (meaning this test can be used) for the duration of the COVID-19 declaration under Section 564(b)(1) of the Act, 21 U.S.C.section 360bbb-3(b)(1), unless the authorization is terminated  or revoked sooner.       Influenza A by PCR NEGATIVE NEGATIVE Final   Influenza B by PCR NEGATIVE NEGATIVE Final    Comment: (NOTE) The Xpert Xpress SARS-CoV-2/FLU/RSV plus assay is intended as an aid in the diagnosis of influenza from Nasopharyngeal swab specimens and should not be used as a sole basis for treatment. Nasal washings and aspirates are unacceptable for Xpert Xpress SARS-CoV-2/FLU/RSV testing.  Fact Sheet for Patients: EntrepreneurPulse.com.au  Fact Sheet for Healthcare Providers: IncredibleEmployment.be  This test is not yet approved or cleared by the [place] FDA and has been authorized for detection and/or diagnosis of SARS-CoV-2 by FDA under an Emergency Use Authorization (EUA). This EUA will remain in effect (meaning this test can be used) for the duration of the COVID-19 declaration under Section 564(b)(1) of the Act, 21 U.S.C. section 360bbb-3(b)(1), unless the authorization is terminated or revoked.  Performed at [facility], [address] [place],  [address]       Management plans discussed with the patient, family and they are in agreement.  CODE STATUS:     Code Status Orders  (From admission, onward)           Start     Ordered   05/20/21  1545  Full code  Continuous        05/20/21 1550           Code Status History     Date Active Date Inactive Code Status Order ID Comments User Context   04/21/2021 0212 04/29/2021 1659 Full  Code [id number]  Sidney Ace Arvella Merles, MD ED   03/22/2021 1431 03/24/2021 2209 Full Code [id number]  Ivor Costa, MD ED   03/17/2021 1613 03/20/2021 2158 Full Code [id number]  Collier Bullock, MD ED   12/16/2020 1725 12/22/2020 2140 Full Code [id number]  Cox, Amy N, DO ED       TOTAL TIME TAKING CARE OF THIS PATIENT: 32 minutes.    Loletha Grayer M.D on 05/25/2021 at 11:29 AM   [facility]  CC: Primary care physician; Rusty Aus, MD

## 2021-06-02 LAB — BLOOD GAS, VENOUS
Acid-Base Excess: 10.2 mmol/L — ABNORMAL HIGH (ref 0.0–2.0)
Bicarbonate: 36 mmol/L — ABNORMAL HIGH (ref 20.0–28.0)
O2 Saturation: 45.7 %
Patient temperature: 37
pCO2, Ven: 53 mmHg (ref 44.0–60.0)
pH, Ven: 7.44 — ABNORMAL HIGH (ref 7.250–7.430)

## 2021-06-29 ENCOUNTER — Ambulatory Visit: Payer: Medicare Other | Admitting: Family

## 2021-06-29 NOTE — Progress Notes (Signed)
Patient ID: Christopher [name], male    DOB: 07-02-1951, 70 y.o.   MRN: [id number]  HPI  [name] is a 70 y/o male with a history of HTN, thyroid disease, anxiety, COPD, depression, hyperlipidemia, sleep apnea and chronic heart failure.   Echo report from 04/21/21 reviewed and showed and EF of 60-65% along with mild MR.   Admitted 05/20/21 due to systemic inflammatory response syndrome. Treated with antibiotics. Midodrine started due to hypotension. Brief PAF but no anticoagulation due to recent head trauma. Discharged after 5 days. Was in the ED 05/19/21 due to [facility] fall at [facility] where he fell forward hitting his face. No head fracture of bleed noted on CT. Small nasal fracture noted. He was released. Admitted 04/20/21 due to AMS and shortness of breath. Placed on bipap and given IV diuretics & IV steroids. Cardiology and nephrology consults obtained. Meds transitioned to oral route. Placed on midodrine for hypotension. Given veltassa for hyperkalemia. Discharged after 9 days.   He presents today for a follow-up visit with a chief complaint of minimal fatigue with moderate exertion. He describes this as chronic in nature having been present for several years. He has associated shortness of breath, pedal edema & depression along with this. He denies any difficulty sleeping, dizziness, cough, chest pain, palpitations or abdominal distention.   Says that he just got home from rehab last week. His wife passed away 2 weeks ago  Past Medical History:  Diagnosis Date   Anxiety    CHF (congestive heart failure) (HCC)    COPD (chronic obstructive pulmonary disease) (HCC)    Depression    Dysrhythmia    Hyperlipidemia    Hypertension    Hypothyroidism    Sleep apnea    Past Surgical History:  Procedure Laterality Date   FRACTURE SURGERY     ORIF ANKLE FRACTURE Left 12/17/2020   Procedure: OPEN REDUCTION INTERNAL FIXATION (ORIF) ANKLE FRACTURE;  Surgeon: Samara Deist, DPM;  Location: [facility] ORS;   Service: Podiatry;  Laterality: Left;   Family History  Problem Relation Age of Onset   Hypertension Mother    Social History   Tobacco Use   Smoking status: Never   Smokeless tobacco: Never  Substance Use Topics   Alcohol use: Not Currently   Allergies  Allergen Reactions   Albuterol Other (See Comments)    Causes afib    Gramineae Pollens Other (See Comments)    Sneeze and watery eyes Sneeze and watery eyes   Other Other (See Comments)    Other reaction(s): Other (See Comments) Unable to breath when around cats Other reaction(s): Other (See Comments) Unable to breath when around cats   Prior to Admission medications   Medication Sig Start Date End Date Taking? Authorizing Provider  acetaminophen (TYLENOL) 325 MG tablet Take 2 tablets (650 mg total) by mouth every 6 (six) hours as needed for mild pain, fever, headache or moderate pain. 05/23/21  Yes Wieting, Richard, MD  aspirin 81 MG EC tablet Take 81 mg by mouth at bedtime.   Yes [provider]  atorvastatin (LIPITOR) 20 MG tablet Take 20 mg by mouth at bedtime. 10/06/20  Yes [provider]  buPROPion (WELLBUTRIN SR) 150 MG 12 hr tablet Take 150 mg by mouth 2 (two) times daily. After 1st meal of day and after dinner 12/02/20  Yes [provider]  Cholecalciferol 25 MCG (1000 UT) capsule Take 1,000 Units by mouth at bedtime.   Yes [provider]  cyanocobalamin 1000  MCG tablet Take 1,000 mcg by mouth 2 (two) times a week. At bedtime. Sundays and Wednesdays   Yes [provider]  Ensure Max Protein (ENSURE MAX PROTEIN) LIQD Take 330 mLs (11 oz total) by mouth 2 (two) times daily. 03/19/21  Yes Lorella Nimrod, MD  hydrocortisone cream 1 % Apply topically as needed for itching. 05/25/21  Yes Loletha Grayer, MD  levalbuterol [facility] HFA) 45 MCG/ACT inhaler Inhale 2 puffs into the lungs every 4 (four) hours as needed for wheezing.   Yes [provider]  levalbuterol (XOPENEX)  0.63 MG/3ML nebulizer solution Take 3 mLs (0.63 mg total) by nebulization every 6 (six) hours as needed for wheezing or shortness of breath. 03/24/21  Yes Fritzi Mandes, MD  levothyroxine (SYNTHROID) 88 MCG tablet Take 88 mcg by mouth daily. 30 to 60 minutes before breakfast on an empty stomach and with a glass of water 12/02/20  Yes [provider]  loratadine (CLARITIN) 10 MG tablet Take 10 mg by mouth daily.   Yes [provider]  midodrine (PROAMATINE) 10 MG tablet Take 1 tablet (10 mg total) by mouth 3 (three) times daily with meals. 05/23/21  Yes Wieting, Richard, MD  Multiple Vitamins-Minerals (PRESERVISION AREDS 2) CAPS Take 1 capsule by mouth 2 (two) times daily. With food. After 1st meal of day and after dinner   Yes [provider]  mupirocin ointment (BACTROBAN) 2 % Apply topically daily. 03/19/21  Yes Lorella Nimrod, MD  nystatin (MYCOSTATIN/NYSTOP) powder Apply topically 3 (three) times daily. 05/25/21  Yes Wieting, Richard, MD  OXYGEN Inhale 2 L into the lungs continuous.   Yes [provider]  potassium chloride SA (KLOR-CON) 20 MEQ tablet Take 1 tablet (20 mEq total) by mouth daily. 05/24/21  Yes Wieting, Richard, MD  torsemide (DEMADEX) 20 MG tablet Take 1 tablet (20 mg total) by mouth daily. 05/24/21  Yes Wieting, Richard, MD  trolamine salicylate (ASPERCREME) 10 % cream Apply 1 application topically as needed for muscle pain. Apply to knees and feet   Yes [provider]  Vitamins A & D (VITAMIN A & D) ointment Apply 1 application topically as needed. To groin area.   Yes [provider]  hydrOXYzine (ATARAX/VISTARIL) 10 MG tablet Take 1 tablet (10 mg total) by mouth 3 (three) times daily as needed for itching. Patient not taking: Reported on 06/30/2021 05/23/21   Loletha Grayer, MD   Review of Systems  Constitutional:  Positive for fatigue. Negative for appetite change.  HENT:  Negative for congestion, postnasal drip and sore throat.    Eyes: Negative.   Respiratory:  Positive for shortness of breath. Negative for cough and chest tightness.   Cardiovascular:  Positive for leg swelling. Negative for chest pain and palpitations.  Gastrointestinal:  Negative for abdominal distention and abdominal pain.  Endocrine: Negative.   Genitourinary: Negative.   Musculoskeletal:  Positive for arthralgias (both knees hurt). Negative for back pain and neck pain.  Skin: Negative.   Allergic/Immunologic: Negative.   Neurological:  Negative for dizziness and light-headedness.  Hematological:  Negative for adenopathy. Does not bruise/bleed easily.  Psychiatric/Behavioral:  Positive for dysphoric mood. Negative for sleep disturbance (sleeping on 1 pillow; wearing CPAP nightly). The patient is not nervous/anxious.    Vitals:   06/30/21 1328  BP: 124/65  Pulse: 85  Resp: 20  SpO2: 93%  Weight: (!) 333 lb 8 oz (151.3 kg)  Height: 6' (1.829 m)   Wt Readings from Last 3 Encounters:  06/30/21 Marland Kitchen)  333 lb 8 oz (151.3 kg)  05/21/21 (!) 332 lb 7.3 oz (150.8 kg)  05/19/21 (!) 335 lb (152 kg)   Lab Results  Component Value Date   CREATININE 1.49 (H) 05/25/2021   CREATININE 1.36 (H) 05/24/2021   CREATININE 2.28 (H) 05/22/2021   Physical Exam Vitals and nursing note reviewed. Exam conducted with a chaperone present (daughter).  Constitutional:      Appearance: Normal appearance.  HENT:     Head: Normocephalic and atraumatic.  Cardiovascular:     Rate and Rhythm: Normal rate and regular rhythm.  Pulmonary:     Effort: Pulmonary effort is normal. No respiratory distress.     Breath sounds: No wheezing or rales.  Abdominal:     General: There is no distension.     Palpations: Abdomen is soft.  Musculoskeletal:        General: No tenderness.     Cervical back: Normal range of motion and neck supple.     Right lower leg: Edema (2+ pitting) present.     Left lower leg: Edema (1+ pitting) present.  Skin:    General: Skin is warm and  dry.  Neurological:     General: No focal deficit present.     Mental Status: He is alert and oriented to person, place, and time.  Psychiatric:        Mood and Affect: Mood normal.        Behavior: Behavior normal.        Thought Content: Thought content normal.   Assessment & Plan:  1: Chronic heart failure with preserved ejection fraction without structural changes- - NYHA class II - euvolemic today - not weighing daily but does have working scales at home; instructed to call for an overnight weight gain of >2 pounds or a weekly weight gain of > 5 pounds - going to start PT at home - not adding salt to his food and his daughter doesn't cook with salt either - saw cardiology (Paraschos) during admission - drinking ~ 1/2 gallon of fluids daily - BNP 05/20/21 was 242.5  2: HTN- - BP looks good (124/65) - saw PCP Sabra Heck) 03/17/21; returns next week - BMP 06/29/21 reviewed and showed sodium 139, potassium 5.0, creatinine 1.9 and GFR 35  3: Obstructive sleep apnea- - saw pulmonology Lanney Gins) 09/30/20 - wearing CPAP nightly - wearing oxygen at 2L PRN during the day and at bedtime  4: Lymphedema- - stage 2 - limited in his ability to exercise due to frequent falls - has worn compression socks in the past but admits to not wearing them consistently; instructed to wear them daily with removal at bedtime - if the socks are too tight, he can try using ACE wraps instead - elevate legs when sitting for long periods of time - consider lymphapress compression boots if edema persists after above measures; brochure provided   Medication list reviewed.   Return in 6 weeks or sooner for any questions/problems before then.    Patient did not bring his medications nor a list. Each medication was verbally reviewed with the patient and he was encouraged to bring the bottles to every visit to confirm accuracy of list.

## 2021-06-30 ENCOUNTER — Encounter: Payer: Self-pay | Admitting: Family

## 2021-06-30 ENCOUNTER — Ambulatory Visit: Payer: Medicare Other | Attending: Family | Admitting: Family

## 2021-06-30 ENCOUNTER — Other Ambulatory Visit: Payer: Self-pay

## 2021-06-30 VITALS — BP 124/65 | HR 85 | Resp 20 | Ht 72.0 in | Wt 333.5 lb

## 2021-06-30 DIAGNOSIS — E039 Hypothyroidism, unspecified: Secondary | ICD-10-CM | POA: Insufficient documentation

## 2021-06-30 DIAGNOSIS — J449 Chronic obstructive pulmonary disease, unspecified: Secondary | ICD-10-CM | POA: Insufficient documentation

## 2021-06-30 DIAGNOSIS — R296 Repeated falls: Secondary | ICD-10-CM | POA: Insufficient documentation

## 2021-06-30 DIAGNOSIS — E785 Hyperlipidemia, unspecified: Secondary | ICD-10-CM | POA: Diagnosis not present

## 2021-06-30 DIAGNOSIS — I959 Hypotension, unspecified: Secondary | ICD-10-CM | POA: Insufficient documentation

## 2021-06-30 DIAGNOSIS — Z634 Disappearance and death of family member: Secondary | ICD-10-CM | POA: Insufficient documentation

## 2021-06-30 DIAGNOSIS — F419 Anxiety disorder, unspecified: Secondary | ICD-10-CM | POA: Insufficient documentation

## 2021-06-30 DIAGNOSIS — G4733 Obstructive sleep apnea (adult) (pediatric): Secondary | ICD-10-CM

## 2021-06-30 DIAGNOSIS — I5032 Chronic diastolic (congestive) heart failure: Secondary | ICD-10-CM | POA: Insufficient documentation

## 2021-06-30 DIAGNOSIS — Z9989 Dependence on other enabling machines and devices: Secondary | ICD-10-CM | POA: Insufficient documentation

## 2021-06-30 DIAGNOSIS — I890 Lymphedema, not elsewhere classified: Secondary | ICD-10-CM | POA: Diagnosis not present

## 2021-06-30 DIAGNOSIS — I110 Hypertensive heart disease with heart failure: Secondary | ICD-10-CM | POA: Diagnosis present

## 2021-06-30 DIAGNOSIS — F32A Depression, unspecified: Secondary | ICD-10-CM | POA: Insufficient documentation

## 2021-06-30 DIAGNOSIS — I480 Paroxysmal atrial fibrillation: Secondary | ICD-10-CM | POA: Insufficient documentation

## 2021-06-30 DIAGNOSIS — I10 Essential (primary) hypertension: Secondary | ICD-10-CM | POA: Diagnosis not present

## 2021-06-30 DIAGNOSIS — E875 Hyperkalemia: Secondary | ICD-10-CM | POA: Insufficient documentation

## 2021-06-30 NOTE — Patient Instructions (Addendum)
Begin weighing daily and call for an overnight weight gain of > 2 pounds or a weekly weight gain of >5 pounds. 

## 2021-08-11 ENCOUNTER — Other Ambulatory Visit: Payer: Self-pay

## 2021-08-11 ENCOUNTER — Ambulatory Visit: Payer: Medicare Other | Attending: Family | Admitting: Family

## 2021-08-11 ENCOUNTER — Encounter: Payer: Self-pay | Admitting: Family

## 2021-08-11 VITALS — BP 121/66 | HR 85 | Resp 18 | Ht 71.0 in | Wt 328.2 lb

## 2021-08-11 DIAGNOSIS — G4733 Obstructive sleep apnea (adult) (pediatric): Secondary | ICD-10-CM

## 2021-08-11 DIAGNOSIS — M25569 Pain in unspecified knee: Secondary | ICD-10-CM | POA: Diagnosis not present

## 2021-08-11 DIAGNOSIS — F419 Anxiety disorder, unspecified: Secondary | ICD-10-CM | POA: Insufficient documentation

## 2021-08-11 DIAGNOSIS — I10 Essential (primary) hypertension: Secondary | ICD-10-CM | POA: Diagnosis not present

## 2021-08-11 DIAGNOSIS — I480 Paroxysmal atrial fibrillation: Secondary | ICD-10-CM | POA: Insufficient documentation

## 2021-08-11 DIAGNOSIS — I890 Lymphedema, not elsewhere classified: Secondary | ICD-10-CM

## 2021-08-11 DIAGNOSIS — J449 Chronic obstructive pulmonary disease, unspecified: Secondary | ICD-10-CM | POA: Diagnosis not present

## 2021-08-11 DIAGNOSIS — I110 Hypertensive heart disease with heart failure: Secondary | ICD-10-CM | POA: Insufficient documentation

## 2021-08-11 DIAGNOSIS — I5032 Chronic diastolic (congestive) heart failure: Secondary | ICD-10-CM

## 2021-08-11 DIAGNOSIS — E039 Hypothyroidism, unspecified: Secondary | ICD-10-CM | POA: Diagnosis not present

## 2021-08-11 DIAGNOSIS — F32A Depression, unspecified: Secondary | ICD-10-CM | POA: Insufficient documentation

## 2021-08-11 DIAGNOSIS — R296 Repeated falls: Secondary | ICD-10-CM | POA: Insufficient documentation

## 2021-08-11 NOTE — Patient Instructions (Signed)
Continue weighing daily and call for an overnight weight gain of 3 pounds or more or a weekly weight gain of more than 5 pounds.  °

## 2021-08-11 NOTE — Progress Notes (Signed)
Patient ID: Christopher [name], male    DOB: [birth year], 70 y.o.   MRN: [id number]  HPI  Christopher [name] is a 70 y/o male with a history of HTN, thyroid disease, anxiety, COPD, depression, hyperlipidemia, sleep apnea and chronic heart failure.   Echo report from 04/21/21 reviewed and showed and EF of 60-65% along with mild Christopher.   Admitted 05/20/21 due to systemic inflammatory response syndrome. Treated with antibiotics. Midodrine started due to hypotension. Brief PAF but no anticoagulation due to recent head trauma. Discharged after 5 days. Was in the ED 05/19/21 due to mechanical fall at SNF where Christopher [name] fell forward hitting Christopher [name] face. No head fracture of bleed noted on CT. Small nasal fracture noted. Christopher [name] was released. Admitted 04/20/21 due to AMS and shortness of breath. Placed on bipap and given IV diuretics & IV steroids. Cardiology and nephrology consults obtained. Meds transitioned to oral route. Placed on midodrine for hypotension. Given veltassa for hyperkalemia. Discharged after 9 days.   Christopher [name] presents today for a follow-up visit with a chief complaint of minimal shortness of breath upon moderate exertion. Christopher [name] describes this as chronic in nature having been present for several years. Christopher [name] has associated fatigue, pedal edema, depression and knee pain along with this. Christopher [name] denies any difficulty sleeping, dizziness, abdominal distention, palpitations, chest pain, cough or weight gain.   Hasn't started Christopher [name] exercise rehab yet as Christopher [name] and Christopher [name] daughter are wanting to get through the first holiday without Christopher [name] wife. Christopher [name] has been trying to be more active at home.   Past Medical History:  Diagnosis Date   Anxiety    CHF (congestive heart failure) (HCC)    COPD (chronic obstructive pulmonary disease) ([place])    Depression    Dysrhythmia    Hyperlipidemia    Hypertension    Hypothyroidism    Sleep apnea    Past Surgical History:  Procedure Laterality Date   FRACTURE SURGERY     ORIF ANKLE FRACTURE Left 12/17/2020    Procedure: OPEN REDUCTION INTERNAL FIXATION (ORIF) ANKLE FRACTURE;  Surgeon: Samara Deist, DPM;  Location: [facility] OR[facility];  Service: Podiatry;  Laterality: Left;   Family History  Problem Relation Age of Onset   Hypertension Mother    Social History   Tobacco Use   Smoking status: Never   Smokeless tobacco: Never  Substance Use Topics   Alcohol use: Not Currently   Allergies  Allergen Reactions   Albuterol Other (See Comments)    Causes afib    Gramineae Pollens Other (See Comments)    Sneeze and watery eyes Sneeze and watery eyes   Other Other (See Comments)    Other reaction(s): Other (See Comments) Unable to breath when around cats Other reaction(s): Other (See Comments) Unable to breath when around cats   Prior to Admission medications   Medication Sig Start Date End Date Taking? Authorizing Provider  acetaminophen (TYLENOL) 325 MG tablet Take 2 tablets (650 mg total) by mouth every 6 (six) hours as needed for mild pain, fever, headache or moderate pain. 05/23/21  Yes Wieting, Richard, MD  aspirin 81 MG EC tablet Take 81 mg by mouth at bedtime.   Yes [provider]  atorvastatin (LIPITOR) 20 MG tablet Take 20 mg by mouth at bedtime. 10/06/20  Yes [provider]  buPROPion (WELLBUTRIN SR) 150 MG 12 hr tablet Take 150 mg by mouth 2 (two) times daily. After 1st meal of day and after dinner 12/02/20  Yes [provider]  Cholecalciferol 25  MCG (1000 UT) capsule Take 1,000 Units by mouth at bedtime.   Yes [provider]  cyanocobalamin 1000 MCG tablet Take 1,000 mcg by mouth 2 (two) times a week. At bedtime. Sundays and Wednesdays   Yes [provider]  hydrocortisone cream 1 % Apply topically as needed for itching. 05/25/21  Yes Loletha Grayer, MD  levalbuterol [facility] HFA) 45 MCG/ACT inhaler Inhale 2 puffs into the lungs every 4 (four) hours as needed for wheezing.   Yes [provider]  levalbuterol (XOPENEX) 0.63 MG/3ML  nebulizer solution Take 3 mLs (0.63 mg total) by nebulization every 6 (six) hours as needed for wheezing or shortness of breath. 03/24/21  Yes Fritzi Mandes, MD  levothyroxine (SYNTHROID) 88 MCG tablet Take 88 mcg by mouth daily. 30 to 60 minutes before breakfast on an empty stomach and with a glass of water 12/02/20  Yes [provider]  loratadine (CLARITIN) 10 MG tablet Take 10 mg by mouth daily.   Yes [provider]  midodrine (PROAMATINE) 10 MG tablet Take 1 tablet (10 mg total) by mouth 3 (three) times daily with meals. 05/23/21  Yes Wieting, Richard, MD  Multiple Vitamins-Minerals (PRESERVISION AREDS 2) CAPS Take 1 capsule by mouth 2 (two) times daily. With food. After 1st meal of day and after dinner   Yes [provider]  OXYGEN Inhale 2 L into the lungs continuous.   Yes [provider]  potassium chloride SA (KLOR-CON) 20 MEQ tablet Take 1 tablet (20 mEq total) by mouth daily. 05/24/21  Yes Wieting, Richard, MD  torsemide (DEMADEX) 20 MG tablet Take 1 tablet (20 mg total) by mouth daily. 05/24/21  Yes Wieting, Richard, MD  trolamine salicylate (ASPERCREME) 10 % cream Apply 1 application topically as needed for muscle pain. Apply to knees and feet   Yes [provider]  Vitamins A & D (VITAMIN A & D) ointment Apply 1 application topically as needed. To groin area.   Yes [provider]   Review of Systems  Constitutional:  Positive for fatigue. Negative for appetite change.  HENT:  Negative for congestion, postnasal drip and sore throat.   Eyes: Negative.   Respiratory:  Positive for shortness of breath. Negative for cough and chest tightness.   Cardiovascular:  Positive for leg swelling. Negative for chest pain and palpitations.  Gastrointestinal:  Negative for abdominal distention and abdominal pain.  Endocrine: Negative.   Genitourinary: Negative.   Musculoskeletal:  Positive for arthralgias (both knees hurt). Negative for back pain and  neck pain.  Skin: Negative.   Allergic/Immunologic: Negative.   Neurological:  Negative for dizziness and light-headedness.  Hematological:  Negative for adenopathy. Does not bruise/bleed easily.  Psychiatric/Behavioral:  Positive for dysphoric mood. Negative for sleep disturbance (sleeping on 1 pillow; wearing CPAP nightly). The patient is not nervous/anxious.    Vitals:   08/11/21 1312  BP: 121/66  Pulse: 85  Resp: 18  SpO2: 99%  Weight: (!) 328 lb 4 oz (148.9 kg)  Height: 5\' 11"  (1.803 m)   Wt Readings from Last 3 Encounters:  08/11/21 (!) 328 lb 4 oz (148.9 kg)  06/30/21 (!) 333 lb 8 oz (151.3 kg)  05/21/21 (!) 332 lb 7.3 oz (150.8 kg)   Lab Results  Component Value Date   CREATININE 1.49 (H) 05/25/2021   CREATININE 1.36 (H) 05/24/2021   CREATININE 2.28 (H) 05/22/2021    Physical Exam Vitals and nursing note reviewed. Exam conducted with a chaperone present (daughter).  Constitutional:  Appearance: Normal appearance.  HENT:     Head: Normocephalic and atraumatic.  Cardiovascular:     Rate and Rhythm: Normal rate and regular rhythm.  Pulmonary:     Effort: Pulmonary effort is normal. No respiratory distress.     Breath sounds: No wheezing or rales.  Abdominal:     General: There is no distension.     Palpations: Abdomen is soft.  Musculoskeletal:        General: No tenderness.     Cervical back: Normal range of motion and neck supple.     Right lower leg: Edema (1+ pitting) present.     Left lower leg: Edema (1+ pitting) present.  Skin:    General: Skin is warm and dry.  Neurological:     General: No focal deficit present.     Mental Status: Christopher [name] is alert and oriented to person, place, and time.  Psychiatric:        Mood and Affect: Mood normal.        Behavior: Behavior normal.        Thought Content: Thought content normal.   Assessment & Plan:  1: Chronic heart failure with preserved ejection fraction without structural changes- - NYHA class II -  euvolemic today - not weighing daily but does have working scales at home; reminded to call for an overnight weight gain of >2 pounds or a weekly weight gain of > 5 pounds - weight down 5 pounds from last visit here 6 weeks ago - trying to be more active at home and is planning on starting rehab after the holidays - not adding salt to Christopher [name] food and Christopher [name] daughter doesn't cook with salt either - saw cardiology (Paraschos) during admission - drinking ~ 1/2 gallon of fluids daily - BNP 05/20/21 was 242.5  2: HTN- - BP looks good (121/66) - saw PCP Sabra Heck) 07/06/21 - BMP 06/29/21 reviewed and showed sodium 139, potassium 5.0, creatinine 1.9 and GFR 35  3: Obstructive sleep apnea- - saw pulmonology Lanney Gins) 07/03/21 - wearing CPAP nightly - wearing oxygen at 2L PRN during the day and at bedtime  4: Lymphedema- - stage 2 - limited in Christopher [name] ability to exercise due to frequent falls - tried wearing compression socks but even with a bigger size, they hurt Christopher [name] legs - has been elevating Christopher [name] legs when sitting for long periods of time but edema persists - will make referral for compression boots and patient is agreeable   Medication list reviewed.   Return in 4 months or sooner for any questions/problems before then.

## 2021-10-15 ENCOUNTER — Inpatient Hospital Stay
Admission: EM | Admit: 2021-10-15 | Discharge: 2021-10-20 | DRG: 872 | Disposition: A | Discharge status: Home Health | Payer: Medicare Other | Attending: Internal Medicine | Admitting: Internal Medicine

## 2021-10-15 ENCOUNTER — Emergency Department: Payer: Medicare Other

## 2021-10-15 DIAGNOSIS — R531 Weakness: Secondary | ICD-10-CM | POA: Diagnosis not present

## 2021-10-15 DIAGNOSIS — Z8249 Family history of ischemic heart disease and other diseases of the circulatory system: Secondary | ICD-10-CM

## 2021-10-15 DIAGNOSIS — Z20822 Contact with and (suspected) exposure to covid-19: Secondary | ICD-10-CM | POA: Diagnosis present

## 2021-10-15 DIAGNOSIS — G4730 Sleep apnea, unspecified: Secondary | ICD-10-CM | POA: Diagnosis present

## 2021-10-15 DIAGNOSIS — E039 Hypothyroidism, unspecified: Secondary | ICD-10-CM | POA: Diagnosis present

## 2021-10-15 DIAGNOSIS — R509 Fever, unspecified: Secondary | ICD-10-CM | POA: Diagnosis present

## 2021-10-15 DIAGNOSIS — J9611 Chronic respiratory failure with hypoxia: Secondary | ICD-10-CM

## 2021-10-15 DIAGNOSIS — A419 Sepsis, unspecified organism: Secondary | ICD-10-CM | POA: Diagnosis present

## 2021-10-15 DIAGNOSIS — J449 Chronic obstructive pulmonary disease, unspecified: Secondary | ICD-10-CM | POA: Diagnosis present

## 2021-10-15 DIAGNOSIS — J9612 Chronic respiratory failure with hypercapnia: Secondary | ICD-10-CM | POA: Diagnosis present

## 2021-10-15 DIAGNOSIS — Z888 Allergy status to other drugs, medicaments and biological substances status: Secondary | ICD-10-CM

## 2021-10-15 DIAGNOSIS — B9689 Other specified bacterial agents as the cause of diseases classified elsewhere: Secondary | ICD-10-CM | POA: Diagnosis present

## 2021-10-15 DIAGNOSIS — R6520 Severe sepsis without septic shock: Secondary | ICD-10-CM | POA: Diagnosis present

## 2021-10-15 DIAGNOSIS — Z6841 Body Mass Index (BMI) 40.0 and over, adult: Secondary | ICD-10-CM

## 2021-10-15 DIAGNOSIS — J301 Allergic rhinitis due to pollen: Secondary | ICD-10-CM | POA: Diagnosis present

## 2021-10-15 DIAGNOSIS — I5032 Chronic diastolic (congestive) heart failure: Secondary | ICD-10-CM | POA: Diagnosis present

## 2021-10-15 DIAGNOSIS — Z79890 Hormone replacement therapy (postmenopausal): Secondary | ICD-10-CM

## 2021-10-15 DIAGNOSIS — E785 Hyperlipidemia, unspecified: Secondary | ICD-10-CM | POA: Diagnosis present

## 2021-10-15 DIAGNOSIS — R6510 Systemic inflammatory response syndrome (SIRS) of non-infectious origin without acute organ dysfunction: Secondary | ICD-10-CM

## 2021-10-15 DIAGNOSIS — F32A Depression, unspecified: Secondary | ICD-10-CM | POA: Diagnosis present

## 2021-10-15 DIAGNOSIS — Z79899 Other long term (current) drug therapy: Secondary | ICD-10-CM | POA: Diagnosis not present

## 2021-10-15 DIAGNOSIS — Z7982 Long term (current) use of aspirin: Secondary | ICD-10-CM | POA: Diagnosis not present

## 2021-10-15 DIAGNOSIS — I130 Hypertensive heart and chronic kidney disease with heart failure and stage 1 through stage 4 chronic kidney disease, or unspecified chronic kidney disease: Secondary | ICD-10-CM | POA: Diagnosis present

## 2021-10-15 DIAGNOSIS — F419 Anxiety disorder, unspecified: Secondary | ICD-10-CM | POA: Diagnosis present

## 2021-10-15 DIAGNOSIS — N1832 Chronic kidney disease, stage 3b: Secondary | ICD-10-CM | POA: Diagnosis present

## 2021-10-15 LAB — PROTIME-INR
INR: 1.1 (ref 0.8–1.2)
Prothrombin Time: 13.9 seconds (ref 11.4–15.2)

## 2021-10-15 LAB — COMPREHENSIVE METABOLIC PANEL
ALT: 8 U/L (ref 0–44)
AST: 18 U/L (ref 15–41)
Albumin: 3.9 g/dL (ref 3.5–5.0)
Alkaline Phosphatase: 60 U/L (ref 38–126)
Anion gap: 13 (ref 5–15)
BUN: 31 mg/dL — ABNORMAL HIGH (ref 8–23)
CO2: 27 mmol/L (ref 22–32)
Calcium: 9.4 mg/dL (ref 8.9–10.3)
Chloride: 97 mmol/L — ABNORMAL LOW (ref 98–111)
Creatinine, Ser: 1.75 mg/dL — ABNORMAL HIGH (ref 0.61–1.24)
GFR, Estimated: 41 mL/min — ABNORMAL LOW (ref >60)
Glucose, Bld: 132 mg/dL — ABNORMAL HIGH (ref 70–99)
Potassium: 4.5 mmol/L (ref 3.5–5.1)
Sodium: 137 mmol/L (ref 135–145)
Total Bilirubin: 1.1 mg/dL (ref 0.3–1.2)
Total Protein: 7.7 g/dL (ref 6.5–8.1)

## 2021-10-15 LAB — RESP PANEL BY RT-PCR (FLU A&B, COVID) ARPGX2
Influenza A by PCR: NEGATIVE
Influenza B by PCR: NEGATIVE
SARS Coronavirus 2 by RT PCR: NEGATIVE

## 2021-10-15 LAB — CBC WITH DIFFERENTIAL/PLATELET
Abs Immature Granulocytes: 0.23 10*3/uL — ABNORMAL HIGH (ref 0.00–0.07)
Basophils Absolute: 0.1 10*3/uL (ref 0.0–0.1)
Basophils Relative: 0 %
Eosinophils Absolute: 0 10*3/uL (ref 0.0–0.5)
Eosinophils Relative: 0 %
HCT: 42.4 % (ref 39.0–52.0)
Hemoglobin: 13 g/dL (ref 13.0–17.0)
Immature Granulocytes: 1 %
Lymphocytes Relative: 3 %
Lymphs Abs: 0.6 10*3/uL — ABNORMAL LOW (ref 0.7–4.0)
MCH: 25.3 pg — ABNORMAL LOW (ref 26.0–34.0)
MCHC: 30.7 g/dL (ref 30.0–36.0)
MCV: 82.5 fL (ref 80.0–100.0)
Monocytes Absolute: 0.8 10*3/uL (ref 0.1–1.0)
Monocytes Relative: 4 %
Neutro Abs: 20.4 10*3/uL — ABNORMAL HIGH (ref 1.7–7.7)
Neutrophils Relative %: 92 %
Platelets: 315 10*3/uL (ref 150–400)
RBC: 5.14 MIL/uL (ref 4.22–5.81)
RDW: 15.6 % — ABNORMAL HIGH (ref 11.5–15.5)
WBC: 22.1 10*3/uL — ABNORMAL HIGH (ref 4.0–10.5)
nRBC: 0 % (ref 0.0–0.2)

## 2021-10-15 LAB — URINALYSIS, COMPLETE (UACMP) WITH MICROSCOPIC
Bacteria, UA: NONE SEEN
Bilirubin Urine: NEGATIVE
Glucose, UA: NEGATIVE mg/dL
Hgb urine dipstick: NEGATIVE
Ketones, ur: NEGATIVE mg/dL
Nitrite: NEGATIVE
Protein, ur: NEGATIVE mg/dL
Specific Gravity, Urine: 1.013 (ref 1.005–1.030)
pH: 5 (ref 5.0–8.0)

## 2021-10-15 LAB — LACTIC ACID, PLASMA: Lactic Acid, Venous: 2.2 mmol/L (ref 0.5–1.9)

## 2021-10-15 LAB — BRAIN NATRIURETIC PEPTIDE: B Natriuretic Peptide: 96.7 pg/mL (ref 0.0–100.0)

## 2021-10-15 LAB — APTT: aPTT: 33 seconds (ref 24–36)

## 2021-10-15 LAB — MAGNESIUM: Magnesium: 1.9 mg/dL (ref 1.7–2.4)

## 2021-10-15 MED ORDER — ONDANSETRON HCL 4 MG/2ML IJ SOLN
4.0000 mg | INTRAMUSCULAR | Status: AC
  Administered 2021-10-15: 4 mg via INTRAVENOUS
  Filled 2021-10-15: qty 2

## 2021-10-15 MED ORDER — ACETAMINOPHEN 650 MG RE SUPP: 650.0000 mg | Freq: Four times a day (QID) | RECTAL | Status: DC | PRN

## 2021-10-15 MED ORDER — SODIUM CHLORIDE 0.9 % IV SOLN
2.0000 g | Freq: Once | INTRAVENOUS | Status: AC
  Administered 2021-10-15: 2 g via INTRAVENOUS
  Filled 2021-10-15: qty 2

## 2021-10-15 MED ORDER — ACETAMINOPHEN 325 MG PO TABS
650.0000 mg | ORAL_TABLET | Freq: Four times a day (QID) | ORAL | Status: DC | PRN
  Administered 2021-10-17: 650 mg via ORAL
  Filled 2021-10-15 (×3): qty 2

## 2021-10-15 MED ORDER — SODIUM CHLORIDE 0.9 % IV SOLN
2.0000 g | Freq: Two times a day (BID) | INTRAVENOUS | Status: DC
  Administered 2021-10-16 – 2021-10-18 (×6): 2 g via INTRAVENOUS
  Filled 2021-10-15 (×8): qty 2

## 2021-10-15 MED ORDER — LACTATED RINGERS IV BOLUS (SEPSIS)
500.0000 mL | Freq: Once | INTRAVENOUS | Status: AC
  Administered 2021-10-15: 500 mL via INTRAVENOUS

## 2021-10-15 MED ORDER — LACTATED RINGERS IV SOLN: INTRAVENOUS | Status: AC

## 2021-10-15 MED ORDER — ACETAMINOPHEN 10 MG/ML IV SOLN
1000.0000 mg | INTRAVENOUS | Status: AC
  Administered 2021-10-15: 1000 mg via INTRAVENOUS
  Filled 2021-10-15: qty 100

## 2021-10-15 NOTE — ED Triage Notes (Signed)
Report per EMS, slid of recliner today, was called due to weakness and not being able to stand up. Pt on 4 liters nasal cannula. Wears at night and as needed. 101 temp per EMS.

## 2021-10-15 NOTE — Sepsis Progress Note (Signed)
Following per sepsis protocol   

## 2021-10-15 NOTE — ED Notes (Signed)
X-ray at bedside

## 2021-10-15 NOTE — Progress Notes (Signed)
PHARMACY -  BRIEF ANTIBIOTIC NOTE   Pharmacy has received consult(s) for cefepime from an ED provider.  The patient's profile has been reviewed for ht/wt/allergies/indication/available labs.    One time order(s) placed for cefepime 2 grams IV x 1  Further antibiotics/pharmacy consults should be ordered by admitting physician if indicated.                       Thank you, Dallie Piles 10/15/2021  8:56 PM

## 2021-10-15 NOTE — ED Provider Notes (Signed)
[facility] Provider Note    Event Date/Time   First MD Initiated Contact with Patient 10/15/21 2047     (approximate)   History   Weakness   HPI  Christopher [name] is a 71 y.o. male   history of COPD CHF hypertension hyperlipidemia morbid obesity   Patient sort of slipped out of his chair earlier and fire department helped him get back in.  But then later realized that he is continue to feel extremely weak and fatigued now.  Feels generally fatigued.  EMS called as difficulty standing due to weakness.  Denies any unilateral weakness, reports he feels very fatigued.  Patient is on oxygen 4 L EMS reports he uses this as needed at home.  Febrile with EMS  Patient reports feeling aches and pains throughout his body but no 1 specific spot.  Denies chest pain.  Does not feel short of breath.  Denies headache.  No fall or injury  Denies abdominal pain.      Physical Exam   Triage Vital Signs: ED Triage Vitals  Enc Vitals Group     BP 10/15/21 2045 129/70     Pulse Rate 10/15/21 2045 (!) 137     Resp 10/15/21 2045 20     Temp 10/15/21 2045 (!) 102.1 F (38.9 C)     Temp Source 10/15/21 2045 Oral     SpO2 10/15/21 2045 98 %     Weight 10/15/21 2046 (!) 310 lb (140.6 kg)     Height 10/15/21 2046 6' (1.829 m)     Head Circumference --      Peak Flow --      Pain Score --      Pain Loc --      Pain Edu? --      Excl. in [place]? --     Most recent vital signs: Vitals:   10/15/21 2230 10/16/21 0003  BP: 121/72   Pulse: (!) 110   Resp: (!) 26   Temp:    SpO2: 97% 98%     General: Awake, no distress.  Appears fatigued.  Tachycardic.  Generally weak but no acute focal deficits.  Moves all extremities to commands. CV:  Good peripheral perfusion.  Tachycardia mild, no murmurs rubs or gallops Resp:  Normal effort set for just slight tachypnea.  Denies chest pain.  Denies shortness of breath.  No noted cough.  Lung sounds are clear somewhat  distant Abd:  No distention.  Denies pain to palpation in any quadrant.  A small nontender umbilical hernia is present.  No pain to palpation in any quadrant of the abdomen.  No rebound or guarding Other:  Patient rolled with nursing assistance to check his back.  He does have evidence of likely very early pressure sore over his gluteal/sacral region but does not have significant associated skin breakdown at this time No noted skin ulcerations or open sores.   ED Results / Procedures / Treatments   Labs (all labs ordered are listed, but only abnormal results are displayed) Labs Reviewed  LACTIC ACID, PLASMA - Abnormal; Notable for the following components:      Result Value   Lactic Acid, Venous 2.2 (*)    All other components within normal limits  COMPREHENSIVE METABOLIC PANEL - Abnormal; Notable for the following components:   Chloride 97 (*)    Glucose, Bld 132 (*)    BUN 31 (*)    Creatinine, Ser 1.75 (*)    GFR,  Estimated 41 (*)    All other components within normal limits  CBC WITH DIFFERENTIAL/PLATELET - Abnormal; Notable for the following components:   WBC 22.1 (*)    MCH 25.3 (*)    RDW 15.6 (*)    Neutro Abs 20.4 (*)    Lymphs Abs 0.6 (*)    Abs Immature Granulocytes 0.23 (*)    All other components within normal limits  URINALYSIS, COMPLETE (UACMP) WITH MICROSCOPIC - Abnormal; Notable for the following components:   Color, Urine YELLOW (*)    APPearance CLEAR (*)    Leukocytes,Ua TRACE (*)    All other components within normal limits  RESP PANEL BY RT-PCR (FLU A&B, COVID) ARPGX2  CULTURE, BLOOD (ROUTINE X 2)  URINE CULTURE  CULTURE, BLOOD (ROUTINE X 2) W REFLEX TO ID PANEL  LACTIC ACID, PLASMA  PROTIME-INR  APTT  BRAIN NATRIURETIC PEPTIDE  MAGNESIUM  MAGNESIUM  COMPREHENSIVE METABOLIC PANEL  CBC WITH DIFFERENTIAL/PLATELET   CRITICAL CARE Performed by: Delman Kitten   Total critical care time: 25 minutes  Critical care time was exclusive of separately  billable procedures and treating other patients.  Critical care was necessary to treat or prevent imminent or life-threatening deterioration.  Critical care was time spent personally by me on the following activities: development of treatment plan with patient and/or surrogate as well as nursing, discussions with consultants, evaluation of patient's response to treatment, examination of patient, obtaining history from patient or surrogate, ordering and performing treatments and interventions, ordering and review of laboratory studies, ordering and review of radiographic studies, pulse oximetry and re-evaluation of patient's condition.   EKG  Reviewed inter by me at 2050 Heart rate 135 QRS 100 QTc 480 Sinus tachycardia, mild nonspecific T wave abnormality.  No evidence of acute ischemia denoted   RADIOLOGY DG Chest Port 1 View  Result Date: 10/15/2021 CLINICAL DATA:  Questionable sepsis. EXAM: PORTABLE CHEST 1 VIEW COMPARISON:  Chest radiograph dated 05/22/2021. FINDINGS: No focal consolidation, pleural effusion, pneumothorax. Mild cardiomegaly with mild vascular congestion. No acute osseous pathology. IMPRESSION: Mild cardiomegaly with mild central vascular congestion. No focal consolidation. Electronically Signed   By: Anner Crete M.D.   On: 10/15/2021 21:31     Chest x-ray interpreted by me and reviewed, mild cardiomegaly with vascular congestion.  No noted consolidation    PROCEDURES:  Critical Care performed: Yes, see critical care procedure note(s)  Procedures   MEDICATIONS ORDERED IN ED: Medications  lactated ringers infusion ( Intravenous New Bag/Given 10/15/21 2255)  acetaminophen (TYLENOL) tablet 650 mg (has no administration in time range)    Or  acetaminophen (TYLENOL) suppository 650 mg (has no administration in time range)  ceFEPIme (MAXIPIME) 2 g in sodium chloride 0.9 % 100 mL IVPB (has no administration in time range)  lactated ringers bolus 500 mL (0 mLs  Intravenous Stopped 10/15/21 2212)  ceFEPIme (MAXIPIME) 2 g in sodium chloride 0.9 % 100 mL IVPB (0 g Intravenous Stopped 10/15/21 2141)  ondansetron (ZOFRAN) injection 4 mg (4 mg Intravenous Given 10/15/21 2251)  acetaminophen (OFIRMEV) IV 1,000 mg (1,000 mg Intravenous New Bag/Given 10/15/21 2350)     IMPRESSION / MDM / ASSESSMENT AND PLAN / ED COURSE  I reviewed the triage vital signs and the nursing notes.                              Review of records including discharge summary from October 3 of last year  patient presented with fever and SIRS but no source of infection was identified.  Somewhat similar in nature to presentation today.  Differential diagnosis includes, but is not limited to, sepsis, infectious etiology, metabolic toxic or drug effect.  Denies chest pain no shortness of breath.  Some tachypnea, labs concerning for possible infectious etiology though a clear source is not yet identified.  No headache no signs or symptoms suggest acute meningitis.  I do not see evidence of acute skin breakdown or cellulitic change.  Etiology of infection is not clear at the time of admission decision but we will cover broadly initiating cefepime and defer to hospitalist internal medicine for further antibiotic escalation or tapering.  Patient does show improvement in vital signs with improving heart rate, normotensive, after receiving fluid bolus.  Plan to admit for further care work-up  The patient is on the cardiac monitor to evaluate for evidence of arrhythmia and/or significant heart rate changes.  Consultation with Dr. Velia Meyer, will admit for further workup of presentation/fever.  Impression is possible sepsis, but at this point source not yet identified.  No pleuritic chest pain no shortness of breath no clinical exam findings that would be highly suggestive of acute pulmonary embolism dissection or vascular emergency.     FINAL CLINICAL IMPRESSION(S) / ED DIAGNOSES   Final  diagnoses:  SIRS (systemic inflammatory response syndrome) (HCC)  Suspect infection/sepsis   Rx / DC Orders   ED Discharge Orders     None        Note:  This document was prepared using Dragon voice recognition software and may include unintentional dictation errors.   Delman Kitten, MD 10/16/21 [phone]

## 2021-10-15 NOTE — Progress Notes (Signed)
Pharmacy Antibiotic Note  Christopher [name] is a 71 y.o. male admitted on 10/15/2021 with sepsis.  Pharmacy has been consulted for Cefepime dosing.  Plan: Cefepime 2 gm q12h per indication and renal fxn.  Pharmacy will continue to follow and adjust abx dosing when warranted.  Height: 6' (182.9 cm) Weight: (!) 140.6 kg (310 lb) IBW/kg (Calculated) : 77.6  Temp (24hrs), Avg:102.1 F (38.9 C), Min:102.1 F (38.9 C), Max:102.1 F (38.9 C)  Recent Labs  Lab 10/15/21 2050  WBC 22.1*  CREATININE 1.75*  LATICACIDVEN 2.2*    Estimated Creatinine Clearance: 56.3 mL/min (A) (by C-G formula based on SCr of 1.75 mg/dL (H)).    Allergies  Allergen Reactions   Albuterol Other (See Comments)    Causes afib    Gramineae Pollens Other (See Comments)    Sneeze and watery eyes Sneeze and watery eyes   Other Other (See Comments)    Other reaction(s): Other (See Comments) Unable to breath when around cats Other reaction(s): Other (See Comments) Unable to breath when around cats    Antimicrobials this admission: 2/23 Cefepime >>   Microbiology results: 2/23 BCx: Pending 2/23 UCx: Pending   Thank you for allowing pharmacy to be a part of this patients care.  Renda Rolls, PharmD, [facility] 10/16/2021 4:17 AM

## 2021-10-15 NOTE — ED Notes (Signed)
Daugther arrives at bedside

## 2021-10-15 NOTE — ED Notes (Signed)
Pt placed on male purewick 

## 2021-10-16 ENCOUNTER — Encounter: Payer: Self-pay | Admitting: Internal Medicine

## 2021-10-16 ENCOUNTER — Other Ambulatory Visit: Payer: Self-pay

## 2021-10-16 DIAGNOSIS — R509 Fever, unspecified: Secondary | ICD-10-CM | POA: Diagnosis present

## 2021-10-16 DIAGNOSIS — R531 Weakness: Secondary | ICD-10-CM

## 2021-10-16 DIAGNOSIS — J9611 Chronic respiratory failure with hypoxia: Secondary | ICD-10-CM

## 2021-10-16 LAB — PROCALCITONIN: Procalcitonin: 0.17 ng/mL

## 2021-10-16 LAB — COMPREHENSIVE METABOLIC PANEL
ALT: 8 U/L (ref 0–44)
AST: 12 U/L — ABNORMAL LOW (ref 15–41)
Albumin: 3.2 g/dL — ABNORMAL LOW (ref 3.5–5.0)
Alkaline Phosphatase: 52 U/L (ref 38–126)
Anion gap: 10 (ref 5–15)
BUN: 28 mg/dL — ABNORMAL HIGH (ref 8–23)
CO2: 30 mmol/L (ref 22–32)
Calcium: 8.9 mg/dL (ref 8.9–10.3)
Chloride: 99 mmol/L (ref 98–111)
Creatinine, Ser: 1.76 mg/dL — ABNORMAL HIGH (ref 0.61–1.24)
GFR, Estimated: 41 mL/min — ABNORMAL LOW (ref >60)
Glucose, Bld: 115 mg/dL — ABNORMAL HIGH (ref 70–99)
Potassium: 4.5 mmol/L (ref 3.5–5.1)
Sodium: 139 mmol/L (ref 135–145)
Total Bilirubin: 0.9 mg/dL (ref 0.3–1.2)
Total Protein: 6.6 g/dL (ref 6.5–8.1)

## 2021-10-16 LAB — CBC WITH DIFFERENTIAL/PLATELET
Abs Immature Granulocytes: 0.17 10*3/uL — ABNORMAL HIGH (ref 0.00–0.07)
Basophils Absolute: 0.1 10*3/uL (ref 0.0–0.1)
Basophils Relative: 0 %
Eosinophils Absolute: 0.8 10*3/uL — ABNORMAL HIGH (ref 0.0–0.5)
Eosinophils Relative: 5 %
HCT: 39.2 % (ref 39.0–52.0)
Hemoglobin: 11.9 g/dL — ABNORMAL LOW (ref 13.0–17.0)
Immature Granulocytes: 1 %
Lymphocytes Relative: 3 %
Lymphs Abs: 0.5 10*3/uL — ABNORMAL LOW (ref 0.7–4.0)
MCH: 25.3 pg — ABNORMAL LOW (ref 26.0–34.0)
MCHC: 30.4 g/dL (ref 30.0–36.0)
MCV: 83.2 fL (ref 80.0–100.0)
Monocytes Absolute: 0.9 10*3/uL (ref 0.1–1.0)
Monocytes Relative: 5 %
Neutro Abs: 15.5 10*3/uL — ABNORMAL HIGH (ref 1.7–7.7)
Neutrophils Relative %: 86 %
Platelets: 252 10*3/uL (ref 150–400)
RBC: 4.71 MIL/uL (ref 4.22–5.81)
RDW: 15.7 % — ABNORMAL HIGH (ref 11.5–15.5)
WBC: 18.2 10*3/uL — ABNORMAL HIGH (ref 4.0–10.5)
nRBC: 0 % (ref 0.0–0.2)

## 2021-10-16 LAB — MAGNESIUM: Magnesium: 1.7 mg/dL (ref 1.7–2.4)

## 2021-10-16 LAB — LACTIC ACID, PLASMA: Lactic Acid, Venous: 1.6 mmol/L (ref 0.5–1.9)

## 2021-10-16 LAB — TSH: TSH: 1.888 u[IU]/mL (ref 0.350–4.500)

## 2021-10-16 MED ORDER — BUPROPION HCL ER (SR) 150 MG PO TB12
150.0000 mg | ORAL_TABLET | Freq: Two times a day (BID) | ORAL | Status: DC
Start: 2021-10-16 — End: 2021-10-20
  Administered 2021-10-16 – 2021-10-20 (×9): 150 mg via ORAL
  Filled 2021-10-16 (×12): qty 1

## 2021-10-16 MED ORDER — MIDODRINE HCL 5 MG PO TABS
10.0000 mg | ORAL_TABLET | Freq: Three times a day (TID) | ORAL | Status: DC
  Administered 2021-10-16 – 2021-10-20 (×12): 10 mg via ORAL
  Filled 2021-10-16 (×13): qty 2

## 2021-10-16 MED ORDER — ONDANSETRON HCL 4 MG/2ML IJ SOLN
4.0000 mg | Freq: Four times a day (QID) | INTRAMUSCULAR | Status: DC | PRN
  Administered 2021-10-16 – 2021-10-17 (×2): 4 mg via INTRAVENOUS
  Filled 2021-10-16 (×2): qty 2

## 2021-10-16 MED ORDER — LORATADINE 10 MG PO TABS
10.0000 mg | ORAL_TABLET | Freq: Every day | ORAL | Status: DC
  Administered 2021-10-16 – 2021-10-20 (×5): 10 mg via ORAL
  Filled 2021-10-16 (×5): qty 1

## 2021-10-16 MED ORDER — LACTATED RINGERS IV BOLUS
1000.0000 mL | Freq: Once | INTRAVENOUS | Status: AC
  Administered 2021-10-16: 1000 mL via INTRAVENOUS

## 2021-10-16 MED ORDER — LEVALBUTEROL HCL 1.25 MG/0.5ML IN NEBU
1.2500 mg | INHALATION_SOLUTION | RESPIRATORY_TRACT | Status: DC | PRN
Start: 2021-10-16 — End: 2021-10-20

## 2021-10-16 MED ORDER — MIDODRINE HCL 5 MG PO TABS: 10.0000 mg | ORAL_TABLET | Freq: Three times a day (TID) | ORAL | Status: DC

## 2021-10-16 MED ORDER — ASPIRIN EC 81 MG PO TBEC
81.0000 mg | DELAYED_RELEASE_TABLET | Freq: Every day | ORAL | Status: DC
  Administered 2021-10-16 – 2021-10-19 (×4): 81 mg via ORAL
  Filled 2021-10-16 (×5): qty 1

## 2021-10-16 MED ORDER — ATORVASTATIN CALCIUM 20 MG PO TABS
20.0000 mg | ORAL_TABLET | Freq: Every day | ORAL | Status: DC
  Administered 2021-10-16 – 2021-10-19 (×4): 20 mg via ORAL
  Filled 2021-10-16 (×4): qty 1

## 2021-10-16 MED ORDER — LEVOTHYROXINE SODIUM 88 MCG PO TABS
88.0000 ug | ORAL_TABLET | Freq: Every day | ORAL | Status: DC
  Administered 2021-10-16 – 2021-10-20 (×5): 88 ug via ORAL
  Filled 2021-10-16 (×6): qty 1

## 2021-10-16 NOTE — ED Notes (Addendum)
Pt appears hypotensive. Pt asymptomatic at this time.  Daughter at bedside states that pt has hx of hypotensive and normally takes midorine 10mg  3 times a day. Dr. Velia Meyer notified of situation.

## 2021-10-16 NOTE — Assessment & Plan Note (Signed)
-   Continue home Wellbutrin 

## 2021-10-16 NOTE — Assessment & Plan Note (Signed)
-  PT/OT evaluation 

## 2021-10-16 NOTE — Assessment & Plan Note (Signed)
Estimated body mass index is 42.04 kg/m as calculated from the following:   Height as of this encounter: 6' (1.829 m).   Weight as of this encounter: 140.6 kg.  This will complicate overall prognosis.

## 2021-10-16 NOTE — Assessment & Plan Note (Signed)
Prior echocardiogram in August 2022 with normal EF and indeterminate diastolic parameters.  BNP within normal limit.  He was on home torsemide which was being held initially as he received some IV fluid for concern of sepsis.  Currently appears euvolemic. -Strict intake and output -We will resume torsemide from tomorrow.

## 2021-10-16 NOTE — Assessment & Plan Note (Signed)
-   Continue home statin °

## 2021-10-16 NOTE — Evaluation (Signed)
Occupational Therapy Evaluation Patient Details Name: Christopher [name] MRN: [id number] DOB: 10-22-1950 Today's Date: 10/16/2021   History of Present Illness Pt is a 71 y/o M with PMH: dCHF, depression, anxiety, COPD, HLD, morbid obesity, hypothyroidism, and sleep apnea (on cpap at night) who presented to ED d/t sliding out of his chair and being too weak to get back up. Pt adm for sepsis workup.   Clinical Impression   Pt seen for OT evaluation this date in setting of acute hospitalization d/t sepsis. Pt reports using RW for transfers at baseline and w/c for fxl mobility d/t chronic knee pain. In addition, he reports he is able to perform basic self care at baseline, but requires assist from either his daughter or her boyfriend who both also live in the house. Pt presents this date with decreased strength, decreased activity tolerance, SOB, and generalized deconditioning that are impacting his ability to safely and efficiently perform ADLs/ADL mobility. Pt currently requires: SETUP to MIN A for seated UB ADLs, MAX A for LB ADLs, MAX A for bed mobility, Attempted CTS with RW from stretcher height with limited ability to clear bottom, c/o feeling as if he is sitting awkwardly on EOB, but even with adjusting/squaring hips, still unsuccessful. MAX A +2 to propel towards HOB in [place]. Pt back to bed with all needs met and in reach. Will continue to follow, recommend STR.      Recommendations for follow up therapy are one component of a multi-disciplinary discharge planning process, led by the attending physician.  Recommendations may be updated based on patient status, additional functional criteria and insurance authorization.   Follow Up Recommendations  Skilled nursing-short term rehab (<3 hours/day)    Assistance Recommended at Discharge Frequent or constant Supervision/Assistance  Patient can return home with the following A lot of help with walking and/or transfers;A lot of help with  bathing/dressing/bathroom;Assistance with cooking/housework;Assist for transportation;Help with stairs or ramp for entrance    Functional Status Assessment  Patient has had a recent decline in their functional status and demonstrates the ability to make significant improvements in function in a reasonable and predictable amount of time.  Equipment Recommendations  None recommended by OT    Recommendations for Other Services       Precautions / Restrictions Precautions Precautions: Fall Restrictions Weight Bearing Restrictions: No      Mobility Bed Mobility Overal bed mobility: Needs Assistance Bed Mobility: Rolling, Supine to Sit, Sit to Supine Rolling: Max assist   Supine to sit: Max assist, HOB elevated Sit to supine: Max assist        Transfers                   General transfer comment: unable to clear bottom      Balance Overall balance assessment: Needs assistance   Sitting balance-Leahy Scale: Fair Sitting balance - Comments: requires UE support, initially MIN A, but able to progress to static sitting with SUPV       Standing balance comment: unable, anticipate 2p assist.                           ADL either performed or assessed with clinical judgement   ADL                                         General ADL Comments:  SETUP to MIN A for seated UB ADLs, MAX A for LB ADLs, MAX A for bed mobility, Attempted CTS with RW from stretcher height with limited ability to clear bottom, c/o feeling as if he is sitting awkwardly on EOB, but even with adjusting/squaring hips, still unsuccessful. MAX A +2 to propel towards HOB in [place].     Vision Baseline Vision/History: 1 Wears glasses Patient Visual Report: No change from baseline       Perception     Praxis      Pertinent Vitals/Pain Pain Assessment Pain Assessment: Faces Faces Pain Scale: Hurts even more Pain Location: knees Pain Descriptors / Indicators:  Discomfort Pain Intervention(s): Monitored during session     Hand Dominance     Extremity/Trunk Assessment Upper Extremity Assessment Upper Extremity Assessment: Generalized weakness   Lower Extremity Assessment Lower Extremity Assessment: Generalized weakness       Communication Communication Communication: HOH   Cognition Arousal/Alertness: Awake/alert Behavior During Therapy: WFL for tasks assessed/performed Overall Cognitive Status: Within Functional Limits for tasks assessed                                       General Comments       Exercises Other Exercises Other Exercises: OT ed re: role, importance of OOB activity, strengthening, d/c planning   Shoulder Instructions      Home Living Family/patient expects to be discharged to:: Private residence Living Arrangements: Children (daughter and her boyfriend) Available Help at Discharge: Available 24 hours/day Type of Home: House Home Access: Stairs to enter Technical brewer of Steps: 2 Entrance Stairs-Rails: Right;Left Home Layout: Able to live on main level with bedroom/bathroom         Bathroom Toilet: Handicapped height     Home Equipment: Conservation officer, nature (2 wheels);Rollator (4 wheels);Wheelchair - manual;BSC/3in1;Shower seat - built in          Prior Functioning/Environment Prior Level of Function : Needs assist;Independent/Modified Independent;History of Falls (last six months)       Physical Assist : Mobility (physical);ADLs (physical)   ADLs (physical): IADLs Mobility Comments: Pt states he uses RW only to pivot to various surfaces such as chair, wheelchair, or commode. Uses w/c for all functional mobility d/t chronic knee pain. ADLs Comments: Pt reports being able to dress and bathe himself, uses AE for LB occasionally at baseline. States that his daughter or her boyfriend assist with transportation as well as most HH IADLs, boyfriend does most the cooking.        OT  Problem List: Decreased strength;Decreased range of motion;Decreased activity tolerance;Impaired balance (sitting and/or standing);Cardiopulmonary status limiting activity;Obesity      OT Treatment/Interventions: Self-care/ADL training;Therapeutic exercise;Therapeutic activities;Energy conservation;Patient/family education;DME and/or AE instruction    OT Goals(Current goals can be found in the care plan section) Acute Rehab OT Goals Patient Stated Goal: to go home OT Goal Formulation: With patient Time For Goal Achievement: 10/30/21 Potential to Achieve Goals: Fair ADL Goals Pt Will Transfer to Toilet: with min assist;stand pivot transfer;bedside commode Pt Will Perform Toileting - Clothing Manipulation and hygiene: with min assist;sitting/lateral leans Pt Will Perform Tub/Shower Transfer: with min assist;shower seat;grab bars  OT Frequency: Min 2X/week    Co-evaluation              AM-PAC OT "6 Clicks" Daily Activity     Outcome Measure Help from another person eating meals?: None Help from another  person taking care of personal grooming?: A Little Help from another person toileting, which includes using toliet, bedpan, or urinal?: A Lot Help from another person bathing (including washing, rinsing, drying)?: A Lot Help from another person to put on and taking off regular upper body clothing?: A Little Help from another person to put on and taking off regular lower body clothing?: A Lot 6 Click Score: 16   End of Session Equipment Utilized During Treatment: Rolling walker (2 wheels) Nurse Communication: Mobility status  Activity Tolerance:  (limited by pain and SOB) Patient left: in bed;with call bell/phone within reach  OT Visit Diagnosis: Unsteadiness on feet (R26.81);Muscle weakness (generalized) (M62.81);History of falling (Z91.81)                Time: 1026-1110 OT Time Calculation (min): 44 min Charges:  OT General Charges $OT Visit: 1 Visit OT Evaluation $OT Eval  Moderate Complexity: 1 Mod OT Treatments $Self Care/Home Management : 8-22 mins $Therapeutic Activity: 8-22 mins  Gerrianne Scale, MS, OTR/L ascom [phone] 10/16/21, 3:48 PM

## 2021-10-16 NOTE — Progress Notes (Signed)
Cross Cover CPAP ordered for patient as he uses at home for his OSA

## 2021-10-16 NOTE — Assessment & Plan Note (Signed)
Renal function seems stable around baseline. -Monitor renal function -Avoid nephrotoxins

## 2021-10-16 NOTE — Hospital Course (Addendum)
Taken from H&P.  Herrick Hartog is a 71 y.o. male with medical history significant for chronic diastolic heart failure, chronic hypoxic hypercapnic respiratory failure on 2 L continuous nasal cannula, hyperlipidemia, acquired hypothyroidism, stage IIIb chronic kidney disease with baseline creatinine 1.5-1.9, who is admitted to [facility] on 10/15/2021 with severe sepsis of unclear source after presenting from home to [facility] ED complaining of generalized weakness.  No focal weakness.  In ED he was found to be febrile at 102.1, mildly tachycardic and tachypneic, and leukocytosis meeting sepsis criteria.  No obvious source found yet.  Has an history of vomiting yesterday, might be GI source. Initial blood cultures remain negative in 12 hours, UA with trace leukocytes and cultures pending. In ED he receives fluids per sepsis protocol and IV acetaminophen for fever and was started on broad-spectrum antibiotics.  2/25: Appetite remains poor, no nausea, vomiting or diarrhea.  Continues to have generalized weakness. Blood cultures remain negative.  Urine cultures with 10,000 colonies of Enterococcus faecalis. Ordered repeat chest x-ray.  Remained febrile  2/26: Patient remained afebrile over the past 24 hours.  Clinically stable.  He wants to go home.  Final urine culture results still pending.  PT/OT is recommending SNF.  2/27: Patient remained stable.  He wants to go home and asking for reevaluation by PT.  Daughter wants him to go to rehab.

## 2021-10-16 NOTE — Progress Notes (Signed)
Progress Note   Patient: Christopher [name] ESP:[id number] DOB: 10/06/1950 DOA: 10/15/2021     1 DOS: the patient was seen and examined on 10/16/2021   Brief hospital course: Taken from H&P.  Christopher [name] is a 71 y.o. male with medical history significant for chronic diastolic heart failure, chronic hypoxic hypercapnic respiratory failure on 2 L continuous nasal cannula, hyperlipidemia, acquired hypothyroidism, stage IIIb chronic kidney disease with baseline creatinine 1.5-1.9, who is admitted to [facility] on 10/15/2021 with severe sepsis of unclear source after presenting from home to [facility] ED complaining of generalized weakness.  No focal weakness.  In ED he was found to be febrile at 102.1, mildly tachycardic and tachypneic, and leukocytosis meeting sepsis criteria.  No obvious source found yet.  Has an history of vomiting yesterday, might be GI source. Initial blood cultures remain negative in 12 hours, UA with trace leukocytes and cultures pending. In ED he receives fluids per sepsis protocol and IV acetaminophen for fever and was started on broad-spectrum antibiotics.   Assessment and Plan: * Severe sepsis ([place])- (present on admission) Patient met sepsis criteria with fever, leukocytosis, tachycardia and tachypnea.  COVID-19 and flu PCR negative.  Not much significant upper respiratory symptoms.  Did had some vomiting yesterday, no diarrhea.  Continue to feel weak.  Denies any urinary symptoms. Initial blood cultures negative in 12 hours, urinary cultures pending.  Procalcitonin at 0.17. -Continue with cefepime -Follow-up urinary cultures -Symptomatic management  Chronic diastolic CHF (congestive heart failure) ([place])- (present on admission) Prior echocardiogram in August 2022 with normal EF and indeterminate diastolic parameters.  BNP within normal limit.  He was on home torsemide which was being held initially as he received some IV fluid for concern of sepsis.  Currently appears  euvolemic. -Strict intake and output -We will resume torsemide from tomorrow.  Stage 3b chronic kidney disease (CKD) ([place])- (present on admission) Renal function seems stable around baseline. -Monitor renal function -Avoid nephrotoxins  Chronic respiratory failure with hypoxia (HCC) Currently saturating well on his baseline oxygen requirement of 2 L. -Continue with supplemental oxygen to keep the saturation above 90% -As needed bronchodilators  Hypothyroidism- (present on admission) TSH within normal limit. -Continue home Synthroid  Generalized weakness - PT/OT evaluation  Hyperlipidemia- (present on admission) - Continue home statin  Depression- (present on admission) - Continue home Wellbutrin  Morbid obesity with BMI of 45.0-49.9, adult (HCC) Estimated body mass index is 42.04 kg/m as calculated from the following:   Height as of this encounter: 6' (1.829 m).   Weight as of this encounter: 140.6 kg.  This will complicate overall prognosis.  Subjective: Patient was seen and examined today.  Had 1 episode of vomiting yesterday, no diarrhea.  No nausea or vomiting today, able to tolerate diet.  Continue to have some generalized weakness.  Denies any urinary symptoms.  Physical Exam: Vitals:   10/16/21 0745 10/16/21 0800 10/16/21 1252 10/16/21 1400  BP: (!) 117/55 (!) 122/96 122/70 (!) 143/63  Pulse: 84 85 87 83  Resp: (!) 22 (!) 23 (!) 21 (!) 24  Temp:      TempSrc:      SpO2: 95% 96% 100% 98%  Weight:      Height:       General.  Well-developed, obese elderly man, in no acute distress. Pulmonary.  Lungs clear bilaterally, normal respiratory effort. CV.  Regular rate and rhythm, no JVD, rub or murmur. Abdomen.  Soft, nontender, nondistended, BS positive. CNS.  Alert and oriented .  No focal neurologic deficit. Extremities.  No edema, no cyanosis, pulses intact and symmetrical.  Signs of chronic venous congestion. Psychiatry.  Judgment and insight appears  normal.  Data Reviewed: I personally reviewed prior notes, labs and images  Family Communication:   Disposition: Status is: Inpatient Remains inpatient appropriate because: Severity of illness   Planned Discharge Destination: To be determined   Time spent: 50 minutes  This record has been created using Systems analyst. Errors have been sought and corrected,but may not always be located. Such creation errors do not reflect on the standard of care.  Author: Lorella Nimrod, MD 10/16/2021 3:26 PM  For on call review [email or web address].

## 2021-10-16 NOTE — Assessment & Plan Note (Signed)
Currently saturating well on his baseline oxygen requirement of 2 L. -Continue with supplemental oxygen to keep the saturation above 90% -As needed bronchodilators

## 2021-10-16 NOTE — Assessment & Plan Note (Signed)
Patient met sepsis criteria with fever, leukocytosis, tachycardia and tachypnea.  COVID-19 and flu PCR negative.  Not much significant upper respiratory symptoms.  Did had some vomiting yesterday, no diarrhea.  Continue to feel weak.  Denies any urinary symptoms. Initial blood cultures negative in 12 hours, urinary cultures pending.  Procalcitonin at 0.17. -Continue with cefepime -Follow-up urinary cultures -Symptomatic management

## 2021-10-16 NOTE — H&P (Signed)
History and Physical    PLEASE NOTE THAT DRAGON DICTATION SOFTWARE WAS USED IN THE CONSTRUCTION OF THIS NOTE.   Christopher [name] SUP:[id number] DOB: 1951-03-11 DOA: 10/15/2021  PCP: Rusty Aus, MD  Patient coming from: home   I have personally briefly reviewed patient's old medical records in [place]  Chief Complaint: Generalized weakness  HPI: Christopher [name] is a 71 y.o. male with medical history significant for chronic diastolic heart failure, chronic hypoxic hypercapnic respiratory failure on 2 L continuous nasal cannula, hyperlipidemia, acquired hypothyroidism, stage IIIb chronic kidney disease with baseline creatinine 1.5-1.9, who is admitted to [facility] on 10/15/2021 with severe sepsis of unclear source after presenting from home to [facility] ED complaining of generalized weakness.   The patient reports 1 to 2 days of generalized weakness in the absence of any acute focal weakness, acute focal numbness, paresthesias, facial droop, slurred speech, expressive aphasia, acute change in vision, dysphagia, vertigo.  Over that timeframe, he reports subjective fever, but is not measured his temperature at home to evaluate for the presence of objective fever.  Denies any associated chills, fully rigors, or generalized myalgias. Denies any recent headache, neck stiffness, rhinitis, rhinorrhea, sore throat, sob, wheezing, cough, nausea, vomiting, abdominal pain, diarrhea, or rash. Denies any recent chest pain, diaphoresis, or palpitations.  In the setting of the patient's generalized weakness, he reports that he gradually slid out of his chair while sitting at home, and subsequently laid on the floor, feeling too weak and a general sense in order to extricate himself from the floor.  He reports that this was not associate with any fall, brother emphasizes that he was able to lower himself to the floor.  Did not hit his head as a component of the sequence.  He was also able to contact the fire  department, who, in the setting of generalized weakness, brought the patient to [facility] ED for further evaluation and management thereof.  Medical history notable for documented history of chronic diastolic heart failure, with most recent echocardiogram noted to have occurred on 04/21/2021, which demonstrated LVEF 60 to 65%, no focal wall motion abnormalities, indeterminate diastolic parameters, right ventricular systolic function normal, and mild mitral vegetation.  He is on torsemide 20 mg p.o. daily as an outpatient.  He also has history of stage IIIb chronic kidney disease, with most recent serum creatinine noted to be 1.9 when checked on 06/29/2021, which was preceded by value of 1.49 on 05/25/2021.      ED Course:  Vital signs in the ED were notable for the following: Temperature max 1-2.1; heart rate 95-1 07; blood pressure 110/62 - 129/70; respiratory rate 20-28, oxygen saturation 99 to 100% on 4 L nasal cannula.  Labs were notable for the following: CMP was notable for the following creatinine 1.75, liver enzymes found to be within normal limits.  BNP 96.7.  CBC notable for white blood cell count 22,000 with 92% neutrophils.  Lactic acid 2.2.  Urinalysis ordered, with result currently pending.  COVID-19/influenza PCR negative.  Blood cultures x2 were collected prior to initiation of IV antibiotics.  Imaging and additional notable ED work-up: EKG shows sinus tachycardia, with normal intervals and no evidence of T wave or ST changes.  Chest x-ray showed mild cardiomegaly with mild central vascular congestion in the absence of interstitial or pulmonary edema, also showing no evidence of infiltrate, pleural effusion, or pneumothorax.  While in the ED, the following were administered: Acetaminophen 1 g IV x1, cefepime, lactated Ringer's x5.  Cc bolus followed by initiation of continuous LR at 150 cc/h.  Subsequently, the patient was admitted for further evaluation management of severe sepsis of  unclear source after presenting with subjective fever, tachycardia, leukocytosis.      Review of Systems: As per HPI otherwise 10 point review of systems negative.   Past Medical History:  Diagnosis Date   Anxiety    CHF (congestive heart failure) (HCC)    COPD (chronic obstructive pulmonary disease) ([employer])    Depression    Dysrhythmia    Hyperlipidemia    Hypertension    Hypothyroidism    Sleep apnea     Past Surgical History:  Procedure Laterality Date   FRACTURE SURGERY     ORIF ANKLE FRACTURE Left 12/17/2020   Procedure: OPEN REDUCTION INTERNAL FIXATION (ORIF) ANKLE FRACTURE;  Surgeon: Samara Deist, DPM;  Location: [facility] ORS;  Service: Podiatry;  Laterality: Left;    Social History:  reports that he has never smoked. He has never used smokeless tobacco. He reports that he does not currently use alcohol. He reports that he does not use drugs.   Allergies  Allergen Reactions   Albuterol Other (See Comments)    Causes afib    Gramineae Pollens Other (See Comments)    Sneeze and watery eyes Sneeze and watery eyes   Other Other (See Comments)    Other reaction(s): Other (See Comments) Unable to breath when around cats Other reaction(s): Other (See Comments) Unable to breath when around cats    Family History  Problem Relation Age of Onset   Hypertension Mother     Family history reviewed and not pertinent    Prior to Admission medications   Medication Sig Start Date End Date Taking? Authorizing Provider  acetaminophen (TYLENOL) 325 MG tablet Take 2 tablets (650 mg total) by mouth every 6 (six) hours as needed for mild pain, fever, headache or moderate pain. 05/23/21   Loletha Grayer, MD  aspirin 81 MG EC tablet Take 81 mg by mouth at bedtime.    [provider]  atorvastatin (LIPITOR) 20 MG tablet Take 20 mg by mouth at bedtime. 10/06/20   [provider]  buPROPion (WELLBUTRIN SR) 150 MG 12 hr tablet Take 150 mg by mouth 2 (two) times daily.  After 1st meal of day and after dinner 12/02/20   [provider]  Cholecalciferol 25 MCG (1000 UT) capsule Take 1,000 Units by mouth at bedtime.    [provider]  cyanocobalamin 1000 MCG tablet Take 1,000 mcg by mouth 2 (two) times a week. At bedtime. Sundays and Wednesdays    [provider]  hydrocortisone cream 1 % Apply topically as needed for itching. 05/25/21   Loletha Grayer, MD  levalbuterol [facility] HFA) 45 MCG/ACT inhaler Inhale 2 puffs into the lungs every 4 (four) hours as needed for wheezing.    [provider]  levalbuterol Penne Lash) 0.63 MG/3ML nebulizer solution Take 3 mLs (0.63 mg total) by nebulization every 6 (six) hours as needed for wheezing or shortness of breath. 03/24/21   Fritzi Mandes, MD  levothyroxine (SYNTHROID) 88 MCG tablet Take 88 mcg by mouth daily. 30 to 60 minutes before breakfast on an empty stomach and with a glass of water 12/02/20   [provider]  loratadine (CLARITIN) 10 MG tablet Take 10 mg by mouth daily.    [provider]  midodrine (PROAMATINE) 10 MG tablet Take 1 tablet (10 mg total) by mouth 3 (three) times daily with meals.  05/23/21   Loletha Grayer, MD  Multiple Vitamins-Minerals (PRESERVISION AREDS 2) CAPS Take 1 capsule by mouth 2 (two) times daily. With food. After 1st meal of day and after dinner    [provider]  OXYGEN Inhale 2 L into the lungs continuous.    [provider]  potassium chloride SA (KLOR-CON) 20 MEQ tablet Take 1 tablet (20 mEq total) by mouth daily. 05/24/21   Loletha Grayer, MD  torsemide (DEMADEX) 20 MG tablet Take 1 tablet (20 mg total) by mouth daily. 05/24/21   Loletha Grayer, MD  trolamine salicylate (ASPERCREME) 10 % cream Apply 1 application topically as needed for muscle pain. Apply to knees and feet    [provider]  Vitamins A & D (VITAMIN A & D) ointment Apply 1 application topically as needed. To groin area.    [provider]     Objective    Physical Exam: Vitals:   10/15/21 2130 10/15/21 2200 10/15/21 2230 10/16/21 0003  BP: 110/62 119/80 121/72   Pulse: 96 94 (!) 110   Resp: (!) 26 (!) 28 (!) 26   Temp:      TempSrc:      SpO2: 98% 99% 97% 98%  Weight:      Height:        General: appears to be stated age; alert, oriented Skin: warm, dry, no rash Head:  AT/[place] Mouth:  Oral mucosa membranes appear dry, normal dentition Neck: supple; trachea midline Heart:  RRR; did not appreciate any M/R/G Lungs: CTAB, did not appreciate any wheezes, rales, or rhonchi Abdomen: + BS; soft, ND, NT Vascular: 2+ pedal pulses b/l; 2+ radial pulses b/l Extremities: no peripheral edema, no muscle wasting Neuro: strength and sensation intact in upper and lower extremities b/l    Labs on Admission: I have personally reviewed following labs and imaging studies  CBC: Recent Labs  Lab 10/15/21 2050  WBC 22.1*  NEUTROABS 20.4*  HGB 13.0  HCT 42.4  MCV 82.5  PLT 680   Basic Metabolic Panel: Recent Labs  Lab 10/15/21 2050  NA 137  K 4.5  CL 97*  CO2 27  GLUCOSE 132*  BUN 31*  CREATININE 1.75*  CALCIUM 9.4  MG 1.9   GFR: Estimated Creatinine Clearance: 56.3 mL/min (A) (by C-G formula based on SCr of 1.75 mg/dL (H)). Liver Function Tests: Recent Labs  Lab 10/15/21 2050  AST 18  ALT 8  ALKPHOS 60  BILITOT 1.1  PROT 7.7  ALBUMIN 3.9   No results for input(s): LIPASE, AMYLASE in the last 168 hours. No results for input(s): AMMONIA in the last 168 hours. Coagulation Profile: Recent Labs  Lab 10/15/21 2050  INR 1.1   Cardiac Enzymes: No results for input(s): CKTOTAL, CKMB, CKMBINDEX, TROPONINI in the last 168 hours. BNP (last 3 results) No results for input(s): PROBNP in the last 8760 hours. HbA1C: No results for input(s): HGBA1C in the last 72 hours. CBG: No results for input(s): GLUCAP in the last 168 hours. Lipid Profile: No results for input(s): CHOL, HDL, LDLCALC, TRIG,  CHOLHDL, LDLDIRECT in the last 72 hours. Thyroid Function Tests: No results for input(s): TSH, T4TOTAL, FREET4, T3FREE, THYROIDAB in the last 72 hours. Anemia Panel: No results for input(s): VITAMINB12, FOLATE, FERRITIN, TIBC, IRON, RETICCTPCT in the last 72 hours. Urine analysis:    Component Value Date/Time   COLORURINE YELLOW (A) 10/15/2021 2240   APPEARANCEUR CLEAR (A) 10/15/2021 2240   LABSPEC 1.013 10/15/2021 2240  PHURINE 5.0 10/15/2021 2240   GLUCOSEU NEGATIVE 10/15/2021 2240   HGBUR NEGATIVE 10/15/2021 [place] 10/15/2021 2240   KETONESUR NEGATIVE 10/15/2021 2240   PROTEINUR NEGATIVE 10/15/2021 2240   NITRITE NEGATIVE 10/15/2021 2240   LEUKOCYTESUR TRACE (A) 10/15/2021 2240    Radiological Exams on Admission: DG Chest Port 1 View  Result Date: 10/15/2021 CLINICAL DATA:  Questionable sepsis. EXAM: PORTABLE CHEST 1 VIEW COMPARISON:  Chest radiograph dated 05/22/2021. FINDINGS: No focal consolidation, pleural effusion, pneumothorax. Mild cardiomegaly with mild vascular congestion. No acute osseous pathology. IMPRESSION: Mild cardiomegaly with mild central vascular congestion. No focal consolidation. Electronically Signed   By: Anner Crete M.D.   On: 10/15/2021 21:31     EKG: Independently reviewed, with result as described above.    Assessment/Plan    Principal Problem:   Severe sepsis (HCC) Active Problems:   Hyperlipidemia   Hypothyroidism   Chronic diastolic CHF (congestive heart failure) (HCC)   Stage 3b chronic kidney disease (CKD) (HCC)   Generalized weakness     #) Severe sepsis: SIRS criteria met via presenting objective fever, Dmitriy max in the ED noted to be 1-2.1, as well as the presence of tachycardia, tachypnea, and leukocytosis, presenting with blood cell count of 22,000 associated with neutrophilic predominance.  While underlying infection is suspected, no overt source of the suspected infection has been identified at this  time, including COVID-19/influenza PCR negative, while chest x-ray shows no evidence of infiltrate to suggest pneumonia.  Urinalysis ordered, with result currently pending.  Of note, given the associated presence of suspected end organ damage in the form of concominant presenting lactic acidosis, with initial lactate 2.2, criteria are met for pt's sepsis to be considered severe in nature. However, in the absence of lactic acid level that is greater than or equal to 4.0, and in the absence of any associated hypotension refractory to IVF's, there are no indications for administration of a 30 mL/kg IVF bolus at this time.   Additional ED work-up/management notable for: Blood cultures x2 were collected prior to him.  Initiation of cefepime. Will follow closely for result of urinalysis.    Plan: CBC w/ diff in AM.  Follow for results of blood cx's x 2. Abx: Continue cefepime.  Follow-up result urinalysis.  Add on procalcitonin.  Repeat CMP in the morning.  Will reduce rate of IV fluids from to 75 cc/hr as LA has normalized.  Monitor on telemetry.  As needed acetaminophen.       #) Generalized weakness: 1 to 2 days of generalized weakness, in the absence of any acute focal neurologic deficits to suggest stroke.  Suspect the patient's generalized weakness is on the basis of physiologic stressors stemming from presenting severe sepsis of unclear infectious source, as further detailed above.  He is also noted to have a history of B12 deficiency.  Therefore, we will check MMA level.  Plan: Fall precautions.  PT/OT consults been ordered.  Check MMA/TSH.  Follow-up result urinalysis.  Further evaluation and management of presenting severe sepsis, as further detailed above.  Repeat CMP and CBC in the morning.        #) Depression: Documented history of such: On Wellbutrin as an outpatient.  Plan: Continue home Wellbutrin.        #) Chronic diastolic heart failure: documented history of such, with  most recent echocardiogram performed in August 2022, with results notable for LVEF 60 to 65% as well as indeterminate diastolic parameters, with additional details  as conveyed above. No clinical evidence to suggest acutely decompensated heart failure at this time.  There is some evidence of pulmonary vascular congestion on chest x-ray, but in the absence of any evidence of interstitial or pulmonary edema, along with nonelevated presenting BNP home diuretic regimen reportedly consists of the following: Torsemide.    Plan: monitor strict I's & O's and daily weights. Repeat BMP in AM. Check serum mag level.  In the setting of presenting severe sepsis, will hold home torsemide for now.  Reduce rate of IV fluids, as quantified above.         #) Hyperlipidemia: documented h/o such. On atorvastatin as outpatient.    Plan: continue home statin.           #) acquired hypothyroidism: documented h/o such, on Synthroid as outpatient.  In the context of presenting generalized weakness as well as objective fever, will also check TSH.  Plan: cont home Synthroid.  Check TSH.         #) Allergic Rhinitis: documented h/o such, on scheduled loratadine as an outpatient, in the absence of any intranasal corticosteroid.  Plan: cont home loratadine.         #) Stage IIIb chronic kidney disease: Associated with baseline creatinine 1.5-1.9, with presenting serum creatinine found to be consistent with this range.  Plan: Monitor strict I's and O's and daily weights.  Tempt avoid nephrotoxic agents.  Repeat CMP in the morning.        DVT prophylaxis: SCD's   Code Status: Full code Family Communication: none Disposition Plan: Per Rounding Team Consults called: none;  Admission status: Inpatient   PLEASE NOTE THAT DRAGON DICTATION SOFTWARE WAS USED IN THE CONSTRUCTION OF THIS NOTE.   [place] DO [facility] [place]   10/16/2021, 12:53 AM

## 2021-10-16 NOTE — Assessment & Plan Note (Signed)
TSH within normal limit. °-Continue home Synthroid °

## 2021-10-16 NOTE — Plan of Care (Signed)

## 2021-10-17 ENCOUNTER — Inpatient Hospital Stay: Payer: Medicare Other

## 2021-10-17 LAB — BASIC METABOLIC PANEL
Anion gap: 10 (ref 5–15)
BUN: 31 mg/dL — ABNORMAL HIGH (ref 8–23)
CO2: 27 mmol/L (ref 22–32)
Calcium: 8.8 mg/dL — ABNORMAL LOW (ref 8.9–10.3)
Chloride: 97 mmol/L — ABNORMAL LOW (ref 98–111)
Creatinine, Ser: 1.56 mg/dL — ABNORMAL HIGH (ref 0.61–1.24)
GFR, Estimated: 47 mL/min — ABNORMAL LOW (ref >60)
Glucose, Bld: 99 mg/dL (ref 70–99)
Potassium: 4.2 mmol/L (ref 3.5–5.1)
Sodium: 134 mmol/L — ABNORMAL LOW (ref 135–145)

## 2021-10-17 LAB — CBC
HCT: 36.9 % — ABNORMAL LOW (ref 39.0–52.0)
Hemoglobin: 11.3 g/dL — ABNORMAL LOW (ref 13.0–17.0)
MCH: 25.2 pg — ABNORMAL LOW (ref 26.0–34.0)
MCHC: 30.6 g/dL (ref 30.0–36.0)
MCV: 82.2 fL (ref 80.0–100.0)
Platelets: 236 10*3/uL (ref 150–400)
RBC: 4.49 MIL/uL (ref 4.22–5.81)
RDW: 15.9 % — ABNORMAL HIGH (ref 11.5–15.5)
WBC: 16.1 10*3/uL — ABNORMAL HIGH (ref 4.0–10.5)
nRBC: 0 % (ref 0.0–0.2)

## 2021-10-17 LAB — PROCALCITONIN: Procalcitonin: 0.26 ng/mL

## 2021-10-17 MED ORDER — TORSEMIDE 20 MG PO TABS
20.0000 mg | ORAL_TABLET | Freq: Every day | ORAL | Status: DC
Start: 2021-10-18 — End: 2021-10-20
  Administered 2021-10-18 – 2021-10-20 (×3): 20 mg via ORAL
  Filled 2021-10-17 (×3): qty 1

## 2021-10-17 MED ORDER — SODIUM CHLORIDE 0.9 % IV SOLN: INTRAVENOUS | Status: AC

## 2021-10-17 NOTE — Assessment & Plan Note (Signed)
Prior echocardiogram in August 2022 with normal EF and indeterminate diastolic parameters.  BNP within normal limit.  He was on home torsemide which was being held initially as he received some IV fluid for concern of sepsis.  Currently appears euvolemic. -Strict intake and output -Will resume torsemide from tomorrow.

## 2021-10-17 NOTE — Progress Notes (Signed)
°   10/17/21 0028  Assess: MEWS Score  Temp (!) 102.7 F (39.3 C)  BP 130/62  Pulse Rate 91  Resp 18  SpO2 91 %  O2 Device Room Air  Assess: MEWS Score  MEWS Temp 2  MEWS Systolic 0  MEWS Pulse 0  MEWS RR 0  MEWS LOC 0  MEWS Score 2  MEWS Score Color Yellow  Assess: if the MEWS score is Yellow or Red  Were vital signs taken at a resting state? Yes  Focused Assessment No change from prior assessment  Does the patient meet 2 or more of the SIRS criteria? Yes  Does the patient have a confirmed or suspected source of infection? Yes  Provider and Rapid Response Notified? No  MEWS guidelines implemented *See Row Information* Yes  Treat  MEWS Interventions Administered prn meds/treatments (Tylenol given)  Pain Scale 0-10  Pain Score 0  Take Vital Signs  Increase Vital Sign Frequency  Yellow: Q 2hr X 2 then Q 4hr X 2, if remains yellow, continue Q 4hrs  Escalate  MEWS: Escalate Yellow: discuss with charge nurse/RN and consider discussing with provider and RRT  Notify: Charge Nurse/RN  Name of Charge Nurse/RN Notified [place]  Date Charge Nurse/RN Notified 10/17/21  Time Charge Nurse/RN Notified 0030  Document  Patient Outcome Other (Comment) (Will continue to monitor)  Progress note created (see row info) Yes  Assess: SIRS CRITERIA  SIRS Temperature  1  SIRS Pulse 1  SIRS Respirations  0  SIRS WBC 0  SIRS Score Sum  2

## 2021-10-17 NOTE — Evaluation (Signed)
Physical Therapy Evaluation Patient Details Name: Christopher [name] MRN: [id number] DOB: Dec 23, 1950 Today's Date: 10/17/2021  History of Present Illness  Pt is a 71 y/o M with PMH: dCHF, depression, anxiety, COPD, HLD, morbid obesity, hypothyroidism, and sleep apnea (on cpap at night) who presented to ED d/t sliding out of his chair and being too weak to get back up. Pt adm for sepsis workup.   Clinical Impression  Physical therapy evaluation completed this date. Patient tolerated session fairly well, and was agreeable to treatment. Patient reported mild 4/10 pain in the R knee at rest with no increase in pain throughout session. Per patient, he lives in a private residence with his daughter and her boyfriend. Patient utilizes a RW at baseline, and requires some assistance from daughter or daughters boyfriend for occasionally self care needs. Patient is very fearful of falling, and required frequent seated rest breaks throughout session due to fatigue. High encouragement given to patient to complete tasks.   Patient is demonstrating a decline in functional mobility, strength, and balance. Bed mobility was completed at supervision with increased time and effort, along with heavy use of bed rails. Patient requires elevated bed surfaces to complete multiple sit to stands, and was unable to stand >2 minutes before reporting fatigue. Patient unable to ambulate long distances for safety, BLE weakness (at least 3/5 bilaterally), increased unsteadiness at the knees. RN called into room for CGA +2 assistance for safety to complete step transfer from EOB to recliner with RW. Poor eccentric control noted when completing standing to sit transfers to EOB and recliner. Patient would continue to benefit from skilled physical therapy in order to optimize patient's return to PLOF. Recommend STR upon discharge from acute hospitalization.      Recommendations for follow up therapy are one component of a multi-disciplinary  discharge planning process, led by the attending physician.  Recommendations may be updated based on patient status, additional functional criteria and insurance authorization.  Follow Up Recommendations Skilled nursing-short term rehab (<3 hours/day)    Assistance Recommended at Discharge Set up Supervision/Assistance  Patient can return home with the following  Two people to help with walking and/or transfers;A lot of help with bathing/dressing/bathroom;Direct supervision/assist for medications management;Direct supervision/assist for financial management;Two people to help with bathing/dressing/bathroom;A lot of help with walking and/or transfers;Assist for transportation    Equipment Recommendations Other (comment) (defer to next level of care)  Recommendations for Other Services       Functional Status Assessment Patient has had a recent decline in their functional status and demonstrates the ability to make significant improvements in function in a reasonable and predictable amount of time.     Precautions / Restrictions Precautions Precautions: Fall Restrictions Weight Bearing Restrictions: No      Mobility  Bed Mobility Overal bed mobility: Needs Assistance Bed Mobility: Supine to Sit, Sit to Supine Rolling: Supervision   Supine to sit: Supervision, HOB elevated Sit to supine: Supervision, HOB elevated   General bed mobility comments: Increased time and effort to complete, signitificant use of bed rails to complete supine to sit transfer    Transfers Overall transfer level: Needs assistance Equipment used: Rolling walker (2 wheels) Transfers: Sit to/from Stand (completed x4 sit to stands from EOB) Sit to Stand: Min assist           General transfer comment: elevated bed level to complete, patient required use of trunk momentum to stand, pushes up from bed and utilizes bed rail to pull up into standing as  well, decreased eccentric control back into sitting from  standing    Ambulation/Gait Ambulation/Gait assistance: Min guard, +2 safety/equipment Gait Distance (Feet): 4 Feet (from EOB to recliner) Assistive device: Rolling walker (2 wheels) Gait Pattern/deviations: Step-to pattern, Wide base of support       General Gait Details: increased knee unsteadiness bilaterally, patient only able to stand for <2 minutes before requesting to sit back down due to fatigue  Stairs            Wheelchair Mobility    Modified Rankin (Stroke Patients Only)       Balance Overall balance assessment: Needs assistance Sitting-balance support: Bilateral upper extremity supported, Feet supported Sitting balance-Leahy Scale: Fair Sitting balance - Comments: requires UE support, initially MIN A, but able to progress to static sitting with SUPV   Standing balance support: Bilateral upper extremity supported, During functional activity, Reliant on assistive device for balance Standing balance-Leahy Scale: Poor Standing balance comment: heavy reliance on RW for stability; +2 for safety at [facility]                             Pertinent Vitals/Pain Pain Assessment Pain Assessment: 0-10 Pain Score: 4  Pain Location: R knee Pain Descriptors / Indicators: Discomfort Pain Intervention(s): Limited activity within patient's tolerance, Monitored during session, Repositioned    Home Living Family/patient expects to be discharged to:: Private residence Living Arrangements: Children Available Help at Discharge: Available 24 hours/day Type of Home: House Home Access: Stairs to enter Entrance Stairs-Rails: Psychiatric nurse of Steps: 2   Home Layout: Able to live on main level with bedroom/bathroom Home Equipment: Conservation officer, nature (2 wheels);Rollator (4 wheels);Wheelchair - manual;BSC/3in1;Shower seat - built in      Prior Function Prior Level of Function : Needs assist;Independent/Modified Independent;History of Falls (last six  months)       Physical Assist : Mobility (physical);ADLs (physical)   ADLs (physical): IADLs Mobility Comments: Pt states he uses RW only to pivot to various surfaces such as chair, wheelchair, or commode. Uses w/c for all functional mobility d/t chronic knee pain. ADLs Comments: Pt reports being able to dress and bathe himself, uses AE for LB occasionally at baseline. States that his daughter or her boyfriend assist with transportation as well as most HH IADLs, boyfriend does most the cooking.     Hand Dominance   Dominant Hand: Right    Extremity/Trunk Assessment   Upper Extremity Assessment Upper Extremity Assessment: Generalized weakness;Defer to OT evaluation    Lower Extremity Assessment Lower Extremity Assessment: Generalized weakness (at least 3/5 strength bilaterally)       Communication   Communication: HOH  Cognition Arousal/Alertness: Awake/alert Behavior During Therapy: WFL for tasks assessed/performed Overall Cognitive Status: Within Functional Limits for tasks assessed                                 General Comments: A&Ox3        General Comments General comments (skin integrity, edema, etc.): SpO2 remained >90% throughout session, HR ranged from 89-114bpm throughout session    Exercises Other Exercises Other Exercises: PT ed re: role, importance of OOB activity, strengthening, d/c planning   Assessment/Plan    PT Assessment Patient needs continued PT services  PT Problem List Decreased strength;Decreased balance;Decreased range of motion;Decreased mobility;Decreased activity tolerance;Decreased coordination;Decreased skin integrity;Pain;Decreased knowledge of use of DME;Decreased safety awareness  PT Treatment Interventions DME instruction;Functional mobility training;Balance training;Patient/family education;Gait training;Therapeutic activities;Stair training;Therapeutic exercise    PT Goals (Current goals can be found in the  Care Plan section)  Acute Rehab PT Goals Patient Stated Goal: to get better PT Goal Formulation: With patient Time For Goal Achievement: 10/31/21 Potential to Achieve Goals: Good    Frequency Min 2X/week     Co-evaluation               AM-PAC PT "6 Clicks" Mobility  Outcome Measure Help needed turning from your back to your side while in a flat bed without using bedrails?: A Lot Help needed moving from lying on your back to sitting on the side of a flat bed without using bedrails?: A Lot Help needed moving to and from a bed to a chair (including a wheelchair)?: A Lot Help needed standing up from a chair using your arms (e.g., wheelchair or bedside chair)?: A Little Help needed to walk in hospital room?: A Lot Help needed climbing 3-5 steps with a railing? : Total 6 Click Score: 12    End of Session Equipment Utilized During Treatment: Gait belt Activity Tolerance: Patient tolerated treatment well;Patient limited by fatigue Patient left: in chair;with call bell/phone within reach;with chair alarm set Nurse Communication: Mobility status PT Visit Diagnosis: Unsteadiness on feet (R26.81);Muscle weakness (generalized) (M62.81);Difficulty in walking, not elsewhere classified (R26.2)    Time: 8341-9622 PT Time Calculation (min) (ACUTE ONLY): 41 min   Charges:   PT Evaluation $PT Eval Low Complexity: 1 Low PT Treatments $Therapeutic Activity: 23-37 mins        Iva Boop, PT  10/17/21. 11:08 AM

## 2021-10-17 NOTE — Assessment & Plan Note (Signed)
Renal function seems stable around baseline. -Monitor renal function -Avoid nephrotoxins

## 2021-10-17 NOTE — Assessment & Plan Note (Signed)
Currently saturating well on his baseline oxygen requirement of 2 L. -Continue with supplemental oxygen to keep the saturation above 90% -As needed bronchodilators

## 2021-10-17 NOTE — Assessment & Plan Note (Signed)
Patient met sepsis criteria with fever, leukocytosis, tachycardia and tachypnea.  COVID-19 and flu PCR negative.  Not much significant upper respiratory symptoms.  Did had some vomiting yesterday, no diarrhea.  Continue to feel weak.  Denies any urinary symptoms. Initial blood cultures negative in 24 hours, urinary cultures with 10,000 colonies of Enterococcus faecalis.  Procalcitonin at 0.17>>0.26 -We will repeat chest x-ray. -Continue with cefepime -Follow-up urinary cultures-for susceptibility -Symptomatic management

## 2021-10-17 NOTE — Progress Notes (Signed)
Progress Note   Patient: Christopher [name] [id number] DOB: Oct 18, 1950 DOA: 10/15/2021     2 DOS: the patient was seen and examined on 10/17/2021   Brief hospital course: Taken from H&P.  Inocencio Roy is a 71 y.o. male with medical history significant for chronic diastolic heart failure, chronic hypoxic hypercapnic respiratory failure on 2 L continuous nasal cannula, hyperlipidemia, acquired hypothyroidism, stage IIIb chronic kidney disease with baseline creatinine 1.5-1.9, who is admitted to [facility] on 10/15/2021 with severe sepsis of unclear source after presenting from home to [facility] ED complaining of generalized weakness.  No focal weakness.  In ED he was found to be febrile at 102.1, mildly tachycardic and tachypneic, and leukocytosis meeting sepsis criteria.  No obvious source found yet.  Has an history of vomiting yesterday, might be GI source. Initial blood cultures remain negative in 12 hours, UA with trace leukocytes and cultures pending. In ED he receives fluids per sepsis protocol and IV acetaminophen for fever and was started on broad-spectrum antibiotics.  2/25: Appetite remains poor, no nausea, vomiting or diarrhea.  Continues to have generalized weakness. Blood cultures remain negative.  Urine cultures with 10,000 colonies of Enterococcus faecalis. Ordered repeat chest x-ray.  Remained febrile   Assessment and Plan: * Severe sepsis ([place])- (present on admission) Patient met sepsis criteria with fever, leukocytosis, tachycardia and tachypnea.  COVID-19 and flu PCR negative.  Not much significant upper respiratory symptoms.  Did had some vomiting yesterday, no diarrhea.  Continue to feel weak.  Denies any urinary symptoms. Initial blood cultures negative in 24 hours, urinary cultures with 10,000 colonies of Enterococcus faecalis.  Procalcitonin at 0.17>>0.26 -We will repeat chest x-ray. -Continue with cefepime -Follow-up urinary cultures-for susceptibility -Symptomatic  management  Chronic diastolic CHF (congestive heart failure) ([place])- (present on admission) Prior echocardiogram in August 2022 with normal EF and indeterminate diastolic parameters.  BNP within normal limit.  He was on home torsemide which was being held initially as he received some IV fluid for concern of sepsis.  Currently appears euvolemic. -Strict intake and output -Will resume torsemide from tomorrow.  Stage 3b chronic kidney disease (CKD) ([place])- (present on admission) Renal function seems stable around baseline. -Monitor renal function -Avoid nephrotoxins  Chronic respiratory failure with hypoxia (HCC) Currently saturating well on his baseline oxygen requirement of 2 L. -Continue with supplemental oxygen to keep the saturation above 90% -As needed bronchodilators  Hypothyroidism- (present on admission) TSH within normal limit. -Continue home Synthroid  Generalized weakness - PT/OT evaluation  Hyperlipidemia- (present on admission) - Continue home statin  Depression- (present on admission) - Continue home Wellbutrin  Morbid obesity with BMI of 45.0-49.9, adult (HCC) Estimated body mass index is 42.04 kg/m as calculated from the following:   Height as of this encounter: 6' (1.829 m).   Weight as of this encounter: 140.6 kg.  This will complicate overall prognosis.     Subjective: Patient was seen and examined today.  Appetite remains poor but denies any nausea, vomiting or diarrhea.  Denies any pain.  Physical Exam: Vitals:   10/17/21 0742 10/17/21 0840 10/17/21 1147 10/17/21 1223  BP:  105/62 109/69 120/62  Pulse:  83 90   Resp:  18 20   Temp: 97.8 F (36.6 C) 98.6 F (37 C) 98.2 F (36.8 C)   TempSrc: Oral Oral Oral   SpO2:  99% 92%   Weight:      Height:       General.  Morbidly obese gentleman in no acute  distress. Pulmonary.  Lungs clear bilaterally, normal respiratory effort. CV.  Regular rate and rhythm, no JVD, rub or murmur. Abdomen.  Soft,  nontender, nondistended, BS positive. CNS.  Alert and oriented .  No focal neurologic deficit. Extremities.  No edema, no cyanosis, pulses intact and symmetrical. Psychiatry.  Judgment and insight appears normal.  Data Reviewed: Prior notes and labs reviewed.  Family Communication: Discussed with patient.  Disposition: Status is: Inpatient Remains inpatient appropriate because: Severity of illness.  PT/OT recommending SNF.   Planned Discharge Destination: Skilled nursing facility   Time spent: 45 minutes  This record has been created using Systems analyst. Errors have been sought and corrected,but may not always be located. Such creation errors do not reflect on the standard of care.  Author: Lorella Nimrod, MD 10/17/2021 1:12 PM  For on call review [email or web address].

## 2021-10-18 LAB — CBC
HCT: 35.4 % — ABNORMAL LOW (ref 39.0–52.0)
Hemoglobin: 10.9 g/dL — ABNORMAL LOW (ref 13.0–17.0)
MCH: 25 pg — ABNORMAL LOW (ref 26.0–34.0)
MCHC: 30.8 g/dL (ref 30.0–36.0)
MCV: 81.2 fL (ref 80.0–100.0)
Platelets: 234 10*3/uL (ref 150–400)
RBC: 4.36 MIL/uL (ref 4.22–5.81)
RDW: 15.6 % — ABNORMAL HIGH (ref 11.5–15.5)
WBC: 10.4 10*3/uL (ref 4.0–10.5)
nRBC: 0 % (ref 0.0–0.2)

## 2021-10-18 LAB — BASIC METABOLIC PANEL
Anion gap: 7 (ref 5–15)
BUN: 30 mg/dL — ABNORMAL HIGH (ref 8–23)
CO2: 27 mmol/L (ref 22–32)
Calcium: 8.5 mg/dL — ABNORMAL LOW (ref 8.9–10.3)
Chloride: 100 mmol/L (ref 98–111)
Creatinine, Ser: 1.5 mg/dL — ABNORMAL HIGH (ref 0.61–1.24)
GFR, Estimated: 49 mL/min — ABNORMAL LOW (ref >60)
Glucose, Bld: 98 mg/dL (ref 70–99)
Potassium: 3.8 mmol/L (ref 3.5–5.1)
Sodium: 134 mmol/L — ABNORMAL LOW (ref 135–145)

## 2021-10-18 LAB — PROCALCITONIN: Procalcitonin: 0.29 ng/mL

## 2021-10-18 MED ORDER — AZITHROMYCIN 500 MG PO TABS: 250.0000 mg | ORAL_TABLET | Freq: Every day | ORAL | Status: DC

## 2021-10-18 MED ORDER — AZITHROMYCIN 500 MG PO TABS
500.0000 mg | ORAL_TABLET | Freq: Every day | ORAL | Status: AC
  Administered 2021-10-18: 500 mg via ORAL
  Filled 2021-10-18: qty 1

## 2021-10-18 MED ORDER — CEPHALEXIN 500 MG PO CAPS: 500.0000 mg | ORAL_CAPSULE | Freq: Two times a day (BID) | ORAL | 0 refills | Status: DC

## 2021-10-18 MED ORDER — ENOXAPARIN SODIUM 80 MG/0.8ML IJ SOSY
0.5000 mg/kg | PREFILLED_SYRINGE | INTRAMUSCULAR | Status: DC
  Administered 2021-10-18 – 2021-10-19 (×2): 75 mg via SUBCUTANEOUS
  Filled 2021-10-18 (×3): qty 0.75

## 2021-10-18 MED ORDER — AZITHROMYCIN 250 MG PO TABS
250.0000 mg | ORAL_TABLET | Freq: Every day | ORAL | 0 refills | Status: DC
Start: 2021-10-19 — End: 2021-10-20

## 2021-10-18 NOTE — Progress Notes (Signed)
Progress Note   Patient: Christopher [name] YEB:[id number] DOB: 1951-07-03 DOA: 10/15/2021     3 DOS: the patient was seen and examined on 10/18/2021   Brief hospital course: Taken from H&P.  Lindsay Soulliere is a 71 y.o. male with medical history significant for chronic diastolic heart failure, chronic hypoxic hypercapnic respiratory failure on 2 L continuous nasal cannula, hyperlipidemia, acquired hypothyroidism, stage IIIb chronic kidney disease with baseline creatinine 1.5-1.9, who is admitted to [facility] on 10/15/2021 with severe sepsis of unclear source after presenting from home to [facility] ED complaining of generalized weakness.  No focal weakness.  In ED he was found to be febrile at 102.1, mildly tachycardic and tachypneic, and leukocytosis meeting sepsis criteria.  No obvious source found yet.  Has an history of vomiting yesterday, might be GI source. Initial blood cultures remain negative in 12 hours, UA with trace leukocytes and cultures pending. In ED he receives fluids per sepsis protocol and IV acetaminophen for fever and was started on broad-spectrum antibiotics.  2/25: Appetite remains poor, no nausea, vomiting or diarrhea.  Continues to have generalized weakness. Blood cultures remain negative.  Urine cultures with 10,000 colonies of Enterococcus faecalis. Ordered repeat chest x-ray.  Remained febrile  2/26: Patient remained afebrile over the past 24 hours.  Clinically stable.  He wants to go home.  Final urine culture results still pending.  PT/OT is recommending SNF.   Assessment and Plan: * Severe sepsis ([place])- (present on admission) Patient met sepsis criteria with fever, leukocytosis, tachycardia and tachypnea.  COVID-19 and flu PCR negative.  Not much significant upper respiratory symptoms.  Did had some vomiting yesterday, no diarrhea.  Continue to feel weak.  Denies any urinary symptoms. Initial blood cultures negative in 24 hours, urinary cultures with 10,000 colonies of  Enterococcus faecalis.  Procalcitonin at 0.17>>0.26>.0.29 Repeat chest x-ray with some vascular prominence. -Start him on Zithromax -Continue with cefepime -Follow-up urinary cultures-for susceptibility -Symptomatic management  Chronic diastolic CHF (congestive heart failure) ([place])- (present on admission) Prior echocardiogram in August 2022 with normal EF and indeterminate diastolic parameters.  BNP within normal limit.  He was on home torsemide which was being held initially as he received some IV fluid for concern of sepsis.  Currently appears euvolemic. -Strict intake and output -Resume home torsemide.  Stage 3b chronic kidney disease (CKD) ([place])- (present on admission) Renal function seems stable around baseline. -Monitor renal function -Avoid nephrotoxins  Chronic respiratory failure with hypoxia (HCC) Currently saturating well on his baseline oxygen requirement of 2 L. -Continue with supplemental oxygen to keep the saturation above 90% -As needed bronchodilators  Hypothyroidism- (present on admission) TSH within normal limit. -Continue home Synthroid  Generalized weakness - PT/OT evaluation  Hyperlipidemia- (present on admission) - Continue home statin  Depression- (present on admission) - Continue home Wellbutrin  Morbid obesity with BMI of 45.0-49.9, adult (HCC) Estimated body mass index is 42.04 kg/m as calculated from the following:   Height as of this encounter: 6' (1.829 m).   Weight as of this encounter: 140.6 kg.  This will complicate overall prognosis.     Subjective: Patient was seen and examined today.  Continues to have some cough.  He wants to go home.  No other complaints.  Physical Exam: Vitals:   10/18/21 0444 10/18/21 0500 10/18/21 0956 10/18/21 1211  BP: 120/63  123/68 117/71  Pulse: 82  83 81  Resp:   20 20  Temp: 98.3 F (36.8 C)  98.3 F (36.8 C) 98.2 F (  36.8 C)  TempSrc: Oral     SpO2: 93%  96% 94%  Weight:  (!) 150.2 kg     Height:       General.  Obese elderly man, in no acute distress. Pulmonary.  Lungs clear bilaterally, normal respiratory effort. CV.  Regular rate and rhythm, no JVD, rub or murmur. Abdomen.  Soft, nontender, nondistended, BS positive. CNS.  Alert and oriented .  No focal neurologic deficit. Extremities.  No edema, no cyanosis, pulses intact and symmetrical. Psychiatry.  Judgment and insight appears normal.  Data Reviewed: I reviewed prior notes, labs and imaging  Family Communication: Daughter was updated  Disposition: Status is: Inpatient Remains inpatient appropriate because: Severity of illness   Planned Discharge Destination: Skilled nursing facility  DVT prophylaxis.  Lovenox  Time spent: 40 minutes  This record has been created using Systems analyst. Errors have been sought and corrected,but may not always be located. Such creation errors do not reflect on the standard of care.  Author: Lorella Nimrod, MD 10/18/2021 3:03 PM  For on call review [email or web address].

## 2021-10-18 NOTE — Assessment & Plan Note (Signed)
Renal function seems stable around baseline. -Monitor renal function -Avoid nephrotoxins

## 2021-10-18 NOTE — TOC Progression Note (Signed)
Transition of Care [facility]) - Progression Note    Patient Details  Name: Christopher [name] MRN: [id number] Date of Birth: 1951-08-03  Transition of [facility]) CM/SW Contact  Eileen Stanford, LCSW Phone Number: 10/18/2021, 2:45 PM  Clinical Narrative:   Per MD pt wants to dc home with daughter. However, CSW spoke with pt's daughter and she said pt needs to go to SNF per the rec from PT eval. CSW has notified RN and MD. [name] is requesting a snf in [place]. CSW has sent referral.         Expected Discharge Plan and Services           Expected Discharge Date: 10/18/21                                     Social Determinants of Health (SDOH) Interventions    Readmission Risk Interventions Readmission Risk Prevention Plan 04/26/2021  Transportation Screening Complete  PCP or Specialist Appt within 3-5 Days Complete  HRI or [place] Complete  Social Work Consult for [place] Planning/Counseling Complete  Palliative Care Screening Not Applicable  Medication Review Press photographer) Complete

## 2021-10-18 NOTE — Assessment & Plan Note (Signed)
Prior echocardiogram in August 2022 with normal EF and indeterminate diastolic parameters.  BNP within normal limit.  He was on home torsemide which was being held initially as he received some IV fluid for concern of sepsis.  Currently appears euvolemic. -Strict intake and output -Resume home torsemide.

## 2021-10-18 NOTE — Assessment & Plan Note (Signed)
Patient met sepsis criteria with fever, leukocytosis, tachycardia and tachypnea.  COVID-19 and flu PCR negative.  Not much significant upper respiratory symptoms.  Did had some vomiting yesterday, no diarrhea.  Continue to feel weak.  Denies any urinary symptoms. Initial blood cultures negative in 24 hours, urinary cultures with 10,000 colonies of Enterococcus faecalis.  Procalcitonin at 0.17>>0.26>.0.29 Repeat chest x-ray with some vascular prominence. -Start him on Zithromax -Continue with cefepime -Follow-up urinary cultures-for susceptibility -Symptomatic management

## 2021-10-18 NOTE — Discharge Summary (Signed)
Physician Discharge Summary   Patient: Christopher [name] MRN: [id number] DOB: 08-Jun-1951  Admit date:     10/15/2021  Discharge date: 10/20/21  Discharge Physician: Lorella Nimrod   PCP: Rusty Aus, MD   Recommendations at discharge:  Please obtain CBC and BMP in 1 week Follow-up with primary care provider in 1 week Follow-up final culture results  Discharge Diagnoses: Principal Problem:   Severe sepsis [facility]) Active Problems:   Chronic diastolic CHF (congestive heart failure) ([place])   Stage 3b chronic kidney disease (CKD) ([place])   Chronic respiratory failure with hypoxia ([place])   Hypothyroidism   Generalized weakness   Hyperlipidemia   Morbid obesity with BMI of 45.0-49.9, adult ([place])   Depression  Resolved Problems:   Fever  Hospital Course: Taken from H&P.  Christopher [name] is a 71 y.o. 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  No focal weakness.  In ED he was found to be febrile at 102.1, mildly tachycardic and tachypneic, and leukocytosis meeting sepsis criteria.  No obvious source found yet.  Has an history of vomiting yesterday, might be GI source. Initial blood cultures remain negative in 12 hours, UA with trace leukocytes and cultures pending. In ED he receives fluids per sepsis protocol and IV acetaminophen for fever and was started on broad-spectrum antibiotics.  2/25: Appetite remains poor, no nausea, vomiting or diarrhea.  Continues to have generalized weakness. Blood cultures remain negative.  Urine cultures with 10,000 colonies of Enterococcus faecalis. Ordered repeat chest x-ray.  Remained  febrile  2/26: Patient remained afebrile over the past 24 hours.  Clinically stable.  He wants to go home.  Final urine culture results still pending.  PT/OT is recommending SNF.  2/27: Patient remained stable.  He wants to go home and asking for reevaluation by PT.  Daughter wants him to go to rehab.  Patient remained afebrile.  Repeat chest x-ray with some interstitial prominence.  Zithromax was added for concern of bronchitis.  Culture remains negative.  Patient wants to go home and feels at his baseline.  He completed the course of antibiotics while in the hospital and will follow-up with his primary care provider for further recommendations.  Initially over PT and OT recommended rehab.  Unable to find a place for him.  He was able to walk few steps and can climb couple of stairs which is his baseline.  Talked with daughter and he has all the DME needs.  We ordered maximum home health services and he is being discharged home with his daughter.  Patient will continue rest of his home medications and follow-up with his providers.  Assessment and Plan: * Severe sepsis ([place])- (present on admission) Patient met sepsis criteria with fever, leukocytosis, tachycardia and tachypnea.  COVID-19 and flu PCR negative.  Not much significant upper respiratory symptoms.  Did had some vomiting yesterday, no diarrhea.  Continue to feel weak.  Denies any urinary symptoms. Initial blood cultures negative in 24 hours, urinary cultures with 10,000 colonies of Enterococcus faecalis.  Procalcitonin at 0.17>>0.26>.0.29 Repeat chest x-ray with some vascular prominence. Completed the course of cefepime and Zithromax  -Symptomatic management  Chronic diastolic CHF (congestive heart failure) ([place])- (present on admission) Prior echocardiogram  in August 2022 with normal EF and indeterminate diastolic parameters.  BNP within normal limit.  He was on home torsemide which was being held initially as he received some IV fluid  for concern of sepsis.  Currently appears euvolemic. -Strict intake and output -Resume home torsemide.  Stage 3b chronic kidney disease (CKD) ([place])- (present on admission) Renal function seems stable around baseline. -Monitor renal function -Avoid nephrotoxins  Chronic respiratory failure with hypoxia (HCC) Currently saturating well on his baseline oxygen requirement of 2 L. -Continue with supplemental oxygen to keep the saturation above 90% -As needed bronchodilators  Hypothyroidism- (present on admission) TSH within normal limit. -Continue home Synthroid  Generalized weakness - PT/OT evaluation  Hyperlipidemia- (present on admission) - Continue home statin  Depression- (present on admission) - Continue home Wellbutrin  Morbid obesity with BMI of 45.0-49.9, adult (HCC) Estimated body mass index is 42.04 kg/m as calculated from the following:   Height as of this encounter: 6' (1.829 m).   Weight as of this encounter: 140.6 kg.  This will complicate overall prognosis.     Consultants: None Procedures performed: None Disposition: Home health Diet recommendation:  Discharge Diet Orders (From admission, onward)     Start     Ordered   10/18/21 0000  Diet - low sodium heart healthy        10/18/21 1408           Regular diet  DISCHARGE MEDICATION: Allergies as of 10/20/2021       Reactions   Albuterol Other (See Comments)   Causes afib    Gramineae Pollens Other (See Comments)   Sneeze and watery eyes Sneeze and watery eyes   Other Other (See Comments)   Other reaction(s): Other (See Comments) Unable to breath when around cats Other reaction(s): Other (See Comments) Unable to breath when around cats        Medication List     TAKE these medications    acetaminophen 325 MG tablet Commonly known as: TYLENOL Take 2 tablets (650 mg total) by mouth every 6 (six) hours as needed for mild pain, fever, headache or moderate pain.   aspirin 81 MG EC  tablet Take 81 mg by mouth at bedtime.   atorvastatin 20 MG tablet Commonly known as: LIPITOR Take 20 mg by mouth at bedtime.   buPROPion 150 MG 12 hr tablet Commonly known as: WELLBUTRIN SR Take 150 mg by mouth 2 (two) times daily. After 1st meal of day and after dinner   Cholecalciferol 25 MCG (1000 UT) capsule Take 1,000 Units by mouth at bedtime.   cyanocobalamin 1000 MCG tablet Take 1,000 mcg by mouth 2 (two) times a week. At bedtime. Sundays and Wednesdays   Flovent HFA 110 MCG/ACT inhaler Generic drug: fluticasone Inhale 1 puff into the lungs 2 (two) times daily.   hydrocortisone cream 1 % Apply topically as needed for itching.   ipratropium 0.06 % nasal spray Commonly known as: ATROVENT Place 2 sprays into both nostrils 3 (three) times daily as needed for rhinitis.   levalbuterol 0.63 MG/3ML nebulizer solution Commonly known as: XOPENEX Take 3 mLs (0.63 mg total) by nebulization every 6 (six) hours as needed for wheezing or shortness of breath.   levalbuterol 45 MCG/ACT inhaler Commonly known as: XOPENEX HFA Inhale 2 puffs into the lungs every 4 (four) hours as needed for wheezing.   levothyroxine 88 MCG tablet Commonly known as: SYNTHROID Take 88 mcg by mouth daily. 30 to 60 minutes before breakfast  on an empty stomach and with a glass of water   loratadine 10 MG tablet Commonly known as: CLARITIN Take 10 mg by mouth daily.   midodrine 10 MG tablet Commonly known as: PROAMATINE Take 1 tablet (10 mg total) by mouth 3 (three) times daily with meals.   OXYGEN Inhale 2 L into the lungs continuous.   potassium chloride SA 20 MEQ tablet Commonly known as: KLOR-CON M Take 1 tablet (20 mEq total) by mouth daily.   PreserVision AREDS 2 Caps Take 1 capsule by mouth 2 (two) times daily. With food. After 1st meal of day and after dinner   torsemide 20 MG tablet Commonly known as: DEMADEX Take 1 tablet (20 mg total) by mouth daily.   traMADol 50 MG  tablet Commonly known as: ULTRAM Take 50 mg by mouth every 12 (twelve) hours as needed for moderate pain.   trolamine salicylate 10 % cream Commonly known as: ASPERCREME Apply 1 application topically as needed for muscle pain. Apply to knees and feet   vitamin A & D ointment Apply 1 application topically as needed. To groin area.        Follow-up Information     Rusty Aus, MD. Schedule an appointment as soon as possible for a visit in 1 week(s).   Specialty: Internal Medicine Contact information: [place] [address] [phone]                 Discharge Exam: Filed Weights   10/17/21 0500 10/18/21 0500 10/19/21 0500  Weight: (!) 153.1 kg (!) 150.2 kg (!) 150 kg   General.  Morbidly obese gentleman, in no acute distress. Pulmonary.  Lungs clear bilaterally, normal respiratory effort. CV.  Regular rate and rhythm, no JVD, rub or murmur. Abdomen.  Soft, nontender, nondistended, BS positive. CNS.  Alert and oriented .  No focal neurologic deficit. Extremities.  No edema, no cyanosis, pulses intact and symmetrical. Psychiatry.  Judgment and insight appears normal.   Condition at discharge: stable  The results of significant diagnostics from this hospitalization (including imaging, microbiology, ancillary and laboratory) are listed below for reference.   Imaging Studies: DG Chest 2 View  Result Date: 10/17/2021 CLINICAL DATA:  Chronic diastolic heart failure. Chronic hypoxic hypercapnic respiratory failure. EXAM: CHEST - 2 VIEW COMPARISON:  10/15/2021 FINDINGS: Mild cardiomegaly and pulmonary vascular congestion appears stable. No evidence of acute infiltrate or edema. No evidence of pleural effusion. IMPRESSION: Stable cardiomegaly and pulmonary vascular congestion. No acute findings. Electronically Signed   By: Marlaine Hind M.D.   On: 10/17/2021 14:22   DG Chest Port 1 View  Result Date:  10/15/2021 CLINICAL DATA:  Questionable sepsis. EXAM: PORTABLE CHEST 1 VIEW COMPARISON:  Chest radiograph dated 05/22/2021. FINDINGS: No focal consolidation, pleural effusion, pneumothorax. Mild cardiomegaly with mild vascular congestion. No acute osseous pathology. IMPRESSION: Mild cardiomegaly with mild central vascular congestion. No focal consolidation. Electronically Signed   By: Anner Crete M.D.   On: 10/15/2021 21:31    Microbiology: Results for orders placed or performed during the hospital encounter of 10/15/21  Blood Culture (routine x 2)     Status: None (Preliminary result)   Collection Time: 10/15/21  8:50 PM   Specimen: BLOOD  Result Value Ref Range Status   Specimen Description BLOOD LEFT FOREARM  Final   Special Requests   Final    BOTTLES DRAWN AEROBIC AND ANAEROBIC Blood Culture adequate volume   Culture   Final  NO GROWTH 4 DAYS Performed at [facility], [address] [place], [place] [address]    Report Status PENDING  Incomplete  Resp Panel by RT-PCR (Flu A&B, Covid) Nasopharyngeal Swab     Status: None   Collection Time: 10/15/21  8:50 PM   Specimen: Nasopharyngeal Swab; Nasopharyngeal(NP) swabs in vial transport medium  Result Value Ref Range Status   SARS Coronavirus 2 by RT PCR NEGATIVE NEGATIVE Final    Comment: (NOTE) SARS-CoV-2 target nucleic acids are NOT DETECTED.  The SARS-CoV-2 RNA is generally detectable in upper respiratory specimens during the acute phase of infection. The lowest concentration of SARS-CoV-2 viral copies this assay can detect is 138 copies/mL. A negative result does not preclude SARS-Cov-2 infection and should not be used as the sole basis for treatment or other patient management decisions. A negative result may occur with  improper specimen collection/handling, submission of specimen other than nasopharyngeal swab, presence of viral mutation(s) within the areas targeted by this assay, and inadequate number of  viral copies(<138 copies/mL). A negative result must be combined with clinical observations, patient history, and epidemiological information. The expected result is Negative.  Fact Sheet for Patients:  EntrepreneurPulse.com.au  Fact Sheet for Healthcare Providers:  IncredibleEmployment.be  This test is no t yet approved or cleared by the [place] FDA and  has been authorized for detection and/or diagnosis of SARS-CoV-2 by FDA under an Emergency Use Authorization (EUA). This EUA will remain  in effect (meaning this test can be used) for the duration of the COVID-19 declaration under Section 564(b)(1) of the Act, 21 U.S.C.section 360bbb-3(b)(1), unless the authorization is terminated  or revoked sooner.       Influenza A by PCR NEGATIVE NEGATIVE Final   Influenza B by PCR NEGATIVE NEGATIVE Final    Comment: (NOTE) The Xpert Xpress SARS-CoV-2/FLU/RSV plus assay is intended as an aid in the diagnosis of influenza from Nasopharyngeal swab specimens and should not be used as a sole basis for treatment. Nasal washings and aspirates are unacceptable for Xpert Xpress SARS-CoV-2/FLU/RSV testing.  Fact Sheet for Patients: EntrepreneurPulse.com.au  Fact Sheet for Healthcare Providers: IncredibleEmployment.be  This test is not yet approved or cleared by the [place] FDA and has been authorized for detection and/or diagnosis of SARS-CoV-2 by FDA under an Emergency Use Authorization (EUA). This EUA will remain in effect (meaning this test can be used) for the duration of the COVID-19 declaration under Section 564(b)(1) of the Act, 21 U.S.C. section 360bbb-3(b)(1), unless the authorization is terminated or revoked.  Performed at [facility], [address] [place], [place] [address]   Urine Culture     Status: Abnormal   Collection Time: 10/15/21 10:40 PM   Specimen: Urine, Random  Result  Value Ref Range Status   Specimen Description   Final    URINE, RANDOM Performed at [facility], [address] [place], [place] [address]    Special Requests   Final    NONE Performed at [facility], [place], [place] [address]    Culture 10,000 COLONIES/mL ENTEROCOCCUS FAECALIS (A)  Final   Report Status 10/19/2021 FINAL  Final   Organism ID, Bacteria ENTEROCOCCUS FAECALIS (A)  Final      Susceptibility   Enterococcus faecalis - MIC*    AMPICILLIN <=2 SENSITIVE Sensitive     NITROFURANTOIN <=16 SENSITIVE Sensitive     VANCOMYCIN <=0.5 SENSITIVE Sensitive     * 10,000 COLONIES/mL ENTEROCOCCUS FAECALIS  Culture,  blood (Routine X 2) w Reflex to ID Panel     Status: None (Preliminary result)   Collection Time: 10/16/21  4:26 PM   Specimen: BLOOD RIGHT HAND  Result Value Ref Range Status   Specimen Description BLOOD RIGHT HAND  Final   Special Requests   Final    BOTTLES DRAWN AEROBIC AND ANAEROBIC Blood Culture adequate volume   Culture   Final    NO GROWTH 3 DAYS Performed at [facility], [address] [place], [place] [address]    Report Status PENDING  Incomplete    Labs: CBC: Recent Labs  Lab 10/15/21 2050 10/16/21 0357 10/17/21 0853 10/18/21 0524  WBC 22.1* 18.2* 16.1* 10.4  NEUTROABS 20.4* 15.5*  --   --   HGB 13.0 11.9* 11.3* 10.9*  HCT 42.4 39.2 36.9* 35.4*  MCV 82.5 83.2 82.2 81.2  PLT 315 252 236 979   Basic Metabolic Panel: Recent Labs  Lab 10/15/21 2050 10/16/21 0357 10/17/21 0853 10/18/21 0524  NA 137 139 134* 134*  K 4.5 4.5 4.2 3.8  CL 97* 99 97* 100  CO2 '27 30 27 27  ' GLUCOSE 132* 115* 99 98  BUN 31* 28* 31* 30*  CREATININE 1.75* 1.76* 1.56* 1.50*  CALCIUM 9.4 8.9 8.8* 8.5*  MG 1.9 1.7  --   --    Liver Function Tests: Recent Labs  Lab 10/15/21 2050 10/16/21 0357  AST 18 12*  ALT 8 8  ALKPHOS 60 52  BILITOT 1.1 0.9  PROT 7.7 6.6  ALBUMIN 3.9 3.2*   CBG: No results for input(s):  GLUCAP in the last 168 hours.  Discharge time spent: greater than 30 minutes.  This record has been created using Systems analyst. Errors have been sought and corrected,but may not always be located. Such creation errors do not reflect on the standard of care.   Signed: Lorella Nimrod, MD [facility] 10/20/2021

## 2021-10-18 NOTE — NC FL2 (Signed)
[place] LEVEL OF CARE SCREENING TOOL     IDENTIFICATION  Patient Name: Christopher [name] Birthdate: Apr 26, 1951 Sex: male Admission Date (Current Location): 10/15/2021  [facility] and [place] Number:  Engineering geologist and Address:  [facility], [address] [place], [place] [address]      Provider Number: [id number]  Attending Physician Name and Address:  Lorella Nimrod, MD  Relative Name and Phone Number:       Current Level of Care: Hospital Recommended Level of Care: [place] Prior Approval Number:    Date Approved/Denied:   PASRR Number: [id number]  Discharge Plan: SNF    Current Diagnoses: Patient Active Problem List   Diagnosis Date Noted   Generalized weakness 10/16/2021   Chronic respiratory failure with hypoxia ([place]) 10/16/2021   Hypotension    SIRS (systemic inflammatory response syndrome) (HCC)    AF (paroxysmal atrial fibrillation) ([place])    Syncope    Severe sepsis ([place]) 05/20/2021   Pressure injury of skin 04/25/2021   Acute respiratory failure ([place]) 04/21/2021   Acute kidney injury superimposed on CKD ([place]) 04/21/2021   Stage 3b chronic kidney disease (CKD) ([place]) 03/22/2021   Leukocytosis 03/22/2021   Osteomyelitis of ankle or foot, acute, left ([name]) 03/17/2021   Acute on chronic respiratory failure ([place]) 03/17/2021   Osteomyelitis ([place]) 12/16/2020   Depression 12/16/2020   Medicare annual wellness visit, initial 11/14/2019   B12 deficiency 11/14/2019   Hyperlipidemia 10/31/2019   Primary osteoarthritis of right knee 03/16/2019   Sleep apnea 10/31/2018   Traumatic incomplete tear of left rotator cuff 10/02/2018   Morbid obesity with BMI of 45.0-49.9, adult ([place]) 09/20/2018   Hypothyroidism 09/20/2018   Chronic diastolic CHF (congestive heart failure) ([place]) 09/20/2018   Benign essential hypertension 09/20/2018    Orientation RESPIRATION BLADDER Height & Weight     Self, Time, Situation,  Place  Normal Continent Weight: (!) 331 lb 2.1 oz (150.2 kg) Height:  6' (182.9 cm)  BEHAVIORAL SYMPTOMS/MOOD NEUROLOGICAL BOWEL NUTRITION STATUS      Continent Diet (regluar diet)  AMBULATORY STATUS COMMUNICATION OF NEEDS Skin   Extensive Assist Verbally Normal                       Personal Care Assistance Level of Assistance  Bathing, Feeding, Dressing Bathing Assistance: Maximum assistance Feeding assistance: Limited assistance Dressing Assistance: Maximum assistance     Functional Limitations Info  Sight, Hearing, Speech Sight Info: Adequate Hearing Info: Adequate Speech Info: Adequate    SPECIAL CARE FACTORS FREQUENCY  PT (By licensed PT), OT (By licensed OT)     PT Frequency: 5x OT Frequency: 5x            Contractures Contractures Info: Not present    Additional Factors Info  Code Status, Allergies Code Status Info: full code Allergies Info: Albuterol, Gramineae Pollens, Other           Current Medications (10/18/2021):  This is the current hospital active medication list Current Facility-Administered Medications  Medication Dose Route Frequency Provider Last Rate Last Admin   acetaminophen (TYLENOL) tablet 650 mg  650 mg Oral Q6H PRN Howerter, Justin B, DO   650 mg at 10/17/21 0034   Or   acetaminophen (TYLENOL) suppository 650 mg  650 mg Rectal Q6H PRN Howerter, Justin B, DO       aspirin EC tablet 81 mg  81 mg Oral QHS Howerter, Justin B, DO   81  mg at 10/17/21 2025   atorvastatin (LIPITOR) tablet 20 mg  20 mg Oral QHS Howerter, Justin B, DO   20 mg at 10/17/21 2025   [START ON 10/19/2021] azithromycin (ZITHROMAX) tablet 250 mg  250 mg Oral Daily Lorella Nimrod, MD       buPROPion [facility] SR) 12 hr tablet 150 mg  150 mg Oral BID WC Howerter, Justin B, DO   150 mg at 10/18/21 1133   ceFEPIme (MAXIPIME) 2 g in sodium chloride 0.9 % 100 mL IVPB  2 g Intravenous Q12H Renda Rolls, RPH 200 mL/hr at 10/18/21 1139 2 g at 10/18/21 1139   levalbuterol  (XOPENEX) nebulizer solution 1.25 mg  1.25 mg Nebulization Q4H PRN Howerter, Justin B, DO       levothyroxine (SYNTHROID) tablet 88 mcg  88 mcg Oral Q0600 Howerter, Justin B, DO   88 mcg at 10/18/21 1134   loratadine (CLARITIN) tablet 10 mg  10 mg Oral Daily Howerter, Justin B, DO   10 mg at 10/18/21 1135   midodrine (PROAMATINE) tablet 10 mg  10 mg Oral TID WC Howerter, Justin B, DO   10 mg at 10/18/21 1133   ondansetron (ZOFRAN) injection 4 mg  4 mg Intravenous Q6H PRN Sharion Settler, NP   4 mg at 10/17/21 2025   torsemide (DEMADEX) tablet 20 mg  20 mg Oral Daily Lorella Nimrod, MD   20 mg at 10/18/21 1133     Discharge Medications: Please see discharge summary for a list of discharge medications.  Relevant Imaging Results:  Relevant Lab Results:   Additional Information [id number]  Eileen Stanford, LCSW

## 2021-10-18 NOTE — Plan of Care (Signed)

## 2021-10-19 LAB — URINE CULTURE: Culture: 10000 — AB

## 2021-10-19 LAB — PROCALCITONIN: Procalcitonin: 0.22 ng/mL

## 2021-10-19 NOTE — Assessment & Plan Note (Signed)
Renal function seems stable around baseline. -Monitor renal function -Avoid nephrotoxins

## 2021-10-19 NOTE — Progress Notes (Signed)
Occupational Therapy Treatment Patient Details Name: Christopher [name] MRN: [id number] DOB: Jan 09, 1951 Today's Date: 10/19/2021   History of present illness Pt is a 71 y/o M with PMH: dCHF, depression, anxiety, COPD, HLD, morbid obesity, hypothyroidism, and sleep apnea (on cpap at night) who presented to ED d/t sliding out of his chair and being too weak to get back up. Pt adm for sepsis workup.   OT comments  Christopher [name] was seen for OT/PT co-treatment on this date. Upon arrival to room pt  reclined in bed, agreeable to tx. Pt requires MAX A don/doff socks sitting EOB. MIN A + RW for sit<>stand and bed>chair - assist to stabilize RW as pt rocks into standing. CGA hand washing standing sink side - pt tolerates <2 min standing. Pt making good progress toward goals. Pt continues to benefit from skilled OT services to maximize return to PLOF and minimize risk of future falls, injury, caregiver burden, and readmission. Will continue to follow POC. Discharge recommendation remains appropriate.     Recommendations for follow up therapy are one component of a multi-disciplinary discharge planning process, led by the attending physician.  Recommendations may be updated based on patient status, additional functional criteria and insurance authorization.    Follow Up Recommendations  Skilled nursing-short term rehab (<3 hours/day)    Assistance Recommended at Discharge Frequent or constant Supervision/Assistance  Patient can return home with the following  A lot of help with walking and/or transfers;A lot of help with bathing/dressing/bathroom;Assistance with cooking/housework;Assist for transportation;Help with stairs or ramp for entrance   Equipment Recommendations  None recommended by OT    Recommendations for Other Services      Precautions / Restrictions Precautions Precautions: Fall Restrictions Weight Bearing Restrictions: No       Mobility Bed Mobility Overal bed mobility: Needs  Assistance Bed Mobility: Supine to Sit, Sit to Supine, Rolling Rolling: Supervision   Supine to sit: Min assist Sit to supine: Min assist   General bed mobility comments: Increased time and effort to complete, signitificant use of bed rails to complete supine to sit transfer    Transfers Overall transfer level: Needs assistance Equipment used: Rolling walker (2 wheels) Transfers: Sit to/from Stand Sit to Stand: Min assist, From elevated surface           General transfer comment: assist to stabilize RW     Balance Overall balance assessment: Needs assistance Sitting-balance support: Bilateral upper extremity supported, Feet supported Sitting balance-Leahy Scale: Fair Sitting balance - Comments: requires UE support, initially MIN A, but able to progress to static sitting with SUPV   Standing balance support: No upper extremity supported, During functional activity Standing balance-Leahy Scale: Fair                             ADL either performed or assessed with clinical judgement   ADL Overall ADL's : Needs assistance/impaired                                       General ADL Comments: MAX A don/doff socks sitting EOB. MIN A + RW for ADL t/f - assist to stabilize RW as pt rocks into standing. CGA hand washing standing sink side - pt tolerates <2 min standing      Cognition Arousal/Alertness: Awake/alert Behavior During Therapy: WFL for tasks assessed/performed Overall Cognitive Status: Within Functional Limits  for tasks assessed                                                General Comments SpO2 remained >90% throughout session    Pertinent Vitals/ Pain       Pain Assessment Pain Assessment: No/denies pain   Frequency  Min 2X/week        Progress Toward Goals  OT Goals(current goals can now be found in the care plan section)  Progress towards OT goals: Progressing toward goals  Acute Rehab OT  Goals Patient Stated Goal: to feel better OT Goal Formulation: With patient Time For Goal Achievement: 10/30/21 Potential to Achieve Goals: Fair ADL Goals Pt Will Transfer to Toilet: with min assist;stand pivot transfer;bedside commode Pt Will Perform Toileting - Clothing Manipulation and hygiene: with min assist;sitting/lateral leans Pt Will Perform Tub/Shower Transfer: with min assist;shower seat;grab bars  Plan Discharge plan remains appropriate;Frequency remains appropriate    Co-evaluation    PT/OT/SLP Co-Evaluation/Treatment: Yes Reason for Co-Treatment: For patient/therapist safety;To address functional/ADL transfers PT goals addressed during session: Mobility/safety with mobility OT goals addressed during session: ADL's and self-care      AM-PAC OT "6 Clicks" Daily Activity     Outcome Measure   Help from another person eating meals?: None Help from another person taking care of personal grooming?: A Little Help from another person toileting, which includes using toliet, bedpan, or urinal?: A Lot Help from another person bathing (including washing, rinsing, drying)?: A Lot Help from another person to put on and taking off regular upper body clothing?: A Little Help from another person to put on and taking off regular lower body clothing?: A Lot 6 Click Score: 16    End of Session Equipment Utilized During Treatment: Rolling walker (2 wheels)  OT Visit Diagnosis: Unsteadiness on feet (R26.81);Muscle weakness (generalized) (M62.81);History of falling (Z91.81)   Activity Tolerance Patient tolerated treatment well   Patient Left in chair;with call bell/phone within reach;with chair alarm set   Nurse Communication          Time: [phone] OT Time Calculation (min): 27 min  Charges: OT General Charges $OT Visit: 1 Visit OT Treatments $Self Care/Home Management : 8-22 mins  Dessie Coma, M.S. OTR/L  10/19/21, 4:33 PM  ascom [phone]

## 2021-10-19 NOTE — TOC Progression Note (Signed)
Transition of Care [facility]) - Progression Note    Patient Details  Name: Billye Nydam MRN: [id number] Date of Birth: 14-Jul-1951  Transition of Care [facility]) CM/SW [place], RN Phone Number: 10/19/2021, 2:04 PM  Clinical Narrative:   PT to re evaluate patient today for disposition.         Expected Discharge Plan and Services           Expected Discharge Date: 10/18/21                                     Social Determinants of Health (SDOH) Interventions    Readmission Risk Interventions Readmission Risk Prevention Plan 04/26/2021  Transportation Screening Complete  PCP or Specialist Appt within 3-5 Days Complete  HRI or [place] Complete  Social Work Consult for [place] Planning/Counseling Complete  Palliative Care Screening Not Applicable  Medication Review Press photographer) Complete

## 2021-10-19 NOTE — Progress Notes (Addendum)
Progress Note   Patient: Christopher [name] XBJ:[id number] DOB: 1951-04-05 DOA: 10/15/2021     4 DOS: the patient was seen and examined on 10/19/2021   Brief hospital course: Taken from H&P.  Christopher [name] is a 71 y.o. male with medical history significant for chronic diastolic heart failure, chronic hypoxic hypercapnic respiratory failure on 2 L continuous nasal cannula, hyperlipidemia, acquired hypothyroidism, stage IIIb chronic kidney disease with baseline creatinine 1.5-1.9, who is admitted to [facility] on 10/15/2021 with severe sepsis of unclear source after presenting from home to [facility] ED complaining of generalized weakness.  No focal weakness.  In ED he was found to be febrile at 102.1, mildly tachycardic and tachypneic, and leukocytosis meeting sepsis criteria.  No obvious source found yet.  Has an history of vomiting yesterday, might be GI source. Initial blood cultures remain negative in 12 hours, UA with trace leukocytes and cultures pending. In ED he receives fluids per sepsis protocol and IV acetaminophen for fever and was started on broad-spectrum antibiotics.  2/25: Appetite remains poor, no nausea, vomiting or diarrhea.  Continues to have generalized weakness. Blood cultures remain negative.  Urine cultures with 10,000 colonies of Enterococcus faecalis. Ordered repeat chest x-ray.  Remained febrile  2/26: Patient remained afebrile over the past 24 hours.  Clinically stable.  He wants to go home.  Final urine culture results still pending.  PT/OT is recommending SNF.  2/27: Patient remained stable.  He wants to go home and asking for reevaluation by PT.  Daughter wants him to go to rehab.   Assessment and Plan: * Severe sepsis ([place])- (present on admission) Patient met sepsis criteria with fever, leukocytosis, tachycardia and tachypnea.  COVID-19 and flu PCR negative.  Not much significant upper respiratory symptoms.  Did had some vomiting yesterday, no diarrhea.  Continue to feel weak.   Denies any urinary symptoms. Initial blood cultures negative in 24 hours, urinary cultures with 10,000 colonies of Enterococcus faecalis.  Procalcitonin at 0.17>>0.26>.0.29 Repeat chest x-ray with some vascular prominence. Completed the course of cefepime and Zithromax which are not discontinued. -Symptomatic management  Chronic diastolic CHF (congestive heart failure) ([place])- (present on admission) Prior echocardiogram in August 2022 with normal EF and indeterminate diastolic parameters.  BNP within normal limit.  He was on home torsemide which was being held initially as he received some IV fluid for concern of sepsis.  Currently appears euvolemic. -Strict intake and output -Resume home torsemide.  Stage 3b chronic kidney disease (CKD) ([place])- (present on admission) Renal function seems stable around baseline. -Monitor renal function -Avoid nephrotoxins  Chronic respiratory failure with hypoxia (HCC) Currently saturating well on his baseline oxygen requirement of 2 L. -Continue with supplemental oxygen to keep the saturation above 90% -As needed bronchodilators  Hypothyroidism- (present on admission) TSH within normal limit. -Continue home Synthroid  Generalized weakness - PT/OT evaluation  Hyperlipidemia- (present on admission) - Continue home statin  Depression- (present on admission) - Continue home Wellbutrin  Morbid obesity with BMI of 45.0-49.9, adult (HCC) Estimated body mass index is 42.04 kg/m as calculated from the following:   Height as of this encounter: 6' (1.829 m).   Weight as of this encounter: 140.6 kg.  This will complicate overall prognosis.     Subjective: Patient was seen and examined today.  No new complaints.  He wants to go home and asking to be reevaluated by PT.  Physical Exam: Vitals:   10/18/21 2017 10/19/21 0500 10/19/21 0505 10/19/21 0758  BP: 119/69  112/71  118/78  Pulse: 78  81 83  Resp: _0 Temp: 97.7 F (36.5 C)  97.6 F  (36.4 C) 97.8 F (36.6 C)  TempSrc: Oral  Oral Oral  SpO2: 95%  95% 94%  Weight:  (!) 150 kg    Height:       General.  Obese gentleman, in no acute distress. Pulmonary.  Lungs clear bilaterally, normal respiratory effort. CV.  Regular rate and rhythm, no JVD, rub or murmur. Abdomen.  Soft, nontender, nondistended, BS positive. CNS.  Alert and oriented .  No focal neurologic deficit. Extremities.  No edema, no cyanosis, pulses intact and symmetrical. Psychiatry.  Judgment and insight appears normal.  Data Reviewed: Prior notes and labs reviewed.  Family Communication:   Disposition: Status is: Inpatient Remains inpatient appropriate because: Waiting for SNF placement.  Now medically stable   Planned Discharge Destination: Skilled nursing facility  DVT prophylaxis.  Lovenox  Time spent: 40 minutes  This record has been created using Systems analyst. Errors have been sought and corrected,but may not always be located. Such creation errors do not reflect on the standard of care.  Author: Lorella Nimrod, MD 10/19/2021 3:53 PM  For on call review [email or web address].

## 2021-10-19 NOTE — Assessment & Plan Note (Signed)
Prior echocardiogram in August 2022 with normal EF and indeterminate diastolic parameters.  BNP within normal limit.  He was on home torsemide which was being held initially as he received some IV fluid for concern of sepsis.  Currently appears euvolemic. -Strict intake and output -Resume home torsemide.

## 2021-10-19 NOTE — Progress Notes (Signed)
Physical Therapy Treatment Patient Details Name: Christopher [name] MRN: [id number] DOB: 22-May-1951 Today's Date: 10/19/2021   History of Present Illness Pt is a 71 y/o M with PMH: dCHF, depression, anxiety, COPD, HLD, morbid obesity, hypothyroidism, and sleep apnea (on cpap at night) who presented to ED d/t sliding out of his chair and being too weak to get back up. Pt adm for sepsis workup.    PT Comments    PT/OT co-treatment completed today. Patient tolerated session well and was agreeable to treatment. Upon entry into long sitting in bed watching tv, with no reports of pain. Patient was able to demonstrate increased ease with bed mobility and transfers at supervision, however continues to require increased time/effort and use of BUE support from bed rails. Increased momentum from trunk continues to be required to completed sit to stands. Seated therapeutic exercises focused on BLE strengthening. Patient continues to be fearful to ambulate far from bed due to fear of falling, however, demonstrated increased stability with static and dynamic standing when ambulating between EOB and counter. Static and dynamic standing with RW required CGA-SBA (+ 2 for safety). Patient is progressing nicely towards his goals and would continue to benefit from skilled physical therapy in order to optimize patient's return to PLOF. Continue to recommend STR upon discharge from acute hospitalization.    Recommendations for follow up therapy are one component of a multi-disciplinary discharge planning process, led by the attending physician.  Recommendations may be updated based on patient status, additional functional criteria and insurance authorization.  Follow Up Recommendations  Skilled nursing-short term rehab (<3 hours/day)     Assistance Recommended at Discharge Set up Supervision/Assistance  Patient can return home with the following Two people to help with walking and/or transfers;A lot of help with  bathing/dressing/bathroom;Direct supervision/assist for medications management;Direct supervision/assist for financial management;Two people to help with bathing/dressing/bathroom;A lot of help with walking and/or transfers;Assist for transportation   Equipment Recommendations  Other (comment) (TBD at next venue)    Recommendations for Other Services       Precautions / Restrictions Precautions Precautions: Fall Restrictions Weight Bearing Restrictions: No     Mobility  Bed Mobility Overal bed mobility: Needs Assistance Bed Mobility: Supine to Sit, Sit to Supine, Rolling Rolling: Supervision   Supine to sit: Supervision, HOB elevated Sit to supine: Supervision   General bed mobility comments: Increased time and effort to complete, signitificant use of bed rails to complete supine to sit transfer    Transfers Overall transfer level: Needs assistance Equipment used: Rolling walker (2 wheels) Transfers: Sit to/from Stand (x3 sit to stand transfers) Sit to Stand: Min guard (SBA)           General transfer comment: elevated bed level to complete, patient required use of trunk momentum to stand, pushes up from bed and utilizes bed rail to pull up into standing as well    Ambulation/Gait Ambulation/Gait assistance: Min guard (SBA) Gait Distance (Feet): 12 Feet (4x3 steps forward/backwards from EOB to counter)   Gait Pattern/deviations: Step-to pattern, Wide base of support Gait velocity: decreased     General Gait Details: L ankle externally rotated due to previous injury, increased use of BUE support from [employer] Mobility    Modified Rankin (Stroke Patients Only)       Balance Overall balance assessment: Needs assistance Sitting-balance support: Bilateral upper extremity supported, Feet supported Sitting balance-Leahy Scale: Fair  Standing balance support: Bilateral upper extremity supported, During functional activity,  Reliant on assistive device for balance Standing balance-Leahy Scale: Fair Standing balance comment: heavy reliance on RW for stability                            Cognition Arousal/Alertness: Awake/alert Behavior During Therapy: WFL for tasks assessed/performed Overall Cognitive Status: Within Functional Limits for tasks assessed                                          Exercises General Exercises - Lower Extremity Ankle Circles/Pumps: Seated, 20 reps, AROM, Both Long Arc Quad: Seated, 10 reps, AROM, Both Hip Flexion/Marching: Seated, 20 reps, AROM, Both    General Comments General comments (skin integrity, edema, etc.): SpO2 remained >90% throughout session      Pertinent Vitals/Pain Pain Assessment Pain Assessment: No/denies pain Pain Intervention(s): Limited activity within patient's tolerance, Monitored during session, Repositioned    Home Living                          Prior Function            PT Goals (current goals can now be found in the care plan section) Acute Rehab PT Goals Patient Stated Goal: to get better PT Goal Formulation: With patient Time For Goal Achievement: 10/31/21 Potential to Achieve Goals: Good Progress towards PT goals: Progressing toward goals    Frequency    Min 2X/week      PT Plan Current plan remains appropriate    Co-evaluation PT/OT/SLP Co-Evaluation/Treatment: Yes Reason for Co-Treatment: For patient/therapist safety;To address functional/ADL transfers PT goals addressed during session: Mobility/safety with mobility;Proper use of DME;Balance;Strengthening/ROM OT goals addressed during session: ADL's and self-care;Strengthening/ROM      AM-PAC PT "6 Clicks" Mobility   Outcome Measure  Help needed turning from your back to your side while in a flat bed without using bedrails?: A Lot Help needed moving from lying on your back to sitting on the side of a flat bed without using  bedrails?: A Lot Help needed moving to and from a bed to a chair (including a wheelchair)?: A Lot Help needed standing up from a chair using your arms (e.g., wheelchair or bedside chair)?: A Little Help needed to walk in hospital room?: A Lot Help needed climbing 3-5 steps with a railing? : Total 6 Click Score: 12    End of Session Equipment Utilized During Treatment: Gait belt Activity Tolerance: Patient tolerated treatment well;Patient limited by fatigue Patient left: in chair;with call bell/phone within reach;with chair alarm set Nurse Communication: Mobility status PT Visit Diagnosis: Unsteadiness on feet (R26.81);Muscle weakness (generalized) (M62.81);Difficulty in walking, not elsewhere classified (R26.2)     Time: [id number] PT Time Calculation (min) (ACUTE ONLY): 27 min  Charges:  $Therapeutic Exercise: 8-22 mins                     Iva Boop, PT  10/19/21. 4:08 PM

## 2021-10-19 NOTE — Assessment & Plan Note (Signed)
Patient met sepsis criteria with fever, leukocytosis, tachycardia and tachypnea.  COVID-19 and flu PCR negative.  Not much significant upper respiratory symptoms.  Did had some vomiting yesterday, no diarrhea.  Continue to feel weak.  Denies any urinary symptoms. Initial blood cultures negative in 24 hours, urinary cultures with 10,000 colonies of Enterococcus faecalis.  Procalcitonin at 0.17>>0.26>.0.29 Repeat chest x-ray with some vascular prominence. Completed the course of cefepime and Zithromax which are not discontinued. -Symptomatic management

## 2021-10-19 NOTE — TOC Progression Note (Signed)
Transition of Care [facility]) - Progression Note    Patient Details  Name: Christopher [name] MRN: [id number] Date of Birth: 1951/08/16  Transition of Care [facility]) CM/SW [place], RN Phone Number: 10/19/2021, 10:13 AM  Clinical Narrative:   No bed offers at this time  Re Sent and expanded bed search.  TOC to follow.         Expected Discharge Plan and Services           Expected Discharge Date: 10/18/21                                     Social Determinants of Health (SDOH) Interventions    Readmission Risk Interventions Readmission Risk Prevention Plan 04/26/2021  Transportation Screening Complete  PCP or Specialist Appt within 3-5 Days Complete  HRI or [place] Complete  Social Work Consult for [place] Planning/Counseling Complete  Palliative Care Screening Not Applicable  Medication Review Press photographer) Complete

## 2021-10-19 NOTE — Assessment & Plan Note (Signed)
Currently saturating well on his baseline oxygen requirement of 2 L. -Continue with supplemental oxygen to keep the saturation above 90% -As needed bronchodilators

## 2021-10-20 NOTE — TOC Progression Note (Addendum)
Transition of Care [facility]) - Progression Note    Patient Details  Name: Generoso Cropper MRN: [id number] Date of Birth: 10-04-1950  Transition of Care [facility]) CM/SW [place], RN Phone Number: 10/20/2021, 2:40 PM  Clinical Narrative:   Patient did not have bed offers,  PT and OT in to see patient, changed recommendations to [place] as he was able to navigate stairs with assistance.  Hospitalist contacted daughter, who stated she is able to pick patient up to take him home.  (Her previous barrier was that he was unable to do stairs, but he is able now according to PT)  Addendum 1448:  No equipment recommended by PT or OT at this time.   Home Health:  Meg with [facility] can accept patient back to their services, but they are delayed until next week.  Meg from [facility] will contact family.    Expected Discharge Plan and Services           Expected Discharge Date: 10/20/21                                     Social Determinants of Health (SDOH) Interventions    Readmission Risk Interventions Readmission Risk Prevention Plan 04/26/2021  Transportation Screening Complete  PCP or Specialist Appt within 3-5 Days Complete  HRI or [place] Complete  Social Work Consult for [place] Planning/Counseling Complete  Palliative Care Screening Not Applicable  Medication Review Press photographer) Complete

## 2021-10-20 NOTE — Progress Notes (Signed)
Occupational Therapy Treatment Patient Details Name: Christopher [name] MRN: [id number] DOB: 1950-11-12 Today's Date: 10/20/2021   History of present illness Pt is a 71 y/o M with PMH: dCHF, depression, anxiety, COPD, HLD, morbid obesity, hypothyroidism, and sleep apnea (on cpap at night) who presented to ED d/t sliding out of his chair and being too weak to get back up. Pt adm for sepsis workup.   OT comments  Christopher [name] was seen for OT/PT co-treatment on this date. Upon arrival to room pt reclined in bed, agreeable to tx. Pt completed stair training, +2 assist for equipment mgmt. Pt requires SBA + RW for simulated BSC t/f, improved tolerance today. Good dynamic sitting balance. Pt making good progress toward goals. Pt continues to benefit from skilled OT services to maximize return to PLOF and minimize risk of future falls, injury, caregiver burden, and readmission. Will continue to follow POC. Discharge recommendation updated to [facility] with 24/7 Supervision to reflect pt progress.    Recommendations for follow up therapy are one component of a multi-disciplinary discharge planning process, led by the attending physician.  Recommendations may be updated based on patient status, additional functional criteria and insurance authorization.    Follow Up Recommendations  Home health OT    Assistance Recommended at Discharge Frequent or constant Supervision/Assistance  Patient can return home with the following  A little help with walking and/or transfers;A lot of help with bathing/dressing/bathroom;Help with stairs or ramp for entrance   Equipment Recommendations  None recommended by OT    Recommendations for Other Services      Precautions / Restrictions Precautions Precautions: Fall Restrictions Weight Bearing Restrictions: No       Mobility Bed Mobility Overal bed mobility: Needs Assistance Bed Mobility: Supine to Sit Rolling: Supervision              Transfers Overall  transfer level: Needs assistance Equipment used: Rolling walker (2 wheels) Transfers: Sit to/from Stand, Bed to chair/wheelchair/BSC Sit to Stand: Min guard     Step pivot transfers: Supervision           Balance Overall balance assessment: Needs assistance Sitting-balance support: Feet supported, No upper extremity supported Sitting balance-Leahy Scale: Good     Standing balance support: Bilateral upper extremity supported, During functional activity Standing balance-Leahy Scale: Fair                             ADL either performed or assessed with clinical judgement   ADL Overall ADL's : Needs assistance/impaired                                       General ADL Comments: SBA + RW for simulated BSC t/f, improved tolerance today. Good dynamic sitting balance      Cognition Arousal/Alertness: Awake/alert Behavior During Therapy: WFL for tasks assessed/performed Overall Cognitive Status: Within Functional Limits for tasks assessed                                          Exercises Other Exercises Other Exercises: assisted with PT stair training     Pertinent Vitals/ Pain       Pain Assessment Pain Assessment: No/denies pain   Frequency  Min 2X/week  Progress Toward Goals  OT Goals(current goals can now be found in the care plan section)  Progress towards OT goals: Progressing toward goals  Acute Rehab OT Goals Patient Stated Goal: to go home OT Goal Formulation: With patient Time For Goal Achievement: 10/30/21 Potential to Achieve Goals: Fair ADL Goals Pt Will Transfer to Toilet: with min assist;stand pivot transfer;bedside commode Pt Will Perform Toileting - Clothing Manipulation and hygiene: with min assist;sitting/lateral leans Pt Will Perform Tub/Shower Transfer: with min assist;shower seat;grab bars  Plan Frequency remains appropriate;Discharge plan needs to be updated    Co-evaluation     PT/OT/SLP Co-Evaluation/Treatment: Yes Reason for Co-Treatment: For patient/therapist safety;To address functional/ADL transfers PT goals addressed during session: Mobility/safety with mobility;Balance OT goals addressed during session: ADL's and self-care      AM-PAC OT "6 Clicks" Daily Activity     Outcome Measure   Help from another person eating meals?: None Help from another person taking care of personal grooming?: A Little Help from another person toileting, which includes using toliet, bedpan, or urinal?: A Little Help from another person bathing (including washing, rinsing, drying)?: A Little Help from another person to put on and taking off regular upper body clothing?: A Little Help from another person to put on and taking off regular lower body clothing?: A Lot 6 Click Score: 18    End of Session Equipment Utilized During Treatment: Rolling walker (2 wheels)  OT Visit Diagnosis: Unsteadiness on feet (R26.81);Muscle weakness (generalized) (M62.81);History of falling (Z91.81)   Activity Tolerance Patient tolerated treatment well   Patient Left in chair;with call bell/phone within reach;with chair alarm set   Nurse Communication Mobility status        Time: 1740-8144 OT Time Calculation (min): 24 min  Charges: OT General Charges $OT Visit: 1 Visit OT Treatments $Self Care/Home Management : 8-22 mins  Dessie Coma, M.S. OTR/L  10/20/21, 3:18 PM  ascom [phone]

## 2021-10-20 NOTE — Progress Notes (Signed)
Physical Therapy Treatment Patient Details Name: Christopher [name] MRN: [id number] DOB: Dec 24, 1950 Today's Date: 10/20/2021   History of Present Illness Pt is a 71 y/o M with PMH: dCHF, depression, anxiety, COPD, HLD, morbid obesity, hypothyroidism, and sleep apnea (on cpap at night) who presented to ED d/t sliding out of his chair and being too weak to get back up. Pt adm for sepsis workup.    PT Comments    Pt received in bed agreeable to PT/OT co-treat. Clarification given from pt on baseline mobility. Pt endorses that he does not ambulate. Pt primarily using AD or hand support to transfer to <> from surfaces to w/c and mainly relies on w/c for mobility. Does at baseline asc/desc 3 stairs to enter/exit home with daughter and daughter's boyfriend available to assist 24/7. Pt able to transfer to EOB with bed features and ultimately stand step transfer to recliner with supervision and safe hand placement. Pt transferred to recliner with PT/OT and asc/desc 3 stairs with minguard. Pt able to safely perform with BUE support from hand rails and step to pattern without LOB or need for physical assist. Pt returned to recliner in room with OT. D/c recs changed as due to good family support and baseline mobility reported, pt is close to baseline not requiring STR at this time after safe stair completion and  safe transfers with BRW. TOC team aware of updates and pt in agreement.   Recommendations for follow up therapy are one component of a multi-disciplinary discharge planning process, led by the attending physician.  Recommendations may be updated based on patient status, additional functional criteria and insurance authorization.  Follow Up Recommendations  Home health PT     Assistance Recommended at Discharge Frequent or constant Supervision/Assistance  Patient can return home with the following A lot of help with bathing/dressing/bathroom;Direct supervision/assist for medications management;Direct  supervision/assist for financial management;A lot of help with walking and/or transfers;Assist for transportation;Help with stairs or ramp for entrance;Assistance with cooking/housework;A little help with bathing/dressing/bathroom;A little help with walking and/or transfers   Equipment Recommendations  None recommended by PT    Recommendations for Other Services       Precautions / Restrictions Precautions Precautions: Fall Restrictions Weight Bearing Restrictions: No     Mobility  Bed Mobility Overal bed mobility: Needs Assistance Bed Mobility: Supine to Sit     Supine to sit: Supervision, HOB elevated     General bed mobility comments: Increased time to complete. Patient Response: Cooperative  Transfers Overall transfer level: Needs assistance Equipment used: Rolling walker (2 wheels) Transfers: Sit to/from Stand, Bed to chair/wheelchair/BSC Sit to Stand: Min guard   Step pivot transfers: Supervision       General transfer comment: Able to stand with safe hand placement and bed elevated approximately yo height of bed at pt's home.    Ambulation/Gait   Gait Distance (Feet): 2 Feet Assistive device: Rolling walker (2 wheels) Gait Pattern/deviations: Step-to pattern, Wide base of support       General Gait Details: L ankle externally rotated due to previous injury, increased use of BUE support from RW   Stairs Stairs: Yes Stairs assistance: Min guard Stair Management: Two rails, Step to pattern, Backwards, Forwards Number of Stairs: 3 General stair comments: Asc stairs forwards and desc backwards. Increased time to perform without difficulty and no LOB. Requires heavy UE use on railings.   Wheelchair Mobility    Modified Rankin (Stroke Patients Only)       Balance Overall balance  assessment: Needs assistance Sitting-balance support: Bilateral upper extremity supported, Feet supported Sitting balance-Leahy Scale: Fair     Standing balance support:  During functional activity, Bilateral upper extremity supported Standing balance-Leahy Scale: Fair Standing balance comment: heavy reliance on RW for stability                            Cognition Arousal/Alertness: Awake/alert   Overall Cognitive Status: Within Functional Limits for tasks assessed                                          Exercises Other Exercises Other Exercises: change in d/c recs to home. Stairs education and training.    General Comments        Pertinent Vitals/Pain Pain Assessment Pain Assessment: No/denies pain    Home Living                          Prior Function            PT Goals (current goals can now be found in the care plan section) Acute Rehab PT Goals Patient Stated Goal: to get better PT Goal Formulation: With patient Time For Goal Achievement: 10/31/21 Potential to Achieve Goals: Good Progress towards PT goals: Progressing toward goals    Frequency    Min 2X/week      PT Plan Discharge plan needs to be updated    Co-evaluation PT/OT/SLP Co-Evaluation/Treatment: Yes Reason for Co-Treatment: For patient/therapist safety;To address functional/ADL transfers PT goals addressed during session: Mobility/safety with mobility OT goals addressed during session: ADL's and self-care      AM-PAC PT "6 Clicks" Mobility   Outcome Measure  Help needed turning from your back to your side while in a flat bed without using bedrails?: A Lot Help needed moving from lying on your back to sitting on the side of a flat bed without using bedrails?: A Lot Help needed moving to and from a bed to a chair (including a wheelchair)?: A Little Help needed standing up from a chair using your arms (e.g., wheelchair or bedside chair)?: A Little Help needed to walk in hospital room?: A Lot Help needed climbing 3-5 steps with a railing? : A Little 6 Click Score: 15    End of Session Equipment Utilized During  Treatment: Gait belt Activity Tolerance: Patient tolerated treatment well Patient left: in chair;with call bell/phone within reach;with chair alarm set (OT in room post session) Nurse Communication: Mobility status PT Visit Diagnosis: Unsteadiness on feet (R26.81);Muscle weakness (generalized) (M62.81);Difficulty in walking, not elsewhere classified (R26.2)     Time: 8119-1478 PT Time Calculation (min) (ACUTE ONLY): 16 min  Charges:  $Gait Training: 8-22 mins                     Salem Caster. Fairly IV, PT, DPT Physical Therapist- [facility]  10/20/2021, 2:37 PM

## 2021-10-20 NOTE — TOC Progression Note (Addendum)
Transition of Care [facility]) - Progression Note    Patient Details  Name: Teagan Heidrick MRN: [id number] Date of Birth: [birth year]  Transition of [facility]) CM/SW [place], RN Phone Number: 10/20/2021, 9:46 AM  Clinical Narrative:   Spoke to son, notified him that we have no bed offers.  Extended bed search to some [place] facilities.  Son asked for [facility], will fax information.   1334 hours addendum:Daughter contacted RNCM, stated that she would like patient to go home, but if he is unable to climb stairs, his family would not be able to accommodate him.  Message left for therapy to discuss, no SNF bed offers currently      Expected Discharge Plan and Services           Expected Discharge Date: 10/18/21                                     Social Determinants of Health (SDOH) Interventions    Readmission Risk Interventions Readmission Risk Prevention Plan 04/26/2021  Transportation Screening Complete  PCP or Specialist Appt within 3-5 Days Complete  HRI or [place] Complete  Social Work Consult for [place] Planning/Counseling Complete  Palliative Care Screening Not Applicable  Medication Review [employer]) Complete

## 2021-10-20 NOTE — Progress Notes (Signed)
Patient being discharged home. PIVs removed. Went over discharge instructions with patient and daughter, all questions answered. Patient going home POV with daughter.

## 2021-10-21 LAB — CULTURE, BLOOD (ROUTINE X 2)
Culture: NO GROWTH
Special Requests: ADEQUATE

## 2021-10-22 LAB — CULTURE, BLOOD (ROUTINE X 2)
Culture: NO GROWTH
Special Requests: ADEQUATE

## 2021-10-29 LAB — METHYLMALONIC ACID, SERUM: Methylmalonic Acid, Quantitative: 432 nmol/L — ABNORMAL HIGH (ref 0–378)

## 2021-12-10 ENCOUNTER — Encounter: Payer: Self-pay | Admitting: Family

## 2021-12-10 ENCOUNTER — Ambulatory Visit: Payer: Medicare Other | Attending: Family | Admitting: Family

## 2021-12-10 VITALS — BP 114/73 | HR 93 | Resp 18 | Ht 72.0 in | Wt 306.0 lb

## 2021-12-10 DIAGNOSIS — Z79899 Other long term (current) drug therapy: Secondary | ICD-10-CM | POA: Diagnosis not present

## 2021-12-10 DIAGNOSIS — F32A Depression, unspecified: Secondary | ICD-10-CM | POA: Insufficient documentation

## 2021-12-10 DIAGNOSIS — E785 Hyperlipidemia, unspecified: Secondary | ICD-10-CM | POA: Insufficient documentation

## 2021-12-10 DIAGNOSIS — R296 Repeated falls: Secondary | ICD-10-CM | POA: Insufficient documentation

## 2021-12-10 DIAGNOSIS — E039 Hypothyroidism, unspecified: Secondary | ICD-10-CM | POA: Diagnosis not present

## 2021-12-10 DIAGNOSIS — I110 Hypertensive heart disease with heart failure: Secondary | ICD-10-CM | POA: Insufficient documentation

## 2021-12-10 DIAGNOSIS — I10 Essential (primary) hypertension: Secondary | ICD-10-CM

## 2021-12-10 DIAGNOSIS — I890 Lymphedema, not elsewhere classified: Secondary | ICD-10-CM | POA: Diagnosis not present

## 2021-12-10 DIAGNOSIS — G4733 Obstructive sleep apnea (adult) (pediatric): Secondary | ICD-10-CM | POA: Diagnosis not present

## 2021-12-10 DIAGNOSIS — I5032 Chronic diastolic (congestive) heart failure: Secondary | ICD-10-CM | POA: Insufficient documentation

## 2021-12-10 DIAGNOSIS — Z9981 Dependence on supplemental oxygen: Secondary | ICD-10-CM | POA: Insufficient documentation

## 2021-12-10 DIAGNOSIS — R251 Tremor, unspecified: Secondary | ICD-10-CM | POA: Insufficient documentation

## 2021-12-10 DIAGNOSIS — J449 Chronic obstructive pulmonary disease, unspecified: Secondary | ICD-10-CM | POA: Diagnosis not present

## 2021-12-10 NOTE — Progress Notes (Signed)
? Patient ID: Christopher [name], male    DOB: 1951-05-02, 71 y.o.   MRN: [id number] ? ?HPI ? ?Christopher [name] is a 71 y/o male with a history of HTN, thyroid disease, anxiety, COPD, depression, hyperlipidemia, sleep apnea and chronic heart failure.  ? ?Echo report from 04/21/21 reviewed and showed and EF of 60-65% along with mild Christopher.  ? ?Admitted 10/15/21 due to generalized weakness due to severe sepsis of unknown origin. Given IV tylenol, fluids and antibiotics. PT/OT evaluations done. Antibiotic started for bronchitis. Torsemide held initially but then able to be resumed. Discharged after 5 days.  ? ?He presents today for a follow-up visit with a chief complaint of minimal fatigue upon moderate exertion. Describes this as chronic in nature having been present for several years. He has associated decreased appetite, shortness of breath, tremors, pedal edema, knee pain and depression along with this. He denies any difficulty sleeping, dizziness, abdominal distention, palpitations, chest pain, cough or weight gain.  ? ?Was tried on propranolol for his hand tremors but he then developed hallucinations so it was stopped. He is now taking OTC magnesium but hand tremors continue.  ? ?Past Medical History:  ?Diagnosis Date  ? Anxiety   ? CHF (congestive heart failure) ([place])   ? COPD (chronic obstructive pulmonary disease) ([place])   ? Depression   ? Dysrhythmia   ? Hyperlipidemia   ? Hypertension   ? Hypothyroidism   ? Sleep apnea   ? ?Past Surgical History:  ?Procedure Laterality Date  ? FRACTURE SURGERY    ? ORIF ANKLE FRACTURE Left 12/17/2020  ? Procedure: OPEN REDUCTION INTERNAL FIXATION (ORIF) ANKLE FRACTURE;  Surgeon: Samara Deist, DPM;  Location: [facility] ORS;  Service: Podiatry;  Laterality: Left;  ? ?Family History  ?Problem Relation Age of Onset  ? Hypertension Mother   ? ?Social History  ? ?Tobacco Use  ? Smoking status: Never  ? Smokeless tobacco: Never  ?Substance Use Topics  ? Alcohol use: Not Currently  ? ?Allergies   ?Allergen Reactions  ? Albuterol Other (See Comments)  ?  Causes afib   ? Gramineae Pollens Other (See Comments)  ?  Sneeze and watery eyes ?Sneeze and watery eyes  ? Other Other (See Comments)  ?  Other reaction(s): Other (See Comments) ?Unable to breath when around cats ?Other reaction(s): Other (See Comments) ?Unable to breath when around cats  ? ?Prior to Admission medications   ?Medication Sig Start Date End Date Taking? Authorizing Provider  ?acetaminophen (TYLENOL) 325 MG tablet Take 2 tablets (650 mg total) by mouth every 6 (six) hours as needed for mild pain, fever, headache or moderate pain. 05/23/21  Yes Wieting, Richard, MD  ?aspirin 81 MG EC tablet Take 81 mg by mouth at bedtime.   Yes [provider]  ?atorvastatin (LIPITOR) 20 MG tablet Take 20 mg by mouth at bedtime. 10/06/20  Yes [provider]  ?buPROPion (WELLBUTRIN SR) 150 MG 12 hr tablet Take 150 mg by mouth 2 (two) times daily. After 1st meal of day and after dinner 12/02/20  Yes [provider]  ?Cholecalciferol 25 MCG (1000 UT) capsule Take 1,000 Units by mouth at bedtime.   Yes [provider]  ?cyanocobalamin 1000 MCG tablet Take 1,000 mcg by mouth 2 (two) times a week. At bedtime. Sundays and Wednesdays   Yes [provider]  ?FLOVENT HFA 110 MCG/ACT inhaler Inhale 1 puff into the lungs 2 (two) times daily. 07/29/21  Yes [provider]  ?hydrocortisone cream 1 %  Apply topically as needed for itching. 05/25/21  Yes Wieting, Richard, MD  ?ipratropium (ATROVENT) 0.06 % nasal spray Place 2 sprays into both nostrils 3 (three) times daily as needed for rhinitis. 10/07/21  Yes [provider]  ?levalbuterol Penne Lash HFA) 45 MCG/ACT inhaler Inhale 2 puffs into the lungs every 4 (four) hours as needed for wheezing.   Yes [provider]  ?levalbuterol (XOPENEX) 0.63 MG/3ML nebulizer solution Take 3 mLs (0.63 mg total) by nebulization every 6 (six) hours as needed for wheezing or  shortness of breath. 03/24/21  Yes Fritzi Mandes, MD  ?levothyroxine (SYNTHROID) 88 MCG tablet Take 88 mcg by mouth daily. 30 to 60 minutes before breakfast on an empty stomach and with a glass of water 12/02/20  Yes [provider]  ?loratadine (CLARITIN) 10 MG tablet Take 10 mg by mouth daily.   Yes [provider]  ?Magnesium 250 MG TABS Take 1 tablet by mouth daily.   Yes [provider]  ?midodrine (PROAMATINE) 10 MG tablet Take 1 tablet (10 mg total) by mouth 3 (three) times daily with meals. 05/23/21  Yes Wieting, Richard, MD  ?Multiple Vitamins-Minerals (PRESERVISION AREDS 2) CAPS Take 1 capsule by mouth 2 (two) times daily. With food. After 1st meal of day and after dinner   Yes [provider]  ?OXYGEN Inhale 2 L into the lungs continuous.   Yes [provider]  ?potassium chloride SA (KLOR-CON) 20 MEQ tablet Take 1 tablet (20 mEq total) by mouth daily. 05/24/21  Yes Wieting, Richard, MD  ?torsemide (DEMADEX) 20 MG tablet Take 1 tablet (20 mg total) by mouth daily. 05/24/21  Yes Loletha Grayer, MD  ?traMADol (ULTRAM) 50 MG tablet Take 50 mg by mouth every 12 (twelve) hours as needed for moderate pain.   Yes [provider]  ?trolamine salicylate (ASPERCREME) 10 % cream Apply 1 application topically as needed for muscle pain. Apply to knees and feet   Yes [provider]  ?Vitamins A & D (VITAMIN A & D) ointment Apply 1 application topically as needed. To groin area.   Yes [provider]  ? ? ?Review of Systems  ?Constitutional:  Positive for appetite change (decreased) and fatigue.  ?HENT:  Negative for congestion, postnasal drip and sore throat.   ?Eyes: Negative.   ?Respiratory:  Positive for shortness of breath. Negative for cough and chest tightness.   ?Cardiovascular:  Positive for leg swelling. Negative for chest pain and palpitations.  ?Gastrointestinal:  Negative for abdominal distention and abdominal pain.  ?Endocrine: Negative.    ?Genitourinary: Negative.   ?Musculoskeletal:  Positive for arthralgias (both knees hurt). Negative for back pain and neck pain.  ?Skin: Negative.   ?Allergic/Immunologic: Negative.   ?Neurological:  Positive for tremors. Negative for dizziness and light-headedness.  ?Hematological:  Negative for adenopathy. Does not bruise/bleed easily.  ?Psychiatric/Behavioral:  Positive for dysphoric mood. Negative for sleep disturbance (sleeping on 1 pillow; not wearing CPAP nightly). The patient is not nervous/anxious.   ? ?Vitals:  ? 12/10/21 1408  ?BP: 114/73  ?Pulse: 93  ?Resp: 18  ?SpO2: 97%  ?Weight: (!) 306 lb (138.8 kg)  ?Height: 6' (1.829 m)  ? ?Wt Readings from Last 3 Encounters:  ?12/10/21 (!) 306 lb (138.8 kg)  ?10/19/21 (!) 330 lb 11 oz (150 kg)  ?08/11/21 (!) 328 lb 4 oz (148.9 kg)  ? ?Lab Results  ?Component Value Date  ? CREATININE 1.50 (H) 10/18/2021  ? CREATININE 1.56 (H) 10/17/2021  ? CREATININE  1.76 (H) 10/16/2021  ? ?Physical Exam ?Vitals and nursing note reviewed. Exam conducted with a chaperone present (daughter).  ?Constitutional:   ?   Appearance: Normal appearance.  ?HENT:  ?   Head: Normocephalic and atraumatic.  ?Cardiovascular:  ?   Rate and Rhythm: Normal rate and regular rhythm.  ?Pulmonary:  ?   Effort: Pulmonary effort is normal. No respiratory distress.  ?   Breath sounds: No wheezing or rales.  ?Abdominal:  ?   General: There is no distension.  ?   Palpations: Abdomen is soft.  ?Musculoskeletal:     ?   General: No tenderness.  ?   Cervical back: Normal range of motion and neck supple.  ?   Right lower leg: Edema (1+ pitting) present.  ?   Left lower leg: Edema (1+ pitting) present.  ?Skin: ?   General: Skin is warm and dry.  ?Neurological:  ?   General: No focal deficit present.  ?   Mental Status: He is alert and oriented to person, place, and time.  ?   Comments: Hand tremors  ?Psychiatric:     ?   Mood and Affect: Mood normal.     ?   Behavior: Behavior normal.     ?   Thought Content:  Thought content normal.  ? ?Assessment & Plan: ? ?1: Chronic heart failure with preserved ejection fraction without structural changes- ?- NYHA class II ?- euvolemic today ?- weighing daily; reminded to call fo

## 2021-12-10 NOTE — Patient Instructions (Addendum)
Continue weighing daily and call for an overnight weight gain of 3 pounds or more or a weekly weight gain of more than 5 pounds. ? ? ?If you have voicemail, please make sure your mailbox is cleaned out so that we may leave a message and please make sure to listen to any voicemails.  ? ? ?Return or call for any questions in the future ?

## 2022-01-26 DIAGNOSIS — N184 Chronic kidney disease, stage 4 (severe): Secondary | ICD-10-CM | POA: Insufficient documentation

## 2022-01-30 IMAGING — CT CT HEAD W/O CM
3 series · 15 of 47 positions shown, 18 images · non-contrast
Comparison: No priors.

CLINICAL DATA: 69-year-old male with history of mental status
change.

EXAM:
CT HEAD WITHOUT CONTRAST
TECHNIQUE: Contiguous axial images were obtained from the base of the skull
through the vertex without intravenous contrast.

[Series 2: head wo · axial · 0.48mm/px · z∈[-245,-90]mm · 9 of 37 slices shown, 12 images]
[im 3/37  brain]
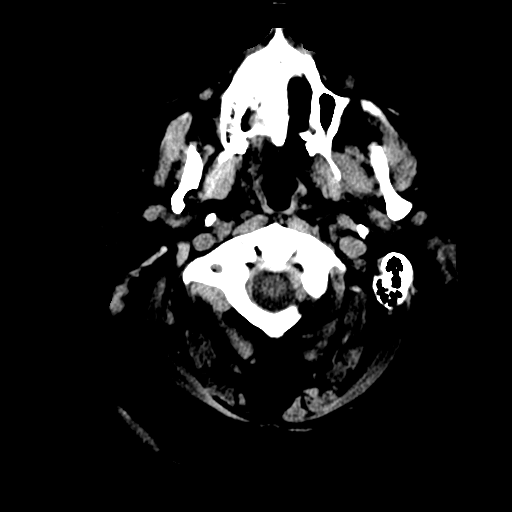
[im 3/37  bone]
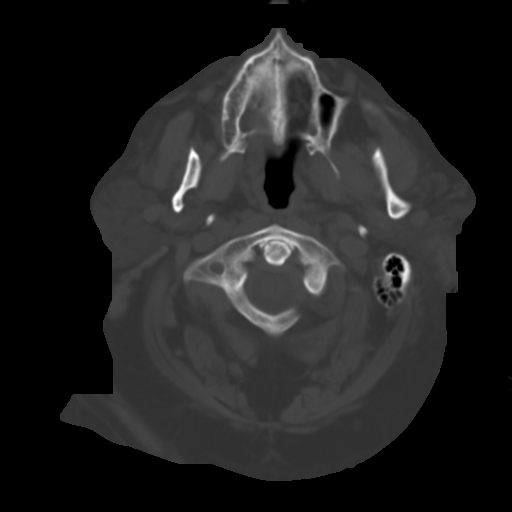
[im 7/37  brain]
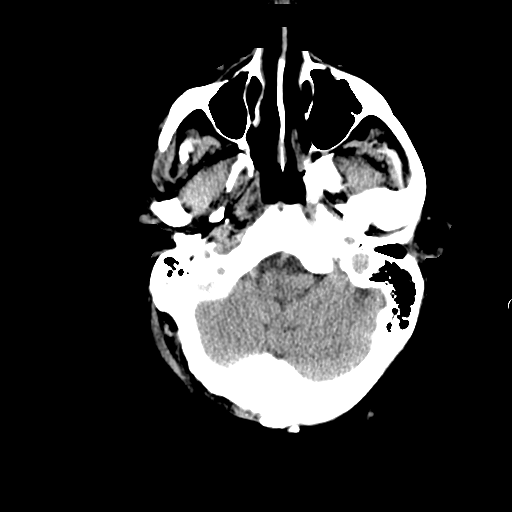
[im 10/37  brain]
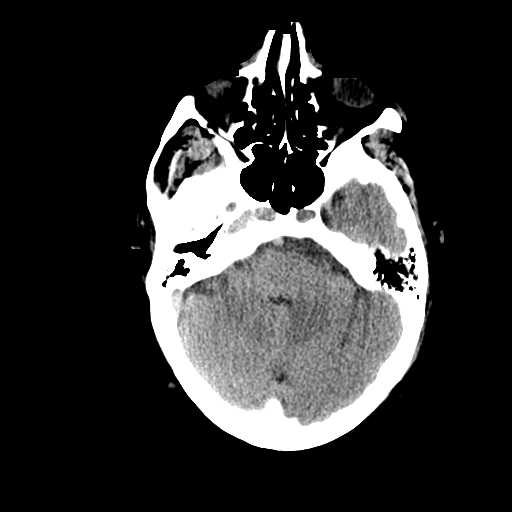
[im 14/37  brain]
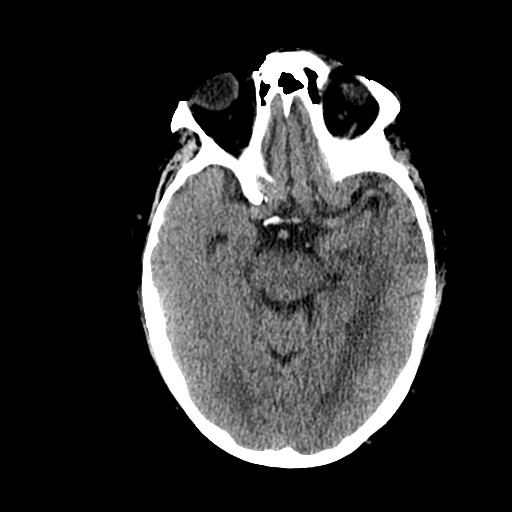
[im 19/37  brain]
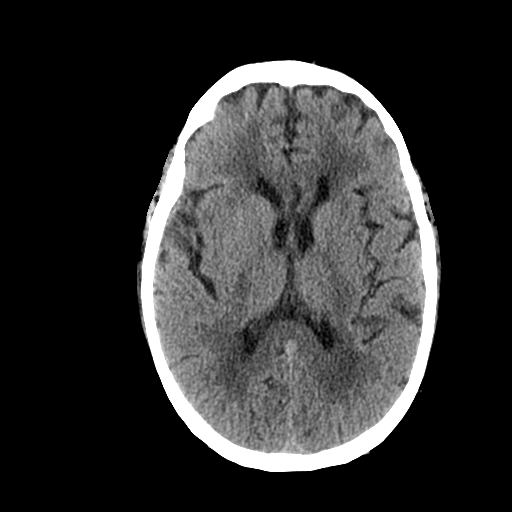
[im 19/37  bone]
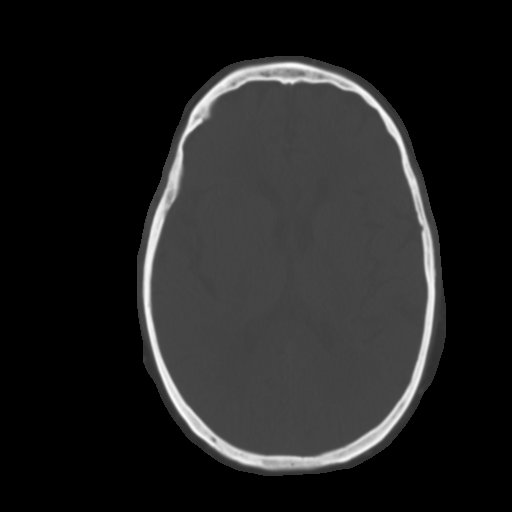
[im 23/37  brain]
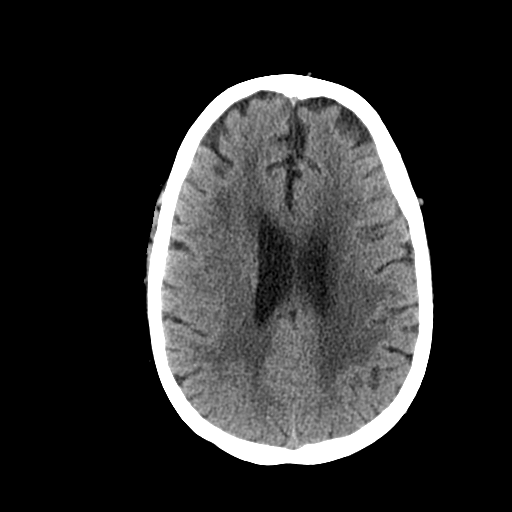
[im 27/37  brain]
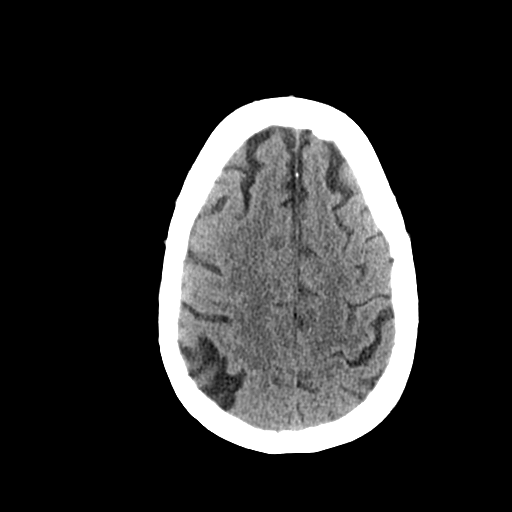
[im 30/37  brain]
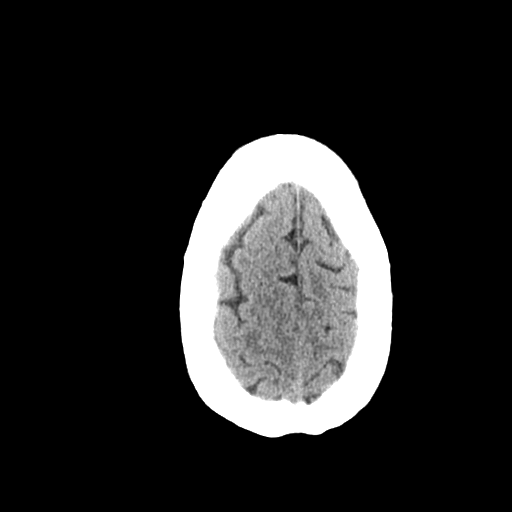
[im 34/37  brain]
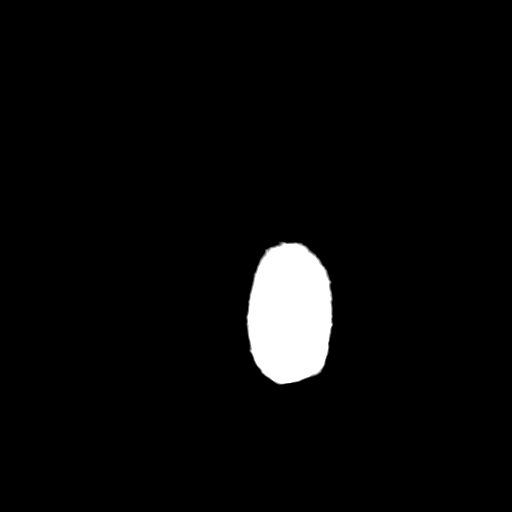
[im 34/37  bone]
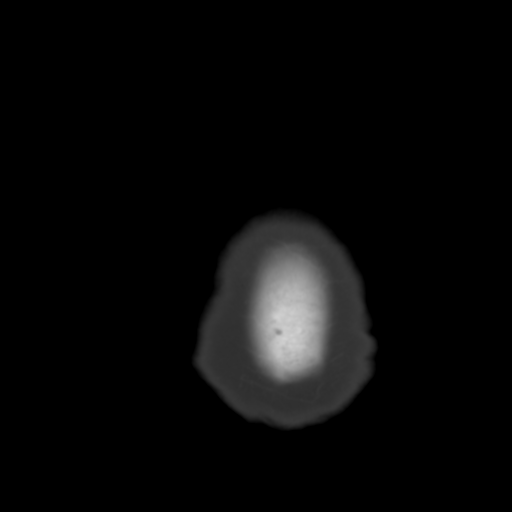

[Series 4: coronal soft tissue · coronal · 0.35mm/px · 3 of 84 slices shown]
[im 28/84  brain]
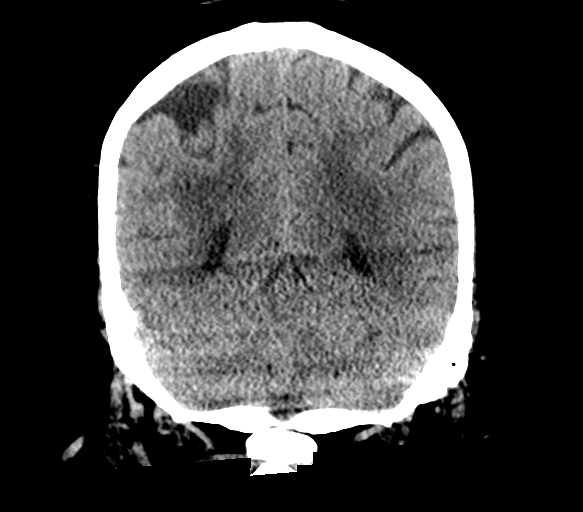
[im 37/84  brain]
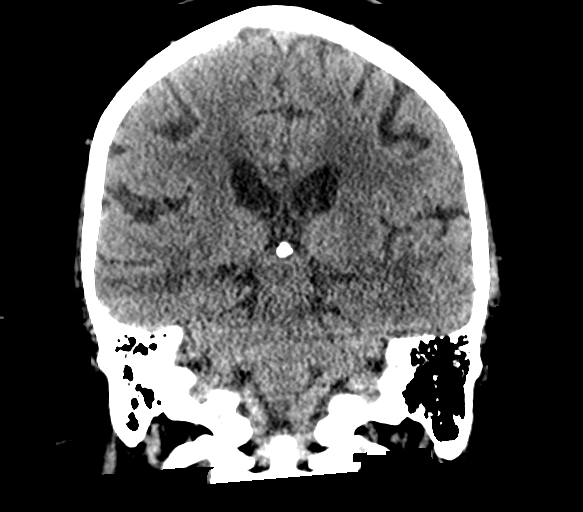
[im 47/84  brain]
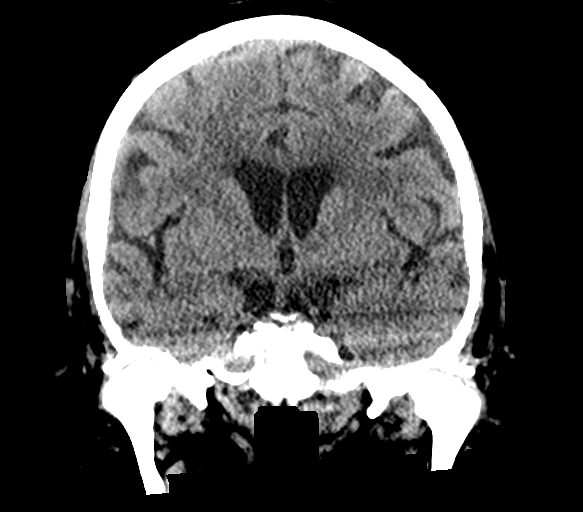

[Series 5: sagittal soft tissue · sagittal · 0.35mm/px · 3 of 69 slices shown]
[im 23/69  brain]
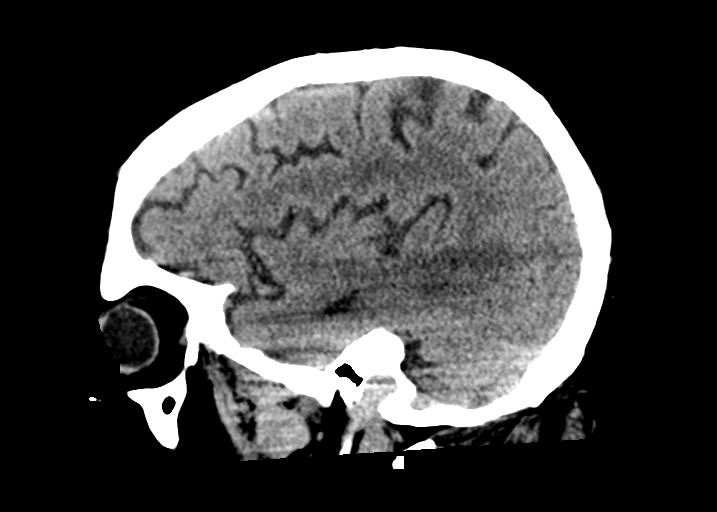
[im 35/69  brain]
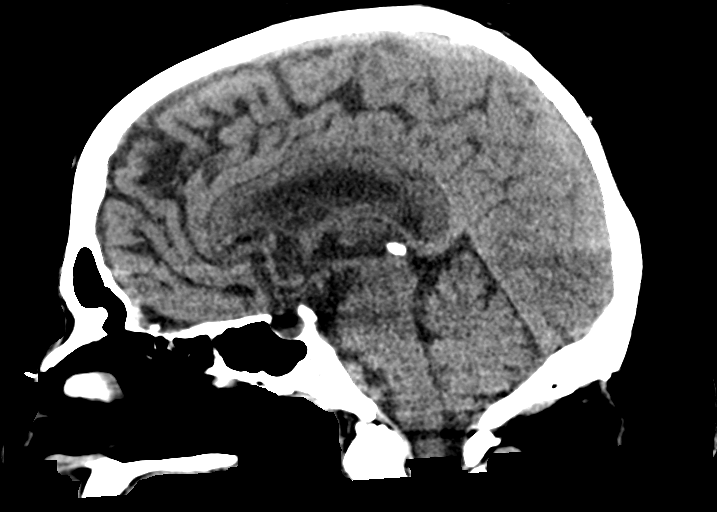
[im 46/69  brain]
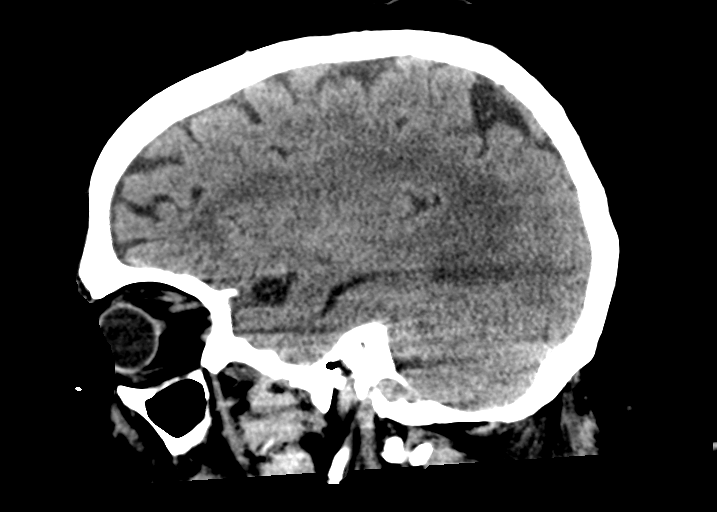

[15 of 47 positions shown; findings below may reference images not displayed]

FINDINGS: Brain: Mild cerebral atrophy. Patchy and confluent areas of
decreased attenuation are noted throughout the deep and
periventricular white matter of the cerebral hemispheres
bilaterally, compatible with chronic microvascular ischemic disease.
No evidence of acute infarction, hemorrhage, hydrocephalus,
extra-axial collection or mass lesion/mass effect.

Vascular: No hyperdense vessel or unexpected calcification.

Skull: Normal. Negative for fracture or focal lesion.

Sinuses/Orbits: No acute finding.

Other: Small right mastoid effusion.
IMPRESSION: 1. No acute intracranial abnormalities.
2. Small right mastoid effusion.
3. Mild cerebral atrophy with extensive chronic microvascular
ischemic changes in the cerebral white matter, as above.

## 2022-03-22 ENCOUNTER — Encounter: Payer: Self-pay | Admitting: Physical Therapy

## 2022-03-22 ENCOUNTER — Ambulatory Visit: Payer: Medicare Other | Attending: Internal Medicine | Admitting: Physical Therapy

## 2022-03-22 DIAGNOSIS — R262 Difficulty in walking, not elsewhere classified: Secondary | ICD-10-CM | POA: Diagnosis present

## 2022-03-22 DIAGNOSIS — R2689 Other abnormalities of gait and mobility: Secondary | ICD-10-CM | POA: Insufficient documentation

## 2022-03-22 DIAGNOSIS — M6281 Muscle weakness (generalized): Secondary | ICD-10-CM | POA: Diagnosis present

## 2022-03-22 DIAGNOSIS — R269 Unspecified abnormalities of gait and mobility: Secondary | ICD-10-CM | POA: Diagnosis present

## 2022-03-22 NOTE — Therapy (Signed)
OUTPATIENT PHYSICAL THERAPY NEURO EVALUATION   Patient Name: Christopher [name] MRN: [id number] DOB:Sep 02, 1950, 71 y.o., male Today's Date: 03/22/2022   PCP: Rusty Aus, MD REFERRING PROVIDER: Rusty Aus, MD   PT End of Session - 03/22/22 1441     Visit Number 1    Number of Visits 24    Date for PT Re-Evaluation 06/14/22    Progress Note Due on Visit 10    PT Start Time 7341    PT Stop Time 1515    PT Time Calculation (min) 38 min    Equipment Utilized During Treatment Gait belt    Activity Tolerance Patient tolerated treatment well    Behavior During Therapy WFL for tasks assessed/performed             Past Medical History:  Diagnosis Date   Anxiety    CHF (congestive heart failure) (HCC)    COPD (chronic obstructive pulmonary disease) ([place])    Depression    Dysrhythmia    Hyperlipidemia    Hypertension    Hypothyroidism    Sleep apnea    Past Surgical History:  Procedure Laterality Date   FRACTURE SURGERY     ORIF ANKLE FRACTURE Left 12/17/2020   Procedure: OPEN REDUCTION INTERNAL FIXATION (ORIF) ANKLE FRACTURE;  Surgeon: Samara Deist, DPM;  Location: [facility] ORS;  Service: Podiatry;  Laterality: Left;   Patient Active Problem List   Diagnosis Date Noted   Generalized weakness 10/16/2021   Chronic respiratory failure with hypoxia (HCC) 10/16/2021   Hypotension    SIRS (systemic inflammatory response syndrome) (HCC)    AF (paroxysmal atrial fibrillation) ([place])    Syncope    Severe sepsis ([place]) 05/20/2021   Pressure injury of skin 04/25/2021   Acute respiratory failure ([place]) 04/21/2021   Acute kidney injury superimposed on CKD ([place]) 04/21/2021   Stage 3b chronic kidney disease (CKD) ([place]) 03/22/2021   Leukocytosis 03/22/2021   Osteomyelitis of ankle or foot, acute, left ([place]) 03/17/2021   Acute on chronic respiratory failure ([place]) 03/17/2021   Osteomyelitis ([place]) 12/16/2020   Depression 12/16/2020   Medicare annual wellness visit, initial  11/14/2019   B12 deficiency 11/14/2019   Hyperlipidemia 10/31/2019   Primary osteoarthritis of right knee 03/16/2019   Sleep apnea 10/31/2018   Traumatic incomplete tear of left rotator cuff 10/02/2018   Morbid obesity with BMI of 45.0-49.9, adult ([place]) 09/20/2018   Hypothyroidism 09/20/2018   Chronic diastolic CHF (congestive heart failure) ([place]) 09/20/2018   Benign essential hypertension 09/20/2018    ONSET DATE: 10/16/20  REFERRING DIAG: R27.0 (ICD-10-CM) - Ataxia, unspecified  THERAPY DIAG:  Abnormality of gait and mobility  Difficulty in walking, not elsewhere classified  Muscle weakness (generalized)  Other abnormalities of gait and mobility  Rationale for Evaluation and Treatment Rehabilitation  SUBJECTIVE:  SUBJECTIVE STATEMENT: Patient reports independence with most activities prior to initial injury of his ankle.  Pt reports he broke his left ankle in February 2022. Following this he had hospital admission and was recommended to have therapy on his ankle, however, pt caregiver reports he never was able to get back up to par with where he was before. Later he went to hospital and he was admitted to [facility] for rehab where he stayed for 3 months followed by home health rehab where he was getting rehab up until going to [facility] for a few visits prior to scheduling appointment with our clinic.  Pt is getting Gel injection this moth on the right side for right knee pain as he states he is bone on bone.  Patient has had significant functional decline and is eager to return to higher levels of ambulation with improved balance responses.  Patient is also experiencing some signs of depression which she is currently on medication for secondary to his wife passing away this year.  As  result patient's motivation for completing physical therapy activities in the home outside of regular physical therapy visits has been limited.  Pt reports his stamina has been significantly impacted and he has a big fear for falling.   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  Patient was educated regarding potential for having occupational therapy care and was implored to consider this between now and his next therapy session.  Pt accompanied by: family member DTR  PERTINENT HISTORY: Patient reports independence with most activities prior to initial injury of his ankle.  Pt reports he broke his left ankle in February 2022. Following this he had hospital admission and was recommended to have therapy on his ankle, however, pt caregiver reports he never was able to get back up to par with where he was before. Later he went to hospital and he was admitted to [facility] for rehab where he stayed for 3 months followed by home health rehab where he was getting rehab up until going to [facility] for a few visits prior to scheduling appointment with our clinic.  Pt is getting Gel injection this moth on the right side for right knee pain as he states he is bone on bone.  Patient has had significant functional decline and is eager to return to higher levels of ambulation with improved balance responses.  Patient is also experiencing some signs of depression which she is currently on medication for secondary to his wife passing away this year.  As result patient's motivation for completing physical therapy activities in the home outside of regular physical therapy visits has been limited.  Pt reports his stamina has been significantly impacted and he has a big fear for falling.   PAIN:  Are you having pain? No  PRECAUTIONS: Fall  WEIGHT BEARING RESTRICTIONS No  FALLS: Has patient fallen in last 6 months? Yes. Number of falls 2, once in shower  and again in the bathroom.   LIVING ENVIRONMENT: Lives with: lives with their family Lives in: House/apartment Stairs: Yes: External: 1 steps; none 4 steps in garage with bilateral rails that are difficulty to grip.  Has following equipment at home: Gilford Rile - 2 wheeled, Environmental consultant - 4 wheeled, and shower chair  PLOF: Independent with household mobility with device, Independent with homemaking with ambulation, Independent with gait, and Leisure: making rings out of coins   PATIENT GOALS Improve ability  to cook, be able to access farmer's market independently. Be able to go into restaurants.   OBJECTIVE:   DIAGNOSTIC FINDINGS: N/A  COGNITION: Overall cognitive status: Within functional limits for tasks assessed   SENSATION: Not tested        LOWER EXTREMITY ROM:   WNL   Active  Right Eval Left Eval  Hip flexion    Hip extension    Hip abduction    Hip adduction    Hip internal rotation    Hip external rotation    Knee flexion    Knee extension    Ankle dorsiflexion    Ankle plantarflexion    Ankle inversion    Ankle eversion     (Blank rows = not tested)  LOWER EXTREMITY MMT:    MMT Right Eval Left Eval  Hip flexion 4+ 4+  Hip extension    Hip abduction 4+ 4+  Hip adduction 4+ 4+  Hip internal rotation    Hip external rotation    Knee flexion 4+ 4+  Knee extension 4 4  Ankle dorsiflexion 4 4  Ankle plantarflexion 4+ 3+  Ankle inversion    Ankle eversion    (Blank rows = not tested)  BED MOBILITY:  Sleeps in recliner   TRANSFERS: Assistive device utilized: Environmental consultant - 2 wheeled  Sit to stand: Min A utilizes walker contact-guard assist and chair arm.  Daughter reports she frequently holds walker to prevent him from pulling it over with Stand to sit: Modified independence Chair to chair: CGA  STANDING BALANCE: standing endurance x 50 seconds no UE, difficulty with maintaining knee extension  GAIT:  Gait pattern: decreased step length- Right, Left,  left ankle eversion/ hip external rotation  Distance walked: 20 feet Assistive device utilized: Walker - 2 wheeled Level of assistance: CGA Comments: Significant fatigue with assistance of ambulation.  Patient was very slow with turnaround as evidenced by 76.68 seconds timed up and go test.  Followed with wheelchair via daughter.  FUNCTIONAL TESTs:  5 times sit to stand: 18.77, significant UE support from walker and on chairs  TUG:76.68 sec   PATIENT SURVEYS:  FOTO 47, risk adjusted goal of 57  TODAY'S TREATMENT:  Eval only    PATIENT EDUCATION: Education details: POC   Person educated: Patient, DTR Education method: Explanation Education comprehension: verbalized understanding   HOME EXERCISE PROGRAM: To establish next session, seated due to standing fear of falling.     GOALS: Goals reviewed with patient? Yes  SHORT TERM GOALS: Target date: 04/19/2022     Patient will be independent in home exercise program to improve strength/mobility for better functional independence with ADLs. Baseline: No HEP currently  Goal status: INITIAL  LONG TERM GOALS: Target date: 06/14/2022 1.  Patient (> 21 years old) will complete five times sit to stand test in < 15 without assistance from walker for standing balance seconds indicating an increased LE strength and improved balance. Baseline: 18.77 with significant UE use and with walker for balance  Goal status: INITIAL  2.  Patient will increase FOTO score to equal to or greater than  57   to demonstrate statistically significant improvement in mobility and quality of life.  Baseline: 47 Goal status: INITIAL   3.   Patient will reduce timed up and go to <11 seconds to reduce fall risk and demonstrate improved transfer/gait ability. Baseline: 76.68 Goal status: INITIAL  4.   Patient will increase 10 meter walk test to >.20m/s as to improve gait  speed for better community ambulation and to reduce fall risk. Baseline: Test visit  2  Goal status: INITIAL 5.  Pt will stand independently without UE assist and without LOB for 3 min in order to indicate improved standing endurance and LE strength for cooking and meal prep activities .  Baseline: 50 seconds  Goal status: INITIAL       ASSESSMENT:  CLINICAL IMPRESSION: Patient is a 71  y.o. male who was seen today for physical therapy evaluation and treatment for balance issues and LE weakness.  Patient has significant deconditioning following hospitalization as well as several doctors of rehabilitation including skilled nursing facility as well as home health physical therapy.  Patient is at significant risk factor for falls based on timed up and go testing, history of recent falls, and 5 times sit to stand test results.  Patient to be tested with 10 m walk test next session due to fatigue was not tested this date.  Patient also to begin home exercise program starting next session and also to discuss at length ways to successfully adapt home exercise program into part of daily routine.  Patient will benefit from skilled physical therapy interventions in order to improve his balance, gait, mobility, improve his ability to access his environment, and improve his overall quality of life.   OBJECTIVE IMPAIRMENTS Abnormal gait, decreased activity tolerance, decreased balance, decreased mobility, difficulty walking, decreased strength, impaired perceived functional ability, and impaired flexibility.   ACTIVITY LIMITATIONS bending, standing, squatting, stairs, transfers, and locomotion level  PARTICIPATION LIMITATIONS: meal prep, cleaning, laundry, medication management, driving, and community activity  PERSONAL FACTORS Age and 3+ comorbidities: CHF, COPD, HLD, HTN  are also affecting patient's functional outcome.   REHAB POTENTIAL: Fair very deconditioned and has had significant therapy to this point. Also his motivation for HEPcompletion is low.   CLINICAL DECISION MAKING:  Evolving/moderate complexity  EVALUATION COMPLEXITY: Moderate  PLAN: PT FREQUENCY: 2x/week  PT DURATION: 12 weeks  PLANNED INTERVENTIONS: Therapeutic exercises, Therapeutic activity, Neuromuscular re-education, Balance training, Gait training, Patient/Family education, Self Care, Joint mobilization, Dry Needling, and Manual therapy  PLAN FOR NEXT SESSION: HEP, begin POC, 10MWT.    Particia Lather, PT 03/22/2022, 4:33 PM

## 2022-03-26 ENCOUNTER — Ambulatory Visit: Payer: Medicare Other | Attending: Internal Medicine | Admitting: Physical Therapy

## 2022-03-26 ENCOUNTER — Encounter: Payer: Self-pay | Admitting: Physical Therapy

## 2022-03-26 DIAGNOSIS — R262 Difficulty in walking, not elsewhere classified: Secondary | ICD-10-CM | POA: Diagnosis present

## 2022-03-26 DIAGNOSIS — R269 Unspecified abnormalities of gait and mobility: Secondary | ICD-10-CM | POA: Insufficient documentation

## 2022-03-26 DIAGNOSIS — M6281 Muscle weakness (generalized): Secondary | ICD-10-CM | POA: Diagnosis present

## 2022-03-26 DIAGNOSIS — R2689 Other abnormalities of gait and mobility: Secondary | ICD-10-CM | POA: Diagnosis present

## 2022-03-26 NOTE — Therapy (Addendum)
OUTPATIENT PHYSICAL THERAPY NEURO TREATMENT   Patient Name: Christopher [name] MRN: [id number] DOB:10/18/1950, 71 y.o., male Today's Date: 03/26/2022   PCP: Rusty Aus, MD REFERRING PROVIDER: Rusty Aus, MD   PT End of Session - 03/26/22 [phone]     Visit Number 2    Number of Visits 24    Date for PT Re-Evaluation 06/14/22    Progress Note Due on Visit 10    PT Start Time 0848    PT Stop Time 0928    PT Time Calculation (min) 40 min    Equipment Utilized During Treatment Gait belt    Activity Tolerance Patient tolerated treatment well    Behavior During Therapy WFL for tasks assessed/performed             Past Medical History:  Diagnosis Date   Anxiety    CHF (congestive heart failure) (HCC)    COPD (chronic obstructive pulmonary disease) ([place])    Depression    Dysrhythmia    Hyperlipidemia    Hypertension    Hypothyroidism    Sleep apnea    Past Surgical History:  Procedure Laterality Date   FRACTURE SURGERY     ORIF ANKLE FRACTURE Left 12/17/2020   Procedure: OPEN REDUCTION INTERNAL FIXATION (ORIF) ANKLE FRACTURE;  Surgeon: Samara Deist, DPM;  Location: [facility] ORS;  Service: Podiatry;  Laterality: Left;   Patient Active Problem List   Diagnosis Date Noted   Generalized weakness 10/16/2021   Chronic respiratory failure with hypoxia (HCC) 10/16/2021   Hypotension    SIRS (systemic inflammatory response syndrome) (HCC)    AF (paroxysmal atrial fibrillation) ([place])    Syncope    Severe sepsis ([place]) 05/20/2021   Pressure injury of skin 04/25/2021   Acute respiratory failure ([place]) 04/21/2021   Acute kidney injury superimposed on CKD ([place]) 04/21/2021   Stage 3b chronic kidney disease (CKD) ([place]) 03/22/2021   Leukocytosis 03/22/2021   Osteomyelitis of ankle or foot, acute, left ([place]) 03/17/2021   Acute on chronic respiratory failure ([place]) 03/17/2021   Osteomyelitis ([place]) 12/16/2020   Depression 12/16/2020   Medicare annual wellness visit, initial 11/14/2019    B12 deficiency 11/14/2019   Hyperlipidemia 10/31/2019   Primary osteoarthritis of right knee 03/16/2019   Sleep apnea 10/31/2018   Traumatic incomplete tear of left rotator cuff 10/02/2018   Morbid obesity with BMI of 45.0-49.9, adult ([place]) 09/20/2018   Hypothyroidism 09/20/2018   Chronic diastolic CHF (congestive heart failure) ([place]) 09/20/2018   Benign essential hypertension 09/20/2018    ONSET DATE: 10/16/20  REFERRING DIAG: R27.0 (ICD-10-CM) - Ataxia, unspecified  THERAPY DIAG:  Abnormality of gait and mobility  Difficulty in walking, not elsewhere classified  Muscle weakness (generalized)  Other abnormalities of gait and mobility  Rationale for Evaluation and Treatment Rehabilitation  SUBJECTIVE:  SUBJECTIVE STATEMENT: Pt reports no significant changes since initial eval. Is not experiencing any significant pain at thie time.  Pt accompanied by: family member DTR  PERTINENT HISTORY: Patient reports independence with most activities prior to initial injury of his ankle.  Pt reports he broke his left ankle in February 2022. Following this he had hospital admission and was recommended to have therapy on his ankle, however, pt caregiver reports he never was able to get back up to par with where he was before. Later he went to hospital and he was admitted to [facility] for rehab where he stayed for 3 months followed by home health rehab where he was getting rehab up until going to [facility] for a few visits prior to scheduling appointment with our clinic.  Pt is getting Gel injection this moth on the right side for right knee pain as he states he is bone on bone.  Patient has had significant functional decline and is eager to return to higher levels of ambulation with improved balance  responses.  Patient is also experiencing some signs of depression which she is currently on medication for secondary to his wife passing away this year.  As result patient's motivation for completing physical therapy activities in the home outside of regular physical therapy visits has been limited.  Pt reports his stamina has been significantly impacted and he has a big fear for falling.   PAIN:  Are you having pain? No  PRECAUTIONS: Fall  WEIGHT BEARING RESTRICTIONS No  FALLS: Has patient fallen in last 6 months? Yes. Number of falls 2, once in shower and again in the bathroom.   LIVING ENVIRONMENT: Lives with: lives with their family Lives in: House/apartment Stairs: Yes: External: 1 steps; none 4 steps in garage with bilateral rails that are difficulty to grip.  Has following equipment at home: Gilford Rile - 2 wheeled, Environmental consultant - 4 wheeled, and shower chair  PLOF: Independent with household mobility with device, Independent with homemaking with ambulation, Independent with gait, and Leisure: making rings out of coins   PATIENT GOALS Improve ability to cook, be able to access farmer's market independently. Be able to go into restaurants.   OBJECTIVE:   DIAGNOSTIC FINDINGS: N/A  COGNITION: Overall cognitive status: Within functional limits for tasks assessed   SENSATION: Not tested        LOWER EXTREMITY ROM:   WNL   Active  Right Eval Left Eval  Hip flexion    Hip extension    Hip abduction    Hip adduction    Hip internal rotation    Hip external rotation    Knee flexion    Knee extension    Ankle dorsiflexion    Ankle plantarflexion    Ankle inversion    Ankle eversion     (Blank rows = not tested)  LOWER EXTREMITY MMT:    MMT Right Eval Left Eval  Hip flexion 4+ 4+  Hip extension    Hip abduction 4+ 4+  Hip adduction 4+ 4+  Hip internal rotation    Hip external rotation    Knee flexion 4+ 4+  Knee extension 4 4  Ankle dorsiflexion 4 4  Ankle  plantarflexion 4+ 3+  Ankle inversion    Ankle eversion    (Blank rows = not tested)  BED MOBILITY:  Sleeps in recliner   TRANSFERS: Assistive device utilized: Environmental consultant - 2 wheeled  Sit to stand: Min A utilizes walker contact-guard assist and chair arm.  Daughter reports she frequently holds walker to prevent him from pulling it over with Stand to sit: Modified independence Chair to chair: CGA  STANDING BALANCE: standing endurance x 50 seconds no UE, difficulty with maintaining knee extension  GAIT:  Gait pattern: decreased step length- Right, Left, left ankle eversion/ hip external rotation  Distance walked: 20 feet Assistive device utilized: Walker - 2 wheeled Level of assistance: CGA Comments: Significant fatigue with assistance of ambulation.  Patient was very slow with turnaround as evidenced by 76.68 seconds timed up and go test.  Followed with wheelchair via daughter.  FUNCTIONAL TESTs:  5 times sit to stand: 18.77, significant UE support from walker and on chairs  TUG:76.68 sec  8/4: 10 m walk test performed with wheelchair follow and bariatric walker provided by patient.  Patient completes and 21.37 seconds in a 0.47 m/s speed. -No Loss of balance noted with significant fatigue following  PATIENT SURVEYS:  FOTO 47, risk adjusted goal of 57  TODAY'S TREATMENT: 03/26/22  Completed the following to initiate and ensure proper performance of HEP Access Code: [email or web address] URL: [email or web address] Date: 03/26/2022 Prepared by: Rivka Barbara  Exercises - Seated March  - 1 x daily - 7 x weekly - 2 sets - 10 reps - Seated Long Arc Quad  - 1 x daily - 7 x weekly - 2 sets - 10 reps - Seated single leg step to side   - 1 x daily - 7 x weekly - 2 sets - 10 reps - Sit to Stand with Counter Support  - 1 x daily - 7 x weekly - 1 sets - 5 reps  -cues for pushing on chair and not pulling on walker, good and safe performance.  SPO2% 99 following      -Standing  marching with upper extremity support.  2 sets of 5 reps on each lower extremity patient has significant fatigue with this activity and had a prolonged seated rest break following   Pt required occasional rest breaks due fatigue, PT was quick to ask when pt appeared to be fatiguing in order to prevent excessive fatigue.   PATIENT EDUCATION: Education details: POC   Person educated: Patient, DTR Education method: Explanation Education comprehension: verbalized understanding   HOME EXERCISE PROGRAM: Access Code: [email or web address] URL: [email or web address] Date: 03/26/2022 Prepared by: Rivka Barbara  Exercises - Seated March  - 1 x daily - 7 x weekly - 2 sets - 10 reps - Seated Long Arc Quad  - 1 x daily - 7 x weekly - 2 sets - 10 reps - Seated single leg step to side   - 1 x daily - 7 x weekly - 2 sets - 10 reps - Sit to Stand with Counter Support  - 1 x daily - 7 x weekly - 1 sets - 5 reps  GOALS Goals reviewed with patient? Yes  SHORT TERM GOALS: Target date: 04/19/2022     Patient will be independent in home exercise program to improve strength/mobility for better functional independence with ADLs. Baseline: No HEP currently  Goal status: INITIAL  LONG TERM GOALS: Target date: 06/14/2022 1.  Patient (> 2 years old) will complete five times sit to stand test in < 15 without assistance from walker for standing balance seconds indicating an increased LE strength and improved balance. Baseline: 18.77 with significant UE use and with walker for balance  Goal status: INITIAL  2.  Patient will increase FOTO score to equal to or greater than  57  to demonstrate statistically significant improvement in mobility and quality of life.  Baseline: 47 Goal status: INITIAL   3.   Patient will reduce timed up and go to <11 seconds to reduce fall risk and demonstrate improved transfer/gait ability. Baseline: 76.68 Goal status: INITIAL  4.   Patient will increase 10 meter  walk test to >.33m/s as to improve gait speed for better community ambulation and to reduce fall risk. Baseline: 8/4: 21.36 sec (.78m/s) Goal status: INITIAL 5.  Pt will stand independently without UE assist and without LOB for 3 min in order to indicate improved standing endurance and LE strength for cooking and meal prep activities .  Baseline: 50 seconds  Goal status: INITIAL       ASSESSMENT:  CLINICAL IMPRESSION: Pt remains motivated to advance progress toward goals in order to maximize independence and safety at home.  Patient instructed in importance of completing home exercises for adequate progression with physical therapy.  Pt requires high level assistance and cuing for completion of exercises in order to provide adequate level of stimulation challenge while minimizing pain and discomfort when possible. Pt closely monitored throughout session pt response and to maximize patient safety during interventions. Pt continues to demonstrate progress toward goals AEB progression of interventions this date either in volume or intensity.  Patient is used to home exercise program while in seated position in order to improve his efficacy at home and was encouraged to complete as part of normal daily routine.  Patient will need continued motivation for completion of home exercises.  Patient's daughter is also very involved in patient's care and she will serve as a motivator for home exercise program as well.  Pt will continue to benefit from skilled physical therapy intervention to address impairments, improve QOL, and attain therapy goals.      OBJECTIVE IMPAIRMENTS Abnormal gait, decreased activity tolerance, decreased balance, decreased mobility, difficulty walking, decreased strength, impaired perceived functional ability, and impaired flexibility.   ACTIVITY LIMITATIONS bending, standing, squatting, stairs, transfers, and locomotion level  PARTICIPATION LIMITATIONS: meal prep, cleaning,  laundry, medication management, driving, and community activity  PERSONAL FACTORS Age and 3+ comorbidities: CHF, COPD, HLD, HTN  are also affecting patient's functional outcome.   REHAB POTENTIAL: Fair very deconditioned and has had significant therapy to this point. Also his motivation for HEPcompletion is low.   CLINICAL DECISION MAKING: Evolving/moderate complexity  EVALUATION COMPLEXITY: Moderate  PLAN: PT FREQUENCY: 2x/week  PT DURATION: 12 weeks  PLANNED INTERVENTIONS: Therapeutic exercises, Therapeutic activity, Neuromuscular re-education, Balance training, Gait training, Patient/Family education, Self Care, Joint mobilization, Dry Needling, and Manual therapy  PLAN FOR NEXT SESSION: Continue plan of care endurance activities including NuStep, lower extremity strengthening mixture of seated and standing as standing exercises results significant fatigue.   Particia Lather, PT 03/26/2022, 11:12 AM

## 2022-03-31 ENCOUNTER — Ambulatory Visit: Payer: Medicare Other

## 2022-03-31 DIAGNOSIS — R269 Unspecified abnormalities of gait and mobility: Secondary | ICD-10-CM | POA: Diagnosis not present

## 2022-03-31 DIAGNOSIS — R2689 Other abnormalities of gait and mobility: Secondary | ICD-10-CM

## 2022-03-31 DIAGNOSIS — R262 Difficulty in walking, not elsewhere classified: Secondary | ICD-10-CM

## 2022-03-31 DIAGNOSIS — M6281 Muscle weakness (generalized): Secondary | ICD-10-CM

## 2022-03-31 NOTE — Therapy (Signed)
OUTPATIENT PHYSICAL THERAPY NEURO TREATMENT   Patient Name: Christopher [name] MRN: [id number] DOB:[birth year], 71 y.o., male Today's Date: 03/31/2022   PCP: Rusty Aus, MD REFERRING PROVIDER: Rusty Aus, MD   PT End of Session - 03/31/22 1114     Visit Number 3    Number of Visits 24    Date for PT Re-Evaluation 06/14/22    Progress Note Due on Visit 10    PT Start Time 1110    PT Stop Time 1145    PT Time Calculation (min) 35 min    Equipment Utilized During Treatment Gait belt    Activity Tolerance Patient tolerated treatment well    Behavior During Therapy WFL for tasks assessed/performed             Past Medical History:  Diagnosis Date   Anxiety    CHF (congestive heart failure) (HCC)    COPD (chronic obstructive pulmonary disease) ([place])    Depression    Dysrhythmia    Hyperlipidemia    Hypertension    Hypothyroidism    Sleep apnea    Past Surgical History:  Procedure Laterality Date   FRACTURE SURGERY     ORIF ANKLE FRACTURE Left 12/17/2020   Procedure: OPEN REDUCTION INTERNAL FIXATION (ORIF) ANKLE FRACTURE;  Surgeon: Samara Deist, DPM;  Location: [facility] ORS;  Service: Podiatry;  Laterality: Left;   Patient Active Problem List   Diagnosis Date Noted   Generalized weakness 10/16/2021   Chronic respiratory failure with hypoxia (HCC) 10/16/2021   Hypotension    SIRS (systemic inflammatory response syndrome) (HCC)    AF (paroxysmal atrial fibrillation) ([place])    Syncope    Severe sepsis ([place]) 05/20/2021   Pressure injury of skin 04/25/2021   Acute respiratory failure ([place]) 04/21/2021   Acute kidney injury superimposed on CKD ([place]) 04/21/2021   Stage 3b chronic kidney disease (CKD) ([place]) 03/22/2021   Leukocytosis 03/22/2021   Osteomyelitis of ankle or foot, acute, left ([place]) 03/17/2021   Acute on chronic respiratory failure ([place]) 03/17/2021   Osteomyelitis ([place]) 12/16/2020   Depression 12/16/2020   Medicare annual wellness visit, initial 11/14/2019    B12 deficiency 11/14/2019   Hyperlipidemia 10/31/2019   Primary osteoarthritis of right knee 03/16/2019   Sleep apnea 10/31/2018   Traumatic incomplete tear of left rotator cuff 10/02/2018   Morbid obesity with BMI of 45.0-49.9, adult ([place]) 09/20/2018   Hypothyroidism 09/20/2018   Chronic diastolic CHF (congestive heart failure) ([place]) 09/20/2018   Benign essential hypertension 09/20/2018    ONSET DATE: 10/16/20  REFERRING DIAG: R27.0 (ICD-10-CM) - Ataxia, unspecified  THERAPY DIAG:  Abnormality of gait and mobility  Difficulty in walking, not elsewhere classified  Muscle weakness (generalized)  Other abnormalities of gait and mobility  Rationale for Evaluation and Treatment Rehabilitation  SUBJECTIVE:  SUBJECTIVE STATEMENT: Pt reports he has tried his HEP. Apologizes for arriving late to session. Denies falls. [name] pt had a hard time trying to stand yesterday.   Pt accompanied by: family member DTR  PERTINENT HISTORY: Patient reports independence with most activities prior to initial injury of his ankle.  Pt reports he broke his left ankle in February 2022. Following this he had hospital admission and was recommended to have therapy on his ankle, however, pt caregiver reports he never was able to get back up to par with where he was before. Later he went to hospital and he was admitted to [facility] for rehab where he stayed for 3 months followed by home health rehab where he was getting rehab up until going to [facility] for a few visits prior to scheduling appointment with our clinic.  Pt is getting Gel injection this moth on the right side for right knee pain as he states he is bone on bone.  Patient has had significant functional decline and is eager to return to higher  levels of ambulation with improved balance responses.  Patient is also experiencing some signs of depression which she is currently on medication for secondary to his wife passing away this year.  As result patient's motivation for completing physical therapy activities in the home outside of regular physical therapy visits has been limited.  Pt reports his stamina has been significantly impacted and he has a big fear for falling.   PAIN:  Are you having pain? No  PRECAUTIONS: Fall  WEIGHT BEARING RESTRICTIONS No  FALLS: Has patient fallen in last 6 months? Yes. Number of falls 2, once in shower and again in the bathroom.   LIVING ENVIRONMENT: Lives with: lives with their family Lives in: House/apartment Stairs: Yes: External: 1 steps; none 4 steps in garage with bilateral rails that are difficulty to grip.  Has following equipment at home: Gilford Rile - 2 wheeled, Environmental consultant - 4 wheeled, and shower chair  PLOF: Independent with household mobility with device, Independent with homemaking with ambulation, Independent with gait, and Leisure: making rings out of coins   PATIENT GOALS Improve ability to cook, be able to access farmer's market independently. Be able to go into restaurants.   OBJECTIVE:   DIAGNOSTIC FINDINGS: N/A  COGNITION: Overall cognitive status: Within functional limits for tasks assessed   SENSATION: Not tested        LOWER EXTREMITY ROM:   WNL   Active  Right Eval Left Eval  Hip flexion    Hip extension    Hip abduction    Hip adduction    Hip internal rotation    Hip external rotation    Knee flexion    Knee extension    Ankle dorsiflexion    Ankle plantarflexion    Ankle inversion    Ankle eversion     (Blank rows = not tested)  LOWER EXTREMITY MMT:    MMT Right Eval Left Eval  Hip flexion 4+ 4+  Hip extension    Hip abduction 4+ 4+  Hip adduction 4+ 4+  Hip internal rotation    Hip external rotation    Knee flexion 4+ 4+  Knee extension  4 4  Ankle dorsiflexion 4 4  Ankle plantarflexion 4+ 3+  Ankle inversion    Ankle eversion    (Blank rows = not tested)  BED MOBILITY:  Sleeps in recliner   TRANSFERS: Assistive device utilized: Environmental consultant - 2 wheeled  Sit to stand:  Min A utilizes walker contact-guard assist and chair arm.  Daughter reports she frequently holds walker to prevent him from pulling it over with Stand to sit: Modified independence Chair to chair: CGA  STANDING BALANCE: standing endurance x 50 seconds no UE, difficulty with maintaining knee extension  GAIT:  Gait pattern: decreased step length- Right, Left, left ankle eversion/ hip external rotation  Distance walked: 20 feet Assistive device utilized: Walker - 2 wheeled Level of assistance: CGA Comments: Significant fatigue with assistance of ambulation.  Patient was very slow with turnaround as evidenced by 76.68 seconds timed up and go test.  Followed with wheelchair via daughter.  FUNCTIONAL TESTs:  5 times sit to stand: 18.77, significant UE support from walker and on chairs  TUG:76.68 sec  8/4: 10 m walk test performed with wheelchair follow and bariatric walker provided by patient.  Patient completes and 21.37 seconds in a 0.47 m/s speed. -No Loss of balance noted with significant fatigue following  PATIENT SURVEYS:  FOTO 47, risk adjusted goal of 57  TODAY'S TREATMENT: 03/31/22  There.ex:  Review of HEP which was established last session    Exercises - Seated March  - 1 x daily - 7 x weekly - 2 sets - 10 reps - Seated Long Arc Quad  - 1 x daily - 7 x weekly - 2 sets - 10 reps - Seated single leg step to side   - 1 x daily - 7 x weekly - 2 sets - 10 reps - Sit to Stand with Counter Support  - 1 x daily - 7 x weekly - 1 sets - 5 reps            -cues for pushing on chair and not pulling on walker, good and safe performance.   SPO2: 93%, HR: 107 BPM post HEP review. Pt requiring min VC's and mod TC's for form/technique with fair to good  carryover.      STS: additional 1x5 outside of HEP. CGA. Max HR up to 116 BPM on first set then 114 BPM on second set. Noted increased WOB that improves with seated rest.    Seated:   Heel raises: 2x15, min VC's for form/technique.  Clamshells with GTB: 2x15   Ambulation with BRW with w/c follow: 20'. CGA provided. Max HR 122 BPM.    Education provided on improving activity tolerance at home. Ambulating in home short household distances with daughter provided W/c follow with out relying on urinal to improve strength, independence and ability to safely ambulate.    Pt required occasional rest breaks due fatigue, PT was quick to ask when pt appeared to be fatiguing in order to prevent excessive fatigue.   PATIENT EDUCATION: Education details: form/technique with exercise. Optimizing function at home.  Person educated: Patient, DTR Education method: Explanation Education comprehension: verbalized understanding   HOME EXERCISE PROGRAM: Access Code: [email or web address] URL: [email or web address] Date: 03/26/2022 Prepared by: Rivka Barbara  Exercises - Seated March  - 1 x daily - 7 x weekly - 2 sets - 10 reps - Seated Long Arc Quad  - 1 x daily - 7 x weekly - 2 sets - 10 reps - Seated single leg step to side   - 1 x daily - 7 x weekly - 2 sets - 10 reps - Sit to Stand with Counter Support  - 1 x daily - 7 x weekly - 1 sets - 5 reps  GOALS Goals reviewed with patient? Yes  SHORT TERM GOALS: Target date:  04/19/2022     Patient will be independent in home exercise program to improve strength/mobility for better functional independence with ADLs. Baseline: No HEP currently  Goal status: INITIAL  LONG TERM GOALS: Target date: 06/14/2022 1.  Patient (> 82 years old) will complete five times sit to stand test in < 15 without assistance from walker for standing balance seconds indicating an increased LE strength and improved balance. Baseline: 18.77 with significant UE use  and with walker for balance  Goal status: INITIAL  2.  Patient will increase FOTO score to equal to or greater than  57   to demonstrate statistically significant improvement in mobility and quality of life.  Baseline: 47 Goal status: INITIAL   3.   Patient will reduce timed up and go to <11 seconds to reduce fall risk and demonstrate improved transfer/gait ability. Baseline: 76.68 Goal status: INITIAL  4.   Patient will increase 10 meter walk test to >.31m/s as to improve gait speed for better community ambulation and to reduce fall risk. Baseline: 8/4: 21.36 sec (.15m/s) Goal status: INITIAL 5.  Pt will stand independently without UE assist and without LOB for 3 min in order to indicate improved standing endurance and LE strength for cooking and meal prep activities .  Baseline: 50 seconds  Goal status: INITIAL       ASSESSMENT:  CLINICAL IMPRESSION: Continuing PT POC with review of HEP. Pt required frequent, mod multimodal cuing for seated exercise. Limited standing and ambulation tolerance due to LE faituge and increased WOB. HR elevates up to 122 BPM with ambulation 20' with w/c follow. Pt and daughter educated on benefits of beginning to ambulate short household distances with w/c follow via daughter to begin building up tolerance for functional mobility and LE strengthening. Pt will continue to benefit from skilled PT services to optimize function and independence with ADL's and reduced risk of falls.     OBJECTIVE IMPAIRMENTS Abnormal gait, decreased activity tolerance, decreased balance, decreased mobility, difficulty walking, decreased strength, impaired perceived functional ability, and impaired flexibility.   ACTIVITY LIMITATIONS bending, standing, squatting, stairs, transfers, and locomotion level  PARTICIPATION LIMITATIONS: meal prep, cleaning, laundry, medication management, driving, and community activity  PERSONAL FACTORS Age and 3+ comorbidities: CHF, COPD,  HLD, HTN  are also affecting patient's functional outcome.   REHAB POTENTIAL: Fair very deconditioned and has had significant therapy to this point. Also his motivation for HEPcompletion is low.   CLINICAL DECISION MAKING: Evolving/moderate complexity  EVALUATION COMPLEXITY: Moderate  PLAN: PT FREQUENCY: 2x/week  PT DURATION: 12 weeks  PLANNED INTERVENTIONS: Therapeutic exercises, Therapeutic activity, Neuromuscular re-education, Balance training, Gait training, Patient/Family education, Self Care, Joint mobilization, Dry Needling, and Manual therapy  PLAN FOR NEXT SESSION: Begin with standing and walking tasks due to LE weakness and fatigue   Salem Caster. Fairly IV, PT, DPT Physical Therapist- [facility]  03/31/2022, 12:07 PM

## 2022-04-02 ENCOUNTER — Ambulatory Visit: Payer: Medicare Other

## 2022-04-02 DIAGNOSIS — R269 Unspecified abnormalities of gait and mobility: Secondary | ICD-10-CM | POA: Diagnosis not present

## 2022-04-02 DIAGNOSIS — M6281 Muscle weakness (generalized): Secondary | ICD-10-CM

## 2022-04-02 DIAGNOSIS — R2689 Other abnormalities of gait and mobility: Secondary | ICD-10-CM

## 2022-04-02 DIAGNOSIS — R262 Difficulty in walking, not elsewhere classified: Secondary | ICD-10-CM

## 2022-04-02 NOTE — Therapy (Signed)
OUTPATIENT PHYSICAL THERAPY NEURO TREATMENT   Patient Name: Christopher [name] MRN: [id number] DOB:1951/03/24, 71 y.o., male Today's Date: 04/02/2022   PCP: Rusty Aus, MD REFERRING PROVIDER: Rusty Aus, MD   PT End of Session - 04/02/22 [phone]     Visit Number 4    Number of Visits 24    Date for PT Re-Evaluation 06/14/22    Progress Note Due on Visit 10    PT Start Time 0932    PT Stop Time 1012    PT Time Calculation (min) 40 min    Activity Tolerance Patient tolerated treatment well;No increased pain    Behavior During Therapy WFL for tasks assessed/performed             Past Medical History:  Diagnosis Date   Anxiety    CHF (congestive heart failure) (HCC)    COPD (chronic obstructive pulmonary disease) ([place])    Depression    Dysrhythmia    Hyperlipidemia    Hypertension    Hypothyroidism    Sleep apnea    Past Surgical History:  Procedure Laterality Date   FRACTURE SURGERY     ORIF ANKLE FRACTURE Left 12/17/2020   Procedure: OPEN REDUCTION INTERNAL FIXATION (ORIF) ANKLE FRACTURE;  Surgeon: Samara Deist, DPM;  Location: [facility] ORS;  Service: Podiatry;  Laterality: Left;   Patient Active Problem List   Diagnosis Date Noted   Generalized weakness 10/16/2021   Chronic respiratory failure with hypoxia (HCC) 10/16/2021   Hypotension    SIRS (systemic inflammatory response syndrome) (HCC)    AF (paroxysmal atrial fibrillation) ([place])    Syncope    Severe sepsis ([place]) 05/20/2021   Pressure injury of skin 04/25/2021   Acute respiratory failure ([place]) 04/21/2021   Acute kidney injury superimposed on CKD ([place]) 04/21/2021   Stage 3b chronic kidney disease (CKD) ([place]) 03/22/2021   Leukocytosis 03/22/2021   Osteomyelitis of ankle or foot, acute, left ([place]) 03/17/2021   Acute on chronic respiratory failure ([place]) 03/17/2021   Osteomyelitis ([place]) 12/16/2020   Depression 12/16/2020   Medicare annual wellness visit, initial 11/14/2019   B12 deficiency 11/14/2019    Hyperlipidemia 10/31/2019   Primary osteoarthritis of right knee 03/16/2019   Sleep apnea 10/31/2018   Traumatic incomplete tear of left rotator cuff 10/02/2018   Morbid obesity with BMI of 45.0-49.9, adult ([place]) 09/20/2018   Hypothyroidism 09/20/2018   Chronic diastolic CHF (congestive heart failure) ([place]) 09/20/2018   Benign essential hypertension 09/20/2018    ONSET DATE: 10/16/20  REFERRING DIAG: R27.0 (ICD-10-CM) - Ataxia, unspecified  THERAPY DIAG:  Abnormality of gait and mobility  Difficulty in walking, not elsewhere classified  Muscle weakness (generalized)  Other abnormalities of gait and mobility  Rationale for Evaluation and Treatment Rehabilitation  SUBJECTIVE:  SUBJECTIVE STATEMENT: Doing well today, no updates. Reports mild compliance with HEP, but without explanation. Still fearful of sustaining another fall at home.   Pt accompanied by: family member DTR  PERTINENT HISTORY: Patient reports independence with most activities prior to initial injury of his ankle.  Pt reports he broke his left ankle in February 2022. Following this he had hospital admission and was recommended to have therapy on his ankle, however, pt caregiver reports he never was able to get back up to par with where he was before. Later he went to hospital and he was admitted to [facility] for rehab where he stayed for 3 months followed by home health rehab where he was getting rehab up until going to [facility] for a few visits prior to scheduling appointment with our clinic.  Pt is getting Gel injection this moth on the right side for right knee pain as he states he is bone on bone.  Patient has had significant functional decline and is eager to return to higher levels of ambulation with improved  balance responses.  Patient is also experiencing some signs of depression which she is currently on medication for secondary to his wife passing away this year.  As result patient's motivation for completing physical therapy activities in the home outside of regular physical therapy visits has been limited.  Pt reports his stamina has been significantly impacted and he has a big fear for falling.   PAIN:  Are you having pain? No  PRECAUTIONS: Fall  WEIGHT BEARING RESTRICTIONS No  FALLS: Has patient fallen in last 6 months? Yes. Number of falls 2, once in shower and again in the bathroom.   LIVING ENVIRONMENT: Lives with: lives with their family Lives in: House/apartment Stairs: Yes: External: 1 steps; none 4 steps in garage with bilateral rails that are difficulty to grip.  Has following equipment at home: Gilford Rile - 2 wheeled, Environmental consultant - 4 wheeled, and shower chair  PLOF: Independent with household mobility with device, Independent with homemaking with ambulation, Independent with gait, and Leisure: making rings out of coins   PATIENT GOALS Improve ability to cook, be able to access farmer's market independently. Be able to go into restaurants.   OBJECTIVE:   DIAGNOSTIC FINDINGS: N/A  COGNITION: Overall cognitive status: Within functional limits for tasks assessed   SENSATION: Not tested  LOWER EXTREMITY ROM:   WNL     LOWER EXTREMITY MMT:    MMT Right Eval Left Eval  Hip flexion 4+ 4+  Hip extension    Hip abduction 4+ 4+  Hip adduction 4+ 4+  Hip internal rotation    Hip external rotation    Knee flexion 4+ 4+  Knee extension 4 4  Ankle dorsiflexion 4 4  Ankle plantarflexion 4+ 3+  Ankle inversion    Ankle eversion    (Blank rows = not tested)  BED MOBILITY:  Sleeps in recliner   TRANSFERS: Assistive device utilized: Environmental consultant - 2 wheeled  Sit to stand: Min A utilizes walker contact-guard assist and chair arm.  Daughter reports she frequently holds walker to  prevent him from pulling it over with Stand to sit: Modified independence Chair to chair: CGA  STANDING BALANCE: standing endurance x 50 seconds no UE, difficulty with maintaining knee extension  GAIT:  Gait pattern: decreased step length- Right, Left, left ankle eversion/ hip external rotation  Distance walked: 20 feet Assistive device utilized: Walker - 2 wheeled Level of assistance: CGA Comments: Significant fatigue with assistance  of ambulation.  Patient was very slow with turnaround as evidenced by 76.68 seconds timed up and go test.  Followed with wheelchair via daughter.  FUNCTIONAL TESTs:  5 times sit to stand: 18.77, significant UE support from walker and on chairs  TUG:76.68 sec  8/4: 10 m walk test performed with wheelchair follow and bariatric walker provided by patient.  Patient completes and 21.37 seconds in a 0.47 m/s speed. -No Loss of balance noted with significant fatigue following  PATIENT SURVEYS:  FOTO 47, risk adjusted goal of 57  TODAY'S TREATMENT: 04/02/22 STS from [facility], very labored, requires minA  STS from [facility] + airex 1x6 supervision c pt's BRW [place] in [place] 1x20 alternating STS from [place] + airex 1x6 [place] in [place] 1x30 alternating 3lb AW  STS to //bars from [facility] RT hand on arm rest (x3) 9ft FWD AMB i n// bars, then 61ft backward in bars (x2) hurts Right knee standing marching in // bars 30x alternating sides  Marching in WC x30 c 3lb AW  self propulsion from far end of // bars to cable tower  seated downward row cable 1x15 @ 17.5, 1x15 @ 22.5    Review of HEP which was established last session    Exercises - Seated March  - 1 x daily - 7 x weekly - 2 sets - 10 reps - Seated Long Arc Quad  - 1 x daily - 7 x weekly - 2 sets - 10 reps - Seated single leg step to side   - 1 x daily - 7 x weekly - 2 sets - 10 reps - Sit to Stand with Counter Support  - 1 x daily - 7 x weekly - 1 sets - 5 reps -cues for pushing on chair and not pulling on walker, good and  safe performance.   SPO2: 93%, HR: 107 BPM post HEP review. Pt requiring min VC's and mod TC's for form/technique with fair to good carryover   Education provided on improving activity tolerance at home. Ambulating in home short household distances with daughter provided W/c follow with out relying on urinal to improve strength, independence and ability to safely ambulate.    Pt required occasional rest breaks due fatigue, PT was quick to ask when pt appeared to be fatiguing in order to prevent excessive fatigue.   PATIENT EDUCATION: Education details: form/technique with exercise. Optimizing function at home.  Person educated: Patient, DTR Education method: Explanation Education comprehension: verbalized understanding   HOME EXERCISE PROGRAM: Access Code: [email or web address] URL: [email or web address] Date: 03/26/2022 Prepared by: Rivka Barbara  Exercises - Seated March  - 1 x daily - 7 x weekly - 2 sets - 10 reps - Seated Long Arc Quad  - 1 x daily - 7 x weekly - 2 sets - 10 reps - Seated single leg step to side   - 1 x daily - 7 x weekly - 2 sets - 10 reps - Sit to Stand with Counter Support  - 1 x daily - 7 x weekly - 1 sets - 5 reps  GOALS Goals reviewed with patient? Yes  SHORT TERM GOALS: Target date: 04/19/2022     Patient will be independent in home exercise program to improve strength/mobility for better functional independence with ADLs. Baseline: No HEP currently  Goal status: INITIAL  LONG TERM GOALS: Target date: 06/14/2022 1.  Patient (> 89 years old) will complete five times sit to stand test in < 15 without assistance from walker for standing balance seconds  indicating an increased LE strength and improved balance. Baseline: 18.77 with significant UE use and with walker for balance  Goal status: INITIAL  2.  Patient will increase FOTO score to equal to or greater than  57   to demonstrate statistically significant improvement in mobility and quality  of life.  Baseline: 47 Goal status: INITIAL   3.   Patient will reduce timed up and go to <11 seconds to reduce fall risk and demonstrate improved transfer/gait ability. Baseline: 76.68 Goal status: INITIAL  4.   Patient will increase 10 meter walk test to >.62m/s as to improve gait speed for better community ambulation and to reduce fall risk. Baseline: 8/4: 21.36 sec (.71m/s) Goal status: INITIAL 5.  Pt will stand independently without UE assist and without LOB for 3 min in order to indicate improved standing endurance and LE strength for cooking and meal prep activities .  Baseline: 50 seconds  Goal status: INITIAL       ASSESSMENT:  CLINICAL IMPRESSION: Lots of focus on transfer safety today, recommend cushion for WC at home to decrease effort required for basic transfers. Rt knee pain limiting in session.  Dt educated on cushion recs for home. Pt has standing tolerance of ~60sec today. Also encouraged pt to be sure to take medications, especially midodrine, prior to arrival, as well as eating some food if not a whole breakfast prior to session. Pt will continue to benefit from skilled PT services to optimize function and independence with ADL's and reduced risk of falls.     OBJECTIVE IMPAIRMENTS Abnormal gait, decreased activity tolerance, decreased balance, decreased mobility, difficulty walking, decreased strength, impaired perceived functional ability, and impaired flexibility.   ACTIVITY LIMITATIONS bending, standing, squatting, stairs, transfers, and locomotion level  PARTICIPATION LIMITATIONS: meal prep, cleaning, laundry, medication management, driving, and community activity  PERSONAL FACTORS Age and 3+ comorbidities: CHF, COPD, HLD, HTN  are also affecting patient's functional outcome.   REHAB POTENTIAL: Fair very deconditioned and has had significant therapy to this point. Also his motivation for HEPcompletion is low.   CLINICAL DECISION MAKING:  Evolving/moderate complexity  EVALUATION COMPLEXITY: Moderate  PLAN: PT FREQUENCY: 2x/week  PT DURATION: 12 weeks  PLANNED INTERVENTIONS: Therapeutic exercises, Therapeutic activity, Neuromuscular re-education, Balance training, Gait training, Patient/Family education, Self Care, Joint mobilization, Dry Needling, and Manual therapy  PLAN FOR NEXT SESSION: Begin with standing and walking tasks due to LE weakness and fatigue     04/02/2022, 10:22 AM  10:23 AM, 04/02/22 Etta Grandchild, PT, DPT Physical Therapist - [place] [phone]

## 2022-04-06 ENCOUNTER — Ambulatory Visit: Payer: Medicare Other | Admitting: Physical Therapy

## 2022-04-08 ENCOUNTER — Ambulatory Visit: Payer: Medicare Other

## 2022-04-08 DIAGNOSIS — R269 Unspecified abnormalities of gait and mobility: Secondary | ICD-10-CM | POA: Diagnosis not present

## 2022-04-08 DIAGNOSIS — M6281 Muscle weakness (generalized): Secondary | ICD-10-CM

## 2022-04-08 DIAGNOSIS — R262 Difficulty in walking, not elsewhere classified: Secondary | ICD-10-CM

## 2022-04-08 NOTE — Therapy (Signed)
OUTPATIENT PHYSICAL THERAPY NEURO TREATMENT   Patient Name: Christopher [name] MRN: [id number] DOB:04/07/1951, 71 y.o., male Today's Date: 04/08/2022   PCP: Rusty Aus, MD REFERRING PROVIDER: Rusty Aus, MD   PT End of Session - 04/08/22 1305     Visit Number 5    Number of Visits 24    Date for PT Re-Evaluation 06/14/22    Progress Note Due on Visit 10    PT Start Time 1306    PT Stop Time 1344    PT Time Calculation (min) 38 min    Activity Tolerance Patient tolerated treatment well;No increased pain    Behavior During Therapy WFL for tasks assessed/performed              Past Medical History:  Diagnosis Date   Anxiety    CHF (congestive heart failure) (HCC)    COPD (chronic obstructive pulmonary disease) ([place])    Depression    Dysrhythmia    Hyperlipidemia    Hypertension    Hypothyroidism    Sleep apnea    Past Surgical History:  Procedure Laterality Date   FRACTURE SURGERY     ORIF ANKLE FRACTURE Left 12/17/2020   Procedure: OPEN REDUCTION INTERNAL FIXATION (ORIF) ANKLE FRACTURE;  Surgeon: Samara Deist, DPM;  Location: [facility] ORS;  Service: Podiatry;  Laterality: Left;   Patient Active Problem List   Diagnosis Date Noted   Generalized weakness 10/16/2021   Chronic respiratory failure with hypoxia (HCC) 10/16/2021   Hypotension    SIRS (systemic inflammatory response syndrome) (HCC)    AF (paroxysmal atrial fibrillation) ([place])    Syncope    Severe sepsis ([place]) 05/20/2021   Pressure injury of skin 04/25/2021   Acute respiratory failure ([place]) 04/21/2021   Acute kidney injury superimposed on CKD ([place]) 04/21/2021   Stage 3b chronic kidney disease (CKD) ([place]) 03/22/2021   Leukocytosis 03/22/2021   Osteomyelitis of ankle or foot, acute, left ([place]) 03/17/2021   Acute on chronic respiratory failure ([place]) 03/17/2021   Osteomyelitis ([place]) 12/16/2020   Depression 12/16/2020   Medicare annual wellness visit, initial 11/14/2019   B12 deficiency 11/14/2019    Hyperlipidemia 10/31/2019   Primary osteoarthritis of right knee 03/16/2019   Sleep apnea 10/31/2018   Traumatic incomplete tear of left rotator cuff 10/02/2018   Morbid obesity with BMI of 45.0-49.9, adult ([place]) 09/20/2018   Hypothyroidism 09/20/2018   Chronic diastolic CHF (congestive heart failure) ([place]) 09/20/2018   Benign essential hypertension 09/20/2018    ONSET DATE: 10/16/20  REFERRING DIAG: R27.0 (ICD-10-CM) - Ataxia, unspecified  THERAPY DIAG:  Abnormality of gait and mobility  Difficulty in walking, not elsewhere classified  Muscle weakness (generalized)  Rationale for Evaluation and Treatment Rehabilitation  SUBJECTIVE:  SUBJECTIVE STATEMENT: Patient reports he was sore after last session. Accompanied by daughter, reports intermittent compliance with HEP. Not been walking in home.   Pt accompanied by: family member DTR  PERTINENT HISTORY: Patient reports independence with most activities prior to initial injury of his ankle.  Pt reports he broke his left ankle in February 2022. Following this he had hospital admission and was recommended to have therapy on his ankle, however, pt caregiver reports he never was able to get back up to par with where he was before. Later he went to hospital and he was admitted to [facility] for rehab where he stayed for 3 months followed by home health rehab where he was getting rehab up until going to [facility] for a few visits prior to scheduling appointment with our clinic.  Pt is getting Gel injection this moth on the right side for right knee pain as he states he is bone on bone.  Patient has had significant functional decline and is eager to return to higher levels of ambulation with improved balance responses.  Patient is also experiencing  some signs of depression which she is currently on medication for secondary to his wife passing away this year.  As result patient's motivation for completing physical therapy activities in the home outside of regular physical therapy visits has been limited.  Pt reports his stamina has been significantly impacted and he has a big fear for falling.   PAIN:  Are you having pain? No  PRECAUTIONS: Fall  WEIGHT BEARING RESTRICTIONS No  FALLS: Has patient fallen in last 6 months? Yes. Number of falls 2, once in shower and again in the bathroom.   LIVING ENVIRONMENT: Lives with: lives with their family Lives in: House/apartment Stairs: Yes: External: 1 steps; none 4 steps in garage with bilateral rails that are difficulty to grip.  Has following equipment at home: Gilford Rile - 2 wheeled, Environmental consultant - 4 wheeled, and shower chair  PLOF: Independent with household mobility with device, Independent with homemaking with ambulation, Independent with gait, and Leisure: making rings out of coins   PATIENT GOALS Improve ability to cook, be able to access farmer's market independently. Be able to go into restaurants.   OBJECTIVE:   DIAGNOSTIC FINDINGS: N/A  COGNITION: Overall cognitive status: Within functional limits for tasks assessed   SENSATION: Not tested        LOWER EXTREMITY ROM:   WNL   Active  Right Eval Left Eval  Hip flexion    Hip extension    Hip abduction    Hip adduction    Hip internal rotation    Hip external rotation    Knee flexion    Knee extension    Ankle dorsiflexion    Ankle plantarflexion    Ankle inversion    Ankle eversion     (Blank rows = not tested)  LOWER EXTREMITY MMT:    MMT Right Eval Left Eval  Hip flexion 4+ 4+  Hip extension    Hip abduction 4+ 4+  Hip adduction 4+ 4+  Hip internal rotation    Hip external rotation    Knee flexion 4+ 4+  Knee extension 4 4  Ankle dorsiflexion 4 4  Ankle plantarflexion 4+ 3+  Ankle inversion     Ankle eversion    (Blank rows = not tested)  BED MOBILITY:  Sleeps in recliner   TRANSFERS: Assistive device utilized: Environmental consultant - 2 wheeled  Sit to stand: Min A utilizes walker contact-guard  assist and chair arm.  Daughter reports she frequently holds walker to prevent him from pulling it over with Stand to sit: Modified independence Chair to chair: CGA  STANDING BALANCE: standing endurance x 50 seconds no UE, difficulty with maintaining knee extension  GAIT:  Gait pattern: decreased step length- Right, Left, left ankle eversion/ hip external rotation  Distance walked: 20 feet Assistive device utilized: Walker - 2 wheeled Level of assistance: CGA Comments: Significant fatigue with assistance of ambulation.  Patient was very slow with turnaround as evidenced by 76.68 seconds timed up and go test.  Followed with wheelchair via daughter.  FUNCTIONAL TESTs:  5 times sit to stand: 18.77, significant UE support from walker and on chairs  TUG:76.68 sec  8/4: 10 m walk test performed with wheelchair follow and bariatric walker provided by patient.  Patient completes and 21.37 seconds in a 0.47 m/s speed. -No Loss of balance noted with significant fatigue following  PATIENT SURVEYS:  FOTO 47, risk adjusted goal of 57  TODAY'S TREATMENT: 04/08/22  There.ex Standing in // bars:   Standing weight shifts with UE support 10x each side : HR 114  STS in // bars with hands on bars: HR 118.   Forward stepping 10x each LE heavy BUE support ; HR 108 Lateral stepping 10x each LE ; 109  Standing hand taps 8x no UE support on // bars 102  Standing with RW: no UE support 30 seconds; HR 105  Seated:  Heel raises: 2x15, min VC's for form/technique. Lateral hedgehog taps 10x each LE 6: step toe taps seated 30 seconds x2 trials HR to 99   BTB: -hamstring curl 15x each LE -lateral step out 15x each LE; x 2 trials 105 HR -clamshells 15x     Education provided on improving activity  tolerance at home. Ambulating in home short household distances with daughter provided W/c follow with out relying on urinal to improve strength, independence and ability to safely ambulate.    Pt required occasional rest breaks due fatigue, PT was quick to ask when pt appeared to be fatiguing in order to prevent excessive fatigue.   PATIENT EDUCATION: Education details: form/technique with exercise. Optimizing function at home.  Person educated: Patient, DTR Education method: Explanation Education comprehension: verbalized understanding   HOME EXERCISE PROGRAM: Access Code: [email or web address] URL: [email or web address] Date: 03/26/2022 Prepared by: Rivka Barbara  Exercises - Seated March  - 1 x daily - 7 x weekly - 2 sets - 10 reps - Seated Long Arc Quad  - 1 x daily - 7 x weekly - 2 sets - 10 reps - Seated single leg step to side   - 1 x daily - 7 x weekly - 2 sets - 10 reps - Sit to Stand with Counter Support  - 1 x daily - 7 x weekly - 1 sets - 5 reps  GOALS Goals reviewed with patient? Yes  SHORT TERM GOALS: Target date: 04/19/2022     Patient will be independent in home exercise program to improve strength/mobility for better functional independence with ADLs. Baseline: No HEP currently  Goal status: INITIAL  LONG TERM GOALS: Target date: 06/14/2022 1.  Patient (> 52 years old) will complete five times sit to stand test in < 15 without assistance from walker for standing balance seconds indicating an increased LE strength and improved balance. Baseline: 18.77 with significant UE use and with walker for balance  Goal status: INITIAL  2.  Patient will increase FOTO score to equal  to or greater than  57   to demonstrate statistically significant improvement in mobility and quality of life.  Baseline: 47 Goal status: INITIAL   3.   Patient will reduce timed up and go to <11 seconds to reduce fall risk and demonstrate improved transfer/gait ability. Baseline:  76.68 Goal status: INITIAL  4.   Patient will increase 10 meter walk test to >.78m/s as to improve gait speed for better community ambulation and to reduce fall risk. Baseline: 8/4: 21.36 sec (.104m/s) Goal status: INITIAL 5.  Pt will stand independently without UE assist and without LOB for 3 min in order to indicate improved standing endurance and LE strength for cooking and meal prep activities .  Baseline: 50 seconds  Goal status: INITIAL       ASSESSMENT:  CLINICAL IMPRESSION: Patient presents with excellent motivation. He is able to stand without UE support this session allowing for progression of stabilization interventions. Patient and patient's daughter encouraged to be compliant with HEP to maximize results from PT.  Pt will continue to benefit from skilled PT services to optimize function and independence with ADL's and reduced risk of falls.     OBJECTIVE IMPAIRMENTS Abnormal gait, decreased activity tolerance, decreased balance, decreased mobility, difficulty walking, decreased strength, impaired perceived functional ability, and impaired flexibility.   ACTIVITY LIMITATIONS bending, standing, squatting, stairs, transfers, and locomotion level  PARTICIPATION LIMITATIONS: meal prep, cleaning, laundry, medication management, driving, and community activity  PERSONAL FACTORS Age and 3+ comorbidities: CHF, COPD, HLD, HTN  are also affecting patient's functional outcome.   REHAB POTENTIAL: Fair very deconditioned and has had significant therapy to this point. Also his motivation for HEPcompletion is low.   CLINICAL DECISION MAKING: Evolving/moderate complexity  EVALUATION COMPLEXITY: Moderate  PLAN: PT FREQUENCY: 2x/week  PT DURATION: 12 weeks  PLANNED INTERVENTIONS: Therapeutic exercises, Therapeutic activity, Neuromuscular re-education, Balance training, Gait training, Patient/Family education, Self Care, Joint mobilization, Dry Needling, and Manual therapy  PLAN  FOR NEXT SESSION: Begin with standing and walking tasks due to LE weakness and fatigue   [facility]  04/08/2022, 1:47 PM

## 2022-04-12 ENCOUNTER — Telehealth: Payer: Self-pay | Admitting: Physical Therapy

## 2022-04-12 ENCOUNTER — Ambulatory Visit: Payer: Medicare Other | Admitting: Physical Therapy

## 2022-04-12 NOTE — Telephone Encounter (Signed)
Pt contacted via telephone and author left voice mail informing of missed appointment and informed pt of future PT appointment date and time.   Braeden Kennan PT, DPT   

## 2022-04-16 ENCOUNTER — Ambulatory Visit: Payer: Medicare Other | Admitting: Physical Therapy

## 2022-04-16 ENCOUNTER — Encounter: Payer: Self-pay | Admitting: Physical Therapy

## 2022-04-16 DIAGNOSIS — R269 Unspecified abnormalities of gait and mobility: Secondary | ICD-10-CM | POA: Diagnosis not present

## 2022-04-16 DIAGNOSIS — R262 Difficulty in walking, not elsewhere classified: Secondary | ICD-10-CM

## 2022-04-16 DIAGNOSIS — M6281 Muscle weakness (generalized): Secondary | ICD-10-CM

## 2022-04-16 DIAGNOSIS — R2689 Other abnormalities of gait and mobility: Secondary | ICD-10-CM

## 2022-04-16 NOTE — Therapy (Signed)
OUTPATIENT PHYSICAL THERAPY NEURO TREATMENT   Patient Name: Christopher [name] MRN: [id number] DOB:February 05, 1951, 71 y.o., male Today's Date: 04/16/2022   PCP: Rusty Aus, MD REFERRING PROVIDER: Rusty Aus, MD      Past Medical History:  Diagnosis Date   Anxiety    CHF (congestive heart failure) ([place])    COPD (chronic obstructive pulmonary disease) ([place])    Depression    Dysrhythmia    Hyperlipidemia    Hypertension    Hypothyroidism    Sleep apnea    Past Surgical History:  Procedure Laterality Date   FRACTURE SURGERY     ORIF ANKLE FRACTURE Left 12/17/2020   Procedure: OPEN REDUCTION INTERNAL FIXATION (ORIF) ANKLE FRACTURE;  Surgeon: Samara Deist, DPM;  Location: [facility] ORS;  Service: Podiatry;  Laterality: Left;   Patient Active Problem List   Diagnosis Date Noted   Generalized weakness 10/16/2021   Chronic respiratory failure with hypoxia (HCC) 10/16/2021   Hypotension    SIRS (systemic inflammatory response syndrome) (HCC)    AF (paroxysmal atrial fibrillation) ([place])    Syncope    Severe sepsis ([place]) 05/20/2021   Pressure injury of skin 04/25/2021   Acute respiratory failure ([place]) 04/21/2021   Acute kidney injury superimposed on CKD ([place]) 04/21/2021   Stage 3b chronic kidney disease (CKD) ([place]) 03/22/2021   Leukocytosis 03/22/2021   Osteomyelitis of ankle or foot, acute, left ([place]) 03/17/2021   Acute on chronic respiratory failure ([place]) 03/17/2021   Osteomyelitis ([place]) 12/16/2020   Depression 12/16/2020   Medicare annual wellness visit, initial 11/14/2019   B12 deficiency 11/14/2019   Hyperlipidemia 10/31/2019   Primary osteoarthritis of right knee 03/16/2019   Sleep apnea 10/31/2018   Traumatic incomplete tear of left rotator cuff 10/02/2018   Morbid obesity with BMI of 45.0-49.9, adult ([place]) 09/20/2018   Hypothyroidism 09/20/2018   Chronic diastolic CHF (congestive heart failure) ([place]) 09/20/2018   Benign essential hypertension 09/20/2018     ONSET DATE: 10/16/20  REFERRING DIAG: R27.0 (ICD-10-CM) - Ataxia, unspecified  THERAPY DIAG:  Abnormality of gait and mobility  Difficulty in walking, not elsewhere classified  Muscle weakness (generalized)  Other abnormalities of gait and mobility  Rationale for Evaluation and Treatment Rehabilitation  SUBJECTIVE:                                                                                                                                                                                              SUBJECTIVE STATEMENT: Pt rpeorts some pain in his elbow musculature following last session likely due to heavy UE support in // bars, // bar activity deferred as a result.  No other changes since last session.   Pt accompanied by: family member DTR  PERTINENT HISTORY: Patient reports independence with most activities prior to initial injury of his ankle.  Pt reports he broke his left ankle in February 2022. Following this he had hospital admission and was recommended to have therapy on his ankle, however, pt caregiver reports he never was able to get back up to par with where he was before. Later he went to hospital and he was admitted to [place] for rehab where he stayed for 3 months followed by home health rehab where he was getting rehab up until going to [facility] for a few visits prior to scheduling appointment with our clinic.  Pt is getting Gel injection this moth on the right side for right knee pain as he states he is bone on bone.  Patient has had significant functional decline and is eager to return to higher levels of ambulation with improved balance responses.  Patient is also experiencing some signs of depression which she is currently on medication for secondary to his wife passing away this year.  As result patient's motivation for completing physical therapy activities in the home outside of regular physical therapy visits has been limited.  Pt reports his  stamina has been significantly impacted and he has a big fear for falling.   PAIN:  Are you having pain? No  PRECAUTIONS: Fall  WEIGHT BEARING RESTRICTIONS No  FALLS: Has patient fallen in last 6 months? Yes. Number of falls 2, once in shower and again in the bathroom.   LIVING ENVIRONMENT: Lives with: lives with their family Lives in: House/apartment Stairs: Yes: External: 1 steps; none 4 steps in garage with bilateral rails that are difficulty to grip.  Has following equipment at home: Gilford Rile - 2 wheeled, Environmental consultant - 4 wheeled, and shower chair  PLOF: Independent with household mobility with device, Independent with homemaking with ambulation, Independent with gait, and Leisure: making rings out of coins   PATIENT GOALS Improve ability to cook, be able to access farmer's market independently. Be able to go into restaurants.   OBJECTIVE:   DIAGNOSTIC FINDINGS: N/A  COGNITION: Overall cognitive status: Within functional limits for tasks assessed   SENSATION: Not tested        LOWER EXTREMITY ROM:   WNL   Active  Right Eval Left Eval  Hip flexion    Hip extension    Hip abduction    Hip adduction    Hip internal rotation    Hip external rotation    Knee flexion    Knee extension    Ankle dorsiflexion    Ankle plantarflexion    Ankle inversion    Ankle eversion     (Blank rows = not tested)  LOWER EXTREMITY MMT:    MMT Right Eval Left Eval  Hip flexion 4+ 4+  Hip extension    Hip abduction 4+ 4+  Hip adduction 4+ 4+  Hip internal rotation    Hip external rotation    Knee flexion 4+ 4+  Knee extension 4 4  Ankle dorsiflexion 4 4  Ankle plantarflexion 4+ 3+  Ankle inversion    Ankle eversion    (Blank rows = not tested)  BED MOBILITY:  Sleeps in recliner   TRANSFERS: Assistive device utilized: Environmental consultant - 2 wheeled  Sit to stand: Min A utilizes walker contact-guard assist and chair arm.  Daughter reports she frequently holds walker to prevent him  from pulling it  over with Stand to sit: Modified independence Chair to chair: CGA  STANDING BALANCE: standing endurance x 50 seconds no UE, difficulty with maintaining knee extension  GAIT:  Gait pattern: decreased step length- Right, Left, left ankle eversion/ hip external rotation  Distance walked: 20 feet Assistive device utilized: Walker - 2 wheeled Level of assistance: CGA Comments: Significant fatigue with assistance of ambulation.  Patient was very slow with turnaround as evidenced by 76.68 seconds timed up and go test.  Followed with wheelchair via daughter.  FUNCTIONAL TESTs:  5 times sit to stand: 18.77, significant UE support from walker and on chairs  TUG:76.68 sec  8/4: 10 m walk test performed with wheelchair follow and bariatric walker provided by patient.  Patient completes and 21.37 seconds in a 0.47 m/s speed. -No Loss of balance noted with significant fatigue following  PATIENT SURVEYS:  FOTO 47, risk adjusted goal of 57  TODAY'S TREATMENT: 04/16/22 There.ex  Seated: completed eac of the below following TA interventions  Heel raises on 1/2 foam roller: 2x12, min VC's for form/technique. Lateral hedgehog taps 10x each LE   BTB: -hamstring curl 15x each LE -clamshells 15x  TA Walking with RW: x 20 feet with 90 degree turn x 2 with seated rest between and WC follow  HR 117 post round 1, HR 113 following round 2   STS x 5 with UE support and maintenance of balance each rep ( 5-10 seconds no UE support)  HR 104 post        Pt session was cut a little short today due to pt arriving a few minutes late to scheduled appointment time  Education provided on improving activity tolerance at home. Ambulating in home short household distances with daughter provided W/c follow with out relying on urinal to improve strength, independence and ability to safely ambulate.    Pt required occasional rest breaks due fatigue, PT was quick to ask when pt appeared to be  fatiguing in order to prevent excessive fatigue.   PATIENT EDUCATION: Education details: form/technique with exercise. Optimizing function at home.  Person educated: Patient, DTR Education method: Explanation Education comprehension: verbalized understanding   HOME EXERCISE PROGRAM: Access Code: [email or web address] URL: [email or web address] Date: 03/26/2022 Prepared by: Rivka Barbara  Exercises - Seated March  - 1 x daily - 7 x weekly - 2 sets - 10 reps - Seated Long Arc Quad  - 1 x daily - 7 x weekly - 2 sets - 10 reps - Seated single leg step to side   - 1 x daily - 7 x weekly - 2 sets - 10 reps - Sit to Stand with Counter Support  - 1 x daily - 7 x weekly - 1 sets - 5 reps  GOALS Goals reviewed with patient? Yes  SHORT TERM GOALS: Target date: 04/19/2022     Patient will be independent in home exercise program to improve strength/mobility for better functional independence with ADLs. Baseline: No HEP currently  Goal status: INITIAL  LONG TERM GOALS: Target date: 06/14/2022 1.  Patient (> 67 years old) will complete five times sit to stand test in < 15 without assistance from walker for standing balance seconds indicating an increased LE strength and improved balance. Baseline: 18.77 with significant UE use and with walker for balance  Goal status: INITIAL  2.  Patient will increase FOTO score to equal to or greater than  57   to demonstrate statistically significant improvement in mobility and quality of life.  Baseline:  47 Goal status: INITIAL   3.   Patient will reduce timed up and go to <11 seconds to reduce fall risk and demonstrate improved transfer/gait ability. Baseline: 76.68 Goal status: INITIAL  4.   Patient will increase 10 meter walk test to >.64m/s as to improve gait speed for better community ambulation and to reduce fall risk. Baseline: 8/4: 21.36 sec (.4m/s) Goal status: INITIAL 5.  Pt will stand independently without UE assist and without  LOB for 3 min in order to indicate improved standing endurance and LE strength for cooking and meal prep activities .  Baseline: 50 seconds  Goal status: INITIAL       ASSESSMENT:  CLINICAL IMPRESSION: Patient presents with excellent motivation.Continued with current plan of care as laid out in evaluation and recent prior sessions. Pt remains motivated to advance progress toward goals in order to maximize independence and safety at home. Pt requires high level assistance and cuing for completion of exercises in order to provide adequate level of stimulation challenge while minimizing pain and discomfort when possible. Pt closely monitored throughout session pt response and to maximize patient safety during interventions. Pt had some pain in UE following last session secondary to heavy UE support on // bars, // bar activities deferred this date as a result. Pt vital signs responding well to interventions with improved response this session.  Pt continues to demonstrate progress toward goals AEB progression of interventions this date either in volume or intensity.     OBJECTIVE IMPAIRMENTS Abnormal gait, decreased activity tolerance, decreased balance, decreased mobility, difficulty walking, decreased strength, impaired perceived functional ability, and impaired flexibility.   ACTIVITY LIMITATIONS bending, standing, squatting, stairs, transfers, and locomotion level  PARTICIPATION LIMITATIONS: meal prep, cleaning, laundry, medication management, driving, and community activity  PERSONAL FACTORS Age and 3+ comorbidities: CHF, COPD, HLD, HTN  are also affecting patient's functional outcome.   REHAB POTENTIAL: Fair very deconditioned and has had significant therapy to this point. Also his motivation for HEPcompletion is low.   CLINICAL DECISION MAKING: Evolving/moderate complexity  EVALUATION COMPLEXITY: Moderate  PLAN: PT FREQUENCY: 2x/week  PT DURATION: 12 weeks  PLANNED  INTERVENTIONS: Therapeutic exercises, Therapeutic activity, Neuromuscular re-education, Balance training, Gait training, Patient/Family education, Self Care, Joint mobilization, Dry Needling, and Manual therapy  PLAN FOR NEXT SESSION: Begin with standing and walking tasks due to LE weakness and fatigue   Particia Lather PT  Physical Therapist- [facility]  04/16/2022, 8:19 AM

## 2022-04-20 ENCOUNTER — Ambulatory Visit: Payer: Medicare Other

## 2022-04-22 ENCOUNTER — Ambulatory Visit: Payer: Medicare Other

## 2022-04-22 DIAGNOSIS — R262 Difficulty in walking, not elsewhere classified: Secondary | ICD-10-CM

## 2022-04-22 DIAGNOSIS — M6281 Muscle weakness (generalized): Secondary | ICD-10-CM

## 2022-04-22 DIAGNOSIS — R269 Unspecified abnormalities of gait and mobility: Secondary | ICD-10-CM | POA: Diagnosis not present

## 2022-04-22 NOTE — Therapy (Signed)
OUTPATIENT PHYSICAL THERAPY NEURO TREATMENT   Patient Name: Christopher [name] MRN: [id number] DOB:31-Mar-1951, 72 y.o., male Today's Date: 04/22/2022   PCP: Rusty Aus, MD REFERRING PROVIDER: Rusty Aus, MD   PT End of Session - 04/22/22 1250     Visit Number 7    Number of Visits 24    Date for PT Re-Evaluation 06/14/22    Progress Note Due on Visit 10    PT Start Time 1300    PT Stop Time 1344    PT Time Calculation (min) 44 min    Equipment Utilized During Treatment Gait belt    Activity Tolerance Patient tolerated treatment well;No increased pain    Behavior During Therapy WFL for tasks assessed/performed               Past Medical History:  Diagnosis Date   Anxiety    CHF (congestive heart failure) (HCC)    COPD (chronic obstructive pulmonary disease) ([place])    Depression    Dysrhythmia    Hyperlipidemia    Hypertension    Hypothyroidism    Sleep apnea    Past Surgical History:  Procedure Laterality Date   FRACTURE SURGERY     ORIF ANKLE FRACTURE Left 12/17/2020   Procedure: OPEN REDUCTION INTERNAL FIXATION (ORIF) ANKLE FRACTURE;  Surgeon: Samara Deist, DPM;  Location: [facility] ORS;  Service: Podiatry;  Laterality: Left;   Patient Active Problem List   Diagnosis Date Noted   Generalized weakness 10/16/2021   Chronic respiratory failure with hypoxia (HCC) 10/16/2021   Hypotension    SIRS (systemic inflammatory response syndrome) (HCC)    AF (paroxysmal atrial fibrillation) ([place])    Syncope    Severe sepsis ([place]) 05/20/2021   Pressure injury of skin 04/25/2021   Acute respiratory failure ([place]) 04/21/2021   Acute kidney injury superimposed on CKD ([place]) 04/21/2021   Stage 3b chronic kidney disease (CKD) ([place]) 03/22/2021   Leukocytosis 03/22/2021   Osteomyelitis of ankle or foot, acute, left ([place]) 03/17/2021   Acute on chronic respiratory failure ([place]) 03/17/2021   Osteomyelitis ([place]) 12/16/2020   Depression 12/16/2020   Medicare annual wellness  visit, initial 11/14/2019   B12 deficiency 11/14/2019   Hyperlipidemia 10/31/2019   Primary osteoarthritis of right knee 03/16/2019   Sleep apnea 10/31/2018   Traumatic incomplete tear of left rotator cuff 10/02/2018   Morbid obesity with BMI of 45.0-49.9, adult ([place]) 09/20/2018   Hypothyroidism 09/20/2018   Chronic diastolic CHF (congestive heart failure) ([place]) 09/20/2018   Benign essential hypertension 09/20/2018    ONSET DATE: 10/16/20  REFERRING DIAG: R27.0 (ICD-10-CM) - Ataxia, unspecified  THERAPY DIAG:  Abnormality of gait and mobility  Difficulty in walking, not elsewhere classified  Muscle weakness (generalized)  Rationale for Evaluation and Treatment Rehabilitation  SUBJECTIVE:  SUBJECTIVE STATEMENT: Patient reports he has not been compliant with HEP.   Pt accompanied by: family member DTR  PERTINENT HISTORY: Patient reports independence with most activities prior to initial injury of his ankle.  Pt reports he broke his left ankle in February 2022. Following this he had hospital admission and was recommended to have therapy on his ankle, however, pt caregiver reports he never was able to get back up to par with where he was before. Later he went to hospital and he was admitted to [place] [facility] for rehab where he stayed for 3 months followed by home health rehab where he was getting rehab up until going to [facility] for a few visits prior to scheduling appointment with our clinic.  Pt is getting Gel injection this moth on the right side for right knee pain as he states he is bone on bone.  Patient has had significant functional decline and is eager to return to higher levels of ambulation with improved balance responses.  Patient is also experiencing some signs of depression which  she is currently on medication for secondary to his wife passing away this year.  As result patient's motivation for completing physical therapy activities in the home outside of regular physical therapy visits has been limited.  Pt reports his stamina has been significantly impacted and he has a big fear for falling.   PAIN:  Are you having pain? No  PRECAUTIONS: Fall  WEIGHT BEARING RESTRICTIONS No  FALLS: Has patient fallen in last 6 months? Yes. Number of falls 2, once in shower and again in the bathroom.   LIVING ENVIRONMENT: Lives with: lives with their family Lives in: House/apartment Stairs: Yes: External: 1 steps; none 4 steps in garage with bilateral rails that are difficulty to grip.  Has following equipment at home: Gilford Rile - 2 wheeled, Environmental consultant - 4 wheeled, and shower chair  PLOF: Independent with household mobility with device, Independent with homemaking with ambulation, Independent with gait, and Leisure: making rings out of coins   PATIENT GOALS Improve ability to cook, be able to access farmer's market independently. Be able to go into restaurants.   OBJECTIVE:   DIAGNOSTIC FINDINGS: N/A  COGNITION: Overall cognitive status: Within functional limits for tasks assessed   SENSATION: Not tested        LOWER EXTREMITY ROM:   WNL   Active  Right Eval Left Eval  Hip flexion    Hip extension    Hip abduction    Hip adduction    Hip internal rotation    Hip external rotation    Knee flexion    Knee extension    Ankle dorsiflexion    Ankle plantarflexion    Ankle inversion    Ankle eversion     (Blank rows = not tested)  LOWER EXTREMITY MMT:    MMT Right Eval Left Eval  Hip flexion 4+ 4+  Hip extension    Hip abduction 4+ 4+  Hip adduction 4+ 4+  Hip internal rotation    Hip external rotation    Knee flexion 4+ 4+  Knee extension 4 4  Ankle dorsiflexion 4 4  Ankle plantarflexion 4+ 3+  Ankle inversion    Ankle eversion    (Blank rows =  not tested)  BED MOBILITY:  Sleeps in recliner   TRANSFERS: Assistive device utilized: Environmental consultant - 2 wheeled  Sit to stand: Min A utilizes walker contact-guard assist and chair arm.  Daughter reports she frequently holds walker to  prevent him from pulling it over with Stand to sit: Modified independence Chair to chair: CGA  STANDING BALANCE: standing endurance x 50 seconds no UE, difficulty with maintaining knee extension  GAIT:  Gait pattern: decreased step length- Right, Left, left ankle eversion/ hip external rotation  Distance walked: 20 feet Assistive device utilized: Walker - 2 wheeled Level of assistance: CGA Comments: Significant fatigue with assistance of ambulation.  Patient was very slow with turnaround as evidenced by 76.68 seconds timed up and go test.  Followed with wheelchair via daughter.  FUNCTIONAL TESTs:  5 times sit to stand: 18.77, significant UE support from walker and on chairs  TUG:76.68 sec  8/4: 10 m walk test performed with wheelchair follow and bariatric walker provided by patient.  Patient completes and 21.37 seconds in a 0.47 m/s speed. -No Loss of balance noted with significant fatigue following  PATIENT SURVEYS:  FOTO 47, risk adjusted goal of 57  TODAY'S TREATMENT: 04/22/22  There.ex Standing in // bars:   Standing weight shifts with UE support 10x each side : HR 106  STS in // bars with hands on bars: HR 118.   Forward/backwards walking 2x length of // bars x 2 trials HR 118   Lateral stepping 2x length of // bars ; very challenging with L foot everting  Static stand throw balls into hoop x 15 HR 124   Standing 60 seconds    Seated:  6: step toe taps seated 30 seconds x2 trials HR to 99 Dynadisc hamstring isometric press 10 x 5 second holds  Dynadisc df/pf 15x each side Adduction ball squeeze 15x  Seated soccer ball kicks for coordination and sequencing x 4 minutes    2lb ankle weights: -march 10x each LE -step out 10x each  LE -LAQ 10x each LE   BTB hamstring curl 10x    Education provided on improving activity tolerance at home. Ambulating in home short household distances with daughter provided W/c follow with out relying on urinal to improve strength, independence and ability to safely ambulate.    Pt required occasional rest breaks due fatigue, PT was quick to ask when pt appeared to be fatiguing in order to prevent excessive fatigue.   PATIENT EDUCATION: Education details: form/technique with exercise. Optimizing function at home.  Person educated: Patient, DTR Education method: Explanation Education comprehension: verbalized understanding   HOME EXERCISE PROGRAM: Access Code: [email or web address] URL: [email or web address] Date: 03/26/2022 Prepared by: Rivka Barbara  Exercises - Seated March  - 1 x daily - 7 x weekly - 2 sets - 10 reps - Seated Long Arc Quad  - 1 x daily - 7 x weekly - 2 sets - 10 reps - Seated single leg step to side   - 1 x daily - 7 x weekly - 2 sets - 10 reps - Sit to Stand with Counter Support  - 1 x daily - 7 x weekly - 1 sets - 5 reps  GOALS Goals reviewed with patient? Yes  SHORT TERM GOALS: Target date: 04/19/2022     Patient will be independent in home exercise program to improve strength/mobility for better functional independence with ADLs. Baseline: No HEP currently  Goal status: INITIAL  LONG TERM GOALS: Target date: 06/14/2022 1.  Patient (> 73 years old) will complete five times sit to stand test in < 15 without assistance from walker for standing balance seconds indicating an increased LE strength and improved balance. Baseline: 18.77 with significant UE use and with walker for balance  Goal status: INITIAL  2.  Patient will increase FOTO score to equal to or greater than  57   to demonstrate statistically significant improvement in mobility and quality of life.  Baseline: 47 Goal status: INITIAL   3.   Patient will reduce timed up and go to  <11 seconds to reduce fall risk and demonstrate improved transfer/gait ability. Baseline: 76.68 Goal status: INITIAL  4.   Patient will increase 10 meter walk test to >.58m/s as to improve gait speed for better community ambulation and to reduce fall risk. Baseline: 8/4: 21.36 sec (.12m/s) Goal status: INITIAL 5.  Pt will stand independently without UE assist and without LOB for 3 min in order to indicate improved standing endurance and LE strength for cooking and meal prep activities .  Baseline: 50 seconds  Goal status: INITIAL       ASSESSMENT:  CLINICAL IMPRESSION: Patient educated on need for compliance with HEP in order to continue with therapy. He requires frequent rest breaks due to fatigue with standing. Lateral stepping is more challenging for patient than forward/backwards. Patient is limited in standing duration.  Pt will continue to benefit from skilled PT services to optimize function and independence with ADL's and reduced risk of falls.     OBJECTIVE IMPAIRMENTS Abnormal gait, decreased activity tolerance, decreased balance, decreased mobility, difficulty walking, decreased strength, impaired perceived functional ability, and impaired flexibility.   ACTIVITY LIMITATIONS bending, standing, squatting, stairs, transfers, and locomotion level  PARTICIPATION LIMITATIONS: meal prep, cleaning, laundry, medication management, driving, and community activity  PERSONAL FACTORS Age and 3+ comorbidities: CHF, COPD, HLD, HTN  are also affecting patient's functional outcome.   REHAB POTENTIAL: Fair very deconditioned and has had significant therapy to this point. Also his motivation for HEPcompletion is low.   CLINICAL DECISION MAKING: Evolving/moderate complexity  EVALUATION COMPLEXITY: Moderate  PLAN: PT FREQUENCY: 2x/week  PT DURATION: 12 weeks  PLANNED INTERVENTIONS: Therapeutic exercises, Therapeutic activity, Neuromuscular re-education, Balance training, Gait  training, Patient/Family education, Self Care, Joint mobilization, Dry Needling, and Manual therapy  PLAN FOR NEXT SESSION: Begin with standing and walking tasks due to LE weakness and fatigue   [facility]  04/22/2022, 1:45 PM

## 2022-04-27 ENCOUNTER — Ambulatory Visit: Payer: Medicare Other | Attending: Internal Medicine

## 2022-04-27 DIAGNOSIS — R2681 Unsteadiness on feet: Secondary | ICD-10-CM | POA: Insufficient documentation

## 2022-04-27 DIAGNOSIS — M6281 Muscle weakness (generalized): Secondary | ICD-10-CM | POA: Diagnosis present

## 2022-04-27 DIAGNOSIS — R262 Difficulty in walking, not elsewhere classified: Secondary | ICD-10-CM | POA: Insufficient documentation

## 2022-04-27 DIAGNOSIS — R2689 Other abnormalities of gait and mobility: Secondary | ICD-10-CM | POA: Diagnosis present

## 2022-04-27 DIAGNOSIS — R269 Unspecified abnormalities of gait and mobility: Secondary | ICD-10-CM | POA: Insufficient documentation

## 2022-04-27 NOTE — Therapy (Signed)
OUTPATIENT PHYSICAL THERAPY NEURO TREATMENT   Patient Name: Christopher [name] MRN: [id number] DOB:1950/09/22, 71 y.o., male Today's Date: 04/27/2022   PCP: Rusty Aus, MD REFERRING PROVIDER: Rusty Aus, MD   PT End of Session - 04/27/22 1024     Visit Number 8    Number of Visits 24    Date for PT Re-Evaluation 06/14/22    Progress Note Due on Visit 10    PT Start Time 1024    PT Stop Time 1100    PT Time Calculation (min) 36 min    Equipment Utilized During Treatment Gait belt    Activity Tolerance Patient tolerated treatment well;No increased pain    Behavior During Therapy WFL for tasks assessed/performed                Past Medical History:  Diagnosis Date   Anxiety    CHF (congestive heart failure) (HCC)    COPD (chronic obstructive pulmonary disease) ([place])    Depression    Dysrhythmia    Hyperlipidemia    Hypertension    Hypothyroidism    Sleep apnea    Past Surgical History:  Procedure Laterality Date   FRACTURE SURGERY     ORIF ANKLE FRACTURE Left 12/17/2020   Procedure: OPEN REDUCTION INTERNAL FIXATION (ORIF) ANKLE FRACTURE;  Surgeon: Samara Deist, DPM;  Location: [facility] ORS;  Service: Podiatry;  Laterality: Left;   Patient Active Problem List   Diagnosis Date Noted   Generalized weakness 10/16/2021   Chronic respiratory failure with hypoxia (HCC) 10/16/2021   Hypotension    SIRS (systemic inflammatory response syndrome) (HCC)    AF (paroxysmal atrial fibrillation) ([place])    Syncope    Severe sepsis ([place]) 05/20/2021   Pressure injury of skin 04/25/2021   Acute respiratory failure ([place]) 04/21/2021   Acute kidney injury superimposed on CKD ([place]) 04/21/2021   Stage 3b chronic kidney disease (CKD) ([place]) 03/22/2021   Leukocytosis 03/22/2021   Osteomyelitis of ankle or foot, acute, left ([place]) 03/17/2021   Acute on chronic respiratory failure ([place]) 03/17/2021   Osteomyelitis ([place]) 12/16/2020   Depression 12/16/2020   Medicare annual wellness  visit, initial 11/14/2019   B12 deficiency 11/14/2019   Hyperlipidemia 10/31/2019   Primary osteoarthritis of right knee 03/16/2019   Sleep apnea 10/31/2018   Traumatic incomplete tear of left rotator cuff 10/02/2018   Morbid obesity with BMI of 45.0-49.9, adult ([place]) 09/20/2018   Hypothyroidism 09/20/2018   Chronic diastolic CHF (congestive heart failure) ([place]) 09/20/2018   Benign essential hypertension 09/20/2018    ONSET DATE: 10/16/20  REFERRING DIAG: R27.0 (ICD-10-CM) - Ataxia, unspecified  THERAPY DIAG:  Muscle weakness (generalized)  Other abnormalities of gait and mobility  Unsteadiness on feet  Rationale for Evaluation and Treatment Rehabilitation  SUBJECTIVE:  SUBJECTIVE STATEMENT: Pt reports no current pain. Pt reports no stumbles/falls. No other current concerns reported. Pt daughter present and reports pt is getting his third injection to L knee today.  Pt accompanied by: family member DTR  PERTINENT HISTORY: Patient reports independence with most activities prior to initial injury of his ankle.  Pt reports he broke his left ankle in February 2022. Following this he had hospital admission and was recommended to have therapy on his ankle, however, pt caregiver reports he never was able to get back up to par with where he was before. Later he went to hospital and he was admitted to [place] for rehab where he stayed for 3 months followed by home health rehab where he was getting rehab up until going to [facility] for a few visits prior to scheduling appointment with our clinic.  Pt is getting Gel injection this moth on the right side for right knee pain as he states he is bone on bone.  Patient has had significant functional decline and is eager to return to higher levels of  ambulation with improved balance responses.  Patient is also experiencing some signs of depression which she is currently on medication for secondary to his wife passing away this year.  As result patient's motivation for completing physical therapy activities in the home outside of regular physical therapy visits has been limited.  Pt reports his stamina has been significantly impacted and he has a big fear for falling.   PAIN:  Are you having pain? No  PRECAUTIONS: Fall  WEIGHT BEARING RESTRICTIONS No  FALLS: Has patient fallen in last 6 months? Yes. Number of falls 2, once in shower and again in the bathroom.   LIVING ENVIRONMENT: Lives with: lives with their family Lives in: House/apartment Stairs: Yes: External: 1 steps; none 4 steps in garage with bilateral rails that are difficulty to grip.  Has following equipment at home: Gilford Rile - 2 wheeled, Environmental consultant - 4 wheeled, and shower chair  PLOF: Independent with household mobility with device, Independent with homemaking with ambulation, Independent with gait, and Leisure: making rings out of coins   PATIENT GOALS Improve ability to cook, be able to access farmer's market independently. Be able to go into restaurants.   OBJECTIVE:   DIAGNOSTIC FINDINGS: N/A  COGNITION: Overall cognitive status: Within functional limits for tasks assessed   SENSATION: Not tested        LOWER EXTREMITY ROM:   WNL   Active  Right Eval Left Eval  Hip flexion    Hip extension    Hip abduction    Hip adduction    Hip internal rotation    Hip external rotation    Knee flexion    Knee extension    Ankle dorsiflexion    Ankle plantarflexion    Ankle inversion    Ankle eversion     (Blank rows = not tested)  LOWER EXTREMITY MMT:    MMT Right Eval Left Eval  Hip flexion 4+ 4+  Hip extension    Hip abduction 4+ 4+  Hip adduction 4+ 4+  Hip internal rotation    Hip external rotation    Knee flexion 4+ 4+  Knee extension 4 4   Ankle dorsiflexion 4 4  Ankle plantarflexion 4+ 3+  Ankle inversion    Ankle eversion    (Blank rows = not tested)  BED MOBILITY:  Sleeps in recliner   TRANSFERS: Assistive device utilized: Environmental consultant - 2 wheeled  Sit  to stand: Min A utilizes walker contact-guard assist and chair arm.  Daughter reports she frequently holds walker to prevent him from pulling it over with Stand to sit: Modified independence Chair to chair: CGA  STANDING BALANCE: standing endurance x 50 seconds no UE, difficulty with maintaining knee extension  GAIT:  Gait pattern: decreased step length- Right, Left, left ankle eversion/ hip external rotation  Distance walked: 20 feet Assistive device utilized: Walker - 2 wheeled Level of assistance: CGA Comments: Significant fatigue with assistance of ambulation.  Patient was very slow with turnaround as evidenced by 76.68 seconds timed up and go test.  Followed with wheelchair via daughter.  FUNCTIONAL TESTs:  5 times sit to stand: 18.77, significant UE support from walker and on chairs  TUG:76.68 sec  8/4: 10 m walk test performed with wheelchair follow and bariatric walker provided by patient.  Patient completes and 21.37 seconds in a 0.47 m/s speed. -No Loss of balance noted with significant fatigue following  PATIENT SURVEYS:  FOTO 47, risk adjusted goal of 57  TODAY'S TREATMENT: 04/27/22  There.ex  Reviewed importance of HEP. Discussed possible techniques to motivate and incorporate exercises in day to day (start with one exercise per week and try to incorporate into daily activities, then progress to more exercises per week).   Seated Marches 15x2 sets each LE  LAQ 10x each LE Seated single leg step to side over half-foam 10x each side for 2 sets. Rates medium + STS 3x5. Cuing for technique to improve eccentric control with stand>sit. SPO2 87% immediately following set. Cuing for 2:4 breathing technique, SPO2% increased to 95% in <1 minute.   Standing  endurance at RW, CGA- 2x45 sec. Rates medium.    HR ranged from approx 90s-110 bpm throughout with interventions. Pt exhibit quick recovery with rest.     2.5lb ankle weights: -march 6x each LE -step out 8x each LE -LAQ 10x each LE    PT discussed and educated pt on progressive standing endurance at home. Encourages pt to add 1-2 minutes of standing time per day with his AD.  PATIENT EDUCATION: Education details: progressive standing endurance, exercise technique, body mechanics, importance of HEP Person educated: Patient, DTR Education method: Explanation Education comprehension: verbalized understanding   HOME EXERCISE PROGRAM: No updates today, pt to continue HEP as previously given Access Code: [email or web address] URL: [email or web address] Date: 03/26/2022 Prepared by: Rivka Barbara  Exercises - Seated March  - 1 x daily - 7 x weekly - 2 sets - 10 reps - Seated Long Arc Quad  - 1 x daily - 7 x weekly - 2 sets - 10 reps - Seated single leg step to side   - 1 x daily - 7 x weekly - 2 sets - 10 reps - Sit to Stand with Counter Support  - 1 x daily - 7 x weekly - 1 sets - 5 reps  GOALS Goals reviewed with patient? Yes  SHORT TERM GOALS: Target date: 04/19/2022     Patient will be independent in home exercise program to improve strength/mobility for better functional independence with ADLs. Baseline: No HEP currently  Goal status: INITIAL  LONG TERM GOALS: Target date: 06/14/2022 1.  Patient (> 41 years old) will complete five times sit to stand test in < 15 without assistance from walker for standing balance seconds indicating an increased LE strength and improved balance. Baseline: 18.77 with significant UE use and with walker for balance  Goal status: INITIAL  2.  Patient will  increase FOTO score to equal to or greater than  57   to demonstrate statistically significant improvement in mobility and quality of life.  Baseline: 47 Goal status: INITIAL   3.    Patient will reduce timed up and go to <11 seconds to reduce fall risk and demonstrate improved transfer/gait ability. Baseline: 76.68 Goal status: INITIAL  4.   Patient will increase 10 meter walk test to >.36m/s as to improve gait speed for better community ambulation and to reduce fall risk. Baseline: 8/4: 21.36 sec (.81m/s) Goal status: INITIAL 5.  Pt will stand independently without UE assist and without LOB for 3 min in order to indicate improved standing endurance and LE strength for cooking and meal prep activities .  Baseline: 50 seconds  Goal status: INITIAL       ASSESSMENT:  CLINICAL IMPRESSION: PT provided further encouragement on performing HEP and discussed progressive standing activity at home (see above). Pt able to perform 3 sets of STS and 2 sets of standing endurance activity. He generally rated interventions as medium and HR remained in 90s to below 110 bpm.  Pt will continue to benefit from skilled PT services to optimize function and independence with ADL's and reduced risk of falls.     OBJECTIVE IMPAIRMENTS Abnormal gait, decreased activity tolerance, decreased balance, decreased mobility, difficulty walking, decreased strength, impaired perceived functional ability, and impaired flexibility.   ACTIVITY LIMITATIONS bending, standing, squatting, stairs, transfers, and locomotion level  PARTICIPATION LIMITATIONS: meal prep, cleaning, laundry, medication management, driving, and community activity  PERSONAL FACTORS Age and 3+ comorbidities: CHF, COPD, HLD, HTN  are also affecting patient's functional outcome.   REHAB POTENTIAL: Fair very deconditioned and has had significant therapy to this point. Also his motivation for HEPcompletion is low.   CLINICAL DECISION MAKING: Evolving/moderate complexity  EVALUATION COMPLEXITY: Moderate  PLAN: PT FREQUENCY: 2x/week  PT DURATION: 12 weeks  PLANNED INTERVENTIONS: Therapeutic exercises, Therapeutic activity,  Neuromuscular re-education, Balance training, Gait training, Patient/Family education, Self Care, Joint mobilization, Dry Needling, and Manual therapy  PLAN FOR NEXT SESSION: Begin with standing and walking tasks due to LE weakness and fatigue, continue plan   [facility]  04/27/2022, 2:50 PM

## 2022-05-03 ENCOUNTER — Ambulatory Visit: Payer: Medicare Other

## 2022-05-03 DIAGNOSIS — R262 Difficulty in walking, not elsewhere classified: Secondary | ICD-10-CM

## 2022-05-03 DIAGNOSIS — M6281 Muscle weakness (generalized): Secondary | ICD-10-CM

## 2022-05-03 NOTE — Therapy (Signed)
OUTPATIENT PHYSICAL THERAPY NEURO TREATMENT   Patient Name: Christopher [name] MRN: [id number] DOB:12/22/1950, 71 y.o., male Today's Date: 05/03/2022   PCP: Rusty Aus, MD REFERRING PROVIDER: Rusty Aus, MD   PT End of Session - 05/03/22 1150     Visit Number 9    Number of Visits 24    Date for PT Re-Evaluation 06/14/22    Progress Note Due on Visit 10    PT Start Time 1104    PT Stop Time 1147    PT Time Calculation (min) 43 min    Equipment Utilized During Treatment Gait belt    Activity Tolerance Patient tolerated treatment well;No increased pain;Patient limited by fatigue    Behavior During Therapy [facility] for tasks assessed/performed                 Past Medical History:  Diagnosis Date   Anxiety    CHF (congestive heart failure) (HCC)    COPD (chronic obstructive pulmonary disease) ([place])    Depression    Dysrhythmia    Hyperlipidemia    Hypertension    Hypothyroidism    Sleep apnea    Past Surgical History:  Procedure Laterality Date   FRACTURE SURGERY     ORIF ANKLE FRACTURE Left 12/17/2020   Procedure: OPEN REDUCTION INTERNAL FIXATION (ORIF) ANKLE FRACTURE;  Surgeon: Samara Deist, DPM;  Location: [facility] ORS;  Service: Podiatry;  Laterality: Left;   Patient Active Problem List   Diagnosis Date Noted   Generalized weakness 10/16/2021   Chronic respiratory failure with hypoxia (HCC) 10/16/2021   Hypotension    SIRS (systemic inflammatory response syndrome) (HCC)    AF (paroxysmal atrial fibrillation) ([place])    Syncope    Severe sepsis ([place]) 05/20/2021   Pressure injury of skin 04/25/2021   Acute respiratory failure ([place]) 04/21/2021   Acute kidney injury superimposed on CKD ([place]) 04/21/2021   Stage 3b chronic kidney disease (CKD) ([place]) 03/22/2021   Leukocytosis 03/22/2021   Osteomyelitis of ankle or foot, acute, left ([place]) 03/17/2021   Acute on chronic respiratory failure ([place]) 03/17/2021   Osteomyelitis ([place]) 12/16/2020   Depression 12/16/2020    Medicare annual wellness visit, initial 11/14/2019   B12 deficiency 11/14/2019   Hyperlipidemia 10/31/2019   Primary osteoarthritis of right knee 03/16/2019   Sleep apnea 10/31/2018   Traumatic incomplete tear of left rotator cuff 10/02/2018   Morbid obesity with BMI of 45.0-49.9, adult ([place]) 09/20/2018   Hypothyroidism 09/20/2018   Chronic diastolic CHF (congestive heart failure) ([place]) 09/20/2018   Benign essential hypertension 09/20/2018    ONSET DATE: 10/16/20  REFERRING DIAG: R27.0 (ICD-10-CM) - Ataxia, unspecified  THERAPY DIAG:  Muscle weakness (generalized)  Difficulty in walking, not elsewhere classified  Rationale for Evaluation and Treatment Rehabilitation  SUBJECTIVE:  SUBJECTIVE STATEMENT: Pt had a good weekend, visited family. Ascended/descended stairs a few times. Had BUE support when doing this. He reports balance felt OK. Pt reports no current pain. Pt not wearing hearing aids.  Pt accompanied by: family member DTR  PERTINENT HISTORY: Patient reports independence with most activities prior to initial injury of his ankle.  Pt reports he broke his left ankle in February 2022. Following this he had hospital admission and was recommended to have therapy on his ankle, however, pt caregiver reports he never was able to get back up to par with where he was before. Later he went to hospital and he was admitted to [place] for rehab where he stayed for 3 months followed by home health rehab where he was getting rehab up until going to [facility] for a few visits prior to scheduling appointment with our clinic.  Pt is getting Gel injection this moth on the right side for right knee pain as he states he is bone on bone.  Patient has had significant functional decline and is eager to  return to higher levels of ambulation with improved balance responses.  Patient is also experiencing some signs of depression which she is currently on medication for secondary to his wife passing away this year.  As result patient's motivation for completing physical therapy activities in the home outside of regular physical therapy visits has been limited.  Pt reports his stamina has been significantly impacted and he has a big fear for falling.   PAIN:  Are you having pain? No  PRECAUTIONS: Fall  WEIGHT BEARING RESTRICTIONS No  FALLS: Has patient fallen in last 6 months? Yes. Number of falls 2, once in shower and again in the bathroom.   LIVING ENVIRONMENT: Lives with: lives with their family Lives in: House/apartment Stairs: Yes: External: 1 steps; none 4 steps in garage with bilateral rails that are difficulty to grip.  Has following equipment at home: Gilford Rile - 2 wheeled, Environmental consultant - 4 wheeled, and shower chair  PLOF: Independent with household mobility with device, Independent with homemaking with ambulation, Independent with gait, and Leisure: making rings out of coins   PATIENT GOALS Improve ability to cook, be able to access farmer's market independently. Be able to go into restaurants.   OBJECTIVE:   DIAGNOSTIC FINDINGS: N/A  COGNITION: Overall cognitive status: Within functional limits for tasks assessed   SENSATION: Not tested        LOWER EXTREMITY ROM:   WNL   Active  Right Eval Left Eval  Hip flexion    Hip extension    Hip abduction    Hip adduction    Hip internal rotation    Hip external rotation    Knee flexion    Knee extension    Ankle dorsiflexion    Ankle plantarflexion    Ankle inversion    Ankle eversion     (Blank rows = not tested)  LOWER EXTREMITY MMT:    MMT Right Eval Left Eval  Hip flexion 4+ 4+  Hip extension    Hip abduction 4+ 4+  Hip adduction 4+ 4+  Hip internal rotation    Hip external rotation    Knee flexion 4+ 4+   Knee extension 4 4  Ankle dorsiflexion 4 4  Ankle plantarflexion 4+ 3+  Ankle inversion    Ankle eversion    (Blank rows = not tested)  BED MOBILITY:  Sleeps in recliner   TRANSFERS: Assistive device utilized: Environmental consultant -  2 wheeled  Sit to stand: Min A utilizes walker contact-guard assist and chair arm.  Daughter reports she frequently holds walker to prevent him from pulling it over with Stand to sit: Modified independence Chair to chair: CGA  STANDING BALANCE: standing endurance x 50 seconds no UE, difficulty with maintaining knee extension  GAIT:  Gait pattern: decreased step length- Right, Left, left ankle eversion/ hip external rotation  Distance walked: 20 feet Assistive device utilized: Walker - 2 wheeled Level of assistance: CGA Comments: Significant fatigue with assistance of ambulation.  Patient was very slow with turnaround as evidenced by 76.68 seconds timed up and go test.  Followed with wheelchair via daughter.  FUNCTIONAL TESTs:  5 times sit to stand: 18.77, significant UE support from walker and on chairs  TUG:76.68 sec  8/4: 10 m walk test performed with wheelchair follow and bariatric walker provided by patient.  Patient completes and 21.37 seconds in a 0.47 m/s speed. -No Loss of balance noted with significant fatigue following  PATIENT SURVEYS:  FOTO 47, risk adjusted goal of 57  TODAY'S TREATMENT: 05/03/22  There.ex Seated- 3lb ankle weights: -march 12x2 sets each LE -LAQ 2x10 each LE  -step out 2x12 each LE  STS 3x6 to RW  Nustep lvls 1-4 in 60 sec intervals, adjusted by PT throughout. Pt completes 8 minutes total. SPO2% remained in 90s. Pt visibly fatigued by end of intervention. Close CGA for mount/dismount and set-up.  PATIENT EDUCATION: Education details: Pt educated throughout session about proper posture and technique with exercises. Improved exercise technique, movement at target joints, use of target muscles after min to mod verbal, visual,  tactile cues.  Person educated: Patient, DTR Education method: Explanation, Demonstration, and Verbal cues Education comprehension: verbalized understanding, returned demonstration, verbal cues required, and needs further education   HOME EXERCISE PROGRAM: No updates today, pt to continue HEP as previously given Access Code: [email or web address] URL: [email or web address] Date: 03/26/2022 Prepared by: Rivka Barbara  Exercises - Seated March  - 1 x daily - 7 x weekly - 2 sets - 10 reps - Seated Long Arc Quad  - 1 x daily - 7 x weekly - 2 sets - 10 reps - Seated single leg step to side   - 1 x daily - 7 x weekly - 2 sets - 10 reps - Sit to Stand with Counter Support  - 1 x daily - 7 x weekly - 1 sets - 5 reps  GOALS Goals reviewed with patient? Yes  SHORT TERM GOALS: Target date: 04/19/2022     Patient will be independent in home exercise program to improve strength/mobility for better functional independence with ADLs. Baseline: No HEP currently  Goal status: INITIAL  LONG TERM GOALS: Target date: 06/14/2022 1.  Patient (> 80 years old) will complete five times sit to stand test in < 15 without assistance from walker for standing balance seconds indicating an increased LE strength and improved balance. Baseline: 18.77 with significant UE use and with walker for balance  Goal status: INITIAL  2.  Patient will increase FOTO score to equal to or greater than  57   to demonstrate statistically significant improvement in mobility and quality of life.  Baseline: 47 Goal status: INITIAL   3.   Patient will reduce timed up and go to <11 seconds to reduce fall risk and demonstrate improved transfer/gait ability. Baseline: 76.68 Goal status: INITIAL  4.   Patient will increase 10 meter walk test to >.59m/s as to improve gait  speed for better community ambulation and to reduce fall risk. Baseline: 8/4: 21.36 sec (.47m/s) Goal status: INITIAL 5.  Pt will stand independently  without UE assist and without LOB for 3 min in order to indicate improved standing endurance and LE strength for cooking and meal prep activities .  Baseline: 50 seconds  Goal status: INITIAL       ASSESSMENT:  CLINICAL IMPRESSION: Pt shows progress by performing interventions with greater resistance and/or volume of reps. Pt also able to progress to more challenging endurance intervention on nustep. Vitals were able to achieve therapeutic range and remain WNL throughout. Pt was visibly fatigued by end of session, however. The pt will continue to benefit from skilled PT services to optimize function and independence with ADL's and reduced risk of falls.     OBJECTIVE IMPAIRMENTS Abnormal gait, decreased activity tolerance, decreased balance, decreased mobility, difficulty walking, decreased strength, impaired perceived functional ability, and impaired flexibility.   ACTIVITY LIMITATIONS bending, standing, squatting, stairs, transfers, and locomotion level  PARTICIPATION LIMITATIONS: meal prep, cleaning, laundry, medication management, driving, and community activity  PERSONAL FACTORS Age and 3+ comorbidities: CHF, COPD, HLD, HTN  are also affecting patient's functional outcome.   REHAB POTENTIAL: Fair very deconditioned and has had significant therapy to this point. Also his motivation for HEPcompletion is low.   CLINICAL DECISION MAKING: Evolving/moderate complexity  EVALUATION COMPLEXITY: Moderate  PLAN: PT FREQUENCY: 2x/week  PT DURATION: 12 weeks  PLANNED INTERVENTIONS: Therapeutic exercises, Therapeutic activity, Neuromuscular re-education, Balance training, Gait training, Patient/Family education, Self Care, Joint mobilization, Dry Needling, and Manual therapy  PLAN FOR NEXT SESSION: Begin with standing and walking tasks due to LE weakness and fatigue, nustep continue plan   [facility]   05/03/2022, 11:56 AM

## 2022-05-05 ENCOUNTER — Ambulatory Visit: Payer: Medicare Other | Admitting: Physical Therapy

## 2022-05-05 DIAGNOSIS — R269 Unspecified abnormalities of gait and mobility: Secondary | ICD-10-CM

## 2022-05-05 DIAGNOSIS — R262 Difficulty in walking, not elsewhere classified: Secondary | ICD-10-CM

## 2022-05-05 DIAGNOSIS — M6281 Muscle weakness (generalized): Secondary | ICD-10-CM

## 2022-05-05 DIAGNOSIS — R2689 Other abnormalities of gait and mobility: Secondary | ICD-10-CM

## 2022-05-05 DIAGNOSIS — R2681 Unsteadiness on feet: Secondary | ICD-10-CM

## 2022-05-05 NOTE — Therapy (Signed)
OUTPATIENT PHYSICAL THERAPY NEURO TREATMENT/ Physical Therapy Progress Note   Dates of reporting period  03/22/22   to   05/05/22    Patient Name: Christopher [name] MRN: [id number] DOB:01-17-1951, 71 y.o., male Today's Date: 05/05/2022   PCP: Rusty Aus, MD REFERRING PROVIDER: Rusty Aus, MD   PT End of Session - 05/05/22 0851     Visit Number 10    Number of Visits 24    Date for PT Re-Evaluation 06/14/22    Progress Note Due on Visit 10    PT Start Time 0847    PT Stop Time 0930    PT Time Calculation (min) 43 min    Equipment Utilized During Treatment Gait belt    Activity Tolerance Patient tolerated treatment well;No increased pain;Patient limited by fatigue    Behavior During Therapy [facility] for tasks assessed/performed                 Past Medical History:  Diagnosis Date   Anxiety    CHF (congestive heart failure) (HCC)    COPD (chronic obstructive pulmonary disease) ([place])    Depression    Dysrhythmia    Hyperlipidemia    Hypertension    Hypothyroidism    Sleep apnea    Past Surgical History:  Procedure Laterality Date   FRACTURE SURGERY     ORIF ANKLE FRACTURE Left 12/17/2020   Procedure: OPEN REDUCTION INTERNAL FIXATION (ORIF) ANKLE FRACTURE;  Surgeon: Samara Deist, DPM;  Location: [facility] ORS;  Service: Podiatry;  Laterality: Left;   Patient Active Problem List   Diagnosis Date Noted   Generalized weakness 10/16/2021   Chronic respiratory failure with hypoxia (HCC) 10/16/2021   Hypotension    SIRS (systemic inflammatory response syndrome) (HCC)    AF (paroxysmal atrial fibrillation) ([place])    Syncope    Severe sepsis ([place]) 05/20/2021   Pressure injury of skin 04/25/2021   Acute respiratory failure ([place]) 04/21/2021   Acute kidney injury superimposed on CKD ([place]) 04/21/2021   Stage 3b chronic kidney disease (CKD) ([place]) 03/22/2021   Leukocytosis 03/22/2021   Osteomyelitis of ankle or foot, acute, left ([place]) 03/17/2021   Acute on chronic  respiratory failure ([place]) 03/17/2021   Osteomyelitis ([place]) 12/16/2020   Depression 12/16/2020   Medicare annual wellness visit, initial 11/14/2019   B12 deficiency 11/14/2019   Hyperlipidemia 10/31/2019   Primary osteoarthritis of right knee 03/16/2019   Sleep apnea 10/31/2018   Traumatic incomplete tear of left rotator cuff 10/02/2018   Morbid obesity with BMI of 45.0-49.9, adult ([place]) 09/20/2018   Hypothyroidism 09/20/2018   Chronic diastolic CHF (congestive heart failure) ([place]) 09/20/2018   Benign essential hypertension 09/20/2018    ONSET DATE: 10/16/20  REFERRING DIAG: R27.0 (ICD-10-CM) - Ataxia, unspecified  THERAPY DIAG:  Muscle weakness (generalized)  Difficulty in walking, not elsewhere classified  Other abnormalities of gait and mobility  Unsteadiness on feet  Abnormality of gait and mobility  Rationale for Evaluation and Treatment Rehabilitation  SUBJECTIVE:  SUBJECTIVE STATEMENT: Pt had a good weekend, visited family. Ascended/descended stairs with assistance from caregiver(s). Pt has not been compliant with HEP but has dome some of the exercises on occasion.   Pt accompanied by: family member DTR  PERTINENT HISTORY: Patient reports independence with most activities prior to initial injury of his ankle.  Pt reports he broke his left ankle in February 2022. Following this he had hospital admission and was recommended to have therapy on his ankle, however, pt caregiver reports he never was able to get back up to par with where he was before. Later he went to hospital and he was admitted to [place] for rehab where he stayed for 3 months followed by home health rehab where he was getting rehab up until going to [facility] for a few visits prior to scheduling appointment  with our clinic.  Pt is getting Gel injection this moth on the right side for right knee pain as he states he is bone on bone.  Patient has had significant functional decline and is eager to return to higher levels of ambulation with improved balance responses.  Patient is also experiencing some signs of depression which she is currently on medication for secondary to his wife passing away this year.  As result patient's motivation for completing physical therapy activities in the home outside of regular physical therapy visits has been limited.  Pt reports his stamina has been significantly impacted and he has a big fear for falling.   PAIN:  Are you having pain? No  PRECAUTIONS: Fall  WEIGHT BEARING RESTRICTIONS No  FALLS: Has patient fallen in last 6 months? Yes. Number of falls 2, once in shower and again in the bathroom.   LIVING ENVIRONMENT: Lives with: lives with their family Lives in: House/apartment Stairs: Yes: External: 1 steps; none 4 steps in garage with bilateral rails that are difficulty to grip.  Has following equipment at home: Gilford Rile - 2 wheeled, Environmental consultant - 4 wheeled, and shower chair  PLOF: Independent with household mobility with device, Independent with homemaking with ambulation, Independent with gait, and Leisure: making rings out of coins   PATIENT GOALS Improve ability to cook, be able to access farmer's market independently. Be able to go into restaurants.   OBJECTIVE:   DIAGNOSTIC FINDINGS: N/A  COGNITION: Overall cognitive status: Within functional limits for tasks assessed   SENSATION: Not tested        LOWER EXTREMITY ROM:   WNL   Active  Right Eval Left Eval  Hip flexion    Hip extension    Hip abduction    Hip adduction    Hip internal rotation    Hip external rotation    Knee flexion    Knee extension    Ankle dorsiflexion    Ankle plantarflexion    Ankle inversion    Ankle eversion     (Blank rows = not tested)  LOWER EXTREMITY  MMT:    MMT Right Eval Left Eval  Hip flexion 4+ 4+  Hip extension    Hip abduction 4+ 4+  Hip adduction 4+ 4+  Hip internal rotation    Hip external rotation    Knee flexion 4+ 4+  Knee extension 4 4  Ankle dorsiflexion 4 4  Ankle plantarflexion 4+ 3+  Ankle inversion    Ankle eversion    (Blank rows = not tested)  BED MOBILITY:  Sleeps in recliner   TRANSFERS: Assistive device utilized: Environmental consultant - 2 wheeled  Sit to stand: Min A utilizes walker contact-guard assist and chair arm.  Daughter reports she frequently holds walker to prevent him from pulling it over with Stand to sit: Modified independence Chair to chair: CGA  STANDING BALANCE: standing endurance x 50 seconds no UE, difficulty with maintaining knee extension  GAIT:  Gait pattern: decreased step length- Right, Left, left ankle eversion/ hip external rotation  Distance walked: 20 feet Assistive device utilized: Walker - 2 wheeled Level of assistance: CGA Comments: Significant fatigue with assistance of ambulation.  Patient was very slow with turnaround as evidenced by 76.68 seconds timed up and go test.  Followed with wheelchair via daughter.  FUNCTIONAL TESTs:  5 times sit to stand: 18.77, significant UE support from walker and on chairs  TUG:76.68 sec  8/4: 10 m walk test performed with wheelchair follow and bariatric walker provided by patient.  Patient completes and 21.37 seconds in a 0.47 m/s speed. -No Loss of balance noted with significant fatigue following  PATIENT SURVEYS:  FOTO 47, risk adjusted goal of 57  TODAY'S TREATMENT: 05/05/22 Physical therapy treatment session today consisted of completing assessment of goals and administration of testing as demonstrated in flow sheet and goals section of this note. Addition treatments may be found below.   Pt SPO2 remains at or above 96 for all testing and interventions with exception of 10 M walk test  where Spo2 drops to 91 but recovers to 97 within 1 min  of seated rest   There.ex Seated- 3lb ankle weights: -march 12x2 sets each LE -LAQ 2x10 each LE  - ambulation x 30 feet with bariatric RW    PATIENT EDUCATION: Education details: Pt educated throughout session about proper posture and technique with exercises. Improved exercise technique, movement at target joints, use of target muscles after min to mod verbal, visual, tactile cues.  Person educated: Patient, DTR Education method: Explanation, Demonstration, and Verbal cues Education comprehension: verbalized understanding, returned demonstration, verbal cues required, and needs further education   HOME EXERCISE PROGRAM: No updates today, pt to continue HEP as previously given Access Code: [email or web address] URL: [email or web address] Date: 03/26/2022 Prepared by: Rivka Barbara  Exercises - Seated March  - 1 x daily - 7 x weekly - 2 sets - 10 reps - Seated Long Arc Quad  - 1 x daily - 7 x weekly - 2 sets - 10 reps - Seated single leg step to side   - 1 x daily - 7 x weekly - 2 sets - 10 reps - Sit to Stand with Counter Support  - 1 x daily - 7 x weekly - 1 sets - 5 reps  GOALS Goals reviewed with patient? Yes  SHORT TERM GOALS: Target date: 06/02/2022       Patient will be independent in home exercise program to improve strength/mobility for better functional independence with ADLs. Baseline: No HEP currently 9/13: Pt not compliant with HEP nut doing some of the exercises intermittently  Goal status: NOT MET  LONG TERM GOALS: Target date: 06/14/2022 1.  Patient (> 83 years old) will complete five times sit to stand test in < 15 seconds without assistance from walker for standing balance indicating an increased LE strength and improved balance. Baseline: 18.77 with significant UE use and with walker for balance 16.96 sec with UE assist on walker Goal status: IN PROGRESS  2.  Patient will increase FOTO score to equal to or greater than  57   to demonstrate  statistically significant improvement in  mobility and quality of life.  Baseline: 47 9/13: 67 Goal status: MET     3.   Patient will reduce timed up and go to <11 seconds to reduce fall risk and demonstrate improved transfer/gait ability. Baseline: 76.68 9/13:51.07 sec Goal status: IN PROGRESS  4.   Patient will increase 10 meter walk test to >.6ms as to improve gait speed for better community ambulation and to reduce fall risk. Baseline: 8/4: 21.36 sec (.456m) 9/13:22.93 Goal status: IN PROGRESS  5.  Pt will stand independently without UE assist and without LOB for 3 min in order to indicate improved standing endurance and LE strength for cooking and meal prep activities .  Baseline: 50 seconds 9/13: 55 seconds no UE, 1:30 with UE assist  Goal status: IN PROGRESS       ASSESSMENT:  CLINICAL IMPRESSION: Pt shows progress with all goals with exception of 10 M walk test.  Patient highly encouraged to pursue patent home exercise program in order to improve his progress.  Patient's condition has the potential to improve in response to therapy. Maximum improvement is yet to be obtained. The anticipated improvement is attainable and reasonable in a generally predictable time.  The pt will continue to benefit from skilled PT services to optimize function and independence with ADL's and reduced risk of falls.     OBJECTIVE IMPAIRMENTS Abnormal gait, decreased activity tolerance, decreased balance, decreased mobility, difficulty walking, decreased strength, impaired perceived functional ability, and impaired flexibility.   ACTIVITY LIMITATIONS bending, standing, squatting, stairs, transfers, and locomotion level  PARTICIPATION LIMITATIONS: meal prep, cleaning, laundry, medication management, driving, and community activity  PERSONAL FACTORS Age and 3+ comorbidities: CHF, COPD, HLD, HTN  are also affecting patient's functional outcome.   REHAB POTENTIAL: Fair very deconditioned and  has had significant therapy to this point. Also his motivation for HEPcompletion is low.   CLINICAL DECISION MAKING: Evolving/moderate complexity  EVALUATION COMPLEXITY: Moderate  PLAN: PT FREQUENCY: 2x/week  PT DURATION: 12 weeks  PLANNED INTERVENTIONS: Therapeutic exercises, Therapeutic activity, Neuromuscular re-education, Balance training, Gait training, Patient/Family education, Self Care, Joint mobilization, Dry Needling, and Manual therapy  PLAN FOR NEXT SESSION: Continue with standing and walking tasks due to LE weakness and fatigue, nustep continue plan   [facility]9/13/2023, 8:52 AM

## 2022-05-11 ENCOUNTER — Ambulatory Visit: Payer: Medicare Other

## 2022-05-11 DIAGNOSIS — R262 Difficulty in walking, not elsewhere classified: Secondary | ICD-10-CM

## 2022-05-11 DIAGNOSIS — R2681 Unsteadiness on feet: Secondary | ICD-10-CM

## 2022-05-11 DIAGNOSIS — M6281 Muscle weakness (generalized): Secondary | ICD-10-CM

## 2022-05-11 DIAGNOSIS — R2689 Other abnormalities of gait and mobility: Secondary | ICD-10-CM

## 2022-05-11 DIAGNOSIS — R269 Unspecified abnormalities of gait and mobility: Secondary | ICD-10-CM

## 2022-05-11 NOTE — Therapy (Signed)
OUTPATIENT PHYSICAL THERAPY NEURO TREATMENT   Patient Name: Christopher [name] MRN: [id number] DOB:05/15/1951, 71 y.o., male Today's Date: 05/11/2022   PCP: Rusty Aus, MD REFERRING PROVIDER: Rusty Aus, MD   PT End of Session - 05/11/22 0940     Visit Number 11    Number of Visits 24    Date for PT Re-Evaluation 06/14/22    Authorization Type [employer] Medicare    Authorization Time Period 03/22/22-06/14/22    PT Start Time 0932    PT Stop Time 1012    PT Time Calculation (min) 40 min    Equipment Utilized During Treatment Gait belt    Activity Tolerance Patient tolerated treatment well;No increased pain    Behavior During Therapy WFL for tasks assessed/performed                 Past Medical History:  Diagnosis Date   Anxiety    CHF (congestive heart failure) (HCC)    COPD (chronic obstructive pulmonary disease) ([place])    Depression    Dysrhythmia    Hyperlipidemia    Hypertension    Hypothyroidism    Sleep apnea    Past Surgical History:  Procedure Laterality Date   FRACTURE SURGERY     ORIF ANKLE FRACTURE Left 12/17/2020   Procedure: OPEN REDUCTION INTERNAL FIXATION (ORIF) ANKLE FRACTURE;  Surgeon: Samara Deist, DPM;  Location: [facility] ORS;  Service: Podiatry;  Laterality: Left;   Patient Active Problem List   Diagnosis Date Noted   Generalized weakness 10/16/2021   Chronic respiratory failure with hypoxia (HCC) 10/16/2021   Hypotension    SIRS (systemic inflammatory response syndrome) (HCC)    AF (paroxysmal atrial fibrillation) ([place])    Syncope    Severe sepsis ([place]) 05/20/2021   Pressure injury of skin 04/25/2021   Acute respiratory failure ([place]) 04/21/2021   Acute kidney injury superimposed on CKD ([place]) 04/21/2021   Stage 3b chronic kidney disease (CKD) ([place]) 03/22/2021   Leukocytosis 03/22/2021   Osteomyelitis of ankle or foot, acute, left ([place]) 03/17/2021   Acute on chronic respiratory failure ([place]) 03/17/2021   Osteomyelitis ([place]) 12/16/2020    Depression 12/16/2020   Medicare annual wellness visit, initial 11/14/2019   B12 deficiency 11/14/2019   Hyperlipidemia 10/31/2019   Primary osteoarthritis of right knee 03/16/2019   Sleep apnea 10/31/2018   Traumatic incomplete tear of left rotator cuff 10/02/2018   Morbid obesity with BMI of 45.0-49.9, adult ([place]) 09/20/2018   Hypothyroidism 09/20/2018   Chronic diastolic CHF (congestive heart failure) ([place]) 09/20/2018   Benign essential hypertension 09/20/2018    ONSET DATE: 10/16/20  REFERRING DIAG: R27.0 (ICD-10-CM) - Ataxia, unspecified  THERAPY DIAG:  Muscle weakness (generalized)  Difficulty in walking, not elsewhere classified  Other abnormalities of gait and mobility  Unsteadiness on feet  Abnormality of gait and mobility  Rationale for Evaluation and Treatment Rehabilitation  SUBJECTIVE:  SUBJECTIVE STATEMENT: No updates since last session. Pt denies pain MD updates.    Pt accompanied by: family member DTR  PERTINENT HISTORY: Pt is s/p ankle fracture February 2022. Patient reports independence with most activities prior to initial injury of his ankle.  [name] to STR at [facility], then OPPT at [facility] [place] [facility]. Pt has struggled to return to premorbid status. Patient recently started medication for new depression which began after wife passing away this year.  Depression has been a barrier to compliance with HEP in home. Pt reports progressive deconditioning 2/2 falls anxiety.  PAIN:  Are you having pain? No  PRECAUTIONS: Fall  WEIGHT BEARING RESTRICTIONS No  LIVING ENVIRONMENT: Lives with: lives with their family Lives in: House/apartment Stairs: Yes: External: 1 steps; none 4 steps in garage with bilateral rails that are difficulty to grip.  Has following  equipment at home: Gilford Rile - 2 wheeled, Environmental consultant - 4 wheeled, and shower chair  PLOF: Independent with household mobility with device, Independent with homemaking with ambulation, Independent with gait, and Leisure: making rings out of coins   PATIENT GOALS Improve ability to cook, be able to access farmer's market independently. Be able to go into restaurants.     TODAY'S TREATMENT: 05/11/22 Physical therapy treatment session today consisted of completing assessment of goals and administration of testing as demonstrated in flow sheet and goals section of this note. Addition treatments may be found below.     INTERVENTION THIS DATE:   -stand pivot WC to high/low mat x1 c BRW     SET 1  -STS from elevated plinth 1x5 c BRW supervision level  -Marching c 5lb AW bilat 1x20   -STS from elevated plinth 1x5 c BRW supervision level  -Marching c 5lb AW bilat 1x20   -STS from elevated plinth 1x5 c BRW supervision level, added in 1 sec hands free balance each time  -Marching c 5lb AW bilat 1x20    SET 2  -stand pivot transfer elevated plinth to WC + airex pad x4 c BRW   -stand pivot transfer elevated plinth to WC + airex pad x4 c BRW    SET 3   -LAQ 1x10 5lb AW bilat   -seated ankle PF 5lb AW 1x20 bilat  -seated ankle DF 1x20 bilat   -LAQ 1x10 5lb AW bilat   -seated ankle PF 5lb AW 1x20 bilat  -seated ankle DF 1x20 bilat    STEP TRAINING   -c BRW STS -> FWD 93f, backward 549f fwd [id number] turn buttocks to WC (minguard to supervision level)     PATIENT EDUCATION: Education details: Pt educated throughout session about proper posture and technique with exercises. Improved exercise technique, movement at target joints, use of target muscles after min to mod verbal, visual, tactile cues.  Person educated: Patient, DTR Education method: Explanation, Demonstration, and Verbal cues Education comprehension: verbalized understanding, returned demonstration, verbal cues required,  and needs further education   HOME EXERCISE PROGRAM: No updates today, pt to continue HEP as previously given Access Code: [email or web address]: [email or web address] Date: 03/26/2022 Prepared by: ChriRivka Barbaraercises - Seated March  - 1 x daily - 7 x weekly - 2 sets - 10 reps - Seated Long Arc Quad  - 1 x daily - 7 x weekly - 2 sets - 10 reps - Seated single leg step to side   - 1 x daily - 7 x weekly - 2 sets - 10 reps - Sit  to Stand with Counter Support  - 1 x daily - 7 x weekly - 1 sets - 5 reps  GOALS Goals reviewed with patient? Yes  SHORT TERM GOALS: Target date: 06/02/2022       Patient will be independent in home exercise program to improve strength/mobility for better functional independence with ADLs. Baseline: No HEP currently 9/13: Pt not compliant with HEP nut doing some of the exercises intermittently  Goal status: NOT MET  LONG TERM GOALS: Target date: 06/14/2022 1.  Patient (> 57 years old) will complete five times sit to stand test in < 15 seconds without assistance from walker for standing balance indicating an increased LE strength and improved balance. Baseline: 18.77 with significant UE use and with walker for balance 16.96 sec with UE assist on walker Goal status: IN PROGRESS  2.  Patient will increase FOTO score to equal to or greater than  57   to demonstrate statistically significant improvement in mobility and quality of life.  Baseline: 47 9/13: 67 Goal status: MET     3.   Patient will reduce timed up and go to <11 seconds to reduce fall risk and demonstrate improved transfer/gait ability. Baseline: 76.68 9/13:51.07 sec Goal status: IN PROGRESS  4.   Patient will increase 10 meter walk test to >.55ms as to improve gait speed for better community ambulation and to reduce fall risk. Baseline: 8/4: 21.36 sec (.448m) 9/13:22.93 Goal status: IN PROGRESS  5.  Pt will stand independently without UE assist and without LOB for 3 min  in order to indicate improved standing endurance and LE strength for cooking and meal prep activities .  Baseline: 50 seconds 9/13: 55 seconds no UE, 1:30 with UE assist  Goal status: IN PROGRESS       ASSESSMENT:  CLINICAL IMPRESSION: Worked on circuit training this date to maintain consistent moderate level exertion, good tolerance with brief recovery breaks. Pt able to advance AW to 5lb this date, where he has been on 3lb weights for several weeks. Pt also able to demonstrate >24 transfers in session without remarkable fatigue, a true sign of progress. DTR attends session throughout, provides copious hydration opportunities for patient and elects to check his O2 sats and HR after each exercise each time, author informed her this is not necessary, but it seems to bring her comfort to have awareness of vitals response throughout. Pt continues to progress toward goals overall and generalized improvements in activity tolerance are noted.    OBJECTIVE IMPAIRMENTS Abnormal gait, decreased activity tolerance, decreased balance, decreased mobility, difficulty walking, decreased strength, impaired perceived functional ability, and impaired flexibility.   ACTIVITY LIMITATIONS bending, standing, squatting, stairs, transfers, and locomotion level  PARTICIPATION LIMITATIONS: meal prep, cleaning, laundry, medication management, driving, and community activity  PERSONAL FACTORS Age and 3+ comorbidities: CHF, COPD, HLD, HTN  are also affecting patient's functional outcome.   REHAB POTENTIAL: Fair very deconditioned and has had significant therapy to this point. Also his motivation for HEPcompletion is low.   CLINICAL DECISION MAKING: Evolving/moderate complexity  EVALUATION COMPLEXITY: Moderate  PLAN: PT FREQUENCY: 2x/week  PT DURATION: 12 weeks  PLANNED INTERVENTIONS: Therapeutic exercises, Therapeutic activity, Neuromuscular re-education, Balance training, Gait training, Patient/Family  education, Self Care, Joint mobilization, Dry Needling, and Manual therapy  PLAN FOR NEXT SESSION: Continue with standing and walking tasks due to LE weakness and fatigue, nustep continue plan   AlEtta GrandchildT  10:29 AM, 05/11/22 AlEtta GrandchildPT, DPT Physical Therapist -  [facility]  [phone] ([place])    05/11/2022, 9:50 AM

## 2022-05-13 ENCOUNTER — Ambulatory Visit: Payer: Medicare Other

## 2022-05-13 DIAGNOSIS — R2681 Unsteadiness on feet: Secondary | ICD-10-CM

## 2022-05-13 DIAGNOSIS — M6281 Muscle weakness (generalized): Secondary | ICD-10-CM | POA: Diagnosis not present

## 2022-05-13 DIAGNOSIS — R269 Unspecified abnormalities of gait and mobility: Secondary | ICD-10-CM

## 2022-05-13 DIAGNOSIS — R2689 Other abnormalities of gait and mobility: Secondary | ICD-10-CM

## 2022-05-13 DIAGNOSIS — R262 Difficulty in walking, not elsewhere classified: Secondary | ICD-10-CM

## 2022-05-13 NOTE — Therapy (Signed)
OUTPATIENT PHYSICAL THERAPY NEURO TREATMENT   Patient Name: Christopher [name] MRN: [id number] DOB:06/21/1951, 71 y.o., male Today's Date: 05/13/2022   PCP: Miller, Mark F, MD REFERRING PROVIDER: Miller, Mark F, MD   PT End of Session - 05/13/22 0941     Visit Number 12    Number of Visits 24    Date for PT Re-Evaluation 06/14/22    Authorization Type [employer] Medicare    Authorization Time Period 03/22/22-06/14/22    Progress Note Due on Visit 20    PT Start Time 0931    PT Stop Time 1011    PT Time Calculation (min) 40 min    Equipment Utilized During Treatment Gait belt    Activity Tolerance Patient tolerated treatment well;No increased pain    Behavior During Therapy WFL for tasks assessed/performed                 Past Medical History:  Diagnosis Date   Anxiety    CHF (congestive heart failure) (HCC)    COPD (chronic obstructive pulmonary disease) (HCC)    Depression    Dysrhythmia    Hyperlipidemia    Hypertension    Hypothyroidism    Sleep apnea    Past Surgical History:  Procedure Laterality Date   FRACTURE SURGERY     ORIF ANKLE FRACTURE Left 12/17/2020   Procedure: OPEN REDUCTION INTERNAL FIXATION (ORIF) ANKLE FRACTURE;  Surgeon: Fowler, Justin, DPM;  Location: [facility] ORS;  Service: Podiatry;  Laterality: Left;   Patient Active Problem List   Diagnosis Date Noted   Generalized weakness 10/16/2021   Chronic respiratory failure with hypoxia (HCC) 10/16/2021   Hypotension    SIRS (systemic inflammatory response syndrome) (HCC)    AF (paroxysmal atrial fibrillation) (HCC)    Syncope    Severe sepsis (HCC) 05/20/2021   Pressure injury of skin 04/25/2021   Acute respiratory failure (HCC) 04/21/2021   Acute kidney injury superimposed on CKD (HCC) 04/21/2021   Stage 3b chronic kidney disease (CKD) (HCC) 03/22/2021   Leukocytosis 03/22/2021   Osteomyelitis of ankle or foot, acute, left (HCC) 03/17/2021   Acute on chronic respiratory failure (HCC) 03/17/2021    Osteomyelitis (HCC) 12/16/2020   Depression 12/16/2020   Medicare annual wellness visit, initial 11/14/2019   B12 deficiency 11/14/2019   Hyperlipidemia 10/31/2019   Primary osteoarthritis of right knee 03/16/2019   Sleep apnea 10/31/2018   Traumatic incomplete tear of left rotator cuff 10/02/2018   Morbid obesity with BMI of 45.0-49.9, adult (HCC) 09/20/2018   Hypothyroidism 09/20/2018   Chronic diastolic CHF (congestive heart failure) (HCC) 09/20/2018   Benign essential hypertension 09/20/2018    ONSET DATE: 10/16/20  REFERRING DIAG: R27.0 (ICD-10-CM) - Ataxia, unspecified  THERAPY DIAG:  Muscle weakness (generalized)  Difficulty in walking, not elsewhere classified  Other abnormalities of gait and mobility  Unsteadiness on feet  Abnormality of gait and mobility  Rationale for Evaluation and Treatment Rehabilitation  SUBJECTIVE:                                                                                                                                                                                                SUBJECTIVE STATEMENT: No updates since last session. A little soreness after last PT session, not too bad.    Pt accompanied by: family member DTR  PERTINENT HISTORY: Pt is s/p ankle fracture February 2022. Patient reports independence with most activities prior to initial injury of his ankle.  [name] to STR at [facility], then OPPT at [facility]. Pt has struggled to return to premorbid status. Patient recently started medication for new depression which began after wife passing away this year.  Depression has been a barrier to compliance with HEP in home. Pt reports progressive deconditioning 2/2 falls anxiety.  PAIN:  Are you having pain? No  PRECAUTIONS: Fall  WEIGHT BEARING RESTRICTIONS No  LIVING ENVIRONMENT: Lives with: lives with their family Lives in: House/apartment Stairs: Yes: External: 1 steps; none 4 steps in garage with  bilateral rails that are difficulty to grip.  Has following equipment at home: Gilford Rile - 2 wheeled, Environmental consultant - 4 wheeled, and shower chair  PLOF: Independent with household mobility with device, Independent with homemaking with ambulation, Independent with gait, and Leisure: making rings out of coins   PATIENT GOALS Improve ability to cook, be able to access farmer's market independently. Be able to go into restaurants.     TODAY'S TREATMENT: 05/13/22 Physical therapy treatment session today consisted of completing assessment of goals and administration of testing as demonstrated in flow sheet and goals section of this note. Addition treatments may be found below.     INTERVENTION THIS DATE:   -STS hands free, elevated plinth + self basketball toss/catch 1x6   -STS 1 hand push off, basket ball toss to DTR/catch 1x6 (minGuardA)    -8X STS elevated plinth BRW     -WC propulsion to // bars ~26f    -AMB fwd/backward in // bars x2 (c STS)  *seated recovery   AMB fwd/backward in // bars x2 (c STS)  *seated recovery   -AMB fwd/backward in // bars x2 (c STS)  *seated recovery   -AMB fwd/backward in // bars x2.5 (c STS)  *seated recovery    -Standing in // bars 360degree circles 1x CW, 1x CCW (c STS)     -Adjusment of WC breaks which were no longer arresting movement during transfers; education on obtaining correct lock nut replacement and 2 plastic spaces for Left wheel lock lever. Lost previously and replaced by family by a square nut that was not staying tight    PATIENT EDUCATION: Education details: Pt educated throughout session about proper posture and technique with exercises. Improved exercise technique, movement at target joints, use of target muscles after min to mod verbal, visual, tactile cues.  Person educated: Patient, DTR Education method: Explanation, Demonstration, and Verbal cues Education comprehension: verbalized understanding, returned demonstration, verbal cues  required, and needs further education   HOME EXERCISE PROGRAM: No updates today, pt to continue HEP as previously given Access Code: [email or web address]: [email or web address] Date: 03/26/2022 Prepared by: CRivka Barbara Exercises - Seated March  - 1 x daily - 7 x weekly - 2 sets - 10 reps - Seated Long Arc Quad  - 1 x daily - 7 x weekly - 2 sets - 10 reps - Seated single leg step to side   - 1 x daily - 7 x weekly - 2 sets - 10 reps - Sit to Stand with Counter Support  - 1 x daily - 7 x weekly - 1 sets - 5 reps  GOALS Goals reviewed with patient? Yes  SHORT TERM GOALS: Target date: 06/02/2022    Patient will be independent in home exercise program to improve strength/mobility for better functional independence with ADLs. Baseline: No HEP currently 9/13: Pt not compliant with HEP nut doing some of the exercises intermittently  Goal status: NOT MET  LONG TERM GOALS: Target date: 06/14/2022 1.  Patient (> 60 years old) will complete five times sit to stand test in < 15 seconds without assistance from walker for standing balance indicating an increased LE strength and improved balance. Baseline: 18.77 with significant UE use and with walker for balance 16.96 sec with UE assist on walker Goal status: IN PROGRESS  2.  Patient will increase FOTO score to equal to or greater than  57   to demonstrate statistically significant improvement in mobility and quality of life.  Baseline: 47 9/13: 67 Goal status: MET     3.   Patient will reduce timed up and go to <11 seconds to reduce fall risk and demonstrate improved transfer/gait ability. Baseline: 76.68 9/13:51.07 sec Goal status: IN PROGRESS  4.   Patient will increase 10 meter walk test to >.75m/s as to improve gait speed for better community ambulation and to reduce fall risk. Baseline: 8/4: 21.36 sec (.47m/s) 9/13:22.93 Goal status: IN PROGRESS  5.  Pt will stand independently without UE assist and without LOB for 3  min in order to indicate improved standing endurance and LE strength for cooking and meal prep activities .  Baseline: 50 seconds 9/13: 55 seconds no UE, 1:30 with UE assist  Goal status: IN PROGRESS       ASSESSMENT:  CLINICAL IMPRESSION: Continued with repeat transfers and strengthening interventions today. Breaks for exertion applied judiciously. DTR attends session throughout, provides copious hydration opportunities for patient and elects to check his O2 sats and HR after each exercise each time, author informed her this is not necessary, but it seems to bring her comfort to have awareness of vitals response throughout. Pt continues to progress toward goals overall and generalized improvements in activity tolerance are noted.    OBJECTIVE IMPAIRMENTS Abnormal gait, decreased activity tolerance, decreased balance, decreased mobility, difficulty walking, decreased strength, impaired perceived functional ability, and impaired flexibility.   ACTIVITY LIMITATIONS bending, standing, squatting, stairs, transfers, and locomotion level  PARTICIPATION LIMITATIONS: meal prep, cleaning, laundry, medication management, driving, and community activity  PERSONAL FACTORS Age and 3+ comorbidities: CHF, COPD, HLD, HTN  are also affecting patient's functional outcome.   REHAB POTENTIAL: Fair very deconditioned and has had significant therapy to this point. Also his motivation for HEPcompletion is low.   CLINICAL DECISION MAKING: Evolving/moderate complexity  EVALUATION COMPLEXITY: Moderate  PLAN: PT FREQUENCY: 2x/week  PT DURATION: 12 weeks  PLANNED INTERVENTIONS: Therapeutic exercises, Therapeutic activity, Neuromuscular re-education, Balance training, Gait training, Patient/Family education, Self Care, Joint mobilization, Dry Needling, and Manual therapy  PLAN FOR NEXT SESSION: Continue with standing and walking tasks due to LE weakness and fatigue, nustep continue plan   Allan C Buccola  PT  9:47 AM, 05/13/22 Allan C Buccola, PT, DPT Physical Therapist - [facility]  [phone] (ASCOM)    05/13/2022, 9:47 AM 

## 2022-05-18 ENCOUNTER — Ambulatory Visit: Payer: Medicare Other

## 2022-05-18 DIAGNOSIS — R2689 Other abnormalities of gait and mobility: Secondary | ICD-10-CM

## 2022-05-18 DIAGNOSIS — R262 Difficulty in walking, not elsewhere classified: Secondary | ICD-10-CM

## 2022-05-18 DIAGNOSIS — M6281 Muscle weakness (generalized): Secondary | ICD-10-CM

## 2022-05-18 DIAGNOSIS — R2681 Unsteadiness on feet: Secondary | ICD-10-CM

## 2022-05-18 NOTE — Therapy (Signed)
OUTPATIENT PHYSICAL THERAPY NEURO TREATMENT   Patient Name: Christopher [name] MRN: [id number] DOB:01-02-1951, 71 y.o., male Today's Date: 05/18/2022   PCP: Rusty Aus, MD REFERRING PROVIDER: Rusty Aus, MD   PT End of Session - 05/18/22 0956     Visit Number 13    Number of Visits 24    Date for PT Re-Evaluation 06/14/22    Authorization Type [employer] Medicare    Authorization Time Period 03/22/22-06/14/22    Progress Note Due on Visit 20    PT Start Time 0938    PT Stop Time 1016    PT Time Calculation (min) 38 min    Equipment Utilized During Treatment Gait belt    Activity Tolerance Patient tolerated treatment well;No increased pain    Behavior During Therapy WFL for tasks assessed/performed                 Past Medical History:  Diagnosis Date   Anxiety    CHF (congestive heart failure) (HCC)    COPD (chronic obstructive pulmonary disease) ([place])    Depression    Dysrhythmia    Hyperlipidemia    Hypertension    Hypothyroidism    Sleep apnea    Past Surgical History:  Procedure Laterality Date   FRACTURE SURGERY     ORIF ANKLE FRACTURE Left 12/17/2020   Procedure: OPEN REDUCTION INTERNAL FIXATION (ORIF) ANKLE FRACTURE;  Surgeon: Samara Deist, DPM;  Location: [facility] ORS;  Service: Podiatry;  Laterality: Left;   Patient Active Problem List   Diagnosis Date Noted   Generalized weakness 10/16/2021   Chronic respiratory failure with hypoxia (HCC) 10/16/2021   Hypotension    SIRS (systemic inflammatory response syndrome) (HCC)    AF (paroxysmal atrial fibrillation) ([place])    Syncope    Severe sepsis ([place]) 05/20/2021   Pressure injury of skin 04/25/2021   Acute respiratory failure ([place]) 04/21/2021   Acute kidney injury superimposed on CKD ([place]) 04/21/2021   Stage 3b chronic kidney disease (CKD) ([place]) 03/22/2021   Leukocytosis 03/22/2021   Osteomyelitis of ankle or foot, acute, left ([place]) 03/17/2021   Acute on chronic respiratory failure ([place]) 03/17/2021    Osteomyelitis ([place]) 12/16/2020   Depression 12/16/2020   Medicare annual wellness visit, initial 11/14/2019   B12 deficiency 11/14/2019   Hyperlipidemia 10/31/2019   Primary osteoarthritis of right knee 03/16/2019   Sleep apnea 10/31/2018   Traumatic incomplete tear of left rotator cuff 10/02/2018   Morbid obesity with BMI of 45.0-49.9, adult ([place]) 09/20/2018   Hypothyroidism 09/20/2018   Chronic diastolic CHF (congestive heart failure) ([place]) 09/20/2018   Benign essential hypertension 09/20/2018    ONSET DATE: 10/16/20  REFERRING DIAG: R27.0 (ICD-10-CM) - Ataxia, unspecified  THERAPY DIAG:  Muscle weakness (generalized)  Difficulty in walking, not elsewhere classified  Other abnormalities of gait and mobility  Unsteadiness on feet  Rationale for Evaluation and Treatment Rehabilitation  SUBJECTIVE:  SUBJECTIVE STATEMENT: No updates since last session. A little soreness after last PT session, not too bad.    Pt accompanied by: family member DTR  PERTINENT HISTORY: Pt is s/p ankle fracture February 2022. Patient reports independence with most activities prior to initial injury of his ankle.  [name] to STR at [facility], then OPPT at [facility]. Pt has struggled to return to premorbid status. Patient recently started medication for new depression which began after wife passing away this year.  Depression has been a barrier to compliance with HEP in home. Pt reports progressive deconditioning 2/2 falls anxiety.  PAIN:  Are you having pain? No  PRECAUTIONS: Fall  WEIGHT BEARING RESTRICTIONS No  LIVING ENVIRONMENT: Lives with: lives with their family Lives in: House/apartment Stairs: Yes: External: 1 steps; none 4 steps in garage with bilateral rails that are difficulty  to grip.  Has following equipment at home: Gilford Rile - 2 wheeled, Environmental consultant - 4 wheeled, and shower chair  PLOF: Independent with household mobility with device, Independent with homemaking with ambulation, Independent with gait, and Leisure: making rings out of coins   PATIENT GOALS Improve ability to cook, be able to access farmer's market independently. Be able to go into restaurants.     TODAY'S TREATMENT: 05/18/22 Physical therapy treatment session today consisted of completing assessment of goals and administration of testing as demonstrated in flow sheet and goals section of this note. Addition treatments may be found below.     INTERVENTION THIS DATE:   -Stand pivot from [facility] to elevated plinth    -STS from elevated table c ball toss/catch to DTR 5x STS + 2 tosses each   -STS from elevated table c ball toss/catch to DTR 6x STS + 3 tosses each   -STS from elevated table c ball toss/catch to DTR 6x STS + 4 tosses each ( last 2 at 45 degree angles)  -STS --> fwd AMB c BRW x13f, retro AMB c BRW, --> sit   -STS BRW AMB 265f turn and sit to WC   -STS BRW AMB [id number] and sit to WC   -STS BRW AMB [id number] and sit to WC  [facility] Education details: Pt educated throughout session about proper posture and technique with exercises. Improved exercise technique, movement at target joints, use of target muscles after min to mod verbal, visual, tactile cues.  Person educated: Patient, DTR Education method: Explanation, Demonstration, and Verbal cues Education comprehension: verbalized understanding, returned demonstration, verbal cues required, and needs further education   HOME EXERCISE PROGRAM: No updates today, pt to continue HEP as previously given Access Code: [email or web address]: [email or web address] Date: 03/26/2022 Prepared by: ChriRivka Barbaraercises - Seated March  - 1 x daily - 7 x weekly - 2 sets - 10 reps - Seated Long Arc Quad  - 1 x daily - 7 x  weekly - 2 sets - 10 reps - Seated single leg step to side   - 1 x daily - 7 x weekly - 2 sets - 10 reps - Sit to Stand with Counter Support  - 1 x daily - 7 x weekly - 1 sets - 5 reps  GOALS Goals reviewed with patient? Yes  SHORT TERM GOALS: Target date: 06/02/2022    Patient will be independent in home exercise program to improve strength/mobility for better functional independence with ADLs. Baseline: No HEP currently 9/13: Pt not compliant with HEP nut doing some of the exercises intermittently  Goal status: NOT MET  LONG TERM GOALS: Target date: 06/14/2022 1.  Patient (> 50 years old) will complete five times sit to stand test in < 15 seconds without assistance from walker for standing balance indicating an increased LE strength and improved balance. Baseline: 18.77 with significant UE use and with walker for balance 16.96 sec with UE assist on walker Goal status: IN PROGRESS  2.  Patient will increase FOTO score to equal to or greater than  57   to demonstrate statistically significant improvement in mobility and quality of life.  Baseline: 47 9/13: 67 Goal status: MET     3.   Patient will reduce timed up and go to <11 seconds to reduce fall risk and demonstrate improved transfer/gait ability. Baseline: 76.68 9/13:51.07 sec Goal status: IN PROGRESS  4.   Patient will increase 10 meter walk test to >.33ms as to improve gait speed for better community ambulation and to reduce fall risk. Baseline: 8/4: 21.36 sec (.436m) 9/13:22.93 Goal status: IN PROGRESS  5.  Pt will stand independently without UE assist and without LOB for 3 min in order to indicate improved standing endurance and LE strength for cooking and meal prep activities .  Baseline: 50 seconds 9/13: 55 seconds no UE, 1:30 with UE assist  Goal status: IN PROGRESS       ASSESSMENT:  CLINICAL IMPRESSION: Continued with repeat transfers and strengthening interventions today. Breaks for exertion applied  judiciously. DTR attends session throughout, provides copious hydration opportunities for patient and elects to check his O2 sats and HR after each exercise each time, she likes to time his seated recovery. Pt continues to progress toward goals overall and generalized improvements in activity tolerance are noted.    OBJECTIVE IMPAIRMENTS Abnormal gait, decreased activity tolerance, decreased balance, decreased mobility, difficulty walking, decreased strength, impaired perceived functional ability, and impaired flexibility.   ACTIVITY LIMITATIONS bending, standing, squatting, stairs, transfers, and locomotion level  PARTICIPATION LIMITATIONS: meal prep, cleaning, laundry, medication management, driving, and community activity  PERSONAL FACTORS Age and 3+ comorbidities: CHF, COPD, HLD, HTN  are also affecting patient's functional outcome.   REHAB POTENTIAL: Fair very deconditioned and has had significant therapy to this point. Also his motivation for HEPcompletion is low.   CLINICAL DECISION MAKING: Evolving/moderate complexity  EVALUATION COMPLEXITY: Moderate  PLAN: PT FREQUENCY: 2x/week  PT DURATION: 12 weeks  PLANNED INTERVENTIONS: Therapeutic exercises, Therapeutic activity, Neuromuscular re-education, Balance training, Gait training, Patient/Family education, Self Care, Joint mobilization, Dry Needling, and Manual therapy  PLAN FOR NEXT SESSION: Continue with standing and walking tasks due to LE weakness and fatigue, nustep continue plan   AlEtta GrandchildT  10:03 AM, 05/18/22 AlEtta GrandchildPT, DPT Physical Therapist - [id number] [place]   05/18/2022, 10:03 AM

## 2022-05-25 ENCOUNTER — Ambulatory Visit: Payer: Medicare Other | Attending: Internal Medicine

## 2022-05-25 DIAGNOSIS — R269 Unspecified abnormalities of gait and mobility: Secondary | ICD-10-CM | POA: Diagnosis present

## 2022-05-25 DIAGNOSIS — R2681 Unsteadiness on feet: Secondary | ICD-10-CM | POA: Insufficient documentation

## 2022-05-25 DIAGNOSIS — R2689 Other abnormalities of gait and mobility: Secondary | ICD-10-CM | POA: Insufficient documentation

## 2022-05-25 DIAGNOSIS — M6281 Muscle weakness (generalized): Secondary | ICD-10-CM | POA: Diagnosis not present

## 2022-05-25 DIAGNOSIS — R262 Difficulty in walking, not elsewhere classified: Secondary | ICD-10-CM | POA: Diagnosis present

## 2022-05-25 NOTE — Therapy (Signed)
OUTPATIENT PHYSICAL THERAPY NEURO TREATMENT   Patient Name: Christopher [name] MRN: [id number] DOB:February 07, 1951, 71 y.o., male Today's Date: 05/25/2022   PCP: Christopher Aus, MD REFERRING PROVIDER: Rusty Aus, MD   PT End of Session - 05/25/22 1142     Visit Number 14    Number of Visits 24    Date for PT Re-Evaluation 06/14/22    Authorization Type [employer] Medicare    Authorization Time Period 03/22/22-06/14/22    Progress Note Due on Visit 20    PT Start Time 1145    PT Stop Time 1230    PT Time Calculation (min) 45 min    Equipment Utilized During Treatment Gait belt    Activity Tolerance Patient tolerated treatment well;No increased pain    Behavior During Therapy WFL for tasks assessed/performed                  Past Medical History:  Diagnosis Date   Anxiety    CHF (congestive heart failure) (HCC)    COPD (chronic obstructive pulmonary disease) ([place])    Depression    Dysrhythmia    Hyperlipidemia    Hypertension    Hypothyroidism    Sleep apnea    Past Surgical History:  Procedure Laterality Date   FRACTURE SURGERY     ORIF ANKLE FRACTURE Left 12/17/2020   Procedure: OPEN REDUCTION INTERNAL FIXATION (ORIF) ANKLE FRACTURE;  Surgeon: Christopher [name], DPM;  Location: [facility] ORS;  Service: Podiatry;  Laterality: Left;   Patient Active Problem List   Diagnosis Date Noted   Generalized weakness 10/16/2021   Chronic respiratory failure with hypoxia (HCC) 10/16/2021   Hypotension    SIRS (systemic inflammatory response syndrome) (HCC)    AF (paroxysmal atrial fibrillation) ([place])    Syncope    Severe sepsis ([place]) 05/20/2021   Pressure injury of skin 04/25/2021   Acute respiratory failure ([place]) 04/21/2021   Acute kidney injury superimposed on CKD ([place]) 04/21/2021   Stage 3b chronic kidney disease (CKD) ([place]) 03/22/2021   Leukocytosis 03/22/2021   Osteomyelitis of ankle or foot, acute, left ([place]) 03/17/2021   Acute on chronic respiratory failure ([place]) 03/17/2021    Osteomyelitis ([place]) 12/16/2020   Depression 12/16/2020   Medicare annual wellness visit, initial 11/14/2019   B12 deficiency 11/14/2019   Hyperlipidemia 10/31/2019   Primary osteoarthritis of right knee 03/16/2019   Sleep apnea 10/31/2018   Traumatic incomplete tear of left rotator cuff 10/02/2018   Morbid obesity with BMI of 45.0-49.9, adult ([place]) 09/20/2018   Hypothyroidism 09/20/2018   Chronic diastolic CHF (congestive heart failure) ([place]) 09/20/2018   Benign essential hypertension 09/20/2018    ONSET DATE: 10/16/20  REFERRING DIAG: R27.0 (ICD-10-CM) - Ataxia, unspecified  THERAPY DIAG:  Muscle weakness (generalized)  Difficulty in walking, not elsewhere classified  Other abnormalities of gait and mobility  Unsteadiness on feet  Abnormality of gait and mobility  Rationale for Evaluation and Treatment Rehabilitation  SUBJECTIVE:  SUBJECTIVE STATEMENT: No pain, falls or LOB since last session. Continues to have difficulty walking and prolonged standing. Reports improvement in STS transfers. Daughter reports he needs to work on endurance as he spends the majority of his day in the chair all day.   Pt accompanied by: family member DTR Christopher [name])  PERTINENT HISTORY: Pt is s/p ankle fracture February 2022. Patient reports independence with most activities prior to initial injury of his ankle.  [name] to STR at [facility], then OPPT at [facility]. Pt has struggled to return to premorbid status. Patient recently started medication for new depression which began after wife passing away this year.  Depression has been a barrier to compliance with HEP in home. Pt reports progressive deconditioning 2/2 falls anxiety.  PAIN:  Are you having pain? No  PRECAUTIONS: Fall  WEIGHT  BEARING RESTRICTIONS No  LIVING ENVIRONMENT: Lives with: lives with their family Lives in: House/apartment Stairs: Yes: External: 1 steps; none 4 steps in garage with bilateral rails that are difficulty to grip.  Has following equipment at home: Gilford Rile - 2 wheeled, Environmental consultant - 4 wheeled, and shower chair  PLOF: Independent with household mobility with device, Independent with homemaking with ambulation, Independent with gait, and Leisure: making rings out of coins   PATIENT GOALS Improve ability to cook, be able to access farmer's market independently. Be able to go into restaurants.     TODAY'S TREATMENT: 05/25/22 Physical therapy treatment session today consisted of completing assessment of goals and administration of testing as demonstrated in flow sheet and goals section of this note. Addition treatments may be found below.     INTERVENTION THIS DATE:   1st circuit:  - STS from plinth 2x5  - standing marching 2x20' in BRW  HR elevated to 114bpm, SpO2 at 99% on RA    2nd circuit:  - seated LAQ 2.5# AW, 2x10  - seated marching 2x5#, 2x10  - STS from plinth x5  - standing marching x20' in BRW  HR elevated to 115bpm, SpO2 at 98% RA   Ambulates ~6f from plinth to treadmill with BRW  3rd circuit:  - standing hip ABD x5ea  - standing hip ext x5ea  - standing hip flexion x2ea  Required seated rest break after hip ABD/ext due to bil knee pain/weakness and fatigue   Ambulates ~269ffrom treadmill to // bars with BRW  In // bars:  - ambulation (FWD/BWD) x4 laps - HR 118bpm, SpO2 100% on RA   Seated rest break  - ambulation (side stepping) x2 laps - HR 129 bpm, SpO2 99% on RA   Seated rest break  - unsupported sitting with cross body punching, 2x45'   - unsupported sitting with cross body punching 1# DB, 2x45'  - forward ambulation in // bars to [facility] end of session    PATIENT EDUCATION: Education details: Pt educated throughout session about proper posture and technique  with exercises. Improved exercise technique, movement at target joints, use of target muscles after min to mod verbal, visual, tactile cues.  Person educated: Patient, DTR Education method: Explanation, Demonstration, and Verbal cues Education comprehension: verbalized understanding, returned demonstration, verbal cues required, and needs further education   HOME EXERCISE PROGRAM: No updates today, pt to continue HEP as previously given. Educated to set timer every 1-2hrs and complete walking/HEP for improved endurance Access Code: [email or web address]: [email or web address] Date: 03/26/2022 Prepared by: ChRivka BarbaraExercises - Seated March  - 1 x daily - 7  x weekly - 2 sets - 10 reps - Seated Long Arc Quad  - 1 x daily - 7 x weekly - 2 sets - 10 reps - Seated single leg step to side   - 1 x daily - 7 x weekly - 2 sets - 10 reps - Sit to Stand with Counter Support  - 1 x daily - 7 x weekly - 1 sets - 5 reps  GOALS Goals reviewed with patient? Yes  SHORT TERM GOALS: Target date: 06/02/2022    Patient will be independent in home exercise program to improve strength/mobility for better functional independence with ADLs. Baseline: No HEP currently 9/13: Pt not compliant with HEP nut doing some of the exercises intermittently  Goal status: NOT MET  LONG TERM GOALS: Target date: 06/14/2022 1.  Patient (> 76 years old) will complete five times sit to stand test in < 15 seconds without assistance from walker for standing balance indicating an increased LE strength and improved balance. Baseline: 18.77 with significant UE use and with walker for balance 16.96 sec with UE assist on walker Goal status: IN PROGRESS  2.  Patient will increase FOTO score to equal to or greater than  57   to demonstrate statistically significant improvement in mobility and quality of life.  Baseline: 47 9/13: 67 Goal status: MET     3.   Patient will reduce timed up and go to <11 seconds to  reduce fall risk and demonstrate improved transfer/gait ability. Baseline: 76.68 9/13:51.07 sec Goal status: IN PROGRESS  4.   Patient will increase 10 meter walk test to >.40ms as to improve gait speed for better community ambulation and to reduce fall risk. Baseline: 8/4: 21.36 sec (.462m) 9/13:22.93 Goal status: IN PROGRESS  5.  Pt will stand independently without UE assist and without LOB for 3 min in order to indicate improved standing endurance and LE strength for cooking and meal prep activities .  Baseline: 50 seconds 9/13: 55 seconds no UE, 1:30 with UE assist  Goal status: IN PROGRESS       ASSESSMENT:  CLINICAL IMPRESSION: Pt tolerated treatment fair with increased rest breaks and HR elevated to 129bpm; SpO2 >/= 99%. HR with good recovery during seated rest break and cues for pursed lip breathing. Interventions performed in circuits today to address CV endurance. Increased difficulty with standing hip exercises secondary to c/o knee pain, limiting number of repetitions. Increased duration and frequency of seated rest breaks during standing/gait interventions in // bars. Increased gait deviations (decreased foot clearance and step length) and increased BUE support on BRW and // bars with fatigue. Session concluded with unsupported sitting BUE exercise to further address CV endurance due to BLE fatigue. C/o chronic N/T in L digits 2-4 could be secondary to neural tension; plan to investigate further. Pt and daughter educated to set timer at home to perform ambulation/HEP as pt has not been compliant with HEP secondary to motivation. Pt will continue to benefit from skilled OPPT services to address deficits for improved endurance/safety with functional mobility.    OBJECTIVE IMPAIRMENTS Abnormal gait, decreased activity tolerance, decreased balance, decreased mobility, difficulty walking, decreased strength, impaired perceived functional ability, and impaired flexibility.    ACTIVITY LIMITATIONS bending, standing, squatting, stairs, transfers, and locomotion level  PARTICIPATION LIMITATIONS: meal prep, cleaning, laundry, medication management, driving, and community activity  PERSONAL FACTORS Age and 3+ comorbidities: CHF, COPD, HLD, HTN  are also affecting patient's functional outcome.   REHAB POTENTIAL: Fair very deconditioned  and has had significant therapy to this point. Also his motivation for HEPcompletion is low.   CLINICAL DECISION MAKING: Evolving/moderate complexity  EVALUATION COMPLEXITY: Moderate  PLAN: PT FREQUENCY: 2x/week  PT DURATION: 12 weeks  PLANNED INTERVENTIONS: Therapeutic exercises, Therapeutic activity, Neuromuscular re-education, Balance training, Gait training, Patient/Family education, Self Care, Joint mobilization, Dry Needling, and Manual therapy  PLAN FOR NEXT SESSION: Continue with standing and walking tasks due to LE weakness and fatigue, nustep, hip ABD interventions, UB conditioning, address LUE neural tension, continue plan    Herminio Commons, PT, DPT 12:48 PM,05/25/22 Physical Therapist - [facility]

## 2022-06-01 ENCOUNTER — Ambulatory Visit: Payer: Medicare Other

## 2022-06-01 DIAGNOSIS — M6281 Muscle weakness (generalized): Secondary | ICD-10-CM

## 2022-06-01 DIAGNOSIS — R2681 Unsteadiness on feet: Secondary | ICD-10-CM

## 2022-06-01 DIAGNOSIS — R269 Unspecified abnormalities of gait and mobility: Secondary | ICD-10-CM

## 2022-06-01 DIAGNOSIS — R262 Difficulty in walking, not elsewhere classified: Secondary | ICD-10-CM

## 2022-06-01 DIAGNOSIS — R2689 Other abnormalities of gait and mobility: Secondary | ICD-10-CM

## 2022-06-01 NOTE — Therapy (Signed)
OUTPATIENT PHYSICAL THERAPY NEURO TREATMENT   Patient Name: Christopher [name] MRN: [id number] DOB:1951/07/06, 71 y.o., male Today's Date: 06/01/2022   PCP: Christopher Aus, MD REFERRING PROVIDER: Rusty Aus, MD   PT End of Session - 06/01/22 1149     Visit Number 15    Number of Visits 24    Date for PT Re-Evaluation 06/14/22    Authorization Type UHC Medicare    Authorization Time Period 03/22/22-06/14/22    Progress Note Due on Visit 20    PT Start Time 1150    PT Stop Time 1228    PT Time Calculation (min) 38 min    Equipment Utilized During Treatment Gait belt    Activity Tolerance Patient tolerated treatment well;No increased pain    Behavior During Therapy WFL for tasks assessed/performed                   Past Medical History:  Diagnosis Date   Anxiety    CHF (congestive heart failure) (HCC)    COPD (chronic obstructive pulmonary disease) ([place])    Depression    Dysrhythmia    Hyperlipidemia    Hypertension    Hypothyroidism    Sleep apnea    Past Surgical History:  Procedure Laterality Date   FRACTURE SURGERY     ORIF ANKLE FRACTURE Left 12/17/2020   Procedure: OPEN REDUCTION INTERNAL FIXATION (ORIF) ANKLE FRACTURE;  Surgeon: Christopher [name], DPM;  Location: [facility] ORS;  Service: Podiatry;  Laterality: Left;   Patient Active Problem List   Diagnosis Date Noted   Generalized weakness 10/16/2021   Chronic respiratory failure with hypoxia (HCC) 10/16/2021   Hypotension    SIRS (systemic inflammatory response syndrome) (HCC)    AF (paroxysmal atrial fibrillation) ([place])    Syncope    Severe sepsis ([place]) 05/20/2021   Pressure injury of skin 04/25/2021   Acute respiratory failure ([place]) 04/21/2021   Acute kidney injury superimposed on CKD ([place]) 04/21/2021   Stage 3b chronic kidney disease (CKD) ([place]) 03/22/2021   Leukocytosis 03/22/2021   Osteomyelitis of ankle or foot, acute, left ([place]) 03/17/2021   Acute on chronic respiratory failure ([place])  03/17/2021   Osteomyelitis ([place]) 12/16/2020   Depression 12/16/2020   Medicare annual wellness visit, initial 11/14/2019   B12 deficiency 11/14/2019   Hyperlipidemia 10/31/2019   Primary osteoarthritis of right knee 03/16/2019   Sleep apnea 10/31/2018   Traumatic incomplete tear of left rotator cuff 10/02/2018   Morbid obesity with BMI of 45.0-49.9, adult ([place]) 09/20/2018   Hypothyroidism 09/20/2018   Chronic diastolic CHF (congestive heart failure) ([place]) 09/20/2018   Benign essential hypertension 09/20/2018    ONSET DATE: 10/16/20  REFERRING DIAG: R27.0 (ICD-10-CM) - Ataxia, unspecified  THERAPY DIAG:  Muscle weakness (generalized)  Difficulty in walking, not elsewhere classified  Other abnormalities of gait and mobility  Unsteadiness on feet  Abnormality of gait and mobility  Rationale for Evaluation and Treatment Rehabilitation  SUBJECTIVE:  SUBJECTIVE STATEMENT: Feeling okay today, no pain. Daughter reports that pt was tired post session; increased c/o soreness in the arms a few days after. Did not complete HEP, but continued to get up with "purpose."  No pain, falls or LOB since last session. Continues to have difficulty walking and prolonged standing. Reports improvement in STS transfers. Daughter reports he needs to work on endurance as he spends the majority of his day in the chair all day.   Pt accompanied by: family member DTR Christopher [name])  PERTINENT HISTORY: Pt is s/p ankle fracture February 2022. Patient reports independence with most activities prior to initial injury of his ankle.  [name] to STR at [employer], then OPPT at [facility]. Pt has struggled to return to premorbid status. Patient recently started medication for new depression which began after wife  passing away this year.  Depression has been a barrier to compliance with HEP in home. Pt reports progressive deconditioning 2/2 falls anxiety.  PAIN:  Are you having pain? No  PRECAUTIONS: Fall  WEIGHT BEARING RESTRICTIONS No  LIVING ENVIRONMENT: Lives with: lives with their family Lives in: House/apartment Stairs: Yes: External: 1 steps; none 4 steps in garage with bilateral rails that are difficulty to grip.  Has following equipment at home: Gilford Rile - 2 wheeled, Environmental consultant - 4 wheeled, and shower chair  PLOF: Independent with household mobility with device, Independent with homemaking with ambulation, Independent with gait, and Leisure: making rings out of coins   PATIENT GOALS Improve ability to cook, be able to access farmer's market independently. Be able to go into restaurants.     TODAY'S TREATMENT: 06/01/22 Physical therapy treatment session today consisted of completing assessment of goals and administration of testing as demonstrated in flow sheet and goals section of this note. Addition treatments may be found below.      INTERVENTION THIS DATE:   Beginning: 96% on RA, 91 bpm   1st circuit:  - STS from plinth x5  - standing marching x20' in BRW  HR elevated to 98bpm, SpO2 at 96% on RA  - STS from plinth x5  - standing marching x20' in BRW  HR elevated to 80bpm, SpO2 at 98% on RA    2nd circuit:  - seated LAQ 2.5# AW, x20  - seated marching 2.5#, x20  HR elevated to 113bpm, SpO2 at 98% RA   3rd circuit:  - seated hip ABD x20 with blue TB  - seated hip flexion x20 with blue TB  - seated isometric hip ext with feet on 6" step (cues to push foot down towards floor), x20ea  HR elevated to 112bpm, SpO2 at 98% RA   Ambulates ~82f from plinth to // bars with BRW  HR elevated to 119bpm, SpO2 at 97% RA   - unsupported sitting with alternating cross body punching (eye level, diagonal both directions, OH) x10ea  - unsupported sitting median nerve glide (shoulder abd at  90deg, wrist flex/ext with cervical lateral flexion), x10ea - AAROM for correct performance with max verbal cues     Not performed today:  In // bars:  - ambulation (FWD/BWD) x4 laps - HR 118bpm, SpO2 100% on RA   Seated rest break  - ambulation (side stepping) x2 laps - HR 129 bpm, SpO2 99% on RA   Seated rest break  - unsupported sitting with cross body punching 1# DB, 2x45'  - forward ambulation in // bars to [facility] end of session    PATIENT  EDUCATION: Education details: Pt educated throughout session about proper posture and technique with exercises. Improved exercise technique, movement at target joints, use of target muscles after min to mod verbal, visual, tactile cues.  Person educated: Patient, DTR Education method: Explanation, Demonstration, and Verbal cues Education comprehension: verbalized understanding, returned demonstration, verbal cues required, and needs further education   HOME EXERCISE PROGRAM: No updates today, pt to continue HEP as previously given. Educated to set timer every 1-2hrs and complete walking/HEP for improved endurance Access Code: [email or web address] URL: [email or web address] Date: 03/26/2022 Prepared by: Rivka Barbara  Exercises - Seated March  - 1 x daily - 7 x weekly - 2 sets - 10 reps - Seated Long Arc Quad  - 1 x daily - 7 x weekly - 2 sets - 10 reps - Seated single leg step to side   - 1 x daily - 7 x weekly - 2 sets - 10 reps - Sit to Stand with Counter Support  - 1 x daily - 7 x weekly - 1 sets - 5 reps  GOALS Goals reviewed with patient? Yes  SHORT TERM GOALS: Target date: 06/02/2022    Patient will be independent in home exercise program to improve strength/mobility for better functional independence with ADLs. Baseline: No HEP currently 9/13: Pt not compliant with HEP nut doing some of the exercises intermittently  Goal status: NOT MET  LONG TERM GOALS: Target date: 06/14/2022 1.  Patient (> 5 years old) will complete  five times sit to stand test in < 15 seconds without assistance from walker for standing balance indicating an increased LE strength and improved balance. Baseline: 18.77 with significant UE use and with walker for balance 16.96 sec with UE assist on walker Goal status: IN PROGRESS  2.  Patient will increase FOTO score to equal to or greater than  57   to demonstrate statistically significant improvement in mobility and quality of life.  Baseline: 47 9/13: 67 Goal status: MET     3.   Patient will reduce timed up and go to <11 seconds to reduce fall risk and demonstrate improved transfer/gait ability. Baseline: 76.68 9/13:51.07 sec Goal status: IN PROGRESS  4.   Patient will increase 10 meter walk test to >.18ms as to improve gait speed for better community ambulation and to reduce fall risk. Baseline: 8/4: 21.36 sec (.43m) 9/13:22.93 Goal status: IN PROGRESS  5.  Pt will stand independently without UE assist and without LOB for 3 min in order to indicate improved standing endurance and LE strength for cooking and meal prep activities .  Baseline: 50 seconds 9/13: 55 seconds no UE, 1:30 with UE assist  Goal status: IN PROGRESS       ASSESSMENT:  CLINICAL IMPRESSION: Pt tolerated treatment fair today; unable to progress circuits and required increased seated rest breaks secondary to BLE fatigue and increased tremoring with WB. Ambulation in // bars therefore deferred, with focus on UE circuit for CV endurance. HR continues to be elevated but in good submax target HR range (79-119bpm based on age). Pt tested positive for median and radial ULTT. Unable to report improvement during session which could be due to limited cervical AROM with lateral flexion. Will follow up with alternate nerve glide at next session as appropriate. Pt will continue to benefit from skilled OPPT services to improve safety/Ind with functional mobility.   OBJECTIVE IMPAIRMENTS Abnormal gait, decreased  activity tolerance, decreased balance, decreased mobility, difficulty walking, decreased strength, impaired perceived functional ability, and impaired  flexibility.   ACTIVITY LIMITATIONS bending, standing, squatting, stairs, transfers, and locomotion level  PARTICIPATION LIMITATIONS: meal prep, cleaning, laundry, medication management, driving, and community activity  PERSONAL FACTORS Age and 3+ comorbidities: CHF, COPD, HLD, HTN  are also affecting patient's functional outcome.   REHAB POTENTIAL: Fair very deconditioned and has had significant therapy to this point. Also his motivation for HEPcompletion is low.   CLINICAL DECISION MAKING: Evolving/moderate complexity  EVALUATION COMPLEXITY: Moderate  PLAN: PT FREQUENCY: 2x/week  PT DURATION: 12 weeks  PLANNED INTERVENTIONS: Therapeutic exercises, Therapeutic activity, Neuromuscular re-education, Balance training, Gait training, Patient/Family education, Self Care, Joint mobilization, Dry Needling, and Manual therapy  PLAN FOR NEXT SESSION: Continue with standing and walking tasks due to LE weakness and fatigue, nustep, hip ABD interventions, UB conditioning, address LUE neural tension, continue plan   Herminio Commons, PT, DPT 3:41 PM,06/01/22 Physical Therapist - [facility]

## 2022-06-03 ENCOUNTER — Ambulatory Visit: Payer: Medicare Other

## 2022-06-03 DIAGNOSIS — R2689 Other abnormalities of gait and mobility: Secondary | ICD-10-CM

## 2022-06-03 DIAGNOSIS — M6281 Muscle weakness (generalized): Secondary | ICD-10-CM | POA: Diagnosis not present

## 2022-06-03 DIAGNOSIS — R262 Difficulty in walking, not elsewhere classified: Secondary | ICD-10-CM

## 2022-06-03 NOTE — Therapy (Signed)
OUTPATIENT PHYSICAL THERAPY NEURO TREATMENT   Patient Name: Christopher [name] MRN: [id number] DOB:March 05, 1951, 71 y.o., male Today's Date: 06/03/2022   PCP: Rusty Aus, MD REFERRING PROVIDER: Rusty Aus, MD   PT End of Session - 06/03/22 1434     Visit Number 16    Number of Visits 24    Date for PT Re-Evaluation 06/14/22    Authorization Type UHC Medicare    Authorization Time Period 03/22/22-06/14/22    Progress Note Due on Visit 20    PT Start Time 2694    PT Stop Time 1514    PT Time Calculation (min) 40 min    Equipment Utilized During Treatment Gait belt    Activity Tolerance Patient tolerated treatment well;No increased pain    Behavior During Therapy WFL for tasks assessed/performed                   Past Medical History:  Diagnosis Date   Anxiety    CHF (congestive heart failure) (HCC)    COPD (chronic obstructive pulmonary disease) ([place])    Depression    Dysrhythmia    Hyperlipidemia    Hypertension    Hypothyroidism    Sleep apnea    Past Surgical History:  Procedure Laterality Date   FRACTURE SURGERY     ORIF ANKLE FRACTURE Left 12/17/2020   Procedure: OPEN REDUCTION INTERNAL FIXATION (ORIF) ANKLE FRACTURE;  Surgeon: Samara Deist, DPM;  Location: [facility] ORS;  Service: Podiatry;  Laterality: Left;   Patient Active Problem List   Diagnosis Date Noted   Generalized weakness 10/16/2021   Chronic respiratory failure with hypoxia (HCC) 10/16/2021   Hypotension    SIRS (systemic inflammatory response syndrome) (HCC)    AF (paroxysmal atrial fibrillation) ([place])    Syncope    Severe sepsis ([place]) 05/20/2021   Pressure injury of skin 04/25/2021   Acute respiratory failure ([place]) 04/21/2021   Acute kidney injury superimposed on CKD ([place]) 04/21/2021   Stage 3b chronic kidney disease (CKD) ([place]) 03/22/2021   Leukocytosis 03/22/2021   Osteomyelitis of ankle or foot, acute, left ([place]) 03/17/2021   Acute on chronic respiratory failure ([place])  03/17/2021   Osteomyelitis ([place]) 12/16/2020   Depression 12/16/2020   Medicare annual wellness visit, initial 11/14/2019   B12 deficiency 11/14/2019   Hyperlipidemia 10/31/2019   Primary osteoarthritis of right knee 03/16/2019   Sleep apnea 10/31/2018   Traumatic incomplete tear of left rotator cuff 10/02/2018   Morbid obesity with BMI of 45.0-49.9, adult ([place]) 09/20/2018   Hypothyroidism 09/20/2018   Chronic diastolic CHF (congestive heart failure) ([place]) 09/20/2018   Benign essential hypertension 09/20/2018    ONSET DATE: 10/16/20  REFERRING DIAG: R27.0 (ICD-10-CM) - Ataxia, unspecified  THERAPY DIAG:  Muscle weakness (generalized)  Difficulty in walking, not elsewhere classified  Other abnormalities of gait and mobility  Rationale for Evaluation and Treatment Rehabilitation  SUBJECTIVE:  SUBJECTIVE STATEMENT: Patient reports he is not the most compliant with HEP. Has been doing sit to stands.      Pt accompanied by: family member DTR ([name])  PERTINENT HISTORY: Pt is s/p ankle fracture February 2022. Patient reports independence with most activities prior to initial injury of his ankle.  [name] to STR at [facility] x 3M, then OPPT at [facility]. Pt has struggled to return to premorbid status. Patient recently started medication for new depression which began after wife passing away this year.  Depression has been a barrier to compliance with HEP in home. Pt reports progressive deconditioning 2/2 falls anxiety.  PAIN:  Are you having pain? No  PRECAUTIONS: Fall  WEIGHT BEARING RESTRICTIONS No  LIVING ENVIRONMENT: Lives with: lives with their family Lives in: House/apartment Stairs: Yes: External: 1 steps; none 4 steps in garage with bilateral rails that are  difficulty to grip.  Has following equipment at home: Walker - 2 wheeled, Walker - 4 wheeled, and shower chair  PLOF: Independent with household mobility with device, Independent with homemaking with ambulation, Independent with gait, and Leisure: making rings out of coins   PATIENT GOALS Improve ability to cook, be able to access farmer's market independently. Be able to go into restaurants.     TODAY'S TREATMENT: 06/03/22  Beginning: 96% on RA, 91 bpm  In // bars: Ambulate forward/backwards in // bars 4x length of // bars : HR 115; x 2 trials   2" step toe taps 10x each LE ; BUE support HR 110   2" step lateral toe taps 10x each LE; BUE support HR 116   Tandem stance 30 seconds each LE ; very challenging, requires rest break between interventions: HR 105  [place]o ball transfer standing in // bars x3 minutes HR 108\  Seated: Adduction ball squeeze 15x RTB hamstring curl 15x each LE RTB alternating LAQ 15x each LE  PATIENT EDUCATION: Education details: Pt educated throughout session about proper posture and technique with exercises. Improved exercise technique, movement at target joints, use of target muscles after min to mod verbal, visual, tactile cues.  Person educated: Patient, DTR Education method: Explanation, Demonstration, and Verbal cues Education comprehension: verbalized understanding, returned demonstration, verbal cues required, and needs further education   HOME EXERCISE PROGRAM: No updates today, pt to continue HEP as previously given. Educated to set timer every 1-2hrs and complete walking/HEP for improved endurance Access Code: [email or web address] URL: [email or web address] Date: 03/26/2022 Prepared by: Christopher Byrd  Exercises - Seated March  - 1 x daily - 7 x weekly - 2 sets - 10 reps - Seated Long Arc Quad  - 1 x daily - 7 x weekly - 2 sets - 10 reps - Seated single leg step to side   - 1 x daily - 7 x weekly - 2 sets - 10 reps - Sit to Stand  with Counter Support  - 1 x daily - 7 x weekly - 1 sets - 5 reps  GOALS Goals reviewed with patient? Yes  SHORT TERM GOALS: Target date: 06/02/2022    Patient will be independent in home exercise program to improve strength/mobility for better functional independence with ADLs. Baseline: No HEP currently 9/13: Pt not compliant with HEP nut doing some of the exercises intermittently  Goal status: NOT MET  LONG TERM GOALS: Target date: 06/14/2022 1.  Patient (> 60 years old) will complete five times sit to stand test in < 15 seconds without assistance from walker for standing   balance indicating an increased LE strength and improved balance. Baseline: 18.77 with significant UE use and with walker for balance 16.96 sec with UE assist on walker Goal status: IN PROGRESS  2.  Patient will increase FOTO score to equal to or greater than  57   to demonstrate statistically significant improvement in mobility and quality of life.  Baseline: 47 9/13: 67 Goal status: MET     3.   Patient will reduce timed up and go to <11 seconds to reduce fall risk and demonstrate improved transfer/gait ability. Baseline: 76.68 9/13:51.07 sec Goal status: IN PROGRESS  4.   Patient will increase 10 meter walk test to >.75m/s as to improve gait speed for better community ambulation and to reduce fall risk. Baseline: 8/4: 21.36 sec (.47m/s) 9/13:22.93 Goal status: IN PROGRESS  5.  Pt will stand independently without UE assist and without LOB for 3 min in order to indicate improved standing endurance and LE strength for cooking and meal prep activities .  Baseline: 50 seconds 9/13: 55 seconds no UE, 1:30 with UE assist  Goal status: IN PROGRESS       ASSESSMENT:  CLINICAL IMPRESSION: Patient encouraged to be compliant with HEP for maximal results from therapy. Need to remain compliant in order to continue with therapy. Patient requires intermittent rest breaks throughout session.  LLE fatigues quicker  than RLE this session. Pt will continue to benefit from skilled OPPT services to improve safety/Ind with functional mobility.   OBJECTIVE IMPAIRMENTS Abnormal gait, decreased activity tolerance, decreased balance, decreased mobility, difficulty walking, decreased strength, impaired perceived functional ability, and impaired flexibility.   ACTIVITY LIMITATIONS bending, standing, squatting, stairs, transfers, and locomotion level  PARTICIPATION LIMITATIONS: meal prep, cleaning, laundry, medication management, driving, and community activity  PERSONAL FACTORS Age and 3+ comorbidities: CHF, COPD, HLD, HTN  are also affecting patient's functional outcome.   REHAB POTENTIAL: Fair very deconditioned and has had significant therapy to this point. Also his motivation for HEPcompletion is low.   CLINICAL DECISION MAKING: Evolving/moderate complexity  EVALUATION COMPLEXITY: Moderate  PLAN: PT FREQUENCY: 2x/week  PT DURATION: 12 weeks  PLANNED INTERVENTIONS: Therapeutic exercises, Therapeutic activity, Neuromuscular re-education, Balance training, Gait training, Patient/Family education, Self Care, Joint mobilization, Dry Needling, and Manual therapy  PLAN FOR NEXT SESSION: Continue with standing and walking tasks due to LE weakness and fatigue, nustep, hip ABD interventions, UB conditioning, address LUE neural tension, continue plan   Marina Moser PT  4:10 PM,06/03/22 Physical Therapist - [employer] [facility]  

## 2022-06-08 ENCOUNTER — Ambulatory Visit: Payer: Medicare Other

## 2022-06-10 ENCOUNTER — Ambulatory Visit: Payer: Medicare Other

## 2022-06-10 DIAGNOSIS — M6281 Muscle weakness (generalized): Secondary | ICD-10-CM | POA: Diagnosis not present

## 2022-06-10 DIAGNOSIS — R2689 Other abnormalities of gait and mobility: Secondary | ICD-10-CM

## 2022-06-10 DIAGNOSIS — R269 Unspecified abnormalities of gait and mobility: Secondary | ICD-10-CM

## 2022-06-10 DIAGNOSIS — R262 Difficulty in walking, not elsewhere classified: Secondary | ICD-10-CM

## 2022-06-10 DIAGNOSIS — R2681 Unsteadiness on feet: Secondary | ICD-10-CM

## 2022-06-10 NOTE — Therapy (Signed)
OUTPATIENT PHYSICAL THERAPY NEURO TREATMENT   Patient Name: Christopher [name] MRN: [id number] DOB:1951/06/16, 71 y.o., male Today's Date: 06/10/2022   PCP: Rusty Aus, MD REFERRING PROVIDER: Rusty Aus, MD   PT End of Session - 06/10/22 0941     Visit Number 17    Number of Visits 24    Date for PT Re-Evaluation 06/14/22    Authorization Type UHC Medicare    Authorization Time Period 03/22/22-06/14/22    Progress Note Due on Visit 20    PT Start Time 0940    PT Stop Time 1015    PT Time Calculation (min) 35 min    Equipment Utilized During Treatment Gait belt    Activity Tolerance Patient tolerated treatment well;No increased pain    Behavior During Therapy WFL for tasks assessed/performed              Past Medical History:  Diagnosis Date   Anxiety    CHF (congestive heart failure) (HCC)    COPD (chronic obstructive pulmonary disease) ([place])    Depression    Dysrhythmia    Hyperlipidemia    Hypertension    Hypothyroidism    Sleep apnea    Past Surgical History:  Procedure Laterality Date   FRACTURE SURGERY     ORIF ANKLE FRACTURE Left 12/17/2020   Procedure: OPEN REDUCTION INTERNAL FIXATION (ORIF) ANKLE FRACTURE;  Surgeon: Samara Deist, DPM;  Location: [facility] ORS;  Service: Podiatry;  Laterality: Left;   Patient Active Problem List   Diagnosis Date Noted   Generalized weakness 10/16/2021   Chronic respiratory failure with hypoxia (HCC) 10/16/2021   Hypotension    SIRS (systemic inflammatory response syndrome) (HCC)    AF (paroxysmal atrial fibrillation) ([place])    Syncope    Severe sepsis ([place]) 05/20/2021   Pressure injury of skin 04/25/2021   Acute respiratory failure ([place]) 04/21/2021   Acute kidney injury superimposed on CKD ([place]) 04/21/2021   Stage 3b chronic kidney disease (CKD) ([place]) 03/22/2021   Leukocytosis 03/22/2021   Osteomyelitis of ankle or foot, acute, left ([place]) 03/17/2021   Acute on chronic respiratory failure ([place]) 03/17/2021    Osteomyelitis ([place]) 12/16/2020   Depression 12/16/2020   Medicare annual wellness visit, initial 11/14/2019   B12 deficiency 11/14/2019   Hyperlipidemia 10/31/2019   Primary osteoarthritis of right knee 03/16/2019   Sleep apnea 10/31/2018   Traumatic incomplete tear of left rotator cuff 10/02/2018   Morbid obesity with BMI of 45.0-49.9, adult ([place]) 09/20/2018   Hypothyroidism 09/20/2018   Chronic diastolic CHF (congestive heart failure) ([place]) 09/20/2018   Benign essential hypertension 09/20/2018    ONSET DATE: 10/16/20  REFERRING DIAG: R27.0 (ICD-10-CM) - Ataxia, unspecified  THERAPY DIAG:  Difficulty in walking, not elsewhere classified  Muscle weakness (generalized)  Other abnormalities of gait and mobility  Unsteadiness on feet  Abnormality of gait and mobility  Rationale for Evaluation and Treatment Rehabilitation  SUBJECTIVE:  SUBJECTIVE STATEMENT:  Pt reports no new complaints upon arrival to the clinic.  Pt states the exercises at home are coming along "very slowly".  Pt accompanied by: family member DTR [name])   PERTINENT HISTORY: Pt is s/p ankle fracture February 2022. Patient reports independence with most activities prior to initial injury of his ankle.  [name] to STR at [facility], then OPPT at [facility]. Pt has struggled to return to premorbid status. Patient recently started medication for new depression which began after wife passing away this year.  Depression has been a barrier to compliance with HEP in home. Pt reports progressive deconditioning 2/2 falls anxiety.  PAIN:  Are you having pain? No  PRECAUTIONS: Fall  WEIGHT BEARING RESTRICTIONS No  LIVING ENVIRONMENT: Lives with: lives with their family Lives in: House/apartment Stairs: Yes:  External: 1 steps; none 4 steps in garage with bilateral rails that are difficulty to grip.  Has following equipment at home: Gilford Rile - 2 wheeled, Environmental consultant - 4 wheeled, and shower chair  PLOF: Independent with household mobility with device, Independent with homemaking with ambulation, Independent with gait, and Leisure: making rings out of coins   PATIENT GOALS Improve ability to cook, be able to access farmer's market independently. Be able to go into restaurants.     TODAY'S TREATMENT: 06/10/22  Ambulation around the gym requiring 1 seated rest break and 1 standing rest break.    HR 124 at highest during session which occurred following STS  Seated:  LAQ, 2x10 Adduction ball squeeze 2x10- with 3 sec holds Seated hip abduction with BTB, 2x10 each LE BTB hamstring curl 15x each LE   PATIENT EDUCATION: Education details: Pt educated throughout session about proper posture and technique with exercises. Improved exercise technique, movement at target joints, use of target muscles after min to mod verbal, visual, tactile cues.  Person educated: Patient, DTR Education method: Explanation, Demonstration, and Verbal cues Education comprehension: verbalized understanding, returned demonstration, verbal cues required, and needs further education   HOME EXERCISE PROGRAM: No updates today, pt to continue HEP as previously given. Educated to set timer every 1-2hrs and complete walking/HEP for improved endurance Access Code: [email or web address] URL: [email or web address] Date: 03/26/2022 Prepared by: Rivka Barbara  Exercises - Seated March  - 1 x daily - 7 x weekly - 2 sets - 10 reps - Seated Long Arc Quad  - 1 x daily - 7 x weekly - 2 sets - 10 reps - Seated single leg step to side   - 1 x daily - 7 x weekly - 2 sets - 10 reps - Sit to Stand with Counter Support  - 1 x daily - 7 x weekly - 1 sets - 5 reps  GOALS Goals reviewed with patient? Yes  SHORT TERM GOALS: Target date:  06/02/2022   Patient will be independent in home exercise program to improve strength/mobility for better functional independence with ADLs. Baseline: No HEP currently 9/13: Pt not compliant with HEP nut doing some of the exercises intermittently  Goal status: NOT MET  LONG TERM GOALS: Target date: 06/14/2022 1.  Patient (> 96 years old) will complete five times sit to stand test in < 15 seconds without assistance from walker for standing balance indicating an increased LE strength and improved balance. Baseline: 18.77 with significant UE use and with walker for balance 16.96 sec with UE assist on walker Goal status: IN PROGRESS  2.  Patient will increase FOTO score to equal to or greater  than  57   to demonstrate statistically significant improvement in mobility and quality of life.  Baseline: 47 9/13: 67 Goal status: MET    3.  Patient will reduce timed up and go to <11 seconds to reduce fall risk and demonstrate improved transfer/gait ability. Baseline: 76.68 9/13:51.07 sec Goal status: IN PROGRESS  4.  Patient will increase 10 meter walk test to >.12ms as to improve gait speed for better community ambulation and to reduce fall risk. Baseline: 8/4: 21.36 sec (.481m) 9/13:22.93 Goal status: IN PROGRESS  5.  Pt will stand independently without UE assist and without LOB for 3 min in order to indicate improved standing endurance and LE strength for cooking and meal prep activities .  Baseline: 50 seconds 9/13: 55 seconds no UE, 1:30 with UE assist  Goal status: IN PROGRESS       ASSESSMENT:  CLINICAL IMPRESSION: Pt responded well to exercises today, however noted fatigue with most exercises.  Session was limited by time due to pt arriving late to the clinic.  Pt was able to ambulate further distance and participate in exercises with greater muscular contraction necessary for improvement.  Pt encouraged and advised that in order to get better, he needs to be compliant with HEP and  at minimum getting up and ambulating around the house.  Pt verbalizes understanding, and states he would attempt to perform more often.   Pt will continue to benefit from skilled therapy to address remaining deficits in order to improve overall QoL and return to PLOF.      OBJECTIVE IMPAIRMENTS Abnormal gait, decreased activity tolerance, decreased balance, decreased mobility, difficulty walking, decreased strength, impaired perceived functional ability, and impaired flexibility.   ACTIVITY LIMITATIONS bending, standing, squatting, stairs, transfers, and locomotion level  PARTICIPATION LIMITATIONS: meal prep, cleaning, laundry, medication management, driving, and community activity  PERSONAL FACTORS Age and 3+ comorbidities: CHF, COPD, HLD, HTN  are also affecting patient's functional outcome.   REHAB POTENTIAL: Fair very deconditioned and has had significant therapy to this point. Also his motivation for HEPcompletion is low.   CLINICAL DECISION MAKING: Evolving/moderate complexity  EVALUATION COMPLEXITY: Moderate  PLAN: PT FREQUENCY: 2x/week  PT DURATION: 12 weeks  PLANNED INTERVENTIONS: Therapeutic exercises, Therapeutic activity, Neuromuscular re-education, Balance training, Gait training, Patient/Family education, Self Care, Joint mobilization, Dry Needling, and Manual therapy  PLAN FOR NEXT SESSION: Continue with standing and walking tasks due to LE weakness and fatigue, nustep, hip ABD interventions, UB conditioning, address LUE neural tension, continue plan   JoGwenlyn SaranPT, DPT Physical Therapist- [facility]10/19/23, 1:30 PM

## 2022-06-15 ENCOUNTER — Ambulatory Visit: Payer: Medicare Other

## 2022-06-17 ENCOUNTER — Ambulatory Visit: Payer: Medicare Other

## 2022-06-17 DIAGNOSIS — R2689 Other abnormalities of gait and mobility: Secondary | ICD-10-CM

## 2022-06-17 DIAGNOSIS — R262 Difficulty in walking, not elsewhere classified: Secondary | ICD-10-CM

## 2022-06-17 DIAGNOSIS — R2681 Unsteadiness on feet: Secondary | ICD-10-CM

## 2022-06-17 DIAGNOSIS — M6281 Muscle weakness (generalized): Secondary | ICD-10-CM

## 2022-06-17 NOTE — Therapy (Signed)
OUTPATIENT PHYSICAL THERAPY NEURO TREATMENT/RECERT   Patient Name: Christopher [name] MRN: [id number] DOB:August 25, 1950, 71 y.o., male Today's Date: 06/17/2022   PCP: Rusty Aus, MD REFERRING PROVIDER: Rusty Aus, MD   PT End of Session - 06/17/22 1452     Visit Number 18    Number of Visits 42    Date for PT Re-Evaluation 09/09/22    Authorization Type [employer] Medicare    Authorization Time Period 03/22/22-06/14/22    Progress Note Due on Visit 20    PT Start Time 1436    PT Stop Time 1514    PT Time Calculation (min) 38 min    Equipment Utilized During Treatment Gait belt    Activity Tolerance Patient tolerated treatment well;No increased pain    Behavior During Therapy WFL for tasks assessed/performed               Past Medical History:  Diagnosis Date   Anxiety    CHF (congestive heart failure) (HCC)    COPD (chronic obstructive pulmonary disease) ([place])    Depression    Dysrhythmia    Hyperlipidemia    Hypertension    Hypothyroidism    Sleep apnea    Past Surgical History:  Procedure Laterality Date   FRACTURE SURGERY     ORIF ANKLE FRACTURE Left 12/17/2020   Procedure: OPEN REDUCTION INTERNAL FIXATION (ORIF) ANKLE FRACTURE;  Surgeon: Samara Deist, DPM;  Location: [facility] ORS;  Service: Podiatry;  Laterality: Left;   Patient Active Problem List   Diagnosis Date Noted   Generalized weakness 10/16/2021   Chronic respiratory failure with hypoxia (HCC) 10/16/2021   Hypotension    SIRS (systemic inflammatory response syndrome) (HCC)    AF (paroxysmal atrial fibrillation) ([place])    Syncope    Severe sepsis ([place]) 05/20/2021   Pressure injury of skin 04/25/2021   Acute respiratory failure ([place]) 04/21/2021   Acute kidney injury superimposed on CKD ([place]) 04/21/2021   Stage 3b chronic kidney disease (CKD) ([place]) 03/22/2021   Leukocytosis 03/22/2021   Osteomyelitis of ankle or foot, acute, left ([place]) 03/17/2021   Acute on chronic respiratory failure ([place])  03/17/2021   Osteomyelitis ([place]) 12/16/2020   Depression 12/16/2020   Medicare annual wellness visit, initial 11/14/2019   B12 deficiency 11/14/2019   Hyperlipidemia 10/31/2019   Primary osteoarthritis of right knee 03/16/2019   Sleep apnea 10/31/2018   Traumatic incomplete tear of left rotator cuff 10/02/2018   Morbid obesity with BMI of 45.0-49.9, adult ([place]) 09/20/2018   Hypothyroidism 09/20/2018   Chronic diastolic CHF (congestive heart failure) ([place]) 09/20/2018   Benign essential hypertension 09/20/2018    ONSET DATE: 10/16/20  REFERRING DIAG: R27.0 (ICD-10-CM) - Ataxia, unspecified  THERAPY DIAG:  Difficulty in walking, not elsewhere classified  Muscle weakness (generalized)  Other abnormalities of gait and mobility  Unsteadiness on feet  Rationale for Evaluation and Treatment Rehabilitation  SUBJECTIVE:  SUBJECTIVE STATEMENT:  [place] Monday slipping getting out of shower. Hit his head. Had to call EMS to get him up from the floor.   Pt accompanied by: family member DTR [name])   PERTINENT HISTORY: Pt is s/p ankle fracture February 2022. Patient reports independence with most activities prior to initial injury of his ankle.  [name] to STR at [facility], then OPPT at [facility]. Pt has struggled to return to premorbid status. Patient recently started medication for new depression which began after wife passing away this year.  Depression has been a barrier to compliance with HEP in home. Pt reports progressive deconditioning 2/2 falls anxiety.  PAIN:  Are you having pain? No  PRECAUTIONS: Fall  WEIGHT BEARING RESTRICTIONS No  LIVING ENVIRONMENT: Lives with: lives with their family Lives in: House/apartment Stairs: Yes: External: 1 steps; none 4 steps in  garage with bilateral rails that are difficulty to grip.  Has following equipment at home: Gilford Rile - 2 wheeled, Environmental consultant - 4 wheeled, and shower chair  PLOF: Independent with household mobility with device, Independent with homemaking with ambulation, Independent with gait, and Leisure: making rings out of coins   PATIENT GOALS Improve ability to cook, be able to access farmer's market independently. Be able to go into restaurants.     TODAY'S TREATMENT: 23/55/73 RECERT:  Goals: See below for details  Standing next to support bar: -marches 10x each LE; second set with alternating LE's 10x each LE -abduction 5x each LE;  -sort color of letters with SUE support ; one seated rest break x 6 minutes  Seated: Adduction ball squeeze 10x 3 second holds 2.5 ankle weights: -LAQ 8x each LE -soccer ball kicks for coordination, reaction timing,  and sequencing. X3 minutes  -seated march with arms crossed 10x each LE   PATIENT EDUCATION: Education details: Pt educated throughout session about proper posture and technique with exercises. Improved exercise technique, movement at target joints, use of target muscles after min to mod verbal, visual, tactile cues.  Person educated: Patient, DTR Education method: Explanation, Demonstration, and Verbal cues Education comprehension: verbalized understanding, returned demonstration, verbal cues required, and needs further education   HOME EXERCISE PROGRAM: No updates today, pt to continue HEP as previously given. Educated to set timer every 1-2hrs and complete walking/HEP for improved endurance Access Code: [email or web address] URL: [email or web address] Date: 03/26/2022 Prepared by: Rivka Barbara  Exercises - Seated March  - 1 x daily - 7 x weekly - 2 sets - 10 reps - Seated Long Arc Quad  - 1 x daily - 7 x weekly - 2 sets - 10 reps - Seated single leg step to side   - 1 x daily - 7 x weekly - 2 sets - 10 reps - Sit to Stand with Counter  Support  - 1 x daily - 7 x weekly - 1 sets - 5 reps  GOALS Goals reviewed with patient? Yes  SHORT TERM GOALS: Target date: 07/29/2022      Patient will be independent in home exercise program to improve strength/mobility for better functional independence with ADLs. Baseline: No HEP currently 9/13: Pt not compliant with HEP nut doing some of the exercises intermittently  10/26: education on compliance  Goal status: NOT MET  LONG TERM GOALS: Target date: 09/09/2022   1.  Patient (> 85 years old) will complete five times sit to stand test in < 15 seconds without assistance from walker for standing balance indicating an increased LE strength and  improved balance. Baseline: 18.77 with significant UE use and with walker for balance 16.96 sec with UE assist on walker 10/26: 20.87 seconds Goal status: IN PROGRESS  2.  Patient will increase FOTO score to equal to or greater than  57   to demonstrate statistically significant improvement in mobility and quality of life.  Baseline: 47 9/13: 67 Goal status: MET    3.  Patient will reduce timed up and go to <11 seconds to reduce fall risk and demonstrate improved transfer/gait ability. Baseline: 76.68 9/13:51.07 sec 10/26: 29.94 seconds with RW  Goal status: IN PROGRESS  4.  Patient will increase 10 meter walk test to >.16ms as to improve gait speed for better community ambulation and to reduce fall risk. Baseline: 8/4: 21.36 sec (.425m) 9/13:22.93 10/26: 18.16 seconds =0.55 m/s  Goal status: IN PROGRESS  5.  Pt will stand independently without UE assist and without LOB for 3 min in order to indicate improved standing endurance and LE strength for cooking and meal prep activities .  Baseline: 50 seconds 9/13: 55 seconds no UE, 1:30 with UE assist 10/26: 58 seconds no UE support Goal status: IN PROGRESS       ASSESSMENT:  CLINICAL IMPRESSION: Patient's gait speed has increased as can be seen in TUG and 10 MWT goal improvements. His  sit to stand slightly decreased; in part due to confusion. Patient educated on need for compliance with HEP. He will benefit from continuation of therapy to continue strengthening and stabilizing patient as he did have recent fall slipping on wet floor out of shower. Pt will continue to benefit from skilled therapy to address remaining deficits in order to improve overall QoL and return to PLOF.      OBJECTIVE IMPAIRMENTS Abnormal gait, decreased activity tolerance, decreased balance, decreased mobility, difficulty walking, decreased strength, impaired perceived functional ability, and impaired flexibility.   ACTIVITY LIMITATIONS bending, standing, squatting, stairs, transfers, and locomotion level  PARTICIPATION LIMITATIONS: meal prep, cleaning, laundry, medication management, driving, and community activity  PERSONAL FACTORS Age and 3+ comorbidities: CHF, COPD, HLD, HTN  are also affecting patient's functional outcome.   REHAB POTENTIAL: Fair very deconditioned and has had significant therapy to this point. Also his motivation for HEPcompletion is low.   CLINICAL DECISION MAKING: Evolving/moderate complexity  EVALUATION COMPLEXITY: Moderate  PLAN: PT FREQUENCY: 2x/week  PT DURATION: 12 weeks  PLANNED INTERVENTIONS: Therapeutic exercises, Therapeutic activity, Neuromuscular re-education, Balance training, Gait training, Patient/Family education, Self Care, Joint mobilization, Dry Needling, and Manual therapy  PLAN FOR NEXT SESSION: Continue with standing and walking tasks due to LE weakness and fatigue, nustep, hip ABD interventions, UB conditioning, address LUE neural tension, continue plan   [facility]10/26/23, 3:14 PM

## 2022-06-21 ENCOUNTER — Encounter (INDEPENDENT_AMBULATORY_CARE_PROVIDER_SITE_OTHER): Payer: Self-pay

## 2022-06-22 ENCOUNTER — Ambulatory Visit: Payer: Medicare Other

## 2022-06-22 DIAGNOSIS — R2681 Unsteadiness on feet: Secondary | ICD-10-CM

## 2022-06-22 DIAGNOSIS — R262 Difficulty in walking, not elsewhere classified: Secondary | ICD-10-CM

## 2022-06-22 DIAGNOSIS — R2689 Other abnormalities of gait and mobility: Secondary | ICD-10-CM

## 2022-06-22 DIAGNOSIS — M6281 Muscle weakness (generalized): Secondary | ICD-10-CM | POA: Diagnosis not present

## 2022-06-22 DIAGNOSIS — R269 Unspecified abnormalities of gait and mobility: Secondary | ICD-10-CM

## 2022-06-22 NOTE — Therapy (Signed)
OUTPATIENT PHYSICAL THERAPY NEURO TREATMENT/RECERT   Patient Name: Christopher [name] MRN: [id number] DOB:1951-08-01, 71 y.o., male Today's Date: 06/22/2022   PCP: Rusty Aus, MD REFERRING PROVIDER: Rusty Aus, MD   PT End of Session - 06/22/22 [phone]     Visit Number 19    Number of Visits 42    Date for PT Re-Evaluation 09/09/22    Authorization Type UHC Medicare    Authorization Time Period 03/22/22-06/14/22    Progress Note Due on Visit 20    PT Start Time 0938    PT Stop Time 1015    PT Time Calculation (min) 37 min    Equipment Utilized During Treatment Gait belt    Activity Tolerance Patient tolerated treatment well;No increased pain    Behavior During Therapy WFL for tasks assessed/performed                Past Medical History:  Diagnosis Date   Anxiety    CHF (congestive heart failure) (HCC)    COPD (chronic obstructive pulmonary disease) ([place])    Depression    Dysrhythmia    Hyperlipidemia    Hypertension    Hypothyroidism    Sleep apnea    Past Surgical History:  Procedure Laterality Date   FRACTURE SURGERY     ORIF ANKLE FRACTURE Left 12/17/2020   Procedure: OPEN REDUCTION INTERNAL FIXATION (ORIF) ANKLE FRACTURE;  Surgeon: Samara Deist, DPM;  Location: [facility] ORS;  Service: Podiatry;  Laterality: Left;   Patient Active Problem List   Diagnosis Date Noted   Generalized weakness 10/16/2021   Chronic respiratory failure with hypoxia (HCC) 10/16/2021   Hypotension    SIRS (systemic inflammatory response syndrome) (HCC)    AF (paroxysmal atrial fibrillation) ([place])    Syncope    Severe sepsis ([place]) 05/20/2021   Pressure injury of skin 04/25/2021   Acute respiratory failure ([place]) 04/21/2021   Acute kidney injury superimposed on CKD ([place]) 04/21/2021   Stage 3b chronic kidney disease (CKD) ([place]) 03/22/2021   Leukocytosis 03/22/2021   Osteomyelitis of ankle or foot, acute, left ([place]) 03/17/2021   Acute on chronic respiratory failure ([place])  03/17/2021   Osteomyelitis ([place]) 12/16/2020   Depression 12/16/2020   Medicare annual wellness visit, initial 11/14/2019   B12 deficiency 11/14/2019   Hyperlipidemia 10/31/2019   Primary osteoarthritis of right knee 03/16/2019   Sleep apnea 10/31/2018   Traumatic incomplete tear of left rotator cuff 10/02/2018   Morbid obesity with BMI of 45.0-49.9, adult ([place]) 09/20/2018   Hypothyroidism 09/20/2018   Chronic diastolic CHF (congestive heart failure) ([place]) 09/20/2018   Benign essential hypertension 09/20/2018    ONSET DATE: 10/16/20  REFERRING DIAG: R27.0 (ICD-10-CM) - Ataxia, unspecified  THERAPY DIAG:  Difficulty in walking, not elsewhere classified  Muscle weakness (generalized)  Other abnormalities of gait and mobility  Unsteadiness on feet  Abnormality of gait and mobility  Rationale for Evaluation and Treatment Rehabilitation  SUBJECTIVE:  SUBJECTIVE STATEMENT:  Pt denies any falls since the one where he slipped on the water in the bathroom.  Pt has not been doing his exercises at home, but they report he had to get up /down a lot yesterday.    Pt accompanied by: family member DTR [name])   PERTINENT HISTORY: Pt is s/p ankle fracture February 2022. Patient reports independence with most activities prior to initial injury of his ankle.  [name] to STR at [facility], then OPPT at [facility]. Pt has struggled to return to premorbid status. Patient recently started medication for new depression which began after wife passing away this year.  Depression has been a barrier to compliance with HEP in home. Pt reports progressive deconditioning 2/2 falls anxiety.  PAIN:  Are you having pain? No  PRECAUTIONS: Fall  WEIGHT BEARING RESTRICTIONS No  LIVING  ENVIRONMENT: Lives with: lives with their family Lives in: House/apartment Stairs: Yes: External: 1 steps; none 4 steps in garage with bilateral rails that are difficulty to grip.  Has following equipment at home: Gilford Rile - 2 wheeled, Environmental consultant - 4 wheeled, and shower chair  PLOF: Independent with household mobility with device, Independent with homemaking with ambulation, Independent with gait, and Leisure: making rings out of coins   PATIENT GOALS Improve ability to cook, be able to access farmer's market independently. Be able to go into restaurants.     TODAY'S TREATMENT: 06/22/22  Standing in // bars with 3# AW donned throughout: Marches 2x10 each LE; second set with alternating LE's 10x each LE Hip abduction, 2x10 each LE Hip extension, 2x10 each LE Hamstring curl, 2x10 each LE Ambulation 8 feet laps x4 increments  Pt with significant fatigue throughout and requires rest breaks.   Seated: Adduction ball squeeze 2x10, 3 second holds 3# ankle weights: Seated LAQ 8x each LE Seated march with arms crossed 10x each LE    PATIENT EDUCATION: Education details: Pt educated throughout session about proper posture and technique with exercises. Improved exercise technique, movement at target joints, use of target muscles after min to mod verbal, visual, tactile cues.  Person educated: Patient, DTR Education method: Explanation, Demonstration, and Verbal cues Education comprehension: verbalized understanding, returned demonstration, verbal cues required, and needs further education   HOME EXERCISE PROGRAM: No updates today, pt to continue HEP as previously given. Educated to set timer every 1-2hrs and complete walking/HEP for improved endurance Access Code: [email or web address] URL: [email or web address] Date: 03/26/2022 Prepared by: Rivka Barbara  Exercises - Seated March  - 1 x daily - 7 x weekly - 2 sets - 10 reps - Seated Long Arc Quad  - 1 x daily - 7 x weekly - 2  sets - 10 reps - Seated single leg step to side   - 1 x daily - 7 x weekly - 2 sets - 10 reps - Sit to Stand with Counter Support  - 1 x daily - 7 x weekly - 1 sets - 5 reps  GOALS Goals reviewed with patient? Yes  SHORT TERM GOALS: Target date: 07/29/2022   Patient will be independent in home exercise program to improve strength/mobility for better functional independence with ADLs. Baseline: No HEP currently 9/13: Pt not compliant with HEP nut doing some of the exercises intermittently  10/26: education on compliance  Goal status: NOT MET  LONG TERM GOALS: Target date: 09/09/2022   1.  Patient (> 63 years old) will complete five times sit to stand test in < 15 seconds  without assistance from walker for standing balance indicating an increased LE strength and improved balance. Baseline: 18.77 with significant UE use and with walker for balance 16.96 sec with UE assist on walker 10/26: 20.87 seconds Goal status: IN PROGRESS  2.  Patient will increase FOTO score to equal to or greater than  57   to demonstrate statistically significant improvement in mobility and quality of life.  Baseline: 47 9/13: 67 Goal status: MET    3.  Patient will reduce timed up and go to <11 seconds to reduce fall risk and demonstrate improved transfer/gait ability. Baseline: 76.68 9/13:51.07 sec 10/26: 29.94 seconds with RW  Goal status: IN PROGRESS  4.  Patient will increase 10 meter walk test to >.57ms as to improve gait speed for better community ambulation and to reduce fall risk. Baseline: 8/4: 21.36 sec (.481m) 9/13:22.93 10/26: 18.16 seconds =0.55 m/s  Goal status: IN PROGRESS  5.  Pt will stand independently without UE assist and without LOB for 3 min in order to indicate improved standing endurance and LE strength for cooking and meal prep activities .  Baseline: 50 seconds 9/13: 55 seconds no UE, 1:30 with UE assist 10/26: 58 seconds no UE support Goal status: IN  PROGRESS       ASSESSMENT:  CLINICAL IMPRESSION:  Pt performed well with all exercises with noticeable fatigue throughout the treatment session due to deconditioning.  Pt session limited due to pt arriving late to the clinic.  Pt continues to need improved endurance training for increased stability when in upright positions.  Pt will continue to benefit from skilled therapy to address remaining deficits in order to improve overall QoL and return to PLOF.       OBJECTIVE IMPAIRMENTS Abnormal gait, decreased activity tolerance, decreased balance, decreased mobility, difficulty walking, decreased strength, impaired perceived functional ability, and impaired flexibility.   ACTIVITY LIMITATIONS bending, standing, squatting, stairs, transfers, and locomotion level  PARTICIPATION LIMITATIONS: meal prep, cleaning, laundry, medication management, driving, and community activity  PERSONAL FACTORS Age and 3+ comorbidities: CHF, COPD, HLD, HTN  are also affecting patient's functional outcome.   REHAB POTENTIAL: Fair very deconditioned and has had significant therapy to this point. Also his motivation for HEPcompletion is low.   CLINICAL DECISION MAKING: Evolving/moderate complexity  EVALUATION COMPLEXITY: Moderate  PLAN: PT FREQUENCY: 2x/week  PT DURATION: 12 weeks  PLANNED INTERVENTIONS: Therapeutic exercises, Therapeutic activity, Neuromuscular re-education, Balance training, Gait training, Patient/Family education, Self Care, Joint mobilization, Dry Needling, and Manual therapy  PLAN FOR NEXT SESSION:   Continue with standing and walking tasks due to LE weakness and fatigue, nustep, hip ABD interventions, UB conditioning, address LUE neural tension, continue plan   JoGwenlyn SaranPT, DPT Physical Therapist- [facility]10/31/23, 3:46 PM

## 2022-06-24 ENCOUNTER — Ambulatory Visit: Payer: Medicare Other | Attending: Internal Medicine

## 2022-06-24 DIAGNOSIS — R2681 Unsteadiness on feet: Secondary | ICD-10-CM | POA: Diagnosis present

## 2022-06-24 DIAGNOSIS — R262 Difficulty in walking, not elsewhere classified: Secondary | ICD-10-CM | POA: Diagnosis not present

## 2022-06-24 DIAGNOSIS — R269 Unspecified abnormalities of gait and mobility: Secondary | ICD-10-CM | POA: Insufficient documentation

## 2022-06-24 DIAGNOSIS — M6281 Muscle weakness (generalized): Secondary | ICD-10-CM | POA: Insufficient documentation

## 2022-06-24 DIAGNOSIS — R2689 Other abnormalities of gait and mobility: Secondary | ICD-10-CM | POA: Diagnosis present

## 2022-06-24 NOTE — Therapy (Signed)
OUTPATIENT PHYSICAL THERAPY NEURO TREATMENT   Physical Therapy Progress Note  Dates of reporting period  05/05/22   to   06/24/2022    Patient Name: Christopher [name] MRN: [id number] DOB:05/18/1951, 71 y.o., male Today's Date: 06/24/2022   PCP: Rusty Aus, MD REFERRING PROVIDER: Rusty Aus, MD   PT End of Session - 06/24/22 1436     Visit Number 20    Number of Visits 42    Date for PT Re-Evaluation 09/09/22    Authorization Type [employer] Medicare    Authorization Time Period 03/22/22-06/14/22    Progress Note Due on Visit 30    PT Start Time 1435    PT Stop Time 1515    PT Time Calculation (min) 40 min    Equipment Utilized During Treatment Gait belt    Activity Tolerance Patient tolerated treatment well;No increased pain    Behavior During Therapy WFL for tasks assessed/performed                Past Medical History:  Diagnosis Date   Anxiety    CHF (congestive heart failure) (HCC)    COPD (chronic obstructive pulmonary disease) ([place])    Depression    Dysrhythmia    Hyperlipidemia    Hypertension    Hypothyroidism    Sleep apnea    Past Surgical History:  Procedure Laterality Date   FRACTURE SURGERY     ORIF ANKLE FRACTURE Left 12/17/2020   Procedure: OPEN REDUCTION INTERNAL FIXATION (ORIF) ANKLE FRACTURE;  Surgeon: Samara Deist, DPM;  Location: [facility] ORS;  Service: Podiatry;  Laterality: Left;   Patient Active Problem List   Diagnosis Date Noted   Generalized weakness 10/16/2021   Chronic respiratory failure with hypoxia (HCC) 10/16/2021   Hypotension    SIRS (systemic inflammatory response syndrome) (HCC)    AF (paroxysmal atrial fibrillation) ([place])    Syncope    Severe sepsis ([place]) 05/20/2021   Pressure injury of skin 04/25/2021   Acute respiratory failure ([place]) 04/21/2021   Acute kidney injury superimposed on CKD ([place]) 04/21/2021   Stage 3b chronic kidney disease (CKD) ([place]) 03/22/2021   Leukocytosis 03/22/2021   Osteomyelitis of ankle or  foot, acute, left ([place]) 03/17/2021   Acute on chronic respiratory failure ([place]) 03/17/2021   Osteomyelitis ([place]) 12/16/2020   Depression 12/16/2020   Medicare annual wellness visit, initial 11/14/2019   B12 deficiency 11/14/2019   Hyperlipidemia 10/31/2019   Primary osteoarthritis of right knee 03/16/2019   Sleep apnea 10/31/2018   Traumatic incomplete tear of left rotator cuff 10/02/2018   Morbid obesity with BMI of 45.0-49.9, adult ([place]) 09/20/2018   Hypothyroidism 09/20/2018   Chronic diastolic CHF (congestive heart failure) ([place]) 09/20/2018   Benign essential hypertension 09/20/2018    ONSET DATE: 10/16/20  REFERRING DIAG: R27.0 (ICD-10-CM) - Ataxia, unspecified  THERAPY DIAG:  Difficulty in walking, not elsewhere classified  Muscle weakness (generalized)  Unsteadiness on feet  Abnormality of gait and mobility  Rationale for Evaluation and Treatment Rehabilitation  SUBJECTIVE:  SUBJECTIVE STATEMENT: Pt. Reports they have been doing some of their seated leg strengthening exercises at home and have been doing a lot of get up / down over the past week.   Pt accompanied by: family member DTR [name])   PERTINENT HISTORY: Pt is s/p ankle fracture February 2022. Patient reports independence with most activities prior to initial injury of his ankle.  [name] to STR at [facility], then OPP[facility] at [facility]. Pt has struggled to return to premorbid status. Patient recently started medication for new depression which began after wife passing away this year.  Depression has been a barrier to compliance with HEP in home. Pt reports progressive deconditioning 2/2 falls anxiety.  PAIN:  Are you having pain? No  PRECAUTIONS: Fall  WEIGHT BEARING RESTRICTIONS No  LIVING  ENVIRONMENT: Lives with: lives with their family Lives in: House/apartment Stairs: Yes: External: 1 steps; none 4 steps in garage with bilateral rails that are difficulty to grip.  Has following equipment at home: Gilford Rile - 2 wheeled, Environmental consultant - 4 wheeled, and shower chair  PLOF: Independent with household mobility with device, Independent with homemaking with ambulation, Independent with gait, and Leisure: making rings out of coins   PATIENT GOALS Improve ability to cook, be able to access farmer's market independently. Be able to go into restaurants.    TODAY'S TREATMENT: 06/24/22  In // bars:  - walking forward 2x  - lateral side stepping 4x  - alternating marches into GTB 10x ea - Step ups on 2 inch step (R) leg first 5x - Step ups on 2 inch step (L) leg first 5x  - Alternating foot taps on 2 inch step 10xea - Hedgehog lateral steps 5x ea  Saebo ball standing tolerance  - 2 rounds bringing balls up and back down   Vitals: 98 O2 sat, 105 HR during standing exercises  RPE: 5-6/10   Seated:  - Ball squeezes 15x  - Yellow weighted ball seated marches with back off backrest 10x  - Yellow weighted ball arcs with back off backrest for core stability 10x  - Seated hip flexor straight leg raises side to side 10x ea leg   Pt experiences some fatigue throughout and requires rest breaks.  PATIENT EDUCATION: Education details: Pt educated throughout session about proper posture and technique with exercises. Improved exercise technique, movement at target joints, use of target muscles after min to mod verbal, visual, tactile cues.  Person educated: Patient, DTR Education method: Explanation, Demonstration, and Verbal cues Education comprehension: verbalized understanding, returned demonstration, verbal cues required, and needs further education   HOME EXERCISE PROGRAM: No updates today, pt to continue HEP as previously given. Educated to set timer every 1-2hrs and complete walking/HEP  for improved endurance Access Code: [email or web address] URL: [email or web address] Date: 03/26/2022 Prepared by: Rivka Barbara  Exercises - Seated March  - 1 x daily - 7 x weekly - 2 sets - 10 reps - Seated Long Arc Quad  - 1 x daily - 7 x weekly - 2 sets - 10 reps - Seated single leg step to side   - 1 x daily - 7 x weekly - 2 sets - 10 reps - Sit to Stand with Counter Support  - 1 x daily - 7 x weekly - 1 sets - 5 reps  GOALS Goals reviewed with patient? Yes  SHORT TERM GOALS: Target date: 07/29/2022   Patient will be independent in home exercise program to improve strength/mobility for better functional independence  with ADLs. Baseline: No HEP currently 9/13: Pt not compliant with HEP nut doing some of the exercises intermittently  10/26: education on compliance  Goal status: NOT MET  LONG TERM GOALS: Target date: 09/09/2022   1.  Patient (> 85 years old) will complete five times sit to stand test in < 15 seconds without assistance from walker for standing balance indicating an increased LE strength and improved balance. Baseline: 18.77 with significant UE use and with walker for balance 16.96 sec with UE assist on walker 10/26: 20.87 seconds Goal status: IN PROGRESS  2.  Patient will increase FOTO score to equal to or greater than  57   to demonstrate statistically significant improvement in mobility and quality of life.  Baseline: 47 9/13: 67 Goal status: MET    3.  Patient will reduce timed up and go to <11 seconds to reduce fall risk and demonstrate improved transfer/gait ability. Baseline: 76.68 9/13:51.07 sec 10/26: 29.94 seconds with RW  Goal status: IN PROGRESS  4.  Patient will increase 10 meter walk test to >.30ms as to improve gait speed for better community ambulation and to reduce fall risk. Baseline: 8/4: 21.36 sec (.417m) 9/13:22.93 10/26: 18.16 seconds =0.55 m/s  Goal status: IN PROGRESS  5.  Pt will stand independently without UE assist and without  LOB for 3 min in order to indicate improved standing endurance and LE strength for cooking and meal prep activities .  Baseline: 50 seconds 9/13: 55 seconds no UE, 1:30 with UE assist 10/26: 58 seconds no UE support Goal status: IN PROGRESS   ASSESSMENT:  CLINICAL IMPRESSION: The patient's goals were not assessed today as they were just evaluated last week. Please refer to recert note on 1088/32/54or goals. Patient demonstrated good improvement with standing tolerance and overall balance during functional tasks during today session. The patient continues to demonstrate fatigue throughout treatment sessions due to deconditioning but is able to gradually progress exercises intensity every week. Patient's condition has the potential to improve in response to therapy. Maximum improvement is yet to be obtained. The anticipated improvement is attainable and reasonable in a generally predictable time. The patient will benefit from continued skilled therapy to increase cardiovascular fitness and functional ability in ADLs.   OBJECTIVE IMPAIRMENTS Abnormal gait, decreased activity tolerance, decreased balance, decreased mobility, difficulty walking, decreased strength, impaired perceived functional ability, and impaired flexibility.   ACTIVITY LIMITATIONS bending, standing, squatting, stairs, transfers, and locomotion level  PARTICIPATION LIMITATIONS: meal prep, cleaning, laundry, medication management, driving, and community activity  PERSONAL FACTORS Age and 3+ comorbidities: CHF, COPD, HLD, HTN  are also affecting patient's functional outcome.   REHAB POTENTIAL: Fair very deconditioned and has had significant therapy to this point. Also his motivation for HEPcompletion is low.   CLINICAL DECISION MAKING: Evolving/moderate complexity  EVALUATION COMPLEXITY: Moderate  PLAN: PT FREQUENCY: 2x/week  PT DURATION: 12 weeks  PLANNED INTERVENTIONS: Therapeutic exercises, Therapeutic activity,  Neuromuscular re-education, Balance training, Gait training, Patient/Family education, Self Care, Joint mobilization, Dry Needling, and Manual therapy  PLAN FOR NEXT SESSION:  Continue to progress standing and walking exercises to address both deconditioning and LE weakness.   PeSudie BaileyPT   This entire session was performed under direct supervision and direction of a licensed therapist/therapist assistant . I have personally read, edited and approve of the note as written.  MaJanna ArchPT, DPT  06/24/22, 3:09 PM

## 2022-06-28 NOTE — Therapy (Incomplete)
OUTPATIENT PHYSICAL THERAPY NEURO TREATMENT     Patient Name: Christopher [name] MRN: [id number] DOB:[birth year], 71 y.o., male Today's Date: 06/28/2022   PCP: Rusty Aus, MD REFERRING PROVIDER: Rusty Aus, MD        Past Medical History:  Diagnosis Date   Anxiety    CHF (congestive heart failure) ([place])    COPD (chronic obstructive pulmonary disease) ([place])    Depression    Dysrhythmia    Hyperlipidemia    Hypertension    Hypothyroidism    Sleep apnea    Past Surgical History:  Procedure Laterality Date   FRACTURE SURGERY     ORIF ANKLE FRACTURE Left 12/17/2020   Procedure: OPEN REDUCTION INTERNAL FIXATION (ORIF) ANKLE FRACTURE;  Surgeon: Samara Deist, DPM;  Location: [facility] ORS;  Service: Podiatry;  Laterality: Left;   Patient Active Problem List   Diagnosis Date Noted   Generalized weakness 10/16/2021   Chronic respiratory failure with hypoxia (HCC) 10/16/2021   Hypotension    SIRS (systemic inflammatory response syndrome) (HCC)    AF (paroxysmal atrial fibrillation) ([place])    Syncope    Severe sepsis ([place]) 05/20/2021   Pressure injury of skin 04/25/2021   Acute respiratory failure ([place]) 04/21/2021   Acute kidney injury superimposed on CKD ([place]) 04/21/2021   Stage 3b chronic kidney disease (CKD) ([place]) 03/22/2021   Leukocytosis 03/22/2021   Osteomyelitis of ankle or foot, acute, left ([place]) 03/17/2021   Acute on chronic respiratory failure ([place]) 03/17/2021   Osteomyelitis ([place]) 12/16/2020   Depression 12/16/2020   Medicare annual wellness visit, initial 11/14/2019   B12 deficiency 11/14/2019   Hyperlipidemia 10/31/2019   Primary osteoarthritis of right knee 03/16/2019   Sleep apnea 10/31/2018   Traumatic incomplete tear of left rotator cuff 10/02/2018   Morbid obesity with BMI of 45.0-49.9, adult ([place]) 09/20/2018   Hypothyroidism 09/20/2018   Chronic diastolic CHF (congestive heart failure) ([place]) 09/20/2018   Benign essential hypertension 09/20/2018     ONSET DATE: 10/16/20  REFERRING DIAG: R27.0 (ICD-10-CM) - Ataxia, unspecified  THERAPY DIAG:  No diagnosis found.  Rationale for Evaluation and Treatment Rehabilitation  SUBJECTIVE:                                                                                                                                                                                             SUBJECTIVE STATEMENT:  ***  Pt. Reports they have been doing some of their seated leg strengthening exercises at home and have been doing a lot of get up / down over the past week.   Pt accompanied by: family member DTR [name])  PERTINENT HISTORY: Pt is s/p ankle fracture February 2022. Patient reports independence with most activities prior to initial injury of his ankle.  [name] to STR at [facility], then OPPT at [facility]. Pt has struggled to return to premorbid status. Patient recently started medication for new depression which began after wife passing away this year.  Depression has been a barrier to compliance with HEP in home. Pt reports progressive deconditioning 2/2 falls anxiety.  PAIN:  Are you having pain? No  PRECAUTIONS: Fall  WEIGHT BEARING RESTRICTIONS No  LIVING ENVIRONMENT: Lives with: lives with their family Lives in: House/apartment Stairs: Yes: External: 1 steps; none 4 steps in garage with bilateral rails that are difficulty to grip.  Has following equipment at home: Gilford Rile - 2 wheeled, Environmental consultant - 4 wheeled, and shower chair  PLOF: Independent with household mobility with device, Independent with homemaking with ambulation, Independent with gait, and Leisure: making rings out of coins   PATIENT GOALS Improve ability to cook, be able to access farmer's market independently. Be able to go into restaurants.    TODAY'S TREATMENT: 06/28/22  In // bars:  - walking forward 2x  - lateral side stepping 4x  - alternating marches into GTB 10x ea - Step ups on 2 inch step  (R) leg first 5x - Step ups on 2 inch step (L) leg first 5x  - Alternating foot taps on 2 inch step 10xea - Hedgehog lateral steps 5x ea  Saebo ball standing tolerance  - 2 rounds bringing balls up and back down   Vitals: 98 O2 sat, 105 HR during standing exercises  RPE: 5-6/10   Seated:  - Ball squeezes 15x  - Yellow weighted ball seated marches with back off backrest 10x  - Yellow weighted ball arcs with back off backrest for core stability 10x  - Seated hip flexor straight leg raises side to side 10x ea leg   Pt experiences some fatigue throughout and requires rest breaks.   PATIENT EDUCATION: Education details: Pt educated throughout session about proper posture and technique with exercises. Improved exercise technique, movement at target joints, use of target muscles after min to mod verbal, visual, tactile cues.  Person educated: Patient, DTR Education method: Explanation, Demonstration, and Verbal cues Education comprehension: verbalized understanding, returned demonstration, verbal cues required, and needs further education   HOME EXERCISE PROGRAM: No updates today, pt to continue HEP as previously given. Educated to set timer every 1-2hrs and complete walking/HEP for improved endurance Access Code: [email or web address] URL: [email or web address] Date: 03/26/2022 Prepared by: Rivka Barbara  Exercises - Seated March  - 1 x daily - 7 x weekly - 2 sets - 10 reps - Seated Long Arc Quad  - 1 x daily - 7 x weekly - 2 sets - 10 reps - Seated single leg step to side   - 1 x daily - 7 x weekly - 2 sets - 10 reps - Sit to Stand with Counter Support  - 1 x daily - 7 x weekly - 1 sets - 5 reps  GOALS Goals reviewed with patient? Yes  SHORT TERM GOALS: Target date: 07/29/2022   Patient will be independent in home exercise program to improve strength/mobility for better functional independence with ADLs. Baseline: No HEP currently 9/13: Pt not compliant with HEP nut doing  some of the exercises intermittently  10/26: education on compliance  Goal status: NOT MET  LONG TERM GOALS: Target date: 09/09/2022   1.  Patient (>  75 years old) will complete five times sit to stand test in < 15 seconds without assistance from walker for standing balance indicating an increased LE strength and improved balance. Baseline: 18.77 with significant UE use and with walker for balance 16.96 sec with UE assist on walker 10/26: 20.87 seconds Goal status: IN PROGRESS  2.  Patient will increase FOTO score to equal to or greater than  57   to demonstrate statistically significant improvement in mobility and quality of life.  Baseline: 47 9/13: 67 Goal status: MET    3.  Patient will reduce timed up and go to <11 seconds to reduce fall risk and demonstrate improved transfer/gait ability. Baseline: 76.68 9/13:51.07 sec 10/26: 29.94 seconds with RW  Goal status: IN PROGRESS  4.  Patient will increase 10 meter walk test to >.56ms as to improve gait speed for better community ambulation and to reduce fall risk. Baseline: 8/4: 21.36 sec (.423m) 9/13:22.93 10/26: 18.16 seconds =0.55 m/s  Goal status: IN PROGRESS  5.  Pt will stand independently without UE assist and without LOB for 3 min in order to indicate improved standing endurance and LE strength for cooking and meal prep activities .  Baseline: 50 seconds 9/13: 55 seconds no UE, 1:30 with UE assist 10/26: 58 seconds no UE support Goal status: IN PROGRESS   ASSESSMENT:  CLINICAL IMPRESSION:  ***    OBJECTIVE IMPAIRMENTS Abnormal gait, decreased activity tolerance, decreased balance, decreased mobility, difficulty walking, decreased strength, impaired perceived functional ability, and impaired flexibility.   ACTIVITY LIMITATIONS bending, standing, squatting, stairs, transfers, and locomotion level  PARTICIPATION LIMITATIONS: meal prep, cleaning, laundry, medication management, driving, and community activity  PERSONAL  FACTORS Age and 3+ comorbidities: CHF, COPD, HLD, HTN  are also affecting patient's functional outcome.   REHAB POTENTIAL: Fair very deconditioned and has had significant therapy to this point. Also his motivation for HEPcompletion is low.   CLINICAL DECISION MAKING: Evolving/moderate complexity  EVALUATION COMPLEXITY: Moderate  PLAN: PT FREQUENCY: 2x/week  PT DURATION: 12 weeks  PLANNED INTERVENTIONS: Therapeutic exercises, Therapeutic activity, Neuromuscular re-education, Balance training, Gait training, Patient/Family education, Self Care, Joint mobilization, Dry Needling, and Manual therapy  PLAN FOR NEXT SESSION:  Continue to progress standing and walking exercises to address both deconditioning and LE weakness.    JoGwenlyn SaranPT, DPT Physical Therapist- [facility]11/06/23, 5:09 PM

## 2022-06-29 ENCOUNTER — Ambulatory Visit: Payer: Medicare Other

## 2022-07-01 ENCOUNTER — Ambulatory Visit: Payer: Medicare Other

## 2022-07-01 NOTE — Therapy (Incomplete)
OUTPATIENT PHYSICAL THERAPY NEURO TREATMENT     Patient Name: Christopher [name] MRN: [id number] DOB:[birth year], 71 y.o., male Today's Date: 07/01/2022   PCP: Rusty Aus, MD REFERRING PROVIDER: Rusty Aus, MD        Past Medical History:  Diagnosis Date   Anxiety    CHF (congestive heart failure) ([place])    COPD (chronic obstructive pulmonary disease) ([place])    Depression    Dysrhythmia    Hyperlipidemia    Hypertension    Hypothyroidism    Sleep apnea    Past Surgical History:  Procedure Laterality Date   FRACTURE SURGERY     ORIF ANKLE FRACTURE Left 12/17/2020   Procedure: OPEN REDUCTION INTERNAL FIXATION (ORIF) ANKLE FRACTURE;  Surgeon: Samara Deist, DPM;  Location: [facility] ORS;  Service: Podiatry;  Laterality: Left;   Patient Active Problem List   Diagnosis Date Noted   Generalized weakness 10/16/2021   Chronic respiratory failure with hypoxia (HCC) 10/16/2021   Hypotension    SIRS (systemic inflammatory response syndrome) (HCC)    AF (paroxysmal atrial fibrillation) ([place])    Syncope    Severe sepsis ([place]) 05/20/2021   Pressure injury of skin 04/25/2021   Acute respiratory failure ([place]) 04/21/2021   Acute kidney injury superimposed on CKD ([place]) 04/21/2021   Stage 3b chronic kidney disease (CKD) ([place]) 03/22/2021   Leukocytosis 03/22/2021   Osteomyelitis of ankle or foot, acute, left ([place]) 03/17/2021   Acute on chronic respiratory failure ([place]) 03/17/2021   Osteomyelitis ([place]) 12/16/2020   Depression 12/16/2020   Medicare annual wellness visit, initial 11/14/2019   B12 deficiency 11/14/2019   Hyperlipidemia 10/31/2019   Primary osteoarthritis of right knee 03/16/2019   Sleep apnea 10/31/2018   Traumatic incomplete tear of left rotator [name] 10/02/2018   Morbid obesity with BMI of 45.0-49.9, adult ([place]) 09/20/2018   Hypothyroidism 09/20/2018   Chronic diastolic CHF (congestive heart failure) ([place]) 09/20/2018   Benign essential hypertension 09/20/2018     ONSET DATE: 10/16/20  REFERRING DIAG: R27.0 (ICD-10-CM) - Ataxia, unspecified  THERAPY DIAG:  No diagnosis found.  Rationale for Evaluation and Treatment Rehabilitation  SUBJECTIVE:                                                                                                                                                                                             SUBJECTIVE STATEMENT:  ***  Pt. Reports they have been doing some of their seated leg strengthening exercises at home and have been doing a lot of get up / down over the past week.   Pt accompanied by: family member DTR [name])  PERTINENT HISTORY: Pt is s/p ankle fracture February 2022. Patient reports independence with most activities prior to initial injury of his ankle.  [name] to STR at [facility], then OPPT at [place] [facility]. Pt has struggled to return to premorbid status. Patient recently started medication for new depression which began after wife passing away this year.  Depression has been a barrier to compliance with HEP in home. Pt reports progressive deconditioning 2/2 falls anxiety.  PAIN:  Are you having pain? No  PRECAUTIONS: Fall  WEIGHT BEARING RESTRICTIONS No  LIVING ENVIRONMENT: Lives with: lives with their family Lives in: House/apartment Stairs: Yes: External: 1 steps; none 4 steps in garage with bilateral rails that are difficulty to grip.  Has following equipment at home: Gilford Rile - 2 wheeled, Environmental consultant - 4 wheeled, and shower chair  PLOF: Independent with household mobility with device, Independent with homemaking with ambulation, Independent with gait, and Leisure: making rings out of coins   PATIENT GOALS Improve ability to cook, be able to access farmer's market independently. Be able to go into restaurants.    TODAY'S TREATMENT: 07/01/22  ***  In // bars:  - walking forward 2x  - lateral side stepping 4x  - alternating marches into GTB 10x ea - Step ups on 2  inch step (R) leg first 5x - Step ups on 2 inch step (L) leg first 5x  - Alternating foot taps on 2 inch step 10xea - Hedgehog lateral steps 5x ea  [place]o ball standing tolerance  - 2 rounds bringing balls up and back down   Vitals: 98 O2 sat, 105 HR during standing exercises  RPE: 5-6/10   Seated:  - Ball squeezes 15x  - Yellow weighted ball seated marches with back off backrest 10x  - Yellow weighted ball arcs with back off backrest for core stability 10x  - Seated hip flexor straight leg raises side to side 10x ea leg   Pt experiences some fatigue throughout and requires rest breaks.   PATIENT EDUCATION: Education details: Pt educated throughout session about proper posture and technique with exercises. Improved exercise technique, movement at target joints, use of target muscles after min to mod verbal, visual, tactile cues.  Person educated: Patient, DTR Education method: Explanation, Demonstration, and Verbal cues Education comprehension: verbalized understanding, returned demonstration, verbal cues required, and needs further education   HOME EXERCISE PROGRAM: No updates today, pt to continue HEP as previously given. Educated to set timer every 1-2hrs and complete walking/HEP for improved endurance Access Code: [email or web address] URL: [email or web address] Date: 03/26/2022 Prepared by: Rivka Barbara  Exercises - Seated March  - 1 x daily - 7 x weekly - 2 sets - 10 reps - Seated Long Arc Quad  - 1 x daily - 7 x weekly - 2 sets - 10 reps - Seated single leg step to side   - 1 x daily - 7 x weekly - 2 sets - 10 reps - Sit to Stand with Counter Support  - 1 x daily - 7 x weekly - 1 sets - 5 reps  GOALS Goals reviewed with patient? Yes  SHORT TERM GOALS: Target date: 07/29/2022   Patient will be independent in home exercise program to improve strength/mobility for better functional independence with ADLs. Baseline: No HEP currently 9/13: Pt not compliant with HEP  nut doing some of the exercises intermittently  10/26: education on compliance  Goal status: NOT MET  LONG TERM GOALS: Target date: 09/09/2022   1.  Patient (> 35 years old) will complete five times sit to stand test in < 15 seconds without assistance from walker for standing balance indicating an increased LE strength and improved balance. Baseline: 18.77 with significant UE use and with walker for balance 16.96 sec with UE assist on walker 10/26: 20.87 seconds Goal status: IN PROGRESS  2.  Patient will increase FOTO score to equal to or greater than  57   to demonstrate statistically significant improvement in mobility and quality of life.  Baseline: 47 9/13: 67 Goal status: MET    3.  Patient will reduce timed up and go to <11 seconds to reduce fall risk and demonstrate improved transfer/gait ability. Baseline: 76.68 9/13:51.07 sec 10/26: 29.94 seconds with RW  Goal status: IN PROGRESS  4.  Patient will increase 10 meter walk test to >.39ms as to improve gait speed for better community ambulation and to reduce fall risk. Baseline: 8/4: 21.36 sec (.443m) 9/13:22.93 10/26: 18.16 seconds =0.55 m/s  Goal status: IN PROGRESS  5.  Pt will stand independently without UE assist and without LOB for 3 min in order to indicate improved standing endurance and LE strength for cooking and meal prep activities .  Baseline: 50 seconds 9/13: 55 seconds no UE, 1:30 with UE assist 10/26: 58 seconds no UE support Goal status: IN PROGRESS   ASSESSMENT:  CLINICAL IMPRESSION:  ***    OBJECTIVE IMPAIRMENTS Abnormal gait, decreased activity tolerance, decreased balance, decreased mobility, difficulty walking, decreased strength, impaired perceived functional ability, and impaired flexibility.   ACTIVITY LIMITATIONS bending, standing, squatting, stairs, transfers, and locomotion level  PARTICIPATION LIMITATIONS: meal prep, cleaning, laundry, medication management, driving, and community  activity  PERSONAL FACTORS Age and 3+ comorbidities: CHF, COPD, HLD, HTN  are also affecting patient's functional outcome.   REHAB POTENTIAL: Fair very deconditioned and has had significant therapy to this point. Also his motivation for HEPcompletion is low.   CLINICAL DECISION MAKING: Evolving/moderate complexity  EVALUATION COMPLEXITY: Moderate  PLAN: PT FREQUENCY: 2x/week  PT DURATION: 12 weeks  PLANNED INTERVENTIONS: Therapeutic exercises, Therapeutic activity, Neuromuscular re-education, Balance training, Gait training, Patient/Family education, Self Care, Joint mobilization, Dry Needling, and Manual therapy  PLAN FOR NEXT SESSION:  *** Continue to progress standing and walking exercises to address both deconditioning and LE weakness.    JoGwenlyn SaranPT, DPT Physical Therapist- CoThe Center For Specialized Surgery LP11/09/23, 8:18 AM

## 2022-07-06 ENCOUNTER — Ambulatory Visit: Payer: Medicare Other

## 2022-07-06 DIAGNOSIS — R262 Difficulty in walking, not elsewhere classified: Secondary | ICD-10-CM

## 2022-07-06 DIAGNOSIS — R269 Unspecified abnormalities of gait and mobility: Secondary | ICD-10-CM

## 2022-07-06 DIAGNOSIS — R2689 Other abnormalities of gait and mobility: Secondary | ICD-10-CM

## 2022-07-06 DIAGNOSIS — M6281 Muscle weakness (generalized): Secondary | ICD-10-CM

## 2022-07-06 DIAGNOSIS — R2681 Unsteadiness on feet: Secondary | ICD-10-CM

## 2022-07-06 NOTE — Therapy (Signed)
OUTPATIENT PHYSICAL THERAPY NEURO TREATMENT     Patient Name: Christopher [name] MRN: [id number] DOB:Aug 04, 1951, 71 y.o., male Today's Date: 07/06/2022   PCP: Rusty Aus, MD REFERRING PROVIDER: Rusty Aus, MD   PT End of Session - 07/06/22 0946     Visit Number 21    Number of Visits 42    Date for PT Re-Evaluation 09/09/22    Authorization Type [employer] Medicare    Authorization Time Period 03/22/22-06/14/22    Progress Note Due on Visit 30    PT Start Time 0945    PT Stop Time 1015    PT Time Calculation (min) 30 min    Equipment Utilized During Treatment Gait belt    Activity Tolerance Patient tolerated treatment well;No increased pain    Behavior During Therapy WFL for tasks assessed/performed                 Past Medical History:  Diagnosis Date   Anxiety    CHF (congestive heart failure) (HCC)    COPD (chronic obstructive pulmonary disease) ([place])    Depression    Dysrhythmia    Hyperlipidemia    Hypertension    Hypothyroidism    Sleep apnea    Past Surgical History:  Procedure Laterality Date   FRACTURE SURGERY     ORIF ANKLE FRACTURE Left 12/17/2020   Procedure: OPEN REDUCTION INTERNAL FIXATION (ORIF) ANKLE FRACTURE;  Surgeon: Samara Deist, DPM;  Location: [facility] ORS;  Service: Podiatry;  Laterality: Left;   Patient Active Problem List   Diagnosis Date Noted   Generalized weakness 10/16/2021   Chronic respiratory failure with hypoxia (HCC) 10/16/2021   Hypotension    SIRS (systemic inflammatory response syndrome) (HCC)    AF (paroxysmal atrial fibrillation) ([place])    Syncope    Severe sepsis ([place]) 05/20/2021   Pressure injury of skin 04/25/2021   Acute respiratory failure ([place]) 04/21/2021   Acute kidney injury superimposed on CKD ([place]) 04/21/2021   Stage 3b chronic kidney disease (CKD) ([place]) 03/22/2021   Leukocytosis 03/22/2021   Osteomyelitis of ankle or foot, acute, left ([place]) 03/17/2021   Acute on chronic respiratory failure ([place])  03/17/2021   Osteomyelitis ([place]) 12/16/2020   Depression 12/16/2020   Medicare annual wellness visit, initial 11/14/2019   B12 deficiency 11/14/2019   Hyperlipidemia 10/31/2019   Primary osteoarthritis of right knee 03/16/2019   Sleep apnea 10/31/2018   Traumatic incomplete tear of left rotator cuff 10/02/2018   Morbid obesity with BMI of 45.0-49.9, adult ([place]) 09/20/2018   Hypothyroidism 09/20/2018   Chronic diastolic CHF (congestive heart failure) ([place]) 09/20/2018   Benign essential hypertension 09/20/2018    ONSET DATE: 10/16/20  REFERRING DIAG: R27.0 (ICD-10-CM) - Ataxia, unspecified  THERAPY DIAG:  Difficulty in walking, not elsewhere classified  Muscle weakness (generalized)  Unsteadiness on feet  Abnormality of gait and mobility  Other abnormalities of gait and mobility  Rationale for Evaluation and Treatment Rehabilitation  SUBJECTIVE:  SUBJECTIVE STATEMENT:  Pt reports falling out of the shower last week on Wednesday night.  Pt arrives to the clinic 15 minutes late.   Pt accompanied by: family member DTR [name])   PERTINENT HISTORY: Pt is s/p ankle fracture February 2022. Patient reports independence with most activities prior to initial injury of his ankle.  [name] to STR at [facility], then OPPT at [place] [facility]. Pt has struggled to return to premorbid status. Patient recently started medication for new depression which began after wife passing away this year.  Depression has been a barrier to compliance with HEP in home. Pt reports progressive deconditioning 2/2 falls anxiety.  PAIN:  Are you having pain? No  PRECAUTIONS: Fall  WEIGHT BEARING RESTRICTIONS No  LIVING ENVIRONMENT: Lives with: lives with their family Lives in: House/apartment Stairs: Yes:  External: 1 steps; none 4 steps in garage with bilateral rails that are difficulty to grip.  Has following equipment at home: Gilford Rile - 2 wheeled, Environmental consultant - 4 wheeled, and shower chair  PLOF: Independent with household mobility with device, Independent with homemaking with ambulation, Independent with gait, and Leisure: making rings out of coins   PATIENT GOALS Improve ability to cook, be able to access farmer's market independently. Be able to go into restaurants.    TODAY'S TREATMENT: 07/06/22  At support bar:  - Hedgehog lateral steps 5x ea - STS, x5 with heavy use of the support bar to pull up from.  Seated:  - Ball squeezes 15x  - Yellow weighted ball in outstretched arms seated marches with back off backrest 2x10 - Yellow weighted ball arcs with back off backrest for core stability 2x10  - Seated hip flexor straight leg raises side to side 10x ea leg   Pt experiences some fatigue throughout and requires rest breaks.   Assisted pt in adjusting brakes for the wheelchair in order to improve safety while in use.    PATIENT EDUCATION: Education details: Pt educated throughout session about proper posture and technique with exercises. Improved exercise technique, movement at target joints, use of target muscles after min to mod verbal, visual, tactile cues.  Person educated: Patient, DTR Education method: Explanation, Demonstration, and Verbal cues Education comprehension: verbalized understanding, returned demonstration, verbal cues required, and needs further education   HOME EXERCISE PROGRAM: No updates today, pt to continue HEP as previously given. Educated to set timer every 1-2hrs and complete walking/HEP for improved endurance Access Code: [email or web address] URL: [email or web address] Date: 03/26/2022 Prepared by: Rivka Barbara  Exercises - Seated March  - 1 x daily - 7 x weekly - 2 sets - 10 reps - Seated Long Arc Quad  - 1 x daily - 7 x weekly - 2 sets - 10  reps - Seated single leg step to side   - 1 x daily - 7 x weekly - 2 sets - 10 reps - Sit to Stand with Counter Support  - 1 x daily - 7 x weekly - 1 sets - 5 reps  GOALS Goals reviewed with patient? Yes  SHORT TERM GOALS: Target date: 07/29/2022   Patient will be independent in home exercise program to improve strength/mobility for better functional independence with ADLs. Baseline: No HEP currently 9/13: Pt not compliant with HEP nut doing some of the exercises intermittently  10/26: education on compliance  Goal status: NOT MET  LONG TERM GOALS: Target date: 09/09/2022   1.  Patient (> 85 years old) will complete five times sit to stand  test in < 15 seconds without assistance from walker for standing balance indicating an increased LE strength and improved balance. Baseline: 18.77 with significant UE use and with walker for balance 16.96 sec with UE assist on walker 10/26: 20.87 seconds Goal status: IN PROGRESS  2.  Patient will increase FOTO score to equal to or greater than  57   to demonstrate statistically significant improvement in mobility and quality of life.  Baseline: 47 9/13: 67 Goal status: MET    3.  Patient will reduce timed up and go to <11 seconds to reduce fall risk and demonstrate improved transfer/gait ability. Baseline: 76.68 9/13:51.07 sec 10/26: 29.94 seconds with RW  Goal status: IN PROGRESS  4.  Patient will increase 10 meter walk test to >.49ms as to improve gait speed for better community ambulation and to reduce fall risk. Baseline: 8/4: 21.36 sec (.480m) 9/13:22.93 10/26: 18.16 seconds =0.55 m/s  Goal status: IN PROGRESS  5.  Pt will stand independently without UE assist and without LOB for 3 min in order to indicate improved standing endurance and LE strength for cooking and meal prep activities .  Baseline: 50 seconds 9/13: 55 seconds no UE, 1:30 with UE assist 10/26: 58 seconds no UE support Goal status: IN PROGRESS   ASSESSMENT:  CLINICAL  IMPRESSION:  Unable to perform a significant amount of therapeutic exercise due to pt consistently showing up to the clinic late.  Pt also continues to be fatigued throughout the sessions due to lack of cardiovascular deficits at this time.  Pt advised to show up earlier to next appointment to have more time for therapy and to allow for more time for // bar use.   Pt will continue to benefit from skilled therapy to address remaining deficits in order to improve overall QoL and return to PLOF.       OBJECTIVE IMPAIRMENTS Abnormal gait, decreased activity tolerance, decreased balance, decreased mobility, difficulty walking, decreased strength, impaired perceived functional ability, and impaired flexibility.   ACTIVITY LIMITATIONS bending, standing, squatting, stairs, transfers, and locomotion level  PARTICIPATION LIMITATIONS: meal prep, cleaning, laundry, medication management, driving, and community activity  PERSONAL FACTORS Age and 3+ comorbidities: CHF, COPD, HLD, HTN  are also affecting patient's functional outcome.   REHAB POTENTIAL: Fair very deconditioned and has had significant therapy to this point. Also his motivation for HEPcompletion is low.   CLINICAL DECISION MAKING: Evolving/moderate complexity  EVALUATION COMPLEXITY: Moderate  PLAN: PT FREQUENCY: 2x/week  PT DURATION: 12 weeks  PLANNED INTERVENTIONS: Therapeutic exercises, Therapeutic activity, Neuromuscular re-education, Balance training, Gait training, Patient/Family education, Self Care, Joint mobilization, Dry Needling, and Manual therapy  PLAN FOR NEXT SESSION:   Continue to progress standing and walking exercises to address both deconditioning and LE weakness.    JoGwenlyn SaranPT, DPT Physical Therapist- CoKessler Institute For [facility] Facility11/14/23, 9:46 AM

## 2022-07-14 ENCOUNTER — Ambulatory Visit: Payer: Medicare Other

## 2022-07-14 NOTE — Therapy (Incomplete)
OUTPATIENT PHYSICAL THERAPY NEURO TREATMENT     Patient Name: Christopher [name] MRN: [id number] DOB:October 30, 1950, 71 y.o., male Today's Date: 07/14/2022   PCP: Rusty Aus, MD REFERRING PROVIDER: Rusty Aus, MD         Past Medical History:  Diagnosis Date   Anxiety    CHF (congestive heart failure) ([place])    COPD (chronic obstructive pulmonary disease) ([place])    Depression    Dysrhythmia    Hyperlipidemia    Hypertension    Hypothyroidism    Sleep apnea    Past Surgical History:  Procedure Laterality Date   FRACTURE SURGERY     ORIF ANKLE FRACTURE Left 12/17/2020   Procedure: OPEN REDUCTION INTERNAL FIXATION (ORIF) ANKLE FRACTURE;  Surgeon: Samara Deist, DPM;  Location: [facility] ORS;  Service: Podiatry;  Laterality: Left;   Patient Active Problem List   Diagnosis Date Noted   Generalized weakness 10/16/2021   Chronic respiratory failure with hypoxia (HCC) 10/16/2021   Hypotension    SIRS (systemic inflammatory response syndrome) (HCC)    AF (paroxysmal atrial fibrillation) ([place])    Syncope    Severe sepsis ([place]) 05/20/2021   Pressure injury of skin 04/25/2021   Acute respiratory failure ([place]) 04/21/2021   Acute kidney injury superimposed on CKD ([place]) 04/21/2021   Stage 3b chronic kidney disease (CKD) ([place]) 03/22/2021   Leukocytosis 03/22/2021   Osteomyelitis of ankle or foot, acute, left ([place]) 03/17/2021   Acute on chronic respiratory failure ([place]) 03/17/2021   Osteomyelitis ([place]) 12/16/2020   Depression 12/16/2020   Medicare annual wellness visit, initial 11/14/2019   B12 deficiency 11/14/2019   Hyperlipidemia 10/31/2019   Primary osteoarthritis of right knee 03/16/2019   Sleep apnea 10/31/2018   Traumatic incomplete tear of left rotator cuff 10/02/2018   Morbid obesity with BMI of 45.0-49.9, adult ([place]) 09/20/2018   Hypothyroidism 09/20/2018   Chronic diastolic CHF (congestive heart failure) ([place]) 09/20/2018   Benign essential hypertension 09/20/2018     ONSET DATE: 10/16/20  REFERRING DIAG: R27.0 (ICD-10-CM) - Ataxia, unspecified  THERAPY DIAG:  No diagnosis found.  Rationale for Evaluation and Treatment Rehabilitation  SUBJECTIVE:                                                                                                                                                                                             SUBJECTIVE STATEMENT: *** Pt reports falling out of the shower last week on Wednesday night.  Pt arrives to the clinic 15 minutes late.   Pt accompanied by: family member DTR [name])   PERTINENT HISTORY: Pt is s/p ankle fracture February 2022. Patient  reports independence with most activities prior to initial injury of his ankle.  [name] to STR at [facility], then OPPT at [facility]. Pt has struggled to return to premorbid status. Patient recently started medication for new depression which began after wife passing away this year.  Depression has been a barrier to compliance with HEP in home. Pt reports progressive deconditioning 2/2 falls anxiety.  PAIN:  Are you having pain? No  PRECAUTIONS: Fall  WEIGHT BEARING RESTRICTIONS No  LIVING ENVIRONMENT: Lives with: lives with their family Lives in: House/apartment Stairs: Yes: External: 1 steps; none 4 steps in garage with bilateral rails that are difficulty to grip.  Has following equipment at home: Gilford Rile - 2 wheeled, Environmental consultant - 4 wheeled, and shower chair  PLOF: Independent with household mobility with device, Independent with homemaking with ambulation, Independent with gait, and Leisure: making rings out of coins   PATIENT GOALS Improve ability to cook, be able to access farmer's market independently. Be able to go into restaurants.    TODAY'S TREATMENT: 07/14/22 *** At support bar:  - Hedgehog lateral steps 5x ea - STS, x5 with heavy use of the support bar to pull up from.  Seated:  - Ball squeezes 15x  - Yellow weighted ball in  outstretched arms seated marches with back off backrest 2x10 - Yellow weighted ball arcs with back off backrest for core stability 2x10  - Seated hip flexor straight leg raises side to side 10x ea leg   Pt experiences some fatigue throughout and requires rest breaks.   Assisted pt in adjusting brakes for the wheelchair in order to improve safety while in use.    PATIENT EDUCATION: Education details: Pt educated throughout session about proper posture and technique with exercises. Improved exercise technique, movement at target joints, use of target muscles after min to mod verbal, visual, tactile cues.  Person educated: Patient, DTR Education method: Explanation, Demonstration, and Verbal cues Education comprehension: verbalized understanding, returned demonstration, verbal cues required, and needs further education   HOME EXERCISE PROGRAM: No updates today, pt to continue HEP as previously given. Educated to set timer every 1-2hrs and complete walking/HEP for improved endurance Access Code: [email or web address] URL: [email or web address] Date: 03/26/2022 Prepared by: Rivka Barbara  Exercises - Seated March  - 1 x daily - 7 x weekly - 2 sets - 10 reps - Seated Long Arc Quad  - 1 x daily - 7 x weekly - 2 sets - 10 reps - Seated single leg step to side   - 1 x daily - 7 x weekly - 2 sets - 10 reps - Sit to Stand with Counter Support  - 1 x daily - 7 x weekly - 1 sets - 5 reps  GOALS Goals reviewed with patient? Yes  SHORT TERM GOALS: Target date: 07/29/2022   Patient will be independent in home exercise program to improve strength/mobility for better functional independence with ADLs. Baseline: No HEP currently 9/13: Pt not compliant with HEP nut doing some of the exercises intermittently  10/26: education on compliance  Goal status: NOT MET  LONG TERM GOALS: Target date: 09/09/2022   1.  Patient (> 26 years old) will complete five times sit to stand test in < 15 seconds  without assistance from walker for standing balance indicating an increased LE strength and improved balance. Baseline: 18.77 with significant UE use and with walker for balance 16.96 sec with UE assist on walker 10/26: 20.87 seconds Goal status: IN PROGRESS  2.  Patient will increase FOTO score to equal to or greater than  57   to demonstrate statistically significant improvement in mobility and quality of life.  Baseline: 47 9/13: 67 Goal status: MET    3.  Patient will reduce timed up and go to <11 seconds to reduce fall risk and demonstrate improved transfer/gait ability. Baseline: 76.68 9/13:51.07 sec 10/26: 29.94 seconds with RW  Goal status: IN PROGRESS  4.  Patient will increase 10 meter walk test to >.47ms as to improve gait speed for better community ambulation and to reduce fall risk. Baseline: 8/4: 21.36 sec (.451m) 9/13:22.93 10/26: 18.16 seconds =0.55 m/s  Goal status: IN PROGRESS  5.  Pt will stand independently without UE assist and without LOB for 3 min in order to indicate improved standing endurance and LE strength for cooking and meal prep activities .  Baseline: 50 seconds 9/13: 55 seconds no UE, 1:30 with UE assist 10/26: 58 seconds no UE support Goal status: IN PROGRESS   ASSESSMENT:  CLINICAL IMPRESSION: *** Unable to perform a significant amount of therapeutic exercise due to pt consistently showing up to the clinic late.  Pt also continues to be fatigued throughout the sessions due to lack of cardiovascular deficits at this time.  Pt advised to show up earlier to next appointment to have more time for therapy and to allow for more time for // bar use.   Pt will continue to benefit from skilled therapy to address remaining deficits in order to improve overall QoL and return to PLOF.       OBJECTIVE IMPAIRMENTS Abnormal gait, decreased activity tolerance, decreased balance, decreased mobility, difficulty walking, decreased strength, impaired perceived  functional ability, and impaired flexibility.   ACTIVITY LIMITATIONS bending, standing, squatting, stairs, transfers, and locomotion level  PARTICIPATION LIMITATIONS: meal prep, cleaning, laundry, medication management, driving, and community activity  PERSONAL FACTORS Age and 3+ comorbidities: CHF, COPD, HLD, HTN  are also affecting patient's functional outcome.   REHAB POTENTIAL: Fair very deconditioned and has had significant therapy to this point. Also his motivation for HEPcompletion is low.   CLINICAL DECISION MAKING: Evolving/moderate complexity  EVALUATION COMPLEXITY: Moderate  PLAN: PT FREQUENCY: 2x/week  PT DURATION: 12 weeks  PLANNED INTERVENTIONS: Therapeutic exercises, Therapeutic activity, Neuromuscular re-education, Balance training, Gait training, Patient/Family education, Self Care, Joint mobilization, Dry Needling, and Manual therapy  PLAN FOR NEXT SESSION:  *** Continue to progress standing and walking exercises to address both deconditioning and LE weakness.    JoGwenlyn SaranPT, DPT Physical Therapist- [facility]11/22/23, 8:42 AM

## 2022-07-19 NOTE — Therapy (Signed)
OUTPATIENT PHYSICAL THERAPY NEURO TREATMENT     Patient Name: Christopher [name] MRN: [id number] DOB:1951-06-23, 71 y.o., male Today's Date: 07/20/2022   PCP: Rusty Aus, MD REFERRING PROVIDER: Rusty Aus, MD   PT End of Session - 07/20/22 1439     Visit Number 22    Number of Visits 42    Date for PT Re-Evaluation 09/09/22    Authorization Type [employer] Medicare    Authorization Time Period 03/22/22-06/14/22    Progress Note Due on Visit 30    PT Start Time 1438    PT Stop Time 1516    PT Time Calculation (min) 38 min    Equipment Utilized During Treatment Gait belt    Activity Tolerance Patient tolerated treatment well;No increased pain    Behavior During Therapy WFL for tasks assessed/performed                  Past Medical History:  Diagnosis Date   Anxiety    CHF (congestive heart failure) (HCC)    COPD (chronic obstructive pulmonary disease) ([place])    Depression    Dysrhythmia    Hyperlipidemia    Hypertension    Hypothyroidism    Sleep apnea    Past Surgical History:  Procedure Laterality Date   FRACTURE SURGERY     ORIF ANKLE FRACTURE Left 12/17/2020   Procedure: OPEN REDUCTION INTERNAL FIXATION (ORIF) ANKLE FRACTURE;  Surgeon: Samara Deist, DPM;  Location: [facility] ORS;  Service: Podiatry;  Laterality: Left;   Patient Active Problem List   Diagnosis Date Noted   Generalized weakness 10/16/2021   Chronic respiratory failure with hypoxia (HCC) 10/16/2021   Hypotension    SIRS (systemic inflammatory response syndrome) (HCC)    AF (paroxysmal atrial fibrillation) ([place])    Syncope    Severe sepsis ([place]) 05/20/2021   Pressure injury of skin 04/25/2021   Acute respiratory failure ([place]) 04/21/2021   Acute kidney injury superimposed on CKD ([place]) 04/21/2021   Stage 3b chronic kidney disease (CKD) ([place]) 03/22/2021   Leukocytosis 03/22/2021   Osteomyelitis of ankle or foot, acute, left ([place]) 03/17/2021   Acute on chronic respiratory failure ([place])  03/17/2021   Osteomyelitis ([place]) 12/16/2020   Depression 12/16/2020   Medicare annual wellness visit, initial 11/14/2019   B12 deficiency 11/14/2019   Hyperlipidemia 10/31/2019   Primary osteoarthritis of right knee 03/16/2019   Sleep apnea 10/31/2018   Traumatic incomplete tear of left rotator cuff 10/02/2018   Morbid obesity with BMI of 45.0-49.9, adult ([place]) 09/20/2018   Hypothyroidism 09/20/2018   Chronic diastolic CHF (congestive heart failure) ([place]) 09/20/2018   Benign essential hypertension 09/20/2018    ONSET DATE: 10/16/20  REFERRING DIAG: R27.0 (ICD-10-CM) - Ataxia, unspecified  THERAPY DIAG:  Difficulty in walking, not elsewhere classified  Muscle weakness (generalized)  Unsteadiness on feet  Abnormality of gait and mobility  Rationale for Evaluation and Treatment Rehabilitation  SUBJECTIVE:  SUBJECTIVE STATEMENT:  Patient reports intermittent compliance with HEP. Reports he wasn't here last week due to his feet hurting.    Pt accompanied by: family member DTR [name])   PERTINENT HISTORY: Pt is s/p ankle fracture February 2022. Patient reports independence with most activities prior to initial injury of his ankle.  [name] to STR at [facility], then OPPT at [facility]. Pt has struggled to return to premorbid status. Patient recently started medication for new depression which began after wife passing away this year.  Depression has been a barrier to compliance with HEP in home. Pt reports progressive deconditioning 2/2 falls anxiety.  PAIN:  Are you having pain? No  PRECAUTIONS: Fall  WEIGHT BEARING RESTRICTIONS No  LIVING ENVIRONMENT: Lives with: lives with their family Lives in: House/apartment Stairs: Yes: External: 1 steps; none 4 steps in garage  with bilateral rails that are difficulty to grip.  Has following equipment at home: Gilford Rile - 2 wheeled, Environmental consultant - 4 wheeled, and shower chair  PLOF: Independent with household mobility with device, Independent with homemaking with ambulation, Independent with gait, and Leisure: making rings out of coins   PATIENT GOALS Improve ability to cook, be able to access farmer's market independently. Be able to go into restaurants.    TODAY'S TREATMENT: 07/20/22  In // bars:  -ambulate forwards/backwards 4x length of // bars; heavy BUE support -step over half foam roller and back 10x each LE; heavy BUE support -lateral step over half foam roller and back 10x  each side; terminated at 7 on LLE due to patient reporting feeling uncomfortable -GTB march in // bars -static stand alternate tapping the A and B sticky note on mirror for balance 10x each side -standing heel raise 10x  -stand on airex pad 20 seconds; no UE support  Seated:  4lb ankle weight:  -march 10x each LE; x2 sets; second set with upright posture -LAQ 10x each LE; x2 sets; hold 3 seconds  -heel raise 20x -lateral step over theraband 10x each LE -adduction ball squeeze 10x  PATIENT EDUCATION: Education details: Pt educated throughout session about proper posture and technique with exercises. Improved exercise technique, movement at target joints, use of target muscles after min to mod verbal, visual, tactile cues.  Person educated: Patient, DTR Education method: Explanation, Demonstration, and Verbal cues Education comprehension: verbalized understanding, returned demonstration, verbal cues required, and needs further education   HOME EXERCISE PROGRAM: No updates today, pt to continue HEP as previously given. Educated to set timer every 1-2hrs and complete walking/HEP for improved endurance Access Code: [email or web address] URL: [email or web address] Date: 03/26/2022 Prepared by: Rivka Barbara  Exercises - Seated  March  - 1 x daily - 7 x weekly - 2 sets - 10 reps - Seated Long Arc Quad  - 1 x daily - 7 x weekly - 2 sets - 10 reps - Seated single leg step to side   - 1 x daily - 7 x weekly - 2 sets - 10 reps - Sit to Stand with Counter Support  - 1 x daily - 7 x weekly - 1 sets - 5 reps  GOALS Goals reviewed with patient? Yes  SHORT TERM GOALS: Target date: 07/29/2022   Patient will be independent in home exercise program to improve strength/mobility for better functional independence with ADLs. Baseline: No HEP currently 9/13: Pt not compliant with HEP nut doing some of the exercises intermittently  10/26: education on compliance  Goal status: NOT MET  LONG  TERM GOALS: Target date: 09/09/2022   1.  Patient (> 29 years old) will complete five times sit to stand test in < 15 seconds without assistance from walker for standing balance indicating an increased LE strength and improved balance. Baseline: 18.77 with significant UE use and with walker for balance 16.96 sec with UE assist on walker 10/26: 20.87 seconds Goal status: IN PROGRESS  2.  Patient will increase FOTO score to equal to or greater than  57   to demonstrate statistically significant improvement in mobility and quality of life.  Baseline: 47 9/13: 67 Goal status: MET    3.  Patient will reduce timed up and go to <11 seconds to reduce fall risk and demonstrate improved transfer/gait ability. Baseline: 76.68 9/13:51.07 sec 10/26: 29.94 seconds with RW  Goal status: IN PROGRESS  4.  Patient will increase 10 meter walk test to >.53ms as to improve gait speed for better community ambulation and to reduce fall risk. Baseline: 8/4: 21.36 sec (.460m) 9/13:22.93 10/26: 18.16 seconds =0.55 m/s  Goal status: IN PROGRESS  5.  Pt will stand independently without UE assist and without LOB for 3 min in order to indicate improved standing endurance and LE strength for cooking and meal prep activities .  Baseline: 50 seconds 9/13: 55 seconds no  UE, 1:30 with UE assist 10/26: 58 seconds no UE support Goal status: IN PROGRESS   ASSESSMENT:  CLINICAL IMPRESSION:  Patient educated on need for attending therapy session on time for optimal progress. Additionally educated on need for compliance of HEP due to regression of strength noted during standing interventions this session. Patient verbalized understanding.  Pt will continue to benefit from skilled therapy to address remaining deficits in order to improve overall QoL and return to PLOF.       OBJECTIVE IMPAIRMENTS Abnormal gait, decreased activity tolerance, decreased balance, decreased mobility, difficulty walking, decreased strength, impaired perceived functional ability, and impaired flexibility.   ACTIVITY LIMITATIONS bending, standing, squatting, stairs, transfers, and locomotion level  PARTICIPATION LIMITATIONS: meal prep, cleaning, laundry, medication management, driving, and community activity  PERSONAL FACTORS Age and 3+ comorbidities: CHF, COPD, HLD, HTN  are also affecting patient's functional outcome.   REHAB POTENTIAL: Fair very deconditioned and has had significant therapy to this point. Also his motivation for HEPcompletion is low.   CLINICAL DECISION MAKING: Evolving/moderate complexity  EVALUATION COMPLEXITY: Moderate  PLAN: PT FREQUENCY: 2x/week  PT DURATION: 12 weeks  PLANNED INTERVENTIONS: Therapeutic exercises, Therapeutic activity, Neuromuscular re-education, Balance training, Gait training, Patient/Family education, Self Care, Joint mobilization, Dry Needling, and Manual therapy  PLAN FOR NEXT SESSION:   Continue to progress standing and walking exercises to address both deconditioning and LE weakness.   MaJanna ArchT Physical Therapist- Co[facility] LLC11/28/23, 3:16 PM

## 2022-07-20 ENCOUNTER — Ambulatory Visit: Payer: Medicare Other

## 2022-07-20 DIAGNOSIS — R262 Difficulty in walking, not elsewhere classified: Secondary | ICD-10-CM | POA: Diagnosis not present

## 2022-07-20 DIAGNOSIS — M6281 Muscle weakness (generalized): Secondary | ICD-10-CM

## 2022-07-20 DIAGNOSIS — R269 Unspecified abnormalities of gait and mobility: Secondary | ICD-10-CM

## 2022-07-20 DIAGNOSIS — R2681 Unsteadiness on feet: Secondary | ICD-10-CM

## 2022-07-21 NOTE — Therapy (Incomplete)
OUTPATIENT PHYSICAL THERAPY NEURO TREATMENT     Patient Name: Christopher [name] MRN: [id number] DOB:December 14, 1950, 71 y.o., male Today's Date: 07/21/2022   PCP: Rusty Aus, MD REFERRING PROVIDER: Rusty Aus, MD          Past Medical History:  Diagnosis Date   Anxiety    CHF (congestive heart failure) ([place])    COPD (chronic obstructive pulmonary disease) ([place])    Depression    Dysrhythmia    Hyperlipidemia    Hypertension    Hypothyroidism    Sleep apnea    Past Surgical History:  Procedure Laterality Date   FRACTURE SURGERY     ORIF ANKLE FRACTURE Left 12/17/2020   Procedure: OPEN REDUCTION INTERNAL FIXATION (ORIF) ANKLE FRACTURE;  Surgeon: Samara Deist, DPM;  Location: [facility] ORS;  Service: Podiatry;  Laterality: Left;   Patient Active Problem List   Diagnosis Date Noted   Generalized weakness 10/16/2021   Chronic respiratory failure with hypoxia (HCC) 10/16/2021   Hypotension    SIRS (systemic inflammatory response syndrome) (HCC)    AF (paroxysmal atrial fibrillation) ([place])    Syncope    Severe sepsis ([place]) 05/20/2021   Pressure injury of skin 04/25/2021   Acute respiratory failure ([place]) 04/21/2021   Acute kidney injury superimposed on CKD ([place]) 04/21/2021   Stage 3b chronic kidney disease (CKD) ([place]) 03/22/2021   Leukocytosis 03/22/2021   Osteomyelitis of ankle or foot, acute, left ([place]) 03/17/2021   Acute on chronic respiratory failure ([place]) 03/17/2021   Osteomyelitis ([place]) 12/16/2020   Depression 12/16/2020   Medicare annual wellness visit, initial 11/14/2019   B12 deficiency 11/14/2019   Hyperlipidemia 10/31/2019   Primary osteoarthritis of right knee 03/16/2019   Sleep apnea 10/31/2018   Traumatic incomplete tear of left rotator cuff 10/02/2018   Morbid obesity with BMI of 45.0-49.9, adult ([place]) 09/20/2018   Hypothyroidism 09/20/2018   Chronic diastolic CHF (congestive heart failure) ([place]) 09/20/2018   Benign essential hypertension  09/20/2018    ONSET DATE: 10/16/20  REFERRING DIAG: R27.0 (ICD-10-CM) - Ataxia, unspecified  THERAPY DIAG:  No diagnosis found.  Rationale for Evaluation and Treatment Rehabilitation  SUBJECTIVE:                                                                                                                                                                                             SUBJECTIVE STATEMENT: ***   Pt accompanied by: family member DTR [name])   PERTINENT HISTORY: Pt is s/p ankle fracture February 2022. Patient reports independence with most activities prior to initial injury of his ankle.  [name] to STR at [facility]  x 16M, then OPPT at [facility]. Pt has struggled to return to premorbid status. Patient recently started medication for new depression which began after wife passing away this year.  Depression has been a barrier to compliance with HEP in home. Pt reports progressive deconditioning 2/2 falls anxiety.  PAIN:  Are you having pain? No  PRECAUTIONS: Fall  WEIGHT BEARING RESTRICTIONS No  LIVING ENVIRONMENT: Lives with: lives with their family Lives in: House/apartment Stairs: Yes: External: 1 steps; none 4 steps in garage with bilateral rails that are difficulty to grip.  Has following equipment at home: Gilford Rile - 2 wheeled, Environmental consultant - 4 wheeled, and shower chair  PLOF: Independent with household mobility with device, Independent with homemaking with ambulation, Independent with gait, and Leisure: making rings out of coins   PATIENT GOALS Improve ability to cook, be able to access farmer's market independently. Be able to go into restaurants.    TODAY'S TREATMENT: 07/21/22  In // bars:  -ambulate forwards/backwards 4x length of // bars; heavy BUE support -step over half foam roller and back 10x each LE; heavy BUE support -lateral step over half foam roller and back 10x  each side; terminated at 7 on LLE due to patient reporting feeling  uncomfortable -GTB march in // bars -static stand alternate tapping the A and B sticky note on mirror for balance 10x each side -standing heel raise 10x  -stand on airex pad 20 seconds; no UE support  Seated:  4lb ankle weight:  -march 10x each LE; x2 sets; second set with upright posture -LAQ 10x each LE; x2 sets; hold 3 seconds  -heel raise 20x -lateral step over theraband 10x each LE -adduction ball squeeze 10x  PATIENT EDUCATION: Education details: Pt educated throughout session about proper posture and technique with exercises. Improved exercise technique, movement at target joints, use of target muscles after min to mod verbal, visual, tactile cues.  Person educated: Patient, DTR Education method: Explanation, Demonstration, and Verbal cues Education comprehension: verbalized understanding, returned demonstration, verbal cues required, and needs further education   HOME EXERCISE PROGRAM: No updates today, pt to continue HEP as previously given. Educated to set timer every 1-2hrs and complete walking/HEP for improved endurance Access Code: [email or web address] URL: [email or web address] Date: 03/26/2022 Prepared by: Rivka Barbara  Exercises - Seated March  - 1 x daily - 7 x weekly - 2 sets - 10 reps - Seated Long Arc Quad  - 1 x daily - 7 x weekly - 2 sets - 10 reps - Seated single leg step to side   - 1 x daily - 7 x weekly - 2 sets - 10 reps - Sit to Stand with Counter Support  - 1 x daily - 7 x weekly - 1 sets - 5 reps  GOALS Goals reviewed with patient? Yes  SHORT TERM GOALS: Target date: 07/29/2022   Patient will be independent in home exercise program to improve strength/mobility for better functional independence with ADLs. Baseline: No HEP currently 9/13: Pt not compliant with HEP nut doing some of the exercises intermittently  10/26: education on compliance  Goal status: NOT MET  LONG TERM GOALS: Target date: 09/09/2022   1.  Patient (> 6 years old)  will complete five times sit to stand test in < 15 seconds without assistance from walker for standing balance indicating an increased LE strength and improved balance. Baseline: 18.77 with significant UE use and with walker for balance 16.96 sec with UE assist on walker 10/26: 20.87 seconds  Goal status: IN PROGRESS  2.  Patient will increase FOTO score to equal to or greater than  57   to demonstrate statistically significant improvement in mobility and quality of life.  Baseline: 47 9/13: 67 Goal status: MET    3.  Patient will reduce timed up and go to <11 seconds to reduce fall risk and demonstrate improved transfer/gait ability. Baseline: 76.68 9/13:51.07 sec 10/26: 29.94 seconds with RW  Goal status: IN PROGRESS  4.  Patient will increase 10 meter walk test to >.56ms as to improve gait speed for better community ambulation and to reduce fall risk. Baseline: 8/4: 21.36 sec (.422m) 9/13:22.93 10/26: 18.16 seconds =0.55 m/s  Goal status: IN PROGRESS  5.  Pt will stand independently without UE assist and without LOB for 3 min in order to indicate improved standing endurance and LE strength for cooking and meal prep activities .  Baseline: 50 seconds 9/13: 55 seconds no UE, 1:30 with UE assist 10/26: 58 seconds no UE support Goal status: IN PROGRESS   ASSESSMENT:  CLINICAL IMPRESSION:  *** Pt will continue to benefit from skilled therapy to address remaining deficits in order to improve overall QoL and return to PLOF.       OBJECTIVE IMPAIRMENTS Abnormal gait, decreased activity tolerance, decreased balance, decreased mobility, difficulty walking, decreased strength, impaired perceived functional ability, and impaired flexibility.   ACTIVITY LIMITATIONS bending, standing, squatting, stairs, transfers, and locomotion level  PARTICIPATION LIMITATIONS: meal prep, cleaning, laundry, medication management, driving, and community activity  PERSONAL FACTORS Age and 3+ comorbidities:  CHF, COPD, HLD, HTN  are also affecting patient's functional outcome.   REHAB POTENTIAL: Fair very deconditioned and has had significant therapy to this point. Also his motivation for HEPcompletion is low.   CLINICAL DECISION MAKING: Evolving/moderate complexity  EVALUATION COMPLEXITY: Moderate  PLAN: PT FREQUENCY: 2x/week  PT DURATION: 12 weeks  PLANNED INTERVENTIONS: Therapeutic exercises, Therapeutic activity, Neuromuscular re-education, Balance training, Gait training, Patient/Family education, Self Care, Joint mobilization, Dry Needling, and Manual therapy  PLAN FOR NEXT SESSION:   Continue to progress standing and walking exercises to address both deconditioning and LE weakness.   MaJanna ArchT Physical Therapist- [facility]11/29/23, 3:31 PM

## 2022-07-22 ENCOUNTER — Ambulatory Visit: Payer: Medicare Other

## 2022-08-03 ENCOUNTER — Ambulatory Visit: Payer: Medicare Other | Attending: Internal Medicine

## 2022-08-03 DIAGNOSIS — R262 Difficulty in walking, not elsewhere classified: Secondary | ICD-10-CM | POA: Diagnosis present

## 2022-08-03 DIAGNOSIS — R2681 Unsteadiness on feet: Secondary | ICD-10-CM | POA: Insufficient documentation

## 2022-08-03 DIAGNOSIS — M6281 Muscle weakness (generalized): Secondary | ICD-10-CM | POA: Insufficient documentation

## 2022-08-03 NOTE — Therapy (Signed)
OUTPATIENT PHYSICAL THERAPY NEURO TREATMENT   Patient Name: Christopher [name] MRN: [id number] DOB:06/16/1951, 71 y.o., male Today's Date: 08/03/2022  PCP: Rusty Aus, MD REFERRING PROVIDER: Rusty Aus, MD  PT End of Session - 08/03/22 1705     Visit Number 23    Number of Visits 42    Date for PT Re-Evaluation 09/09/22    Authorization Type [employer] Medicare    Authorization Time Period 03/22/22-06/14/22    Progress Note Due on Visit 30    PT Start Time 1522    PT Stop Time 1600    PT Time Calculation (min) 38 min    Equipment Utilized During Treatment Gait belt    Activity Tolerance Patient tolerated treatment well;No increased pain    Behavior During Therapy WFL for tasks assessed/performed            Past Medical History:  Diagnosis Date   Anxiety    CHF (congestive heart failure) (HCC)    COPD (chronic obstructive pulmonary disease) ([place])    Depression    Dysrhythmia    Hyperlipidemia    Hypertension    Hypothyroidism    Sleep apnea    Past Surgical History:  Procedure Laterality Date   FRACTURE SURGERY     ORIF ANKLE FRACTURE Left 12/17/2020   Procedure: OPEN REDUCTION INTERNAL FIXATION (ORIF) ANKLE FRACTURE;  Surgeon: Samara Deist, DPM;  Location: [facility] ORS;  Service: Podiatry;  Laterality: Left;   Patient Active Problem List   Diagnosis Date Noted   Generalized weakness 10/16/2021   Chronic respiratory failure with hypoxia (HCC) 10/16/2021   Hypotension    SIRS (systemic inflammatory response syndrome) (HCC)    AF (paroxysmal atrial fibrillation) ([place])    Syncope    Severe sepsis ([place]) 05/20/2021   Pressure injury of skin 04/25/2021   Acute respiratory failure ([place]) 04/21/2021   Acute kidney injury superimposed on CKD ([place]) 04/21/2021   Stage 3b chronic kidney disease (CKD) ([place]) 03/22/2021   Leukocytosis 03/22/2021   Osteomyelitis of ankle or foot, acute, left ([place]) 03/17/2021   Acute on chronic respiratory failure ([place]) 03/17/2021    Osteomyelitis ([place]) 12/16/2020   Depression 12/16/2020   Medicare annual wellness visit, initial 11/14/2019   B12 deficiency 11/14/2019   Hyperlipidemia 10/31/2019   Primary osteoarthritis of right knee 03/16/2019   Sleep apnea 10/31/2018   Traumatic incomplete tear of left rotator cuff 10/02/2018   Morbid obesity with BMI of 45.0-49.9, adult ([place]) 09/20/2018   Hypothyroidism 09/20/2018   Chronic diastolic CHF (congestive heart failure) ([place]) 09/20/2018   Benign essential hypertension 09/20/2018    ONSET DATE: 10/16/20  REFERRING DIAG: R27.0 (ICD-10-CM) - Ataxia, unspecified  THERAPY DIAG:  Difficulty in walking, not elsewhere classified  Muscle weakness (generalized)  Unsteadiness on feet  Rationale for Evaluation and Treatment Rehabilitation  SUBJECTIVE:  SUBJECTIVE STATEMENT: Patient reports they have not been compliant with their HEP. The patient reports they haven't been to therapy for the last 2 weeks due to not having appointments scheduled and some mix ups with scheduling. Patient is experiencing some pain in their (R) knee today from their arthritis, rates pain around a 2/10 on the NPRS.   Pt accompanied by: family member DTR [name])  PERTINENT HISTORY: Pt is s/p ankle fracture February 2022. Patient reports independence with most activities prior to initial injury of his ankle.  [name] to STR at [facility], then OPPT at [facility]. Pt has struggled to return to premorbid status. Patient recently started medication for new depression which began after wife passing away this year.  Depression has been a barrier to compliance with HEP in home. Pt reports progressive deconditioning 2/2 falls anxiety.  PAIN:  Are you having pain? No  PRECAUTIONS: Fall  WEIGHT BEARING  RESTRICTIONS No  LIVING ENVIRONMENT: Lives with: lives with their family Lives in: House/apartment Stairs: Yes: External: 1 steps; none 4 steps in garage with bilateral rails that are difficulty to grip.  Has following equipment at home: Gilford Rile - 2 wheeled, Environmental consultant - 4 wheeled, and shower chair  PLOF: Independent with household mobility with device, Independent with homemaking with ambulation, Independent with gait, and Leisure: making rings out of coins   PATIENT GOALS Improve ability to cook, be able to access farmer's market independently. Be able to go into restaurants.   TODAY'S TREATMENT: 08/03/22  Vitals Prior to Exercise:  O2 saturation: 95%  HR: 93 bpm   *HR and O2 Saturation Monitored Throughout Session*   In // bars:  - Ambulate forwards/backwards 4x length of // bars; heavy BUE support  - Lateral step over half foam roller and back 10x  each side  - Static stand with Saebo 3 trials  *Unable to complete full trial on with saebo, only able to bring balls all the way down or all the way up during first 2* *Patient was able to complete full trial on 3rd attempt*  Seated:  4lb ankle weight:  - march 10x each LE; x2 sets; second set with upright posture - LAQ 10x each LE; x2 sets; hold 3 seconds  - heel raise 20x - Soccer pass using the bottom of their foot, 20 passes back and forth   - 3 way soccer pass with patient and daughter, 1 min - adduction ball squeeze 10x 5 seconds   PATIENT EDUCATION: Education details: Pt educated throughout session about proper posture and technique with exercises. Improved exercise technique, movement at target joints, use of target muscles after min to mod verbal, visual, tactile cues.  Person educated: Patient, DTR Education method: Explanation, Demonstration, and Verbal cues Education comprehension: verbalized understanding, returned demonstration, verbal cues required, and needs further education   HOME EXERCISE PROGRAM: No  updates today, pt to continue HEP as previously given. Educated to set timer every 1-2hrs and complete walking/HEP for improved endurance Access Code: [email or web address] URL: [email or web address] Date: 03/26/2022 Prepared by: Rivka Barbara  Exercises - Seated March  - 1 x daily - 7 x weekly - 2 sets - 10 reps - Seated Long Arc Quad  - 1 x daily - 7 x weekly - 2 sets - 10 reps - Seated single leg step to side   - 1 x daily - 7 x weekly - 2 sets - 10 reps - Sit to Stand with Counter Support  - 1 x daily -  7 x weekly - 1 sets - 5 reps  GOALS Goals reviewed with patient? Yes  SHORT TERM GOALS: Target date: 07/29/2022   Patient will be independent in home exercise program to improve strength/mobility for better functional independence with ADLs. Baseline: No HEP currently 9/13: Pt not compliant with HEP nut doing some of the exercises intermittently  10/26: education on compliance  Goal status: NOT MET  LONG TERM GOALS: Target date: 09/09/2022   1.  Patient (> 63 years old) will complete five times sit to stand test in < 15 seconds without assistance from walker for standing balance indicating an increased LE strength and improved balance. Baseline: 18.77 with significant UE use and with walker for balance 16.96 sec with UE assist on walker 10/26: 20.87 seconds Goal status: IN PROGRESS  2.  Patient will increase FOTO score to equal to or greater than  57   to demonstrate statistically significant improvement in mobility and quality of life.  Baseline: 47 9/13: 67 Goal status: MET    3.  Patient will reduce timed up and go to <11 seconds to reduce fall risk and demonstrate improved transfer/gait ability. Baseline: 76.68 9/13:51.07 sec 10/26: 29.94 seconds with RW  Goal status: IN PROGRESS  4.  Patient will increase 10 meter walk test to >.85ms as to improve gait speed for better community ambulation and to reduce fall risk. Baseline: 8/4: 21.36 sec (.464m) 9/13:22.93 10/26:  18.16 seconds =0.55 m/s  Goal status: IN PROGRESS  5.  Pt will stand independently without UE assist and without LOB for 3 min in order to indicate improved standing endurance and LE strength for cooking and meal prep activities .  Baseline: 50 seconds 9/13: 55 seconds no UE, 1:30 with UE assist 10/26: 58 seconds no UE support Goal status: IN PROGRESS   ASSESSMENT:  CLINICAL IMPRESSION: The patient was educated again on need for attending therapy session on time for optimal progress and on need for compliance of HEP due to regression of strength noted during standing interventions over the last few sessions. The patient verbalized understanding but hasn't demonstrated any change in motivation since verbal agreement last session, will assess again next session. Pt will continue to benefit from skilled therapy to address remaining deficits in order to improve overall QoL and return to PLOF.    OBJECTIVE IMPAIRMENTS Abnormal gait, decreased activity tolerance, decreased balance, decreased mobility, difficulty walking, decreased strength, impaired perceived functional ability, and impaired flexibility.   ACTIVITY LIMITATIONS bending, standing, squatting, stairs, transfers, and locomotion level  PARTICIPATION LIMITATIONS: meal prep, cleaning, laundry, medication management, driving, and community activity  PERSONAL FACTORS Age and 3+ comorbidities: CHF, COPD, HLD, HTN  are also affecting patient's functional outcome.   REHAB POTENTIAL: Fair very deconditioned and has had significant therapy to this point. Also his motivation for HEPcompletion is low.   CLINICAL DECISION MAKING: Evolving/moderate complexity  EVALUATION COMPLEXITY: Moderate  PLAN: PT FREQUENCY: 2x/week  PT DURATION: 12 weeks  PLANNED INTERVENTIONS: Therapeutic exercises, Therapeutic activity, Neuromuscular re-education, Balance training, Gait training, Patient/Family education, Self Care, Joint mobilization, Dry Needling,  and Manual therapy  PLAN FOR NEXT SESSION:  Continue to progress standing and walking exercises to address both deconditioning and LE weakness.   PeSudie BaileyPT   This entire session was performed under direct supervision and direction of a licensed therapist/therapist assistant . I have personally read, edited and approve of the note as written.  MaJanna ArchT Physical Therapist- [facility]  08/03/22, 5:36 PM

## 2022-08-12 ENCOUNTER — Ambulatory Visit: Payer: Medicare Other

## 2022-08-24 ENCOUNTER — Ambulatory Visit: Payer: Medicare Other

## 2022-08-24 IMAGING — DX DG CHEST 1V PORT
1 series · 1 of 1 positions shown · non-contrast
Comparison: Chest radiograph dated 05/22/2021.

CLINICAL DATA: Questionable sepsis.

EXAM:
PORTABLE CHEST 1 VIEW

[chest ap]
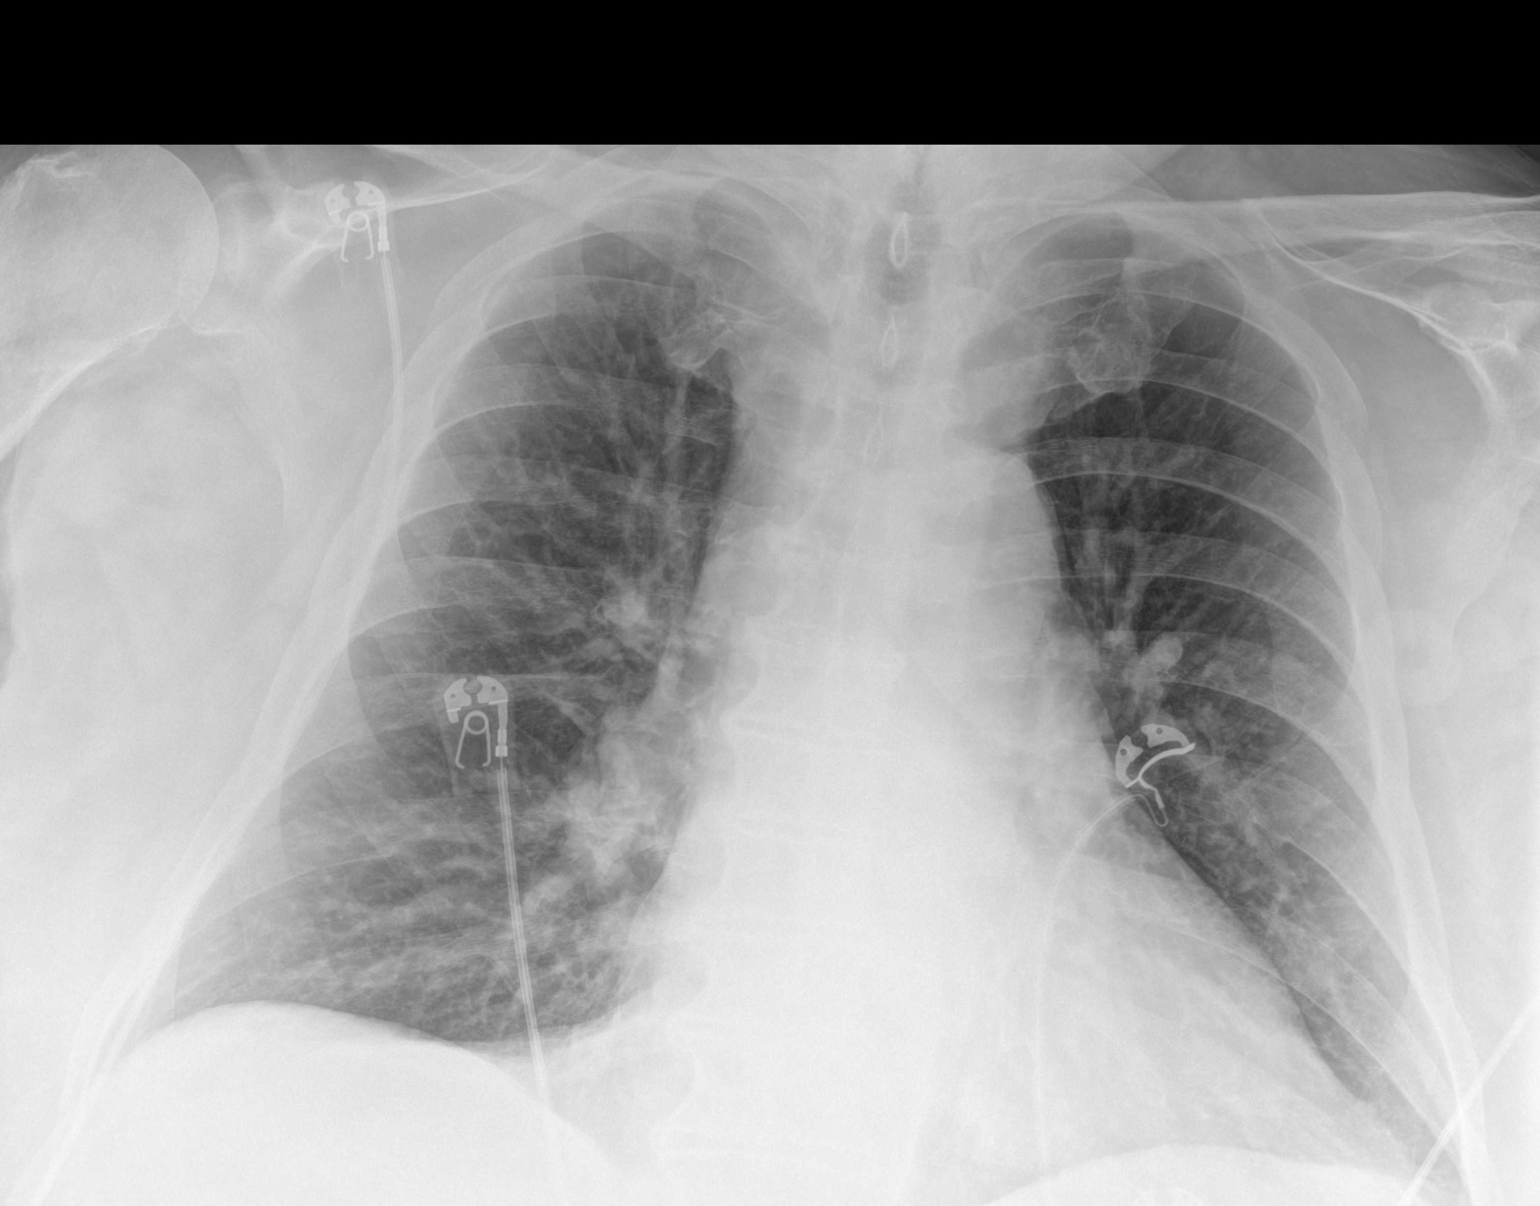

[1 of 1 positions shown; findings below may reference images not displayed]

FINDINGS: No focal consolidation, pleural effusion, pneumothorax. Mild
cardiomegaly with mild vascular congestion. No acute osseous
pathology.
IMPRESSION: Mild cardiomegaly with mild central vascular congestion. No focal
consolidation.

## 2022-08-24 NOTE — Therapy (Incomplete)
OUTPATIENT PHYSICAL THERAPY NEURO TREATMENT   Patient Name: Christopher [name] MRN: [id number] DOB:09-28-1950, 72 y.o., male Today's Date: 08/24/2022  PCP: Rusty Aus, MD REFERRING PROVIDER: Rusty Aus, MD   Past Medical History:  Diagnosis Date   Anxiety    CHF (congestive heart failure) ([place])    COPD (chronic obstructive pulmonary disease) ([place])    Depression    Dysrhythmia    Hyperlipidemia    Hypertension    Hypothyroidism    Sleep apnea    Past Surgical History:  Procedure Laterality Date   FRACTURE SURGERY     ORIF ANKLE FRACTURE Left 12/17/2020   Procedure: OPEN REDUCTION INTERNAL FIXATION (ORIF) ANKLE FRACTURE;  Surgeon: Samara Deist, DPM;  Location: [facility] ORS;  Service: Podiatry;  Laterality: Left;   Patient Active Problem List   Diagnosis Date Noted   Generalized weakness 10/16/2021   Chronic respiratory failure with hypoxia (HCC) 10/16/2021   Hypotension    SIRS (systemic inflammatory response syndrome) (HCC)    AF (paroxysmal atrial fibrillation) ([place])    Syncope    Severe sepsis ([place]) 05/20/2021   Pressure injury of skin 04/25/2021   Acute respiratory failure ([place]) 04/21/2021   Acute kidney injury superimposed on CKD ([place]) 04/21/2021   Stage 3b chronic kidney disease (CKD) ([place]) 03/22/2021   Leukocytosis 03/22/2021   Osteomyelitis of ankle or foot, acute, left ([place]) 03/17/2021   Acute on chronic respiratory failure ([place]) 03/17/2021   Osteomyelitis ([place]) 12/16/2020   Depression 12/16/2020   Medicare annual wellness visit, initial 11/14/2019   B12 deficiency 11/14/2019   Hyperlipidemia 10/31/2019   Primary osteoarthritis of right knee 03/16/2019   Sleep apnea 10/31/2018   Traumatic incomplete tear of left rotator cuff 10/02/2018   Morbid obesity with BMI of 45.0-49.9, adult ([place]) 09/20/2018   Hypothyroidism 09/20/2018   Chronic diastolic CHF (congestive heart failure) ([place]) 09/20/2018   Benign essential hypertension 09/20/2018    ONSET DATE:  10/16/20  REFERRING DIAG: R27.0 (ICD-10-CM) - Ataxia, unspecified  THERAPY DIAG:  No diagnosis found.  Rationale for Evaluation and Treatment Rehabilitation  SUBJECTIVE:                                                                                                                                                                                             SUBJECTIVE STATEMENT: *** Patient reports they have not been compliant with their HEP. The patient reports they haven't been to therapy for the last 2 weeks due to not having appointments scheduled and some mix ups with scheduling. Patient is experiencing some pain in their (R) knee today from their arthritis, rates pain around  a 2/10 on the NPRS.   Pt accompanied by: family member DTR [name])  PERTINENT HISTORY: Pt is s/p ankle fracture February 2022. Patient reports independence with most activities prior to initial injury of his ankle.  [name] to STR at [facility], then OPPT at [facility]. Pt has struggled to return to premorbid status. Patient recently started medication for new depression which began after wife passing away this year.  Depression has been a barrier to compliance with HEP in home. Pt reports progressive deconditioning 2/2 falls anxiety.  PAIN:  Are you having pain? No  PRECAUTIONS: Fall  WEIGHT BEARING RESTRICTIONS No  LIVING ENVIRONMENT: Lives with: lives with their family Lives in: House/apartment Stairs: Yes: External: 1 steps; none 4 steps in garage with bilateral rails that are difficulty to grip.  Has following equipment at home: Gilford Rile - 2 wheeled, Environmental consultant - 4 wheeled, and shower chair  PLOF: Independent with household mobility with device, Independent with homemaking with ambulation, Independent with gait, and Leisure: making rings out of coins   PATIENT GOALS Improve ability to cook, be able to access farmer's market independently. Be able to go into restaurants.   TODAY'S  TREATMENT: 08/24/22 *** Vitals Prior to Exercise:  O2 saturation: 95%  HR: 93 bpm   *HR and O2 Saturation Monitored Throughout Session*   In // bars:  - Ambulate forwards/backwards 4x length of // bars; heavy BUE support  - Lateral step over half foam roller and back 10x  each side  - Static stand with Saebo 3 trials  *Unable to complete full trial on with saebo, only able to bring balls all the way down or all the way up during first 2* *Patient was able to complete full trial on 3rd attempt*  Seated:  4lb ankle weight:  - march 10x each LE; x2 sets; second set with upright posture - LAQ 10x each LE; x2 sets; hold 3 seconds  - heel raise 20x - Soccer pass using the bottom of their foot, 20 passes back and forth   - 3 way soccer pass with patient and daughter, 1 min - adduction ball squeeze 10x 5 seconds   PATIENT EDUCATION: Education details: Pt educated throughout session about proper posture and technique with exercises. Improved exercise technique, movement at target joints, use of target muscles after min to mod verbal, visual, tactile cues.  Person educated: Patient, DTR Education method: Explanation, Demonstration, and Verbal cues Education comprehension: verbalized understanding, returned demonstration, verbal cues required, and needs further education   HOME EXERCISE PROGRAM: No updates today, pt to continue HEP as previously given. Educated to set timer every 1-2hrs and complete walking/HEP for improved endurance Access Code: [email or web address] URL: [email or web address] Date: 03/26/2022 Prepared by: Rivka Barbara  Exercises - Seated March  - 1 x daily - 7 x weekly - 2 sets - 10 reps - Seated Long Arc Quad  - 1 x daily - 7 x weekly - 2 sets - 10 reps - Seated single leg step to side   - 1 x daily - 7 x weekly - 2 sets - 10 reps - Sit to Stand with Counter Support  - 1 x daily - 7 x weekly - 1 sets - 5 reps  GOALS Goals reviewed with patient?  Yes  SHORT TERM GOALS: Target date: 07/29/2022  Patient will be independent in home exercise program to improve strength/mobility for better functional independence with ADLs. Baseline: No HEP currently 9/13: Pt not compliant with HEP nut  doing some of the exercises intermittently  10/26: education on compliance  Goal status: NOT MET  LONG TERM GOALS: Target date: 09/09/2022  1.  Patient (> 24 years old) will complete five times sit to stand test in < 15 seconds without assistance from walker for standing balance indicating an increased LE strength and improved balance. Baseline: 18.77 with significant UE use and with walker for balance 16.96 sec with UE assist on walker 10/26: 20.87 seconds Goal status: IN PROGRESS  2.  Patient will increase FOTO score to equal to or greater than  57   to demonstrate statistically significant improvement in mobility and quality of life.  Baseline: 47 9/13: 67 Goal status: MET    3.  Patient will reduce timed up and go to <11 seconds to reduce fall risk and demonstrate improved transfer/gait ability. Baseline: 76.68 9/13:51.07 sec 10/26: 29.94 seconds with RW  Goal status: IN PROGRESS  4.  Patient will increase 10 meter walk test to >.86ms as to improve gait speed for better community ambulation and to reduce fall risk. Baseline: 8/4: 21.36 sec (.430m) 9/13:22.93 10/26: 18.16 seconds =0.55 m/s  Goal status: IN PROGRESS  5.  Pt will stand independently without UE assist and without LOB for 3 min in order to indicate improved standing endurance and LE strength for cooking and meal prep activities .  Baseline: 50 seconds 9/13: 55 seconds no UE, 1:30 with UE assist 10/26: 58 seconds no UE support Goal status: IN PROGRESS   ASSESSMENT:  CLINICAL IMPRESSION: *** The patient was educated again on need for attending therapy session on time for optimal progress and on need for compliance of HEP due to regression of strength noted during standing  interventions over the last few sessions. The patient verbalized understanding but hasn't demonstrated any change in motivation since verbal agreement last session, will assess again next session. Pt will continue to benefit from skilled therapy to address remaining deficits in order to improve overall QoL and return to PLOF.    OBJECTIVE IMPAIRMENTS Abnormal gait, decreased activity tolerance, decreased balance, decreased mobility, difficulty walking, decreased strength, impaired perceived functional ability, and impaired flexibility.   ACTIVITY LIMITATIONS bending, standing, squatting, stairs, transfers, and locomotion level  PARTICIPATION LIMITATIONS: meal prep, cleaning, laundry, medication management, driving, and community activity  PERSONAL FACTORS Age and 3+ comorbidities: CHF, COPD, HLD, HTN  are also affecting patient's functional outcome.   REHAB POTENTIAL: Fair very deconditioned and has had significant therapy to this point. Also his motivation for HEPcompletion is low.   CLINICAL DECISION MAKING: Evolving/moderate complexity  EVALUATION COMPLEXITY: Moderate  PLAN: PT FREQUENCY: 2x/week  PT DURATION: 12 weeks  PLANNED INTERVENTIONS: Therapeutic exercises, Therapeutic activity, Neuromuscular re-education, Balance training, Gait training, Patient/Family education, Self Care, Joint mobilization, Dry Needling, and Manual therapy  PLAN FOR NEXT SESSION:  Continue to progress standing and walking exercises to address both deconditioning and LE weakness.   JoGwenlyn SaranPT, DPT Physical Therapist- [facility]01/02/24, 12:12 AM

## 2022-08-25 NOTE — Therapy (Incomplete)
OUTPATIENT PHYSICAL THERAPY NEURO TREATMENT   Patient Name: Christopher [name] MRN: [id number] DOB:April 17, 1951, 72 y.o., male Today's Date: 08/25/2022  PCP: Rusty Aus, MD REFERRING PROVIDER: Rusty Aus, MD   Past Medical History:  Diagnosis Date   Anxiety    CHF (congestive heart failure) ([place])    COPD (chronic obstructive pulmonary disease) ([place])    Depression    Dysrhythmia    Hyperlipidemia    Hypertension    Hypothyroidism    Sleep apnea    Past Surgical History:  Procedure Laterality Date   FRACTURE SURGERY     ORIF ANKLE FRACTURE Left 12/17/2020   Procedure: OPEN REDUCTION INTERNAL FIXATION (ORIF) ANKLE FRACTURE;  Surgeon: Samara Deist, DPM;  Location: [facility] ORS;  Service: Podiatry;  Laterality: Left;   Patient Active Problem List   Diagnosis Date Noted   Generalized weakness 10/16/2021   Chronic respiratory failure with hypoxia (HCC) 10/16/2021   Hypotension    SIRS (systemic inflammatory response syndrome) (HCC)    AF (paroxysmal atrial fibrillation) ([place])    Syncope    Severe sepsis ([place]) 05/20/2021   Pressure injury of skin 04/25/2021   Acute respiratory failure ([place]) 04/21/2021   Acute kidney injury superimposed on CKD ([place]) 04/21/2021   Stage 3b chronic kidney disease (CKD) ([place]) 03/22/2021   Leukocytosis 03/22/2021   Osteomyelitis of ankle or foot, acute, left ([place]) 03/17/2021   Acute on chronic respiratory failure ([place]) 03/17/2021   Osteomyelitis ([place]) 12/16/2020   Depression 12/16/2020   Medicare annual wellness visit, initial 11/14/2019   B12 deficiency 11/14/2019   Hyperlipidemia 10/31/2019   Primary osteoarthritis of right knee 03/16/2019   Sleep apnea 10/31/2018   Traumatic incomplete tear of left rotator cuff 10/02/2018   Morbid obesity with BMI of 45.0-49.9, adult ([place]) 09/20/2018   Hypothyroidism 09/20/2018   Chronic diastolic CHF (congestive heart failure) ([place]) 09/20/2018   Benign essential hypertension 09/20/2018    ONSET DATE:  10/16/20  REFERRING DIAG: R27.0 (ICD-10-CM) - Ataxia, unspecified  THERAPY DIAG:  No diagnosis found.  Rationale for Evaluation and Treatment Rehabilitation  SUBJECTIVE:                                                                                                                                                                                             SUBJECTIVE STATEMENT: *** Patient reports they have not been compliant with their HEP. The patient reports they haven't been to therapy for the last 2 weeks due to not having appointments scheduled and some mix ups with scheduling. Patient is experiencing some pain in their (R) knee today from their arthritis, rates pain around  a 2/10 on the NPRS.   Pt accompanied by: family member DTR [name])  PERTINENT HISTORY: Pt is s/p ankle fracture February 2022. Patient reports independence with most activities prior to initial injury of his ankle.  [name] to STR at [facility], then OPPT at [facility]. Pt has struggled to return to premorbid status. Patient recently started medication for new depression which began after wife passing away this year.  Depression has been a barrier to compliance with HEP in home. Pt reports progressive deconditioning 2/2 falls anxiety.  PAIN:  Are you having pain? No  PRECAUTIONS: Fall  WEIGHT BEARING RESTRICTIONS No  LIVING ENVIRONMENT: Lives with: lives with their family Lives in: House/apartment Stairs: Yes: External: 1 steps; none 4 steps in garage with bilateral rails that are difficulty to grip.  Has following equipment at home: Gilford Rile - 2 wheeled, Environmental consultant - 4 wheeled, and shower chair  PLOF: Independent with household mobility with device, Independent with homemaking with ambulation, Independent with gait, and Leisure: making rings out of coins   PATIENT GOALS Improve ability to cook, be able to access farmer's market independently. Be able to go into restaurants.   TODAY'S  TREATMENT: 08/25/22 *** Vitals Prior to Exercise:  O2 saturation: 95%  HR: 93 bpm   *HR and O2 Saturation Monitored Throughout Session*   In // bars:  - Ambulate forwards/backwards 4x length of // bars; heavy BUE support  - Lateral step over half foam roller and back 10x  each side  - Static stand with Saebo 3 trials  *Unable to complete full trial on with saebo, only able to bring balls all the way down or all the way up during first 2* *Patient was able to complete full trial on 3rd attempt*  Seated:  4lb ankle weight:  - march 10x each LE; x2 sets; second set with upright posture - LAQ 10x each LE; x2 sets; hold 3 seconds  - heel raise 20x - Soccer pass using the bottom of their foot, 20 passes back and forth   - 3 way soccer pass with patient and daughter, 1 min - adduction ball squeeze 10x 5 seconds   PATIENT EDUCATION: Education details: Pt educated throughout session about proper posture and technique with exercises. Improved exercise technique, movement at target joints, use of target muscles after min to mod verbal, visual, tactile cues.  Person educated: Patient, DTR Education method: Explanation, Demonstration, and Verbal cues Education comprehension: verbalized understanding, returned demonstration, verbal cues required, and needs further education   HOME EXERCISE PROGRAM: No updates today, pt to continue HEP as previously given. Educated to set timer every 1-2hrs and complete walking/HEP for improved endurance Access Code: [email or web address] URL: [email or web address] Date: 03/26/2022 Prepared by: Rivka Barbara  Exercises - Seated March  - 1 x daily - 7 x weekly - 2 sets - 10 reps - Seated Long Arc Quad  - 1 x daily - 7 x weekly - 2 sets - 10 reps - Seated single leg step to side   - 1 x daily - 7 x weekly - 2 sets - 10 reps - Sit to Stand with Counter Support  - 1 x daily - 7 x weekly - 1 sets - 5 reps  GOALS Goals reviewed with patient?  Yes  SHORT TERM GOALS: Target date: 07/29/2022  Patient will be independent in home exercise program to improve strength/mobility for better functional independence with ADLs. Baseline: No HEP currently 9/13: Pt not compliant with HEP nut  doing some of the exercises intermittently  10/26: education on compliance  Goal status: NOT MET  LONG TERM GOALS: Target date: 09/09/2022  1.  Patient (> 60 years old) will complete five times sit to stand test in < 15 seconds without assistance from walker for standing balance indicating an increased LE strength and improved balance. Baseline: 18.77 with significant UE use and with walker for balance 16.96 sec with UE assist on walker 10/26: 20.87 seconds Goal status: IN PROGRESS  2.  Patient will increase FOTO score to equal to or greater than  57   to demonstrate statistically significant improvement in mobility and quality of life.  Baseline: 47 9/13: 67 Goal status: MET    3.  Patient will reduce timed up and go to <11 seconds to reduce fall risk and demonstrate improved transfer/gait ability. Baseline: 76.68 9/13:51.07 sec 10/26: 29.94 seconds with RW  Goal status: IN PROGRESS  4.  Patient will increase 10 meter walk test to >.86ms as to improve gait speed for better community ambulation and to reduce fall risk. Baseline: 8/4: 21.36 sec (.445m) 9/13:22.93 10/26: 18.16 seconds =0.55 m/s  Goal status: IN PROGRESS  5.  Pt will stand independently without UE assist and without LOB for 3 min in order to indicate improved standing endurance and LE strength for cooking and meal prep activities .  Baseline: 50 seconds 9/13: 55 seconds no UE, 1:30 with UE assist 10/26: 58 seconds no UE support Goal status: IN PROGRESS   ASSESSMENT:  CLINICAL IMPRESSION: *** The patient was educated again on need for attending therapy session on time for optimal progress and on need for compliance of HEP due to regression of strength noted during standing  interventions over the last few sessions. The patient verbalized understanding but hasn't demonstrated any change in motivation since verbal agreement last session, will assess again next session. Pt will continue to benefit from skilled therapy to address remaining deficits in order to improve overall QoL and return to PLOF.    OBJECTIVE IMPAIRMENTS Abnormal gait, decreased activity tolerance, decreased balance, decreased mobility, difficulty walking, decreased strength, impaired perceived functional ability, and impaired flexibility.   ACTIVITY LIMITATIONS bending, standing, squatting, stairs, transfers, and locomotion level  PARTICIPATION LIMITATIONS: meal prep, cleaning, laundry, medication management, driving, and community activity  PERSONAL FACTORS Age and 3+ comorbidities: CHF, COPD, HLD, HTN  are also affecting patient's functional outcome.   REHAB POTENTIAL: Fair very deconditioned and has had significant therapy to this point. Also his motivation for HEPcompletion is low.   CLINICAL DECISION MAKING: Evolving/moderate complexity  EVALUATION COMPLEXITY: Moderate  PLAN: PT FREQUENCY: 2x/week  PT DURATION: 12 weeks  PLANNED INTERVENTIONS: Therapeutic exercises, Therapeutic activity, Neuromuscular re-education, Balance training, Gait training, Patient/Family education, Self Care, Joint mobilization, Dry Needling, and Manual therapy  PLAN FOR NEXT SESSION:  *** Continue to progress standing and walking exercises to address both deconditioning and LE weakness.   JoGwenlyn SaranPT, DPT Physical Therapist- [facility]01/03/24, 5:24 PM

## 2022-08-26 ENCOUNTER — Ambulatory Visit: Payer: Medicare Other

## 2022-08-26 IMAGING — CR DG CHEST 2V
1 series · 3 of 3 positions shown · non-contrast
Comparison: 10/15/2021

CLINICAL DATA: Chronic diastolic heart failure. Chronic hypoxic
hypercapnic respiratory failure.

EXAM:
CHEST - 2 VIEW

[Series 1: dg chest 2 view · 0.14mm/px · 3 of 3 slices shown]
[im 1/3]
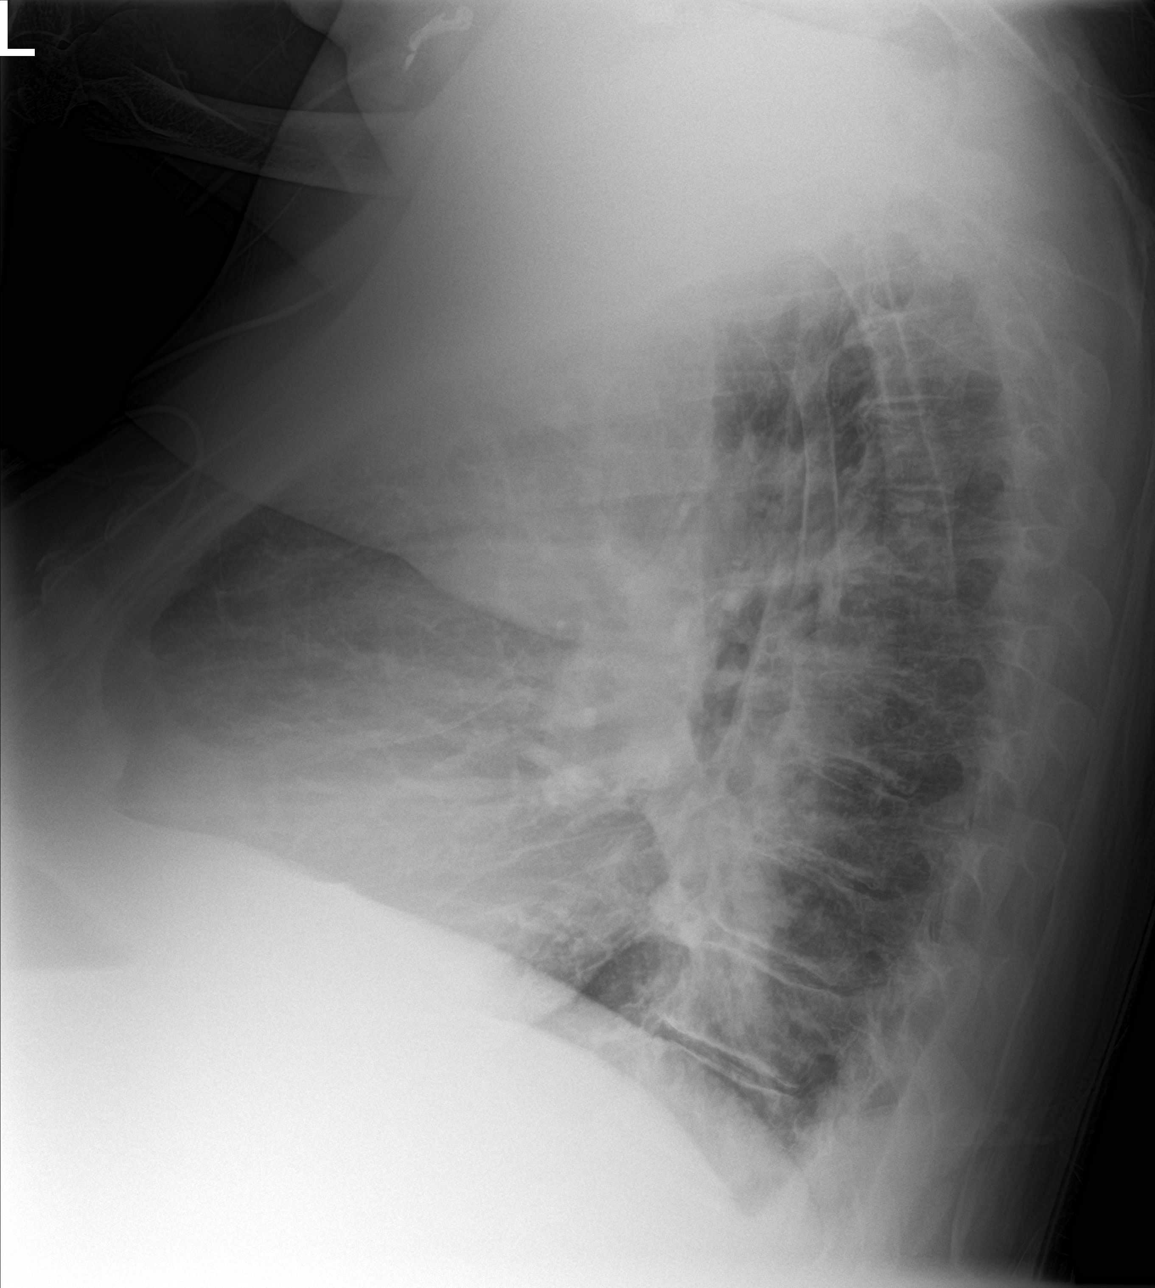
[im 2/3]
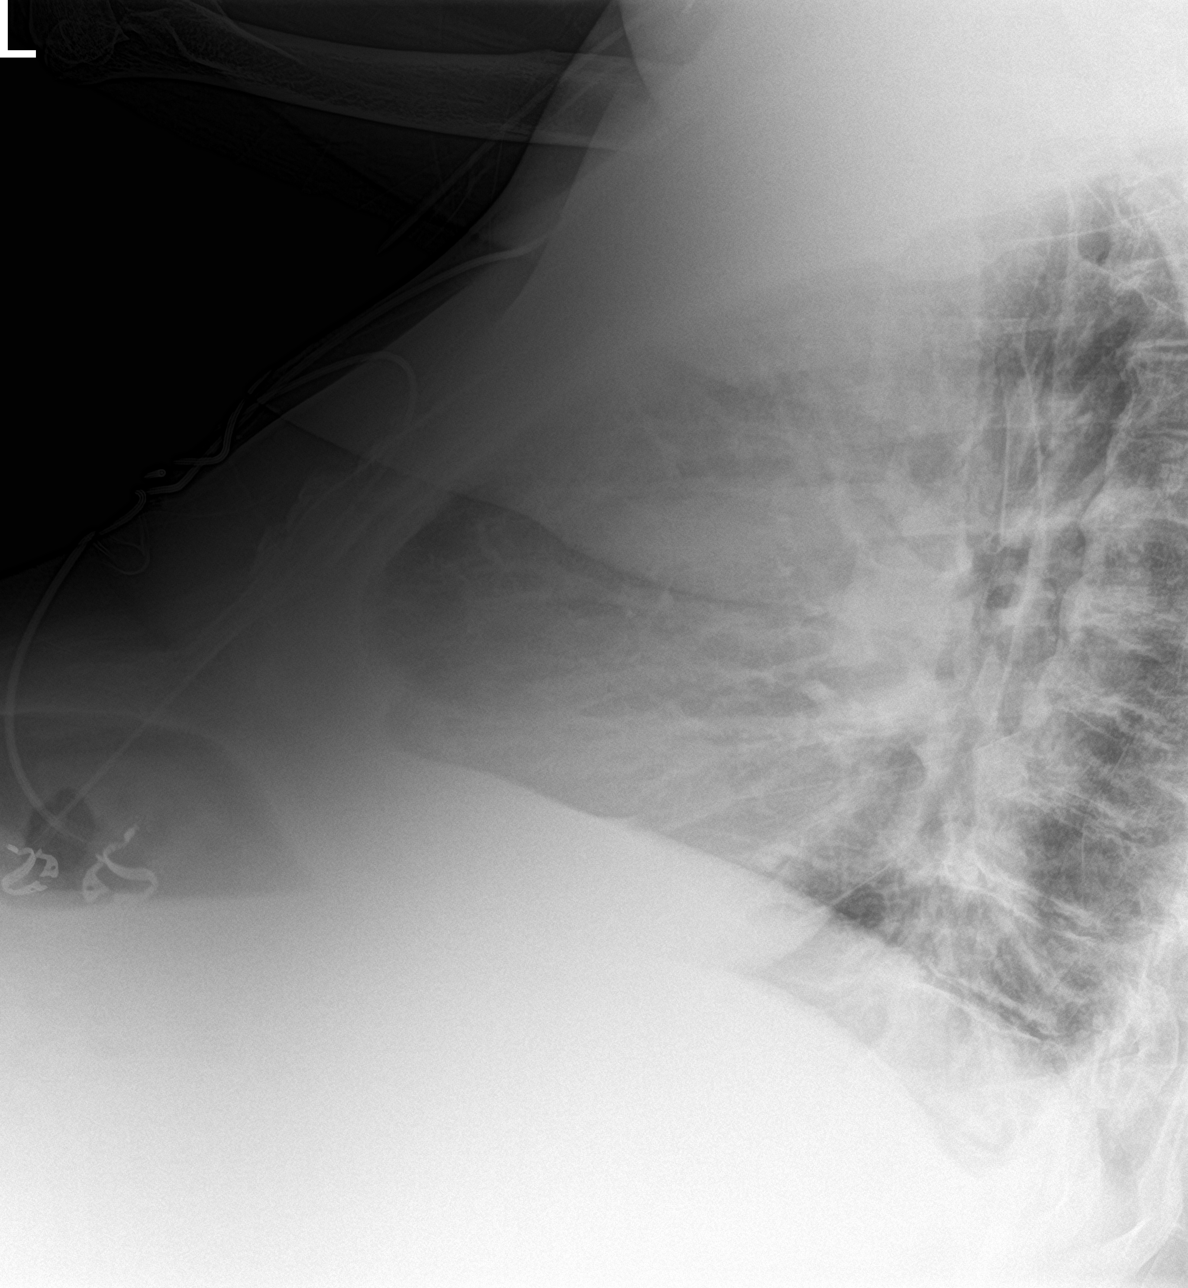
[im 3/3]
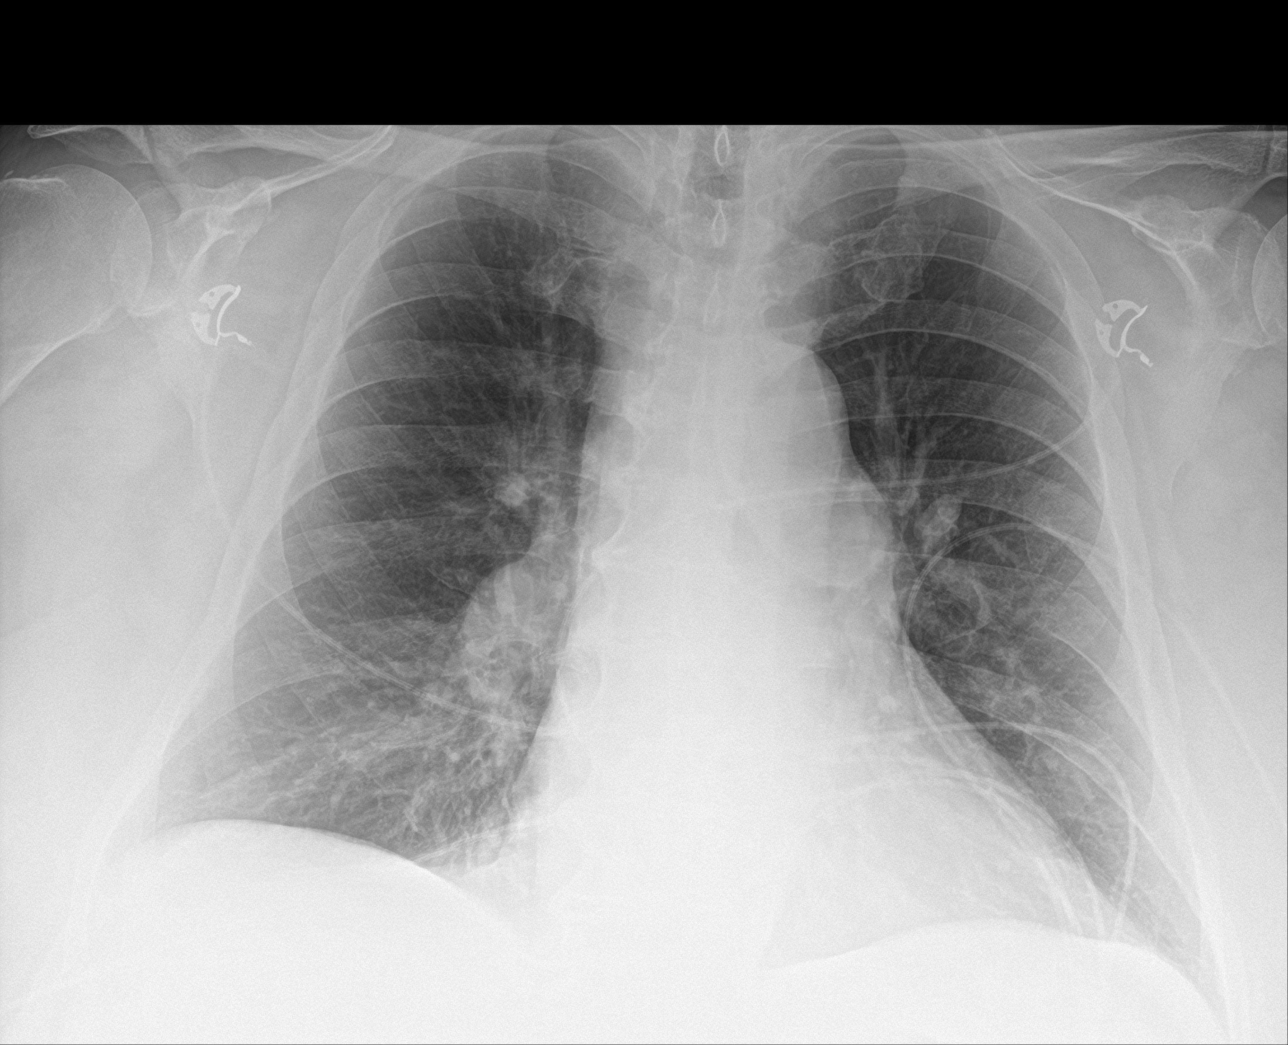

[3 of 3 positions shown; findings below may reference images not displayed]

FINDINGS: Mild cardiomegaly and pulmonary vascular congestion appears stable.
No evidence of acute infiltrate or edema. No evidence of pleural
effusion.
IMPRESSION: Stable cardiomegaly and pulmonary vascular congestion. No acute
findings.

## 2022-08-31 ENCOUNTER — Ambulatory Visit: Payer: Medicare Other

## 2022-09-02 ENCOUNTER — Ambulatory Visit: Payer: Medicare Other | Attending: Internal Medicine

## 2022-09-03 ENCOUNTER — Telehealth: Payer: Self-pay

## 2022-09-03 NOTE — Telephone Encounter (Signed)
I called and spoke with Christopher [name]. He said he has had covid and it has been a slow hard recovery. He plans to be here Tuesday next week and says he will call if he cant make it.

## 2022-09-07 ENCOUNTER — Ambulatory Visit: Payer: Medicare Other

## 2022-09-09 ENCOUNTER — Ambulatory Visit: Payer: Medicare Other

## 2022-09-14 ENCOUNTER — Ambulatory Visit: Payer: Medicare Other

## 2022-09-16 ENCOUNTER — Ambulatory Visit: Payer: Medicare Other

## 2022-09-21 ENCOUNTER — Ambulatory Visit: Payer: Medicare Other

## 2022-09-23 ENCOUNTER — Ambulatory Visit: Payer: Medicare Other

## 2022-10-14 ENCOUNTER — Encounter: Payer: Medicare Other | Attending: Pulmonary Disease | Admitting: *Deleted

## 2022-10-14 DIAGNOSIS — R0689 Other abnormalities of breathing: Secondary | ICD-10-CM | POA: Insufficient documentation

## 2022-10-14 DIAGNOSIS — R0609 Other forms of dyspnea: Secondary | ICD-10-CM

## 2022-10-14 DIAGNOSIS — R0600 Dyspnea, unspecified: Secondary | ICD-10-CM | POA: Insufficient documentation

## 2022-10-14 DIAGNOSIS — Z5189 Encounter for other specified aftercare: Secondary | ICD-10-CM | POA: Insufficient documentation

## 2022-10-14 NOTE — Progress Notes (Signed)
Initial phone call completed. Diagnosis can be found in [facility] 2/15. EP Orientation scheduled for Wednesday 2/28 at 2:30.

## 2022-10-20 VITALS — Ht 70.0 in | Wt 309.9 lb

## 2022-10-20 DIAGNOSIS — R0689 Other abnormalities of breathing: Secondary | ICD-10-CM | POA: Diagnosis not present

## 2022-10-20 DIAGNOSIS — R0609 Other forms of dyspnea: Secondary | ICD-10-CM

## 2022-10-20 DIAGNOSIS — Z5189 Encounter for other specified aftercare: Secondary | ICD-10-CM | POA: Diagnosis present

## 2022-10-20 DIAGNOSIS — R0600 Dyspnea, unspecified: Secondary | ICD-10-CM | POA: Diagnosis not present

## 2022-10-20 NOTE — Patient Instructions (Signed)
Patient Instructions  Patient Details  Name: Christopher [name] MRN: [id number] Date of Birth: 10-Oct-1950 Referring Provider:  Ottie Glazier, MD  Below are your personal goals for exercise, nutrition, and risk factors. Our goal is to help you stay on track towards obtaining and maintaining these goals. We will be discussing your progress on these goals with you throughout the program.  Initial Exercise Prescription:  Initial Exercise Prescription - 10/20/22 1600       Date of Initial Exercise RX and Referring Provider   Date 10/20/22    Referring Provider Ottie Glazier MD      Oxygen   Oxygen Continuous    Liters 2L prn    Maintain Oxygen Saturation 88% or higher      NuStep   Level 1    SPM 80   start with 60   Minutes 15    METs 1      T5 Nustep   Level 1    SPM 80   start with 60   Minutes 15    METs 1      Biostep-RELP   Level 1    SPM 50   start with 30   Minutes 15    METs 1      Track   Laps 3   breaks as needed   Minutes 15    METs 1      Prescription Details   Frequency (times per week) 2    Duration Progress to 30 minutes of continuous aerobic without signs/symptoms of physical distress      Intensity   THRR 40-80% of Max Heartrate 112- 136    Ratings of Perceived Exertion 11-13    Perceived Dyspnea 0-4      Progression   Progression Continue to progress workloads to maintain intensity without signs/symptoms of physical distress.      Resistance Training   Training Prescription Yes    Weight 3 lb    Reps 10-15             Exercise Goals: Frequency: Be able to perform aerobic exercise two to three times per week in program working toward 2-5 days per week of home exercise.  Intensity: Work with a perceived exertion of 11 (fairly light) - 15 (hard) while following your exercise prescription.  We will make changes to your prescription with you as you progress through the program.   Duration: Be able to do 30 to 45 minutes of continuous  aerobic exercise in addition to a 5 minute warm-up and a 5 minute cool-down routine.   Nutrition Goals: Your personal nutrition goals will be established when you do your nutrition analysis with the dietician.  The following are general nutrition guidelines to follow: Cholesterol < '200mg'$ /day Sodium < '1500mg'$ /day Fiber: Men over 50 yrs - 30 grams per day  Personal Goals:  Personal Goals and Risk Factors at Admission - 10/20/22 1659       Core Components/Risk Factors/Patient Goals on Admission    Weight Management Yes;Obesity;Weight Loss    Intervention Weight Management: Develop a combined nutrition and exercise program designed to reach desired caloric intake, while maintaining appropriate intake of nutrient and fiber, sodium and fats, and appropriate energy expenditure required for the weight goal.;Weight Management: Provide education and appropriate resources to help participant work on and attain dietary goals.;Weight Management/Obesity: Establish reasonable short term and long term weight goals.;Obesity: Provide education and appropriate resources to help participant work on and attain dietary goals.  Admit Weight 309 lb (140.2 kg)    Goal Weight: Short Term 303 lb (137.4 kg)    Goal Weight: Long Term 250 lb (113.4 kg)    Expected Outcomes Short Term: Continue to assess and modify interventions until short term weight is achieved;Long Term: Adherence to nutrition and physical activity/exercise program aimed toward attainment of established weight goal;Weight Loss: Understanding of general recommendations for a balanced deficit meal plan, which promotes 1-2 lb weight loss per week and includes a negative energy balance of [phone] kcal/d;Understanding recommendations for meals to include 15-35% energy as protein, 25-35% energy from fat, 35-60% energy from carbohydrates, less than '200mg'$  of dietary cholesterol, 20-35 gm of total fiber daily;Understanding of distribution of calorie intake  throughout the day with the consumption of 4-5 meals/snacks    Improve shortness of breath with ADL's Yes    Intervention Provide education, individualized exercise plan and daily activity instruction to help decrease symptoms of SOB with activities of daily living.    Expected Outcomes Short Term: Improve cardiorespiratory fitness to achieve a reduction of symptoms when performing ADLs;Long Term: Be able to perform more ADLs without symptoms or delay the onset of symptoms    Hypertension Yes   Hx of hypotension   Intervention Provide education on lifestyle modifcations including regular physical activity/exercise, weight management, moderate sodium restriction and increased consumption of fresh fruit, vegetables, and low fat dairy, alcohol moderation, and smoking cessation.;Monitor prescription use compliance.    Expected Outcomes Short Term: Continued assessment and intervention until BP is < 140/18m HG in hypertensive participants. < 130/835mHG in hypertensive participants with diabetes, heart failure or chronic kidney disease.;Long Term: Maintenance of blood pressure at goal levels.    Lipids Yes    Intervention Provide education and support for participant on nutrition & aerobic/resistive exercise along with prescribed medications to achieve LDL '70mg'$ , HDL >'40mg'$ .    Expected Outcomes Short Term: Participant states understanding of desired cholesterol values and is compliant with medications prescribed. Participant is following exercise prescription and nutrition guidelines.;Long Term: Cholesterol controlled with medications as prescribed, with individualized exercise RX and with personalized nutrition plan. Value goals: LDL < '70mg'$ , HDL > 40 mg.             Tobacco Use Initial Evaluation: Social History   Tobacco Use  Smoking Status Never  Smokeless Tobacco Never    Exercise Goals and Review:  Exercise Goals     Row Name 10/20/22 1659             Exercise Goals   Increase  Physical Activity Yes       Intervention Provide advice, education, support and counseling about physical activity/exercise needs.;Develop an individualized exercise prescription for aerobic and resistive training based on initial evaluation findings, risk stratification, comorbidities and participant's personal goals.       Expected Outcomes Short Term: Attend rehab on a regular basis to increase amount of physical activity.;Long Term: Add in home exercise to make exercise part of routine and to increase amount of physical activity.;Long Term: Exercising regularly at least 3-5 days a week.       Increase Strength and Stamina Yes       Intervention Provide advice, education, support and counseling about physical activity/exercise needs.;Develop an individualized exercise prescription for aerobic and resistive training based on initial evaluation findings, risk stratification, comorbidities and participant's personal goals.       Expected Outcomes Short Term: Increase workloads from initial exercise prescription for resistance, speed, and METs.;Short Term:  Perform resistance training exercises routinely during rehab and add in resistance training at home;Long Term: Improve cardiorespiratory fitness, muscular endurance and strength as measured by increased METs and functional capacity (6MWT)       Able to understand and use rate of perceived exertion (RPE) scale Yes       Intervention Provide education and explanation on how to use RPE scale       Expected Outcomes Short Term: Able to use RPE daily in rehab to express subjective intensity level;Long Term:  Able to use RPE to guide intensity level when exercising independently       Able to understand and use Dyspnea scale Yes       Intervention Provide education and explanation on how to use Dyspnea scale       Expected Outcomes Short Term: Able to use Dyspnea scale daily in rehab to express subjective sense of shortness of breath during exertion;Long Term:  Able to use Dyspnea scale to guide intensity level when exercising independently       Knowledge and understanding of Target Heart Rate Range (THRR) Yes       Intervention Provide education and explanation of THRR including how the numbers were predicted and where they are located for reference       Expected Outcomes Long Term: Able to use THRR to govern intensity when exercising independently;Short Term: Able to use daily as guideline for intensity in rehab;Short Term: Able to state/look up THRR       Able to check pulse independently Yes       Intervention Provide education and demonstration on how to check pulse in carotid and radial arteries.;Review the importance of being able to check your own pulse for safety during independent exercise       Expected Outcomes Long Term: Able to check pulse independently and accurately;Short Term: Able to explain why pulse checking is important during independent exercise       Understanding of Exercise Prescription Yes       Intervention Provide education, explanation, and written materials on patient's individual exercise prescription       Expected Outcomes Short Term: Able to explain program exercise prescription;Long Term: Able to explain home exercise prescription to exercise independently                Copy of goals given to participant.

## 2022-10-20 NOTE — Progress Notes (Signed)
Pulmonary Individual Treatment Plan  Patient Details  Name: Christopher [name] MRN: [id number] Date of Birth: September 03, 1950 Referring Provider:   Flowsheet Row Pulmonary Rehab from 10/20/2022 in [facility] Cardiac and Pulmonary Rehab  Referring Provider Ottie Glazier MD       Initial Encounter Date:  Flowsheet Row Pulmonary Rehab from 10/20/2022 in [facility] Cardiac and Pulmonary Rehab  Date 10/20/22       Visit Diagnosis: Dyspnea on exertion  Patient's Home Medications on Admission:  Current Outpatient Medications:    acetaminophen (TYLENOL) 325 MG tablet, Take 2 tablets (650 mg total) by mouth every 6 (six) hours as needed for mild pain, fever, headache or moderate pain. (Patient not taking: Reported on 10/14/2022), Disp: , Rfl:    aspirin 81 MG EC tablet, Take 81 mg by mouth at bedtime., Disp: , Rfl:    atorvastatin (LIPITOR) 20 MG tablet, Take 20 mg by mouth at bedtime., Disp: , Rfl:    buPROPion (WELLBUTRIN SR) 150 MG 12 hr tablet, Take 150 mg by mouth 2 (two) times daily. After 1st meal of day and after dinner, Disp: , Rfl:    Cholecalciferol 25 MCG (1000 UT) capsule, Take 1,000 Units by mouth at bedtime., Disp: , Rfl:    cyanocobalamin 1000 MCG tablet, Take 1,000 mcg by mouth 2 (two) times a week. At bedtime. Sundays and Wednesdays, Disp: , Rfl:    FLOVENT HFA 110 MCG/ACT inhaler, Inhale 1 puff into the lungs 2 (two) times daily., Disp: , Rfl:    hydrocortisone cream 1 %, Apply topically as needed for itching. (Patient not taking: Reported on 10/14/2022), Disp: 30 g, Rfl: 0   ipratropium (ATROVENT) 0.06 % nasal spray, Place 2 sprays into both nostrils 3 (three) times daily as needed for rhinitis., Disp: , Rfl:    levalbuterol (XOPENEX HFA) 45 MCG/ACT inhaler, Inhale 2 puffs into the lungs every 4 (four) hours as needed for wheezing., Disp: , Rfl:    levalbuterol (XOPENEX) 0.63 MG/3ML nebulizer solution, Take 3 mLs (0.63 mg total) by nebulization every 6 (six) hours as needed for wheezing or  shortness of breath., Disp: 3 mL, Rfl: 12   levothyroxine (SYNTHROID) 88 MCG tablet, Take 88 mcg by mouth daily. 30 to 60 minutes before breakfast on an empty stomach and with a glass of water, Disp: , Rfl:    loratadine (CLARITIN) 10 MG tablet, Take 10 mg by mouth daily., Disp: , Rfl:    Magnesium 250 MG TABS, Take 1 tablet by mouth daily., Disp: , Rfl:    midodrine (PROAMATINE) 10 MG tablet, Take 1 tablet (10 mg total) by mouth 3 (three) times daily with meals., Disp: 90 tablet, Rfl: 0   Multiple Vitamins-Minerals (PRESERVISION AREDS 2) CAPS, Take 1 capsule by mouth 2 (two) times daily. With food. After 1st meal of day and after dinner, Disp: , Rfl:    OXYGEN, Inhale 2 L into the lungs continuous., Disp: , Rfl:    potassium chloride SA (KLOR-CON) 20 MEQ tablet, Take 1 tablet (20 mEq total) by mouth daily., Disp: 30 tablet, Rfl: 0   sertraline (ZOLOFT) 50 MG tablet, Take 50 mg by mouth daily., Disp: , Rfl:    torsemide (DEMADEX) 20 MG tablet, Take 1 tablet (20 mg total) by mouth daily., Disp: , Rfl:    traMADol (ULTRAM) 50 MG tablet, Take 50 mg by mouth every 12 (twelve) hours as needed for moderate pain., Disp: , Rfl:    trolamine salicylate (ASPERCREME) 10 % cream, Apply 1 application topically  as needed for muscle pain. Apply to knees and feet, Disp: , Rfl:    Vitamins A & D (VITAMIN A & D) ointment, Apply 1 application topically as needed. To groin area., Disp: , Rfl:   Past Medical History: Past Medical History:  Diagnosis Date   Anxiety    CHF (congestive heart failure) (HCC)    COPD (chronic obstructive pulmonary disease) ([place])    Depression    Dysrhythmia    Hyperlipidemia    Hypertension    Hypothyroidism    Sleep apnea     Tobacco Use: Social History   Tobacco Use  Smoking Status Never  Smokeless Tobacco Never    Labs: Review Flowsheet  More data exists      Latest Ref Rng & Units 03/22/2021 03/23/2021 04/20/2021 04/21/2021 05/20/2021  Labs for ITP Cardiac and Pulmonary  Rehab  Cholestrol 0 - 200 mg/dL - 88  - - -  LDL (calc) 0 - 99 mg/dL - 46  - - -  HDL-C >40 mg/dL - 26  - - -  Trlycerides <150 mg/dL - 78  - - -  PH, Arterial 7.350 - 7.450 - - 7.33  7.32  7.33  7.50   PCO2 arterial 32.0 - 48.0 mmHg - - 80  82  80  42   Bicarbonate 20.0 - 28.0 mmol/L 49.1  - 42.2  42.2  42.2  32.5  36.0   O2 Saturation % 58.7  - 93.6  95.8  94.3  98.5  45.7      Pulmonary Assessment Scores:  Pulmonary Assessment Scores     Row Name 10/20/22 1635         ADL UCSD   ADL Phase Entry     SOB Score total 52     Rest 0     Walk 2     Stairs 4     Bath 3     Dress 1     Shop 3       CAT Score   CAT Score 18       mMRC Score   mMRC Score 2              UCSD: Self-administered rating of dyspnea associated with activities of daily living (ADLs) 6-point scale (0 = "not at all" to 5 = "maximal or unable to do because of breathlessness")  Scoring Scores range from 0 to 120.  Minimally important difference is 5 units  CAT: CAT can identify the health impairment of COPD patients and is better correlated with disease progression.  CAT has a scoring range of zero to 40. The CAT score is classified into four groups of low (less than 10), medium (10 - 20), high (21-30) and very high (31-40) based on the impact level of disease on health status. A CAT score over 10 suggests significant symptoms.  A worsening CAT score could be explained by an exacerbation, poor medication adherence, poor inhaler technique, or progression of COPD or comorbid conditions.  CAT MCID is 2 points  mMRC: mMRC (Modified Medical Research Council) Dyspnea Scale is used to assess the degree of baseline functional disability in patients of respiratory disease due to dyspnea. No minimal important difference is established. A decrease in score of 1 point or greater is considered a positive change.   Pulmonary Function Assessment:   Exercise Target Goals: Exercise Program Goal: Individual  exercise prescription set using results from initial 6 min walk test and THRR while considering  patient's activity barriers and safety.   Exercise Prescription Goal: Initial exercise prescription builds to 30-45 minutes a day of aerobic activity, 2-3 days per week.  Home exercise guidelines will be given to patient during program as part of exercise prescription that the participant will acknowledge.  Education: Aerobic Exercise: - Group verbal and visual presentation on the components of exercise prescription. Introduces F.I.T.T principle from ACSM for exercise prescriptions.  Reviews F.I.T.T. principles of aerobic exercise including progression. Written material given at graduation.   Education: Resistance Exercise: - Group verbal and visual presentation on the components of exercise prescription. Introduces F.I.T.T principle from ACSM for exercise prescriptions  Reviews F.I.T.T. principles of resistance exercise including progression. Written material given at graduation.    Education: Exercise & Equipment Safety: - Individual verbal instruction and demonstration of equipment use and safety with use of the equipment. Flowsheet Row Pulmonary Rehab from 10/20/2022 in [facility] Cardiac and Pulmonary Rehab  Education need identified 10/20/22  Date 10/20/22  Educator KW  Instruction Review Code 1- Verbalizes Understanding       Education: Exercise Physiology & General Exercise Guidelines: - Group verbal and written instruction with models to review the exercise physiology of the cardiovascular system and associated critical values. Provides general exercise guidelines with specific guidelines to those with heart or lung disease.    Education: Flexibility, Balance, Mind/Body Relaxation: - Group verbal and visual presentation with interactive activity on the components of exercise prescription. Introduces F.I.T.T principle from ACSM for exercise prescriptions. Reviews F.I.T.T. principles of  flexibility and balance exercise training including progression. Also discusses the mind body connection.  Reviews various relaxation techniques to help reduce and manage stress (i.e. Deep breathing, progressive muscle relaxation, and visualization). Balance handout provided to take home. Written material given at graduation.   Activity Barriers & Risk Stratification:  Activity Barriers & Cardiac Risk Stratification - 10/20/22 1645       Activity Barriers & Cardiac Risk Stratification   Activity Barriers Joint Problems;Balance Concerns;Shortness of Breath;Assistive Device;Deconditioning;Muscular Weakness;Arthritis;History of Falls    Comments Left Ankle- plates/screws in 2022 after fall (turns out), right knee pain             6 Minute Walk:  6 Minute Walk     Row Name 10/20/22 1646         6 Minute Walk   Phase Initial     Distance 125 feet     Walk Time 2.06 minutes     # of Rest Breaks 3  1:17-1:45; 3:30-4:56; stopped at 5:40     MPH 0.68     METS 0.06     RPE 13     Perceived Dyspnea  3     VO2 Peak 0.22     Symptoms Yes (comment)     Comments SOB, fatigue, right knee pain 7/10, shuffled feet/unbalanced gait     Resting HR 88 bpm     Resting BP 104/66     Resting Oxygen Saturation  93 %     Exercise Oxygen Saturation  during 6 min walk 91 %     Max Ex. HR 125 bpm     Max Ex. BP 142/68     2 Minute Post BP 110/62       Interval HR   1 Minute HR 125     2 Minute HR 109     3 Minute HR 105     4 Minute HR 104     5 Minute HR 108  6 Minute HR 120  stopped at 5:40     2 Minute Post HR 96     Interval Heart Rate? Yes       Interval Oxygen   Interval Oxygen? Yes     Baseline Oxygen Saturation % 93 %     1 Minute Oxygen Saturation % 91 %     1 Minute Liters of Oxygen 0 L  RA     2 Minute Oxygen Saturation % 96 %     2 Minute Liters of Oxygen 0 L     3 Minute Oxygen Saturation % 96 %     3 Minute Liters of Oxygen 0 L     4 Minute Oxygen Saturation % 95 %      4 Minute Liters of Oxygen 0 L     5 Minute Oxygen Saturation % 96 %     5 Minute Liters of Oxygen 0 L     6 Minute Oxygen Saturation % 97 %     6 Minute Liters of Oxygen 0 L     2 Minute Post Oxygen Saturation % 98 %     2 Minute Post Liters of Oxygen 0 L             Oxygen Initial Assessment:  Oxygen Initial Assessment - 10/20/22 1635       Home Oxygen   Home Oxygen Device E-Tanks    Sleep Oxygen Prescription Continuous    Liters per minute 2    Home Exercise Oxygen Prescription None   2L prn- hasn't used in a while   Home Resting Oxygen Prescription None   2L prn- hasn't used in a while   Compliance with Home Oxygen Use Yes      Initial 6 min Walk   Oxygen Used None      Program Oxygen Prescription   Program Oxygen Prescription None   Can use 2L prn     Intervention   Short Term Goals To learn and understand importance of monitoring SPO2 with pulse oximeter and demonstrate accurate use of the pulse oximeter.;To learn and understand importance of maintaining oxygen saturations>88%;To learn and demonstrate proper pursed lip breathing techniques or other breathing techniques. ;To learn and demonstrate proper use of respiratory medications;To learn and exhibit compliance with exercise, home and travel O2 prescription    Long  Term Goals Verbalizes importance of monitoring SPO2 with pulse oximeter and return demonstration;Maintenance of O2 saturations>88%;Exhibits proper breathing techniques, such as pursed lip breathing or other method taught during program session;Compliance with respiratory medication;Exhibits compliance with exercise, home  and travel O2 prescription             Oxygen Re-Evaluation:   Oxygen Discharge (Final Oxygen Re-Evaluation):   Initial Exercise Prescription:  Initial Exercise Prescription - 10/20/22 1600       Date of Initial Exercise RX and Referring Provider   Date 10/20/22    Referring Provider Ottie Glazier MD      Oxygen    Oxygen Continuous    Liters 2L prn    Maintain Oxygen Saturation 88% or higher      NuStep   Level 1    SPM 80   start with 60   Minutes 15    METs 1      T5 Nustep   Level 1    SPM 80   start with 60   Minutes 15    METs 1      Biostep-RELP  Level 1    SPM 50   start with 30   Minutes 15    METs 1      Track   Laps 3   breaks as needed   Minutes 15    METs 1      Prescription Details   Frequency (times per week) 2    Duration Progress to 30 minutes of continuous aerobic without signs/symptoms of physical distress      Intensity   THRR 40-80% of Max Heartrate 112- 136    Ratings of Perceived Exertion 11-13    Perceived Dyspnea 0-4      Progression   Progression Continue to progress workloads to maintain intensity without signs/symptoms of physical distress.      Resistance Training   Training Prescription Yes    Weight 3 lb    Reps 10-15             Perform Capillary Blood Glucose checks as needed.  Exercise Prescription Changes:   Exercise Prescription Changes     Row Name 10/20/22 1600             Response to Exercise   Blood Pressure (Admit) 104/66       Blood Pressure (Exercise) 142/68       Blood Pressure (Exit) 110/62       Heart Rate (Admit) 88 bpm       Heart Rate (Exercise) 125 bpm       Heart Rate (Exit) 96 bpm       Oxygen Saturation (Admit) 93 %       Oxygen Saturation (Exercise) 91 %       Oxygen Saturation (Exit) 98 %       Rating of Perceived Exertion (Exercise) 13       Perceived Dyspnea (Exercise) 3       Symptoms SOB, fatigue, shuffled feet, right knee pain 7/10       Comments walk test results                Exercise Comments:   Exercise Goals and Review:   Exercise Goals     Row Name 10/20/22 1659             Exercise Goals   Increase Physical Activity Yes       Intervention Provide advice, education, support and counseling about physical activity/exercise needs.;Develop an individualized exercise  prescription for aerobic and resistive training based on initial evaluation findings, risk stratification, comorbidities and participant's personal goals.       Expected Outcomes Short Term: Attend rehab on a regular basis to increase amount of physical activity.;Long Term: Add in home exercise to make exercise part of routine and to increase amount of physical activity.;Long Term: Exercising regularly at least 3-5 days a week.       Increase Strength and Stamina Yes       Intervention Provide advice, education, support and counseling about physical activity/exercise needs.;Develop an individualized exercise prescription for aerobic and resistive training based on initial evaluation findings, risk stratification, comorbidities and participant's personal goals.       Expected Outcomes Short Term: Increase workloads from initial exercise prescription for resistance, speed, and METs.;Short Term: Perform resistance training exercises routinely during rehab and add in resistance training at home;Long Term: Improve cardiorespiratory fitness, muscular endurance and strength as measured by increased METs and functional capacity (6MWT)       Able to understand and use rate of perceived exertion (RPE) scale  Yes       Intervention Provide education and explanation on how to use RPE scale       Expected Outcomes Short Term: Able to use RPE daily in rehab to express subjective intensity level;Long Term:  Able to use RPE to guide intensity level when exercising independently       Able to understand and use Dyspnea scale Yes       Intervention Provide education and explanation on how to use Dyspnea scale       Expected Outcomes Short Term: Able to use Dyspnea scale daily in rehab to express subjective sense of shortness of breath during exertion;Long Term: Able to use Dyspnea scale to guide intensity level when exercising independently       Knowledge and understanding of Target Heart Rate Range (THRR) Yes        Intervention Provide education and explanation of THRR including how the numbers were predicted and where they are located for reference       Expected Outcomes Long Term: Able to use THRR to govern intensity when exercising independently;Short Term: Able to use daily as guideline for intensity in rehab;Short Term: Able to state/look up THRR       Able to check pulse independently Yes       Intervention Provide education and demonstration on how to check pulse in carotid and radial arteries.;Review the importance of being able to check your own pulse for safety during independent exercise       Expected Outcomes Long Term: Able to check pulse independently and accurately;Short Term: Able to explain why pulse checking is important during independent exercise       Understanding of Exercise Prescription Yes       Intervention Provide education, explanation, and written materials on patient's individual exercise prescription       Expected Outcomes Short Term: Able to explain program exercise prescription;Long Term: Able to explain home exercise prescription to exercise independently                Exercise Goals Re-Evaluation :   Discharge Exercise Prescription (Final Exercise Prescription Changes):  Exercise Prescription Changes - 10/20/22 1600       Response to Exercise   Blood Pressure (Admit) 104/66    Blood Pressure (Exercise) 142/68    Blood Pressure (Exit) 110/62    Heart Rate (Admit) 88 bpm    Heart Rate (Exercise) 125 bpm    Heart Rate (Exit) 96 bpm    Oxygen Saturation (Admit) 93 %    Oxygen Saturation (Exercise) 91 %    Oxygen Saturation (Exit) 98 %    Rating of Perceived Exertion (Exercise) 13    Perceived Dyspnea (Exercise) 3    Symptoms SOB, fatigue, shuffled feet, right knee pain 7/10    Comments walk test results             Nutrition:  Target Goals: Understanding of nutrition guidelines, daily intake of sodium '1500mg'$ , cholesterol '200mg'$ , calories 30% from  fat and 7% or less from saturated fats, daily to have 5 or more servings of fruits and vegetables.  Education: All About Nutrition: -Group instruction provided by verbal, written material, interactive activities, discussions, models, and posters to present general guidelines for heart healthy nutrition including fat, fiber, MyPlate, the role of sodium in heart healthy nutrition, utilization of the nutrition label, and utilization of this knowledge for meal planning. Follow up email sent as well. Written material given at graduation. Flowsheet Row Pulmonary Rehab  from 10/20/2022 in [facility] Cardiac and Pulmonary Rehab  Education need identified 10/20/22       Biometrics:  Pre Biometrics - 10/20/22 1641       Pre Biometrics   Height '5\' 10"'$  (1.778 m)   Per pt- did not complete at rehab   Weight 309 lb 14.4 oz (140.6 kg)    Waist Circumference 55.5 inches    Hip Circumference 56.5 inches    Waist to Hip Ratio 0.98 %    BMI (Calculated) 44.47    Single Leg Stand 0 seconds              Nutrition Therapy Plan and Nutrition Goals:  Nutrition Therapy & Goals - 10/20/22 1638       Intervention Plan   Intervention Prescribe, educate and counsel regarding individualized specific dietary modifications aiming towards targeted core components such as weight, hypertension, lipid management, diabetes, heart failure and other comorbidities.    Expected Outcomes Short Term Goal: Understand basic principles of dietary content, such as calories, fat, sodium, cholesterol and nutrients.;Short Term Goal: A plan has been developed with personal nutrition goals set during dietitian appointment.;Long Term Goal: Adherence to prescribed nutrition plan.             Nutrition Assessments:  MEDIFICTS Score Key: ?70 Need to make dietary changes  40-70 Heart Healthy Diet ? 40 Therapeutic Level Cholesterol Diet  Flowsheet Row Pulmonary Rehab from 10/20/2022 in [facility] Cardiac and Pulmonary Rehab  Picture  Your Plate Total Score on Admission 39      Picture Your Plate Scores: [id number] Unhealthy dietary pattern with much room for improvement. 41-50 Dietary pattern unlikely to meet recommendations for good health and room for improvement. 51-60 More healthful dietary pattern, with some room for improvement.  >60 Healthy dietary pattern, although there may be some specific behaviors that could be improved.   Nutrition Goals Re-Evaluation:   Nutrition Goals Discharge (Final Nutrition Goals Re-Evaluation):   Psychosocial: Target Goals: Acknowledge presence or absence of significant depression and/or stress, maximize coping skills, provide positive support system. Participant is able to verbalize types and ability to use techniques and skills needed for reducing stress and depression.   Education: Stress, Anxiety, and Depression - Group verbal and visual presentation to define topics covered.  Reviews how body is impacted by stress, anxiety, and depression.  Also discusses healthy ways to reduce stress and to treat/manage anxiety and depression.  Written material given at graduation.   Education: Sleep Hygiene -Provides group verbal and written instruction about how sleep can affect your health.  Define sleep hygiene, discuss sleep cycles and impact of sleep habits. Review good sleep hygiene tips.    Initial Review & Psychosocial Screening:  Initial Psych Review & Screening - 10/14/22 1455       Initial Review   Current issues with Current Psychotropic Meds      Family Dynamics   Good Support System? Yes   daughter     Barriers   Psychosocial barriers to participate in program There are no identifiable barriers or psychosocial needs.;The patient should benefit from training in stress management and relaxation.      Screening Interventions   Interventions Encouraged to exercise    Expected Outcomes Short Term goal: Utilizing psychosocial counselor, staff and physician to assist with  identification of specific Stressors or current issues interfering with healing process. Setting desired goal for each stressor or current issue identified.;Long Term Goal: Stressors or current issues are controlled or eliminated.;Short Term  goal: Identification and review with participant of any Quality of Life or Depression concerns found by scoring the questionnaire.;Long Term goal: The participant improves quality of Life and PHQ9 Scores as seen by post scores and/or verbalization of changes             Quality of Life Scores:  Scores of 19 and below usually indicate a poorer quality of life in these areas.  A difference of  2-3 points is a clinically meaningful difference.  A difference of 2-3 points in the total score of the Quality of Life Index has been associated with significant improvement in overall quality of life, self-image, physical symptoms, and general health in studies assessing change in quality of life.  PHQ-9: Review Flowsheet       10/20/2022 08/11/2021 06/30/2021 05/15/2021  Depression screen PHQ 2/9  Decreased Interest 0 0 0 0  Down, Depressed, Hopeless 1 1 0 1  PHQ - 2 Score 1 1 0 1  Altered sleeping 2 - - -  Tired, decreased energy 1 - - -  Change in appetite 0 - - -  Feeling bad or failure about yourself  0 - - -  Trouble concentrating 0 - - -  Moving slowly or fidgety/restless 0 - - -  Suicidal thoughts 0 - - -  PHQ-9 Score 4 - - -  Difficult doing work/chores Not difficult at all - - -   Interpretation of Total Score  Total Score Depression Severity:  1-4 = Minimal depression, 5-9 = Mild depression, 10-14 = Moderate depression, 15-19 = Moderately severe depression, 20-27 = Severe depression   Psychosocial Evaluation and Intervention:  Psychosocial Evaluation - 10/14/22 1458       Psychosocial Evaluation & Interventions   Interventions Encouraged to exercise with the program and follow exercise prescription    Comments Beuford is coming to pulmonary  rehab for dyspnea for exertion. His daughter accompanies him to all his appointments to help him out. His breathing has worsened recently. He has a history of falls (s/p ankel surgery 2022) and uses a walker and wheelchair as needed. His daughter answered most of the questions for him. He did state that he does not have any stress concerns and feels like the Wellbutrin  and Zoloft is helping meet his needs. He wants to come to the program to regain some strength and learn more about breathing techniques.    Expected Outcomes Short: attend pulmonary rehab for education and exercise. Long: develop and maintain positive self care habits.    Continue Psychosocial Services  Follow up required by staff             Psychosocial Re-Evaluation:   Psychosocial Discharge (Final Psychosocial Re-Evaluation):   Education: Education Goals: Education classes will be provided on a weekly basis, covering required topics. Participant will state understanding/return demonstration of topics presented.  Learning Barriers/Preferences:  Learning Barriers/Preferences - 10/14/22 1455       Learning Barriers/Preferences   Learning Barriers None    Learning Preferences Individual Instruction             General Pulmonary Education Topics:  Infection Prevention: - Provides verbal and written material to individual with discussion of infection control including proper hand washing and proper equipment cleaning during exercise session. Flowsheet Row Pulmonary Rehab from 10/20/2022 in [facility] Cardiac and Pulmonary Rehab  Education need identified 10/20/22  Date 10/20/22  Educator KW  Instruction Review Code 1- Verbalizes Understanding       Falls Prevention: -  Provides verbal and written material to individual with discussion of falls prevention and safety. Flowsheet Row Pulmonary Rehab from 10/20/2022 in [facility] Cardiac and Pulmonary Rehab  Education need identified 10/20/22  Date 10/20/22  Educator KW   Instruction Review Code 1- Verbalizes Understanding       Chronic Lung Disease Review: - Group verbal instruction with posters, models, PowerPoint presentations and videos,  to review new updates, new respiratory medications, new advancements in procedures and treatments. Providing information on websites and "800" numbers for continued self-education. Includes information about supplement oxygen, available portable oxygen systems, continuous and intermittent flow rates, oxygen safety, concentrators, and Medicare reimbursement for oxygen. Explanation of Pulmonary Drugs, including class, frequency, complications, importance of spacers, rinsing mouth after steroid MDI's, and proper cleaning methods for nebulizers. Review of basic lung anatomy and physiology related to function, structure, and complications of lung disease. Review of risk factors. Discussion about methods for diagnosing sleep apnea and types of masks and machines for OSA. Includes a review of the use of types of environmental controls: home humidity, furnaces, filters, dust mite/pet prevention, HEPA vacuums. Discussion about weather changes, air quality and the benefits of nasal washing. Instruction on Warning signs, infection symptoms, calling MD promptly, preventive modes, and value of vaccinations. Review of effective airway clearance, coughing and/or vibration techniques. Emphasizing that all should Create an Action Plan. Written material given at graduation. Flowsheet Row Pulmonary Rehab from 10/20/2022 in [facility] Cardiac and Pulmonary Rehab  Education need identified 10/20/22       AED/CPR: - Group verbal and written instruction with the use of models to demonstrate the basic use of the AED with the basic ABC's of resuscitation.    Anatomy and Cardiac Procedures: - Group verbal and visual presentation and models provide information about basic cardiac anatomy and function. Reviews the testing methods done to diagnose heart  disease and the outcomes of the test results. Describes the treatment choices: Medical Management, Angioplasty, or Coronary Bypass Surgery for treating various heart conditions including Myocardial Infarction, Angina, Valve Disease, and Cardiac Arrhythmias.  Written material given at graduation.   Medication Safety: - Group verbal and visual instruction to review commonly prescribed medications for heart and lung disease. Reviews the medication, class of the drug, and side effects. Includes the steps to properly store meds and maintain the prescription regimen.  Written material given at graduation.   Other: -Provides group and verbal instruction on various topics (see comments)   Knowledge Questionnaire Score:  Knowledge Questionnaire Score - 10/20/22 1633       Knowledge Questionnaire Score   Pre Score 16/18              Core Components/Risk Factors/Patient Goals at Admission:  Personal Goals and Risk Factors at Admission - 10/20/22 1659       Core Components/Risk Factors/Patient Goals on Admission    Weight Management Yes;Obesity;Weight Loss    Intervention Weight Management: Develop a combined nutrition and exercise program designed to reach desired caloric intake, while maintaining appropriate intake of nutrient and fiber, sodium and fats, and appropriate energy expenditure required for the weight goal.;Weight Management: Provide education and appropriate resources to help participant work on and attain dietary goals.;Weight Management/Obesity: Establish reasonable short term and long term weight goals.;Obesity: Provide education and appropriate resources to help participant work on and attain dietary goals.    Admit Weight 309 lb (140.2 kg)    Goal Weight: Short Term 303 lb (137.4 kg)    Goal Weight: Long Term 250  lb (113.4 kg)    Expected Outcomes Short Term: Continue to assess and modify interventions until short term weight is achieved;Long Term: Adherence to nutrition and  physical activity/exercise program aimed toward attainment of established weight goal;Weight Loss: Understanding of general recommendations for a balanced deficit meal plan, which promotes 1-2 lb weight loss per week and includes a negative energy balance of [id number] kcal/d;Understanding recommendations for meals to include 15-35% energy as protein, 25-35% energy from fat, 35-60% energy from carbohydrates, less than '200mg'$  of dietary cholesterol, 20-35 gm of total fiber daily;Understanding of distribution of calorie intake throughout the day with the consumption of 4-5 meals/snacks    Improve shortness of breath with ADL's Yes    Intervention Provide education, individualized exercise plan and daily activity instruction to help decrease symptoms of SOB with activities of daily living.    Expected Outcomes Short Term: Improve cardiorespiratory fitness to achieve a reduction of symptoms when performing ADLs;Long Term: Be able to perform more ADLs without symptoms or delay the onset of symptoms    Hypertension Yes   Hx of hypotension   Intervention Provide education on lifestyle modifcations including regular physical activity/exercise, weight management, moderate sodium restriction and increased consumption of fresh fruit, vegetables, and low fat dairy, alcohol moderation, and smoking cessation.;Monitor prescription use compliance.    Expected Outcomes Short Term: Continued assessment and intervention until BP is < 140/16m HG in hypertensive participants. < 130/814mHG in hypertensive participants with diabetes, heart failure or chronic kidney disease.;Long Term: Maintenance of blood pressure at goal levels.    Lipids Yes    Intervention Provide education and support for participant on nutrition & aerobic/resistive exercise along with prescribed medications to achieve LDL '70mg'$ , HDL >'40mg'$ .    Expected Outcomes Short Term: Participant states understanding of desired cholesterol values and is compliant with  medications prescribed. Participant is following exercise prescription and nutrition guidelines.;Long Term: Cholesterol controlled with medications as prescribed, with individualized exercise RX and with personalized nutrition plan. Value goals: LDL < '70mg'$ , HDL > 40 mg.             Education:Diabetes - Individual verbal and written instruction to review signs/symptoms of diabetes, desired ranges of glucose level fasting, after meals and with exercise. Acknowledge that pre and post exercise glucose checks will be done for 3 sessions at entry of program.   Know Your Numbers and Heart Failure: - Group verbal and visual instruction to discuss disease risk factors for cardiac and pulmonary disease and treatment options.  Reviews associated critical values for Overweight/Obesity, Hypertension, Cholesterol, and Diabetes.  Discusses basics of heart failure: signs/symptoms and treatments.  Introduces Heart Failure Zone chart for action plan for heart failure.  Written material given at graduation.   Core Components/Risk Factors/Patient Goals Review:    Core Components/Risk Factors/Patient Goals at Discharge (Final Review):    ITP Comments:  ITP Comments     Row Name 10/14/22 1449 10/20/22 1631         ITP Comments Initial phone call completed. Diagnosis [facility] be [facility]. EP Orientation scheduled for Wednesday 2/28 at 2:30. Completed 6MWT and gym orientation. Initial ITP created and sent for review to Dr. FuOttie GlazierMedical Director.               Comments: Initial ITP

## 2022-11-01 ENCOUNTER — Encounter: Payer: Medicare Other | Attending: Pulmonary Disease | Admitting: *Deleted

## 2022-11-01 DIAGNOSIS — R0609 Other forms of dyspnea: Secondary | ICD-10-CM | POA: Diagnosis not present

## 2022-11-01 NOTE — Progress Notes (Signed)
Daily Session Note  Patient Details  Name: Emmanual Ragans MRN: [id number] Date of Birth: Aug 08, 1951 Referring Provider:   Flowsheet Row Pulmonary Rehab from 10/20/2022 in [facility] Cardiac and Pulmonary Rehab  Referring Provider Ottie Glazier MD       Encounter Date: 11/01/2022  Check In:  Session Check In - 11/01/22 1701       Check-In   Supervising physician immediately available to respond to emergencies See telemetry face sheet for immediately available ER MD    Location [facility]-Cardiac & Pulmonary Rehab    Staff Present Darlyne Russian, RN, Dimple Nanas, BS, Exercise Physiologist;Joseph Tessie Fass, [place]    Virtual Visit No    Medication changes reported     No    Fall or balance concerns reported    No    Warm-up and Cool-down Performed on first and last piece of equipment    Resistance Training Performed Yes    VAD Patient? No    PAD/SET Patient? No      Pain Assessment   Currently in Pain? No/denies                Social History   Tobacco Use  Smoking Status Never  Smokeless Tobacco Never    Goals Met:  Exercise tolerated well Personal goals reviewed No report of concerns or symptoms today Strength training completed today  Goals Unmet:  Not Applicable  Comments: Pt able to follow exercise prescription today without complaint.  Per staff, pt not appropriate for rehab at this time. Staff spoke with pt and daughter who are in agreement to  be referred to PT.    Dr. Emily Filbert is Medical Director for [facility].  Dr. Ottie Glazier is Medical Director for [facility] Pulmonary Rehabilitation.

## 2022-11-10 ENCOUNTER — Encounter: Payer: Self-pay | Admitting: *Deleted

## 2022-11-10 DIAGNOSIS — R0609 Other forms of dyspnea: Secondary | ICD-10-CM

## 2022-11-10 NOTE — Progress Notes (Signed)
Pulmonary Individual Treatment Plan  Patient Details  Name: Christopher [name] MRN: [id number] Date of Birth: September 03, 1950 Referring Provider:   Flowsheet Row Pulmonary Rehab from 10/20/2022 in [facility] Cardiac and Pulmonary Rehab  Referring Provider Ottie Glazier MD       Initial Encounter Date:  Flowsheet Row Pulmonary Rehab from 10/20/2022 in [facility] Cardiac and Pulmonary Rehab  Date 10/20/22       Visit Diagnosis: Dyspnea on exertion  Patient's Home Medications on Admission:  Current Outpatient Medications:    acetaminophen (TYLENOL) 325 MG tablet, Take 2 tablets (650 mg total) by mouth every 6 (six) hours as needed for mild pain, fever, headache or moderate pain. (Patient not taking: Reported on 10/14/2022), Disp: , Rfl:    aspirin 81 MG EC tablet, Take 81 mg by mouth at bedtime., Disp: , Rfl:    atorvastatin (LIPITOR) 20 MG tablet, Take 20 mg by mouth at bedtime., Disp: , Rfl:    buPROPion (WELLBUTRIN SR) 150 MG 12 hr tablet, Take 150 mg by mouth 2 (two) times daily. After 1st meal of day and after dinner, Disp: , Rfl:    Cholecalciferol 25 MCG (1000 UT) capsule, Take 1,000 Units by mouth at bedtime., Disp: , Rfl:    cyanocobalamin 1000 MCG tablet, Take 1,000 mcg by mouth 2 (two) times a week. At bedtime. Sundays and Wednesdays, Disp: , Rfl:    FLOVENT HFA 110 MCG/ACT inhaler, Inhale 1 puff into the lungs 2 (two) times daily., Disp: , Rfl:    hydrocortisone cream 1 %, Apply topically as needed for itching. (Patient not taking: Reported on 10/14/2022), Disp: 30 g, Rfl: 0   ipratropium (ATROVENT) 0.06 % nasal spray, Place 2 sprays into both nostrils 3 (three) times daily as needed for rhinitis., Disp: , Rfl:    levalbuterol (XOPENEX HFA) 45 MCG/ACT inhaler, Inhale 2 puffs into the lungs every 4 (four) hours as needed for wheezing., Disp: , Rfl:    levalbuterol (XOPENEX) 0.63 MG/3ML nebulizer solution, Take 3 mLs (0.63 mg total) by nebulization every 6 (six) hours as needed for wheezing or  shortness of breath., Disp: 3 mL, Rfl: 12   levothyroxine (SYNTHROID) 88 MCG tablet, Take 88 mcg by mouth daily. 30 to 60 minutes before breakfast on an empty stomach and with a glass of water, Disp: , Rfl:    loratadine (CLARITIN) 10 MG tablet, Take 10 mg by mouth daily., Disp: , Rfl:    Magnesium 250 MG TABS, Take 1 tablet by mouth daily., Disp: , Rfl:    midodrine (PROAMATINE) 10 MG tablet, Take 1 tablet (10 mg total) by mouth 3 (three) times daily with meals., Disp: 90 tablet, Rfl: 0   Multiple Vitamins-Minerals (PRESERVISION AREDS 2) CAPS, Take 1 capsule by mouth 2 (two) times daily. With food. After 1st meal of day and after dinner, Disp: , Rfl:    OXYGEN, Inhale 2 L into the lungs continuous., Disp: , Rfl:    potassium chloride SA (KLOR-CON) 20 MEQ tablet, Take 1 tablet (20 mEq total) by mouth daily., Disp: 30 tablet, Rfl: 0   sertraline (ZOLOFT) 50 MG tablet, Take 50 mg by mouth daily., Disp: , Rfl:    torsemide (DEMADEX) 20 MG tablet, Take 1 tablet (20 mg total) by mouth daily., Disp: , Rfl:    traMADol (ULTRAM) 50 MG tablet, Take 50 mg by mouth every 12 (twelve) hours as needed for moderate pain., Disp: , Rfl:    trolamine salicylate (ASPERCREME) 10 % cream, Apply 1 application topically  as needed for muscle pain. Apply to knees and feet, Disp: , Rfl:    Vitamins A & D (VITAMIN A & D) ointment, Apply 1 application topically as needed. To groin area., Disp: , Rfl:   Past Medical History: Past Medical History:  Diagnosis Date   Anxiety    CHF (congestive heart failure) (HCC)    COPD (chronic obstructive pulmonary disease) ([place])    Depression    Dysrhythmia    Hyperlipidemia    Hypertension    Hypothyroidism    Sleep apnea     Tobacco Use: Social History   Tobacco Use  Smoking Status Never  Smokeless Tobacco Never    Labs: Review Flowsheet  More data exists      Latest Ref Rng & Units 03/22/2021 03/23/2021 04/20/2021 04/21/2021 05/20/2021  Labs for ITP Cardiac and Pulmonary  Rehab  Cholestrol 0 - 200 mg/dL - 88  - - -  LDL (calc) 0 - 99 mg/dL - 46  - - -  HDL-C >40 mg/dL - 26  - - -  Trlycerides <150 mg/dL - 78  - - -  PH, Arterial 7.350 - 7.450 - - 7.33  7.32  7.33  7.50   PCO2 arterial 32.0 - 48.0 mmHg - - 80  82  80  42   Bicarbonate 20.0 - 28.0 mmol/L 49.1  - 42.2  42.2  42.2  32.5  36.0   O2 Saturation % 58.7  - 93.6  95.8  94.3  98.5  45.7      Pulmonary Assessment Scores:  Pulmonary Assessment Scores     Row Name 10/20/22 1635         ADL UCSD   ADL Phase Entry     SOB Score total 52     Rest 0     Walk 2     Stairs 4     Bath 3     Dress 1     Shop 3       CAT Score   CAT Score 18       mMRC Score   mMRC Score 2              UCSD: Self-administered rating of dyspnea associated with activities of daily living (ADLs) 6-point scale (0 = "not at all" to 5 = "maximal or unable to do because of breathlessness")  Scoring Scores range from 0 to 120.  Minimally important difference is 5 units  CAT: CAT can identify the health impairment of COPD patients and is better correlated with disease progression.  CAT has a scoring range of zero to 40. The CAT score is classified into four groups of low (less than 10), medium (10 - 20), high (21-30) and very high (31-40) based on the impact level of disease on health status. A CAT score over 10 suggests significant symptoms.  A worsening CAT score could be explained by an exacerbation, poor medication adherence, poor inhaler technique, or progression of COPD or comorbid conditions.  CAT MCID is 2 points  mMRC: mMRC (Modified Medical Research Council) Dyspnea Scale is used to assess the degree of baseline functional disability in patients of respiratory disease due to dyspnea. No minimal important difference is established. A decrease in score of 1 point or greater is considered a positive change.   Pulmonary Function Assessment:   Exercise Target Goals: Exercise Program Goal: Individual  exercise prescription set using results from initial 6 min walk test and THRR while considering  patient's activity barriers and safety.   Exercise Prescription Goal: Initial exercise prescription builds to 30-45 minutes a day of aerobic activity, 2-3 days per week.  Home exercise guidelines will be given to patient during program as part of exercise prescription that the participant will acknowledge.  Education: Aerobic Exercise: - Group verbal and visual presentation on the components of exercise prescription. Introduces F.I.T.T principle from ACSM for exercise prescriptions.  Reviews F.I.T.T. principles of aerobic exercise including progression. Written material given at graduation.   Education: Resistance Exercise: - Group verbal and visual presentation on the components of exercise prescription. Introduces F.I.T.T principle from ACSM for exercise prescriptions  Reviews F.I.T.T. principles of resistance exercise including progression. Written material given at graduation.    Education: Exercise & Equipment Safety: - Individual verbal instruction and demonstration of equipment use and safety with use of the equipment. Flowsheet Row Pulmonary Rehab from 10/20/2022 in [facility] Cardiac and Pulmonary Rehab  Education need identified 10/20/22  Date 10/20/22  Educator KW  Instruction Review Code 1- Verbalizes Understanding       Education: Exercise Physiology & General Exercise Guidelines: - Group verbal and written instruction with models to review the exercise physiology of the cardiovascular system and associated critical values. Provides general exercise guidelines with specific guidelines to those with heart or lung disease.    Education: Flexibility, Balance, Mind/Body Relaxation: - Group verbal and visual presentation with interactive activity on the components of exercise prescription. Introduces F.I.T.T principle from ACSM for exercise prescriptions. Reviews F.I.T.T. principles of  flexibility and balance exercise training including progression. Also discusses the mind body connection.  Reviews various relaxation techniques to help reduce and manage stress (i.e. Deep breathing, progressive muscle relaxation, and visualization). Balance handout provided to take home. Written material given at graduation.   Activity Barriers & Risk Stratification:  Activity Barriers & Cardiac Risk Stratification - 10/20/22 1645       Activity Barriers & Cardiac Risk Stratification   Activity Barriers Joint Problems;Balance Concerns;Shortness of Breath;Assistive Device;Deconditioning;Muscular Weakness;Arthritis;History of Falls    Comments Left Ankle- plates/screws in 2022 after fall (turns out), right knee pain             6 Minute Walk:  6 Minute Walk     Row Name 10/20/22 1646         6 Minute Walk   Phase Initial     Distance 125 feet     Walk Time 2.06 minutes     # of Rest Breaks 3  1:17-1:45; 3:30-4:56; stopped at 5:40     MPH 0.68     METS 0.06     RPE 13     Perceived Dyspnea  3     VO2 Peak 0.22     Symptoms Yes (comment)     Comments SOB, fatigue, right knee pain 7/10, shuffled feet/unbalanced gait     Resting HR 88 bpm     Resting BP 104/66     Resting Oxygen Saturation  93 %     Exercise Oxygen Saturation  during 6 min walk 91 %     Max Ex. HR 125 bpm     Max Ex. BP 142/68     2 Minute Post BP 110/62       Interval HR   1 Minute HR 125     2 Minute HR 109     3 Minute HR 105     4 Minute HR 104     5 Minute HR 108  6 Minute HR 120  stopped at 5:40     2 Minute Post HR 96     Interval Heart Rate? Yes       Interval Oxygen   Interval Oxygen? Yes     Baseline Oxygen Saturation % 93 %     1 Minute Oxygen Saturation % 91 %     1 Minute Liters of Oxygen 0 L  RA     2 Minute Oxygen Saturation % 96 %     2 Minute Liters of Oxygen 0 L     3 Minute Oxygen Saturation % 96 %     3 Minute Liters of Oxygen 0 L     4 Minute Oxygen Saturation % 95 %      4 Minute Liters of Oxygen 0 L     5 Minute Oxygen Saturation % 96 %     5 Minute Liters of Oxygen 0 L     6 Minute Oxygen Saturation % 97 %     6 Minute Liters of Oxygen 0 L     2 Minute Post Oxygen Saturation % 98 %     2 Minute Post Liters of Oxygen 0 L             Oxygen Initial Assessment:  Oxygen Initial Assessment - 10/20/22 1635       Home Oxygen   Home Oxygen Device E-Tanks    Sleep Oxygen Prescription Continuous    Liters per minute 2    Home Exercise Oxygen Prescription None   2L prn- hasn't used in a while   Home Resting Oxygen Prescription None   2L prn- hasn't used in a while   Compliance with Home Oxygen Use Yes      Initial 6 min Walk   Oxygen Used None      Program Oxygen Prescription   Program Oxygen Prescription None   Can use 2L prn     Intervention   Short Term Goals To learn and understand importance of monitoring SPO2 with pulse oximeter and demonstrate accurate use of the pulse oximeter.;To learn and understand importance of maintaining oxygen saturations>88%;To learn and demonstrate proper pursed lip breathing techniques or other breathing techniques. ;To learn and demonstrate proper use of respiratory medications;To learn and exhibit compliance with exercise, home and travel O2 prescription    Long  Term Goals Verbalizes importance of monitoring SPO2 with pulse oximeter and return demonstration;Maintenance of O2 saturations>88%;Exhibits proper breathing techniques, such as pursed lip breathing or other method taught during program session;Compliance with respiratory medication;Exhibits compliance with exercise, home  and travel O2 prescription             Oxygen Re-Evaluation:   Oxygen Discharge (Final Oxygen Re-Evaluation):   Initial Exercise Prescription:  Initial Exercise Prescription - 10/20/22 1600       Date of Initial Exercise RX and Referring Provider   Date 10/20/22    Referring Provider Ottie Glazier MD      Oxygen    Oxygen Continuous    Liters 2L prn    Maintain Oxygen Saturation 88% or higher      NuStep   Level 1    SPM 80   start with 60   Minutes 15    METs 1      T5 Nustep   Level 1    SPM 80   start with 60   Minutes 15    METs 1      Biostep-RELP  Level 1    SPM 50   start with 30   Minutes 15    METs 1      Track   Laps 3   breaks as needed   Minutes 15    METs 1      Prescription Details   Frequency (times per week) 2    Duration Progress to 30 minutes of continuous aerobic without signs/symptoms of physical distress      Intensity   THRR 40-80% of Max Heartrate 112- 136    Ratings of Perceived Exertion 11-13    Perceived Dyspnea 0-4      Progression   Progression Continue to progress workloads to maintain intensity without signs/symptoms of physical distress.      Resistance Training   Training Prescription Yes    Weight 3 lb    Reps 10-15             Perform Capillary Blood Glucose checks as needed.  Exercise Prescription Changes:   Exercise Prescription Changes     Row Name 10/20/22 1600             Response to Exercise   Blood Pressure (Admit) 104/66       Blood Pressure (Exercise) 142/68       Blood Pressure (Exit) 110/62       Heart Rate (Admit) 88 bpm       Heart Rate (Exercise) 125 bpm       Heart Rate (Exit) 96 bpm       Oxygen Saturation (Admit) 93 %       Oxygen Saturation (Exercise) 91 %       Oxygen Saturation (Exit) 98 %       Rating of Perceived Exertion (Exercise) 13       Perceived Dyspnea (Exercise) 3       Symptoms SOB, fatigue, shuffled feet, right knee pain 7/10       Comments walk test results                Exercise Comments:   Exercise Goals and Review:   Exercise Goals     Row Name 10/20/22 1659             Exercise Goals   Increase Physical Activity Yes       Intervention Provide advice, education, support and counseling about physical activity/exercise needs.;Develop an individualized exercise  prescription for aerobic and resistive training based on initial evaluation findings, risk stratification, comorbidities and participant's personal goals.       Expected Outcomes Short Term: Attend rehab on a regular basis to increase amount of physical activity.;Long Term: Add in home exercise to make exercise part of routine and to increase amount of physical activity.;Long Term: Exercising regularly at least 3-5 days a week.       Increase Strength and Stamina Yes       Intervention Provide advice, education, support and counseling about physical activity/exercise needs.;Develop an individualized exercise prescription for aerobic and resistive training based on initial evaluation findings, risk stratification, comorbidities and participant's personal goals.       Expected Outcomes Short Term: Increase workloads from initial exercise prescription for resistance, speed, and METs.;Short Term: Perform resistance training exercises routinely during rehab and add in resistance training at home;Long Term: Improve cardiorespiratory fitness, muscular endurance and strength as measured by increased METs and functional capacity (6MWT)       Able to understand and use rate of perceived exertion (RPE) scale  Yes       Intervention Provide education and explanation on how to use RPE scale       Expected Outcomes Short Term: Able to use RPE daily in rehab to express subjective intensity level;Long Term:  Able to use RPE to guide intensity level when exercising independently       Able to understand and use Dyspnea scale Yes       Intervention Provide education and explanation on how to use Dyspnea scale       Expected Outcomes Short Term: Able to use Dyspnea scale daily in rehab to express subjective sense of shortness of breath during exertion;Long Term: Able to use Dyspnea scale to guide intensity level when exercising independently       Knowledge and understanding of Target Heart Rate Range (THRR) Yes        Intervention Provide education and explanation of THRR including how the numbers were predicted and where they are located for reference       Expected Outcomes Long Term: Able to use THRR to govern intensity when exercising independently;Short Term: Able to use daily as guideline for intensity in rehab;Short Term: Able to state/look up THRR       Able to check pulse independently Yes       Intervention Provide education and demonstration on how to check pulse in carotid and radial arteries.;Review the importance of being able to check your own pulse for safety during independent exercise       Expected Outcomes Long Term: Able to check pulse independently and accurately;Short Term: Able to explain why pulse checking is important during independent exercise       Understanding of Exercise Prescription Yes       Intervention Provide education, explanation, and written materials on patient's individual exercise prescription       Expected Outcomes Short Term: Able to explain program exercise prescription;Long Term: Able to explain home exercise prescription to exercise independently                Exercise Goals Re-Evaluation :   Discharge Exercise Prescription (Final Exercise Prescription Changes):  Exercise Prescription Changes - 10/20/22 1600       Response to Exercise   Blood Pressure (Admit) 104/66    Blood Pressure (Exercise) 142/68    Blood Pressure (Exit) 110/62    Heart Rate (Admit) 88 bpm    Heart Rate (Exercise) 125 bpm    Heart Rate (Exit) 96 bpm    Oxygen Saturation (Admit) 93 %    Oxygen Saturation (Exercise) 91 %    Oxygen Saturation (Exit) 98 %    Rating of Perceived Exertion (Exercise) 13    Perceived Dyspnea (Exercise) 3    Symptoms SOB, fatigue, shuffled feet, right knee pain 7/10    Comments walk test results             Nutrition:  Target Goals: Understanding of nutrition guidelines, daily intake of sodium '1500mg'$ , cholesterol '200mg'$ , calories 30% from  fat and 7% or less from saturated fats, daily to have 5 or more servings of fruits and vegetables.  Education: All About Nutrition: -Group instruction provided by verbal, written material, interactive activities, discussions, models, and posters to present general guidelines for heart healthy nutrition including fat, fiber, MyPlate, the role of sodium in heart healthy nutrition, utilization of the nutrition label, and utilization of this knowledge for meal planning. Follow up email sent as well. Written material given at graduation. Flowsheet Row Pulmonary Rehab  from 10/20/2022 in [facility] Cardiac and Pulmonary Rehab  Education need identified 10/20/22       Biometrics:  Pre Biometrics - 10/20/22 1641       Pre Biometrics   Height '5\' 10"'$  (1.778 m)   Per pt- did not complete at rehab   Weight 309 lb 14.4 oz (140.6 kg)    Waist Circumference 55.5 inches    Hip Circumference 56.5 inches    Waist to Hip Ratio 0.98 %    BMI (Calculated) 44.47    Single Leg Stand 0 seconds              Nutrition Therapy Plan and Nutrition Goals:  Nutrition Therapy & Goals - 10/20/22 1638       Intervention Plan   Intervention Prescribe, educate and counsel regarding individualized specific dietary modifications aiming towards targeted core components such as weight, hypertension, lipid management, diabetes, heart failure and other comorbidities.    Expected Outcomes Short Term Goal: Understand basic principles of dietary content, such as calories, fat, sodium, cholesterol and nutrients.;Short Term Goal: A plan has been developed with personal nutrition goals set during dietitian appointment.;Long Term Goal: Adherence to prescribed nutrition plan.             Nutrition Assessments:  MEDIFICTS Score Key: ?70 Need to make dietary changes  40-70 Heart Healthy Diet ? 40 Therapeutic Level Cholesterol Diet  Flowsheet Row Pulmonary Rehab from 10/20/2022 in [facility] Cardiac and Pulmonary Rehab  Picture  Your Plate Total Score on Admission 39      Picture Your Plate Scores: [id number] Unhealthy dietary pattern with much room for improvement. 41-50 Dietary pattern unlikely to meet recommendations for good health and room for improvement. 51-60 More healthful dietary pattern, with some room for improvement.  >60 Healthy dietary pattern, although there may be some specific behaviors that could be improved.   Nutrition Goals Re-Evaluation:   Nutrition Goals Discharge (Final Nutrition Goals Re-Evaluation):   Psychosocial: Target Goals: Acknowledge presence or absence of significant depression and/or stress, maximize coping skills, provide positive support system. Participant is able to verbalize types and ability to use techniques and skills needed for reducing stress and depression.   Education: Stress, Anxiety, and Depression - Group verbal and visual presentation to define topics covered.  Reviews how body is impacted by stress, anxiety, and depression.  Also discusses healthy ways to reduce stress and to treat/manage anxiety and depression.  Written material given at graduation.   Education: Sleep Hygiene -Provides group verbal and written instruction about how sleep can affect your health.  Define sleep hygiene, discuss sleep cycles and impact of sleep habits. Review good sleep hygiene tips.    Initial Review & Psychosocial Screening:  Initial Psych Review & Screening - 10/14/22 1455       Initial Review   Current issues with Current Psychotropic Meds      Family Dynamics   Good Support System? Yes   daughter     Barriers   Psychosocial barriers to participate in program There are no identifiable barriers or psychosocial needs.;The patient should benefit from training in stress management and relaxation.      Screening Interventions   Interventions Encouraged to exercise    Expected Outcomes Short Term goal: Utilizing psychosocial counselor, staff and physician to assist with  identification of specific Stressors or current issues interfering with healing process. Setting desired goal for each stressor or current issue identified.;Long Term Goal: Stressors or current issues are controlled or eliminated.;Short Term  goal: Identification and review with participant of any Quality of Life or Depression concerns found by scoring the questionnaire.;Long Term goal: The participant improves quality of Life and PHQ9 Scores as seen by post scores and/or verbalization of changes             Quality of Life Scores:  Scores of 19 and below usually indicate a poorer quality of life in these areas.  A difference of  2-3 points is a clinically meaningful difference.  A difference of 2-3 points in the total score of the Quality of Life Index has been associated with significant improvement in overall quality of life, self-image, physical symptoms, and general health in studies assessing change in quality of life.  PHQ-9: Review Flowsheet       10/20/2022 08/11/2021 06/30/2021 05/15/2021  Depression screen PHQ 2/9  Decreased Interest 0 0 0 0  Down, Depressed, Hopeless 1 1 0 1  PHQ - 2 Score 1 1 0 1  Altered sleeping 2 - - -  Tired, decreased energy 1 - - -  Change in appetite 0 - - -  Feeling bad or failure about yourself  0 - - -  Trouble concentrating 0 - - -  Moving slowly or fidgety/restless 0 - - -  Suicidal thoughts 0 - - -  PHQ-9 Score 4 - - -  Difficult doing work/chores Not difficult at all - - -   Interpretation of Total Score  Total Score Depression Severity:  1-4 = Minimal depression, 5-9 = Mild depression, 10-14 = Moderate depression, 15-19 = Moderately severe depression, 20-27 = Severe depression   Psychosocial Evaluation and Intervention:  Psychosocial Evaluation - 10/14/22 1458       Psychosocial Evaluation & Interventions   Interventions Encouraged to exercise with the program and follow exercise prescription    Comments Beuford is coming to pulmonary  rehab for dyspnea for exertion. His daughter accompanies him to all his appointments to help him out. His breathing has worsened recently. He has a history of falls (s/p ankel surgery 2022) and uses a walker and wheelchair as needed. His daughter answered most of the questions for him. He did state that he does not have any stress concerns and feels like the Wellbutrin  and Zoloft is helping meet his needs. He wants to come to the program to regain some strength and learn more about breathing techniques.    Expected Outcomes Short: attend pulmonary rehab for education and exercise. Long: develop and maintain positive self care habits.    Continue Psychosocial Services  Follow up required by staff             Psychosocial Re-Evaluation:   Psychosocial Discharge (Final Psychosocial Re-Evaluation):   Education: Education Goals: Education classes will be provided on a weekly basis, covering required topics. Participant will state understanding/return demonstration of topics presented.  Learning Barriers/Preferences:  Learning Barriers/Preferences - 10/14/22 1455       Learning Barriers/Preferences   Learning Barriers None    Learning Preferences Individual Instruction             General Pulmonary Education Topics:  Infection Prevention: - Provides verbal and written material to individual with discussion of infection control including proper hand washing and proper equipment cleaning during exercise session. Flowsheet Row Pulmonary Rehab from 10/20/2022 in [facility] Cardiac and Pulmonary Rehab  Education need identified 10/20/22  Date 10/20/22  Educator KW  Instruction Review Code 1- Verbalizes Understanding       Falls Prevention: -  Provides verbal and written material to individual with discussion of falls prevention and safety. Flowsheet Row Pulmonary Rehab from 10/20/2022 in [facility] Cardiac and Pulmonary Rehab  Education need identified 10/20/22  Date 10/20/22  Educator KW   Instruction Review Code 1- Verbalizes Understanding       Chronic Lung Disease Review: - Group verbal instruction with posters, models, PowerPoint presentations and videos,  to review new updates, new respiratory medications, new advancements in procedures and treatments. Providing information on websites and "800" numbers for continued self-education. Includes information about supplement oxygen, available portable oxygen systems, continuous and intermittent flow rates, oxygen safety, concentrators, and Medicare reimbursement for oxygen. Explanation of Pulmonary Drugs, including class, frequency, complications, importance of spacers, rinsing mouth after steroid MDI's, and proper cleaning methods for nebulizers. Review of basic lung anatomy and physiology related to function, structure, and complications of lung disease. Review of risk factors. Discussion about methods for diagnosing sleep apnea and types of masks and machines for OSA. Includes a review of the use of types of environmental controls: home humidity, furnaces, filters, dust mite/pet prevention, HEPA vacuums. Discussion about weather changes, air quality and the benefits of nasal washing. Instruction on Warning signs, infection symptoms, calling MD promptly, preventive modes, and value of vaccinations. Review of effective airway clearance, coughing and/or vibration techniques. Emphasizing that all should Create an Action Plan. Written material given at graduation. Flowsheet Row Pulmonary Rehab from 10/20/2022 in [facility] Cardiac and Pulmonary Rehab  Education need identified 10/20/22       AED/CPR: - Group verbal and written instruction with the use of models to demonstrate the basic use of the AED with the basic ABC's of resuscitation.    Anatomy and Cardiac Procedures: - Group verbal and visual presentation and models provide information about basic cardiac anatomy and function. Reviews the testing methods done to diagnose heart  disease and the outcomes of the test results. Describes the treatment choices: Medical Management, Angioplasty, or Coronary Bypass Surgery for treating various heart conditions including Myocardial Infarction, Angina, Valve Disease, and Cardiac Arrhythmias.  Written material given at graduation.   Medication Safety: - Group verbal and visual instruction to review commonly prescribed medications for heart and lung disease. Reviews the medication, class of the drug, and side effects. Includes the steps to properly store meds and maintain the prescription regimen.  Written material given at graduation.   Other: -Provides group and verbal instruction on various topics (see comments)   Knowledge Questionnaire Score:  Knowledge Questionnaire Score - 10/20/22 1633       Knowledge Questionnaire Score   Pre Score 16/18              Core Components/Risk Factors/Patient Goals at Admission:  Personal Goals and Risk Factors at Admission - 10/20/22 1659       Core Components/Risk Factors/Patient Goals on Admission    Weight Management Yes;Obesity;Weight Loss    Intervention Weight Management: Develop a combined nutrition and exercise program designed to reach desired caloric intake, while maintaining appropriate intake of nutrient and fiber, sodium and fats, and appropriate energy expenditure required for the weight goal.;Weight Management: Provide education and appropriate resources to help participant work on and attain dietary goals.;Weight Management/Obesity: Establish reasonable short term and long term weight goals.;Obesity: Provide education and appropriate resources to help participant work on and attain dietary goals.    Admit Weight 309 lb (140.2 kg)    Goal Weight: Short Term 303 lb (137.4 kg)    Goal Weight: Long Term 250  lb (113.4 kg)    Expected Outcomes Short Term: Continue to assess and modify interventions until short term weight is achieved;Long Term: Adherence to nutrition and  physical activity/exercise program aimed toward attainment of established weight goal;Weight Loss: Understanding of general recommendations for a balanced deficit meal plan, which promotes 1-2 lb weight loss per week and includes a negative energy balance of [phone] kcal/d;Understanding recommendations for meals to include 15-35% energy as protein, 25-35% energy from fat, 35-60% energy from carbohydrates, less than 200mg  of dietary cholesterol, 20-35 gm of total fiber daily;Understanding of distribution of calorie intake throughout the day with the consumption of 4-5 meals/snacks    Improve shortness of breath with ADL's Yes    Intervention Provide education, individualized exercise plan and daily activity instruction to help decrease symptoms of SOB with activities of daily living.    Expected Outcomes Short Term: Improve cardiorespiratory fitness to achieve a reduction of symptoms when performing ADLs;Long Term: Be able to perform more ADLs without symptoms or delay the onset of symptoms    Hypertension Yes   Hx of hypotension   Intervention Provide education on lifestyle modifcations including regular physical activity/exercise, weight management, moderate sodium restriction and increased consumption of fresh fruit, vegetables, and low fat dairy, alcohol moderation, and smoking cessation.;Monitor prescription use compliance.    Expected Outcomes Short Term: Continued assessment and intervention until BP is < 140/55mm HG in hypertensive participants. < 130/35mm HG in hypertensive participants with diabetes, heart failure or chronic kidney disease.;Long Term: Maintenance of blood pressure at goal levels.    Lipids Yes    Intervention Provide education and support for participant on nutrition & aerobic/resistive exercise along with prescribed medications to achieve LDL 70mg , HDL >40mg .    Expected Outcomes Short Term: Participant states understanding of desired cholesterol values and is compliant with  medications prescribed. Participant is following exercise prescription and nutrition guidelines.;Long Term: Cholesterol controlled with medications as prescribed, with individualized exercise RX and with personalized nutrition plan. Value goals: LDL < 70mg , HDL > 40 mg.             Education:Diabetes - Individual verbal and written instruction to review signs/symptoms of diabetes, desired ranges of glucose level fasting, after meals and with exercise. Acknowledge that pre and post exercise glucose checks will be done for 3 sessions at entry of program.   Know Your Numbers and Heart Failure: - Group verbal and visual instruction to discuss disease risk factors for cardiac and pulmonary disease and treatment options.  Reviews associated critical values for Overweight/Obesity, Hypertension, Cholesterol, and Diabetes.  Discusses basics of heart failure: signs/symptoms and treatments.  Introduces Heart Failure Zone chart for action plan for heart failure.  Written material given at graduation.   Core Components/Risk Factors/Patient Goals Review:    Core Components/Risk Factors/Patient Goals at Discharge (Final Review):    ITP Comments:  ITP Comments     Row Name 10/14/22 1449 10/20/22 1631 11/01/22 1652 11/08/22 1509 11/10/22 0930   ITP Comments Initial phone call completed. Diagnosis can be found in [facility] 2/15. EP Orientation scheduled for Wednesday 2/28 at 2:30. Completed 6MWT and gym orientation. Initial ITP created and sent for review to Dr. Ottie Glazier, Medical Director. Spoke with daughter and patient to refer him back to PT as he would benefit from doing that first. Patient would not be appropriate for [facility] at this time as patient would benefit more from 1 on 1 therapy. Let him know once he finishes PT he can get a  new referral back to rehab as long as he is deemed appopriate. Will request PT referral from Dr. Sabra Heck at this time. Called patient back to inform him we sent request  over to Dr. Sabra Heck. 30 Day review completed. Medical Director ITP review done, changes made as directed, and signed approval by Medical Director.  new to program            Comments:

## 2022-11-22 ENCOUNTER — Telehealth: Payer: Self-pay

## 2022-11-22 NOTE — Progress Notes (Signed)
Patient arrived for 1 session of Pulmonary Rehab, was recommended by staff to finish out PT then return to rehab once finished to get best benefit from program. Sent message to Dr. Sabra Heck, waiting on PT referral to be sent.   Any thoughts on this Dr. Sabra Heck? Patient is awaiting PT referral before resuming pulmonary rehab with [place].   Thank you!  Jilda Roche., CEP [phone]

## 2022-11-22 NOTE — Telephone Encounter (Signed)
Attempted to follow up with patient on PT referral, left voicemail asking for callback

## 2022-11-29 NOTE — Telephone Encounter (Signed)
Attempted to call patient again to follow up. Left voicemail. Sent message to Dr. Hyacinth Meeker x3, and routed note to ask on PT referral.

## 2022-12-08 ENCOUNTER — Encounter: Payer: Self-pay | Admitting: *Deleted

## 2022-12-08 DIAGNOSIS — R0609 Other forms of dyspnea: Secondary | ICD-10-CM

## 2022-12-08 NOTE — Progress Notes (Signed)
Pulmonary Individual Treatment Plan  Patient Details  Name: Christopher [name] MRN: [id number] Date of Birth: September 03, 1950 Referring Provider:   Flowsheet Row Pulmonary Rehab from 10/20/2022 in [facility] Cardiac and Pulmonary Rehab  Referring Provider Ottie Glazier MD       Initial Encounter Date:  Flowsheet Row Pulmonary Rehab from 10/20/2022 in [facility] Cardiac and Pulmonary Rehab  Date 10/20/22       Visit Diagnosis: Dyspnea on exertion  Patient's Home Medications on Admission:  Current Outpatient Medications:    acetaminophen (TYLENOL) 325 MG tablet, Take 2 tablets (650 mg total) by mouth every 6 (six) hours as needed for mild pain, fever, headache or moderate pain. (Patient not taking: Reported on 10/14/2022), Disp: , Rfl:    aspirin 81 MG EC tablet, Take 81 mg by mouth at bedtime., Disp: , Rfl:    atorvastatin (LIPITOR) 20 MG tablet, Take 20 mg by mouth at bedtime., Disp: , Rfl:    buPROPion (WELLBUTRIN SR) 150 MG 12 hr tablet, Take 150 mg by mouth 2 (two) times daily. After 1st meal of day and after dinner, Disp: , Rfl:    Cholecalciferol 25 MCG (1000 UT) capsule, Take 1,000 Units by mouth at bedtime., Disp: , Rfl:    cyanocobalamin 1000 MCG tablet, Take 1,000 mcg by mouth 2 (two) times a week. At bedtime. Sundays and Wednesdays, Disp: , Rfl:    FLOVENT HFA 110 MCG/ACT inhaler, Inhale 1 puff into the lungs 2 (two) times daily., Disp: , Rfl:    hydrocortisone cream 1 %, Apply topically as needed for itching. (Patient not taking: Reported on 10/14/2022), Disp: 30 g, Rfl: 0   ipratropium (ATROVENT) 0.06 % nasal spray, Place 2 sprays into both nostrils 3 (three) times daily as needed for rhinitis., Disp: , Rfl:    levalbuterol (XOPENEX HFA) 45 MCG/ACT inhaler, Inhale 2 puffs into the lungs every 4 (four) hours as needed for wheezing., Disp: , Rfl:    levalbuterol (XOPENEX) 0.63 MG/3ML nebulizer solution, Take 3 mLs (0.63 mg total) by nebulization every 6 (six) hours as needed for wheezing or  shortness of breath., Disp: 3 mL, Rfl: 12   levothyroxine (SYNTHROID) 88 MCG tablet, Take 88 mcg by mouth daily. 30 to 60 minutes before breakfast on an empty stomach and with a glass of water, Disp: , Rfl:    loratadine (CLARITIN) 10 MG tablet, Take 10 mg by mouth daily., Disp: , Rfl:    Magnesium 250 MG TABS, Take 1 tablet by mouth daily., Disp: , Rfl:    midodrine (PROAMATINE) 10 MG tablet, Take 1 tablet (10 mg total) by mouth 3 (three) times daily with meals., Disp: 90 tablet, Rfl: 0   Multiple Vitamins-Minerals (PRESERVISION AREDS 2) CAPS, Take 1 capsule by mouth 2 (two) times daily. With food. After 1st meal of day and after dinner, Disp: , Rfl:    OXYGEN, Inhale 2 L into the lungs continuous., Disp: , Rfl:    potassium chloride SA (KLOR-CON) 20 MEQ tablet, Take 1 tablet (20 mEq total) by mouth daily., Disp: 30 tablet, Rfl: 0   sertraline (ZOLOFT) 50 MG tablet, Take 50 mg by mouth daily., Disp: , Rfl:    torsemide (DEMADEX) 20 MG tablet, Take 1 tablet (20 mg total) by mouth daily., Disp: , Rfl:    traMADol (ULTRAM) 50 MG tablet, Take 50 mg by mouth every 12 (twelve) hours as needed for moderate pain., Disp: , Rfl:    trolamine salicylate (ASPERCREME) 10 % cream, Apply 1 application topically  as needed for muscle pain. Apply to knees and feet, Disp: , Rfl:    Vitamins A & D (VITAMIN A & D) ointment, Apply 1 application topically as needed. To groin area., Disp: , Rfl:   Past Medical History: Past Medical History:  Diagnosis Date   Anxiety    CHF (congestive heart failure) (HCC)    COPD (chronic obstructive pulmonary disease) ([place])    Depression    Dysrhythmia    Hyperlipidemia    Hypertension    Hypothyroidism    Sleep apnea     Tobacco Use: Social History   Tobacco Use  Smoking Status Never  Smokeless Tobacco Never    Labs: Review Flowsheet  More data exists      Latest Ref Rng & Units 03/22/2021 03/23/2021 04/20/2021 04/21/2021 05/20/2021  Labs for ITP Cardiac and Pulmonary  Rehab  Cholestrol 0 - 200 mg/dL - 88  - - -  LDL (calc) 0 - 99 mg/dL - 46  - - -  HDL-C >40 mg/dL - 26  - - -  Trlycerides <150 mg/dL - 78  - - -  PH, Arterial 7.350 - 7.450 - - 7.33  7.32  7.33  7.50   PCO2 arterial 32.0 - 48.0 mmHg - - 80  82  80  42   Bicarbonate 20.0 - 28.0 mmol/L 49.1  - 42.2  42.2  42.2  32.5  36.0   O2 Saturation % 58.7  - 93.6  95.8  94.3  98.5  45.7      Pulmonary Assessment Scores:  Pulmonary Assessment Scores     Row Name 10/20/22 1635         ADL UCSD   ADL Phase Entry     SOB Score total 52     Rest 0     Walk 2     Stairs 4     Bath 3     Dress 1     Shop 3       CAT Score   CAT Score 18       mMRC Score   mMRC Score 2              UCSD: Self-administered rating of dyspnea associated with activities of daily living (ADLs) 6-point scale (0 = "not at all" to 5 = "maximal or unable to do because of breathlessness")  Scoring Scores range from 0 to 120.  Minimally important difference is 5 units  CAT: CAT can identify the health impairment of COPD patients and is better correlated with disease progression.  CAT has a scoring range of zero to 40. The CAT score is classified into four groups of low (less than 10), medium (10 - 20), high (21-30) and very high (31-40) based on the impact level of disease on health status. A CAT score over 10 suggests significant symptoms.  A worsening CAT score could be explained by an exacerbation, poor medication adherence, poor inhaler technique, or progression of COPD or comorbid conditions.  CAT MCID is 2 points  mMRC: mMRC (Modified Medical Research Council) Dyspnea Scale is used to assess the degree of baseline functional disability in patients of respiratory disease due to dyspnea. No minimal important difference is established. A decrease in score of 1 point or greater is considered a positive change.   Pulmonary Function Assessment:   Exercise Target Goals: Exercise Program Goal: Individual  exercise prescription set using results from initial 6 min walk test and THRR while considering  patient's activity barriers and safety.   Exercise Prescription Goal: Initial exercise prescription builds to 30-45 minutes a day of aerobic activity, 2-3 days per week.  Home exercise guidelines will be given to patient during program as part of exercise prescription that the participant will acknowledge.  Education: Aerobic Exercise: - Group verbal and visual presentation on the components of exercise prescription. Introduces F.I.T.T principle from ACSM for exercise prescriptions.  Reviews F.I.T.T. principles of aerobic exercise including progression. Written material given at graduation.   Education: Resistance Exercise: - Group verbal and visual presentation on the components of exercise prescription. Introduces F.I.T.T principle from ACSM for exercise prescriptions  Reviews F.I.T.T. principles of resistance exercise including progression. Written material given at graduation.    Education: Exercise & Equipment Safety: - Individual verbal instruction and demonstration of equipment use and safety with use of the equipment. Flowsheet Row Pulmonary Rehab from 10/20/2022 in [facility] Cardiac and Pulmonary Rehab  Education need identified 10/20/22  Date 10/20/22  Educator KW  Instruction Review Code 1- Verbalizes Understanding       Education: Exercise Physiology & General Exercise Guidelines: - Group verbal and written instruction with models to review the exercise physiology of the cardiovascular system and associated critical values. Provides general exercise guidelines with specific guidelines to those with heart or lung disease.    Education: Flexibility, Balance, Mind/Body Relaxation: - Group verbal and visual presentation with interactive activity on the components of exercise prescription. Introduces F.I.T.T principle from ACSM for exercise prescriptions. Reviews F.I.T.T. principles of  flexibility and balance exercise training including progression. Also discusses the mind body connection.  Reviews various relaxation techniques to help reduce and manage stress (i.e. Deep breathing, progressive muscle relaxation, and visualization). Balance handout provided to take home. Written material given at graduation.   Activity Barriers & Risk Stratification:  Activity Barriers & Cardiac Risk Stratification - 10/20/22 1645       Activity Barriers & Cardiac Risk Stratification   Activity Barriers Joint Problems;Balance Concerns;Shortness of Breath;Assistive Device;Deconditioning;Muscular Weakness;Arthritis;History of Falls    Comments Left Ankle- plates/screws in 2022 after fall (turns out), right knee pain             6 Minute Walk:  6 Minute Walk     Row Name 10/20/22 1646         6 Minute Walk   Phase Initial     Distance 125 feet     Walk Time 2.06 minutes     # of Rest Breaks 3  1:17-1:45; 3:30-4:56; stopped at 5:40     MPH 0.68     METS 0.06     RPE 13     Perceived Dyspnea  3     VO2 Peak 0.22     Symptoms Yes (comment)     Comments SOB, fatigue, right knee pain 7/10, shuffled feet/unbalanced gait     Resting HR 88 bpm     Resting BP 104/66     Resting Oxygen Saturation  93 %     Exercise Oxygen Saturation  during 6 min walk 91 %     Max Ex. HR 125 bpm     Max Ex. BP 142/68     2 Minute Post BP 110/62       Interval HR   1 Minute HR 125     2 Minute HR 109     3 Minute HR 105     4 Minute HR 104     5 Minute HR 108  6 Minute HR 120  stopped at 5:40     2 Minute Post HR 96     Interval Heart Rate? Yes       Interval Oxygen   Interval Oxygen? Yes     Baseline Oxygen Saturation % 93 %     1 Minute Oxygen Saturation % 91 %     1 Minute Liters of Oxygen 0 L  RA     2 Minute Oxygen Saturation % 96 %     2 Minute Liters of Oxygen 0 L     3 Minute Oxygen Saturation % 96 %     3 Minute Liters of Oxygen 0 L     4 Minute Oxygen Saturation % 95 %      4 Minute Liters of Oxygen 0 L     5 Minute Oxygen Saturation % 96 %     5 Minute Liters of Oxygen 0 L     6 Minute Oxygen Saturation % 97 %     6 Minute Liters of Oxygen 0 L     2 Minute Post Oxygen Saturation % 98 %     2 Minute Post Liters of Oxygen 0 L             Oxygen Initial Assessment:  Oxygen Initial Assessment - 10/20/22 1635       Home Oxygen   Home Oxygen Device E-Tanks    Sleep Oxygen Prescription Continuous    Liters per minute 2    Home Exercise Oxygen Prescription None   2L prn- hasn't used in a while   Home Resting Oxygen Prescription None   2L prn- hasn't used in a while   Compliance with Home Oxygen Use Yes      Initial 6 min Walk   Oxygen Used None      Program Oxygen Prescription   Program Oxygen Prescription None   Can use 2L prn     Intervention   Short Term Goals To learn and understand importance of monitoring SPO2 with pulse oximeter and demonstrate accurate use of the pulse oximeter.;To learn and understand importance of maintaining oxygen saturations>88%;To learn and demonstrate proper pursed lip breathing techniques or other breathing techniques. ;To learn and demonstrate proper use of respiratory medications;To learn and exhibit compliance with exercise, home and travel O2 prescription    Long  Term Goals Verbalizes importance of monitoring SPO2 with pulse oximeter and return demonstration;Maintenance of O2 saturations>88%;Exhibits proper breathing techniques, such as pursed lip breathing or other method taught during program session;Compliance with respiratory medication;Exhibits compliance with exercise, home  and travel O2 prescription             Oxygen Re-Evaluation:   Oxygen Discharge (Final Oxygen Re-Evaluation):   Initial Exercise Prescription:  Initial Exercise Prescription - 10/20/22 1600       Date of Initial Exercise RX and Referring Provider   Date 10/20/22    Referring Provider Ottie Glazier MD      Oxygen    Oxygen Continuous    Liters 2L prn    Maintain Oxygen Saturation 88% or higher      NuStep   Level 1    SPM 80   start with 60   Minutes 15    METs 1      T5 Nustep   Level 1    SPM 80   start with 60   Minutes 15    METs 1      Biostep-RELP  Level 1    SPM 50   start with 30   Minutes 15    METs 1      Track   Laps 3   breaks as needed   Minutes 15    METs 1      Prescription Details   Frequency (times per week) 2    Duration Progress to 30 minutes of continuous aerobic without signs/symptoms of physical distress      Intensity   THRR 40-80% of Max Heartrate 112- 136    Ratings of Perceived Exertion 11-13    Perceived Dyspnea 0-4      Progression   Progression Continue to progress workloads to maintain intensity without signs/symptoms of physical distress.      Resistance Training   Training Prescription Yes    Weight 3 lb    Reps 10-15             Perform Capillary Blood Glucose checks as needed.  Exercise Prescription Changes:   Exercise Prescription Changes     Row Name 10/20/22 1600             Response to Exercise   Blood Pressure (Admit) 104/66       Blood Pressure (Exercise) 142/68       Blood Pressure (Exit) 110/62       Heart Rate (Admit) 88 bpm       Heart Rate (Exercise) 125 bpm       Heart Rate (Exit) 96 bpm       Oxygen Saturation (Admit) 93 %       Oxygen Saturation (Exercise) 91 %       Oxygen Saturation (Exit) 98 %       Rating of Perceived Exertion (Exercise) 13       Perceived Dyspnea (Exercise) 3       Symptoms SOB, fatigue, shuffled feet, right knee pain 7/10       Comments walk test results                Exercise Comments:   Exercise Goals and Review:   Exercise Goals     Row Name 10/20/22 1659             Exercise Goals   Increase Physical Activity Yes       Intervention Provide advice, education, support and counseling about physical activity/exercise needs.;Develop an individualized exercise  prescription for aerobic and resistive training based on initial evaluation findings, risk stratification, comorbidities and participant's personal goals.       Expected Outcomes Short Term: Attend rehab on a regular basis to increase amount of physical activity.;Long Term: Add in home exercise to make exercise part of routine and to increase amount of physical activity.;Long Term: Exercising regularly at least 3-5 days a week.       Increase Strength and Stamina Yes       Intervention Provide advice, education, support and counseling about physical activity/exercise needs.;Develop an individualized exercise prescription for aerobic and resistive training based on initial evaluation findings, risk stratification, comorbidities and participant's personal goals.       Expected Outcomes Short Term: Increase workloads from initial exercise prescription for resistance, speed, and METs.;Short Term: Perform resistance training exercises routinely during rehab and add in resistance training at home;Long Term: Improve cardiorespiratory fitness, muscular endurance and strength as measured by increased METs and functional capacity (6MWT)       Able to understand and use rate of perceived exertion (RPE) scale  Yes       Intervention Provide education and explanation on how to use RPE scale       Expected Outcomes Short Term: Able to use RPE daily in rehab to express subjective intensity level;Long Term:  Able to use RPE to guide intensity level when exercising independently       Able to understand and use Dyspnea scale Yes       Intervention Provide education and explanation on how to use Dyspnea scale       Expected Outcomes Short Term: Able to use Dyspnea scale daily in rehab to express subjective sense of shortness of breath during exertion;Long Term: Able to use Dyspnea scale to guide intensity level when exercising independently       Knowledge and understanding of Target Heart Rate Range (THRR) Yes        Intervention Provide education and explanation of THRR including how the numbers were predicted and where they are located for reference       Expected Outcomes Long Term: Able to use THRR to govern intensity when exercising independently;Short Term: Able to use daily as guideline for intensity in rehab;Short Term: Able to state/look up THRR       Able to check pulse independently Yes       Intervention Provide education and demonstration on how to check pulse in carotid and radial arteries.;Review the importance of being able to check your own pulse for safety during independent exercise       Expected Outcomes Long Term: Able to check pulse independently and accurately;Short Term: Able to explain why pulse checking is important during independent exercise       Understanding of Exercise Prescription Yes       Intervention Provide education, explanation, and written materials on patient's individual exercise prescription       Expected Outcomes Short Term: Able to explain program exercise prescription;Long Term: Able to explain home exercise prescription to exercise independently                Exercise Goals Re-Evaluation :   Discharge Exercise Prescription (Final Exercise Prescription Changes):  Exercise Prescription Changes - 10/20/22 1600       Response to Exercise   Blood Pressure (Admit) 104/66    Blood Pressure (Exercise) 142/68    Blood Pressure (Exit) 110/62    Heart Rate (Admit) 88 bpm    Heart Rate (Exercise) 125 bpm    Heart Rate (Exit) 96 bpm    Oxygen Saturation (Admit) 93 %    Oxygen Saturation (Exercise) 91 %    Oxygen Saturation (Exit) 98 %    Rating of Perceived Exertion (Exercise) 13    Perceived Dyspnea (Exercise) 3    Symptoms SOB, fatigue, shuffled feet, right knee pain 7/10    Comments walk test results             Nutrition:  Target Goals: Understanding of nutrition guidelines, daily intake of sodium '1500mg'$ , cholesterol '200mg'$ , calories 30% from  fat and 7% or less from saturated fats, daily to have 5 or more servings of fruits and vegetables.  Education: All About Nutrition: -Group instruction provided by verbal, written material, interactive activities, discussions, models, and posters to present general guidelines for heart healthy nutrition including fat, fiber, MyPlate, the role of sodium in heart healthy nutrition, utilization of the nutrition label, and utilization of this knowledge for meal planning. Follow up email sent as well. Written material given at graduation. Flowsheet Row Pulmonary Rehab  from 10/20/2022 in [facility] Cardiac and Pulmonary Rehab  Education need identified 10/20/22       Biometrics:  Pre Biometrics - 10/20/22 1641       Pre Biometrics   Height '5\' 10"'$  (1.778 m)   Per pt- did not complete at rehab   Weight 309 lb 14.4 oz (140.6 kg)    Waist Circumference 55.5 inches    Hip Circumference 56.5 inches    Waist to Hip Ratio 0.98 %    BMI (Calculated) 44.47    Single Leg Stand 0 seconds              Nutrition Therapy Plan and Nutrition Goals:  Nutrition Therapy & Goals - 10/20/22 1638       Intervention Plan   Intervention Prescribe, educate and counsel regarding individualized specific dietary modifications aiming towards targeted core components such as weight, hypertension, lipid management, diabetes, heart failure and other comorbidities.    Expected Outcomes Short Term Goal: Understand basic principles of dietary content, such as calories, fat, sodium, cholesterol and nutrients.;Short Term Goal: A plan has been developed with personal nutrition goals set during dietitian appointment.;Long Term Goal: Adherence to prescribed nutrition plan.             Nutrition Assessments:  MEDIFICTS Score Key: ?70 Need to make dietary changes  40-70 Heart Healthy Diet ? 40 Therapeutic Level Cholesterol Diet  Flowsheet Row Pulmonary Rehab from 10/20/2022 in [facility] Cardiac and Pulmonary Rehab  Picture  Your Plate Total Score on Admission 39      Picture Your Plate Scores: [id number] Unhealthy dietary pattern with much room for improvement. 41-50 Dietary pattern unlikely to meet recommendations for good health and room for improvement. 51-60 More healthful dietary pattern, with some room for improvement.  >60 Healthy dietary pattern, although there may be some specific behaviors that could be improved.   Nutrition Goals Re-Evaluation:   Nutrition Goals Discharge (Final Nutrition Goals Re-Evaluation):   Psychosocial: Target Goals: Acknowledge presence or absence of significant depression and/or stress, maximize coping skills, provide positive support system. Participant is able to verbalize types and ability to use techniques and skills needed for reducing stress and depression.   Education: Stress, Anxiety, and Depression - Group verbal and visual presentation to define topics covered.  Reviews how body is impacted by stress, anxiety, and depression.  Also discusses healthy ways to reduce stress and to treat/manage anxiety and depression.  Written material given at graduation.   Education: Sleep Hygiene -Provides group verbal and written instruction about how sleep can affect your health.  Define sleep hygiene, discuss sleep cycles and impact of sleep habits. Review good sleep hygiene tips.    Initial Review & Psychosocial Screening:  Initial Psych Review & Screening - 10/14/22 1455       Initial Review   Current issues with Current Psychotropic Meds      Family Dynamics   Good Support System? Yes   daughter     Barriers   Psychosocial barriers to participate in program There are no identifiable barriers or psychosocial needs.;The patient should benefit from training in stress management and relaxation.      Screening Interventions   Interventions Encouraged to exercise    Expected Outcomes Short Term goal: Utilizing psychosocial counselor, staff and physician to assist with  identification of specific Stressors or current issues interfering with healing process. Setting desired goal for each stressor or current issue identified.;Long Term Goal: Stressors or current issues are controlled or eliminated.;Short Term  goal: Identification and review with participant of any Quality of Life or Depression concerns found by scoring the questionnaire.;Long Term goal: The participant improves quality of Life and PHQ9 Scores as seen by post scores and/or verbalization of changes             Quality of Life Scores:  Scores of 19 and below usually indicate a poorer quality of life in these areas.  A difference of  2-3 points is a clinically meaningful difference.  A difference of 2-3 points in the total score of the Quality of Life Index has been associated with significant improvement in overall quality of life, self-image, physical symptoms, and general health in studies assessing change in quality of life.  PHQ-9: Review Flowsheet       10/20/2022 08/11/2021 06/30/2021 05/15/2021  Depression screen PHQ 2/9  Decreased Interest 0 0 0 0  Down, Depressed, Hopeless 1 1 0 1  PHQ - 2 Score 1 1 0 1  Altered sleeping 2 - - -  Tired, decreased energy 1 - - -  Change in appetite 0 - - -  Feeling bad or failure about yourself  0 - - -  Trouble concentrating 0 - - -  Moving slowly or fidgety/restless 0 - - -  Suicidal thoughts 0 - - -  PHQ-9 Score 4 - - -  Difficult doing work/chores Not difficult at all - - -   Interpretation of Total Score  Total Score Depression Severity:  1-4 = Minimal depression, 5-9 = Mild depression, 10-14 = Moderate depression, 15-19 = Moderately severe depression, 20-27 = Severe depression   Psychosocial Evaluation and Intervention:  Psychosocial Evaluation - 10/14/22 1458       Psychosocial Evaluation & Interventions   Interventions Encouraged to exercise with the program and follow exercise prescription    Comments Beuford is coming to pulmonary  rehab for dyspnea for exertion. His daughter accompanies him to all his appointments to help him out. His breathing has worsened recently. He has a history of falls (s/p ankel surgery 2022) and uses a walker and wheelchair as needed. His daughter answered most of the questions for him. He did state that he does not have any stress concerns and feels like the Wellbutrin  and Zoloft is helping meet his needs. He wants to come to the program to regain some strength and learn more about breathing techniques.    Expected Outcomes Short: attend pulmonary rehab for education and exercise. Long: develop and maintain positive self care habits.    Continue Psychosocial Services  Follow up required by staff             Psychosocial Re-Evaluation:   Psychosocial Discharge (Final Psychosocial Re-Evaluation):   Education: Education Goals: Education classes will be provided on a weekly basis, covering required topics. Participant will state understanding/return demonstration of topics presented.  Learning Barriers/Preferences:  Learning Barriers/Preferences - 10/14/22 1455       Learning Barriers/Preferences   Learning Barriers None    Learning Preferences Individual Instruction             General Pulmonary Education Topics:  Infection Prevention: - Provides verbal and written material to individual with discussion of infection control including proper hand washing and proper equipment cleaning during exercise session. Flowsheet Row Pulmonary Rehab from 10/20/2022 in [facility] Cardiac and Pulmonary Rehab  Education need identified 10/20/22  Date 10/20/22  Educator KW  Instruction Review Code 1- Verbalizes Understanding       Falls Prevention: -  Provides verbal and written material to individual with discussion of falls prevention and safety. Flowsheet Row Pulmonary Rehab from 10/20/2022 in [facility] Cardiac and Pulmonary Rehab  Education need identified 10/20/22  Date 10/20/22  Educator KW   Instruction Review Code 1- Verbalizes Understanding       Chronic Lung Disease Review: - Group verbal instruction with posters, models, PowerPoint presentations and videos,  to review new updates, new respiratory medications, new advancements in procedures and treatments. Providing information on websites and "800" numbers for continued self-education. Includes information about supplement oxygen, available portable oxygen systems, continuous and intermittent flow rates, oxygen safety, concentrators, and Medicare reimbursement for oxygen. Explanation of Pulmonary Drugs, including class, frequency, complications, importance of spacers, rinsing mouth after steroid MDI's, and proper cleaning methods for nebulizers. Review of basic lung anatomy and physiology related to function, structure, and complications of lung disease. Review of risk factors. Discussion about methods for diagnosing sleep apnea and types of masks and machines for OSA. Includes a review of the use of types of environmental controls: home humidity, furnaces, filters, dust mite/pet prevention, HEPA vacuums. Discussion about weather changes, air quality and the benefits of nasal washing. Instruction on Warning signs, infection symptoms, calling MD promptly, preventive modes, and value of vaccinations. Review of effective airway clearance, coughing and/or vibration techniques. Emphasizing that all should Create an Action Plan. Written material given at graduation. Flowsheet Row Pulmonary Rehab from 10/20/2022 in [facility] Cardiac and Pulmonary Rehab  Education need identified 10/20/22       AED/CPR: - Group verbal and written instruction with the use of models to demonstrate the basic use of the AED with the basic ABC's of resuscitation.    Anatomy and Cardiac Procedures: - Group verbal and visual presentation and models provide information about basic cardiac anatomy and function. Reviews the testing methods done to diagnose heart  disease and the outcomes of the test results. Describes the treatment choices: Medical Management, Angioplasty, or Coronary Bypass Surgery for treating various heart conditions including Myocardial Infarction, Angina, Valve Disease, and Cardiac Arrhythmias.  Written material given at graduation.   Medication Safety: - Group verbal and visual instruction to review commonly prescribed medications for heart and lung disease. Reviews the medication, class of the drug, and side effects. Includes the steps to properly store meds and maintain the prescription regimen.  Written material given at graduation.   Other: -Provides group and verbal instruction on various topics (see comments)   Knowledge Questionnaire Score:  Knowledge Questionnaire Score - 10/20/22 1633       Knowledge Questionnaire Score   Pre Score 16/18              Core Components/Risk Factors/Patient Goals at Admission:  Personal Goals and Risk Factors at Admission - 10/20/22 1659       Core Components/Risk Factors/Patient Goals on Admission    Weight Management Yes;Obesity;Weight Loss    Intervention Weight Management: Develop a combined nutrition and exercise program designed to reach desired caloric intake, while maintaining appropriate intake of nutrient and fiber, sodium and fats, and appropriate energy expenditure required for the weight goal.;Weight Management: Provide education and appropriate resources to help participant work on and attain dietary goals.;Weight Management/Obesity: Establish reasonable short term and long term weight goals.;Obesity: Provide education and appropriate resources to help participant work on and attain dietary goals.    Admit Weight 309 lb (140.2 kg)    Goal Weight: Short Term 303 lb (137.4 kg)    Goal Weight: Long Term 250  lb (113.4 kg)    Expected Outcomes Short Term: Continue to assess and modify interventions until short term weight is achieved;Long Term: Adherence to nutrition and  physical activity/exercise program aimed toward attainment of established weight goal;Weight Loss: Understanding of general recommendations for a balanced deficit meal plan, which promotes 1-2 lb weight loss per week and includes a negative energy balance of [phone] kcal/d;Understanding recommendations for meals to include 15-35% energy as protein, 25-35% energy from fat, 35-60% energy from carbohydrates, less than 200mg  of dietary cholesterol, 20-35 gm of total fiber daily;Understanding of distribution of calorie intake throughout the day with the consumption of 4-5 meals/snacks    Improve shortness of breath with ADL's Yes    Intervention Provide education, individualized exercise plan and daily activity instruction to help decrease symptoms of SOB with activities of daily living.    Expected Outcomes Short Term: Improve cardiorespiratory fitness to achieve a reduction of symptoms when performing ADLs;Long Term: Be able to perform more ADLs without symptoms or delay the onset of symptoms    Hypertension Yes   Hx of hypotension   Intervention Provide education on lifestyle modifcations including regular physical activity/exercise, weight management, moderate sodium restriction and increased consumption of fresh fruit, vegetables, and low fat dairy, alcohol moderation, and smoking cessation.;Monitor prescription use compliance.    Expected Outcomes Short Term: Continued assessment and intervention until BP is < 140/55mm HG in hypertensive participants. < 130/35mm HG in hypertensive participants with diabetes, heart failure or chronic kidney disease.;Long Term: Maintenance of blood pressure at goal levels.    Lipids Yes    Intervention Provide education and support for participant on nutrition & aerobic/resistive exercise along with prescribed medications to achieve LDL 70mg , HDL >40mg .    Expected Outcomes Short Term: Participant states understanding of desired cholesterol values and is compliant with  medications prescribed. Participant is following exercise prescription and nutrition guidelines.;Long Term: Cholesterol controlled with medications as prescribed, with individualized exercise RX and with personalized nutrition plan. Value goals: LDL < 70mg , HDL > 40 mg.             Education:Diabetes - Individual verbal and written instruction to review signs/symptoms of diabetes, desired ranges of glucose level fasting, after meals and with exercise. Acknowledge that pre and post exercise glucose checks will be done for 3 sessions at entry of program.   Know Your Numbers and Heart Failure: - Group verbal and visual instruction to discuss disease risk factors for cardiac and pulmonary disease and treatment options.  Reviews associated critical values for Overweight/Obesity, Hypertension, Cholesterol, and Diabetes.  Discusses basics of heart failure: signs/symptoms and treatments.  Introduces Heart Failure Zone chart for action plan for heart failure.  Written material given at graduation.   Core Components/Risk Factors/Patient Goals Review:    Core Components/Risk Factors/Patient Goals at Discharge (Final Review):    ITP Comments:  ITP Comments     Row Name 10/14/22 1449 10/20/22 1631 11/01/22 1652 11/08/22 1509 11/10/22 0930   ITP Comments Initial phone call completed. Diagnosis can be found in [facility] 2/15. EP Orientation scheduled for Wednesday 2/28 at 2:30. Completed 6MWT and gym orientation. Initial ITP created and sent for review to Dr. Ottie Glazier, Medical Director. Spoke with daughter and patient to refer him back to PT as he would benefit from doing that first. Patient would not be appropriate for [facility] at this time as patient would benefit more from 1 on 1 therapy. Let him know once he finishes PT he can get a  new referral back to rehab as long as he is deemed appopriate. Will request PT referral from Dr. Hyacinth Meeker at this time. Called patient back to inform him we sent request  over to Dr. Hyacinth Meeker. 30 Day review completed. Medical Director ITP review done, changes made as directed, and signed approval by Medical Director.  new to program    Row Name 12/08/22 1502           ITP Comments Pt discharge to PT                Comments: discharge ITP, Only 2 sessions completed.

## 2022-12-09 NOTE — Telephone Encounter (Signed)
Dr. Rondel Baton office called to let us know the PT referral was sent. Called patient to inform him, left a message asking for callback.

## 2022-12-15 ENCOUNTER — Telehealth: Payer: Self-pay

## 2022-12-15 NOTE — Telephone Encounter (Signed)
Tresa Endo from Dr. Rondel Baton office called to let us know he sent over a referral for PT. Called and spoke with patient to provide him with an update and let him know.

## 2023-01-19 ENCOUNTER — Emergency Department
Admission: EM | Admit: 2023-01-19 | Discharge: 2023-01-19 | Disposition: A | Discharge status: Home / Self Care | Payer: Medicare Other | Attending: Student in an Organized Health Care Education/Training Program | Admitting: Student in an Organized Health Care Education/Training Program

## 2023-01-19 ENCOUNTER — Other Ambulatory Visit: Payer: Self-pay

## 2023-01-19 ENCOUNTER — Encounter: Payer: Self-pay | Admitting: Emergency Medicine

## 2023-01-19 ENCOUNTER — Emergency Department: Payer: Medicare Other

## 2023-01-19 ENCOUNTER — Encounter (HOSPITAL_COMMUNITY): Payer: Self-pay

## 2023-01-19 ENCOUNTER — Encounter (HOSPITAL_COMMUNITY): Payer: Self-pay | Admitting: Internal Medicine

## 2023-01-19 ENCOUNTER — Inpatient Hospital Stay (HOSPITAL_COMMUNITY)
Admission: RE | Admit: 2023-01-19 | Discharge: 2023-01-25 | DRG: 480 | Disposition: A | Discharge status: Skilled Nursing Facility | Payer: Medicare Other | Source: Other Acute Inpatient Hospital | Attending: Internal Medicine | Admitting: Internal Medicine

## 2023-01-19 ENCOUNTER — Encounter (HOSPITAL_COMMUNITY): Payer: Self-pay | Admitting: Student

## 2023-01-19 DIAGNOSIS — Z9981 Dependence on supplemental oxygen: Secondary | ICD-10-CM

## 2023-01-19 DIAGNOSIS — S79922A Unspecified injury of left thigh, initial encounter: Secondary | ICD-10-CM | POA: Diagnosis present

## 2023-01-19 DIAGNOSIS — Z7982 Long term (current) use of aspirin: Secondary | ICD-10-CM | POA: Diagnosis not present

## 2023-01-19 DIAGNOSIS — E785 Hyperlipidemia, unspecified: Secondary | ICD-10-CM | POA: Diagnosis present

## 2023-01-19 DIAGNOSIS — S72402A Unspecified fracture of lower end of left femur, initial encounter for closed fracture: Secondary | ICD-10-CM | POA: Insufficient documentation

## 2023-01-19 DIAGNOSIS — I130 Hypertensive heart and chronic kidney disease with heart failure and stage 1 through stage 4 chronic kidney disease, or unspecified chronic kidney disease: Secondary | ICD-10-CM | POA: Diagnosis present

## 2023-01-19 DIAGNOSIS — Z79890 Hormone replacement therapy (postmenopausal): Secondary | ICD-10-CM | POA: Diagnosis not present

## 2023-01-19 DIAGNOSIS — I509 Heart failure, unspecified: Secondary | ICD-10-CM | POA: Insufficient documentation

## 2023-01-19 DIAGNOSIS — I5033 Acute on chronic diastolic (congestive) heart failure: Secondary | ICD-10-CM | POA: Diagnosis present

## 2023-01-19 DIAGNOSIS — M1710 Unilateral primary osteoarthritis, unspecified knee: Secondary | ICD-10-CM | POA: Diagnosis present

## 2023-01-19 DIAGNOSIS — N1832 Chronic kidney disease, stage 3b: Secondary | ICD-10-CM | POA: Diagnosis present

## 2023-01-19 DIAGNOSIS — Z79899 Other long term (current) drug therapy: Secondary | ICD-10-CM

## 2023-01-19 DIAGNOSIS — I10 Essential (primary) hypertension: Secondary | ICD-10-CM | POA: Diagnosis not present

## 2023-01-19 DIAGNOSIS — S7222XA Displaced subtrochanteric fracture of left femur, initial encounter for closed fracture: Principal | ICD-10-CM

## 2023-01-19 DIAGNOSIS — I480 Paroxysmal atrial fibrillation: Secondary | ICD-10-CM | POA: Diagnosis present

## 2023-01-19 DIAGNOSIS — E8809 Other disorders of plasma-protein metabolism, not elsewhere classified: Secondary | ICD-10-CM | POA: Diagnosis present

## 2023-01-19 DIAGNOSIS — F32A Depression, unspecified: Secondary | ICD-10-CM | POA: Diagnosis present

## 2023-01-19 DIAGNOSIS — Z888 Allergy status to other drugs, medicaments and biological substances status: Secondary | ICD-10-CM

## 2023-01-19 DIAGNOSIS — D72829 Elevated white blood cell count, unspecified: Secondary | ICD-10-CM | POA: Diagnosis not present

## 2023-01-19 DIAGNOSIS — E039 Hypothyroidism, unspecified: Secondary | ICD-10-CM | POA: Diagnosis present

## 2023-01-19 DIAGNOSIS — Z993 Dependence on wheelchair: Secondary | ICD-10-CM | POA: Diagnosis not present

## 2023-01-19 DIAGNOSIS — S72492A Other fracture of lower end of left femur, initial encounter for closed fracture: Secondary | ICD-10-CM

## 2023-01-19 DIAGNOSIS — Y92002 Bathroom of unspecified non-institutional (private) residence single-family (private) house as the place of occurrence of the external cause: Secondary | ICD-10-CM | POA: Insufficient documentation

## 2023-01-19 DIAGNOSIS — K5900 Constipation, unspecified: Secondary | ICD-10-CM | POA: Diagnosis present

## 2023-01-19 DIAGNOSIS — Z7951 Long term (current) use of inhaled steroids: Secondary | ICD-10-CM

## 2023-01-19 DIAGNOSIS — F419 Anxiety disorder, unspecified: Secondary | ICD-10-CM | POA: Diagnosis present

## 2023-01-19 DIAGNOSIS — Z6841 Body Mass Index (BMI) 40.0 and over, adult: Secondary | ICD-10-CM | POA: Diagnosis not present

## 2023-01-19 DIAGNOSIS — J449 Chronic obstructive pulmonary disease, unspecified: Secondary | ICD-10-CM | POA: Diagnosis present

## 2023-01-19 DIAGNOSIS — W182XXA Fall in (into) shower or empty bathtub, initial encounter: Secondary | ICD-10-CM | POA: Insufficient documentation

## 2023-01-19 DIAGNOSIS — N183 Chronic kidney disease, stage 3 unspecified: Secondary | ICD-10-CM | POA: Diagnosis not present

## 2023-01-19 DIAGNOSIS — I5032 Chronic diastolic (congestive) heart failure: Secondary | ICD-10-CM | POA: Diagnosis not present

## 2023-01-19 DIAGNOSIS — Y92009 Unspecified place in unspecified non-institutional (private) residence as the place of occurrence of the external cause: Secondary | ICD-10-CM

## 2023-01-19 DIAGNOSIS — S72452A Displaced supracondylar fracture without intracondylar extension of lower end of left femur, initial encounter for closed fracture: Secondary | ICD-10-CM | POA: Diagnosis not present

## 2023-01-19 DIAGNOSIS — G4730 Sleep apnea, unspecified: Secondary | ICD-10-CM | POA: Diagnosis present

## 2023-01-19 DIAGNOSIS — D62 Acute posthemorrhagic anemia: Secondary | ICD-10-CM | POA: Diagnosis not present

## 2023-01-19 DIAGNOSIS — Z8249 Family history of ischemic heart disease and other diseases of the circulatory system: Secondary | ICD-10-CM

## 2023-01-19 DIAGNOSIS — Z713 Dietary counseling and surveillance: Secondary | ICD-10-CM

## 2023-01-19 LAB — COMPREHENSIVE METABOLIC PANEL
ALT: 11 U/L (ref 0–44)
AST: 19 U/L (ref 15–41)
Albumin: 3.9 g/dL (ref 3.5–5.0)
Alkaline Phosphatase: 55 U/L (ref 38–126)
Anion gap: 9 (ref 5–15)
BUN: 57 mg/dL — ABNORMAL HIGH (ref 8–23)
CO2: 28 mmol/L (ref 22–32)
Calcium: 9.3 mg/dL (ref 8.9–10.3)
Chloride: 103 mmol/L (ref 98–111)
Creatinine, Ser: 1.75 mg/dL — ABNORMAL HIGH (ref 0.61–1.24)
GFR, Estimated: 41 mL/min — ABNORMAL LOW (ref >60)
Glucose, Bld: 110 mg/dL — ABNORMAL HIGH (ref 70–99)
Potassium: 5 mmol/L (ref 3.5–5.1)
Sodium: 140 mmol/L (ref 135–145)
Total Bilirubin: 0.6 mg/dL (ref 0.3–1.2)
Total Protein: 6.8 g/dL (ref 6.5–8.1)

## 2023-01-19 LAB — CBC WITH DIFFERENTIAL/PLATELET
Abs Immature Granulocytes: 0.33 10*3/uL — ABNORMAL HIGH (ref 0.00–0.07)
Basophils Absolute: 0.1 10*3/uL (ref 0.0–0.1)
Basophils Relative: 1 %
Eosinophils Absolute: 0.1 10*3/uL (ref 0.0–0.5)
Eosinophils Relative: 0 %
HCT: 44.2 % (ref 39.0–52.0)
Hemoglobin: 13.4 g/dL (ref 13.0–17.0)
Immature Granulocytes: 2 %
Lymphocytes Relative: 5 %
Lymphs Abs: 0.9 10*3/uL (ref 0.7–4.0)
MCH: 26 pg (ref 26.0–34.0)
MCHC: 30.3 g/dL (ref 30.0–36.0)
MCV: 85.7 fL (ref 80.0–100.0)
Monocytes Absolute: 1.3 10*3/uL — ABNORMAL HIGH (ref 0.1–1.0)
Monocytes Relative: 7 %
Neutro Abs: 17.2 10*3/uL — ABNORMAL HIGH (ref 1.7–7.7)
Neutrophils Relative %: 85 %
Platelets: 258 10*3/uL (ref 150–400)
RBC: 5.16 MIL/uL (ref 4.22–5.81)
RDW: 15.1 % (ref 11.5–15.5)
WBC: 19.9 10*3/uL — ABNORMAL HIGH (ref 4.0–10.5)
nRBC: 0 % (ref 0.0–0.2)

## 2023-01-19 LAB — SURGICAL PCR SCREEN
MRSA, PCR: NEGATIVE
Staphylococcus aureus: NEGATIVE

## 2023-01-19 LAB — SAMPLE TO BLOOD BANK

## 2023-01-19 LAB — BRAIN NATRIURETIC PEPTIDE: B Natriuretic Peptide: 72.1 pg/mL (ref 0.0–100.0)

## 2023-01-19 MED ORDER — MORPHINE SULFATE (PF) 4 MG/ML IV SOLN
4.0000 mg | INTRAVENOUS | Status: DC | PRN
Start: 2023-01-19 — End: 2023-01-19
  Administered 2023-01-19: 4 mg via INTRAVENOUS
  Filled 2023-01-19: qty 1

## 2023-01-19 MED ORDER — ASPIRIN 81 MG PO TBEC
81.0000 mg | DELAYED_RELEASE_TABLET | Freq: Every day | ORAL | Status: DC
  Administered 2023-01-19 – 2023-01-24 (×6): 81 mg via ORAL
  Filled 2023-01-19 (×6): qty 1

## 2023-01-19 MED ORDER — LORATADINE 10 MG PO TABS
10.0000 mg | ORAL_TABLET | Freq: Every day | ORAL | Status: DC
  Administered 2023-01-19 – 2023-01-25 (×7): 10 mg via ORAL
  Filled 2023-01-19 (×7): qty 1

## 2023-01-19 MED ORDER — POLYETHYLENE GLYCOL 3350 17 G PO PACK
17.0000 g | PACK | Freq: Every day | ORAL | Status: DC | PRN
  Administered 2023-01-24 – 2023-01-25 (×2): 17 g via ORAL
  Filled 2023-01-19 (×2): qty 1

## 2023-01-19 MED ORDER — LEVALBUTEROL HCL 0.63 MG/3ML IN NEBU: 0.6300 mg | INHALATION_SOLUTION | RESPIRATORY_TRACT | Status: DC | PRN

## 2023-01-19 MED ORDER — DOCUSATE SODIUM 100 MG PO CAPS
100.0000 mg | ORAL_CAPSULE | Freq: Two times a day (BID) | ORAL | Status: DC
  Administered 2023-01-19: 100 mg via ORAL
  Filled 2023-01-19: qty 1

## 2023-01-19 MED ORDER — ENOXAPARIN SODIUM 80 MG/0.8ML IJ SOSY
70.0000 mg | PREFILLED_SYRINGE | INTRAMUSCULAR | Status: DC
  Administered 2023-01-19: 70 mg via SUBCUTANEOUS
  Filled 2023-01-19: qty 0.7

## 2023-01-19 MED ORDER — FENTANYL CITRATE PF 50 MCG/ML IJ SOSY
50.0000 ug | PREFILLED_SYRINGE | INTRAMUSCULAR | Status: DC | PRN
  Administered 2023-01-19: 50 ug via INTRAVENOUS
  Filled 2023-01-19: qty 1

## 2023-01-19 MED ORDER — FUROSEMIDE 10 MG/ML IJ SOLN
40.0000 mg | Freq: Once | INTRAMUSCULAR | Status: AC
  Administered 2023-01-19: 40 mg via INTRAVENOUS
  Filled 2023-01-19: qty 4

## 2023-01-19 MED ORDER — ACETAMINOPHEN 325 MG PO TABS: 650.0000 mg | ORAL_TABLET | Freq: Four times a day (QID) | ORAL | Status: DC | PRN

## 2023-01-19 MED ORDER — ONDANSETRON HCL 4 MG/2ML IJ SOLN: 4.0000 mg | Freq: Four times a day (QID) | INTRAMUSCULAR | Status: DC | PRN

## 2023-01-19 MED ORDER — MORPHINE SULFATE (PF) 2 MG/ML IV SOLN: 2.0000 mg | INTRAVENOUS | Status: DC | PRN

## 2023-01-19 MED ORDER — ACETAMINOPHEN 650 MG RE SUPP: 650.0000 mg | Freq: Four times a day (QID) | RECTAL | Status: DC | PRN

## 2023-01-19 MED ORDER — FUROSEMIDE 10 MG/ML IJ SOLN
20.0000 mg | Freq: Once | INTRAMUSCULAR | Status: AC
  Administered 2023-01-19: 20 mg via INTRAVENOUS
  Filled 2023-01-19: qty 2

## 2023-01-19 MED ORDER — ATORVASTATIN CALCIUM 10 MG PO TABS
20.0000 mg | ORAL_TABLET | Freq: Every day | ORAL | Status: DC
  Administered 2023-01-19 – 2023-01-24 (×6): 20 mg via ORAL
  Filled 2023-01-19 (×6): qty 2

## 2023-01-19 MED ORDER — MUPIROCIN 2 % EX OINT
1.0000 | TOPICAL_OINTMENT | Freq: Two times a day (BID) | CUTANEOUS | Status: AC
  Administered 2023-01-19 – 2023-01-24 (×10): 1 via NASAL
  Filled 2023-01-19: qty 22

## 2023-01-19 MED ORDER — OXYCODONE HCL 5 MG PO TABS
5.0000 mg | ORAL_TABLET | ORAL | Status: DC | PRN
  Administered 2023-01-19 – 2023-01-21 (×4): 5 mg via ORAL
  Filled 2023-01-19 (×5): qty 1

## 2023-01-19 MED ORDER — LEVOTHYROXINE SODIUM 88 MCG PO TABS
88.0000 ug | ORAL_TABLET | Freq: Every day | ORAL | Status: DC
  Administered 2023-01-20 – 2023-01-25 (×6): 88 ug via ORAL
  Filled 2023-01-19 (×6): qty 1

## 2023-01-19 MED ORDER — ONDANSETRON HCL 4 MG PO TABS: 4.0000 mg | ORAL_TABLET | Freq: Four times a day (QID) | ORAL | Status: DC | PRN

## 2023-01-19 MED ORDER — SERTRALINE HCL 50 MG PO TABS
50.0000 mg | ORAL_TABLET | Freq: Every day | ORAL | Status: DC
  Administered 2023-01-19 – 2023-01-25 (×7): 50 mg via ORAL
  Filled 2023-01-19 (×7): qty 1

## 2023-01-19 MED ORDER — METOPROLOL TARTRATE 5 MG/5ML IV SOLN: 5.0000 mg | Freq: Four times a day (QID) | INTRAVENOUS | Status: DC | PRN

## 2023-01-19 MED ORDER — ONDANSETRON HCL 4 MG/2ML IJ SOLN
4.0000 mg | Freq: Once | INTRAMUSCULAR | Status: AC
  Administered 2023-01-19: 4 mg via INTRAVENOUS
  Filled 2023-01-19: qty 2

## 2023-01-19 MED ORDER — MORPHINE SULFATE (PF) 4 MG/ML IV SOLN
4.0000 mg | INTRAVENOUS | Status: DC | PRN
  Administered 2023-01-19: 4 mg via INTRAVENOUS
  Filled 2023-01-19: qty 1

## 2023-01-19 NOTE — ED Triage Notes (Addendum)
Pt arrived via [facility] from home post fall while taking a shower this AM. Pt c/o left hip and knee pain. Redness noted to the left side of abdomen. Pt reports he was getting out of shower and slipped in water, landing onto his left side. Pt normally wheelchair and walker dependent without assist for ADL's. Pt denies LOC and denies hitting his head.

## 2023-01-19 NOTE — Progress Notes (Signed)
4 eyes with Tameisha LPN Red bottom, BLE, bruising to left flank and back, pannus Bilat groin, callus to right great toe, small skin tear in crease of buttock 1

## 2023-01-19 NOTE — ED Notes (Signed)
Pt linens changed, peri care provided. Pt states missed urinal.

## 2023-01-19 NOTE — ED Notes (Signed)
Pt d/c now. Waiting for secretary to confirm EMTALA printed and can d/c pt officially from system.

## 2023-01-19 NOTE — Progress Notes (Signed)
Pt. Arrived to unit alert and oriented x4 no c/o pain. Bed in lowset position call bell within reach, family ay bedside.

## 2023-01-19 NOTE — ED Notes (Signed)
Updated [facility] RN that [facility] ED secretary checked in with [facility] about 20 minutes ago at which time they stated still no trucks freed up yet to transport pt.

## 2023-01-19 NOTE — Consult Note (Signed)
Reason for Consult:Left distal femur fx Referring Physician: Rickey Barbara Time called: 1515 Time at bedside: 1531   Christopher [name] is an 72 y.o. male.  HPI: Christopher [name] was transferring out of the shower when he slipped and fell. He had immediate left knee pain and could not get up. He was brought to the ED at [facility] where x-rays showed a distal femur fx. Orthopedic surgery was consulted but recommended orthopedic trauma evaluation and we were called. He was transferred to [facility] for definitive care. He lives at home with his daughter and is at the [facility] level with transfers.  Past Medical History:  Diagnosis Date   Anxiety    CHF (congestive heart failure) (HCC)    COPD (chronic obstructive pulmonary disease) (HCC)    Depression    Dysrhythmia    Hyperlipidemia    Hypertension    Hypothyroidism    Sleep apnea     Past Surgical History:  Procedure Laterality Date   FRACTURE SURGERY     ORIF ANKLE FRACTURE Left 12/17/2020   Procedure: OPEN REDUCTION INTERNAL FIXATION (ORIF) ANKLE FRACTURE;  Surgeon: Gwyneth Revels, DPM;  Location: [facility] ORS;  Service: Podiatry;  Laterality: Left;    Family History  Problem Relation Age of Onset   Hypertension Mother     Social History:  reports that he has never smoked. He has never used smokeless tobacco. He reports that he does not currently use alcohol. He reports that he does not use drugs.  Allergies:  Allergies  Allergen Reactions   Propranolol Other (See Comments)    Hallucinations    Albuterol Other (See Comments)    Causes afib    Gramineae Pollens Other (See Comments)    Sneeze and watery eyes Sneeze and watery eyes   Other Other (See Comments)    Other reaction(s): Other (See Comments) Unable to breath when around cats Other reaction(s): Other (See Comments) Unable to breath when around cats    Medications: I have reviewed the patient's current medications.  Results for orders placed or performed during the hospital encounter  of 01/19/23 (from the past 48 hour(s))  CBC with Differential     Status: Abnormal   Collection Time: 01/19/23  7:54 AM  Result Value Ref Range   WBC 19.9 (H) 4.0 - 10.5 K/uL   RBC 5.16 4.22 - 5.81 MIL/uL   Hemoglobin 13.4 13.0 - 17.0 g/dL   HCT 91.4 78.2 - 95.6 %   MCV 85.7 80.0 - 100.0 fL   MCH 26.0 26.0 - 34.0 pg   MCHC 30.3 30.0 - 36.0 g/dL   RDW 21.3 08.6 - 57.8 %   Platelets 258 150 - 400 K/uL   nRBC 0.0 0.0 - 0.2 %   Neutrophils Relative % 85 %   Neutro Abs 17.2 (H) 1.7 - 7.7 K/uL   Lymphocytes Relative 5 %   Lymphs Abs 0.9 0.7 - 4.0 K/uL   Monocytes Relative 7 %   Monocytes Absolute 1.3 (H) 0.1 - 1.0 K/uL   Eosinophils Relative 0 %   Eosinophils Absolute 0.1 0.0 - 0.5 K/uL   Basophils Relative 1 %   Basophils Absolute 0.1 0.0 - 0.1 K/uL   Immature Granulocytes 2 %   Abs Immature Granulocytes 0.33 (H) 0.00 - 0.07 K/uL    Comment: Performed at [facility], [address] [place], [place] [address]  Comprehensive metabolic panel     Status: Abnormal   Collection Time: 01/19/23  7:54 AM  Result Value  Ref Range   Sodium 140 135 - 145 mmol/L   Potassium 5.0 3.5 - 5.1 mmol/L   Chloride 103 98 - 111 mmol/L   CO2 28 22 - 32 mmol/L   Glucose, Bld 110 (H) 70 - 99 mg/dL    Comment: Glucose reference range applies only to samples taken after fasting for at least 8 hours.   BUN 57 (H) 8 - 23 mg/dL   Creatinine, Ser 1.61 (H) 0.61 - 1.24 mg/dL   Calcium 9.3 8.9 - 09.6 mg/dL   Total Protein 6.8 6.5 - 8.1 g/dL   Albumin 3.9 3.5 - 5.0 g/dL   AST 19 15 - 41 U/L   ALT 11 0 - 44 U/L   Alkaline Phosphatase 55 38 - 126 U/L   Total Bilirubin 0.6 0.3 - 1.2 mg/dL   GFR, Estimated 41 (L) >60 mL/min    Comment: (NOTE) Calculated using the CKD-EPI Creatinine Equation (2021)    Anion gap 9 5 - 15    Comment: Performed at [facility], [address] [place], [place] [address]  Sample to Blood Bank     Status: None   Collection Time: 01/19/23  7:54 AM  Result Value  Ref Range   Blood Bank Specimen SAMPLE AVAILABLE FOR TESTING    Sample Expiration      01/22/2023,2359 Performed at [facility], [address], [place], [place] [address]     CT Knee Left Wo Contrast  Result Date: 01/19/2023 CLINICAL DATA:  Left distal femur fracture after fall. EXAM: CT OF THE LEFT KNEE WITHOUT CONTRAST TECHNIQUE: Multidetector CT imaging of the left knee was performed according to the standard protocol. Multiplanar CT image reconstructions were also generated. RADIATION DOSE REDUCTION: This exam was performed according to the departmental dose-optimization program which includes automated exposure control, adjustment of the mA and/or kV according to patient size and/or use of iterative reconstruction technique. COMPARISON:  Left knee x-rays from same day. FINDINGS: Bones/Joint/Cartilage Acute comminuted fracture of the distal femoral metaphysis with 1.2 cm posterior displacement 3 cm of overriding. No additional acute fracture. No dislocation. Old healed fracture of the proximal fibula. Moderate medial and mild-to-moderate patellofemoral joint space narrowing. Tricompartmental marginal osteophytes. Trace joint effusion. Ligaments Ligaments are suboptimally evaluated by CT. Muscles and Tendons Grossly intact. Soft tissue No fluid collection or hematoma.  No soft tissue mass. IMPRESSION: 1. Acute comminuted impacted fracture of the distal femoral metaphysis as described above. 2. Old healed fracture of the proximal fibula. 3. Tricompartmental osteoarthritis. Electronically Signed   By: Obie Dredge M.D.   On: 01/19/2023 09:39   DG Chest Portable 1 View  Result Date: 01/19/2023 CLINICAL DATA:  Shortness of breath. EXAM: PORTABLE CHEST 1 VIEW COMPARISON:  10/17/2021 FINDINGS: Again noted are enlarged central vascular structures with slightly enlarged interstitial lung markings. Findings are concerning for pulmonary edema. Heart size is upper limits of normal and stable.  Trachea is midline. Negative for a pneumothorax. No acute bone abnormality. IMPRESSION: Prominent central vascular structures with enlarged lung markings. Findings are concerning for pulmonary edema. Electronically Signed   By: Richarda Overlie M.D.   On: 01/19/2023 08:26   DG Knee Complete 4 Views Left  Result Date: 01/19/2023 CLINICAL DATA:  Knee pain.  Patient fell in the shower this morning. EXAM: LEFT KNEE - COMPLETE 4+ VIEW COMPARISON:  None Available. FINDINGS: Acute comminuted fracture through the distal femoral metaphysis. There is associated soft tissue swelling and probable knee hemarthrosis. Healed fracture of the  proximal fibula is noted. Mild tricompartmental degenerative changes are also present. IMPRESSION: Comminuted fracture through the distal femoral metaphysis with associated soft tissue swelling and hemarthrosis. Remote healed proximal fibular fracture. Electronically Signed   By: Malachy Moan M.D.   On: 01/19/2023 06:38   DG Hip Unilat W or Wo Pelvis 2-3 Views Left  Result Date: 01/19/2023 CLINICAL DATA:  Fall, severe knee and hip pain EXAM: DG HIP (WITH OR WITHOUT PELVIS) 2-3V LEFT COMPARISON:  Concurrently obtained radiographs of the knee FINDINGS: No acute fracture or malalignment. 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 IMPRESSION: 1. No acute fracture or malalignment. 2. Mild bilateral hip joint degenerative osteoarthritis. Electronically Signed   By: Malachy Moan M.D.   On: 01/19/2023 06:36    Review of Systems  HENT:  Negative for ear discharge, ear pain, hearing loss and tinnitus.   Eyes:  Negative for photophobia and pain.  Respiratory:  Negative for cough and shortness of breath.   Cardiovascular:  Negative for chest pain.  Gastrointestinal:  Negative for abdominal pain, nausea and vomiting.  Genitourinary:  Negative for  dysuria, flank pain, frequency and urgency.  Musculoskeletal:  Positive for arthralgias (Left knee). Negative for back pain, myalgias and neck pain.  Neurological:  Negative for dizziness and headaches.  Hematological:  Does not bruise/bleed easily.  Psychiatric/Behavioral:  The patient is not nervous/anxious.    There were no vitals taken for this visit. Physical Exam Constitutional:      General: He is not in acute distress.    Appearance: He is well-developed. He is not diaphoretic.  HENT:     Head: Normocephalic and atraumatic.  Eyes:     General: No scleral icterus.       Right eye: No discharge.        Left eye: No discharge.     Conjunctiva/sclera: Conjunctivae normal.  Cardiovascular:     Rate and Rhythm: Normal rate and regular rhythm.  Pulmonary:     Effort: Pulmonary effort is normal. No respiratory distress.  Musculoskeletal:     Cervical back: Normal range of motion.     Comments: LLE No traumatic wounds, ecchymosis, or rash  KI in place  No ankle effusion  Sens DPN, SPN, TN intact  Motor EHL, ext, flex, evers 5/5  DP 2+, PT 2+, No significant edema  Skin:    General: Skin is warm and dry.  Neurological:     Mental Status: He is alert.  Psychiatric:        Mood and Affect: Mood normal.        Behavior: Behavior normal.     Assessment/Plan: Left distal femur fx -- Plan ORIF tomorrow with Dr. Jena Gauss. Please keep NPO after MN.    Freeman Caldron, PA-C Orthopedic Surgery [phone] 01/19/2023, 3:33 PM

## 2023-01-19 NOTE — Anesthesia Preprocedure Evaluation (Signed)
Anesthesia Evaluation  Patient identified by MRN, date of birth, ID band Patient awake    Reviewed: Allergy & Precautions, NPO status , Patient's Chart, lab work & pertinent test results  History of Anesthesia Complications Negative for: history of anesthetic complications  Airway Mallampati: II  TM Distance: >3 FB Neck ROM: Full   Comment: Previous grade I view with McGrath 4, easy mask  Full facial hair Dental  (+) Dental Advisory Given   Pulmonary neg shortness of breath, sleep apnea and Continuous Positive Airway Pressure Ventilation , COPD (uses 2L O2 at night),  oxygen dependent, neg recent URI CXR 01/19/2023: IMPRESSION: Prominent central vascular structures with enlarged lung markings. Findings are concerning for pulmonary edema.  S/p IV Lasix   Pulmonary exam normal breath sounds clear to auscultation       Cardiovascular hypertension, (-) angina +CHF  (-) Past MI and (-) Cardiac Stents + dysrhythmias Atrial Fibrillation  Rhythm:Regular Rate:Normal  HLD  TTE 04/21/2021: IMPRESSIONS     1. Left ventricular ejection fraction, by estimation, is 60 to 65%. The  left ventricle has normal function. The left ventricle has no regional  wall motion abnormalities. Left ventricular diastolic parameters are  indeterminate.   2. Right ventricular systolic function is normal. The right ventricular  size is normal.   3. The mitral valve is normal in structure. Mild mitral valve  regurgitation. No evidence of mitral stenosis.   4. The aortic valve is normal in structure. Aortic valve regurgitation is  not visualized. No aortic stenosis is present.   5. The inferior vena cava is normal in size with greater than 50%  respiratory variability, suggesting right atrial pressure of 3 mmHg.     Neuro/Psych  PSYCHIATRIC DISORDERS Anxiety Depression    negative neurological ROS     GI/Hepatic negative GI ROS, Neg liver ROS,,,   Endo/Other  neg diabetesHypothyroidism  Morbid obesity  Renal/GU CRFRenal disease     Musculoskeletal  (+) Arthritis ,    Abdominal  (+) + obese  Peds  Hematology negative hematology ROS (+)   Anesthesia Other Findings 72 y.o. male with medical history significant for CKD stage III, morbid obesity, hypertension, hyperlipidemia, COPD on oxygen as needed being admitted to the hospital with left distal femur fracture.  Patient is wheelchair-bound at baseline, walks a few steps with rolling walker at home.   K 5.0  Reports albuterol sent him into afib.  Reproductive/Obstetrics                             Anesthesia Physical Anesthesia Plan  ASA: 3  Anesthesia Plan: General   Post-op Pain Management: Tylenol PO (pre-op)*   Induction: Intravenous  PONV Risk Score and Plan: 2 and Ondansetron, Dexamethasone and Treatment may vary due to age or medical condition  Airway Management Planned: Oral ETT and Video Laryngoscope Planned  Additional Equipment:   Intra-op Plan:   Post-operative Plan: Extubation in OR  Informed Consent: I have reviewed the patients History and Physical, chart, labs and discussed the procedure including the risks, benefits and alternatives for the proposed anesthesia with the patient or authorized representative who has indicated his/her understanding and acceptance.     Dental advisory given  Plan Discussed with: Anesthesiologist and CRNA  Anesthesia Plan Comments: (Risks of general anesthesia discussed including, but not limited to, sore throat, hoarse voice, chipped/damaged teeth, injury to vocal cords, nausea and vomiting, allergic reactions, lung infection, heart attack,  stroke, and death. All questions answered. )       Anesthesia Quick Evaluation

## 2023-01-19 NOTE — ED Notes (Signed)
Pt has been using urinal frequently per daughter at bedside. States she empties it as needed. Currently this RN notes 200cc of urine in urinal.

## 2023-01-19 NOTE — ED Notes (Signed)
Report to Tameshia at [facility] [address] [place]

## 2023-01-19 NOTE — ED Notes (Signed)
[facility]  called per  Dr  Robinson  MD 

## 2023-01-19 NOTE — H&P (View-Only) (Signed)
Reason for Consult:Left distal femur fx Referring Physician: Stephen Chiu Time called: 1515 Time at bedside: 1531   Christopher [name] is an 72 y.o. male.  HPI: Kimm was transferring out of the shower when he slipped and fell. He had immediate left knee pain and could not get up. He was brought to the ED at [facility] where x-rays showed a distal femur fx. Orthopedic surgery was consulted but recommended orthopedic trauma evaluation and we were called. He was transferred to [facility] for definitive care. He lives at home with his daughter and is at the WC level with transfers.  Past Medical History:  Diagnosis Date   Anxiety    CHF (congestive heart failure) (HCC)    COPD (chronic obstructive pulmonary disease) (HCC)    Depression    Dysrhythmia    Hyperlipidemia    Hypertension    Hypothyroidism    Sleep apnea     Past Surgical History:  Procedure Laterality Date   FRACTURE SURGERY     ORIF ANKLE FRACTURE Left 12/17/2020   Procedure: OPEN REDUCTION INTERNAL FIXATION (ORIF) ANKLE FRACTURE;  Surgeon: Fowler, Justin, DPM;  Location: [facility] ORS;  Service: Podiatry;  Laterality: Left;    Family History  Problem Relation Age of Onset   Hypertension Mother     Social History:  reports that he has never smoked. He has never used smokeless tobacco. He reports that he does not currently use alcohol. He reports that he does not use drugs.  Allergies:  Allergies  Allergen Reactions   Propranolol Other (See Comments)    Hallucinations    Albuterol Other (See Comments)    Causes afib    Gramineae Pollens Other (See Comments)    Sneeze and watery eyes Sneeze and watery eyes   Other Other (See Comments)    Other reaction(s): Other (See Comments) Unable to breath when around cats Other reaction(s): Other (See Comments) Unable to breath when around cats    Medications: I have reviewed the patient's current medications.  Results for orders placed or performed during the hospital encounter  of 01/19/23 (from the past 48 hour(s))  CBC with Differential     Status: Abnormal   Collection Time: 01/19/23  7:54 AM  Result Value Ref Range   WBC 19.9 (H) 4.0 - 10.5 K/uL   RBC 5.16 4.22 - 5.81 MIL/uL   Hemoglobin 13.4 13.0 - 17.0 g/dL   HCT 44.2 39.0 - 52.0 %   MCV 85.7 80.0 - 100.0 fL   MCH 26.0 26.0 - 34.0 pg   MCHC 30.3 30.0 - 36.0 g/dL   RDW 15.1 11.5 - 15.5 %   Platelets 258 150 - 400 K/uL   nRBC 0.0 0.0 - 0.2 %   Neutrophils Relative % 85 %   Neutro Abs 17.2 (H) 1.7 - 7.7 K/uL   Lymphocytes Relative 5 %   Lymphs Abs 0.9 0.7 - 4.0 K/uL   Monocytes Relative 7 %   Monocytes Absolute 1.3 (H) 0.1 - 1.0 K/uL   Eosinophils Relative 0 %   Eosinophils Absolute 0.1 0.0 - 0.5 K/uL   Basophils Relative 1 %   Basophils Absolute 0.1 0.0 - 0.1 K/uL   Immature Granulocytes 2 %   Abs Immature Granulocytes 0.33 (H) 0.00 - 0.07 K/uL    Comment: Performed at [facility], [address] [place],  [address]  Comprehensive metabolic panel     Status: Abnormal   Collection Time: 01/19/23  7:54 AM  Result Value   Ref Range   Sodium 140 135 - 145 mmol/L   Potassium 5.0 3.5 - 5.1 mmol/L   Chloride 103 98 - 111 mmol/L   CO2 28 22 - 32 mmol/L   Glucose, Bld 110 (H) 70 - 99 mg/dL    Comment: Glucose reference range applies only to samples taken after fasting for at least 8 hours.   BUN 57 (H) 8 - 23 mg/dL   Creatinine, Ser 1.75 (H) 0.61 - 1.24 mg/dL   Calcium 9.3 8.9 - 10.3 mg/dL   Total Protein 6.8 6.5 - 8.1 g/dL   Albumin 3.9 3.5 - 5.0 g/dL   AST 19 15 - 41 U/L   ALT 11 0 - 44 U/L   Alkaline Phosphatase 55 38 - 126 U/L   Total Bilirubin 0.6 0.3 - 1.2 mg/dL   GFR, Estimated 41 (L) >60 mL/min    Comment: (NOTE) Calculated using the CKD-EPI Creatinine Equation (2021)    Anion gap 9 5 - 15    Comment: Performed at [facility], [address] [place], [place] [address]  Sample to Blood Bank     Status: None   Collection Time: 01/19/23  7:54 AM  Result Value  Ref Range   Blood Bank Specimen SAMPLE AVAILABLE FOR TESTING    Sample Expiration      01/22/2023,2359 Performed at [facility], [address] [place],  [address]     CT Knee Left Wo Contrast  Result Date: 01/19/2023 CLINICAL DATA:  Left distal femur fracture after fall. EXAM: CT OF THE LEFT KNEE WITHOUT CONTRAST TECHNIQUE: Multidetector CT imaging of the left knee was performed according to the standard protocol. Multiplanar CT image reconstructions were also generated. RADIATION DOSE REDUCTION: This exam was performed according to the departmental dose-optimization program which includes automated exposure control, adjustment of the mA and/or kV according to patient size and/or use of iterative reconstruction technique. COMPARISON:  Left knee x-rays from same day. FINDINGS: Bones/Joint/Cartilage Acute comminuted fracture of the distal femoral metaphysis with 1.2 cm posterior displacement 3 cm of overriding. No additional acute fracture. No dislocation. Old healed fracture of the proximal fibula. Moderate medial and mild-to-moderate patellofemoral joint space narrowing. Tricompartmental marginal osteophytes. Trace joint effusion. Ligaments Ligaments are suboptimally evaluated by CT. Muscles and Tendons Grossly intact. Soft tissue No fluid collection or hematoma.  No soft tissue mass. IMPRESSION: 1. Acute comminuted impacted fracture of the distal femoral metaphysis as described above. 2. Old healed fracture of the proximal fibula. 3. Tricompartmental osteoarthritis. Electronically Signed   By: William T Derry M.D.   On: 01/19/2023 09:39   DG Chest Portable 1 View  Result Date: 01/19/2023 CLINICAL DATA:  Shortness of breath. EXAM: PORTABLE CHEST 1 VIEW COMPARISON:  10/17/2021 FINDINGS: Again noted are enlarged central vascular structures with slightly enlarged interstitial lung markings. Findings are concerning for pulmonary edema. Heart size is upper limits of normal and stable.  Trachea is midline. Negative for a pneumothorax. No acute bone abnormality. IMPRESSION: Prominent central vascular structures with enlarged lung markings. Findings are concerning for pulmonary edema. Electronically Signed   By: Adam  Henn M.D.   On: 01/19/2023 08:26   DG Knee Complete 4 Views Left  Result Date: 01/19/2023 CLINICAL DATA:  Knee pain.  Patient fell in the shower this morning. EXAM: LEFT KNEE - COMPLETE 4+ VIEW COMPARISON:  None Available. FINDINGS: Acute comminuted fracture through the distal femoral metaphysis. There is associated soft tissue swelling and probable knee hemarthrosis. Healed fracture of the   proximal fibula is noted. Mild tricompartmental degenerative changes are also present. IMPRESSION: Comminuted fracture through the distal femoral metaphysis with associated soft tissue swelling and hemarthrosis. Remote healed proximal fibular fracture. Electronically Signed   By: Heath  McCullough M.D.   On: 01/19/2023 06:38   DG Hip Unilat W or Wo Pelvis 2-3 Views Left  Result Date: 01/19/2023 CLINICAL DATA:  Fall, severe knee and hip pain EXAM: DG HIP (WITH OR WITHOUT PELVIS) 2-3V LEFT COMPARISON:  Concurrently obtained radiographs of the knee FINDINGS: No acute fracture or malalignment. 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 IMPRESSION: 1. No acute fracture or malalignment. 2. Mild bilateral hip joint degenerative osteoarthritis. Electronically Signed   By: Heath  McCullough M.D.   On: 01/19/2023 06:36    Review of Systems  HENT:  Negative for ear discharge, ear pain, hearing loss and tinnitus.   Eyes:  Negative for photophobia and pain.  Respiratory:  Negative for cough and shortness of breath.   Cardiovascular:  Negative for chest pain.  Gastrointestinal:  Negative for abdominal pain, nausea and vomiting.  Genitourinary:  Negative for  dysuria, flank pain, frequency and urgency.  Musculoskeletal:  Positive for arthralgias (Left knee). Negative for back pain, myalgias and neck pain.  Neurological:  Negative for dizziness and headaches.  Hematological:  Does not bruise/bleed easily.  Psychiatric/Behavioral:  The patient is not nervous/anxious.    There were no vitals taken for this visit. Physical Exam Constitutional:      General: He is not in acute distress.    Appearance: He is well-developed. He is not diaphoretic.  HENT:     Head: Normocephalic and atraumatic.  Eyes:     General: No scleral icterus.       Right eye: No discharge.        Left eye: No discharge.     Conjunctiva/sclera: Conjunctivae normal.  Cardiovascular:     Rate and Rhythm: Normal rate and regular rhythm.  Pulmonary:     Effort: Pulmonary effort is normal. No respiratory distress.  Musculoskeletal:     Cervical back: Normal range of motion.     Comments: LLE No traumatic wounds, ecchymosis, or rash  KI in place  No ankle effusion  Sens DPN, SPN, TN intact  Motor EHL, ext, flex, evers 5/5  DP 2+, PT 2+, No significant edema  Skin:    General: Skin is warm and dry.  Neurological:     Mental Status: He is alert.  Psychiatric:        Mood and Affect: Mood normal.        Behavior: Behavior normal.     Assessment/Plan: Left distal femur fx -- Plan ORIF tomorrow with Dr. Haddix. Please keep NPO after MN.    Terese Heier J. Yahaira Bruski, PA-C Orthopedic Surgery [phone] 01/19/2023, 3:33 PM  

## 2023-01-19 NOTE — ED Provider Notes (Signed)
[facility] Provider Note    Event Date/Time   First MD Initiated Contact with Patient 01/19/23 [id number]     (approximate)   History   Fall   HPI  Christopher [name] is a 72 y.o. male who presents to the ER for evaluation of acute left knee pain that occurred after he slipped getting out of shower this morning.  Did not hit his head.  No neck pain.  Primary complaint is knee pain inability to bear weight.  Typically uses a wheelchair and rollator for mobility.  Does take an aspirin not on any blood thinners.  History of CHF.     Physical Exam   Triage Vital Signs: ED Triage Vitals  Enc Vitals Group     BP 01/19/23 0421 122/68     Pulse Rate 01/19/23 0421 83     Resp 01/19/23 0421 15     Temp 01/19/23 0421 98 F (36.7 C)     Temp Source 01/19/23 0421 Oral     SpO2 01/19/23 0421 95 %     Weight 01/19/23 0436 (!) 310 lb (140.6 kg)     Height 01/19/23 0436 6' (1.829 m)     Head Circumference --      Peak Flow --      Pain Score 01/19/23 0435 10     Pain Loc --      Pain Edu? --      Excl. in GC? --     Most recent vital signs: Vitals:   01/19/23 1000 01/19/23 1002  BP: 130/76   Pulse: 97   Resp:    Temp:  98 F (36.7 C)  SpO2: 94%      Constitutional: Alert  Eyes: Conjunctivae are normal.  Head: Atraumatic. Nose: No congestion/rhinnorhea. Mouth/Throat: Mucous membranes are moist.   Neck: Painless ROM.  Cardiovascular:   Good peripheral circulation. Respiratory: Normal respiratory effort.  No retractions.  Gastrointestinal: Soft and nontender.  Musculoskeletal: Tenderness palpation of the left knee.  Exam limited secondary to edema and body habitus.  Neurovascular intact distally.  On the second toe from today's injury there is a blood blister.  Bilateral lower extremities do appear to have chronic venous stasis changes.  No significant warmth. Neurologic:  MAE spontaneously. No gross focal neurologic deficits are appreciated.  Skin:  Skin  is warm, dry and intact. No rash noted. Psychiatric: Mood and affect are normal. Speech and behavior are normal.    ED Results / Procedures / Treatments   Labs (all labs ordered are listed, but only abnormal results are displayed) Labs Reviewed  CBC WITH DIFFERENTIAL/PLATELET - Abnormal; Notable for the following components:      Result Value   WBC 19.9 (*)    Neutro Abs 17.2 (*)    Monocytes Absolute 1.3 (*)    Abs Immature Granulocytes 0.33 (*)    All other components within normal limits  COMPREHENSIVE METABOLIC PANEL - Abnormal; Notable for the following components:   Glucose, Bld 110 (*)    BUN 57 (*)    Creatinine, Ser 1.75 (*)    GFR, Estimated 41 (*)    All other components within normal limits  SAMPLE TO BLOOD BANK     EKG ED ECG REPORT I, Willy Eddy, the attending physician, personally viewed and interpreted this ECG.   Date: 01/19/2023  EKG Time: 8:16  Rate: 90  Rhythm: sinus  Axis: normal  Intervals: normal  ST&T Change: no stemi, no depressions  RADIOLOGY Please see ED Course for my review and interpretation.  I personally reviewed all radiographic images ordered to evaluate for the above acute complaints and reviewed radiology reports and findings.  These findings were personally discussed with the patient.  Please see medical record for radiology report.    PROCEDURES:  Critical Care performed: No  Procedures   MEDICATIONS ORDERED IN ED: Medications  morphine (PF) 4 MG/ML injection 4 mg (4 mg Intravenous Given 01/19/23 1000)  fentaNYL (SUBLIMAZE) injection 50 mcg (has no administration in time range)  ondansetron (ZOFRAN) injection 4 mg (4 mg Intravenous Given 01/19/23 0757)  furosemide (LASIX) injection 40 mg (40 mg Intravenous Given 01/19/23 0900)     IMPRESSION / MDM / ASSESSMENT AND PLAN / ED COURSE  I reviewed the triage vital signs and the nursing notes.                              Differential diagnosis includes, but  is not limited to, fracture, contusion, dislocation,  Patient presenting to the ER for evaluation of symptoms as described above.  Based on symptoms, risk factors and considered above differential, this presenting complaint could reflect a potentially life-threatening illness therefore the patient will be placed on continuous pulse oximetry and telemetry for monitoring.  Laboratory evaluation will be sent to evaluate for the above complaints.  Patient with x-ray evidence of distal left femur fracture on my review and interpretation.  Discussed case in consultation with Dr. Abdomen orthopedics here at [facility].  Given nature of the patient's fracture pattern has recommended consultation with Ortho trauma.  I have consulted with Ortho trauma at [facility] who has recommended transfer to their facility for operative management.  Has requested hospitalist admission.  Patient did desaturate down to 89% after receiving IV pain medication.  He is not complaining of any shortness of breath or chest pain at this time.  Clinical Course as of 01/19/23 1034  Wed Jan 19, 2023  1610 Patient did have desaturation after IV morphine.  Chest x-ray with evidence of edema given his peripheral edema will give dose of IV Lasix as he will require admission.  I have spoken with hospitalist at [facility] who is kindly accepted patient to their service. [PR]    Clinical Course User Index [PR] Willy Eddy, MD     FINAL CLINICAL IMPRESSION(S) / ED DIAGNOSES   Final diagnoses:  Other closed fracture of distal end of left femur, initial encounter [facility])     Rx / DC Orders   ED Discharge Orders     None        Note:  This document was prepared using Dragon voice recognition software and may include unintentional dictation errors.    Willy Eddy, MD 01/19/23 1034

## 2023-01-19 NOTE — H&P (Signed)
History and Physical  Benson Rile ZOX:[id number] DOB: Jul 29, 1951 DOA: 01/19/2023  PCP: Danella Penton, MD   Chief Complaint: Fall at home, left knee pain  HPI: Christopher [name] is a 72 y.o. male with medical history significant for CKD stage III, morbid obesity, hypertension, hyperlipidemia, COPD on oxygen as needed being admitted to the hospital with left distal femur fracture.  Patient is wheelchair-bound at baseline, walks a few steps with rolling walker at home.  Lives with his daughter who helps take care of him.  He has been in his usual state of health until late last night when he slipped getting out of the shower.  Immediate left knee pain and inability to bear any weight.  Was brought to the ED at [facility] where x-rays showed a distal femur fracture.  Orthopedic surgery was consulted, recommended transfer to [facility] for orthopedic trauma evaluation.  Review of Systems: Please see HPI for pertinent positives and negatives. A complete 10 system review of systems are otherwise negative.  Past Medical History:  Diagnosis Date   Anxiety    CHF (congestive heart failure) (HCC)    COPD (chronic obstructive pulmonary disease) (HCC)    Depression    Dysrhythmia    Hyperlipidemia    Hypertension    Hypothyroidism    Sleep apnea    Past Surgical History:  Procedure Laterality Date   FRACTURE SURGERY     ORIF ANKLE FRACTURE Left 12/17/2020   Procedure: OPEN REDUCTION INTERNAL FIXATION (ORIF) ANKLE FRACTURE;  Surgeon: Gwyneth Revels, DPM;  Location: [facility] ORS;  Service: Podiatry;  Laterality: Left;    Social History:  reports that he has never smoked. He has never used smokeless tobacco. He reports that he does not currently use alcohol. He reports that he does not use drugs.   Allergies  Allergen Reactions   Propranolol Other (See Comments)    Hallucinations    Albuterol Other (See Comments)    Causes afib    Gramineae Pollens Other (See Comments)    Sneeze and watery  eyes Sneeze and watery eyes   Other Other (See Comments)    Other reaction(s): Other (See Comments) Unable to breath when around cats Other reaction(s): Other (See Comments) Unable to breath when around cats    Family History  Problem Relation Age of Onset   Hypertension Mother      Prior to Admission medications   Medication Sig Start Date End Date Taking? Authorizing Provider  aspirin 81 MG EC tablet Take 81 mg by mouth at bedtime.   Yes [provider]  atorvastatin (LIPITOR) 20 MG tablet Take 20 mg by mouth at bedtime. 10/06/20  Yes [provider]  buPROPion (WELLBUTRIN SR) 150 MG 12 hr tablet Take 150 mg by mouth 2 (two) times daily. After 1st meal of day and after dinner 12/02/20  Yes [provider]  Cholecalciferol 25 MCG (1000 UT) capsule Take 1,000 Units by mouth at bedtime.   Yes [provider]  cyanocobalamin 1000 MCG tablet Take 1,000 mcg by mouth 2 (two) times a week. At bedtime. Sundays and Wednesdays   Yes [provider]  levalbuterol [facility] HFA) 45 MCG/ACT inhaler Inhale 2 puffs into the lungs every 4 (four) hours as needed for wheezing.   Yes [provider]  levothyroxine (SYNTHROID) 88 MCG tablet Take 88 mcg by mouth daily. 30 to 60 minutes before breakfast on an empty stomach and with a glass of water 12/02/20  Yes [provider]  loratadine (CLARITIN) 10 MG tablet Take 10 mg by mouth daily.   Yes [provider]  Magnesium 250 MG TABS Take 1 tablet by mouth daily.   Yes [provider]  midodrine (PROAMATINE) 10 MG tablet Take 1 tablet (10 mg total) by mouth 3 (three) times daily with meals. 05/23/21  Yes Wieting, Richard, MD  Multiple Vitamins-Minerals (PRESERVISION AREDS 2) CAPS Take 1 capsule by mouth 2 (two) times daily. With food. After 1st meal of day and after dinner   Yes [provider]  potassium chloride SA (KLOR-CON) 20 MEQ tablet Take 1 tablet (20 mEq total) by  mouth daily. 05/24/21  Yes Wieting, Richard, MD  sertraline (ZOLOFT) 50 MG tablet Take 50 mg by mouth daily.   Yes [provider]  torsemide (DEMADEX) 20 MG tablet Take 1 tablet (20 mg total) by mouth daily. 05/24/21  Yes Wieting, Richard, MD  traMADol (ULTRAM) 50 MG tablet Take 50 mg by mouth every 12 (twelve) hours as needed for moderate pain.   Yes [provider]  trolamine salicylate (ASPERCREME) 10 % cream Apply 1 application topically as needed for muscle pain. Apply to knees and feet   Yes [provider]  acetaminophen (TYLENOL) 325 MG tablet Take 2 tablets (650 mg total) by mouth every 6 (six) hours as needed for mild pain, fever, headache or moderate pain. 05/23/21   Alford Highland, MD  FLOVENT HFA 110 MCG/ACT inhaler Inhale 1 puff into the lungs 2 (two) times daily. 07/29/21   [provider]  ipratropium (ATROVENT) 0.06 % nasal spray Place 2 sprays into both nostrils 3 (three) times daily as needed for rhinitis. 10/07/21   [provider]  OXYGEN Inhale 2 L into the lungs continuous.    [provider]  Vitamins A & D (VITAMIN A & D) ointment Apply 1 application topically as needed. To groin area.    [provider]    Physical Exam: BP 125/74   Pulse 87   Temp 98.2 F (36.8 C) (Oral)   Resp 17   Wt (!) 151.3 kg   BMI 45.24 kg/m   General:  Alert, oriented, calm, in no acute distress, morbidly obese.  Daughter at bedside.  Wearing 2 L nasal cannula oxygen which is chronic for her during the day as needed, intermittent night as needed Eyes: EOMI, clear conjuctivae, white sclerea Neck: supple, no masses, trachea mildline  Cardiovascular: RRR, no murmurs or rubs, he has nonpitting 2+ lower extremity edema  Respiratory: clear to auscultation bilaterally breath sounds are distant, no wheezes, no crackles  Abdomen: soft, nontender, obese, nondistended, normal bowel tones heard  Skin: dry, no rashes  Musculoskeletal: no  joint effusions, normal range of motion  Psychiatric: appropriate affect, normal speech  Neurologic: extraocular muscles intact, clear speech, moving all extremities with intact sensorium          Labs on Admission:  Basic Metabolic Panel: Recent Labs  Lab 01/19/23 0754  NA 140  K 5.0  CL 103  CO2 28  GLUCOSE 110*  BUN 57*  CREATININE 1.75*  CALCIUM 9.3   Liver Function Tests: Recent Labs  Lab 01/19/23 0754  AST 19  ALT 11  ALKPHOS 55  BILITOT 0.6  PROT 6.8  ALBUMIN 3.9   No results for input(s): "LIPASE", "AMYLASE" in the last 168 hours. No results for input(s): "AMMONIA" in the last 168 hours. CBC: Recent Labs  Lab 01/19/23 0754  WBC 19.9*  NEUTROABS 17.2*  HGB  13.4  HCT 44.2  MCV 85.7  PLT 258   Cardiac Enzymes: No results for input(s): "CKTOTAL", "CKMB", "CKMBINDEX", "TROPONINI" in the last 168 hours.  BNP (last 3 results) No results for input(s): "BNP" in the last 8760 hours.  ProBNP (last 3 results) No results for input(s): "PROBNP" in the last 8760 hours.  CBG: No results for input(s): "GLUCAP" in the last 168 hours.  Radiological Exams on Admission: CT Knee Left Wo Contrast  Result Date: 01/19/2023 CLINICAL DATA:  Left distal femur fracture after fall. EXAM: CT OF THE LEFT KNEE WITHOUT CONTRAST TECHNIQUE: Multidetector CT imaging of the left knee was performed according to the standard protocol. Multiplanar CT image reconstructions were also generated. RADIATION DOSE REDUCTION: This exam was performed according to the departmental dose-optimization program which includes automated exposure control, adjustment of the mA and/or kV according to patient size and/or use of iterative reconstruction technique. COMPARISON:  Left knee x-rays from same day. FINDINGS: Bones/Joint/Cartilage Acute comminuted fracture of the distal femoral metaphysis with 1.2 cm posterior displacement 3 cm of overriding. No additional acute fracture. No dislocation. Old healed  fracture of the proximal fibula. Moderate medial and mild-to-moderate patellofemoral joint space narrowing. Tricompartmental marginal osteophytes. Trace joint effusion. Ligaments Ligaments are suboptimally evaluated by CT. Muscles and Tendons Grossly intact. Soft tissue No fluid collection or hematoma.  No soft tissue mass. IMPRESSION: 1. Acute comminuted impacted fracture of the distal femoral metaphysis as described above. 2. Old healed fracture of the proximal fibula. 3. Tricompartmental osteoarthritis. Electronically Signed   By: Obie Dredge M.D.   On: 01/19/2023 09:39   DG Chest Portable 1 View  Result Date: 01/19/2023 CLINICAL DATA:  Shortness of breath. EXAM: PORTABLE CHEST 1 VIEW COMPARISON:  10/17/2021 FINDINGS: Again noted are enlarged central vascular structures with slightly enlarged interstitial lung markings. Findings are concerning for pulmonary edema. Heart size is upper limits of normal and stable. Trachea is midline. Negative for a pneumothorax. No acute bone abnormality. IMPRESSION: Prominent central vascular structures with enlarged lung markings. Findings are concerning for pulmonary edema. Electronically Signed   By: Richarda Overlie M.D.   On: 01/19/2023 08:26   DG Knee Complete 4 Views Left  Result Date: 01/19/2023 CLINICAL DATA:  Knee pain.  Patient fell in the shower this morning. EXAM: LEFT KNEE - COMPLETE 4+ VIEW COMPARISON:  None Available. FINDINGS: Acute comminuted fracture through the distal femoral metaphysis. There is associated soft tissue swelling and probable knee hemarthrosis. Healed fracture of the proximal fibula is noted. Mild tricompartmental degenerative changes are also present. IMPRESSION: Comminuted fracture through the distal femoral metaphysis with associated soft tissue swelling and hemarthrosis. Remote healed proximal fibular fracture. Electronically Signed   By: Malachy Moan M.D.   On: 01/19/2023 06:38   DG Hip Unilat W or Wo Pelvis 2-3 Views  Left  Result Date: 01/19/2023 CLINICAL DATA:  Fall, severe knee and hip pain EXAM: DG HIP (WITH OR WITHOUT PELVIS) 2-3V LEFT COMPARISON:  Concurrently obtained radiographs of the knee FINDINGS: No acute fracture or malalignment. 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 IMPRESSION: 1. No acute fracture or malalignment. 2. Mild bilateral hip joint degenerative osteoarthritis. Electronically Signed   By: Malachy Moan M.D.   On: 01/19/2023 06:36    Assessment/Plan Principal Problem:   Closed left subtrochanteric femur fracture, initial encounter [facility]) -Inpatient admission to MedSurg -Pain control -Nonweightbearing status -Has been  seen by orthopedic surgery, who plans ORIF in the morning -Heart healthy diet, n.p.o. at midnight  Peripheral edema, suspect diastolic dysfunction-last echo in our system from 2022 with preserved EF indeterminate diastolic parameters.  Chest x-ray with concern for possible pulmonary edema. -Will give a dose of IV Lasix x 1 now -Obtain 2D echo -Check BMP    Sleep apnea with COPD overlap-continue supplemental oxygen, no evidence of acute exacerbation, Xopenex as needed shortness of breath   Morbid obesity with BMI of 45.0-49.9, adult (HCC)-complicating all aspects of care   Hyperlipidemia-on Lipitor   Hypothyroidism-Synthroid   Stage 3b chronic kidney disease (CKD) (HCC)-appears to be at baseline, follow with daily labs   Benign essential hypertension-was previously on midodrine at home, but this is normotensive   AF (paroxysmal atrial fibrillation) (HCC)-currently appears to be in sinus rhythm  DVT prophylaxis: Lovenox     Code Status: Full Code  Consults called: Orthopedic surgery, has already seen.  Admission status: The appropriate patient status for this patient is INPATIENT. Inpatient status is judged to be  reasonable and necessary in order to provide the required intensity of service to ensure the patient's safety. The patient's presenting symptoms, physical exam findings, and initial radiographic and laboratory data in the context of their chronic comorbidities is felt to place them at high risk for further clinical deterioration. Furthermore, it is not anticipated that the patient will be medically stable for discharge from the hospital within 2 midnights of admission.    I certify that at the point of admission it is my clinical judgment that the patient will require inpatient hospital care spanning beyond 2 midnights from the point of admission due to high intensity of service, high risk for further deterioration and high frequency of surveillance required  Time spent: 53 minutes  Nivea Wojdyla Sharlette Dense MD [facility] Pager [phone]  If 7PM-7AM, please contact night-coverage [email or web address] Password [facility]  01/19/2023, 4:19 PM

## 2023-01-19 NOTE — ED Notes (Signed)
See triage note. Pt reports fall while in shower. Denies hitting head. C/o left knee pain.

## 2023-01-19 NOTE — ED Notes (Signed)
[name] signed consent to transfer/transport for pt with this RN as witness; pt fully aware & agreeable.

## 2023-01-19 NOTE — ED Triage Notes (Signed)
EMS brings pt in from home after he fell in the BR; c/o left hip/knee pain

## 2023-01-19 NOTE — ED Notes (Signed)
Pt placed on 2 L [place] to maintain SpO2 >92%. Dr Roxan Hockey made aware.

## 2023-01-20 ENCOUNTER — Inpatient Hospital Stay (HOSPITAL_COMMUNITY): Payer: Medicare Other

## 2023-01-20 ENCOUNTER — Inpatient Hospital Stay (HOSPITAL_COMMUNITY): Admission: RE | Admit: 2023-01-20 | Payer: Medicare Other | Source: Home / Self Care | Admitting: Student

## 2023-01-20 ENCOUNTER — Encounter (HOSPITAL_COMMUNITY)
Admission: RE | Disposition: A | Discharge status: Skilled Nursing Facility | Payer: Self-pay | Source: Other Acute Inpatient Hospital | Attending: Internal Medicine

## 2023-01-20 ENCOUNTER — Inpatient Hospital Stay (HOSPITAL_COMMUNITY): Payer: Medicare Other | Admitting: Certified Registered"

## 2023-01-20 ENCOUNTER — Other Ambulatory Visit: Payer: Self-pay

## 2023-01-20 DIAGNOSIS — N1832 Chronic kidney disease, stage 3b: Secondary | ICD-10-CM

## 2023-01-20 DIAGNOSIS — I5032 Chronic diastolic (congestive) heart failure: Secondary | ICD-10-CM | POA: Diagnosis not present

## 2023-01-20 DIAGNOSIS — I130 Hypertensive heart and chronic kidney disease with heart failure and stage 1 through stage 4 chronic kidney disease, or unspecified chronic kidney disease: Secondary | ICD-10-CM | POA: Diagnosis not present

## 2023-01-20 DIAGNOSIS — E039 Hypothyroidism, unspecified: Secondary | ICD-10-CM

## 2023-01-20 DIAGNOSIS — E785 Hyperlipidemia, unspecified: Secondary | ICD-10-CM | POA: Diagnosis not present

## 2023-01-20 DIAGNOSIS — Z6841 Body Mass Index (BMI) 40.0 and over, adult: Secondary | ICD-10-CM

## 2023-01-20 DIAGNOSIS — S7222XA Displaced subtrochanteric fracture of left femur, initial encounter for closed fracture: Secondary | ICD-10-CM | POA: Diagnosis not present

## 2023-01-20 DIAGNOSIS — I480 Paroxysmal atrial fibrillation: Secondary | ICD-10-CM

## 2023-01-20 DIAGNOSIS — I10 Essential (primary) hypertension: Secondary | ICD-10-CM | POA: Diagnosis not present

## 2023-01-20 DIAGNOSIS — S72452A Displaced supracondylar fracture without intracondylar extension of lower end of left femur, initial encounter for closed fracture: Secondary | ICD-10-CM | POA: Diagnosis not present

## 2023-01-20 DIAGNOSIS — I509 Heart failure, unspecified: Secondary | ICD-10-CM | POA: Diagnosis not present

## 2023-01-20 DIAGNOSIS — N183 Chronic kidney disease, stage 3 unspecified: Secondary | ICD-10-CM

## 2023-01-20 DIAGNOSIS — J449 Chronic obstructive pulmonary disease, unspecified: Secondary | ICD-10-CM

## 2023-01-20 HISTORY — PX: ORIF FEMUR FRACTURE: SHX2119

## 2023-01-20 LAB — BASIC METABOLIC PANEL
Anion gap: 11 (ref 5–15)
BUN: 46 mg/dL — ABNORMAL HIGH (ref 8–23)
CO2: 28 mmol/L (ref 22–32)
Calcium: 8.8 mg/dL — ABNORMAL LOW (ref 8.9–10.3)
Chloride: 100 mmol/L (ref 98–111)
Creatinine, Ser: 2.04 mg/dL — ABNORMAL HIGH (ref 0.61–1.24)
GFR, Estimated: 34 mL/min — ABNORMAL LOW (ref >60)
Glucose, Bld: 104 mg/dL — ABNORMAL HIGH (ref 70–99)
Potassium: 4.2 mmol/L (ref 3.5–5.1)
Sodium: 139 mmol/L (ref 135–145)

## 2023-01-20 LAB — ECHOCARDIOGRAM COMPLETE
AR max vel: 3.44 cm2
AV Peak grad: 8.8 mmHg
Ao pk vel: 1.48 m/s
Area-P 1/2: 4.21 cm2
Height: 72 in
MV M vel: 1.27 m/s
MV Peak grad: 6.5 mmHg
S' Lateral: 4.2 cm
Weight: 5336.9 oz

## 2023-01-20 LAB — CBC
HCT: 43.9 % (ref 39.0–52.0)
Hemoglobin: 13.6 g/dL (ref 13.0–17.0)
MCH: 26.6 pg (ref 26.0–34.0)
MCHC: 31 g/dL (ref 30.0–36.0)
MCV: 85.9 fL (ref 80.0–100.0)
Platelets: 229 10*3/uL (ref 150–400)
RBC: 5.11 MIL/uL (ref 4.22–5.81)
RDW: 15.6 % — ABNORMAL HIGH (ref 11.5–15.5)
WBC: 9.5 10*3/uL (ref 4.0–10.5)
nRBC: 0 % (ref 0.0–0.2)

## 2023-01-20 SURGERY — OPEN REDUCTION INTERNAL FIXATION (ORIF) DISTAL FEMUR FRACTURE
Anesthesia: General | Laterality: Left

## 2023-01-20 MED ORDER — FENTANYL CITRATE (PF) 250 MCG/5ML IJ SOLN
INTRAMUSCULAR | Status: AC
  Filled 2023-01-20: qty 5

## 2023-01-20 MED ORDER — LIDOCAINE 2% (20 MG/ML) 5 ML SYRINGE
INTRAMUSCULAR | Status: AC
  Filled 2023-01-20: qty 5

## 2023-01-20 MED ORDER — ROCURONIUM BROMIDE 10 MG/ML (PF) SYRINGE
PREFILLED_SYRINGE | INTRAVENOUS | Status: DC | PRN
  Administered 2023-01-20: 60 mg via INTRAVENOUS
  Administered 2023-01-20: 20 mg via INTRAVENOUS

## 2023-01-20 MED ORDER — DOCUSATE SODIUM 100 MG PO CAPS
100.0000 mg | ORAL_CAPSULE | Freq: Two times a day (BID) | ORAL | Status: DC
  Administered 2023-01-20 – 2023-01-25 (×11): 100 mg via ORAL
  Filled 2023-01-20 (×11): qty 1

## 2023-01-20 MED ORDER — PROPOFOL 10 MG/ML IV BOLUS
INTRAVENOUS | Status: DC | PRN
  Administered 2023-01-20: 150 mg via INTRAVENOUS

## 2023-01-20 MED ORDER — SUGAMMADEX SODIUM 200 MG/2ML IV SOLN
INTRAVENOUS | Status: DC | PRN
  Administered 2023-01-20: 400 mg via INTRAVENOUS

## 2023-01-20 MED ORDER — SODIUM CHLORIDE 0.9 % IV SOLN: INTRAVENOUS | Status: DC

## 2023-01-20 MED ORDER — VANCOMYCIN HCL 1000 MG IV SOLR
INTRAVENOUS | Status: DC | PRN
  Administered 2023-01-20: 1000 mg

## 2023-01-20 MED ORDER — FENTANYL CITRATE (PF) 100 MCG/2ML IJ SOLN
INTRAMUSCULAR | Status: AC
  Filled 2023-01-20: qty 2

## 2023-01-20 MED ORDER — ALUM & MAG HYDROXIDE-SIMETH 200-200-20 MG/5ML PO SUSP
15.0000 mL | Freq: Four times a day (QID) | ORAL | Status: DC | PRN
  Administered 2023-01-20: 15 mL via ORAL
  Filled 2023-01-20: qty 30

## 2023-01-20 MED ORDER — OXYCODONE HCL 5 MG/5ML PO SOLN: 5.0000 mg | Freq: Once | ORAL | Status: DC | PRN

## 2023-01-20 MED ORDER — FENTANYL CITRATE (PF) 100 MCG/2ML IJ SOLN
25.0000 ug | INTRAMUSCULAR | Status: DC | PRN
  Administered 2023-01-20: 25 ug via INTRAVENOUS

## 2023-01-20 MED ORDER — MIDAZOLAM HCL 2 MG/2ML IJ SOLN
INTRAMUSCULAR | Status: DC | PRN
  Administered 2023-01-20: 1 mg via INTRAVENOUS

## 2023-01-20 MED ORDER — PROPOFOL 10 MG/ML IV BOLUS
INTRAVENOUS | Status: AC
  Filled 2023-01-20: qty 20

## 2023-01-20 MED ORDER — METHOCARBAMOL 500 MG PO TABS
500.0000 mg | ORAL_TABLET | Freq: Four times a day (QID) | ORAL | Status: DC | PRN
  Administered 2023-01-20 – 2023-01-24 (×4): 500 mg via ORAL
  Filled 2023-01-20 (×4): qty 1

## 2023-01-20 MED ORDER — LIDOCAINE 2% (20 MG/ML) 5 ML SYRINGE
INTRAMUSCULAR | Status: DC | PRN
  Administered 2023-01-20: 100 mg via INTRAVENOUS

## 2023-01-20 MED ORDER — MIDAZOLAM HCL 2 MG/2ML IJ SOLN
INTRAMUSCULAR | Status: AC
  Filled 2023-01-20: qty 2

## 2023-01-20 MED ORDER — METOCLOPRAMIDE HCL 5 MG PO TABS: 5.0000 mg | ORAL_TABLET | Freq: Three times a day (TID) | ORAL | Status: DC | PRN

## 2023-01-20 MED ORDER — CEFAZOLIN SODIUM 1 G IJ SOLR
INTRAMUSCULAR | Status: AC
  Filled 2023-01-20: qty 30

## 2023-01-20 MED ORDER — PHENYLEPHRINE HCL (PRESSORS) 10 MG/ML IV SOLN
INTRAVENOUS | Status: DC | PRN
  Administered 2023-01-20: 160 ug via INTRAVENOUS
  Administered 2023-01-20: 80 ug via INTRAVENOUS

## 2023-01-20 MED ORDER — DEXTROSE 5 % IV SOLN
INTRAVENOUS | Status: DC | PRN
  Administered 2023-01-20: 3 g via INTRAVENOUS

## 2023-01-20 MED ORDER — TRANEXAMIC ACID-NACL 1000-0.7 MG/100ML-% IV SOLN
1000.0000 mg | Freq: Once | INTRAVENOUS | Status: AC
  Administered 2023-01-20: 1000 mg via INTRAVENOUS
  Filled 2023-01-20: qty 100

## 2023-01-20 MED ORDER — DEXMEDETOMIDINE HCL IN NACL 80 MCG/20ML IV SOLN
INTRAVENOUS | Status: DC | PRN
  Administered 2023-01-20: 8 ug via INTRAVENOUS

## 2023-01-20 MED ORDER — ONDANSETRON HCL 4 MG/2ML IJ SOLN
INTRAMUSCULAR | Status: AC
  Filled 2023-01-20: qty 2

## 2023-01-20 MED ORDER — PERFLUTREN LIPID MICROSPHERE
1.0000 mL | INTRAVENOUS | Status: AC | PRN
  Administered 2023-01-20: 4 mL via INTRAVENOUS

## 2023-01-20 MED ORDER — DEXAMETHASONE SODIUM PHOSPHATE 10 MG/ML IJ SOLN
INTRAMUSCULAR | Status: DC | PRN
  Administered 2023-01-20: 5 mg via INTRAVENOUS

## 2023-01-20 MED ORDER — ROCURONIUM BROMIDE 10 MG/ML (PF) SYRINGE
PREFILLED_SYRINGE | INTRAVENOUS | Status: AC
  Filled 2023-01-20: qty 10

## 2023-01-20 MED ORDER — DEXAMETHASONE SODIUM PHOSPHATE 10 MG/ML IJ SOLN
INTRAMUSCULAR | Status: AC
  Filled 2023-01-20: qty 1

## 2023-01-20 MED ORDER — VANCOMYCIN HCL 1000 MG IV SOLR
INTRAVENOUS | Status: AC
  Filled 2023-01-20: qty 20

## 2023-01-20 MED ORDER — OXYCODONE HCL 5 MG PO TABS: 5.0000 mg | ORAL_TABLET | Freq: Once | ORAL | Status: DC | PRN

## 2023-01-20 MED ORDER — CEFAZOLIN SODIUM-DEXTROSE 2-4 GM/100ML-% IV SOLN
2.0000 g | Freq: Three times a day (TID) | INTRAVENOUS | Status: AC
  Administered 2023-01-20 – 2023-01-21 (×3): 2 g via INTRAVENOUS
  Filled 2023-01-20 (×3): qty 100

## 2023-01-20 MED ORDER — ENOXAPARIN SODIUM 80 MG/0.8ML IJ SOSY
70.0000 mg | PREFILLED_SYRINGE | INTRAMUSCULAR | Status: DC
  Administered 2023-01-21 – 2023-01-25 (×5): 70 mg via SUBCUTANEOUS
  Filled 2023-01-20 (×5): qty 0.7

## 2023-01-20 MED ORDER — FENTANYL CITRATE (PF) 250 MCG/5ML IJ SOLN
INTRAMUSCULAR | Status: DC | PRN
  Administered 2023-01-20: 50 ug via INTRAVENOUS

## 2023-01-20 MED ORDER — 0.9 % SODIUM CHLORIDE (POUR BTL) OPTIME
TOPICAL | Status: DC | PRN
  Administered 2023-01-20: 1000 mL

## 2023-01-20 MED ORDER — AMISULPRIDE (ANTIEMETIC) 5 MG/2ML IV SOLN: 10.0000 mg | Freq: Once | INTRAVENOUS | Status: DC | PRN

## 2023-01-20 MED ORDER — HYDROMORPHONE HCL 1 MG/ML IJ SOLN
INTRAMUSCULAR | Status: DC | PRN
  Administered 2023-01-20: .5 mg via INTRAVENOUS

## 2023-01-20 MED ORDER — ONDANSETRON HCL 4 MG/2ML IJ SOLN
INTRAMUSCULAR | Status: DC | PRN
  Administered 2023-01-20: 4 mg via INTRAVENOUS

## 2023-01-20 MED ORDER — METOCLOPRAMIDE HCL 5 MG/ML IJ SOLN: 5.0000 mg | Freq: Three times a day (TID) | INTRAMUSCULAR | Status: DC | PRN

## 2023-01-20 MED ORDER — SODIUM CHLORIDE 0.9 % IV SOLN: INTRAVENOUS | Status: DC | PRN

## 2023-01-20 MED ORDER — HYDROMORPHONE HCL 1 MG/ML IJ SOLN
INTRAMUSCULAR | Status: AC
  Filled 2023-01-20: qty 0.5

## 2023-01-20 MED ORDER — METHOCARBAMOL 1000 MG/10ML IJ SOLN: 500.0000 mg | Freq: Four times a day (QID) | INTRAVENOUS | Status: DC | PRN

## 2023-01-20 SURGICAL SUPPLY — 72 items
APL PRP STRL LF DISP 70% ISPRP (MISCELLANEOUS) ×2
BAG COUNTER SPONGE SURGICOUNT (BAG) ×1 IMPLANT
BAG SPNG CNTER NS LX DISP (BAG) ×1
BIT DRILL 4.3 (BIT) ×1
BIT DRILL 4.3X300MM (BIT) IMPLANT
BIT DRILL LONG 3.3 (BIT) IMPLANT
BIT DRILL QC 3.3X195 (BIT) IMPLANT
BLADE CLIPPER SURG (BLADE) IMPLANT
BNDG CMPR 5X6 CHSV STRCH STRL (GAUZE/BANDAGES/DRESSINGS)
BNDG CMPR MED 10X6 ELC LF (GAUZE/BANDAGES/DRESSINGS)
BNDG CMPR MED 15X6 ELC VLCR LF (GAUZE/BANDAGES/DRESSINGS) ×1
BNDG COHESIVE 6X5 TAN ST LF (GAUZE/BANDAGES/DRESSINGS) ×1 IMPLANT
BNDG ELASTIC 6X10 VLCR STRL LF (GAUZE/BANDAGES/DRESSINGS) ×1 IMPLANT
BNDG ELASTIC 6X15 VLCR STRL LF (GAUZE/BANDAGES/DRESSINGS) IMPLANT
BRUSH SCRUB EZ PLAIN DRY (MISCELLANEOUS) ×2 IMPLANT
CANISTER SUCT 3000ML PPV (MISCELLANEOUS) ×1 IMPLANT
CAP LOCK NCB (Cap) IMPLANT
CHLORAPREP W/TINT 26 (MISCELLANEOUS) ×1 IMPLANT
COVER SURGICAL LIGHT HANDLE (MISCELLANEOUS) ×1 IMPLANT
DRAPE C-ARM 42X72 X-RAY (DRAPES) ×1 IMPLANT
DRAPE C-ARMOR (DRAPES) ×1 IMPLANT
DRAPE HALF SHEET 40X57 (DRAPES) ×2 IMPLANT
DRAPE ORTHO SPLIT 77X108 STRL (DRAPES) ×2
DRAPE SURG 17X23 STRL (DRAPES) ×1 IMPLANT
DRAPE SURG ORHT 6 SPLT 77X108 (DRAPES) ×2 IMPLANT
DRAPE U-SHAPE 47X51 STRL (DRAPES) ×1 IMPLANT
DRESSING MEPILEX FLEX 4X4 (GAUZE/BANDAGES/DRESSINGS) IMPLANT
DRSG ADAPTIC 3X8 NADH LF (GAUZE/BANDAGES/DRESSINGS) IMPLANT
DRSG MEPILEX FLEX 4X4 (GAUZE/BANDAGES/DRESSINGS)
DRSG MEPILEX POST OP 4X12 (GAUZE/BANDAGES/DRESSINGS) IMPLANT
DRSG MEPILEX POST OP 4X8 (GAUZE/BANDAGES/DRESSINGS) IMPLANT
ELECT REM PT RETURN 9FT ADLT (ELECTROSURGICAL) ×1
ELECTRODE REM PT RTRN 9FT ADLT (ELECTROSURGICAL) ×1 IMPLANT
GAUZE PAD ABD 8X10 STRL (GAUZE/BANDAGES/DRESSINGS) ×3 IMPLANT
GAUZE SPONGE 4X4 12PLY STRL (GAUZE/BANDAGES/DRESSINGS) ×1 IMPLANT
GLOVE BIO SURGEON STRL SZ 6.5 (GLOVE) ×3 IMPLANT
GLOVE BIO SURGEON STRL SZ7.5 (GLOVE) ×4 IMPLANT
GLOVE BIOGEL PI IND STRL 6.5 (GLOVE) ×1 IMPLANT
GLOVE BIOGEL PI IND STRL 7.5 (GLOVE) ×1 IMPLANT
GOWN STRL REUS W/ TWL LRG LVL3 (GOWN DISPOSABLE) ×3 IMPLANT
GOWN STRL REUS W/TWL LRG LVL3 (GOWN DISPOSABLE) ×3
K-WIRE 2.0 (WIRE) ×1
K-WIRE FXSTD 280X2XNS SS (WIRE) ×1
KIT BASIN OR (CUSTOM PROCEDURE TRAY) ×1 IMPLANT
KIT TURNOVER KIT B (KITS) ×1 IMPLANT
KWIRE FXSTD 280X2XNS SS (WIRE) IMPLANT
NS IRRIG 1000ML POUR BTL (IV SOLUTION) ×1 IMPLANT
PACK TOTAL JOINT (CUSTOM PROCEDURE TRAY) ×1 IMPLANT
PAD ARMBOARD 7.5X6 YLW CONV (MISCELLANEOUS) ×2 IMPLANT
PAD CAST 4YDX4 CTTN HI CHSV (CAST SUPPLIES) ×1 IMPLANT
PADDING CAST COTTON 4X4 STRL (CAST SUPPLIES) ×1
PADDING CAST COTTON 6X4 STRL (CAST SUPPLIES) ×1 IMPLANT
PLATE FEM DIST NCB PP 278MM (Plate) IMPLANT
SCREW CORTICAL 5.0X95MM (Screw) IMPLANT
SCREW CORTICAL NCB 5.0X90MM (Screw) IMPLANT
SCREW FEM ST NCB 5X100 (Screw) IMPLANT
SCREW NCB 4.0MX44M (Screw) IMPLANT
SCREW NCB 5.0X46MM (Screw) IMPLANT
SPONGE T-LAP 18X18 ~~LOC~~+RFID (SPONGE) IMPLANT
STAPLER VISISTAT 35W (STAPLE) ×1 IMPLANT
SUCTION FRAZIER HANDLE 10FR (MISCELLANEOUS) ×1
SUCTION TUBE FRAZIER 10FR DISP (MISCELLANEOUS) ×1 IMPLANT
SUT ETHILON 3 0 PS 1 (SUTURE) ×2 IMPLANT
SUT VIC AB 0 CT1 27 (SUTURE)
SUT VIC AB 0 CT1 27XBRD ANBCTR (SUTURE) IMPLANT
SUT VIC AB 1 CT1 27 (SUTURE) ×1
SUT VIC AB 1 CT1 27XBRD ANBCTR (SUTURE) IMPLANT
SUT VIC AB 2-0 CT1 27 (SUTURE) ×1
SUT VIC AB 2-0 CT1 TAPERPNT 27 (SUTURE) ×2 IMPLANT
TOWEL GREEN STERILE (TOWEL DISPOSABLE) ×2 IMPLANT
TRAY FOLEY MTR SLVR 16FR STAT (SET/KITS/TRAYS/PACK) IMPLANT
WATER STERILE IRR 1000ML POUR (IV SOLUTION) ×2 IMPLANT

## 2023-01-20 NOTE — Progress Notes (Signed)
Patient refused use of CPAP for the evening 

## 2023-01-20 NOTE — Progress Notes (Signed)
Echocardiogram 2D Echocardiogram has been performed.  Lucendia Herrlich 01/20/2023, 4:06 PM

## 2023-01-20 NOTE — Discharge Instructions (Addendum)
Orthopaedic Trauma Service Discharge Instructions   General Discharge Instructions  WEIGHT BEARING STATUS:Weightbearing left lower extremity  RANGE OF MOTION/ACTIVITY: Ok for unrestricted range of motion left knee, hip, ankle  Wound Care: You may remove your surgical dressing on post-op day #2, (Saturday 01/22/23). Incisions can be left open to air if there is no drainage. Once the incision is completely dry and without drainage, it may be left open to air out.  Showering may begin post-op day #3, (Sunday 01/23/23).  Clean incision gently with soap and water.  DVT/PE prophylaxis:  Eliquis 2.5 mg twice daily x 30 days  Diet: as you were eating previously.  Can use over the counter stool softeners and bowel preparations, such as Miralax, to help with bowel movements.  Narcotics can be constipating.  Be sure to drink plenty of fluids  PAIN MEDICATION USE AND EXPECTATIONS  You have likely been given narcotic medications to help control your pain.  After a traumatic event that results in an fracture (broken bone) with or without surgery, it is ok to use narcotic pain medications to help control one's pain.  We understand that everyone responds to pain differently and each individual patient will be evaluated on a regular basis for the continued need for narcotic medications. Ideally, narcotic medication use should last no more than 6-8 weeks (coinciding with fracture healing).   As a patient it is your responsibility as well to monitor narcotic medication use and report the amount and frequency you use these medications when you come to your office visit.   We would also advise that if you are using narcotic medications, you should take a dose prior to therapy to maximize you participation.  IF YOU ARE ON NARCOTIC MEDICATIONS IT IS NOT PERMISSIBLE TO OPERATE A MOTOR VEHICLE (MOTORCYCLE/CAR/TRUCK/MOPED) OR HEAVY MACHINERY DO NOT MIX NARCOTICS WITH OTHER CNS (CENTRAL NERVOUS SYSTEM) DEPRESSANTS SUCH AS  ALCOHOL   STOP SMOKING OR USING NICOTINE PRODUCTS!!!!  As discussed nicotine severely impairs your body's ability to heal surgical and traumatic wounds but also impairs bone healing.  Wounds and bone heal by forming microscopic blood vessels (angiogenesis) and nicotine is a vasoconstrictor (essentially, shrinks blood vessels).  Therefore, if vasoconstriction occurs to these microscopic blood vessels they essentially disappear and are unable to deliver necessary nutrients to the healing tissue.  This is one modifiable factor that you can do to dramatically increase your chances of healing your injury.    (This means no smoking, no nicotine gum, patches, etc)  DO NOT USE NONSTEROIDAL ANTI-INFLAMMATORY DRUGS (NSAID'S)  Using products such as Advil (ibuprofen), Aleve (naproxen), Motrin (ibuprofen) for additional pain control during fracture healing can delay and/or prevent the healing response.  If you would like to take over the counter (OTC) medication, Tylenol (acetaminophen) is ok.  However, some narcotic medications that are given for pain control contain acetaminophen as well. Therefore, you should not exceed more than 4000 mg of tylenol in a day if you do not have liver disease.  Also note that there are may OTC medicines, such as cold medicines and allergy medicines that my contain tylenol as well.  If you have any questions about medications and/or interactions please ask your doctor/PA or your pharmacist.      ICE AND ELEVATE INJURED/OPERATIVE EXTREMITY  Using ice and elevating the injured extremity above your heart can help with swelling and pain control.  Icing in a pulsatile fashion, such as 20 minutes on and 20 minutes off, can be followed.  Do not place ice directly on skin. Make sure there is a barrier between to skin and the ice pack.    Using frozen items such as frozen peas works well as the conform nicely to the are that needs to be iced.  USE AN ACE WRAP OR TED HOSE FOR SWELLING  CONTROL  In addition to icing and elevation, Ace wraps or TED hose are used to help limit and resolve swelling.  It is recommended to use Ace wraps or TED hose until you are informed to stop.    When using Ace Wraps start the wrapping distally (farthest away from the body) and wrap proximally (closer to the body)   Example: If you had surgery on your leg or thing and you do not have a splint on, start the ace wrap at the toes and work your way up to the thigh        If you had surgery on your upper extremity and do not have a splint on, start the ace wrap at your fingers and work your way up to the upper arm   CALL THE OFFICE WITH ANY QUESTIONS OR CONCERNS: [phone]   VISIT OUR WEBSITE FOR ADDITIONAL INFORMATION: [email or web address]    Discharge Wound Care Instructions  Do NOT apply any ointments, solutions or lotions to pin sites or surgical wounds.  These prevent needed drainage and even though solutions like hydrogen peroxide kill bacteria, they also damage cells lining the pin sites that help fight infection.  Applying lotions or ointments can keep the wounds moist and can cause them to breakdown and open up as well. This can increase the risk for infection. When in doubt call the office.  If any drainage is noted, use one layer of adaptic or Mepitel, then gauze, Kerlix, and an ace wrap. - These dressing supplies should be available at local medical supply stores [facility], [facility], etc) as well as Insurance claims handler ([facility], [facility], [place], etc)  Once the incision is completely dry and without drainage, it may be left open to air out.  Showering may begin 36-48 hours later.  Cleaning gently with soap and water.      Information on my medicine - ELIQUIS (apixaban)  This medication education was reviewed with me or my healthcare representative as part of my discharge preparation.   Why was Eliquis prescribed for you? Eliquis was prescribed for you to reduce the risk  of blood clots forming after orthopedic surgery.    What do You need to know about Eliquis? Take your Eliquis TWICE DAILY - one tablet in the morning and one tablet in the evening with or without food.  It would be best to take the dose about the same time each day.  If you have difficulty swallowing the tablet whole please discuss with your pharmacist how to take the medication safely.  Take Eliquis exactly as prescribed by your doctor and DO NOT stop taking Eliquis without talking to the doctor who prescribed the medication.  Stopping without other medication to take the place of Eliquis may increase your risk of developing a clot.  After discharge, you should have regular check-up appointments with your healthcare provider that is prescribing your Eliquis.  What do you do if you miss a dose? If a dose of ELIQUIS is not taken at the scheduled time, take it as soon as possible on the same day and twice-daily administration should be resumed.  The dose should not be doubled to make up  for a missed dose.  Do not take more than one tablet of ELIQUIS at the same time.  Important Safety Information A possible side effect of Eliquis is bleeding. You should call your healthcare provider right away if you experience any of the following: Bleeding from an injury or your nose that does not stop. Unusual colored urine (red or dark brown) or unusual colored stools (red or black). Unusual bruising for unknown reasons. A serious fall or if you hit your head (even if there is no bleeding).  Some medicines may interact with Eliquis and might increase your risk of bleeding or clotting while on Eliquis. To help avoid this, consult your healthcare provider or pharmacist prior to using any new prescription or non-prescription medications, including herbals, vitamins, non-steroidal anti-inflammatory drugs (NSAIDs) and supplements.  This website has more information on Eliquis (apixaban):  http://www.eliquis.com/eliquis/home

## 2023-01-20 NOTE — Anesthesia Postprocedure Evaluation (Signed)
Anesthesia Post Note  Patient: Nickalos Schooler  Procedure(s) Performed: OPEN REDUCTION INTERNAL FIXATION (ORIF) DISTAL FEMUR FRACTURE (Left)     Patient location during evaluation: PACU Anesthesia Type: General Level of consciousness: awake Pain management: pain level controlled Vital Signs Assessment: post-procedure vital signs reviewed and stable Respiratory status: spontaneous breathing, nonlabored ventilation and respiratory function stable Cardiovascular status: blood pressure returned to baseline and stable Postop Assessment: no apparent nausea or vomiting Anesthetic complications: no   No notable events documented.  Last Vitals:  Vitals:   01/20/23 0947 01/20/23 1047  BP: 124/71 107/72  Pulse: 96 90  Resp: 20 18  Temp:  36.9 C  SpO2: 93% 97%    Last Pain:  Vitals:   01/20/23 1047  TempSrc: Oral  PainSc:                  Linton Rump

## 2023-01-20 NOTE — Progress Notes (Signed)
PROGRESS NOTE    Christopher [name]  UJW:[id number] DOB: [birth year] DOA: 01/19/2023 PCP: Danella Penton, MD   Brief Narrative:  Christopher [name] is a 72 y.o. male with medical history significant for CKD stage III, morbid obesity, hypertension, hyperlipidemia, COPD on oxygen as needed being admitted to the hospital with left distal femur fracture.  Patient is wheelchair-bound at baseline, walks a few steps with rolling walker at home.  Lives with his daughter who helps take care of him.  He has been in his usual state of health until late last night when he slipped getting out of the shower.  Immediate left knee pain and inability to bear any weight.  Was brought to the ED at [facility] where x-rays showed a distal femur fracture.  Orthopedic surgery was consulted, recommended transfer to [facility] for orthopedic trauma evaluation.    Assessment and Plan: No notes have been filed under this hospital service. Service: Hospitalist  Closed left subtrochanteric femur fracture, initial encounter (HCC) status post open reduction internal fixation of left supracondylar distal femur fracture -Inpatient admission to MedSurg -CT knee done and showed "Acute comminuted impacted fracture of the distal femoral metaphysis as described above. Old healed fracture of the proximal fibula. Tricompartmental osteoarthritis." -Received Tranexamic Acid  -Patient has a leukocytosis of 19.9 which is now improved -Pain control with oxycodone IR 5 mg every 4 hours as needed for moderate pain as well as IV Morphine 2 mg every 2 as needed severe pain -Nonweightbearing status currently and further weightbearing status per orthopedic surgery -Has been seen by orthopedic surgery and was taken for ORIF today -Was n.p.o. at midnight but now on a diet   Peripheral edema, suspect Diastolic Dysfunction -last echo in our system from 2022 with preserved EF indeterminate diastolic parameters.  -Chest x-ray with concern for possible pulmonary  edema. -Received a dose of IV Lasix x 1 yesterday -BNP was 72.1 but obese patient is not very short -Obtain 2D echo and this was done and showed an EF of 55 to 60% with grade 1 diastolic dysfunction -Strict I's and O's and Daily Weights.  Intake/Output Summary (Last 24 hours) at 01/20/2023 1703 Last data filed at 01/20/2023 1300 Gross per 24 hour  Intake 990 ml  Output 600 ml  Net 390 ml  -Continue to monitor for signs and symptoms of volume overload -Repeat CMP and chest x-ray in the a.m.   Sleep apnea with COPD overlap -Continue supplemental oxygen as needed and wean O2 as tolerated to room air -Continuous pulse oximetry maintain O2 saturation greater than 90% -SpO2: 95 % O2 Flow Rate (L/min): 2 L/min -No evidence of acute exacerbation -Continue Xopenex every 4 hours as needed shortness of breath -Will order CPAP for the evening  Hyperlipidemia -C/w Atorvastatin 20 mg po qHS  Hypothyroidism -Check TSH -C/w Levothyroxine 88 mcg po Daily  Benign Essential Hypertension -Was previously on midodrine at home, but this is normotensive -Continue to Monitor BP per Protocol -Last BP reading was 124/75  AF (Paroxysmal Atrial Fibrillation) (HCC) -Currently appears to be in sinus rhythm -C/w Metoprolol Tartrate 5 mg IV q6hprn HBP  CKD Stage 3b -BUN/Cr Trend: Recent Labs  Lab 01/19/23 0754 01/20/23 0130  BUN 57* 46*  CREATININE 1.75* 2.04*  -Avoid Nephrotoxic Medications, Contrast Dyes, Hypotension and Dehydration to Ensure Adequate Renal Perfusion and will need to Renally Adjust Meds -Continue to Monitor and Trend Renal Function carefully and repeat CMP in the AM   Morbid Obesity with BMI of 45.0-49.9,  adult [facility]) -Complicates overall prognosis and care -Estimated body mass index is 45.24 kg/m as calculated from the following:   Height as of this encounter: 6' (1.829 m).   Weight as of this encounter: 151.3 kg.  -Weight Loss and Dietary Counseling given  DVT prophylaxis:  SCDs Start: 01/19/23 1617    Code Status: Full Code Family Communication: No family currently at bedside  Disposition Plan:  Level of care: Med-Surg Status is: Inpatient Remains inpatient appropriate because: Needs further clinical improvement and needs PT and OT to further evaluate and treat for safe discharge disposition   Consultants:  Orthopedic surgery  Procedures:  Open reduction internal fixation of left supracondylar distal femur fracture by Dr. Jena Gauss  ECHOCARDIOGRAM IMPRESSIONS     1. Left ventricular ejection fraction, by estimation, is 55 to 60%. The  left ventricle has normal function. The left ventricle has no regional  wall motion abnormalities. Left ventricular diastolic parameters are  consistent with Grade I diastolic  dysfunction (impaired relaxation).   2. Right ventricular systolic function is normal. The right ventricular  size is mildly enlarged.   3. The mitral valve is grossly normal. Trivial mitral valve  regurgitation. No evidence of mitral stenosis.   4. The aortic valve is tricuspid. Aortic valve regurgitation is not  visualized. Aortic valve sclerosis/calcification is present, without any  evidence of aortic stenosis.   5. Aortic dilatation noted. There is mild dilatation of the ascending  aorta, measuring 40 mm.   6. The inferior vena cava is normal in size with greater than 50%  respiratory variability, suggesting right atrial pressure of 3 mmHg.   Comparison(s): No significant change from prior study.   FINDINGS   Left Ventricle: Left ventricular ejection fraction, by estimation, is 55  to 60%. The left ventricle has normal function. The left ventricle has no  regional wall motion abnormalities. The left ventricular internal cavity  size was normal in size. There is   no left ventricular hypertrophy. Left ventricular diastolic parameters  are consistent with Grade I diastolic dysfunction (impaired relaxation).   Right Ventricle: The  right ventricular size is mildly enlarged. Right  vetricular wall thickness was not well visualized. Right ventricular  systolic function is normal.   Left Atrium: Left atrial size was normal in size.   Right Atrium: Right atrial size was normal in size.   Pericardium: There is no evidence of pericardial effusion.   Mitral Valve: The mitral valve is grossly normal. Trivial mitral valve  regurgitation. No evidence of mitral valve stenosis.   Tricuspid Valve: The tricuspid valve is normal in structure. Tricuspid  valve regurgitation is not demonstrated.   Aortic Valve: The aortic valve is tricuspid. Aortic valve regurgitation is  not visualized. Aortic valve sclerosis/calcification is present, without  any evidence of aortic stenosis. Aortic valve peak gradient measures 8.8  mmHg.   Pulmonic Valve: The pulmonic valve was not well visualized. Pulmonic valve  regurgitation is trivial.   Aorta: Aortic dilatation noted. There is mild dilatation of the ascending  aorta, measuring 40 mm.   Venous: The inferior vena cava is normal in size with greater than 50%  respiratory variability, suggesting right atrial pressure of 3 mmHg.   IAS/Shunts: The atrial septum is grossly normal.     LEFT VENTRICLE  PLAX 2D  LVIDd:         6.00 cm   Diastology  LVIDs:         4.20 cm   LV e' medial:  8.08 cm/s  LV PW:         1.10 cm   LV E/e' medial:  7.8  LV IVS:        0.90 cm   LV e' lateral:   8.86 cm/s  LVOT diam:     2.50 cm   LV E/e' lateral: 7.1  LV SV:         79  LV SV Index:   30  LVOT Area:     4.91 cm     RIGHT VENTRICLE             IVC  RV S prime:     19.10 cm/s  IVC diam: 1.90 cm  TAPSE (M-mode): 2.8 cm   LEFT ATRIUM           Index        RIGHT ATRIUM           Index  LA diam:      3.60 cm 1.36 cm/m   RA Area:     19.40 cm  LA Vol (A2C): 52.0 ml 19.64 ml/m  RA Volume:   54.60 ml  20.62 ml/m  LA Vol (A4C): 47.9 ml 18.09 ml/m   AORTIC VALVE  AV Area (Vmax): 3.44 cm   AV Vmax:        148.00 cm/s  AV Peak Grad:   8.8 mmHg  LVOT Vmax:      103.57 cm/s  LVOT Vmean:     64.167 cm/s  LVOT VTI:       0.161 m    AORTA  Ao Root diam: 3.90 cm  Ao Asc diam:  4.00 cm   MITRAL VALVE  MV Area (PHT): 4.21 cm    SHUNTS  MV Decel Time: 180 msec    Systemic VTI:  0.16 m  MR Peak grad: 6.5 mmHg     Systemic Diam: 2.50 cm  MR Vmax:      127.00 cm/s  MV E velocity: 62.90 cm/s  MV A velocity: 88.60 cm/s  MV E/A ratio:  0.71   Antimicrobials:  Anti-infectives (From admission, onward)    None       Subjective: Seen and examined at bedside in the PACU and he was complaining of some knee pain.  Located on any chest pain or shortness of breath.  No nausea or vomiting.  No other concerns or complaints this time.  Objective: Vitals:   01/19/23 1534 01/19/23 1729 01/19/23 2111 01/20/23 0630  BP: 125/74  (!) 139/90 131/67  Pulse: 87  95 96  Resp: 17  18 19   Temp: 98.2 F (36.8 C)  98 F (36.7 C) 98.9 F (37.2 C)  TempSrc: Oral   Oral  SpO2:   98% 99%  Weight: (!) 151.3 kg     Height:  6' (1.829 m)      Intake/Output Summary (Last 24 hours) at 01/20/2023 0865 Last data filed at 01/20/2023 7846 Gross per 24 hour  Intake 50 ml  Output 550 ml  Net -500 ml   Filed Weights   01/19/23 1534  Weight: (!) 151.3 kg   Examination: Physical Exam:  Constitutional: WN/WD morbidly obese Caucasian male in no acute distress Respiratory: Diminished to auscultation bilaterally with some coarse breath sounds, no wheezing, rales, rhonchi or crackles. Normal respiratory effort and patient is not tachypenic. No accessory muscle use.  Wearing supplemental oxygen via nasal cannula Cardiovascular: RRR, no murmurs / rubs / gallops. S1 and S2 auscultated.  Left  leg is wrapped Abdomen: Soft, non-tender, distended secondary to body habitus. Bowel sounds positive.  GU: Deferred. Musculoskeletal: No clubbing / cyanosis of digits/nails. No joint deformity upper and lower  extremities.  Skin: No rashes, lesions, ulcers on limited skin evaluation. No induration; Warm and dry.  Neurologic: CN 2-12 grossly intact with no focal deficits. Romberg sign and cerebellar reflexes not assessed.  Psychiatric: Normal judgment and insight. Alert and oriented x 3. Normal mood and appropriate affect.   Data Reviewed: I have personally reviewed following labs and imaging studies  CBC: Recent Labs  Lab 01/19/23 0754 01/20/23 0130  WBC 19.9* 9.5  NEUTROABS 17.2*  --   HGB 13.4 13.6  HCT 44.2 43.9  MCV 85.7 85.9  PLT 258 229   Basic Metabolic Panel: Recent Labs  Lab 01/19/23 0754 01/20/23 0130  NA 140 139  K 5.0 4.2  CL 103 100  CO2 28 28  GLUCOSE 110* 104*  BUN 57* 46*  CREATININE 1.75* 2.04*  CALCIUM 9.3 8.8*   GFR: Estimated Creatinine Clearance: 49.6 mL/min (A) (by C-G formula based on SCr of 2.04 mg/dL (H)). Liver Function Tests: Recent Labs  Lab 01/19/23 0754  AST 19  ALT 11  ALKPHOS 55  BILITOT 0.6  PROT 6.8  ALBUMIN 3.9   No results for input(s): "LIPASE", "AMYLASE" in the last 168 hours. No results for input(s): "AMMONIA" in the last 168 hours. Coagulation Profile: No results for input(s): "INR", "PROTIME" in the last 168 hours. Cardiac Enzymes: No results for input(s): "CKTOTAL", "CKMB", "CKMBINDEX", "TROPONINI" in the last 168 hours. BNP (last 3 results) No results for input(s): "PROBNP" in the last 8760 hours. HbA1C: No results for input(s): "HGBA1C" in the last 72 hours. CBG: No results for input(s): "GLUCAP" in the last 168 hours. Lipid Profile: No results for input(s): "CHOL", "HDL", "LDLCALC", "TRIG", "CHOLHDL", "LDLDIRECT" in the last 72 hours. Thyroid Function Tests: No results for input(s): "TSH", "T4TOTAL", "FREET4", "T3FREE", "THYROIDAB" in the last 72 hours. Anemia Panel: No results for input(s): "VITAMINB12", "FOLATE", "FERRITIN", "TIBC", "IRON", "RETICCTPCT" in the last 72 hours. Sepsis Labs: No results for input(s):  "PROCALCITON", "LATICACIDVEN" in the last 168 hours.  Recent Results (from the past 240 hour(s))  Surgical PCR screen     Status: None   Collection Time: 01/19/23  4:22 PM   Specimen: Nasal Mucosa; Nasal Swab  Result Value Ref Range Status   MRSA, PCR NEGATIVE NEGATIVE Final   Staphylococcus aureus NEGATIVE NEGATIVE Final    Comment: (NOTE) The Xpert SA Assay (FDA approved for NASAL specimens in patients 24 years of age and older), is one component of a comprehensive surveillance program. It is not intended to diagnose infection nor to guide or monitor treatment. Performed at [facility], [address] [place], [place] [address]     Radiology Studies: CT Knee Left Wo Contrast  Result Date: 01/19/2023 CLINICAL DATA:  Left distal femur fracture after fall. EXAM: CT OF THE LEFT KNEE WITHOUT CONTRAST TECHNIQUE: Multidetector CT imaging of the left knee was performed according to the standard protocol. Multiplanar CT image reconstructions were also generated. RADIATION DOSE REDUCTION: This exam was performed according to the departmental dose-optimization program which includes automated exposure control, adjustment of the mA and/or kV according to patient size and/or use of iterative reconstruction technique. COMPARISON:  Left knee x-rays from same day. FINDINGS: Bones/Joint/Cartilage Acute comminuted fracture of the distal femoral metaphysis with 1.2 cm posterior displacement 3 cm of overriding. No additional acute fracture.  No dislocation. Old healed fracture of the proximal fibula. Moderate medial and mild-to-moderate patellofemoral joint space narrowing. Tricompartmental marginal osteophytes. Trace joint effusion. Ligaments Ligaments are suboptimally evaluated by CT. Muscles and Tendons Grossly intact. Soft tissue No fluid collection or hematoma.  No soft tissue mass. IMPRESSION: 1. Acute comminuted impacted fracture of the distal femoral metaphysis as described above. 2. Old healed  fracture of the proximal fibula. 3. Tricompartmental osteoarthritis. Electronically Signed   By: Obie Dredge M.D.   On: 01/19/2023 09:39   DG Chest Portable 1 View  Result Date: 01/19/2023 CLINICAL DATA:  Shortness of breath. EXAM: PORTABLE CHEST 1 VIEW COMPARISON:  10/17/2021 FINDINGS: Again noted are enlarged central vascular structures with slightly enlarged interstitial lung markings. Findings are concerning for pulmonary edema. Heart size is upper limits of normal and stable. Trachea is midline. Negative for a pneumothorax. No acute bone abnormality. IMPRESSION: Prominent central vascular structures with enlarged lung markings. Findings are concerning for pulmonary edema. Electronically Signed   By: Richarda Overlie M.D.   On: 01/19/2023 08:26   DG Knee Complete 4 Views Left  Result Date: 01/19/2023 CLINICAL DATA:  Knee pain.  Patient fell in the shower this morning. EXAM: LEFT KNEE - COMPLETE 4+ VIEW COMPARISON:  None Available. FINDINGS: Acute comminuted fracture through the distal femoral metaphysis. There is associated soft tissue swelling and probable knee hemarthrosis. Healed fracture of the proximal fibula is noted. Mild tricompartmental degenerative changes are also present. IMPRESSION: Comminuted fracture through the distal femoral metaphysis with associated soft tissue swelling and hemarthrosis. Remote healed proximal fibular fracture. Electronically Signed   By: Malachy Moan M.D.   On: 01/19/2023 06:38   DG Hip Unilat W or Wo Pelvis 2-3 Views Left  Result Date: 01/19/2023 CLINICAL DATA:  Fall, severe knee and hip pain EXAM: DG HIP (WITH OR WITHOUT PELVIS) 2-3V LEFT COMPARISON:  Concurrently obtained radiographs of the knee FINDINGS: No acute fracture or malalignment. 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 IMPRESSION: 1. No acute fracture or  malalignment. 2. Mild bilateral hip joint degenerative osteoarthritis. Electronically Signed   By: Malachy Moan M.D.   On: 01/19/2023 06:36    Scheduled Meds:  [MAR Hold] aspirin EC  81 mg Oral QHS   [MAR Hold] atorvastatin  20 mg Oral QHS   [MAR Hold] docusate sodium  100 mg Oral BID   [MAR Hold] enoxaparin (LOVENOX) injection  70 mg Subcutaneous Q24H   [MAR Hold] levothyroxine  88 mcg Oral Q0600   [MAR Hold] loratadine  10 mg Oral Daily   [MAR Hold] mupirocin ointment  1 Application Nasal BID   [MAR Hold] sertraline  50 mg Oral Daily   Continuous Infusions:   LOS: 1 day   Marguerita Merles, DO [facility] Available via Epic secure chat 7am-7pm After these hours, please refer to coverage provider listed on [email or web address] 01/20/2023, 8:34 AM

## 2023-01-20 NOTE — Anesthesia Procedure Notes (Addendum)
Procedure Name: Intubation Date/Time: 01/20/2023 7:57 AM  Performed by: Gus Puma, CRNAPre-anesthesia Checklist: Patient identified, Emergency Drugs available, Suction available and Patient being monitored Patient Re-evaluated:Patient Re-evaluated prior to induction Oxygen Delivery Method: Circle System Utilized Preoxygenation: Pre-oxygenation with 100% oxygen Induction Type: IV induction Ventilation: Oral airway inserted - appropriate to patient size and Mask ventilation with difficulty Laryngoscope Size: Glidescope and 4 Grade View: Grade I Tube type: Oral Tube size: 7.5 mm Number of attempts: 1 Airway Equipment and Method: Rigid stylet and Video-laryngoscopy Placement Confirmation: ETT inserted through vocal cords under direct vision, positive ETCO2 and breath sounds checked- equal and bilateral Secured at: 23 cm Tube secured with: Tape Dental Injury: Teeth and Oropharynx as per pre-operative assessment  Comments: Easy, atraumatic intubation with Glidescope S4 -- Grade I View  Difficult mask with OPA insertion -- large beard

## 2023-01-20 NOTE — Transfer of Care (Signed)
Immediate Anesthesia Transfer of Care Note  Patient: Christopher [name]  Procedure(s) Performed: OPEN REDUCTION INTERNAL FIXATION (ORIF) DISTAL FEMUR FRACTURE (Left)  Patient Location: PACU  Anesthesia Type:General  Level of Consciousness: drowsy and patient cooperative  Airway & Oxygen Therapy: Patient Spontanous Breathing and Patient connected to face mask oxygen  Post-op Assessment: Report given to RN and Post -op Vital signs reviewed and stable  Post vital signs: Reviewed and stable  Last Vitals:  Vitals Value Taken Time  BP 112/79 01/20/23 0917  Temp    Pulse 90 01/20/23 0919  Resp 20 01/20/23 0919  SpO2 98 % 01/20/23 0919  Vitals shown include unvalidated device data.  Last Pain:  Vitals:   01/20/23 0630  TempSrc: Oral  PainSc: 0-No pain         Complications: No notable events documented.

## 2023-01-20 NOTE — Addendum Note (Signed)
Addendum  created 01/20/23 1115 by Gus Puma, CRNA   Intraprocedure Meds edited

## 2023-01-20 NOTE — Op Note (Signed)
Orthopaedic Surgery Operative Note (CSN: [id number] ) Date of Surgery: 01/20/2023  Admit Date: 01/19/2023   Diagnoses: Pre-Op Diagnoses: Left supracondylar distal femur fracture  Post-Op Diagnosis: Same  Procedures: CPT 27511-Open reduction internal fixation of left supracondylar distal femur fracture  Surgeons : Primary: Roby Lofts, MD  Assistant: Ulyses Southward, PA-C  Location: OR 3   Anesthesia: General   Antibiotics: Ancef 3g preop with 1 gm vancomycin powder placed topically   Tourniquet time: None    Estimated Blood Loss: 75 mL  Complications:None   Specimens:None   Implants: Implant Name Type Inv. Item Serial No. Manufacturer Lot No. LRB No. Used Action  SCREW CORTICAL NCB 5.0X90MM - [id number] Screw SCREW CORTICAL NCB 5.0X90MM  [employer](ORTH,TRAU,[employer],SG) [id number] Left 1 Implanted  SCREW CORTICAL 5.0X95MM - [id number] Screw SCREW CORTICAL 5.0X95MM  [employer](ORTH,TRAU,BIO,SG) [id number] Left 1 Implanted  PLATE FEM DIST NCB PP - [id number] Plate PLATE FEM DIST NCB PP  [employer](ORTH,TRAU,BIO,SG)  Left 1 Implanted  SCREW NCB 5.0X46MM - [id number] Screw SCREW NCB 5.0X46MM  [employer](ORTH,TRAU,BIO,SG)  Left 2 Implanted  SCREW CORTICAL 5.0X95MM - [id number] Screw SCREW CORTICAL 5.0X95MM  [employer](ORTH,TRAU,BIO,SG) [id number] Left 2 Implanted  SCREW FEM ST NCB 5X100 - [id number] Screw SCREW FEM ST NCB 5X100  [employer](ORTH,TRAU,BIO,SG) [id number] Left 1 Implanted  SCREW NCB 4.9NA35T - [id number] Screw SCREW NCB 4.0WC37S  [employer](ORTH,TRAU,BIO,SG)  Left 1 Implanted  CAP LOCK NCB - [id number] Cap CAP LOCK NCB  [employer](ORTH,TRAU,BIO,SG)  Left 7 Implanted     Indications for Surgery: 72 year old male who sustained a ground-level fall with a extra-articular supracondylar distal femur fracture.  Due to the unstable nature of his injury I recommend proceeding with open reduction internal fixation.  Risk and benefits were discussed with the  patient and his son.  Risks included but not limited to bleeding, infection, malunion, nonunion, hardware failure, hardware irritation, nerve or blood vessel injury, DVT, even the possibility anesthetic complications.  They agreed to proceed with surgery and consent was obtained.  Operative Findings: 1.  Open reduction internal fixation of left supracondylar distal femur fracture using [employer] NCB distal femoral locking plate.  Procedure: The patient was identified in the preoperative holding area. Consent was confirmed with the patient and their family and all questions were answered. The operative extremity was marked after confirmation with the patient. he was then brought back to the operating room by our anesthesia colleagues.  He was placed under general anesthetic and carefully transferred over to radiolucent flattop table.  A bump was placed under his operative hip.  The left lower extremity was then prepped and draped in usual sterile fashion.  A timeout was performed to verify the patient, the procedure, and the extremity.  Preoperative antibiotics were dosed.  Fluoroscopic imaging showed the unstable nature of his injury.  The hip and knee were flexed over a triangle.  A lateral approach to the distal femur was carried down through skin and subcutaneous tissue.  I incised through the IT band and expose the lateral condyle of the femur.  I then mobilized the vastus lateralis to develop a plane for the plate.  I then chose a 12 hole [employer] NCB distal femoral locking plate and slid this submuscularly along the lateral cortex of the femur.  Alignment was obtained by placement of the triangle as well as traction by my assistant.  I then placed a 2.0 mm K wire distally to align the distal portion of the plate.  I then percutaneously placed a 3.3 mm drill bit proximally to align the proximal portion of the plate.  I then drilled and placed 5.0 millimeter screws distally to bring the plate  flush to bone.  I then drilled and placed 5.0 millimeter screws into the femoral shaft to align the fracture appropriately.  I then removed the 3.3 mm drill bit proximally and placed a 4.0 millimeter screw.  Locking caps were placed in all of 5.0 millimeter screws in the shaft and I removed the targeting arm.  I then returned to the distal segment and placed a total of five 5.0 millimeter screws in place locking caps in all of these.  Final fluoroscopic imaging was obtained.  The incision was copiously irrigated.  A gram of vancomycin powder was placed into the incisions.  A layered closure of 0 Vicryl, 2-0 Vicryl, 3-0 Monocryl and Dermabond was used to close the skin.  Sterile dressing was applied.  The patient was then awoke from anesthesia and taken to the PACU in stable condition.  Post Op Plan/Instructions: Patient will be weightbearing as tolerated to the left lower extremity.  He will receive postoperative Ancef.  He will receive Lovenox for DVT prophylaxis while inpatient and discharged on an oral DOAC.  Will have him mobilize with physical and Occupational Therapy.  I was present and performed the entire surgery.  Ulyses Southward, PA-C did assist me throughout the case. An assistant was necessary given the difficulty in approach, maintenance of reduction and ability to instrument the fracture.   Truitt Merle, MD [facility]

## 2023-01-20 NOTE — Interval H&P Note (Signed)
History and Physical Interval Note:  01/20/2023 7:30 AM  Christopher [name]  has presented today for surgery, with the diagnosis of DISTAL FEMUR FRACTURE.  The various methods of treatment have been discussed with the patient and family. After consideration of risks, benefits and other options for treatment, the patient has consented to  Procedure(s): OPEN REDUCTION INTERNAL FIXATION (ORIF) DISTAL FEMUR FRACTURE (Left) as a surgical intervention.  The patient's history has been reviewed, patient examined, no change in status, stable for surgery.  I have reviewed the patient's chart and labs.  Questions were answered to the patient's satisfaction.     Caryn Bee P Helmi Hechavarria

## 2023-01-21 ENCOUNTER — Encounter (HOSPITAL_COMMUNITY): Payer: Self-pay | Admitting: Student

## 2023-01-21 ENCOUNTER — Inpatient Hospital Stay (HOSPITAL_COMMUNITY): Payer: Medicare Other

## 2023-01-21 DIAGNOSIS — I480 Paroxysmal atrial fibrillation: Secondary | ICD-10-CM | POA: Diagnosis not present

## 2023-01-21 DIAGNOSIS — S7222XA Displaced subtrochanteric fracture of left femur, initial encounter for closed fracture: Secondary | ICD-10-CM | POA: Diagnosis not present

## 2023-01-21 DIAGNOSIS — I10 Essential (primary) hypertension: Secondary | ICD-10-CM | POA: Diagnosis not present

## 2023-01-21 LAB — CBC WITH DIFFERENTIAL/PLATELET
Abs Immature Granulocytes: 0.38 10*3/uL — ABNORMAL HIGH (ref 0.00–0.07)
Basophils Absolute: 0.1 10*3/uL (ref 0.0–0.1)
Basophils Relative: 1 %
Eosinophils Absolute: 0.1 10*3/uL (ref 0.0–0.5)
Eosinophils Relative: 1 %
HCT: 35 % — ABNORMAL LOW (ref 39.0–52.0)
Hemoglobin: 10.9 g/dL — ABNORMAL LOW (ref 13.0–17.0)
Immature Granulocytes: 3 %
Lymphocytes Relative: 7 %
Lymphs Abs: 0.8 10*3/uL (ref 0.7–4.0)
MCH: 26.5 pg (ref 26.0–34.0)
MCHC: 31.1 g/dL (ref 30.0–36.0)
MCV: 85 fL (ref 80.0–100.0)
Monocytes Absolute: 1.4 10*3/uL — ABNORMAL HIGH (ref 0.1–1.0)
Monocytes Relative: 12 %
Neutro Abs: 9.2 10*3/uL — ABNORMAL HIGH (ref 1.7–7.7)
Neutrophils Relative %: 76 %
Platelets: 209 10*3/uL (ref 150–400)
RBC: 4.12 MIL/uL — ABNORMAL LOW (ref 4.22–5.81)
RDW: 15.1 % (ref 11.5–15.5)
WBC: 11.9 10*3/uL — ABNORMAL HIGH (ref 4.0–10.5)
nRBC: 0 % (ref 0.0–0.2)

## 2023-01-21 LAB — COMPREHENSIVE METABOLIC PANEL
ALT: 8 U/L (ref 0–44)
AST: 12 U/L — ABNORMAL LOW (ref 15–41)
Albumin: 2.8 g/dL — ABNORMAL LOW (ref 3.5–5.0)
Alkaline Phosphatase: 44 U/L (ref 38–126)
Anion gap: 8 (ref 5–15)
BUN: 49 mg/dL — ABNORMAL HIGH (ref 8–23)
CO2: 28 mmol/L (ref 22–32)
Calcium: 8.2 mg/dL — ABNORMAL LOW (ref 8.9–10.3)
Chloride: 102 mmol/L (ref 98–111)
Creatinine, Ser: 1.89 mg/dL — ABNORMAL HIGH (ref 0.61–1.24)
GFR, Estimated: 37 mL/min — ABNORMAL LOW (ref >60)
Glucose, Bld: 128 mg/dL — ABNORMAL HIGH (ref 70–99)
Potassium: 4.3 mmol/L (ref 3.5–5.1)
Sodium: 138 mmol/L (ref 135–145)
Total Bilirubin: 0.6 mg/dL (ref 0.3–1.2)
Total Protein: 5.4 g/dL — ABNORMAL LOW (ref 6.5–8.1)

## 2023-01-21 LAB — MAGNESIUM: Magnesium: 2.2 mg/dL (ref 1.7–2.4)

## 2023-01-21 LAB — PHOSPHORUS: Phosphorus: 3.5 mg/dL (ref 2.5–4.6)

## 2023-01-21 MED ORDER — ACETAMINOPHEN 325 MG PO TABS: 650.0000 mg | ORAL_TABLET | Freq: Four times a day (QID) | ORAL | Status: DC | PRN

## 2023-01-21 MED ORDER — OXYCODONE HCL 5 MG PO TABS
5.0000 mg | ORAL_TABLET | ORAL | Status: DC | PRN
  Administered 2023-01-21 – 2023-01-22 (×4): 10 mg via ORAL
  Filled 2023-01-21 (×4): qty 2

## 2023-01-21 NOTE — Progress Notes (Signed)
Orthopaedic Trauma Progress Note  SUBJECTIVE: Patient doing fairly well this morning.  Pain is well-controlled.  Has not required much in the way of pain medication since surgery yesterday.  Has not been up out of bed yet since surgery.  Denies any numbness or tingling throughout the left lower extremity.  No chest pain. No SOB. No nausea/vomiting. No other complaints.   OBJECTIVE:  Vitals:   01/21/23 0503 01/21/23 0714  BP: 113/69 110/63  Pulse: 87 85  Resp: 18 18  Temp:  98.4 F (36.9 C)  SpO2: 99% 98%    General: Sitting up in bed, no acute distress Respiratory: No increased work of breathing.  Left lower extremity: Dressing clean, dry, intact.  No significant tenderness with palpation throughout the thigh.  No calf tenderness.  Ankle DF/PF intact.  Endorses sensation to light touch over the toes. + DP pulse  IMAGING: Stable post op imaging.   LABS:  Results for orders placed or performed during the hospital encounter of 01/19/23 (from the past 24 hour(s))  CBC with Differential/Platelet     Status: Abnormal   Collection Time: 01/21/23  3:07 AM  Result Value Ref Range   WBC 11.9 (H) 4.0 - 10.5 K/uL   RBC 4.12 (L) 4.22 - 5.81 MIL/uL   Hemoglobin 10.9 (L) 13.0 - 17.0 g/dL   HCT 16.1 (L) 09.6 - 04.5 %   MCV 85.0 80.0 - 100.0 fL   MCH 26.5 26.0 - 34.0 pg   MCHC 31.1 30.0 - 36.0 g/dL   RDW 40.9 81.1 - 91.4 %   Platelets 209 150 - 400 K/uL   nRBC 0.0 0.0 - 0.2 %   Neutrophils Relative % 76 %   Neutro Abs 9.2 (H) 1.7 - 7.7 K/uL   Lymphocytes Relative 7 %   Lymphs Abs 0.8 0.7 - 4.0 K/uL   Monocytes Relative 12 %   Monocytes Absolute 1.4 (H) 0.1 - 1.0 K/uL   Eosinophils Relative 1 %   Eosinophils Absolute 0.1 0.0 - 0.5 K/uL   Basophils Relative 1 %   Basophils Absolute 0.1 0.0 - 0.1 K/uL   Immature Granulocytes 3 %   Abs Immature Granulocytes 0.38 (H) 0.00 - 0.07 K/uL  Comprehensive metabolic panel     Status: Abnormal   Collection Time: 01/21/23  3:07 AM  Result Value Ref  Range   Sodium 138 135 - 145 mmol/L   Potassium 4.3 3.5 - 5.1 mmol/L   Chloride 102 98 - 111 mmol/L   CO2 28 22 - 32 mmol/L   Glucose, Bld 128 (H) 70 - 99 mg/dL   BUN 49 (H) 8 - 23 mg/dL   Creatinine, Ser 7.82 (H) 0.61 - 1.24 mg/dL   Calcium 8.2 (L) 8.9 - 10.3 mg/dL   Total Protein 5.4 (L) 6.5 - 8.1 g/dL   Albumin 2.8 (L) 3.5 - 5.0 g/dL   AST 12 (L) 15 - 41 U/L   ALT 8 0 - 44 U/L   Alkaline Phosphatase 44 38 - 126 U/L   Total Bilirubin 0.6 0.3 - 1.2 mg/dL   GFR, Estimated 37 (L) >60 mL/min   Anion gap 8 5 - 15  Magnesium     Status: None   Collection Time: 01/21/23  3:07 AM  Result Value Ref Range   Magnesium 2.2 1.7 - 2.4 mg/dL  Phosphorus     Status: None   Collection Time: 01/21/23  3:07 AM  Result Value Ref Range   Phosphorus 3.5 2.5 -  4.6 mg/dL    ASSESSMENT: Christopher [name] is a 72 y.o. male, 1 Day Post-Op s/p OPEN REDUCTION INTERNAL FIXATION LEFT DISTAL FEMUR FRACTURE  CV/Blood loss: Acute blood loss anemia, Hgb 10.9 this morning. Hemodynamically stable  PLAN: Weightbearing: WBAT LLE ROM: Okay for unrestricted ROM Incisional and dressing care: Reinforce dressings as needed  Showering: Hold off on showering for now.  Incisions may begin getting wet 01/23/2023 if no drainage noted. Orthopedic device(s): None  Pain management:  1. Tylenol 650 mg q 6 hours PRN 2. Robaxin 500 mg q 6 hours PRN 3. Oxycodone 5-10 mg q 4 hours PRN 4. Morphine 2 mg q 2 hours PRN VTE prophylaxis: Lovenox, SCDs ID:  Ancef 2gm post op Foley/Lines:  No foley, KVO IVFs Impediments to Fracture Healing: Vitamin D level pending, will start supplementation as indicated. Dispo: PT/OT evaluation today, dispo pending.  Plan to remove/change dressing LLE tomorrow 01/22/2023.    D/C recommendations: -Tylenol versus oxycodone for pain control -Eliquis 2.5 mg twice daily x 30 days for DVT prophylaxis -Possible need for Vit D supplementation  Follow - up plan: 2 weeks after discharge for wound check and  repeat x-rays   Contact information:  Truitt Merle MD, Thyra Breed PA-C. After hours and holidays please check [email or web address] for group call information for [facility]   Thompson Caul, PA-C [phone] (office) [email or web address]

## 2023-01-21 NOTE — Evaluation (Signed)
Occupational Therapy Evaluation Patient Details Name: Christopher [name] MRN: [id number] DOB: 1951-05-08 Today's Date: 01/21/2023   History of Present Illness 72 y.o. male [facility] to [facility] hospital on 01/19/2023 with L distal femur fx after fall at home in shower. Pt underwent ORIF of L distal femur fx on 5/30. PMH includes anxiety, CHF, COPD, depression, HLD, HTN.   Clinical Impression   Patient admitted for the diagnosis above.  PTA he lives with his daughter and her SO.  Patient did require some assist with lower body ADL, but was able to stand pivot to/from surfaces with RW, and complete the majority of his ADL.  Patient had assist with community mobility and iADL.  Currently L leg pain is the deficit, and he is needing Max A for ADL at bedlevel, and Max A for basic transfers.  OT is indicated in the acute setting to address deficits, and Patient will benefit from continued inpatient follow up therapy, <3 hours/day.  Patient does not have the required assist at home to transition directly there.        Recommendations for follow up therapy are one component of a multi-disciplinary discharge planning process, led by the attending physician.  Recommendations may be updated based on patient status, additional functional criteria and insurance authorization.   Assistance Recommended at Discharge Frequent or constant Supervision/Assistance  Patient can return home with the following Help with stairs or ramp for entrance;Two people to help with walking and/or transfers;Assistance with cooking/housework;Assist for transportation;A lot of help with bathing/dressing/bathroom    Functional Status Assessment  Patient has had a recent decline in their functional status and demonstrates the ability to make significant improvements in function in a reasonable and predictable amount of time.  Equipment Recommendations  None recommended by OT    Recommendations for Other Services       Precautions /  Restrictions Precautions Precautions: Fall Restrictions Weight Bearing Restrictions: Yes LLE Weight Bearing: Weight bearing as tolerated      Mobility Bed Mobility Overal bed mobility: Needs Assistance Bed Mobility: Supine to Sit, Sit to Supine     Supine to sit: Max assist, HOB elevated Sit to supine: Max assist, HOB elevated     Patient Response: Cooperative  Transfers                   General transfer comment: unable      Balance Overall balance assessment: Needs assistance Sitting-balance support: No upper extremity supported, Feet supported Sitting balance-Leahy Scale: Fair   Postural control: Posterior lean                                 ADL either performed or assessed with clinical judgement   ADL Overall ADL's : Needs assistance/impaired Eating/Feeding: Set up;Bed level   Grooming: Wash/dry hands;Wash/dry face;Set up;Bed level   Upper Body Bathing: Minimal assistance;Bed level   Lower Body Bathing: Maximal assistance;Bed level   Upper Body Dressing : Minimal assistance;Bed level   Lower Body Dressing: Maximal assistance;Bed level       Toileting- Clothing Manipulation and Hygiene: Maximal assistance;Bed level               Vision Baseline Vision/History: 1 Wears glasses Patient Visual Report: No change from baseline       Perception     Praxis      Pertinent Vitals/Pain Pain Assessment Pain Assessment: Faces Faces Pain Scale: Hurts whole lot Pain Location:  knee Pain Descriptors / Indicators: Grimacing, Guarding, Sharp Pain Intervention(s): Monitored during session     Hand Dominance Right   Extremity/Trunk Assessment Upper Extremity Assessment Upper Extremity Assessment: Overall WFL for tasks assessed   Lower Extremity Assessment Lower Extremity Assessment: Defer to PT evaluation   Cervical / Trunk Assessment Cervical / Trunk Assessment: Other exceptions Cervical / Trunk Exceptions: morbid obesity    Communication Communication Communication: No difficulties   Cognition Arousal/Alertness: Awake/alert Behavior During Therapy: Flat affect Overall Cognitive Status: Within Functional Limits for tasks assessed                                                        Home Living Family/patient expects to be discharged to:: Private residence Living Arrangements: Children Available Help at Discharge: Family;Available 24 hours/day Type of Home: House Home Access: Stairs to enter [employer] of Steps: 3 Entrance Stairs-Rails: Can reach both Home Layout: Multi-level;Able to live on main level with bedroom/bathroom     Bathroom Shower/Tub: Producer, television/film/video: Standard Bathroom Accessibility: No   Home Equipment: Agricultural consultant (2 wheels);Wheelchair - Careers adviser (comment);Shower seat;BSC/3in1          Prior Functioning/Environment Prior Level of Function : Needs assist             Mobility Comments: ambulates for short household distances with RW, otherwise MWC ADLs Comments: assist with LB dressing and IADLs, sedentary.        OT Problem List: Decreased strength;Decreased range of motion;Decreased activity tolerance;Impaired balance (sitting and/or standing);Pain      OT Treatment/Interventions: Self-care/ADL training;Therapeutic activities;Patient/family education;DME and/or AE instruction;Balance training    OT Goals(Current goals can be found in the care plan section) Acute Rehab OT Goals Patient Stated Goal: Move better OT Goal Formulation: With patient Time For Goal Achievement: 02/07/23 Potential to Achieve Goals: Good ADL Goals Pt Will Perform Grooming: with set-up;sitting Pt Will Perform Upper Body Bathing: with set-up;sitting Pt Will Perform Upper Body Dressing: with set-up;sitting Pt Will Transfer to Toilet: with max assist;with transfer board;bedside commode  OT Frequency: Min 2X/week    Co-evaluation               AM-PAC OT "6 Clicks" Daily Activity     Outcome Measure Help from another person eating meals?: None Help from another person taking care of personal grooming?: None Help from another person toileting, which includes using toliet, bedpan, or urinal?: A Lot Help from another person bathing (including washing, rinsing, drying)?: A Lot Help from another person to put on and taking off regular upper body clothing?: A Little Help from another person to put on and taking off regular lower body clothing?: A Lot 6 Click Score: 17   End of Session Equipment Utilized During Treatment: Oxygen Nurse Communication: Mobility status  Activity Tolerance: Patient limited by pain Patient left: in bed;with call bell/phone within reach;with family/visitor present  OT Visit Diagnosis: Muscle weakness (generalized) (M62.81);Pain Pain - Right/Left: Left Pain - part of body: Leg;Knee                Time: 4098-1191 OT Time Calculation (min): 22 min Charges:  OT General Charges $OT Visit: 1 Visit OT Evaluation $OT Eval Moderate Complexity: 1 Mod  01/21/2023  RP, OTR/L  Acute Rehabilitation Services  Office:  [phone]  Florabel Faulks D Boby Eyer 01/21/2023, 5:15 PM

## 2023-01-21 NOTE — NC FL2 (Signed)
[place] MEDICAID FL2 LEVEL OF CARE FORM     IDENTIFICATION  Patient Name: Christopher [name] Birthdate: [birth year] Sex: male Admission Date (Current Location): 01/19/2023  [facility] and [place] Number:  Producer, television/film/video and Address:  [facility],  [address] [place], [place] [address]      Provider Number: [id number]  Attending Physician Name and Address:  Merlene Laughter, DO  Relative Name and Phone Number:  [place], Henrich   [phone]    Current Level of Care: Hospital Recommended Level of Care: Skilled Nursing Facility Prior Approval Number:    Date Approved/Denied:   PASRR Number: [id number]  Discharge Plan: SNF    Current Diagnoses: Patient Active Problem List   Diagnosis Date Noted   Closed left subtrochanteric femur fracture, initial encounter (HCC) 01/19/2023   CKD (chronic kidney disease) stage 4, GFR 15-29 ml/min (HCC) 01/26/2022   Generalized weakness 10/16/2021   Chronic respiratory failure with hypoxia (HCC) 10/16/2021   Hypotension    SIRS (systemic inflammatory response syndrome) (HCC)    AF (paroxysmal atrial fibrillation) (HCC)    Syncope    Severe sepsis (HCC) 05/20/2021   Pressure injury of skin 04/25/2021   Acute respiratory failure (HCC) 04/21/2021   Acute kidney injury superimposed on CKD (HCC) 04/21/2021   Stage 3b chronic kidney disease (CKD) (HCC) 03/22/2021   Leukocytosis 03/22/2021   Osteomyelitis of ankle or foot, acute, left (HCC) 03/17/2021   Acute on chronic respiratory failure (HCC) 03/17/2021   Osteomyelitis (HCC) 12/16/2020   Depression 12/16/2020   Medicare annual wellness visit, initial 11/14/2019   B12 deficiency 11/14/2019   Hyperlipidemia 10/31/2019   Primary osteoarthritis of right knee 03/16/2019   Sleep apnea 10/31/2018   Traumatic incomplete tear of left rotator cuff 10/02/2018   Morbid obesity with BMI of 45.0-49.9, adult (HCC) 09/20/2018   Hypothyroidism 09/20/2018   Chronic diastolic CHF  (congestive heart failure) (HCC) 09/20/2018   Benign essential hypertension 09/20/2018    Orientation RESPIRATION BLADDER Height & Weight     Self, Time, Situation, Place  O2 Continent Weight: (!) 333 lb 8.9 oz (151.3 kg) Height:  6' (182.9 cm)  BEHAVIORAL SYMPTOMS/MOOD NEUROLOGICAL BOWEL NUTRITION STATUS      Continent Diet (see discharge summary)  AMBULATORY STATUS COMMUNICATION OF NEEDS Skin   Total Care Verbally Surgical wounds                       Personal Care Assistance Level of Assistance  Bathing, Feeding, Dressing Bathing Assistance: Maximum assistance Feeding assistance: Limited assistance Dressing Assistance: Maximum assistance     Functional Limitations Info  Sight, Hearing, Speech Sight Info: Adequate Hearing Info: Adequate Speech Info: Adequate    SPECIAL CARE FACTORS FREQUENCY  PT (By licensed PT), OT (By licensed OT)     PT Frequency: 5x week OT Frequency: 5x week            Contractures Contractures Info: Not present    Additional Factors Info  Code Status, Allergies Code Status Info: full Allergies Info: Propranolol, Albuterol, Gramineae Pollens, Other           Current Medications (01/21/2023):  This is the current hospital active medication list Current Facility-Administered Medications  Medication Dose Route Frequency Provider Last Rate Last Admin   0.9 %  sodium chloride infusion   Intravenous Continuous West Bali, PA-C   Stopped at 01/20/23 1555   acetaminophen (TYLENOL) tablet 650 mg  650 mg Oral Q6H PRN Thyra Breed  A, PA-C       alum & mag hydroxide-simeth (MAALOX/MYLANTA) 200-200-20 MG/5ML suspension 15 mL  15 mL Oral Q6H PRN West Bali, PA-C   15 mL at 01/20/23 4132   aspirin EC tablet 81 mg  81 mg Oral QHS West Bali, PA-C   81 mg at 01/20/23 2216   atorvastatin (LIPITOR) tablet 20 mg  20 mg Oral QHS West Bali, PA-C   20 mg at 01/20/23 2216   docusate sodium (COLACE) capsule 100 mg  100 mg Oral  BID West Bali, PA-C   100 mg at 01/21/23 0752   enoxaparin (LOVENOX) injection 70 mg  70 mg Subcutaneous Q24H West Bali, PA-C   70 mg at 01/21/23 0753   levalbuterol (XOPENEX) nebulizer solution 0.63 mg  0.63 mg Inhalation Q4H PRN West Bali, PA-C       levothyroxine (SYNTHROID) tablet 88 mcg  88 mcg Oral Q0600 West Bali, PA-C   88 mcg at 01/21/23 0548   loratadine (CLARITIN) tablet 10 mg  10 mg Oral Daily West Bali, PA-C   10 mg at 01/21/23 0753   methocarbamol (ROBAXIN) tablet 500 mg  500 mg Oral Q6H PRN West Bali, PA-C   500 mg at 01/21/23 4401   Or   methocarbamol (ROBAXIN) 500 mg in dextrose 5 % 50 mL IVPB  500 mg Intravenous Q6H PRN West Bali, PA-C       metoCLOPramide (REGLAN) tablet 5-10 mg  5-10 mg Oral Q8H PRN Sharon Seller, Sarah A, PA-C       Or   metoCLOPramide (REGLAN) injection 5-10 mg  5-10 mg Intravenous Q8H PRN Sharon Seller, Sarah A, PA-C       metoprolol tartrate (LOPRESSOR) injection 5 mg  5 mg Intravenous Q6H PRN Sharon Seller, Sarah A, PA-C       morphine (PF) 2 MG/ML injection 2 mg  2 mg Intravenous Q2H PRN Sharon Seller, Sarah A, PA-C       mupirocin ointment (BACTROBAN) 2 % 1 Application  1 Application Nasal BID West Bali, PA-C   1 Application at 01/21/23 0803   ondansetron (ZOFRAN) tablet 4 mg  4 mg Oral Q6H PRN West Bali, PA-C       Or   ondansetron (ZOFRAN) injection 4 mg  4 mg Intravenous Q6H PRN Thyra Breed A, PA-C       oxyCODONE (Oxy IR/ROXICODONE) immediate release tablet 5-10 mg  5-10 mg Oral Q4H PRN West Bali, PA-C   10 mg at 01/21/23 0857   polyethylene glycol (MIRALAX / GLYCOLAX) packet 17 g  17 g Oral Daily PRN Thyra Breed A, PA-C       sertraline (ZOLOFT) tablet 50 mg  50 mg Oral Daily Thyra Breed A, PA-C   50 mg at 01/21/23 0272     Discharge Medications: Please see discharge summary for a list of discharge medications.  Relevant Imaging Results:  Relevant Lab Results:   Additional  Information SSN:[id number]  Lorri Frederick, LCSW

## 2023-01-21 NOTE — Evaluation (Signed)
Physical Therapy Evaluation Patient Details Name: Christopher [name] MRN: [id number] DOB: 1950-12-28 Today's Date: 01/21/2023  History of Present Illness  72 y.o. male presents to [facility] hospital on 01/19/2023 with L distal femur fx after fall at home in shower. Pt underwent ORIF of L distal femur fx on 5/30. PMH includes anxiety, CHF, COPD, depression, HLD, HTN.  Clinical Impression  Pt presents to PT with deficits in functional mobility, gait, balance, strength, power, endurance. Pt requires significant assistance to mobilize in the bed, pt typically sleeping in a lift chair at home. Pt is unable to ascend into standing at this time due to weakness and L knee pain despite significant physical assist from PT. PT encourages LLE exercise for strengthening and ROM. PT recommends short term inpatient PT services at the time of discharge.       Recommendations for follow up therapy are one component of a multi-disciplinary discharge planning process, led by the attending physician.  Recommendations may be updated based on patient status, additional functional criteria and insurance authorization.  Follow Up Recommendations Can patient physically be transported by private vehicle: No     Assistance Recommended at Discharge Frequent or constant Supervision/Assistance  Patient can return home with the following  Two people to help with walking and/or transfers;Two people to help with bathing/dressing/bathroom;Assistance with cooking/housework;Assist for transportation;Help with stairs or ramp for entrance    Equipment Recommendations  (TBD pending progress)  Recommendations for Other Services       Functional Status Assessment Patient has had a recent decline in their functional status and demonstrates the ability to make significant improvements in function in a reasonable and predictable amount of time.     Precautions / Restrictions Precautions Precautions: Fall Restrictions Weight Bearing  Restrictions: Yes LLE Weight Bearing: Weight bearing as tolerated      Mobility  Bed Mobility Overal bed mobility: Needs Assistance Bed Mobility: Supine to Sit, Sit to Supine     Supine to sit: Max assist, HOB elevated Sit to supine: Max assist, HOB elevated        Transfers Overall transfer level: Needs assistance Equipment used: Rolling walker (2 wheels) Transfers: Sit to/from Stand Sit to Stand:  (attempted sit to stand x2 with RW and once with face to face, only able to clear buttocks with face to face transfer but unable to extend hips/knees fully to stand)                Ambulation/Gait                  Stairs            Wheelchair Mobility    Modified Rankin (Stroke Patients Only)       Balance Overall balance assessment: Needs assistance Sitting-balance support: No upper extremity supported, Feet supported Sitting balance-Leahy Scale: Good                                       Pertinent Vitals/Pain Pain Assessment Pain Assessment: Faces Faces Pain Scale: Hurts little more Pain Location: knee Pain Descriptors / Indicators: Sore Pain Intervention(s): Monitored during session    Home Living Family/patient expects to be discharged to:: Private residence Living Arrangements: Children Available Help at Discharge: Family;Available 24 hours/day Type of Home: House Home Access: Stairs to enter Entrance Stairs-Rails: Can reach both Entrance Stairs-Number of Steps: 3   Home Layout: Multi-level;Able to live on  main level with bedroom/bathroom Home Equipment: Rolling Walker (2 wheels);Wheelchair - Careers adviser (comment);Shower seat;BSC/3in1 (knee scooter)      Prior Function Prior Level of Function : Needs assist             Mobility Comments: ambulates for short household distances with RW, otherwise MWC ADLs Comments: assist with LB dressing and IADLs     Hand Dominance        Extremity/Trunk Assessment    Upper Extremity Assessment Upper Extremity Assessment: Overall WFL for tasks assessed    Lower Extremity Assessment Lower Extremity Assessment: RLE deficits/detail;LLE deficits/detail RLE Deficits / Details: RLE grossly 4-/5 LLE Deficits / Details: L ankle PF/DF 3/5, knee flexion/extension 2-/5, pt with chronic LLE hip external rotation    Cervical / Trunk Assessment Cervical / Trunk Assessment: Other exceptions Cervical / Trunk Exceptions: morbid obesity  Communication   Communication: No difficulties  Cognition Arousal/Alertness: Awake/alert Behavior During Therapy: WFL for tasks assessed/performed Overall Cognitive Status: Within Functional Limits for tasks assessed                                          General Comments General comments (skin integrity, edema, etc.): VSS on RA    Exercises     Assessment/Plan    PT Assessment Patient needs continued PT services  PT Problem List Decreased strength;Decreased range of motion;Decreased activity tolerance;Decreased balance;Decreased mobility;Decreased knowledge of use of DME;Pain       PT Treatment Interventions DME instruction;Gait training;Stair training;Functional mobility training;Therapeutic activities;Therapeutic exercise;Balance training;Neuromuscular re-education;Patient/family education    PT Goals (Current goals can be found in the Care Plan section)  Acute Rehab PT Goals Patient Stated Goal: to reduce pain and assistance requirements when mobilizing PT Goal Formulation: With patient Time For Goal Achievement: 02/04/23 Potential to Achieve Goals: Fair    Frequency Min 3X/week     Co-evaluation               AM-PAC PT "6 Clicks" Mobility  Outcome Measure Help needed turning from your back to your side while in a flat bed without using bedrails?: A Lot Help needed moving from lying on your back to sitting on the side of a flat bed without using bedrails?: A Lot Help needed moving to  and from a bed to a chair (including a wheelchair)?: Total Help needed standing up from a chair using your arms (e.g., wheelchair or bedside chair)?: Total Help needed to walk in hospital room?: Total Help needed climbing 3-5 steps with a railing? : Total 6 Click Score: 8    End of Session   Activity Tolerance: Patient tolerated treatment well Patient left: in bed;with call bell/phone within reach;with bed alarm set Nurse Communication: Mobility status;Need for lift equipment PT Visit Diagnosis: Other abnormalities of gait and mobility (R26.89);Muscle weakness (generalized) (M62.81);Pain Pain - Right/Left: Left Pain - part of body: Knee    Time: 0830-0910 PT Time Calculation (min) (ACUTE ONLY): 40 min   Charges:   PT Evaluation $PT Eval Low Complexity: 1 Low PT Treatments $Therapeutic Activity: 8-22 mins        Arlyss Gandy, PT, DPT Acute Rehabilitation Office [phone]   Arlyss Gandy 01/21/2023, 9:53 AM

## 2023-01-21 NOTE — Progress Notes (Signed)
PROGRESS NOTE    Christopher [name]  ZOX:[id number] DOB: 11/04/1950 DOA: 01/19/2023 PCP: Danella Penton, MD   Brief Narrative:  Christopher [name] is a 72 y.o. male with medical history significant for CKD stage III, morbid obesity, hypertension, hyperlipidemia, COPD on oxygen as needed being admitted to the hospital with left distal femur fracture.  Patient is wheelchair-bound at baseline, walks a few steps with rolling walker at home.  Lives with his daughter who helps take care of him.  He has been in his usual state of health until late last night when he slipped getting out of the shower.  Immediate left knee pain and inability to bear any weight.  Was brought to the ED at [facility] where x-rays showed a distal femur fracture.  Orthopedic surgery was consulted, recommended transfer to [facility] for orthopedic trauma evaluation.      Assessment and Plan: No notes have been filed under this hospital service. Service: Hospitalist  Closed left subtrochanteric femur fracture, initial encounter (HCC) status post open reduction internal fixation of left supracondylar distal femur fracture POD 1 -Inpatient admission to MedSurg -CT knee done and showed "Acute comminuted impacted fracture of the distal femoral metaphysis as described above. Old healed fracture of the proximal fibula. Tricompartmental osteoarthritis." -Received Tranexamic Acid  -Patient has a leukocytosis of 19.9 which is now improved -Pain control with oxycodone IR 5 mg every 4 hours as needed for moderate pain as well as IV Morphine 2 mg every 2 as needed severe pain has not been adjusted and acetaminophen 650 mg p.o./RC every 6 as needed has been added and oxycodone has been increased to 5 to 10 mg every 4 as needed  -Nonweightbearing status currently and further weightbearing status per orthopedic surgery -Has been seen by orthopedic surgery and was taken for ORIF on 01/20/23 -Was n.p.o. at midnight but now on a diet -Orthopedic surgery  recommending weightbearing as tolerated left lower extremity and okay for unrestricted range of motion -For discharge they are recommending acetaminophen versus oxycodone for pain control and recommending Eliquis 2.5 mg twice daily for 30 days for DVT prophylaxis -They are planning to remove and change the patient's left lower extremity dressings on 01/22/2023 and recommending Lovenox for VTE prophylaxis for now -PT OT recommending SNF and TOC consulted for assistance with placement as patient is agreeable   Peripheral edema in the setting of Acute on Chronic Diastolic Dysfunction -last echo in our system from 2022 with preserved EF indeterminate diastolic parameters.   -Chest x-ray with concern for possible pulmonary edema. -Received a dose of IV Lasix x 1 the day before yesterday -BNP was 72.1 but obese patient is not very short -Obtain 2D echo and this was done and showed an EF of 55 to 60% with grade 1 diastolic dysfunction -Strict I's and O's and Daily Weights.  Intake/Output Summary (Last 24 hours) at 01/21/2023 1758 Last data filed at 01/21/2023 1549 Gross per 24 hour  Intake 1035.01 ml  Output 920 ml  Net 115.01 ml  -Continue to monitor for signs and symptoms of volume overload -Repeat CMP in the AM -CXR done and showed "The mediastinal contours are within normal limits. No cardiomegaly. There is mild cephalization of the pulmonary vasculature and prominence of the vascular structures about the right hilum, slightly decreased from comparison. No focal consolidations, pleural effusion, or pneumothorax. No acute osseous abnormality."   Sleep apnea with COPD overlap -Continue supplemental oxygen as needed and wean O2 as tolerated to room air -Continuous  pulse oximetry maintain O2 saturation greater than 90% SpO2: 95 % O2 Flow Rate (L/min): 2 L/min -No evidence of acute exacerbation -Continue Xopenex every 4 hours as needed shortness of breath -Will order CPAP for the evening    Hyperlipidemia -C/w Atorvastatin 20 mg po qHS   Hypothyroidism -Check TSH in the a.m. -C/w Levothyroxine 88 mcg po Daily  Leukocytosis -In the setting of surgery and likely reactive. WBC Trend: Recent Labs  Lab 01/19/23 0754 01/20/23 0130 01/21/23 0307  WBC 19.9* 9.5 11.9*  -Continue to Monitor for S/Sx of Infection and Repeat CBC in the AM   Benign Essential Hypertension -Was previously on midodrine at home, but this is normotensive -Continue to Monitor BP per Protocol -Last BP reading was 124/75   AF (Paroxysmal Atrial Fibrillation) (HCC) -Currently appears to be in sinus rhythm -C/w Metoprolol Tartrate 5 mg IV q6hprn HBP -If necessary will place on telemetry   CKD Stage 3b -BUN/Cr Trend: Recent Labs  Lab 01/19/23 0754 01/20/23 0130 01/21/23 0307  BUN 57* 46* 49*  CREATININE 1.75* 2.04* 1.89*  -Avoid Nephrotoxic Medications, Contrast Dyes, Hypotension and Dehydration to Ensure Adequate Renal Perfusion and will need to Renally Adjust Meds -Continue to Monitor and Trend Renal Function carefully and repeat CMP in the AM   Acute blood loss anemia as a postoperative cause -Hgb/Hct Trend: Recent Labs  Lab 01/19/23 0754 01/20/23 0130 01/21/23 0307  HGB 13.4 13.6 10.9*  HCT 44.2 43.9 35.0*  MCV 85.7 85.9 85.0  -Check anemia panel in the a.m. Continue to monitor for signs and symptoms of bleeding; no overt bleeding noted -Patient having his dressing change tomorrow -Repeat CBC in a.m.  Hypoalbuminemia -Patient's Albumin Trend: Recent Labs  Lab 01/19/23 0754 01/21/23 0307  ALBUMIN 3.9 2.8*  -Continue to Monitor and Trend and repeat CMP in the AM    Morbid Obesity with BMI of 45.0-49.9, adult (HCC) -Complicates overall prognosis and care -Estimated body mass index is 45.24 kg/m as calculated from the following:   Height as of this encounter: 6' (1.829 m).   Weight as of this encounter: 151.3 kg.  -Weight Loss and Dietary Counseling given  DVT  prophylaxis: SCDs Start: 01/20/23 1029 SCDs Start: 01/19/23 1617    Code Status: Full Code Family Communication: No family currently at bedside  Disposition Plan:  Level of care: Med-Surg Status is: Inpatient Remains inpatient appropriate because: Needs further clinical improvement and will need to go to SNF   Consultants:  Orthopedic Surgery  Procedures:  Procedures: CPT 27511-Open reduction internal fixation of left supracondylar distal femur fracture  ECHOCARDIOGRAM IMPRESSIONS     1. Left ventricular ejection fraction, by estimation, is 55 to 60%. The  left ventricle has normal function. The left ventricle has no regional  wall motion abnormalities. Left ventricular diastolic parameters are  consistent with Grade I diastolic  dysfunction (impaired relaxation).   2. Right ventricular systolic function is normal. The right ventricular  size is mildly enlarged.   3. The mitral valve is grossly normal. Trivial mitral valve  regurgitation. No evidence of mitral stenosis.   4. The aortic valve is tricuspid. Aortic valve regurgitation is not  visualized. Aortic valve sclerosis/calcification is present, without any  evidence of aortic stenosis.   5. Aortic dilatation noted. There is mild dilatation of the ascending  aorta, measuring 40 mm.   6. The inferior vena cava is normal in size with greater than 50%  respiratory variability, suggesting right atrial pressure of 3 mmHg.  Comparison(s): No significant change from prior study.   FINDINGS   Left Ventricle: Left ventricular ejection fraction, by estimation, is 55  to 60%. The left ventricle has normal function. The left ventricle has no  regional wall motion abnormalities. The left ventricular internal cavity  size was normal in size. There is   no left ventricular hypertrophy. Left ventricular diastolic parameters  are consistent with Grade I diastolic dysfunction (impaired relaxation).   Right Ventricle: The right  ventricular size is mildly enlarged. Right  vetricular wall thickness was not well visualized. Right ventricular  systolic function is normal.   Left Atrium: Left atrial size was normal in size.   Right Atrium: Right atrial size was normal in size.   Pericardium: There is no evidence of pericardial effusion.   Mitral Valve: The mitral valve is grossly normal. Trivial mitral valve  regurgitation. No evidence of mitral valve stenosis.   Tricuspid Valve: The tricuspid valve is normal in structure. Tricuspid  valve regurgitation is not demonstrated.   Aortic Valve: The aortic valve is tricuspid. Aortic valve regurgitation is  not visualized. Aortic valve sclerosis/calcification is present, without  any evidence of aortic stenosis. Aortic valve peak gradient measures 8.8  mmHg.   Pulmonic Valve: The pulmonic valve was not well visualized. Pulmonic valve  regurgitation is trivial.   Aorta: Aortic dilatation noted. There is mild dilatation of the ascending  aorta, measuring 40 mm.   Venous: The inferior vena cava is normal in size with greater than 50%  respiratory variability, suggesting right atrial pressure of 3 mmHg.   IAS/Shunts: The atrial septum is grossly normal.     LEFT VENTRICLE  PLAX 2D  LVIDd:         6.00 cm   Diastology  LVIDs:         4.20 cm   LV e' medial:    8.08 cm/s  LV PW:         1.10 cm   LV E/e' medial:  7.8  LV IVS:        0.90 cm   LV e' lateral:   8.86 cm/s  LVOT diam:     2.50 cm   LV E/e' lateral: 7.1  LV SV:         79  LV SV Index:   30  LVOT Area:     4.91 cm     RIGHT VENTRICLE             IVC  RV S prime:     19.10 cm/s  IVC diam: 1.90 cm  TAPSE (M-mode): 2.8 cm   LEFT ATRIUM           Index        RIGHT ATRIUM           Index  LA diam:      3.60 cm 1.36 cm/m   RA Area:     19.40 cm  LA Vol (A2C): 52.0 ml 19.64 ml/m  RA Volume:   54.60 ml  20.62 ml/m  LA Vol (A4C): 47.9 ml 18.09 ml/m   AORTIC VALVE  AV Area (Vmax): 3.44 cm  AV  Vmax:        148.00 cm/s  AV Peak Grad:   8.8 mmHg  LVOT Vmax:      103.57 cm/s  LVOT Vmean:     64.167 cm/s  LVOT VTI:       0.161 m    AORTA  Ao Root diam: 3.90  cm  Ao Asc diam:  4.00 cm   MITRAL VALVE  MV Area (PHT): 4.21 cm    SHUNTS  MV Decel Time: 180 msec    Systemic VTI:  0.16 m  MR Peak grad: 6.5 mmHg     Systemic Diam: 2.50 cm  MR Vmax:      127.00 cm/s  MV E velocity: 62.90 cm/s  MV A velocity: 88.60 cm/s  MV E/A ratio:  0.71     Antimicrobials:  Anti-infectives (From admission, onward)    Start     Dose/Rate Route Frequency Ordered Stop   01/20/23 1400  ceFAZolin (ANCEF) IVPB 2g/100 mL premix        2 g 200 mL/hr over 30 Minutes Intravenous Every 8 hours 01/20/23 1028 01/21/23 0620   01/20/23 0841  vancomycin (VANCOCIN) powder  Status:  Discontinued          As needed 01/20/23 9147 01/20/23 0911       Subjective: Seen and examined at bedside he is resting and thinks he is doing okay.  Was having some pain in his leg.  No nausea or vomiting.  States that he needed "a lot of help" when working with therapy.  No other concerns or complaints at this time.  Objective: Vitals:   01/20/23 1311 01/20/23 2112 01/21/23 0503 01/21/23 0714  BP: 124/75 129/79 113/69 110/63  Pulse: 93 92 87 85  Resp: 18 17 18 18   Temp: 98 F (36.7 C) 98.3 F (36.8 C)  98.4 F (36.9 C)  TempSrc:  Oral  Oral  SpO2: 95% 99% 99% 98%  Weight:      Height:        Intake/Output Summary (Last 24 hours) at 01/21/2023 8295 Last data filed at 01/21/2023 0550 Gross per 24 hour  Intake 1255.01 ml  Output 520 ml  Net 735.01 ml   Filed Weights   01/19/23 1534  Weight: (!) 151.3 kg   Examination: Physical Exam:  Constitutional: WN/WD morbidly obese Caucasian male in no acute distress Respiratory: Diminished to auscultation bilaterally, no wheezing, rales, rhonchi or crackles. Normal respiratory effort and patient is not tachypenic. No accessory muscle use.  Unlabored  breathing Cardiovascular: RRR, no murmurs / rubs / gallops. S1 and S2 auscultated.  Left leg is wrapped Abdomen: Soft, non-tender, distended secondary to body habitus.  Bowel sounds positive.  GU: Deferred. Musculoskeletal: No clubbing / cyanosis of digits/nails.  Left leg is wrapped Skin: No rashes, lesions, ulcers on limited skin evaluation. No induration; Warm and dry.  Neurologic: CN 2-12 grossly intact with no focal deficits. Romberg sign and cerebellar reflexes not assessed.  Psychiatric: Normal judgment and insight. Alert and oriented x 3. Normal mood and appropriate affect.   Data Reviewed: I have personally reviewed following labs and imaging studies  CBC: Recent Labs  Lab 01/19/23 0754 01/20/23 0130 01/21/23 0307  WBC 19.9* 9.5 11.9*  NEUTROABS 17.2*  --  9.2*  HGB 13.4 13.6 10.9*  HCT 44.2 43.9 35.0*  MCV 85.7 85.9 85.0  PLT 258 229 209   Basic Metabolic Panel: Recent Labs  Lab 01/19/23 0754 01/20/23 0130 01/21/23 0307  NA 140 139 138  K 5.0 4.2 4.3  CL 103 100 102  CO2 28 28 28   GLUCOSE 110* 104* 128*  BUN 57* 46* 49*  CREATININE 1.75* 2.04* 1.89*  CALCIUM 9.3 8.8* 8.2*  MG  --   --  2.2  PHOS  --   --  3.5   GFR:  Estimated Creatinine Clearance: 53.5 mL/min (A) (by C-G formula based on SCr of 1.89 mg/dL (H)). Liver Function Tests: Recent Labs  Lab 01/19/23 0754 01/21/23 0307  AST 19 12*  ALT 11 8  ALKPHOS 55 44  BILITOT 0.6 0.6  PROT 6.8 5.4*  ALBUMIN 3.9 2.8*   No results for input(s): "LIPASE", "AMYLASE" in the last 168 hours. No results for input(s): "AMMONIA" in the last 168 hours. Coagulation Profile: No results for input(s): "INR", "PROTIME" in the last 168 hours. Cardiac Enzymes: No results for input(s): "CKTOTAL", "CKMB", "CKMBINDEX", "TROPONINI" in the last 168 hours. BNP (last 3 results) No results for input(s): "PROBNP" in the last 8760 hours. HbA1C: No results for input(s): "HGBA1C" in the last 72 hours. CBG: No results for  input(s): "GLUCAP" in the last 168 hours. Lipid Profile: No results for input(s): "CHOL", "HDL", "LDLCALC", "TRIG", "CHOLHDL", "LDLDIRECT" in the last 72 hours. Thyroid Function Tests: No results for input(s): "TSH", "T4TOTAL", "FREET4", "T3FREE", "THYROIDAB" in the last 72 hours. Anemia Panel: No results for input(s): "VITAMINB12", "FOLATE", "FERRITIN", "TIBC", "IRON", "RETICCTPCT" in the last 72 hours. Sepsis Labs: No results for input(s): "PROCALCITON", "LATICACIDVEN" in the last 168 hours.  Recent Results (from the past 240 hour(s))  Surgical PCR screen     Status: None   Collection Time: 01/19/23  4:22 PM   Specimen: Nasal Mucosa; Nasal Swab  Result Value Ref Range Status   MRSA, PCR NEGATIVE NEGATIVE Final   Staphylococcus aureus NEGATIVE NEGATIVE Final    Comment: (NOTE) The Xpert SA Assay (FDA approved for NASAL specimens in patients 14 years of age and older), is one component of a comprehensive surveillance program. It is not intended to diagnose infection nor to guide or monitor treatment. Performed at [facility], [address] [place], [place] [address]      Radiology Studies: DG CHEST PORT 1 VIEW  Result Date: 01/21/2023 CLINICAL DATA:  72 year old male with history of shortness of breath. EXAM: PORTABLE CHEST - 1 VIEW COMPARISON:  01/19/2023, 10/17/2021 FINDINGS: The mediastinal contours are within normal limits. No cardiomegaly. There is mild cephalization of the pulmonary vasculature and prominence of the vascular structures about the right hilum, slightly decreased from comparison. No focal consolidations, pleural effusion, or pneumothorax. No acute osseous abnormality. IMPRESSION: Mild pulmonary vascular prominence without evidence of pulmonary edema. Electronically Signed   By: Marliss Coots M.D.   On: 01/21/2023 07:40   ECHOCARDIOGRAM COMPLETE  Result Date: 01/20/2023    ECHOCARDIOGRAM REPORT   Patient Name:   Christopher [name] Date of Exam: 01/20/2023  Medical Rec #:  [id number]      Height:       72.0 in Accession #:    [id number]     Weight:       333.6 lb Date of Birth:  07-14-1951      BSA:          2.648 m Patient Age:    71 years       BP:           124/75 mmHg Patient Gender: M              HR:           93 bpm. Exam Location:  Inpatient Procedure: 2D Echo Indications:    Lower extremity edema                 Chronic Diastolic Heart Failure  History:  Patient has prior history of Echocardiogram examinations. CHF;                 Signs/Symptoms:Edema.  Sonographer:    Lucendia Herrlich Referring Phys: [id number] SARAH A MCCLUNG IMPRESSIONS  1. Left ventricular ejection fraction, by estimation, is 55 to 60%. The left ventricle has normal function. The left ventricle has no regional wall motion abnormalities. Left ventricular diastolic parameters are consistent with Grade I diastolic dysfunction (impaired relaxation).  2. Right ventricular systolic function is normal. The right ventricular size is mildly enlarged.  3. The mitral valve is grossly normal. Trivial mitral valve regurgitation. No evidence of mitral stenosis.  4. The aortic valve is tricuspid. Aortic valve regurgitation is not visualized. Aortic valve sclerosis/calcification is present, without any evidence of aortic stenosis.  5. Aortic dilatation noted. There is mild dilatation of the ascending aorta, measuring 40 mm.  6. The inferior vena cava is normal in size with greater than 50% respiratory variability, suggesting right atrial pressure of 3 mmHg. Comparison(s): No significant change from prior study. FINDINGS  Left Ventricle: Left ventricular ejection fraction, by estimation, is 55 to 60%. The left ventricle has normal function. The left ventricle has no regional wall motion abnormalities. The left ventricular internal cavity size was normal in size. There is  no left ventricular hypertrophy. Left ventricular diastolic parameters are consistent with Grade I diastolic dysfunction (impaired  relaxation). Right Ventricle: The right ventricular size is mildly enlarged. Right vetricular wall thickness was not well visualized. Right ventricular systolic function is normal. Left Atrium: Left atrial size was normal in size. Right Atrium: Right atrial size was normal in size. Pericardium: There is no evidence of pericardial effusion. Mitral Valve: The mitral valve is grossly normal. Trivial mitral valve regurgitation. No evidence of mitral valve stenosis. Tricuspid Valve: The tricuspid valve is normal in structure. Tricuspid valve regurgitation is not demonstrated. Aortic Valve: The aortic valve is tricuspid. Aortic valve regurgitation is not visualized. Aortic valve sclerosis/calcification is present, without any evidence of aortic stenosis. Aortic valve peak gradient measures 8.8 mmHg. Pulmonic Valve: The pulmonic valve was not well visualized. Pulmonic valve regurgitation is trivial. Aorta: Aortic dilatation noted. There is mild dilatation of the ascending aorta, measuring 40 mm. Venous: The inferior vena cava is normal in size with greater than 50% respiratory variability, suggesting right atrial pressure of 3 mmHg. IAS/Shunts: The atrial septum is grossly normal.  LEFT VENTRICLE PLAX 2D LVIDd:         6.00 cm   Diastology LVIDs:         4.20 cm   LV e' medial:    8.08 cm/s LV PW:         1.10 cm   LV E/e' medial:  7.8 LV IVS:        0.90 cm   LV e' lateral:   8.86 cm/s LVOT diam:     2.50 cm   LV E/e' lateral: 7.1 LV SV:         79 LV SV Index:   30 LVOT Area:     4.91 cm  RIGHT VENTRICLE             IVC RV S prime:     19.10 cm/s  IVC diam: 1.90 cm TAPSE (M-mode): 2.8 cm LEFT ATRIUM           Index        RIGHT ATRIUM           Index LA diam:  3.60 cm 1.36 cm/m   RA Area:     19.40 cm LA Vol (A2C): 52.0 ml 19.64 ml/m  RA Volume:   54.60 ml  20.62 ml/m LA Vol (A4C): 47.9 ml 18.09 ml/m  AORTIC VALVE AV Area (Vmax): 3.44 cm AV Vmax:        148.00 cm/s AV Peak Grad:   8.8 mmHg LVOT Vmax:       103.57 cm/s LVOT Vmean:     64.167 cm/s LVOT VTI:       0.161 m  AORTA Ao Root diam: 3.90 cm Ao Asc diam:  4.00 cm MITRAL VALVE MV Area (PHT): 4.21 cm    SHUNTS MV Decel Time: 180 msec    Systemic VTI:  0.16 m MR Peak grad: 6.5 mmHg     Systemic Diam: 2.50 cm MR Vmax:      127.00 cm/s MV E velocity: 62.90 cm/s MV A velocity: 88.60 cm/s MV E/A ratio:  0.71 Laurance Flatten MD Electronically signed by Laurance Flatten MD Signature Date/Time: 01/20/2023/4:23:16 PM    Final    DG Knee Left Port  Result Date: 01/20/2023 CLINICAL DATA:  Fracture, postop ORIF. EXAM: PORTABLE LEFT KNEE - 1-2 VIEW COMPARISON:  Preoperative imaging FINDINGS: Lateral plate and multi screw fixation of comminuted distal femur fracture. Improved fracture alignment from preoperative imaging. Remote proximal fibular fracture is unchanged. Recent postsurgical change includes air and edema in the soft tissues. IMPRESSION: ORIF of comminuted distal femur fracture with improved fracture alignment from preoperative imaging. Electronically Signed   By: Narda Rutherford M.D.   On: 01/20/2023 12:56   DG FEMUR MIN 2 VIEWS LEFT  Result Date: 01/20/2023 CLINICAL DATA:  Left femur ORIF.  Intraoperative fluoroscopy. EXAM: LEFT FEMUR 2 VIEWS COMPARISON:  Left hip and left knee radiographs 01/19/2023 FINDINGS: Images were performed intraoperatively without the presence of a radiologist. The patient is undergoing lateral plate and screw fixation of the previously seen markedly comminuted and moderately displaced distal femoral metaphyseal fracture. Total fluoroscopy images: 6 Total fluoroscopy time: 55 seconds Total dose: Radiation Exposure Index (as provided by the fluoroscopic device): 4.99 mGy air Kerma Please see intraoperative findings for further detail. IMPRESSION: Intraoperative fluoroscopy for ORIF of the distal femoral fracture. Electronically Signed   By: Neita Garnet M.D.   On: 01/20/2023 09:06   DG C-Arm 1-60 Min-No Report  Result Date:  01/20/2023 Fluoroscopy was utilized by the requesting physician.  No radiographic interpretation.   CT Knee Left Wo Contrast  Result Date: 01/19/2023 CLINICAL DATA:  Left distal femur fracture after fall. EXAM: CT OF THE LEFT KNEE WITHOUT CONTRAST TECHNIQUE: Multidetector CT imaging of the left knee was performed according to the standard protocol. Multiplanar CT image reconstructions were also generated. RADIATION DOSE REDUCTION: This exam was performed according to the departmental dose-optimization program which includes automated exposure control, adjustment of the mA and/or kV according to patient size and/or use of iterative reconstruction technique. COMPARISON:  Left knee x-rays from same day. FINDINGS: Bones/Joint/Cartilage Acute comminuted fracture of the distal femoral metaphysis with 1.2 cm posterior displacement 3 cm of overriding. No additional acute fracture. No dislocation. Old healed fracture of the proximal fibula. Moderate medial and mild-to-moderate patellofemoral joint space narrowing. Tricompartmental marginal osteophytes. Trace joint effusion. Ligaments Ligaments are suboptimally evaluated by CT. Muscles and Tendons Grossly intact. Soft tissue No fluid collection or hematoma.  No soft tissue mass. IMPRESSION: 1. Acute comminuted impacted fracture of the distal femoral metaphysis as described above. 2. Old healed fracture  of the proximal fibula. 3. Tricompartmental osteoarthritis. Electronically Signed   By: Obie Dredge M.D.   On: 01/19/2023 09:39   DG Chest Portable 1 View  Result Date: 01/19/2023 CLINICAL DATA:  Shortness of breath. EXAM: PORTABLE CHEST 1 VIEW COMPARISON:  10/17/2021 FINDINGS: Again noted are enlarged central vascular structures with slightly enlarged interstitial lung markings. Findings are concerning for pulmonary edema. Heart size is upper limits of normal and stable. Trachea is midline. Negative for a pneumothorax. No acute bone abnormality. IMPRESSION:  Prominent central vascular structures with enlarged lung markings. Findings are concerning for pulmonary edema. Electronically Signed   By: Richarda Overlie M.D.   On: 01/19/2023 08:26    Scheduled Meds:  aspirin EC  81 mg Oral QHS   atorvastatin  20 mg Oral QHS   docusate sodium  100 mg Oral BID   enoxaparin (LOVENOX) injection  70 mg Subcutaneous Q24H   levothyroxine  88 mcg Oral Q0600   loratadine  10 mg Oral Daily   mupirocin ointment  1 Application Nasal BID   sertraline  50 mg Oral Daily   Continuous Infusions:  sodium chloride Stopped (01/20/23 1555)   methocarbamol (ROBAXIN) IV      LOS: 2 days   Marguerita Merles, DO [facility] Available via Epic secure chat 7am-7pm After these hours, please refer to coverage provider listed on [email or web address] 01/21/2023, 8:11 AM

## 2023-01-21 NOTE — Care Management Important Message (Signed)
Important Message  Patient Details  Name: Babu Gohn MRN: [id number] Date of Birth: 10/03/1950   Medicare Important Message Given:  Yes     Sherilyn Banker 01/21/2023, 3:04 PM

## 2023-01-21 NOTE — TOC Initial Note (Signed)
Transition of Care [facility]) - Initial/Assessment Note    Patient Details  Name: Christopher [name] MRN: [id number] Date of Birth: August 27, 1950  Transition of Care [facility]) CM/SW Contact:    Lorri Frederick, LCSW Phone Number: 01/21/2023, 1:45 PM  Clinical Narrative:     CSW met with pt regarding PT recommendation for SNF.  Pt is agreeable but still hopeful that he will improve and not need SNF.  Permission given to send out referral in hub, medicare choice document provided.  Pt lives at home, daughter and her boyfriend also in the home.  No current services.  He was at [facility] in [place] several years ago, cannot recall the name, will find out and let CSW know later.  Referral sent out in hub for SNF.              Expected Discharge Plan: Skilled Nursing Facility Barriers to Discharge: Continued Medical Work up, SNF Pending bed offer   Patient Goals and CMS Choice Patient states their goals for this hospitalization and ongoing recovery are:: be able to get around CMS Medicare.gov Compare Post Acute Care list provided to:: Patient Choice offered to / list presented to : Patient      Expected Discharge Plan and Services In-house Referral: Clinical Social Work   Post Acute Care Choice: Skilled Nursing Facility Living arrangements for the past 2 months: Single Family Home                                      Prior Living Arrangements/Services Living arrangements for the past 2 months: Single Family Home Lives with:: Adult Children (lives with daughter Silva Bandy and her boyfriend) Patient language and need for interpreter reviewed:: Yes Do you feel safe going back to the place where you live?: Yes      Need for Family Participation in Patient Care: Yes (Comment) Care giver support system in place?: Yes (comment) Current home services: Other (comment) (none) Criminal Activity/Legal Involvement Pertinent to Current Situation/Hospitalization: No - Comment as needed  Activities of  Daily Living Home Assistive Devices/Equipment: Wheelchair, Medical laboratory scientific officer (specify quad or straight) ADL Screening (condition at time of admission) Patient's cognitive ability adequate to safely complete daily activities?: Yes Is the patient deaf or have difficulty hearing?: Yes (has hearing aids at home) Does the patient have difficulty seeing, even when wearing glasses/contacts?: No Does the patient have difficulty concentrating, remembering, or making decisions?: No Patient able to express need for assistance with ADLs?: Yes Does the patient have difficulty dressing or bathing?: Yes Independently performs ADLs?: No Communication: Independent Dressing (OT): Needs assistance Is this a change from baseline?: Pre-admission baseline Grooming: Independent Feeding: Independent Bathing: Needs assistance Is this a change from baseline?: Change from baseline, expected to last <3 days Toileting: Needs assistance Is this a change from baseline?: Change from baseline, expected to last <3 days In/Out Bed: Needs assistance Is this a change from baseline?: Change from baseline, expected to last <3 days Walks in Home: Needs assistance Is this a change from baseline?: Change from baseline, expected to last <3 days Does the patient have difficulty walking or climbing stairs?: Yes Weakness of Legs: Both Weakness of Arms/Hands: None  Permission Sought/Granted Permission sought to share information with : Family Supports Permission granted to share information with : Yes, Verbal Permission Granted  Share Information with NAME: daughter Silva Bandy, son Barbara Cower  Emotional Assessment Appearance:: Appears stated age Attitude/Demeanor/Rapport: Engaged Affect (typically observed): Appropriate, Pleasant Orientation: : Oriented to Self, Oriented to Place, Oriented to  Time, Oriented to Situation      Admission diagnosis:  Closed left subtrochanteric femur fracture, initial encounter [facility])  [S72.22XA] Patient Active Problem List   Diagnosis Date Noted   Closed left subtrochanteric femur fracture, initial encounter (HCC) 01/19/2023   CKD (chronic kidney disease) stage 4, GFR 15-29 ml/min (HCC) 01/26/2022   Generalized weakness 10/16/2021   Chronic respiratory failure with hypoxia (HCC) 10/16/2021   Hypotension    SIRS (systemic inflammatory response syndrome) (HCC)    AF (paroxysmal atrial fibrillation) (HCC)    Syncope    Severe sepsis (HCC) 05/20/2021   Pressure injury of skin 04/25/2021   Acute respiratory failure (HCC) 04/21/2021   Acute kidney injury superimposed on CKD (HCC) 04/21/2021   Stage 3b chronic kidney disease (CKD) (HCC) 03/22/2021   Leukocytosis 03/22/2021   Osteomyelitis of ankle or foot, acute, left (HCC) 03/17/2021   Acute on chronic respiratory failure (HCC) 03/17/2021   Osteomyelitis (HCC) 12/16/2020   Depression 12/16/2020   Medicare annual wellness visit, initial 11/14/2019   B12 deficiency 11/14/2019   Hyperlipidemia 10/31/2019   Primary osteoarthritis of right knee 03/16/2019   Sleep apnea 10/31/2018   Traumatic incomplete tear of left rotator cuff 10/02/2018   Morbid obesity with BMI of 45.0-49.9, adult (HCC) 09/20/2018   Hypothyroidism 09/20/2018   Chronic diastolic CHF (congestive heart failure) (HCC) 09/20/2018   Benign essential hypertension 09/20/2018   PCP:  Danella Penton, MD Pharmacy:   [id number] [place], [place] - [address] AT [facility] [address] [place] [address] Phone: [phone] Fax: [phone]     Social Determinants of Health (SDOH) Social History: SDOH Screenings   Food Insecurity: No Food Insecurity (01/19/2023)  Housing: Low Risk  (01/19/2023)  Transportation Needs: No Transportation Needs (01/19/2023)  Utilities: Not At Risk (01/19/2023)  Depression (PHQ2-9): Low Risk  (10/20/2022)  Tobacco Use: Low Risk  (01/21/2023)   SDOH Interventions:      Readmission Risk Interventions    04/26/2021   10:16 AM  Readmission Risk Prevention Plan  Transportation Screening Complete  PCP or Specialist Appt within 3-5 Days Complete  HRI or Home Care Consult Complete  Social Work Consult for Recovery Care Planning/Counseling Complete  Palliative Care Screening Not Applicable  Medication Review Oceanographer) Complete

## 2023-01-22 DIAGNOSIS — I10 Essential (primary) hypertension: Secondary | ICD-10-CM | POA: Diagnosis not present

## 2023-01-22 DIAGNOSIS — I480 Paroxysmal atrial fibrillation: Secondary | ICD-10-CM | POA: Diagnosis not present

## 2023-01-22 DIAGNOSIS — S7222XA Displaced subtrochanteric fracture of left femur, initial encounter for closed fracture: Secondary | ICD-10-CM | POA: Diagnosis not present

## 2023-01-22 LAB — COMPREHENSIVE METABOLIC PANEL
ALT: 5 U/L (ref 0–44)
AST: 13 U/L — ABNORMAL LOW (ref 15–41)
Albumin: 2.8 g/dL — ABNORMAL LOW (ref 3.5–5.0)
Alkaline Phosphatase: 38 U/L (ref 38–126)
Anion gap: 8 (ref 5–15)
BUN: 47 mg/dL — ABNORMAL HIGH (ref 8–23)
CO2: 28 mmol/L (ref 22–32)
Calcium: 8.2 mg/dL — ABNORMAL LOW (ref 8.9–10.3)
Chloride: 102 mmol/L (ref 98–111)
Creatinine, Ser: 1.83 mg/dL — ABNORMAL HIGH (ref 0.61–1.24)
GFR, Estimated: 39 mL/min — ABNORMAL LOW (ref >60)
Glucose, Bld: 106 mg/dL — ABNORMAL HIGH (ref 70–99)
Potassium: 4.3 mmol/L (ref 3.5–5.1)
Sodium: 138 mmol/L (ref 135–145)
Total Bilirubin: 0.5 mg/dL (ref 0.3–1.2)
Total Protein: 5.4 g/dL — ABNORMAL LOW (ref 6.5–8.1)

## 2023-01-22 LAB — CBC
HCT: 33.8 % — ABNORMAL LOW (ref 39.0–52.0)
Hemoglobin: 10.2 g/dL — ABNORMAL LOW (ref 13.0–17.0)
MCH: 25.7 pg — ABNORMAL LOW (ref 26.0–34.0)
MCHC: 30.2 g/dL (ref 30.0–36.0)
MCV: 85.1 fL (ref 80.0–100.0)
Platelets: 203 10*3/uL (ref 150–400)
RBC: 3.97 MIL/uL — ABNORMAL LOW (ref 4.22–5.81)
RDW: 15.2 % (ref 11.5–15.5)
WBC: 9.6 10*3/uL (ref 4.0–10.5)
nRBC: 0 % (ref 0.0–0.2)

## 2023-01-22 LAB — PHOSPHORUS: Phosphorus: 3.6 mg/dL (ref 2.5–4.6)

## 2023-01-22 LAB — MAGNESIUM: Magnesium: 2.1 mg/dL (ref 1.7–2.4)

## 2023-01-22 MED ORDER — ACETAMINOPHEN 325 MG PO TABS: 325.0000 mg | ORAL_TABLET | Freq: Four times a day (QID) | ORAL | Status: DC | PRN

## 2023-01-22 MED ORDER — NALOXONE HCL 0.4 MG/ML IJ SOLN: 0.4000 mg | INTRAMUSCULAR | Status: DC | PRN

## 2023-01-22 MED ORDER — ACETAMINOPHEN 325 MG PO TABS
650.0000 mg | ORAL_TABLET | Freq: Four times a day (QID) | ORAL | Status: DC
  Filled 2023-01-22: qty 2

## 2023-01-22 MED ORDER — HYDROCODONE-ACETAMINOPHEN 5-325 MG PO TABS
1.0000 | ORAL_TABLET | Freq: Four times a day (QID) | ORAL | Status: DC | PRN
  Administered 2023-01-22 – 2023-01-23 (×3): 1 via ORAL
  Administered 2023-01-24 (×2): 2 via ORAL
  Filled 2023-01-22: qty 1
  Filled 2023-01-22 (×2): qty 2
  Filled 2023-01-22 (×2): qty 1

## 2023-01-22 MED ORDER — FENTANYL CITRATE PF 50 MCG/ML IJ SOSY
12.5000 ug | PREFILLED_SYRINGE | INTRAMUSCULAR | Status: DC | PRN
  Administered 2023-01-22: 12.5 ug via INTRAVENOUS
  Filled 2023-01-22: qty 1

## 2023-01-22 NOTE — Hospital Course (Addendum)
Christopher [name] is a 72 y.o. male with medical history significant for CKD stage III, morbid obesity, hypertension, hyperlipidemia, COPD on oxygen as needed being admitted to the hospital with left distal femur fracture.  Patient is wheelchair-bound at baseline, walks a few steps with rolling walker at home.  Lives with his daughter who helps take care of him.  He has been in his usual state of health until late last night when he slipped getting out of the shower.  Immediate left knee pain and inability to bear any weight.  Was brought to the ED at [facility] where x-rays showed a distal femur fracture.  Orthopedic surgery was consulted, recommended transfer to [facility] for orthopedic trauma evaluation.    He underwent an open reduction internal fixation of a left supracondylar distal femur fracture on 01/20/2023 and is postoperative day 5.  PT OT evaluated and recommending SNF and patient's was given bed offers and he accepted [facility] and the facility can take the patient today on 01/25/2023.  Pain is fairly well-controlled.   He is medically stable for D/C and will need to follow up with PCP and Orthopedic Surgery within 1-2 weeks.   Assessment and Plan:  Closed left subtrochanteric femur fracture, initial encounter (HCC) status post open reduction internal fixation of left supracondylar distal femur fracture POD 5 -Inpatient admission to MedSurg -CT knee done and showed "Acute comminuted impacted fracture of the distal femoral metaphysis as described above. Old healed fracture of the proximal fibula. Tricompartmental osteoarthritis." -Received Tranexamic Acid  -Patient has a leukocytosis of 19.9 which is now improved -Pain control with Acetaminophen 325-650 mg po q6hprn Mild Pain, Hydrocodone-Acetaminophen 1-2 tabs orally every 6 hours as needed for moderate-severe pain and have changed his IV morphine to IV fentanyl 12.5-25 mcg every 2 hours as needed for breakthrough pain -Nonweightbearing status  currently and further weightbearing status per orthopedic surgery -Has been seen by orthopedic surgery and was taken for ORIF on 01/20/23 -Was n.p.o. at midnight but now on a diet -Orthopedic surgery recommending weightbearing as tolerated left lower extremity and okay for unrestricted range of motion -For discharge they are recommending acetaminophen versus oxycodone for pain control and recommending Eliquis 2.5 mg twice daily for 30 days for DVT prophylaxis -Orthopedic Surgery removed and changed the patient's left lower extremity dressings on 01/22/2023 and recommending Lovenox for VTE prophylaxis for now -PT OT recommending SNF and TOC consulted for assistance with placement as patient is agreeable given his lack of mobility    Peripheral Edema in the setting of Acute on Chronic Diastolic Dysfunction, improved -last echo in our system from 2022 with preserved EF indeterminate diastolic parameters.   -Chest x-ray with concern for possible pulmonary edema. -Received a dose of IV Lasix on the day of Admission -BNP was 72.1 but obese patient is not very short -Obtain 2D echo and this was done and showed an EF of 55 to 60% with grade 1 diastolic dysfunction -Strict I's and O's and Daily Weights.  Intake/Output Summary (Last 24 hours) at 01/25/2023 1121 Last data filed at 01/25/2023 0900 Gross per 24 hour  Intake 240 ml  Output 600 ml  Net -360 ml   -Continue to monitor for signs and symptoms of volume overload -Repeat CMP in the AM -CXR done 01/20/23 and showed "The mediastinal contours are within normal limits. No cardiomegaly. There is mild cephalization of the pulmonary vasculature and prominence of the vascular structures about the right hilum, slightly decreased from comparison. No focal  consolidations, pleural effusion, or pneumothorax. No acute osseous abnormality." -Repeat CXR done and showed "Similar cardiomediastinal silhouette. Both lungs are clear. No visible pleural effusions or  pneumothorax. No acute osseous abnormality."   Sleep Apnea with COPD overlap -Continue supplemental oxygen as needed and wean O2 as tolerated to room air -Continuous pulse oximetry maintain O2 saturation greater than 90% SpO2: 98 % O2 Flow Rate (L/min): 2 L/min -No evidence of acute exacerbation -Continue Xopenex every 4 hours as needed shortness of breath -Ordered CPAP qHS   Hyperlipidemia -C/w Atorvastatin 20 mg po qHS   Hypothyroidism -Checked TSH was 3.519 -C/w Levothyroxine 88 mcg po Daily   Leukocytosis -In the setting of surgery and likely reactive. WBC Trend: Recent Labs  Lab 01/19/23 0754 01/20/23 0130 01/21/23 0307 01/22/23 0107 01/23/23 0136 01/24/23 0920 01/25/23 0150  WBC 19.9* 9.5 11.9* 9.6 8.2 7.3 7.9  -Continue to Monitor for S/Sx of Infection and Repeat CBC in the AM   Benign Essential Hypertension -Was previously on midodrine at home, but this is normotensive -Continue to Monitor BP per Protocol -Last BP reading was 104/57   AF (Paroxysmal Atrial Fibrillation) (HCC) -Currently appears to be in sinus rhythm -C/w Metoprolol Tartrate 5 mg IV q6hprn HBP -If necessary will place on telemetry  Constipation -Bowel Regimen adjusted -Added Senna-Docusate 1 tab po BID, Miralax 17 grams BID, and added Bisacodyl 10 mg RC Suppository   CKD Stage 3b -BUN/Cr Trend: Recent Labs  Lab 01/19/23 0754 01/20/23 0130 01/21/23 0307 01/22/23 0107 01/23/23 0136 01/24/23 0920 01/25/23 0150  BUN 57* 46* 49* 47* 41* 33* 32*  CREATININE 1.75* 2.04* 1.89* 1.83* 1.66* 1.45* 1.50*  -Avoid Nephrotoxic Medications, Contrast Dyes, Hypotension and Dehydration to Ensure Adequate Renal Perfusion and will need to Renally Adjust Meds -Continue to Monitor and Trend Renal Function carefully and repeat CMP within 1 week   Acute Blood Loss Anemia as a postoperative cause -Hgb/Hct Trend: Recent Labs  Lab 01/19/23 0754 01/20/23 0130 01/21/23 0307 01/22/23 0107 01/23/23 0136  01/24/23 0920 01/25/23 0150  HGB 13.4 13.6 10.9* 10.2* 9.8* 10.1* 10.1*  HCT 44.2 43.9 35.0* 33.8* 32.1* 33.3* 33.1*  MCV 85.7 85.9 85.0 85.1 85.4 85.6 85.8  -Checked Anemia Panel and showed an iron level of 22, UIBC of 243, TIBC of 265, saturation ratios of 8%, ferritin level 92, folate 6.5 and vitamin B12 level 251 -Continue to monitor for signs and symptoms of bleeding; no overt bleeding noted -Patient having his dressing change tomorrow -Repeat CBC within 1 week   Hypoalbuminemia -Patient's Albumin Trend: Recent Labs  Lab 01/19/23 0754 01/21/23 0307 01/22/23 0107 01/23/23 0136 01/24/23 0920 01/25/23 0150  ALBUMIN 3.9 2.8* 2.8* 2.6* 2.6* 2.5*  -Continue to Monitor and Trend and repeat CMP in the AM    Morbid Obesity with BMI of 45.0-49.9, adult (HCC) -Complicates overall prognosis and care -Estimated body mass index is 45.24 kg/m as calculated from the following:   Height as of this encounter: 6' (1.829 m).   Weight as of this encounter: 151.3 kg.  -Weight Loss and Dietary Counseling given

## 2023-01-22 NOTE — Progress Notes (Addendum)
Orthopaedic Trauma Service Progress Note Weekend Coverage   Patient ID: Christopher [name] MRN: [id number] DOB/AGE: February 09, 1951 72 y.o.  Subjective:  Progressing slowly  Has not been up to chair yet Pain tolerable   Likely needs snf given level of mobility thus far   ROS As above  Objective:   VITALS:   Vitals:   01/21/23 0714 01/21/23 1530 01/21/23 2357 01/22/23 0804  BP: 110/63 105/62 107/63 (!) 119/59  Pulse: 85 92 84 99  Resp: 18 18 20 18   Temp: 98.4 F (36.9 C) 98.2 F (36.8 C) 98.5 F (36.9 C)   TempSrc: Oral  Oral   SpO2: 98% 95% 97% 91%  Weight:      Height:        Estimated body mass index is 45.24 kg/m as calculated from the following:   Height as of this encounter: 6' (1.829 m).   Weight as of this encounter: 151.3 kg.   Intake/Output      05/31 0701 06/01 0700 06/01 0701 06/02 0700   P.O. 720    I.V. (mL/kg) 85.8 (0.6)    IV Piggyback     Total Intake(mL/kg) 805.8 (5.3)    Urine (mL/kg/hr) 1150 (0.3) 200 (0.4)   Blood     Total Output 1150 200   Net -344.2 -200          LABS  Results for orders placed or performed during the hospital encounter of 01/19/23 (from the past 24 hour(s))  CBC     Status: Abnormal   Collection Time: 01/22/23  1:07 AM  Result Value Ref Range   WBC 9.6 4.0 - 10.5 K/uL   RBC 3.97 (L) 4.22 - 5.81 MIL/uL   Hemoglobin 10.2 (L) 13.0 - 17.0 g/dL   HCT 29.5 (L) 62.1 - 30.8 %   MCV 85.1 80.0 - 100.0 fL   MCH 25.7 (L) 26.0 - 34.0 pg   MCHC 30.2 30.0 - 36.0 g/dL   RDW 65.7 84.6 - 96.2 %   Platelets 203 150 - 400 K/uL   nRBC 0.0 0.0 - 0.2 %     PHYSICAL EXAM:   Gen: sitting up in bed, NAD  Lungs: unlabored Ext:       Left Lower extremity   Dressing clean, dry and intact. Requesting it not be changed.    Ext warm   Chronic edema noted  Ext warm   + DP pulse  Distal motor and sensory functions intact    Assessment/Plan: 2 Days Post-Op       Anti-infectives (From admission, onward)    Start     Dose/Rate Route Frequency Ordered Stop   01/20/23 1400  ceFAZolin (ANCEF) IVPB 2g/100 mL premix        2 g 200 mL/hr over 30 Minutes Intravenous Every 8 hours 01/20/23 1028 01/21/23 0620   01/20/23 0841  vancomycin (VANCOCIN) powder  Status:  Discontinued          As needed 01/20/23 0842 01/20/23 0911     .  POD/HD#: 2  72 y/o male s/p IORIF L distal femur fracture    Weightbearing: WBAT LLE ROM: Okay for unrestricted ROM Incisional and dressing care: Reinforce dressings as needed  Showering: Hold off on showering for now.  Incisions may begin getting wet 01/23/2023 if no drainage noted. Orthopedic  device(s): None  Pain management:  1. Tylenol 325- 650 mg q 6 hours prn mild pain or fever  2. Robaxin 500 mg q 6 hours PRN 3. Oxycodone 5-10 mg q 4 hours PRN----> pt was requesting more frequent oxy as 10 mg was not sufficient in his opinion.  He may not be responding to oxycodone therefore will change meds to norco 5/325 1-2 tabs q6h prn moderate to severe pain  4. Morphine 2 mg q 2 hours PRN VTE prophylaxis: Lovenox (weightbased), SCDs Hemodynamics: h/h stable, monitor ID:  Ancef 2gm post op completed  Foley/Lines:  No foley, KVO IVFs Impediments to Fracture Healing: Vitamin D level pending, will start supplementation as indicated. Dispo: PT/OT dispo pending but likely needs snf.    Mearl Latin, PA-C [phone] (C) 01/22/2023, 10:27 AM  [facility] [address] [place] [address] [phone] [phone] (F)    After 5pm and on the weekends please log on to Amion, go to orthopaedics and the look under the [facility] Call for the provider(s) on call. You can also call our office at [phone] and then follow the prompts to be connected to the call team.  Patient ID: Christopher [name], male   DOB: [birth year], 72 y.o.   MRN: [id number]

## 2023-01-22 NOTE — Progress Notes (Signed)
PROGRESS NOTE    Christopher [name]  ZOX:[id number] DOB: 07-26-1951 DOA: 01/19/2023 PCP: Danella Penton, MD   Brief Narrative:  Christopher [name] is a 72 y.o. male with medical history significant for CKD stage III, morbid obesity, hypertension, hyperlipidemia, COPD on oxygen as needed being admitted to the hospital with left distal femur fracture.  Patient is wheelchair-bound at baseline, walks a few steps with rolling walker at home.  Lives with his daughter who helps take care of him.  He has been in his usual state of health until late last night when he slipped getting out of the shower.  Immediate left knee pain and inability to bear any weight.  Was brought to the ED at [facility] where x-rays showed a distal femur fracture.  Orthopedic surgery was consulted, recommended transfer to [facility] for orthopedic trauma evaluation.    He underwent an open reduction internal fixation of a left supracondylar distal femur fracture on 01/20/2023 and is postoperative day 2.  PT OT evaluated and recommending SNF.  Pain is fairly well-controlled.      Assessment and Plan: No notes have been filed under this hospital service. Service: Hospitalist   Closed left subtrochanteric femur fracture, initial encounter (HCC) status post open reduction internal fixation of left supracondylar distal femur fracture POD 2 -Inpatient admission to MedSurg -CT knee done and showed "Acute comminuted impacted fracture of the distal femoral metaphysis as described above. Old healed fracture of the proximal fibula. Tricompartmental osteoarthritis." -Received Tranexamic Acid  -Patient has a leukocytosis of 19.9 which is now improved -Pain control with IV Morphine 2 mg every 2 as needed severe pain has not been adjusted and Acetaminophen 650 mg p.o./RC every 6h has been added and oxycodone has been increased to 5 to 10 mg every 4 as needed  -Nonweightbearing status currently and further weightbearing status per orthopedic surgery -Has  been seen by orthopedic surgery and was taken for ORIF on 01/20/23 -Was n.p.o. at midnight but now on a diet -Orthopedic surgery recommending weightbearing as tolerated left lower extremity and okay for unrestricted range of motion -For discharge they are recommending acetaminophen versus oxycodone for pain control and recommending Eliquis 2.5 mg twice daily for 30 days for DVT prophylaxis -Orthopedic Surgery is planning to remove and change the patient's left lower extremity dressings on 01/22/2023 and recommending Lovenox for VTE prophylaxis for now -PT OT recommending SNF and TOC consulted for assistance with placement as patient is agreeable   Peripheral Edema in the setting of Acute on Chronic Diastolic Dysfunction -last echo in our system from 2022 with preserved EF indeterminate diastolic parameters.   -Chest x-ray with concern for possible pulmonary edema. -Received a dose of IV Lasix on the day of Admission -BNP was 72.1 but obese patient is not very short -Obtain 2D echo and this was done and showed an EF of 55 to 60% with grade 1 diastolic dysfunction -Strict I's and O's and Daily Weights.  Intake/Output Summary (Last 24 hours) at 01/22/2023 1420 Last data filed at 01/22/2023 8119 Gross per 24 hour  Intake 85.77 ml  Output 1200 ml  Net -1114.23 ml   -Continue to monitor for signs and symptoms of volume overload -Repeat CMP in the AM -CXR done yesterday and showed "The mediastinal contours are within normal limits. No cardiomegaly. There is mild cephalization of the pulmonary vasculature and prominence of the vascular structures about the right hilum, slightly decreased from comparison. No focal consolidations, pleural effusion, or pneumothorax. No acute osseous  abnormality."   Sleep apnea with COPD overlap -Continue supplemental oxygen as needed and wean O2 as tolerated to room air -Continuous pulse oximetry maintain O2 saturation greater than 90% SpO2: 91 % O2 Flow Rate (L/min): 2  L/min -No evidence of acute exacerbation -Continue Xopenex every 4 hours as needed shortness of breath -Will order CPAP for the evening   Hyperlipidemia -C/w Atorvastatin 20 mg po qHS   Hypothyroidism -Check TSH in the a.m. -C/w Levothyroxine 88 mcg po Daily   Leukocytosis -In the setting of surgery and likely reactive. WBC Trend: Recent Labs  Lab 01/19/23 0754 01/20/23 0130 01/21/23 0307 01/22/23 0107  WBC 19.9* 9.5 11.9* 9.6  -Continue to Monitor for S/Sx of Infection and Repeat CBC in the AM   Benign Essential Hypertension -Was previously on midodrine at home, but this is normotensive -Continue to Monitor BP per Protocol -Last BP reading was 119/59   AF (Paroxysmal Atrial Fibrillation) (HCC) -Currently appears to be in sinus rhythm -C/w Metoprolol Tartrate 5 mg IV q6hprn HBP -If necessary will place on telemetry   CKD Stage 3b -BUN/Cr Trend: Recent Labs  Lab 01/19/23 0754 01/20/23 0130 01/21/23 0307 01/22/23 0107  BUN 57* 46* 49* 47*  CREATININE 1.75* 2.04* 1.89* 1.83*  -Avoid Nephrotoxic Medications, Contrast Dyes, Hypotension and Dehydration to Ensure Adequate Renal Perfusion and will need to Renally Adjust Meds -Continue to Monitor and Trend Renal Function carefully and repeat CMP in the AM    Acute blood loss anemia as a postoperative cause -Hgb/Hct Trend: Recent Labs  Lab 01/19/23 0754 01/20/23 0130 01/21/23 0307 01/22/23 0107  HGB 13.4 13.6 10.9* 10.2*  HCT 44.2 43.9 35.0* 33.8*  MCV 85.7 85.9 85.0 85.1  -Check anemia panel in the a.m. Continue to monitor for signs and symptoms of bleeding; no overt bleeding noted -Patient having his dressing change tomorrow -Repeat CBC in a.m.   Hypoalbuminemia -Patient's Albumin Trend: Recent Labs  Lab 01/19/23 0754 01/21/23 0307 01/22/23 0107  ALBUMIN 3.9 2.8* 2.8*  -Continue to Monitor and Trend and repeat CMP in the AM    Morbid Obesity with BMI of 45.0-49.9, adult (HCC) -Complicates overall  prognosis and care -Estimated body mass index is 45.24 kg/m as calculated from the following:   Height as of this encounter: 6' (1.829 m).   Weight as of this encounter: 151.3 kg.  -Weight Loss and Dietary Counseling given   DVT prophylaxis: SCDs Start: 01/20/23 1029 SCDs Start: 01/19/23 1617    Code Status: Full Code Family Communication: No family currently at bedside  Disposition Plan:  Level of care: Med-Surg Status is: Inpatient Remains inpatient appropriate because: Needs SNF   Consultants:  Orthopedic Surgery  Procedures:  Procedures: CPT 27511-Open reduction internal fixation of left supracondylar distal femur fracture  ECHOCARDIOGRAM IMPRESSIONS     1. Left ventricular ejection fraction, by estimation, is 55 to 60%. The  left ventricle has normal function. The left ventricle has no regional  wall motion abnormalities. Left ventricular diastolic parameters are  consistent with Grade I diastolic  dysfunction (impaired relaxation).   2. Right ventricular systolic function is normal. The right ventricular  size is mildly enlarged.   3. The mitral valve is grossly normal. Trivial mitral valve  regurgitation. No evidence of mitral stenosis.   4. The aortic valve is tricuspid. Aortic valve regurgitation is not  visualized. Aortic valve sclerosis/calcification is present, without any  evidence of aortic stenosis.   5. Aortic dilatation noted. There is mild dilatation of the ascending  aorta, measuring 40 mm.   6. The inferior vena cava is normal in size with greater than 50%  respiratory variability, suggesting right atrial pressure of 3 mmHg.   Comparison(s): No significant change from prior study.   FINDINGS   Left Ventricle: Left ventricular ejection fraction, by estimation, is 55  to 60%. The left ventricle has normal function. The left ventricle has no  regional wall motion abnormalities. The left ventricular internal cavity  size was normal in size. There is    no left ventricular hypertrophy. Left ventricular diastolic parameters  are consistent with Grade I diastolic dysfunction (impaired relaxation).   Right Ventricle: The right ventricular size is mildly enlarged. Right  vetricular wall thickness was not well visualized. Right ventricular  systolic function is normal.   Left Atrium: Left atrial size was normal in size.   Right Atrium: Right atrial size was normal in size.   Pericardium: There is no evidence of pericardial effusion.   Mitral Valve: The mitral valve is grossly normal. Trivial mitral valve  regurgitation. No evidence of mitral valve stenosis.   Tricuspid Valve: The tricuspid valve is normal in structure. Tricuspid  valve regurgitation is not demonstrated.   Aortic Valve: The aortic valve is tricuspid. Aortic valve regurgitation is  not visualized. Aortic valve sclerosis/calcification is present, without  any evidence of aortic stenosis. Aortic valve peak gradient measures 8.8  mmHg.   Pulmonic Valve: The pulmonic valve was not well visualized. Pulmonic valve  regurgitation is trivial.   Aorta: Aortic dilatation noted. There is mild dilatation of the ascending  aorta, measuring 40 mm.   Venous: The inferior vena cava is normal in size with greater than 50%  respiratory variability, suggesting right atrial pressure of 3 mmHg.   IAS/Shunts: The atrial septum is grossly normal.     LEFT VENTRICLE  PLAX 2D  LVIDd:         6.00 cm   Diastology  LVIDs:         4.20 cm   LV e' medial:    8.08 cm/s  LV PW:         1.10 cm   LV E/e' medial:  7.8  LV IVS:        0.90 cm   LV e' lateral:   8.86 cm/s  LVOT diam:     2.50 cm   LV E/e' lateral: 7.1  LV SV:         79  LV SV Index:   30  LVOT Area:     4.91 cm     RIGHT VENTRICLE             IVC  RV S prime:     19.10 cm/s  IVC diam: 1.90 cm  TAPSE (M-mode): 2.8 cm   LEFT ATRIUM           Index        RIGHT ATRIUM           Index  LA diam:      3.60 cm 1.36 cm/m    RA Area:     19.40 cm  LA Vol (A2C): 52.0 ml 19.64 ml/m  RA Volume:   54.60 ml  20.62 ml/m  LA Vol (A4C): 47.9 ml 18.09 ml/m   AORTIC VALVE  AV Area (Vmax): 3.44 cm  AV Vmax:        148.00 cm/s  AV Peak Grad:   8.8 mmHg  LVOT Vmax:  103.57 cm/s  LVOT Vmean:     64.167 cm/s  LVOT VTI:       0.161 m    AORTA  Ao Root diam: 3.90 cm  Ao Asc diam:  4.00 cm   MITRAL VALVE  MV Area (PHT): 4.21 cm    SHUNTS  MV Decel Time: 180 msec    Systemic VTI:  0.16 m  MR Peak grad: 6.5 mmHg     Systemic Diam: 2.50 cm  MR Vmax:      127.00 cm/s  MV E velocity: 62.90 cm/s  MV A velocity: 88.60 cm/s  MV E/A ratio:  0.71   Antimicrobials:  Anti-infectives (From admission, onward)    Start     Dose/Rate Route Frequency Ordered Stop   01/20/23 1400  ceFAZolin (ANCEF) IVPB 2g/100 mL premix        2 g 200 mL/hr over 30 Minutes Intravenous Every 8 hours 01/20/23 1028 01/21/23 0620   01/20/23 0841  vancomycin (VANCOCIN) powder  Status:  Discontinued          As needed 01/20/23 0842 01/20/23 0911       Subjective: Seen and examined at bedside and he is resting in doing okay.  Denied complaints of pain.  Still has not had a bowel movement yet but passing flatus.  No nausea or vomiting.  No other concerns or complaints this time.  Objective: Vitals:   01/21/23 0714 01/21/23 1530 01/21/23 2357 01/22/23 0804  BP: 110/63 105/62 107/63 (!) 119/59  Pulse: 85 92 84 99  Resp: 18 18 20 18   Temp: 98.4 F (36.9 C) 98.2 F (36.8 C) 98.5 F (36.9 C)   TempSrc: Oral  Oral   SpO2: 98% 95% 97% 91%  Weight:      Height:        Intake/Output Summary (Last 24 hours) at 01/22/2023 1451 Last data filed at 01/22/2023 1610 Gross per 24 hour  Intake 85.77 ml  Output 1200 ml  Net -1114.23 ml   Filed Weights   01/19/23 1534  Weight: (!) 151.3 kg   Examination: Physical Exam:  Constitutional: WN/WD morbidly obese Caucasian male in no acute distress appears calm Respiratory: Diminished to  auscultation bilaterally, no wheezing, rales, rhonchi or crackles. Normal respiratory effort and patient is not tachypenic. No accessory muscle use.  Unlabored breathing Cardiovascular: RRR, no murmurs / rubs / gallops. S1 and S2 auscultated.  Left leg wrapped and has some slight right lower extremity edema Abdomen: Soft, non-tender, distended secondary to body habitus. Bowel sounds positive.  GU: Deferred. Musculoskeletal: No clubbing / cyanosis of digits/nails.  Leg is wrapped Skin: No rashes, lesions, ulcers on limited skin evaluation. No induration; Warm and dry.  Neurologic: CN 2-12 grossly intact with no focal deficits. Romberg sign and cerebellar reflexes not assessed.  Psychiatric: Normal judgment and insight. Alert and oriented x 3. Normal mood and appropriate affect.   Data Reviewed: I have personally reviewed following labs and imaging studies  CBC: Recent Labs  Lab 01/19/23 0754 01/20/23 0130 01/21/23 0307 01/22/23 0107  WBC 19.9* 9.5 11.9* 9.6  NEUTROABS 17.2*  --  9.2*  --   HGB 13.4 13.6 10.9* 10.2*  HCT 44.2 43.9 35.0* 33.8*  MCV 85.7 85.9 85.0 85.1  PLT 258 229 209 203   Basic Metabolic Panel: Recent Labs  Lab 01/19/23 0754 01/20/23 0130 01/21/23 0307 01/22/23 0107  NA 140 139 138 138  K 5.0 4.2 4.3 4.3  CL 103 100 102 102  CO2 28 28 28 28   GLUCOSE 110* 104* 128* 106*  BUN 57* 46* 49* 47*  CREATININE 1.75* 2.04* 1.89* 1.83*  CALCIUM 9.3 8.8* 8.2* 8.2*  MG  --   --  2.2 2.1  PHOS  --   --  3.5 3.6   GFR: Estimated Creatinine Clearance: 55.3 mL/min (A) (by C-G formula based on SCr of 1.83 mg/dL (H)). Liver Function Tests: Recent Labs  Lab 01/19/23 0754 01/21/23 0307 01/22/23 0107  AST 19 12* 13*  ALT 11 8 5   ALKPHOS 55 44 38  BILITOT 0.6 0.6 0.5  PROT 6.8 5.4* 5.4*  ALBUMIN 3.9 2.8* 2.8*   No results for input(s): "LIPASE", "AMYLASE" in the last 168 hours. No results for input(s): "AMMONIA" in the last 168 hours. Coagulation Profile: No  results for input(s): "INR", "PROTIME" in the last 168 hours. Cardiac Enzymes: No results for input(s): "CKTOTAL", "CKMB", "CKMBINDEX", "TROPONINI" in the last 168 hours. BNP (last 3 results) No results for input(s): "PROBNP" in the last 8760 hours. HbA1C: No results for input(s): "HGBA1C" in the last 72 hours. CBG: No results for input(s): "GLUCAP" in the last 168 hours. Lipid Profile: No results for input(s): "CHOL", "HDL", "LDLCALC", "TRIG", "CHOLHDL", "LDLDIRECT" in the last 72 hours. Thyroid Function Tests: No results for input(s): "TSH", "T4TOTAL", "FREET4", "T3FREE", "THYROIDAB" in the last 72 hours. Anemia Panel: No results for input(s): "VITAMINB12", "FOLATE", "FERRITIN", "TIBC", "IRON", "RETICCTPCT" in the last 72 hours. Sepsis Labs: No results for input(s): "PROCALCITON", "LATICACIDVEN" in the last 168 hours.  Recent Results (from the past 240 hour(s))  Surgical PCR screen     Status: None   Collection Time: 01/19/23  4:22 PM   Specimen: Nasal Mucosa; Nasal Swab  Result Value Ref Range Status   MRSA, PCR NEGATIVE NEGATIVE Final   Staphylococcus aureus NEGATIVE NEGATIVE Final    Comment: (NOTE) The Xpert SA Assay (FDA approved for NASAL specimens in patients 45 years of age and older), is one component of a comprehensive surveillance program. It is not intended to diagnose infection nor to guide or monitor treatment. Performed at [facility], [address] [place], [place] [address]     Radiology Studies: DG CHEST PORT 1 VIEW  Result Date: 01/21/2023 CLINICAL DATA:  72 year old male with history of shortness of breath. EXAM: PORTABLE CHEST - 1 VIEW COMPARISON:  01/19/2023, 10/17/2021 FINDINGS: The mediastinal contours are within normal limits. No cardiomegaly. There is mild cephalization of the pulmonary vasculature and prominence of the vascular structures about the right hilum, slightly decreased from comparison. No focal consolidations, pleural effusion, or  pneumothorax. No acute osseous abnormality. IMPRESSION: Mild pulmonary vascular prominence without evidence of pulmonary edema. Electronically Signed   By: Marliss Coots M.D.   On: 01/21/2023 07:40   ECHOCARDIOGRAM COMPLETE  Result Date: 01/20/2023    ECHOCARDIOGRAM REPORT   Patient Name:   Ashlee Farrugia Date of Exam: 01/20/2023 Medical Rec #:  [id number]      Height:       72.0 in Accession #:    [id number]     Weight:       333.6 lb Date of Birth:  02/22/1951      BSA:          2.648 m Patient Age:    71 years       BP:           124/75 mmHg Patient Gender: M  HR:           93 bpm. Exam Location:  Inpatient Procedure: 2D Echo Indications:    Lower extremity edema                 Chronic Diastolic Heart Failure  History:        Patient has prior history of Echocardiogram examinations. CHF;                 Signs/Symptoms:Edema.  Sonographer:    Lucendia Herrlich Referring Phys: [id number] SARAH A MCCLUNG IMPRESSIONS  1. Left ventricular ejection fraction, by estimation, is 55 to 60%. The left ventricle has normal function. The left ventricle has no regional wall motion abnormalities. Left ventricular diastolic parameters are consistent with Grade I diastolic dysfunction (impaired relaxation).  2. Right ventricular systolic function is normal. The right ventricular size is mildly enlarged.  3. The mitral valve is grossly normal. Trivial mitral valve regurgitation. No evidence of mitral stenosis.  4. The aortic valve is tricuspid. Aortic valve regurgitation is not visualized. Aortic valve sclerosis/calcification is present, without any evidence of aortic stenosis.  5. Aortic dilatation noted. There is mild dilatation of the ascending aorta, measuring 40 mm.  6. The inferior vena cava is normal in size with greater than 50% respiratory variability, suggesting right atrial pressure of 3 mmHg. Comparison(s): No significant change from prior study. FINDINGS  Left Ventricle: Left ventricular ejection fraction,  by estimation, is 55 to 60%. The left ventricle has normal function. The left ventricle has no regional wall motion abnormalities. The left ventricular internal cavity size was normal in size. There is  no left ventricular hypertrophy. Left ventricular diastolic parameters are consistent with Grade I diastolic dysfunction (impaired relaxation). Right Ventricle: The right ventricular size is mildly enlarged. Right vetricular wall thickness was not well visualized. Right ventricular systolic function is normal. Left Atrium: Left atrial size was normal in size. Right Atrium: Right atrial size was normal in size. Pericardium: There is no evidence of pericardial effusion. Mitral Valve: The mitral valve is grossly normal. Trivial mitral valve regurgitation. No evidence of mitral valve stenosis. Tricuspid Valve: The tricuspid valve is normal in structure. Tricuspid valve regurgitation is not demonstrated. Aortic Valve: The aortic valve is tricuspid. Aortic valve regurgitation is not visualized. Aortic valve sclerosis/calcification is present, without any evidence of aortic stenosis. Aortic valve peak gradient measures 8.8 mmHg. Pulmonic Valve: The pulmonic valve was not well visualized. Pulmonic valve regurgitation is trivial. Aorta: Aortic dilatation noted. There is mild dilatation of the ascending aorta, measuring 40 mm. Venous: The inferior vena cava is normal in size with greater than 50% respiratory variability, suggesting right atrial pressure of 3 mmHg. IAS/Shunts: The atrial septum is grossly normal.  LEFT VENTRICLE PLAX 2D LVIDd:         6.00 cm   Diastology LVIDs:         4.20 cm   LV e' medial:    8.08 cm/s LV PW:         1.10 cm   LV E/e' medial:  7.8 LV IVS:        0.90 cm   LV e' lateral:   8.86 cm/s LVOT diam:     2.50 cm   LV E/e' lateral: 7.1 LV SV:         79 LV SV Index:   30 LVOT Area:     4.91 cm  RIGHT VENTRICLE  IVC RV S prime:     19.10 cm/s  IVC diam: 1.90 cm TAPSE (M-mode): 2.8 cm LEFT  ATRIUM           Index        RIGHT ATRIUM           Index LA diam:      3.60 cm 1.36 cm/m   RA Area:     19.40 cm LA Vol (A2C): 52.0 ml 19.64 ml/m  RA Volume:   54.60 ml  20.62 ml/m LA Vol (A4C): 47.9 ml 18.09 ml/m  AORTIC VALVE AV Area (Vmax): 3.44 cm AV Vmax:        148.00 cm/s AV Peak Grad:   8.8 mmHg LVOT Vmax:      103.57 cm/s LVOT Vmean:     64.167 cm/s LVOT VTI:       0.161 m  AORTA Ao Root diam: 3.90 cm Ao Asc diam:  4.00 cm MITRAL VALVE MV Area (PHT): 4.21 cm    SHUNTS MV Decel Time: 180 msec    Systemic VTI:  0.16 m MR Peak grad: 6.5 mmHg     Systemic Diam: 2.50 cm MR Vmax:      127.00 cm/s MV E velocity: 62.90 cm/s MV A velocity: 88.60 cm/s MV E/A ratio:  0.71 Laurance Flatten MD Electronically signed by Laurance Flatten MD Signature Date/Time: 01/20/2023/4:23:16 PM    Final     Scheduled Meds:  acetaminophen  650 mg Oral Q6H   aspirin EC  81 mg Oral QHS   atorvastatin  20 mg Oral QHS   docusate sodium  100 mg Oral BID   enoxaparin (LOVENOX) injection  70 mg Subcutaneous Q24H   levothyroxine  88 mcg Oral Q0600   loratadine  10 mg Oral Daily   mupirocin ointment  1 Application Nasal BID   sertraline  50 mg Oral Daily   Continuous Infusions:  sodium chloride Stopped (01/21/23 0858)   methocarbamol (ROBAXIN) IV      LOS: 3 days   Marguerita Merles, DO [facility] Available via Epic secure chat 7am-7pm After these hours, please refer to coverage provider listed on [email or web address] 01/22/2023, 2:51 PM

## 2023-01-23 DIAGNOSIS — I10 Essential (primary) hypertension: Secondary | ICD-10-CM | POA: Diagnosis not present

## 2023-01-23 DIAGNOSIS — S7222XA Displaced subtrochanteric fracture of left femur, initial encounter for closed fracture: Secondary | ICD-10-CM | POA: Diagnosis not present

## 2023-01-23 DIAGNOSIS — I480 Paroxysmal atrial fibrillation: Secondary | ICD-10-CM | POA: Diagnosis not present

## 2023-01-23 LAB — COMPREHENSIVE METABOLIC PANEL
ALT: 6 U/L (ref 0–44)
AST: 9 U/L — ABNORMAL LOW (ref 15–41)
Albumin: 2.6 g/dL — ABNORMAL LOW (ref 3.5–5.0)
Alkaline Phosphatase: 38 U/L (ref 38–126)
Anion gap: 6 (ref 5–15)
BUN: 41 mg/dL — ABNORMAL HIGH (ref 8–23)
CO2: 29 mmol/L (ref 22–32)
Calcium: 8.2 mg/dL — ABNORMAL LOW (ref 8.9–10.3)
Chloride: 102 mmol/L (ref 98–111)
Creatinine, Ser: 1.66 mg/dL — ABNORMAL HIGH (ref 0.61–1.24)
GFR, Estimated: 44 mL/min — ABNORMAL LOW (ref >60)
Glucose, Bld: 106 mg/dL — ABNORMAL HIGH (ref 70–99)
Potassium: 4.3 mmol/L (ref 3.5–5.1)
Sodium: 137 mmol/L (ref 135–145)
Total Bilirubin: 0.8 mg/dL (ref 0.3–1.2)
Total Protein: 5.4 g/dL — ABNORMAL LOW (ref 6.5–8.1)

## 2023-01-23 LAB — CBC WITH DIFFERENTIAL/PLATELET
Abs Immature Granulocytes: 0.35 10*3/uL — ABNORMAL HIGH (ref 0.00–0.07)
Basophils Absolute: 0.1 10*3/uL (ref 0.0–0.1)
Basophils Relative: 1 %
Eosinophils Absolute: 0.9 10*3/uL — ABNORMAL HIGH (ref 0.0–0.5)
Eosinophils Relative: 11 %
HCT: 32.1 % — ABNORMAL LOW (ref 39.0–52.0)
Hemoglobin: 9.8 g/dL — ABNORMAL LOW (ref 13.0–17.0)
Immature Granulocytes: 4 %
Lymphocytes Relative: 7 %
Lymphs Abs: 0.6 10*3/uL — ABNORMAL LOW (ref 0.7–4.0)
MCH: 26.1 pg (ref 26.0–34.0)
MCHC: 30.5 g/dL (ref 30.0–36.0)
MCV: 85.4 fL (ref 80.0–100.0)
Monocytes Absolute: 1.1 10*3/uL — ABNORMAL HIGH (ref 0.1–1.0)
Monocytes Relative: 13 %
Neutro Abs: 5.2 10*3/uL (ref 1.7–7.7)
Neutrophils Relative %: 64 %
Platelets: 210 10*3/uL (ref 150–400)
RBC: 3.76 MIL/uL — ABNORMAL LOW (ref 4.22–5.81)
RDW: 15.3 % (ref 11.5–15.5)
WBC: 8.2 10*3/uL (ref 4.0–10.5)
nRBC: 0 % (ref 0.0–0.2)

## 2023-01-23 LAB — PHOSPHORUS: Phosphorus: 3.4 mg/dL (ref 2.5–4.6)

## 2023-01-23 LAB — FOLATE: Folate: 6.5 ng/mL (ref >5.9)

## 2023-01-23 LAB — RETICULOCYTES
Immature Retic Fract: 32.6 % — ABNORMAL HIGH (ref 2.3–15.9)
RBC.: 3.71 MIL/uL — ABNORMAL LOW (ref 4.22–5.81)
Retic Count, Absolute: 68.3 10*3/uL (ref 19.0–186.0)
Retic Ct Pct: 1.8 % (ref 0.4–3.1)

## 2023-01-23 LAB — IRON AND TIBC
Iron: 22 ug/dL — ABNORMAL LOW (ref 45–182)
Saturation Ratios: 8 % — ABNORMAL LOW (ref 17.9–39.5)
TIBC: 265 ug/dL (ref 250–450)
UIBC: 243 ug/dL

## 2023-01-23 LAB — FERRITIN: Ferritin: 92 ng/mL (ref 24–336)

## 2023-01-23 LAB — TSH: TSH: 3.519 u[IU]/mL (ref 0.350–4.500)

## 2023-01-23 LAB — MAGNESIUM: Magnesium: 2 mg/dL (ref 1.7–2.4)

## 2023-01-23 LAB — VITAMIN B12: Vitamin B-12: 251 pg/mL (ref 180–914)

## 2023-01-23 LAB — VITAMIN D 25 HYDROXY (VIT D DEFICIENCY, FRACTURES): Vit D, 25-Hydroxy: 27.83 ng/mL — ABNORMAL LOW (ref 30–100)

## 2023-01-23 NOTE — Progress Notes (Signed)
PROGRESS NOTE    Christopher [name]  WUJ:[id number] DOB: 1951-03-06 DOA: 01/19/2023 PCP: Danella Penton, MD   Brief Narrative:  Christopher [name] is a 72 y.o. male with medical history significant for CKD stage III, morbid obesity, hypertension, hyperlipidemia, COPD on oxygen as needed being admitted to the hospital with left distal femur fracture.  Patient is wheelchair-bound at baseline, walks a few steps with rolling walker at home.  Lives with his daughter who helps take care of him.  Christopher [name] has been in his usual state of health until late last night when Christopher [name] slipped getting out of the shower.  Immediate left knee pain and inability to bear any weight.  Was brought to the ED at [facility] where x-rays showed a distal femur fracture.  Orthopedic surgery was consulted, recommended transfer to [facility] for orthopedic trauma evaluation.    Christopher [name] underwent an open reduction internal fixation of a left supracondylar distal femur fracture on 01/20/2023 and is postoperative day 3.  PT OT evaluated and recommending SNF.  Pain is fairly well-controlled.      Assessment and Plan: No notes have been filed under this hospital service. Service: Hospitalist   Closed left subtrochanteric femur fracture, initial encounter (HCC) status post open reduction internal fixation of left supracondylar distal femur fracture POD 3 -Inpatient admission to MedSurg -CT knee done and showed "Acute comminuted impacted fracture of the distal femoral metaphysis as described above. Old healed fracture of the proximal fibula. Tricompartmental osteoarthritis." -Received Tranexamic Acid  -Patient has a leukocytosis of 19.9 which is now improved -Pain control with Acetaminophen 325-650 mg po q6hprn Mild Pain, Hydrocodone-Acetaminophen 1-2 tabs orally every 6 hours as needed for moderate-severe pain and have changed his IV morphine to IV fentanyl 12.5-25 mcg every 2 hours as needed for breakthrough pain -Nonweightbearing status currently and further  weightbearing status per orthopedic surgery -Has been seen by orthopedic surgery and was taken for ORIF on 01/20/23 -Was n.p.o. at midnight but now on a diet -Orthopedic surgery recommending weightbearing as tolerated left lower extremity and okay for unrestricted range of motion -For discharge they are recommending acetaminophen versus oxycodone for pain control and recommending Eliquis 2.5 mg twice daily for 30 days for DVT prophylaxis -Orthopedic Surgery removed and changed the patient's left lower extremity dressings on 01/22/2023 and recommending Lovenox for VTE prophylaxis for now -PT OT recommending SNF and TOC consulted for assistance with placement as patient is agreeable given his lack of mobility    Peripheral Edema in the setting of Acute on Chronic Diastolic Dysfunction, improved -last echo in our system from 2022 with preserved EF indeterminate diastolic parameters.   -Chest x-ray with concern for possible pulmonary edema. -Received a dose of IV Lasix on the day of Admission -BNP was 72.1 but obese patient is not very short -Obtain 2D echo and this was done and showed an EF of 55 to 60% with grade 1 diastolic dysfunction -Strict I's and O's and Daily Weights.  Intake/Output Summary (Last 24 hours) at 01/23/2023 1338 Last data filed at 01/23/2023 1025 Gross per 24 hour  Intake 120 ml  Output 780 ml  Net -660 ml   -Continue to monitor for signs and symptoms of volume overload -Repeat CMP in the AM -CXR done yesterday and showed "The mediastinal contours are within normal limits. No cardiomegaly. There is mild cephalization of the pulmonary vasculature and prominence of the vascular structures about the right hilum, slightly decreased from comparison. No focal consolidations, pleural effusion, or pneumothorax.  No acute osseous abnormality."   Sleep Apnea with COPD overlap -Continue supplemental oxygen as needed and wean O2 as tolerated to room air -Continuous pulse oximetry maintain O2  saturation greater than 90% SpO2: 99 % O2 Flow Rate (L/min): 2 L/min -No evidence of acute exacerbation -Continue Xopenex every 4 hours as needed shortness of breath -Will order CPAP for the evening   Hyperlipidemia -C/w Atorvastatin 20 mg po qHS   Hypothyroidism -Checked TSH was 3.519 -C/w Levothyroxine 88 mcg po Daily   Leukocytosis -In the setting of surgery and likely reactive. WBC Trend: Recent Labs  Lab 01/19/23 0754 01/20/23 0130 01/21/23 0307 01/22/23 0107 01/23/23 0136  WBC 19.9* 9.5 11.9* 9.6 8.2  -Continue to Monitor for S/Sx of Infection and Repeat CBC in the AM   Benign Essential Hypertension -Was previously on midodrine at home, but this is normotensive -Continue to Monitor BP per Protocol -Last BP reading was 120/68   AF (Paroxysmal Atrial Fibrillation) (HCC) -Currently appears to be in sinus rhythm -C/w Metoprolol Tartrate 5 mg IV q6hprn HBP -If necessary will place on telemetry   CKD Stage 3b -BUN/Cr Trend: Recent Labs  Lab 01/19/23 0754 01/20/23 0130 01/21/23 0307 01/22/23 0107 01/23/23 0136  BUN 57* 46* 49* 47* 41*  CREATININE 1.75* 2.04* 1.89* 1.83* 1.66*  -Avoid Nephrotoxic Medications, Contrast Dyes, Hypotension and Dehydration to Ensure Adequate Renal Perfusion and will need to Renally Adjust Meds -Continue to Monitor and Trend Renal Function carefully and repeat CMP in the AM    Acute blood loss anemia as a postoperative cause -Hgb/Hct Trend: Recent Labs  Lab 01/19/23 0754 01/20/23 0130 01/21/23 0307 01/22/23 0107 01/23/23 0136  HGB 13.4 13.6 10.9* 10.2* 9.8*  HCT 44.2 43.9 35.0* 33.8* 32.1*  MCV 85.7 85.9 85.0 85.1 85.4  -Checked Anemia Panel and showed an iron level of 22, UIBC of 243, TIBC of 265, saturation ratios of 8%, ferritin level 92, folate 6.5 and vitamin B12 level 251 -Continue to monitor for signs and symptoms of bleeding; no overt bleeding noted -Patient having his dressing change tomorrow -Repeat CBC in a.m.    Hypoalbuminemia -Patient's Albumin Trend: Recent Labs  Lab 01/19/23 0754 01/21/23 0307 01/22/23 0107 01/23/23 0136  ALBUMIN 3.9 2.8* 2.8* 2.6*  -Continue to Monitor and Trend and repeat CMP in the AM    Morbid Obesity with BMI of 45.0-49.9, adult (HCC) -Complicates overall prognosis and care -Estimated body mass index is 45.24 kg/m as calculated from the following:   Height as of this encounter: 6' (1.829 m).   Weight as of this encounter: 151.3 kg.  -Weight Loss and Dietary Counseling given   DVT prophylaxis: SCDs Start: 01/20/23 1029 SCDs Start: 01/19/23 1617    Code Status: Full Code Family Communication: No family present at bedside  Disposition Plan:  Level of care: Med-Surg Status is: Inpatient Remains inpatient appropriate because: Needs further clinical improvement   Consultants:  Orthopedic Surgery  Procedures:  Procedures: CPT 27511-Open reduction internal fixation of left supracondylar distal femur fracture   ECHOCARDIOGRAM IMPRESSIONS     1. Left ventricular ejection fraction, by estimation, is 55 to 60%. The  left ventricle has normal function. The left ventricle has no regional  wall motion abnormalities. Left ventricular diastolic parameters are  consistent with Grade I diastolic  dysfunction (impaired relaxation).   2. Right ventricular systolic function is normal. The right ventricular  size is mildly enlarged.   3. The mitral valve is grossly normal. Trivial mitral valve  regurgitation. No  evidence of mitral stenosis.   4. The aortic valve is tricuspid. Aortic valve regurgitation is not  visualized. Aortic valve sclerosis/calcification is present, without any  evidence of aortic stenosis.   5. Aortic dilatation noted. There is mild dilatation of the ascending  aorta, measuring 40 mm.   6. The inferior vena cava is normal in size with greater than 50%  respiratory variability, suggesting right atrial pressure of 3 mmHg.   Comparison(s): No  significant change from prior study.   FINDINGS   Left Ventricle: Left ventricular ejection fraction, by estimation, is 55  to 60%. The left ventricle has normal function. The left ventricle has no  regional wall motion abnormalities. The left ventricular internal cavity  size was normal in size. There is   no left ventricular hypertrophy. Left ventricular diastolic parameters  are consistent with Grade I diastolic dysfunction (impaired relaxation).   Right Ventricle: The right ventricular size is mildly enlarged. Right  vetricular wall thickness was not well visualized. Right ventricular  systolic function is normal.   Left Atrium: Left atrial size was normal in size.   Right Atrium: Right atrial size was normal in size.   Pericardium: There is no evidence of pericardial effusion.   Mitral Valve: The mitral valve is grossly normal. Trivial mitral valve  regurgitation. No evidence of mitral valve stenosis.   Tricuspid Valve: The tricuspid valve is normal in structure. Tricuspid  valve regurgitation is not demonstrated.   Aortic Valve: The aortic valve is tricuspid. Aortic valve regurgitation is  not visualized. Aortic valve sclerosis/calcification is present, without  any evidence of aortic stenosis. Aortic valve peak gradient measures 8.8  mmHg.   Pulmonic Valve: The pulmonic valve was not well visualized. Pulmonic valve  regurgitation is trivial.   Aorta: Aortic dilatation noted. There is mild dilatation of the ascending  aorta, measuring 40 mm.   Venous: The inferior vena cava is normal in size with greater than 50%  respiratory variability, suggesting right atrial pressure of 3 mmHg.   IAS/Shunts: The atrial septum is grossly normal.     LEFT VENTRICLE  PLAX 2D  LVIDd:         6.00 cm   Diastology  LVIDs:         4.20 cm   LV e' medial:    8.08 cm/s  LV PW:         1.10 cm   LV E/e' medial:  7.8  LV IVS:        0.90 cm   LV e' lateral:   8.86 cm/s  LVOT diam:      2.50 cm   LV E/e' lateral: 7.1  LV SV:         79  LV SV Index:   30  LVOT Area:     4.91 cm     RIGHT VENTRICLE             IVC  RV S prime:     19.10 cm/s  IVC diam: 1.90 cm  TAPSE (M-mode): 2.8 cm   LEFT ATRIUM           Index        RIGHT ATRIUM           Index  LA diam:      3.60 cm 1.36 cm/m   RA Area:     19.40 cm  LA Vol (A2C): 52.0 ml 19.64 ml/m  RA Volume:   54.60 ml  20.62 ml/m  LA Vol (A4C): 47.9 ml 18.09 ml/m   AORTIC VALVE  AV Area (Vmax): 3.44 cm  AV Vmax:        148.00 cm/s  AV Peak Grad:   8.8 mmHg  LVOT Vmax:      103.57 cm/s  LVOT Vmean:     64.167 cm/s  LVOT VTI:       0.161 m    AORTA  Ao Root diam: 3.90 cm  Ao Asc diam:  4.00 cm   MITRAL VALVE  MV Area (PHT): 4.21 cm    SHUNTS  MV Decel Time: 180 msec    Systemic VTI:  0.16 m  MR Peak grad: 6.5 mmHg     Systemic Diam: 2.50 cm  MR Vmax:      127.00 cm/s  MV E velocity: 62.90 cm/s  MV A velocity: 88.60 cm/s  MV E/A ratio:  0.71   Antimicrobials:  Anti-infectives (From admission, onward)    Start     Dose/Rate Route Frequency Ordered Stop   01/20/23 1400  ceFAZolin (ANCEF) IVPB 2g/100 mL premix        2 g 200 mL/hr over 30 Minutes Intravenous Every 8 hours 01/20/23 1028 01/21/23 0620   01/20/23 0841  vancomycin (VANCOCIN) powder  Status:  Discontinued          As needed 01/20/23 0842 01/20/23 0911       Subjective: Seen and examined at bedside and Christopher [name] is resting and still complaining some pain.  States Christopher [name] still has not had a bowel movement yet.  No nausea or vomiting.  Feels fatigued.  No other concerns or complaints at this time.  Objective: Vitals:   01/22/23 1944 01/23/23 0524 01/23/23 0806 01/23/23 1424  BP: 102/63 115/70 120/68 105/66  Pulse: 87 84 84   Resp: 16 19 16 17   Temp: 98.6 F (37 C)   98.3 F (36.8 C)  TempSrc: Oral     SpO2: 97% 96% 99% 94%  Weight:      Height:        Intake/Output Summary (Last 24 hours) at 01/23/2023 1511 Last data filed at 01/23/2023  1025 Gross per 24 hour  Intake 120 ml  Output 780 ml  Net -660 ml   Filed Weights   01/19/23 1534  Weight: (!) 151.3 kg   Examination: Physical Exam:  Constitutional: WN/WD morbidly obese Caucasian male in no acute distress appears calm Respiratory: Diminished to auscultation bilaterally with some coarse breath sounds, no wheezing, rales, rhonchi or crackles. Normal respiratory effort and patient is not tachypenic. No accessory muscle use.  Unlabored breathing Cardiovascular: RRR, no murmurs / rubs / gallops. S1 and S2 auscultated.  Left leg is wrapped and Christopher [name] does have some right lower extremity edema Abdomen: Soft, non-tender, distended secondary to body habitus. Bowel sounds positive.  GU: Deferred. Musculoskeletal: Left leg is wrapped Skin: No rashes, lesions, ulcers on limited skin evaluation. No induration; Warm and dry.  Neurologic: CN 2-12 grossly intact with no focal deficits. Romberg sign and cerebellar reflexes not assessed.  Psychiatric: Normal judgment and insight. Alert and oriented x 3. Normal mood and appropriate affect.   Data Reviewed: I have personally reviewed following labs and imaging studies  CBC: Recent Labs  Lab 01/19/23 0754 01/20/23 0130 01/21/23 0307 01/22/23 0107 01/23/23 0136  WBC 19.9* 9.5 11.9* 9.6 8.2  NEUTROABS 17.2*  --  9.2*  --  5.2  HGB 13.4 13.6 10.9* 10.2* 9.8*  HCT 44.2 43.9 35.0* 33.8* 32.1*  MCV 85.7 85.9 85.0 85.1 85.4  PLT 258 229 209 203 210   Basic Metabolic Panel: Recent Labs  Lab 01/19/23 0754 01/20/23 0130 01/21/23 0307 01/22/23 0107 01/23/23 0136  NA 140 139 138 138 137  K 5.0 4.2 4.3 4.3 4.3  CL 103 100 102 102 102  CO2 28 28 28 28 29   GLUCOSE 110* 104* 128* 106* 106*  BUN 57* 46* 49* 47* 41*  CREATININE 1.75* 2.04* 1.89* 1.83* 1.66*  CALCIUM 9.3 8.8* 8.2* 8.2* 8.2*  MG  --   --  2.2 2.1 2.0  PHOS  --   --  3.5 3.6 3.4   GFR: Estimated Creatinine Clearance: 60.9 mL/min (A) (by C-G formula based on SCr of  1.66 mg/dL (H)). Liver Function Tests: Recent Labs  Lab 01/19/23 0754 01/21/23 0307 01/22/23 0107 01/23/23 0136  AST 19 12* 13* 9*  ALT 11 8 5 6   ALKPHOS 55 44 38 38  BILITOT 0.6 0.6 0.5 0.8  PROT 6.8 5.4* 5.4* 5.4*  ALBUMIN 3.9 2.8* 2.8* 2.6*   No results for input(s): "LIPASE", "AMYLASE" in the last 168 hours. No results for input(s): "AMMONIA" in the last 168 hours. Coagulation Profile: No results for input(s): "INR", "PROTIME" in the last 168 hours. Cardiac Enzymes: No results for input(s): "CKTOTAL", "CKMB", "CKMBINDEX", "TROPONINI" in the last 168 hours. BNP (last 3 results) No results for input(s): "PROBNP" in the last 8760 hours. HbA1C: No results for input(s): "HGBA1C" in the last 72 hours. CBG: No results for input(s): "GLUCAP" in the last 168 hours. Lipid Profile: No results for input(s): "CHOL", "HDL", "LDLCALC", "TRIG", "CHOLHDL", "LDLDIRECT" in the last 72 hours. Thyroid Function Tests: Recent Labs    01/23/23 0136  TSH 3.519   Anemia Panel: Recent Labs    01/23/23 0136  VITAMINB12 251  FOLATE 6.5  FERRITIN 92  TIBC 265  IRON 22*  RETICCTPCT 1.8   Sepsis Labs: No results for input(s): "PROCALCITON", "LATICACIDVEN" in the last 168 hours.  Recent Results (from the past 240 hour(s))  Surgical PCR screen     Status: None   Collection Time: 01/19/23  4:22 PM   Specimen: Nasal Mucosa; Nasal Swab  Result Value Ref Range Status   MRSA, PCR NEGATIVE NEGATIVE Final   Staphylococcus aureus NEGATIVE NEGATIVE Final    Comment: (NOTE) The Xpert SA Assay (FDA approved for NASAL specimens in patients 45 years of age and older), is one component of a comprehensive surveillance program. It is not intended to diagnose infection nor to guide or monitor treatment. Performed at [facility], [address], [place], [place] [address]     Radiology Studies: No results found.  Scheduled Meds:  aspirin EC  81 mg Oral QHS   atorvastatin  20 mg Oral QHS    docusate sodium  100 mg Oral BID   enoxaparin (LOVENOX) injection  70 mg Subcutaneous Q24H   levothyroxine  88 mcg Oral Q0600   loratadine  10 mg Oral Daily   mupirocin ointment  1 Application Nasal BID   sertraline  50 mg Oral Daily   Continuous Infusions:  methocarbamol (ROBAXIN) IV      LOS: 4 days   Marguerita Merles, DO [facility] Available via Epic secure chat 7am-7pm After these hours, please refer to coverage provider listed on [email or web address] 01/23/2023, 3:11 PM

## 2023-01-23 NOTE — Progress Notes (Signed)
   01/23/23 2108  BiPAP/CPAP/SIPAP  BiPAP/CPAP/SIPAP Pt Type Adult  Reason BIPAP/CPAP not in use Non-compliant   Patient states he has not been wearing CPAP at home. Patient states he does not know if he wants to wear it or not. Pt will call if he wants to wear.

## 2023-01-24 ENCOUNTER — Ambulatory Visit: Payer: Medicare Other

## 2023-01-24 DIAGNOSIS — I10 Essential (primary) hypertension: Secondary | ICD-10-CM | POA: Diagnosis not present

## 2023-01-24 DIAGNOSIS — S7222XA Displaced subtrochanteric fracture of left femur, initial encounter for closed fracture: Secondary | ICD-10-CM | POA: Diagnosis not present

## 2023-01-24 DIAGNOSIS — I480 Paroxysmal atrial fibrillation: Secondary | ICD-10-CM | POA: Diagnosis not present

## 2023-01-24 LAB — COMPREHENSIVE METABOLIC PANEL
ALT: <5 U/L (ref 0–44)
AST: 13 U/L — ABNORMAL LOW (ref 15–41)
Albumin: 2.6 g/dL — ABNORMAL LOW (ref 3.5–5.0)
Alkaline Phosphatase: 37 U/L — ABNORMAL LOW (ref 38–126)
Anion gap: 6 (ref 5–15)
BUN: 33 mg/dL — ABNORMAL HIGH (ref 8–23)
CO2: 30 mmol/L (ref 22–32)
Calcium: 8.4 mg/dL — ABNORMAL LOW (ref 8.9–10.3)
Chloride: 102 mmol/L (ref 98–111)
Creatinine, Ser: 1.45 mg/dL — ABNORMAL HIGH (ref 0.61–1.24)
GFR, Estimated: 51 mL/min — ABNORMAL LOW (ref >60)
Glucose, Bld: 115 mg/dL — ABNORMAL HIGH (ref 70–99)
Potassium: 4.2 mmol/L (ref 3.5–5.1)
Sodium: 138 mmol/L (ref 135–145)
Total Bilirubin: 0.8 mg/dL (ref 0.3–1.2)
Total Protein: 5.3 g/dL — ABNORMAL LOW (ref 6.5–8.1)

## 2023-01-24 LAB — CBC WITH DIFFERENTIAL/PLATELET
Abs Immature Granulocytes: 0.25 10*3/uL — ABNORMAL HIGH (ref 0.00–0.07)
Basophils Absolute: 0.1 10*3/uL (ref 0.0–0.1)
Basophils Relative: 1 %
Eosinophils Absolute: 0.7 10*3/uL — ABNORMAL HIGH (ref 0.0–0.5)
Eosinophils Relative: 10 %
HCT: 33.3 % — ABNORMAL LOW (ref 39.0–52.0)
Hemoglobin: 10.1 g/dL — ABNORMAL LOW (ref 13.0–17.0)
Immature Granulocytes: 3 %
Lymphocytes Relative: 8 %
Lymphs Abs: 0.6 10*3/uL — ABNORMAL LOW (ref 0.7–4.0)
MCH: 26 pg (ref 26.0–34.0)
MCHC: 30.3 g/dL (ref 30.0–36.0)
MCV: 85.6 fL (ref 80.0–100.0)
Monocytes Absolute: 0.9 10*3/uL (ref 0.1–1.0)
Monocytes Relative: 12 %
Neutro Abs: 4.9 10*3/uL (ref 1.7–7.7)
Neutrophils Relative %: 66 %
Platelets: 226 10*3/uL (ref 150–400)
RBC: 3.89 MIL/uL — ABNORMAL LOW (ref 4.22–5.81)
RDW: 15.1 % (ref 11.5–15.5)
WBC: 7.3 10*3/uL (ref 4.0–10.5)
nRBC: 0 % (ref 0.0–0.2)

## 2023-01-24 LAB — MAGNESIUM: Magnesium: 2 mg/dL (ref 1.7–2.4)

## 2023-01-24 LAB — PHOSPHORUS: Phosphorus: 2.5 mg/dL (ref 2.5–4.6)

## 2023-01-24 MED ORDER — APIXABAN 2.5 MG PO TABS: 2.5000 mg | ORAL_TABLET | Freq: Two times a day (BID) | ORAL | 0 refills | Status: DC

## 2023-01-24 MED ORDER — METHOCARBAMOL 500 MG PO TABS: 500.0000 mg | ORAL_TABLET | Freq: Four times a day (QID) | ORAL | 0 refills | Status: DC | PRN

## 2023-01-24 MED ORDER — IPRATROPIUM BROMIDE 0.06 % NA SOLN
2.0000 | Freq: Three times a day (TID) | NASAL | Status: DC | PRN
  Filled 2023-01-24: qty 15

## 2023-01-24 MED ORDER — HYDROCODONE-ACETAMINOPHEN 5-325 MG PO TABS: 1.0000 | ORAL_TABLET | Freq: Four times a day (QID) | ORAL | 0 refills | Status: DC | PRN

## 2023-01-24 NOTE — TOC Progression Note (Addendum)
Transition of Care [facility]) - Progression Note    Patient Details  Name: Christopher [name] MRN: [id number] Date of Birth: [birth year]  [facility] CM/SW Contact  Lorri Frederick, LCSW Phone Number: 01/24/2023, 10:45 AM  Clinical Narrative:   Bed offers provided to pt, he wants to discuss with his daughter.  1300:  CSW met with pt and daughter in room.  They would like to accept offer at [facility] in [place].   Waiting on confirmation from [facility].    1330: [place] confirmed.  1415: Auth request submitted in Navi and approved: [id number], 3 days, 6/3-6/5.  Recontacted [address]/[facility], they can receive pt tomorrow.      Expected Discharge Plan: Skilled Nursing Facility Barriers to Discharge: Continued Medical Work up, SNF Pending bed offer  Expected Discharge Plan and Services In-house Referral: Clinical Social Work   Post Acute Care Choice: Skilled Nursing Facility Living arrangements for the past 2 months: Single Family Home                                       Social Determinants of Health (SDOH) Interventions SDOH Screenings   Food Insecurity: No Food Insecurity (01/19/2023)  Housing: Low Risk  (01/19/2023)  Transportation Needs: No Transportation Needs (01/19/2023)  Utilities: Not At Risk (01/19/2023)  Depression (PHQ2-9): Low Risk  (10/20/2022)  Tobacco Use: Low Risk  (01/21/2023)    Readmission Risk Interventions    04/26/2021   10:16 AM  Readmission Risk Prevention Plan  Transportation Screening Complete  PCP or Specialist Appt within 3-5 Days Complete  HRI or Home Care Consult Complete  Social Work Consult for Recovery Care Planning/Counseling Complete  Palliative Care Screening Not Applicable  Medication Review Oceanographer) Complete

## 2023-01-24 NOTE — Progress Notes (Signed)
Physical Therapy Treatment Patient Details Name: Christopher [name] MRN: [id number] DOB: Mar 05, 1951 Today's Date: 01/24/2023   History of Present Illness 72 y.o. male presents to [facility] hospital on 01/19/2023 with L distal femur fx after fall at home in shower. Pt underwent ORIF of L distal femur fx on 5/30. PMH includes anxiety, CHF, COPD, depression, HLD, HTN.    PT Comments    Participated in bed mobility training. Multiple attempts and techniques for sit<>stand with pt demonstrating ability to clear buttocks but still unable to stand fully upright, even with Stedy for support. Put forth good effort but requires motivation. Reviewed LE exercises to perform regularly in bed to prevent progressive atrophy from immobility. Pt did well with lateral scoot along bed, required assist from bed pad and cues for sequencing/LE placement. Patient will continue to benefit from skilled physical therapy services to further improve independence with functional mobility.    Recommendations for follow up therapy are one component of a multi-disciplinary discharge planning process, led by the attending physician.  Recommendations may be updated based on patient status, additional functional criteria and insurance authorization.  Follow Up Recommendations  Can patient physically be transported by private vehicle: No    Assistance Recommended at Discharge Frequent or constant Supervision/Assistance  Patient can return home with the following Two people to help with walking and/or transfers;Two people to help with bathing/dressing/bathroom;Assistance with cooking/housework;Assist for transportation;Help with stairs or ramp for entrance   Equipment Recommendations   (TBD pending progress)    Recommendations for Other Services       Precautions / Restrictions Precautions Precautions: Fall Restrictions Weight Bearing Restrictions: Yes LLE Weight Bearing: Weight bearing as tolerated     Mobility  Bed  Mobility Overal bed mobility: Needs Assistance Bed Mobility: Supine to Sit, Sit to Supine     Supine to sit: Max assist, HOB elevated Sit to supine: Max assist   General bed mobility comments: Max assist for bed mobility, helicopter approach with use of bed pad to turn in bed, max VC for sequencing, LLE supported by therapist and pt also utilizing gait belt to support. Pt able to scoot hiself up with some assist for bed back using modified bridge in trendelenburg with use of rails to pull up.    Transfers Overall transfer level: Needs assistance Equipment used: Rolling walker (2 wheels), Ambulation equipment used Transfers: Sit to/from Stand Sit to Stand: Total assist, From elevated surface           General transfer comment: Practiced multiple times with fair buttock clearance each time but not able to fully extend knees and back into upright stance. Max  VC and assist for hip to rise. Performed with RW and Stedy without full success. Able to perform lateral scoot along bed with max assist for bed pad scoot and cues for technique.    Ambulation/Gait                   Stairs             Wheelchair Mobility    Modified Rankin (Stroke Patients Only)       Balance Overall balance assessment: Needs assistance Sitting-balance support: No upper extremity supported, Feet supported Sitting balance-Leahy Scale: Fair                                      Cognition Arousal/Alertness: Awake/alert Behavior During Therapy: Flat affect, Agitated  Overall Cognitive Status: Within Functional Limits for tasks assessed                                          Exercises General Exercises - Lower Extremity Ankle Circles/Pumps: AROM, Both, 5 reps, Supine Quad Sets: Strengthening, Both, 5 reps, Supine Gluteal Sets: Strengthening, Both, 5 reps, Supine    General Comments General comments (skin integrity, edema, etc.): On 2L supplemental O2  in upper 90s.      Pertinent Vitals/Pain Pain Assessment Pain Assessment: Faces Faces Pain Scale: Hurts whole lot Pain Location: Lt knee Pain Descriptors / Indicators: Grimacing, Guarding, Sharp Pain Intervention(s): Monitored during session, Limited activity within patient's tolerance, Repositioned    Home Living Family/patient expects to be discharged to:: Private residence Living Arrangements: Children Available Help at Discharge: Family;Available 24 hours/day Type of Home: House Home Access: Stairs to enter Entrance Stairs-Rails: Can reach both Entrance Stairs-Number of Steps: 3   Home Layout: Multi-level;Able to live on main level with bedroom/bathroom Home Equipment: Rolling Walker (2 wheels);Wheelchair - Careers adviser (comment);Shower seat;BSC/3in1      Prior Function            PT Goals (current goals can now be found in the care plan section) Acute Rehab PT Goals Patient Stated Goal: to reduce pain and assistance requirements when mobilizing PT Goal Formulation: With patient Time For Goal Achievement: 02/04/23 Potential to Achieve Goals: Fair Progress towards PT goals: Progressing toward goals    Frequency    Min 3X/week      PT Plan Current plan remains appropriate    Co-evaluation              AM-PAC PT "6 Clicks" Mobility   Outcome Measure  Help needed turning from your back to your side while in a flat bed without using bedrails?: A Lot Help needed moving from lying on your back to sitting on the side of a flat bed without using bedrails?: A Lot Help needed moving to and from a bed to a chair (including a wheelchair)?: Total Help needed standing up from a chair using your arms (e.g., wheelchair or bedside chair)?: Total Help needed to walk in hospital room?: Total Help needed climbing 3-5 steps with a railing? : Total 6 Click Score: 8    End of Session Equipment Utilized During Treatment: Gait belt;Oxygen Activity Tolerance: Patient  limited by fatigue Patient left: in bed;with call bell/phone within reach;with bed alarm set;with family/visitor present   PT Visit Diagnosis: Other abnormalities of gait and mobility (R26.89);Muscle weakness (generalized) (M62.81);Pain Pain - Right/Left: Left Pain - part of body: Knee     Time: 1610-9604 PT Time Calculation (min) (ACUTE ONLY): 45 min  Charges:  $Therapeutic Activity: 38-52 mins                     Kathlyn Sacramento, PT, DPT Physical Therapist Acute Rehabilitation Services [facility] [phone]    Berton Mount 01/24/2023, 2:14 PM

## 2023-01-24 NOTE — Progress Notes (Signed)
PROGRESS NOTE    Christopher [name]  RSW:[id number] DOB: Aug 14, 1951 DOA: 01/19/2023 PCP: Danella Penton, MD   Brief Narrative:  Christopher [name] is a 72 y.o. male with medical history significant for CKD stage III, morbid obesity, hypertension, hyperlipidemia, COPD on oxygen as needed being admitted to the hospital with left distal femur fracture.  Patient is wheelchair-bound at baseline, walks a few steps with rolling walker at home.  Lives with his daughter who helps take care of him.  He has been in his usual state of health until late last night when he slipped getting out of the shower.  Immediate left knee pain and inability to bear any weight.  Was brought to the ED at [facility] where x-rays showed a distal femur fracture.  Orthopedic surgery was consulted, recommended transfer to [facility] for orthopedic trauma evaluation.    He underwent an open reduction internal fixation of a left supracondylar distal femur fracture on 01/20/2023 and is postoperative day 4.  PT OT evaluated and recommending SNF and patient's was given bed offers and he excepted the [facility] and bed is available but insurance authorization is pending and the facility can take the patient tomorrow on 01/25/2023.  Pain is fairly well-controlled.      Assessment and Plan:  Closed left subtrochanteric femur fracture, initial encounter (HCC) status post open reduction internal fixation of left supracondylar distal femur fracture POD 4 -Inpatient admission to MedSurg -CT knee done and showed "Acute comminuted impacted fracture of the distal femoral metaphysis as described above. Old healed fracture of the proximal fibula. Tricompartmental osteoarthritis." -Received Tranexamic Acid  -Patient has a leukocytosis of 19.9 which is now improved -Pain control with Acetaminophen 325-650 mg po q6hprn Mild Pain, Hydrocodone-Acetaminophen 1-2 tabs orally every 6 hours as needed for moderate-severe pain and have changed his IV  morphine to IV fentanyl 12.5-25 mcg every 2 hours as needed for breakthrough pain -Nonweightbearing status currently and further weightbearing status per orthopedic surgery -Has been seen by orthopedic surgery and was taken for ORIF on 01/20/23 -Was n.p.o. at midnight but now on a diet -Orthopedic surgery recommending weightbearing as tolerated left lower extremity and okay for unrestricted range of motion -For discharge they are recommending acetaminophen versus oxycodone for pain control and recommending Eliquis 2.5 mg twice daily for 30 days for DVT prophylaxis -Orthopedic Surgery removed and changed the patient's left lower extremity dressings on 01/22/2023 and recommending Lovenox for VTE prophylaxis for now -PT OT recommending SNF and TOC consulted for assistance with placement as patient is agreeable given his lack of mobility    Peripheral Edema in the setting of Acute on Chronic Diastolic Dysfunction, improved -last echo in our system from 2022 with preserved EF indeterminate diastolic parameters.   -Chest x-ray with concern for possible pulmonary edema. -Received a dose of IV Lasix on the day of Admission -BNP was 72.1 but obese patient is not very short -Obtain 2D echo and this was done and showed an EF of 55 to 60% with grade 1 diastolic dysfunction -Strict I's and O's and Daily Weights.  Intake/Output Summary (Last 24 hours) at 01/24/2023 1456 Last data filed at 01/24/2023 0830 Gross per 24 hour  Intake 200 ml  Output 635 ml  Net -435 ml   -Continue to monitor for signs and symptoms of volume overload -Repeat CMP in the AM -CXR done 01/20/23 and showed "The mediastinal contours are within normal limits. No cardiomegaly. There is mild cephalization of the pulmonary vasculature  and prominence of the vascular structures about the right hilum, slightly decreased from comparison. No focal consolidations, pleural effusion, or pneumothorax. No acute osseous abnormality."   Sleep Apnea with  COPD overlap -Continue supplemental oxygen as needed and wean O2 as tolerated to room air -Continuous pulse oximetry maintain O2 saturation greater than 90% SpO2: 96 % O2 Flow Rate (L/min): 2 L/min -No evidence of acute exacerbation -Continue Xopenex every 4 hours as needed shortness of breath -Will order CPAP for the evening   Hyperlipidemia -C/w Atorvastatin 20 mg po qHS   Hypothyroidism -Checked TSH was 3.519 -C/w Levothyroxine 88 mcg po Daily   Leukocytosis -In the setting of surgery and likely reactive. WBC Trend: Recent Labs  Lab 01/19/23 0754 01/20/23 0130 01/21/23 0307 01/22/23 0107 01/23/23 0136 01/24/23 0920  WBC 19.9* 9.5 11.9* 9.6 8.2 7.3  -Continue to Monitor for S/Sx of Infection and Repeat CBC in the AM   Benign Essential Hypertension -Was previously on midodrine at home, but this is normotensive -Continue to Monitor BP per Protocol -Last BP reading was 109/58   AF (Paroxysmal Atrial Fibrillation) (HCC) -Currently appears to be in sinus rhythm -C/w Metoprolol Tartrate 5 mg IV q6hprn HBP -If necessary will place on telemetry   CKD Stage 3b -BUN/Cr Trend: Recent Labs  Lab 01/19/23 0754 01/20/23 0130 01/21/23 0307 01/22/23 0107 01/23/23 0136 01/24/23 0920  BUN 57* 46* 49* 47* 41* 33*  CREATININE 1.75* 2.04* 1.89* 1.83* 1.66* 1.45*  -Avoid Nephrotoxic Medications, Contrast Dyes, Hypotension and Dehydration to Ensure Adequate Renal Perfusion and will need to Renally Adjust Meds -Continue to Monitor and Trend Renal Function carefully and repeat CMP in the AM    Acute Blood Loss Anemia as a postoperative cause -Hgb/Hct Trend: Recent Labs  Lab 01/19/23 0754 01/20/23 0130 01/21/23 0307 01/22/23 0107 01/23/23 0136 01/24/23 0920  HGB 13.4 13.6 10.9* 10.2* 9.8* 10.1*  HCT 44.2 43.9 35.0* 33.8* 32.1* 33.3*  MCV 85.7 85.9 85.0 85.1 85.4 85.6  -Checked Anemia Panel and showed an iron level of 22, UIBC of 243, TIBC of 265, saturation ratios of 8%,  ferritin level 92, folate 6.5 and vitamin B12 level 251 -Continue to monitor for signs and symptoms of bleeding; no overt bleeding noted -Patient having his dressing change tomorrow -Repeat CBC in a.m.   Hypoalbuminemia -Patient's Albumin Trend: Recent Labs  Lab 01/19/23 0754 01/21/23 0307 01/22/23 0107 01/23/23 0136 01/24/23 0920  ALBUMIN 3.9 2.8* 2.8* 2.6* 2.6*  -Continue to Monitor and Trend and repeat CMP in the AM    Morbid Obesity with BMI of 45.0-49.9, adult (HCC) -Complicates overall prognosis and care -Estimated body mass index is 45.24 kg/m as calculated from the following:   Height as of this encounter: 6' (1.829 m).   Weight as of this encounter: 151.3 kg.  -Weight Loss and Dietary Counseling given   DVT prophylaxis: SCDs Start: 01/20/23 1029 SCDs Start: 01/19/23 1617    Code Status: Full Code Family Communication: No family present at bedside  Disposition Plan:  Level of care: Med-Surg Status is: Inpatient Remains inpatient appropriate because: Medically stable to D/C to SNF but cannot go to the facility until 01/25/23   Consultants:  Orthopedic Surgery   Procedures:  Procedures: CPT 27511-Open reduction internal fixation of left supracondylar distal femur fracture  ECHOCARDIOGRAM IMPRESSIONS     1. Left ventricular ejection fraction, by estimation, is 55 to 60%. The  left ventricle has normal function. The left ventricle has no regional  wall motion abnormalities. Left  ventricular diastolic parameters are  consistent with Grade I diastolic  dysfunction (impaired relaxation).   2. Right ventricular systolic function is normal. The right ventricular  size is mildly enlarged.   3. The mitral valve is grossly normal. Trivial mitral valve  regurgitation. No evidence of mitral stenosis.   4. The aortic valve is tricuspid. Aortic valve regurgitation is not  visualized. Aortic valve sclerosis/calcification is present, without any  evidence of aortic  stenosis.   5. Aortic dilatation noted. There is mild dilatation of the ascending  aorta, measuring 40 mm.   6. The inferior vena cava is normal in size with greater than 50%  respiratory variability, suggesting right atrial pressure of 3 mmHg.   Comparison(s): No significant change from prior study.   FINDINGS   Left Ventricle: Left ventricular ejection fraction, by estimation, is 55  to 60%. The left ventricle has normal function. The left ventricle has no  regional wall motion abnormalities. The left ventricular internal cavity  size was normal in size. There is   no left ventricular hypertrophy. Left ventricular diastolic parameters  are consistent with Grade I diastolic dysfunction (impaired relaxation).   Right Ventricle: The right ventricular size is mildly enlarged. Right  vetricular wall thickness was not well visualized. Right ventricular  systolic function is normal.   Left Atrium: Left atrial size was normal in size.   Right Atrium: Right atrial size was normal in size.   Pericardium: There is no evidence of pericardial effusion.   Mitral Valve: The mitral valve is grossly normal. Trivial mitral valve  regurgitation. No evidence of mitral valve stenosis.   Tricuspid Valve: The tricuspid valve is normal in structure. Tricuspid  valve regurgitation is not demonstrated.   Aortic Valve: The aortic valve is tricuspid. Aortic valve regurgitation is  not visualized. Aortic valve sclerosis/calcification is present, without  any evidence of aortic stenosis. Aortic valve peak gradient measures 8.8  mmHg.   Pulmonic Valve: The pulmonic valve was not well visualized. Pulmonic valve  regurgitation is trivial.   Aorta: Aortic dilatation noted. There is mild dilatation of the ascending  aorta, measuring 40 mm.   Venous: The inferior vena cava is normal in size with greater than 50%  respiratory variability, suggesting right atrial pressure of 3 mmHg.   IAS/Shunts: The  atrial septum is grossly normal.     LEFT VENTRICLE  PLAX 2D  LVIDd:         6.00 cm   Diastology  LVIDs:         4.20 cm   LV e' medial:    8.08 cm/s  LV PW:         1.10 cm   LV E/e' medial:  7.8  LV IVS:        0.90 cm   LV e' lateral:   8.86 cm/s  LVOT diam:     2.50 cm   LV E/e' lateral: 7.1  LV SV:         79  LV SV Index:   30  LVOT Area:     4.91 cm     RIGHT VENTRICLE             IVC  RV S prime:     19.10 cm/s  IVC diam: 1.90 cm  TAPSE (M-mode): 2.8 cm   LEFT ATRIUM           Index        RIGHT ATRIUM  Index  LA diam:      3.60 cm 1.36 cm/m   RA Area:     19.40 cm  LA Vol (A2C): 52.0 ml 19.64 ml/m  RA Volume:   54.60 ml  20.62 ml/m  LA Vol (A4C): 47.9 ml 18.09 ml/m   AORTIC VALVE  AV Area (Vmax): 3.44 cm  AV Vmax:        148.00 cm/s  AV Peak Grad:   8.8 mmHg  LVOT Vmax:      103.57 cm/s  LVOT Vmean:     64.167 cm/s  LVOT VTI:       0.161 m    AORTA  Ao Root diam: 3.90 cm  Ao Asc diam:  4.00 cm   MITRAL VALVE  MV Area (PHT): 4.21 cm    SHUNTS  MV Decel Time: 180 msec    Systemic VTI:  0.16 m  MR Peak grad: 6.5 mmHg     Systemic Diam: 2.50 cm  MR Vmax:      127.00 cm/s  MV E velocity: 62.90 cm/s  MV A velocity: 88.60 cm/s  MV E/A ratio:  0.71   Antimicrobials:  Anti-infectives (From admission, onward)    Start     Dose/Rate Route Frequency Ordered Stop   01/20/23 1400  ceFAZolin (ANCEF) IVPB 2g/100 mL premix        2 g 200 mL/hr over 30 Minutes Intravenous Every 8 hours 01/20/23 1028 01/21/23 0620   01/20/23 0841  vancomycin (VANCOCIN) powder  Status:  Discontinued          As needed 01/20/23 0842 01/20/23 0911       Subjective: Seen and examined at bedside he thinks he is doing okay and pain is controlled.  No nausea or vomiting.  Still waiting for bowel movement to happen.  No nausea or vomiting.  Feels fatigued.  Deciding about SNF in the morning.  Objective: Vitals:   01/23/23 1424 01/23/23 2108 01/24/23 0733 01/24/23 1317   BP: 105/66  106/63 (!) 109/58  Pulse:  75 81 90  Resp: 17  18 18   Temp: 98.3 F (36.8 C)  98 F (36.7 C) 98.3 F (36.8 C)  TempSrc:   Oral Oral  SpO2: 94% 98% 90% 96%  Weight:      Height:        Intake/Output Summary (Last 24 hours) at 01/24/2023 1501 Last data filed at 01/24/2023 0830 Gross per 24 hour  Intake 200 ml  Output 635 ml  Net -435 ml   Filed Weights   01/19/23 1534  Weight: (!) 151.3 kg   Examination: Physical Exam:  Constitutional: WN/WD morbidly obese Caucasian male in no acute distress appears calm Respiratory: Diminished to auscultation bilaterally with coarse breath sounds, no wheezing, rales, rhonchi or crackles. Normal respiratory effort and patient is not tachypenic. No accessory muscle use.  Unlabored breathing Cardiovascular: RRR, no murmurs / rubs / gallops. S1 and S2 auscultated.  Left leg is wrapped and has some slight lower extremity edema Abdomen: Soft, non-tender, distended secondary to body habitus. Bowel sounds positive.  GU: Deferred. Musculoskeletal: No clubbing / cyanosis of digits/nails.  Left leg is wrapped Skin: No rashes, lesions, ulcers on limited skin evaluation. No induration; Warm and dry.  Neurologic: CN 2-12 grossly intact with no focal deficits. Romberg sign and cerebellar reflexes not assessed.  Psychiatric: Normal judgment and insight. Alert and oriented x 3. Normal mood and appropriate affect.   Data Reviewed: I have personally reviewed following labs  and imaging studies  CBC: Recent Labs  Lab 01/19/23 0754 01/20/23 0130 01/21/23 0307 01/22/23 0107 01/23/23 0136 01/24/23 0920  WBC 19.9* 9.5 11.9* 9.6 8.2 7.3  NEUTROABS 17.2*  --  9.2*  --  5.2 4.9  HGB 13.4 13.6 10.9* 10.2* 9.8* 10.1*  HCT 44.2 43.9 35.0* 33.8* 32.1* 33.3*  MCV 85.7 85.9 85.0 85.1 85.4 85.6  PLT 258 229 209 203 210 226   Basic Metabolic Panel: Recent Labs  Lab 01/20/23 0130 01/21/23 0307 01/22/23 0107 01/23/23 0136 01/24/23 0920  NA 139 138  138 137 138  K 4.2 4.3 4.3 4.3 4.2  CL 100 102 102 102 102  CO2 28 28 28 29 30   GLUCOSE 104* 128* 106* 106* 115*  BUN 46* 49* 47* 41* 33*  CREATININE 2.04* 1.89* 1.83* 1.66* 1.45*  CALCIUM 8.8* 8.2* 8.2* 8.2* 8.4*  MG  --  2.2 2.1 2.0 2.0  PHOS  --  3.5 3.6 3.4 2.5   GFR: Estimated Creatinine Clearance: 69.8 mL/min (A) (by C-G formula based on SCr of 1.45 mg/dL (H)). Liver Function Tests: Recent Labs  Lab 01/19/23 0754 01/21/23 0307 01/22/23 0107 01/23/23 0136 01/24/23 0920  AST 19 12* 13* 9* 13*  ALT 11 8 5 6  <5  ALKPHOS 55 44 38 38 37*  BILITOT 0.6 0.6 0.5 0.8 0.8  PROT 6.8 5.4* 5.4* 5.4* 5.3*  ALBUMIN 3.9 2.8* 2.8* 2.6* 2.6*   No results for input(s): "LIPASE", "AMYLASE" in the last 168 hours. No results for input(s): "AMMONIA" in the last 168 hours. Coagulation Profile: No results for input(s): "INR", "PROTIME" in the last 168 hours. Cardiac Enzymes: No results for input(s): "CKTOTAL", "CKMB", "CKMBINDEX", "TROPONINI" in the last 168 hours. BNP (last 3 results) No results for input(s): "PROBNP" in the last 8760 hours. HbA1C: No results for input(s): "HGBA1C" in the last 72 hours. CBG: No results for input(s): "GLUCAP" in the last 168 hours. Lipid Profile: No results for input(s): "CHOL", "HDL", "LDLCALC", "TRIG", "CHOLHDL", "LDLDIRECT" in the last 72 hours. Thyroid Function Tests: Recent Labs    01/23/23 0136  TSH 3.519   Anemia Panel: Recent Labs    01/23/23 0136  VITAMINB12 251  FOLATE 6.5  FERRITIN 92  TIBC 265  IRON 22*  RETICCTPCT 1.8   Sepsis Labs: No results for input(s): "PROCALCITON", "LATICACIDVEN" in the last 168 hours.  Recent Results (from the past 240 hour(s))  Surgical PCR screen     Status: None   Collection Time: 01/19/23  4:22 PM   Specimen: Nasal Mucosa; Nasal Swab  Result Value Ref Range Status   MRSA, PCR NEGATIVE NEGATIVE Final   Staphylococcus aureus NEGATIVE NEGATIVE Final    Comment: (NOTE) The Xpert SA Assay (FDA  approved for NASAL specimens in patients 52 years of age and older), is one component of a comprehensive surveillance program. It is not intended to diagnose infection nor to guide or monitor treatment. Performed at [facility], [address], [place], [place] [address]     Radiology Studies: No results found.  Scheduled Meds:  aspirin EC  81 mg Oral QHS   atorvastatin  20 mg Oral QHS   docusate sodium  100 mg Oral BID   enoxaparin (LOVENOX) injection  70 mg Subcutaneous Q24H   levothyroxine  88 mcg Oral Q0600   loratadine  10 mg Oral Daily   sertraline  50 mg Oral Daily   Continuous Infusions:  methocarbamol (ROBAXIN) IV  LOS: 5 days   Marguerita Merles, DO [facility] Available via Epic secure chat 7am-7pm After these hours, please refer to coverage provider listed on [email or web address] 01/24/2023, 3:01 PM

## 2023-01-25 ENCOUNTER — Inpatient Hospital Stay (HOSPITAL_COMMUNITY): Payer: Medicare Other

## 2023-01-25 DIAGNOSIS — S7222XA Displaced subtrochanteric fracture of left femur, initial encounter for closed fracture: Secondary | ICD-10-CM | POA: Diagnosis not present

## 2023-01-25 DIAGNOSIS — E039 Hypothyroidism, unspecified: Secondary | ICD-10-CM | POA: Diagnosis not present

## 2023-01-25 DIAGNOSIS — I480 Paroxysmal atrial fibrillation: Secondary | ICD-10-CM | POA: Diagnosis not present

## 2023-01-25 DIAGNOSIS — I10 Essential (primary) hypertension: Secondary | ICD-10-CM | POA: Diagnosis not present

## 2023-01-25 LAB — COMPREHENSIVE METABOLIC PANEL
ALT: 8 U/L (ref 0–44)
AST: 12 U/L — ABNORMAL LOW (ref 15–41)
Albumin: 2.5 g/dL — ABNORMAL LOW (ref 3.5–5.0)
Alkaline Phosphatase: 37 U/L — ABNORMAL LOW (ref 38–126)
Anion gap: 7 (ref 5–15)
BUN: 32 mg/dL — ABNORMAL HIGH (ref 8–23)
CO2: 31 mmol/L (ref 22–32)
Calcium: 8.4 mg/dL — ABNORMAL LOW (ref 8.9–10.3)
Chloride: 100 mmol/L (ref 98–111)
Creatinine, Ser: 1.5 mg/dL — ABNORMAL HIGH (ref 0.61–1.24)
GFR, Estimated: 49 mL/min — ABNORMAL LOW (ref >60)
Glucose, Bld: 118 mg/dL — ABNORMAL HIGH (ref 70–99)
Potassium: 4.3 mmol/L (ref 3.5–5.1)
Sodium: 138 mmol/L (ref 135–145)
Total Bilirubin: 1.1 mg/dL (ref 0.3–1.2)
Total Protein: 5.1 g/dL — ABNORMAL LOW (ref 6.5–8.1)

## 2023-01-25 LAB — CBC WITH DIFFERENTIAL/PLATELET
Abs Immature Granulocytes: 0.36 10*3/uL — ABNORMAL HIGH (ref 0.00–0.07)
Basophils Absolute: 0.1 10*3/uL (ref 0.0–0.1)
Basophils Relative: 1 %
Eosinophils Absolute: 0.8 10*3/uL — ABNORMAL HIGH (ref 0.0–0.5)
Eosinophils Relative: 10 %
HCT: 33.1 % — ABNORMAL LOW (ref 39.0–52.0)
Hemoglobin: 10.1 g/dL — ABNORMAL LOW (ref 13.0–17.0)
Immature Granulocytes: 5 %
Lymphocytes Relative: 8 %
Lymphs Abs: 0.6 10*3/uL — ABNORMAL LOW (ref 0.7–4.0)
MCH: 26.2 pg (ref 26.0–34.0)
MCHC: 30.5 g/dL (ref 30.0–36.0)
MCV: 85.8 fL (ref 80.0–100.0)
Monocytes Absolute: 1 10*3/uL (ref 0.1–1.0)
Monocytes Relative: 12 %
Neutro Abs: 5.2 10*3/uL (ref 1.7–7.7)
Neutrophils Relative %: 64 %
Platelets: 227 10*3/uL (ref 150–400)
RBC: 3.86 MIL/uL — ABNORMAL LOW (ref 4.22–5.81)
RDW: 15.2 % (ref 11.5–15.5)
WBC: 7.9 10*3/uL (ref 4.0–10.5)
nRBC: 0 % (ref 0.0–0.2)

## 2023-01-25 LAB — MAGNESIUM: Magnesium: 1.9 mg/dL (ref 1.7–2.4)

## 2023-01-25 LAB — PHOSPHORUS: Phosphorus: 3.3 mg/dL (ref 2.5–4.6)

## 2023-01-25 MED ORDER — SENNOSIDES-DOCUSATE SODIUM 8.6-50 MG PO TABS: 1.0000 | ORAL_TABLET | Freq: Two times a day (BID) | ORAL | Status: AC

## 2023-01-25 MED ORDER — BISACODYL 10 MG RE SUPP: 10.0000 mg | Freq: Every day | RECTAL | 0 refills | Status: AC | PRN

## 2023-01-25 MED ORDER — POLYETHYLENE GLYCOL 3350 17 G PO PACK: 17.0000 g | PACK | Freq: Two times a day (BID) | ORAL | 0 refills | Status: DC

## 2023-01-25 MED ORDER — ONDANSETRON HCL 4 MG PO TABS: 4.0000 mg | ORAL_TABLET | Freq: Four times a day (QID) | ORAL | 0 refills | Status: DC | PRN

## 2023-01-25 MED ORDER — SENNOSIDES-DOCUSATE SODIUM 8.6-50 MG PO TABS
1.0000 | ORAL_TABLET | Freq: Two times a day (BID) | ORAL | Status: DC
  Administered 2023-01-25: 1 via ORAL
  Filled 2023-01-25: qty 1

## 2023-01-25 MED ORDER — ALUM & MAG HYDROXIDE-SIMETH 200-200-20 MG/5ML PO SUSP: 15.0000 mL | Freq: Four times a day (QID) | ORAL | 0 refills | Status: DC | PRN

## 2023-01-25 MED ORDER — MIDODRINE HCL 10 MG PO TABS: 10.0000 mg | ORAL_TABLET | Freq: Three times a day (TID) | ORAL | 0 refills | Status: DC | PRN

## 2023-01-25 MED ORDER — BISACODYL 10 MG RE SUPP
10.0000 mg | Freq: Every day | RECTAL | Status: DC | PRN
  Administered 2023-01-25: 10 mg via RECTAL
  Filled 2023-01-25: qty 1

## 2023-01-25 MED ORDER — POLYETHYLENE GLYCOL 3350 17 G PO PACK: 17.0000 g | PACK | Freq: Two times a day (BID) | ORAL | Status: DC

## 2023-01-25 NOTE — Care Management Important Message (Signed)
Important Message  Patient Details  Name: Christopher [name] MRN: [id number] Date of Birth: [birth year]   Medicare Important Message Given:  Yes     Sherilyn Banker 01/25/2023, 4:26 PM

## 2023-01-25 NOTE — Discharge Summary (Addendum)
Physician Discharge Summary   Patient: Clarkson Czaplicki MRN: [id number] DOB: 1951-05-01  Admit date:     01/19/2023  Discharge date: 01/25/23  Discharge Physician: Merlene Laughter   PCP: Danella Penton, MD   Recommendations at discharge:    Follow up with PCP within 1-2 weeks and repeat CBC,CMP,Mag,Phos within 1 week Continue WBAT LLE with unrestricted ROM and follow up with Orthopedic Surgery within 1-2 weeks  Discharge Diagnoses: Principal Problem:   Closed left subtrochanteric femur fracture, initial encounter (HCC) Active Problems:   Sleep apnea   Morbid obesity with BMI of 45.0-49.9, adult (HCC)   Hyperlipidemia   Hypothyroidism   Stage 3b chronic kidney disease (CKD) (HCC)   Benign essential hypertension   AF (paroxysmal atrial fibrillation) (HCC)  Resolved Problems:   * No resolved hospital problems. *  Hospital Course: Gumecindo Kehoe is a 72 y.o. male with medical history significant for CKD stage III, morbid obesity, hypertension, hyperlipidemia, COPD on oxygen as needed being admitted to the hospital with left distal femur fracture.  Patient is wheelchair-bound at baseline, walks a few steps with rolling walker at home.  Lives with his daughter who helps take care of him.  He has been in his usual state of health until late last night when he slipped getting out of the shower.  Immediate left knee pain and inability to bear any weight.  Was brought to the ED at [facility] where x-rays showed a distal femur fracture.  Orthopedic surgery was consulted, recommended transfer to [facility] for orthopedic trauma evaluation.    He underwent an open reduction internal fixation of a left supracondylar distal femur fracture on 01/20/2023 and is postoperative day 5.  PT OT evaluated and recommending SNF and patient's was given bed offers and he accepted [facility] and the facility can take the patient today on 01/25/2023.  Pain is fairly well-controlled.   He is medically stable for D/C and  will need to follow up with PCP and Orthopedic Surgery within 1-2 weeks.   Assessment and Plan:  Closed left subtrochanteric femur fracture, initial encounter (HCC) status post open reduction internal fixation of left supracondylar distal femur fracture POD 5 -Inpatient admission to MedSurg -CT knee done and showed "Acute comminuted impacted fracture of the distal femoral metaphysis as described above. Old healed fracture of the proximal fibula. Tricompartmental osteoarthritis." -Received Tranexamic Acid  -Patient has a leukocytosis of 19.9 which is now improved -Pain control with Acetaminophen 325-650 mg po q6hprn Mild Pain, Hydrocodone-Acetaminophen 1-2 tabs orally every 6 hours as needed for moderate-severe pain and have changed his IV morphine to IV fentanyl 12.5-25 mcg every 2 hours as needed for breakthrough pain -Nonweightbearing status currently and further weightbearing status per orthopedic surgery -Has been seen by orthopedic surgery and was taken for ORIF on 01/20/23 -Was n.p.o. at midnight but now on a diet -Orthopedic surgery recommending weightbearing as tolerated left lower extremity and okay for unrestricted range of motion -For discharge they are recommending acetaminophen versus oxycodone for pain control and recommending Eliquis 2.5 mg twice daily for 30 days for DVT prophylaxis -Orthopedic Surgery removed and changed the patient's left lower extremity dressings on 01/22/2023 and recommending Lovenox for VTE prophylaxis for now -PT OT recommending SNF and TOC consulted for assistance with placement as patient is agreeable given his lack of mobility    Peripheral Edema in the setting of Acute on Chronic Diastolic Dysfunction, improved -last echo in our system from 2022 with preserved EF indeterminate diastolic  parameters.   -Chest x-ray with concern for possible pulmonary edema. -Received a dose of IV Lasix on the day of Admission -BNP was 72.1 but obese patient is not very  short -Obtain 2D echo and this was done and showed an EF of 55 to 60% with grade 1 diastolic dysfunction -Strict I's and O's and Daily Weights.  Intake/Output Summary (Last 24 hours) at 01/25/2023 1121 Last data filed at 01/25/2023 0900 Gross per 24 hour  Intake 240 ml  Output 600 ml  Net -360 ml   -Continue to monitor for signs and symptoms of volume overload -Repeat CMP in the AM -CXR done 01/20/23 and showed "The mediastinal contours are within normal limits. No cardiomegaly. There is mild cephalization of the pulmonary vasculature and prominence of the vascular structures about the right hilum, slightly decreased from comparison. No focal consolidations, pleural effusion, or pneumothorax. No acute osseous abnormality." -Repeat CXR done and showed "Similar cardiomediastinal silhouette. Both lungs are clear. No visible pleural effusions or pneumothorax. No acute osseous abnormality."   Sleep Apnea with COPD overlap -Continue supplemental oxygen as needed and wean O2 as tolerated to room air -Continuous pulse oximetry maintain O2 saturation greater than 90% SpO2: 98 % O2 Flow Rate (L/min): 2 L/min -No evidence of acute exacerbation -Continue Xopenex every 4 hours as needed shortness of breath -Ordered CPAP qHS   Hyperlipidemia -C/w Atorvastatin 20 mg po qHS   Hypothyroidism -Checked TSH was 3.519 -C/w Levothyroxine 88 mcg po Daily   Leukocytosis -In the setting of surgery and likely reactive. WBC Trend: Recent Labs  Lab 01/19/23 0754 01/20/23 0130 01/21/23 0307 01/22/23 0107 01/23/23 0136 01/24/23 0920 01/25/23 0150  WBC 19.9* 9.5 11.9* 9.6 8.2 7.3 7.9  -Continue to Monitor for S/Sx of Infection and Repeat CBC in the AM   Benign Essential Hypertension -Was previously on midodrine at home, but this is normotensive -Continue to Monitor BP per Protocol -Last BP reading was 104/57   AF (Paroxysmal Atrial Fibrillation) (HCC) -Currently appears to be in sinus rhythm -C/w  Metoprolol Tartrate 5 mg IV q6hprn HBP -If necessary will place on telemetry  Constipation -Bowel Regimen adjusted -Added Senna-Docusate 1 tab po BID, Miralax 17 grams BID, and added Bisacodyl 10 mg RC Suppository   CKD Stage 3b -BUN/Cr Trend: Recent Labs  Lab 01/19/23 0754 01/20/23 0130 01/21/23 0307 01/22/23 0107 01/23/23 0136 01/24/23 0920 01/25/23 0150  BUN 57* 46* 49* 47* 41* 33* 32*  CREATININE 1.75* 2.04* 1.89* 1.83* 1.66* 1.45* 1.50*  -Avoid Nephrotoxic Medications, Contrast Dyes, Hypotension and Dehydration to Ensure Adequate Renal Perfusion and will need to Renally Adjust Meds -Continue to Monitor and Trend Renal Function carefully and repeat CMP within 1 week   Acute Blood Loss Anemia as a postoperative cause -Hgb/Hct Trend: Recent Labs  Lab 01/19/23 0754 01/20/23 0130 01/21/23 0307 01/22/23 0107 01/23/23 0136 01/24/23 0920 01/25/23 0150  HGB 13.4 13.6 10.9* 10.2* 9.8* 10.1* 10.1*  HCT 44.2 43.9 35.0* 33.8* 32.1* 33.3* 33.1*  MCV 85.7 85.9 85.0 85.1 85.4 85.6 85.8  -Checked Anemia Panel and showed an iron level of 22, UIBC of 243, TIBC of 265, saturation ratios of 8%, ferritin level 92, folate 6.5 and vitamin B12 level 251 -Continue to monitor for signs and symptoms of bleeding; no overt bleeding noted -Patient having his dressing change tomorrow -Repeat CBC within 1 week   Hypoalbuminemia -Patient's Albumin Trend: Recent Labs  Lab 01/19/23 0754 01/21/23 0307 01/22/23 0107 01/23/23 0136 01/24/23 0920 01/25/23 0150  ALBUMIN  3.9 2.8* 2.8* 2.6* 2.6* 2.5*  -Continue to Monitor and Trend and repeat CMP in the AM    Morbid Obesity with BMI of 45.0-49.9, adult (HCC) -Complicates overall prognosis and care -Estimated body mass index is 45.24 kg/m as calculated from the following:   Height as of this encounter: 6' (1.829 m).   Weight as of this encounter: 151.3 kg.  -Weight Loss and Dietary Counseling given  Pain control - [place] Controlled  Substance Reporting System database was reviewed. and patient was instructed, not to drive, operate heavy machinery, perform activities at heights, swimming or participation in water activities or provide baby-sitting services while on Pain, Sleep and Anxiety Medications; until their outpatient Physician has advised to do so again. Also recommended to not to take more than prescribed Pain, Sleep and Anxiety Medications.   Consultants: Orthopedic Surgery  Procedures performed: Procedures: CPT 27511-Open reduction internal fixation of left supracondylar distal femur fracture  Disposition: Skilled nursing facility Diet recommendation:  Cardiac and Carb modified diet DISCHARGE MEDICATION: Allergies as of 01/25/2023       Reactions   Propranolol Other (See Comments)   Hallucinations   Albuterol Other (See Comments)   Causes afib    Gramineae Pollens Other (See Comments)   Sneeze and watery eyes Sneeze and watery eyes   Other Other (See Comments)   Other reaction(s): Other (See Comments) Unable to breath when around cats Other reaction(s): Other (See Comments) Unable to breath when around cats        Medication List     STOP taking these medications    traMADol 50 MG tablet Commonly known as: ULTRAM       TAKE these medications    acetaminophen 325 MG tablet Commonly known as: TYLENOL Take 2 tablets (650 mg total) by mouth every 6 (six) hours as needed for mild pain, fever, headache or moderate pain.   alum & mag hydroxide-simeth 200-200-20 MG/5ML suspension Commonly known as: MAALOX/MYLANTA Take 15 mLs by mouth every 6 (six) hours as needed for indigestion or heartburn.   apixaban 2.5 MG Tabs tablet Commonly known as: Eliquis Take 1 tablet (2.5 mg total) by mouth 2 (two) times daily.   aspirin EC 81 MG tablet Take 81 mg by mouth at bedtime.   atorvastatin 20 MG tablet Commonly known as: LIPITOR Take 20 mg by mouth at bedtime.   bisacodyl 10 MG  suppository Commonly known as: DULCOLAX Place 1 suppository (10 mg total) rectally daily as needed for moderate constipation.   buPROPion 150 MG 12 hr tablet Commonly known as: WELLBUTRIN SR Take 150 mg by mouth 2 (two) times daily. After 1st meal of day and after dinner   Cholecalciferol 25 MCG (1000 UT) capsule Take 1,000 Units by mouth at bedtime.   cyanocobalamin 1000 MCG tablet Take 1,000 mcg by mouth 2 (two) times a week. At bedtime. Sundays and Wednesdays   Flovent HFA 110 MCG/ACT inhaler Generic drug: fluticasone Inhale 1 puff into the lungs 2 (two) times daily.   HYDROcodone-acetaminophen 5-325 MG tablet Commonly known as: NORCO/VICODIN Take 1-2 tablets by mouth every 6 (six) hours as needed for moderate pain or severe pain.   ipratropium 0.06 % nasal spray Commonly known as: ATROVENT Place 2 sprays into both nostrils 3 (three) times daily as needed for rhinitis.   levalbuterol 45 MCG/ACT inhaler Commonly known as: XOPENEX HFA Inhale 2 puffs into the lungs every 4 (four) hours as needed for wheezing.   levothyroxine 88 MCG tablet Commonly  known as: SYNTHROID Take 88 mcg by mouth daily. 30 to 60 minutes before breakfast on an empty stomach and with a glass of water   loratadine 10 MG tablet Commonly known as: CLARITIN Take 10 mg by mouth daily.   Magnesium 250 MG Tabs Take 1 tablet by mouth daily.   methocarbamol 500 MG tablet Commonly known as: ROBAXIN Take 1 tablet (500 mg total) by mouth every 6 (six) hours as needed for muscle spasms.   midodrine 10 MG tablet Commonly known as: PROAMATINE Take 1 tablet (10 mg total) by mouth 3 (three) times daily as needed (for SBP < 90mm Hg or DBP < 50 mmHg). What changed:  when to take this reasons to take this   ondansetron 4 MG tablet Commonly known as: ZOFRAN Take 1 tablet (4 mg total) by mouth every 6 (six) hours as needed for nausea.   OXYGEN Inhale 2 L into the lungs continuous.   polyethylene glycol 17 g  packet Commonly known as: MIRALAX / GLYCOLAX Take 17 g by mouth 2 (two) times daily.   potassium chloride SA 20 MEQ tablet Commonly known as: KLOR-CON M Take 1 tablet (20 mEq total) by mouth daily.   PreserVision AREDS 2 Caps Take 1 capsule by mouth 2 (two) times daily. With food. After 1st meal of day and after dinner   senna-docusate 8.6-50 MG tablet Commonly known as: Senokot-S Take 1 tablet by mouth 2 (two) times daily.   sertraline 50 MG tablet Commonly known as: ZOLOFT Take 50 mg by mouth daily.   torsemide 20 MG tablet Commonly known as: DEMADEX Take 1 tablet (20 mg total) by mouth daily.   trolamine salicylate 10 % cream Commonly known as: ASPERCREME Apply 1 application topically as needed for muscle pain. Apply to knees and feet   vitamin A & D ointment Apply 1 application topically as needed. To groin area.        Contact information for follow-up providers     Haddix, Gillie Manners, MD. Schedule an appointment as soon as possible for a visit in 2 week(s).   Specialty: Orthopedic Surgery Why: for wound check, suture removal, repeat x-rays Contact information: [address] [place] [address] [phone]              Contact information for after-discharge care     Destination     [facility] Preferred SNF .   Service: Skilled Nursing Contact information: [address] [place] [address] [phone]                    Discharge Exam: Ceasar Mons Weights   01/19/23 1534  Weight: (!) 151.3 kg   Vitals:   01/25/23 0433 01/25/23 0816  BP: 110/63 (!) 104/57  Pulse: 73 74  Resp: 19   Temp:  98 F (36.7 C)  SpO2: 97% 98%   Examination: Physical Exam:  Constitutional: WN/WD morbidly obese Caucasian male in NAD appears calm Respiratory: Diminished to auscultation bilaterally, no wheezing, rales, rhonchi or crackles. Normal respiratory effort and patient is not tachypenic. No accessory muscle use.  Unlabored breathing  Cardiovascular: RRR, no murmurs / rubs / gallops. S1 and S2 auscultated. Left Leg is wrapped Abdomen: Soft, non-tender, Distended 2/2 body habitus. Bowel sounds positive.  GU: Deferred. Musculoskeletal: No clubbing / cyanosis of digits/nails. Left Leg is wrapped Skin: No rashes, lesions, ulcers on a limited skin evaluation. No induration; Warm and dry.  Neurologic: CN 2-12 grossly intact with  no focal deficits. Romberg sign and cerebellar reflexes not assessed.  Psychiatric: Normal judgment and insight. Alert and oriented x 3. Normal mood and appropriate affect.   Condition at discharge: stable  The results of significant diagnostics from this hospitalization (including imaging, microbiology, ancillary and laboratory) are listed below for reference.   Imaging Studies: DG CHEST PORT 1 VIEW  Result Date: 01/25/2023 CLINICAL DATA:  Shortness of breath. EXAM: PORTABLE CHEST 1 VIEW COMPARISON:  Chest x-ray Jan 21, 2023. FINDINGS: Similar cardiomediastinal silhouette. Both lungs are clear. No visible pleural effusions or pneumothorax. No acute osseous abnormality. IMPRESSION: No active disease. Electronically Signed   By: Feliberto Harts M.D.   On: 01/25/2023 09:46   DG CHEST PORT 1 VIEW  Result Date: 01/21/2023 CLINICAL DATA:  72 year old male with history of shortness of breath. EXAM: PORTABLE CHEST - 1 VIEW COMPARISON:  01/19/2023, 10/17/2021 FINDINGS: The mediastinal contours are within normal limits. No cardiomegaly. There is mild cephalization of the pulmonary vasculature and prominence of the vascular structures about the right hilum, slightly decreased from comparison. No focal consolidations, pleural effusion, or pneumothorax. No acute osseous abnormality. IMPRESSION: Mild pulmonary vascular prominence without evidence of pulmonary edema. Electronically Signed   By: Marliss Coots M.D.   On: 01/21/2023 07:40   ECHOCARDIOGRAM COMPLETE  Result Date: 01/20/2023     ECHOCARDIOGRAM REPORT   Patient Name:   Raybon Gora Date of Exam: 01/20/2023 Medical Rec #:  [id number]      Height:       72.0 in Accession #:    [id number]     Weight:       333.6 lb Date of Birth:  [birth year]      BSA:          2.648 m Patient Age:    71 years       BP:           124/75 mmHg Patient Gender: M              HR:           93 bpm. Exam Location:  Inpatient Procedure: 2D Echo Indications:    Lower extremity edema                 Chronic Diastolic Heart Failure  History:        Patient has prior history of Echocardiogram examinations. CHF;                 Signs/Symptoms:Edema.  Sonographer:    Lucendia Herrlich Referring Phys: [id number] SARAH A MCCLUNG IMPRESSIONS  1. Left ventricular ejection fraction, by estimation, is 55 to 60%. The left ventricle has normal function. The left ventricle has no regional wall motion abnormalities. Left ventricular diastolic parameters are consistent with Grade I diastolic dysfunction (impaired relaxation).  2. Right ventricular systolic function is normal. The right ventricular size is mildly enlarged.  3. The mitral valve is grossly normal. Trivial mitral valve regurgitation. No evidence of mitral stenosis.  4. The aortic valve is tricuspid. Aortic valve regurgitation is not visualized. Aortic valve sclerosis/calcification is present, without any evidence of aortic stenosis.  5. Aortic dilatation noted. There is mild dilatation of the ascending aorta, measuring 40 mm.  6. The inferior vena cava is normal in size with greater than 50% respiratory variability, suggesting right atrial pressure of 3 mmHg. Comparison(s): No significant change from prior study. FINDINGS  Left Ventricle: Left ventricular ejection fraction, by estimation, is 55 to 60%. The left ventricle  has normal function. The left ventricle has no regional wall motion abnormalities. The left ventricular internal cavity size was normal in size. There is  no left ventricular hypertrophy. Left ventricular  diastolic parameters are consistent with Grade I diastolic dysfunction (impaired relaxation). Right Ventricle: The right ventricular size is mildly enlarged. Right vetricular wall thickness was not well visualized. Right ventricular systolic function is normal. Left Atrium: Left atrial size was normal in size. Right Atrium: Right atrial size was normal in size. Pericardium: There is no evidence of pericardial effusion. Mitral Valve: The mitral valve is grossly normal. Trivial mitral valve regurgitation. No evidence of mitral valve stenosis. Tricuspid Valve: The tricuspid valve is normal in structure. Tricuspid valve regurgitation is not demonstrated. Aortic Valve: The aortic valve is tricuspid. Aortic valve regurgitation is not visualized. Aortic valve sclerosis/calcification is present, without any evidence of aortic stenosis. Aortic valve peak gradient measures 8.8 mmHg. Pulmonic Valve: The pulmonic valve was not well visualized. Pulmonic valve regurgitation is trivial. Aorta: Aortic dilatation noted. There is mild dilatation of the ascending aorta, measuring 40 mm. Venous: The inferior vena cava is normal in size with greater than 50% respiratory variability, suggesting right atrial pressure of 3 mmHg. IAS/Shunts: The atrial septum is grossly normal.  LEFT VENTRICLE PLAX 2D LVIDd:         6.00 cm   Diastology LVIDs:         4.20 cm   LV e' medial:    8.08 cm/s LV PW:         1.10 cm   LV E/e' medial:  7.8 LV IVS:        0.90 cm   LV e' lateral:   8.86 cm/s LVOT diam:     2.50 cm   LV E/e' lateral: 7.1 LV SV:         79 LV SV Index:   30 LVOT Area:     4.91 cm  RIGHT VENTRICLE             IVC RV S prime:     19.10 cm/s  IVC diam: 1.90 cm TAPSE (M-mode): 2.8 cm LEFT ATRIUM           Index        RIGHT ATRIUM           Index LA diam:      3.60 cm 1.36 cm/m   RA Area:     19.40 cm LA Vol (A2C): 52.0 ml 19.64 ml/m  RA Volume:   54.60 ml  20.62 ml/m LA Vol (A4C): 47.9 ml 18.09 ml/m  AORTIC VALVE AV Area (Vmax):  3.44 cm AV Vmax:        148.00 cm/s AV Peak Grad:   8.8 mmHg LVOT Vmax:      103.57 cm/s LVOT Vmean:     64.167 cm/s LVOT VTI:       0.161 m  AORTA Ao Root diam: 3.90 cm Ao Asc diam:  4.00 cm MITRAL VALVE MV Area (PHT): 4.21 cm    SHUNTS MV Decel Time: 180 msec    Systemic VTI:  0.16 m MR Peak grad: 6.5 mmHg     Systemic Diam: 2.50 cm MR Vmax:      127.00 cm/s MV E velocity: 62.90 cm/s MV A velocity: 88.60 cm/s MV E/A ratio:  0.71 Laurance Flatten MD Electronically signed by Laurance Flatten MD Signature Date/Time: 01/20/2023/4:23:16 PM    Final    DG Knee Left Port  Result Date: 01/20/2023  CLINICAL DATA:  Fracture, postop ORIF. EXAM: PORTABLE LEFT KNEE - 1-2 VIEW COMPARISON:  Preoperative imaging FINDINGS: Lateral plate and multi screw fixation of comminuted distal femur fracture. Improved fracture alignment from preoperative imaging. Remote proximal fibular fracture is unchanged. Recent postsurgical change includes air and edema in the soft tissues. IMPRESSION: ORIF of comminuted distal femur fracture with improved fracture alignment from preoperative imaging. Electronically Signed   By: Narda Rutherford M.D.   On: 01/20/2023 12:56   DG FEMUR MIN 2 VIEWS LEFT  Result Date: 01/20/2023 CLINICAL DATA:  Left femur ORIF.  Intraoperative fluoroscopy. EXAM: LEFT FEMUR 2 VIEWS COMPARISON:  Left hip and left knee radiographs 01/19/2023 FINDINGS: Images were performed intraoperatively without the presence of a radiologist. The patient is undergoing lateral plate and screw fixation of the previously seen markedly comminuted and moderately displaced distal femoral metaphyseal fracture. Total fluoroscopy images: 6 Total fluoroscopy time: 55 seconds Total dose: Radiation Exposure Index (as provided by the fluoroscopic device): 4.99 mGy air Kerma Please see intraoperative findings for further detail. IMPRESSION: Intraoperative fluoroscopy for ORIF of the distal femoral fracture. Electronically Signed   By: Neita Garnet M.D.   On: 01/20/2023 09:06   DG C-Arm 1-60 Min-No Report  Result Date: 01/20/2023 Fluoroscopy was utilized by the requesting physician.  No radiographic interpretation.   CT Knee Left Wo Contrast  Result Date: 01/19/2023 CLINICAL DATA:  Left distal femur fracture after fall. EXAM: CT OF THE LEFT KNEE WITHOUT CONTRAST TECHNIQUE: Multidetector CT imaging of the left knee was performed according to the standard protocol. Multiplanar CT image reconstructions were also generated. RADIATION DOSE REDUCTION: This exam was performed according to the departmental dose-optimization program which includes automated exposure control, adjustment of the mA and/or kV according to patient size and/or use of iterative reconstruction technique. COMPARISON:  Left knee x-rays from same day. FINDINGS: Bones/Joint/Cartilage Acute comminuted fracture of the distal femoral metaphysis with 1.2 cm posterior displacement 3 cm of overriding. No additional acute fracture. No dislocation. Old healed fracture of the proximal fibula. Moderate medial and mild-to-moderate patellofemoral joint space narrowing. Tricompartmental marginal osteophytes. Trace joint effusion. Ligaments Ligaments are suboptimally evaluated by CT. Muscles and Tendons Grossly intact. Soft tissue No fluid collection or hematoma.  No soft tissue mass. IMPRESSION: 1. Acute comminuted impacted fracture of the distal femoral metaphysis as described above. 2. Old healed fracture of the proximal fibula. 3. Tricompartmental osteoarthritis. Electronically Signed   By: Obie Dredge M.D.   On: 01/19/2023 09:39   DG Chest Portable 1 View  Result Date: 01/19/2023 CLINICAL DATA:  Shortness of breath. EXAM: PORTABLE CHEST 1 VIEW COMPARISON:  10/17/2021 FINDINGS: Again noted are enlarged central vascular structures with slightly enlarged interstitial lung markings. Findings are concerning for pulmonary edema. Heart size is upper limits of normal and stable. Trachea is  midline. Negative for a pneumothorax. No acute bone abnormality. IMPRESSION: Prominent central vascular structures with enlarged lung markings. Findings are concerning for pulmonary edema. Electronically Signed   By: Richarda Overlie M.D.   On: 01/19/2023 08:26   DG Knee Complete 4 Views Left  Result Date: 01/19/2023 CLINICAL DATA:  Knee pain.  Patient fell in the shower this morning. EXAM: LEFT KNEE - COMPLETE 4+ VIEW COMPARISON:  None Available. FINDINGS: Acute comminuted fracture through the distal femoral metaphysis. There is associated soft tissue swelling and probable knee hemarthrosis. Healed fracture of the proximal fibula is noted. Mild tricompartmental degenerative changes are also present. IMPRESSION: Comminuted fracture through the distal femoral metaphysis  with associated soft tissue swelling and hemarthrosis. Remote healed proximal fibular fracture. Electronically Signed   By: Malachy Moan M.D.   On: 01/19/2023 06:38   DG Hip Unilat W or Wo Pelvis 2-3 Views Left  Result Date: 01/19/2023 CLINICAL DATA:  Fall, severe knee and hip pain EXAM: DG HIP (WITH OR WITHOUT PELVIS) 2-3V LEFT COMPARISON:  Concurrently obtained radiographs of the knee FINDINGS: No acute fracture or malalignment. 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 IMPRESSION: 1. No acute fracture or malalignment. 2. Mild bilateral hip joint degenerative osteoarthritis. Electronically Signed   By: Malachy Moan M.D.   On: 01/19/2023 06:36    Microbiology: Results for orders placed or performed during the hospital encounter of 01/19/23  Surgical PCR screen     Status: None   Collection Time: 01/19/23  4:22 PM   Specimen: Nasal Mucosa; Nasal Swab  Result Value Ref Range Status   MRSA, PCR NEGATIVE NEGATIVE Final   Staphylococcus aureus NEGATIVE NEGATIVE Final    Comment: (NOTE) The Xpert  SA Assay (FDA approved for NASAL specimens in patients 24 years of age and older), is one component of a comprehensive surveillance program. It is not intended to diagnose infection nor to guide or monitor treatment. Performed at [facility], [address], [place], [place] [address]     Labs: CBC: Recent Labs  Lab 01/19/23 0754 01/20/23 0130 01/21/23 0307 01/22/23 0107 01/23/23 0136 01/24/23 0920 01/25/23 0150  WBC 19.9*   < > 11.9* 9.6 8.2 7.3 7.9  NEUTROABS 17.2*  --  9.2*  --  5.2 4.9 5.2  HGB 13.4   < > 10.9* 10.2* 9.8* 10.1* 10.1*  HCT 44.2   < > 35.0* 33.8* 32.1* 33.3* 33.1*  MCV 85.7   < > 85.0 85.1 85.4 85.6 85.8  PLT 258   < > 209 203 210 226 227   < > = values in this interval not displayed.   Basic Metabolic Panel: Recent Labs  Lab 01/21/23 0307 01/22/23 0107 01/23/23 0136 01/24/23 0920 01/25/23 0150  NA 138 138 137 138 138  K 4.3 4.3 4.3 4.2 4.3  CL 102 102 102 102 100  CO2 28 28 29 30 31   GLUCOSE 128* 106* 106* 115* 118*  BUN 49* 47* 41* 33* 32*  CREATININE 1.89* 1.83* 1.66* 1.45* 1.50*  CALCIUM 8.2* 8.2* 8.2* 8.4* 8.4*  MG 2.2 2.1 2.0 2.0 1.9  PHOS 3.5 3.6 3.4 2.5 3.3   Liver Function Tests: Recent Labs  Lab 01/21/23 0307 01/22/23 0107 01/23/23 0136 01/24/23 0920 01/25/23 0150  AST 12* 13* 9* 13* 12*  ALT 8 5 6  <5 8  ALKPHOS 44 38 38 37* 37*  BILITOT 0.6 0.5 0.8 0.8 1.1  PROT 5.4* 5.4* 5.4* 5.3* 5.1*  ALBUMIN 2.8* 2.8* 2.6* 2.6* 2.5*   CBG: No results for input(s): "GLUCAP" in the last 168 hours.  Discharge time spent: greater than 30 minutes.  Signed: Marguerita Merles, DO [facility] 01/25/2023

## 2023-01-25 NOTE — TOC Transition Note (Addendum)
Transition of Care [facility]) - CM/SW Discharge Note   Patient Details  Name: Christopher [name] MRN: [id number] Date of Birth: 08-17-1951  Transition of [facility]) CM/SW Contact:  Lorri Frederick, LCSW Phone Number: 01/25/2023, 12:37 PM   Clinical Narrative:   Pt discharging to [facility].  RN call report to [phone].  1000: CSW confirmed with Tanya/[place] that she can receive pt today.    1430: [place] question regarding CPAP--per daughter Silva Bandy, pt has cpap at home, has not been using,  asks daughter to bring it to SNF and daughter agrees.   Final next level of care: Skilled Nursing Facility Barriers to Discharge: Barriers Resolved   Patient Goals and CMS Choice CMS Medicare.gov Compare Post Acute Care list provided to:: Patient Choice offered to / list presented to : Patient  Discharge Placement                Patient chooses bed at: [facility] Patient to be transferred to facility by: [facility] Name of family member notified: Left message with daughter Silva Bandy  1300: TC spoke with [name] directly Patient and family notified of of transfer: 01/25/23  Discharge Plan and Services Additional resources added to the After Visit Summary for   In-house Referral: Clinical Social Work   Post Acute Care Choice: Skilled Nursing Facility                               Social Determinants of Health (SDOH) Interventions SDOH Screenings   Food Insecurity: No Food Insecurity (01/19/2023)  Housing: Low Risk  (01/19/2023)  Transportation Needs: No Transportation Needs (01/19/2023)  Utilities: Not At Risk (01/19/2023)  Depression (PHQ2-9): Low Risk  (10/20/2022)  Tobacco Use: Low Risk  (01/21/2023)     Readmission Risk Interventions    04/26/2021   10:16 AM  Readmission Risk Prevention Plan  Transportation Screening Complete  PCP or Specialist Appt within 3-5 Days Complete  HRI or Home Care Consult Complete  Social Work Consult for Recovery Care  Planning/Counseling Complete  Palliative Care Screening Not Applicable  Medication Review [employer]) Complete

## 2023-01-26 ENCOUNTER — Ambulatory Visit: Payer: Medicare Other

## 2023-05-11 ENCOUNTER — Ambulatory Visit: Payer: Medicare Other

## 2023-05-16 ENCOUNTER — Ambulatory Visit: Payer: Medicare Other | Admitting: Physical Therapy

## 2023-05-16 ENCOUNTER — Telehealth: Payer: Self-pay | Admitting: Physical Therapy

## 2023-05-16 NOTE — Telephone Encounter (Signed)
LVM for patient to call me back. Asked him if he has another referral from his doctor in his hand or do we need to sent over a referral. Left our phone number.

## 2023-05-17 ENCOUNTER — Telehealth: Payer: Self-pay | Admitting: Physical Therapy

## 2023-05-17 NOTE — Telephone Encounter (Signed)
LVM for [name] to call us back. I let her know I sent a referral to his pcp for a signature but we still do not have the signed order for him to return tomorrow.

## 2023-05-18 ENCOUNTER — Ambulatory Visit: Payer: Medicare Other | Admitting: Physical Therapy

## 2023-05-18 NOTE — Therapy (Deleted)
OUTPATIENT PHYSICAL THERAPY NEURO EVALUATION   Patient Name: Christopher [name] MRN: [id number] DOB:Mar 02, 1951, 72 y.o., male Today's Date: 05/18/2023   PCP: Danella Penton, MD  REFERRING PROVIDER: Danella Penton, MD   END OF SESSION:   Past Medical History:  Diagnosis Date   Anxiety    CHF (congestive heart failure) (HCC)    COPD (chronic obstructive pulmonary disease) (HCC)    Depression    Dysrhythmia    Hyperlipidemia    Hypertension    Hypothyroidism    Sleep apnea    Past Surgical History:  Procedure Laterality Date   FRACTURE SURGERY     ORIF ANKLE FRACTURE Left 12/17/2020   Procedure: OPEN REDUCTION INTERNAL FIXATION (ORIF) ANKLE FRACTURE;  Surgeon: Gwyneth Revels, DPM;  Location: [facility] ORS;  Service: Podiatry;  Laterality: Left;   ORIF FEMUR FRACTURE Left 01/20/2023   Procedure: OPEN REDUCTION INTERNAL FIXATION (ORIF) DISTAL FEMUR FRACTURE;  Surgeon: Roby Lofts, MD;  Location: [facility] OR;  Service: Orthopedics;  Laterality: Left;   Patient Active Problem List   Diagnosis Date Noted   Closed left subtrochanteric femur fracture, initial encounter (HCC) 01/19/2023   CKD (chronic kidney disease) stage 4, GFR 15-29 ml/min (HCC) 01/26/2022   Generalized weakness 10/16/2021   Chronic respiratory failure with hypoxia (HCC) 10/16/2021   Hypotension    SIRS (systemic inflammatory response syndrome) (HCC)    AF (paroxysmal atrial fibrillation) (HCC)    Syncope    Severe sepsis (HCC) 05/20/2021   Pressure injury of skin 04/25/2021   Acute respiratory failure (HCC) 04/21/2021   Acute kidney injury superimposed on CKD (HCC) 04/21/2021   Stage 3b chronic kidney disease (CKD) (HCC) 03/22/2021   Leukocytosis 03/22/2021   Osteomyelitis of ankle or foot, acute, left (HCC) 03/17/2021   Acute on chronic respiratory failure (HCC) 03/17/2021   Osteomyelitis (HCC) 12/16/2020   Depression 12/16/2020   Medicare annual wellness visit, initial 11/14/2019   B12 deficiency 11/14/2019    Hyperlipidemia 10/31/2019   Primary osteoarthritis of right knee 03/16/2019   Sleep apnea 10/31/2018   Traumatic incomplete tear of left rotator cuff 10/02/2018   Morbid obesity with BMI of 45.0-49.9, adult (HCC) 09/20/2018   Hypothyroidism 09/20/2018   Chronic diastolic CHF (congestive heart failure) (HCC) 09/20/2018   Benign essential hypertension 09/20/2018    ONSET DATE: ***  REFERRING DIAG: ***  THERAPY DIAG:  Difficulty in walking, not elsewhere classified  Muscle weakness (generalized)  Unsteadiness on feet  Abnormality of gait and mobility  Other abnormalities of gait and mobility  Rationale for Evaluation and Treatment: {HABREHAB:27488}  SUBJECTIVE:  SUBJECTIVE STATEMENT: *** Pt accompanied by: {accompnied:27141}  PERTINENT HISTORY: ***  PAIN:  Are you having pain? {OPRCPAIN:27236}  PRECAUTIONS: {Therapy precautions:24002}  RED FLAGS: {PT Red Flags:29287}   WEIGHT BEARING RESTRICTIONS: {Yes ***/No:24003}  FALLS: Has patient fallen in last 6 months? {fallsyesno:27318}  LIVING ENVIRONMENT: Lives with: {OPRC lives with:25569::"lives with their family"} Lives in: {Lives in:25570} Stairs: {opstairs:27293} Has following equipment at home: {Assistive devices:23999}  PLOF: {PLOF:24004}  PATIENT GOALS: ***  OBJECTIVE:   DIAGNOSTIC FINDINGS: ***  COGNITION: Overall cognitive status: {cognition:24006}   SENSATION: {sensation:27233}  COORDINATION: ***  EDEMA:  {edema:24020}  MUSCLE TONE: {LE tone:25568}  MUSCLE LENGTH: Hamstrings: Right *** deg; Left *** deg Thomas test: Right *** deg; Left *** deg  DTRs:  {DTR SITE:24025}  POSTURE: {posture:25561}  LOWER EXTREMITY ROM:     {AROM/PROM:27142}  Right Eval Left Eval  Hip flexion    Hip extension     Hip abduction    Hip adduction    Hip internal rotation    Hip external rotation    Knee flexion    Knee extension    Ankle dorsiflexion    Ankle plantarflexion    Ankle inversion    Ankle eversion     (Blank rows = not tested)  LOWER EXTREMITY MMT:    MMT Right Eval Left Eval  Hip flexion    Hip extension    Hip abduction    Hip adduction    Hip internal rotation    Hip external rotation    Knee flexion    Knee extension    Ankle dorsiflexion    Ankle plantarflexion    Ankle inversion    Ankle eversion    (Blank rows = not tested)  BED MOBILITY:  {Bed mobility:24027}  TRANSFERS: Assistive device utilized: {Assistive devices:23999}  Sit to stand: {Levels of assistance:24026} Stand to sit: {Levels of assistance:24026} Chair to chair: {Levels of assistance:24026} Floor: {Levels of assistance:24026}  RAMP:  Level of Assistance: {Levels of assistance:24026} Assistive device utilized: {Assistive devices:23999} Ramp Comments: ***  CURB:  Level of Assistance: {Levels of assistance:24026} Assistive device utilized: {Assistive devices:23999} Curb Comments: ***  STAIRS: Level of Assistance: {Levels of assistance:24026} Stair Negotiation Technique: {Stair Technique:27161} with {Rail Assistance:27162} Number of Stairs: ***  Height of Stairs: ***  Comments: ***  GAIT: Gait pattern: {gait characteristics:25376} Distance walked: *** Assistive device utilized: {Assistive devices:23999} Level of assistance: {Levels of assistance:24026} Comments: ***  FUNCTIONAL TESTS:  5 times sit to stand: *** Timed up and go (TUG): *** 10 meter walk test: ***  PATIENT SURVEYS:  FOTO ***  TODAY'S TREATMENT:                                                                                                                              Eval only    PATIENT EDUCATION: Education details: POC Person educated: Patient Education method: Explanation Education comprehension:  verbalized understanding   HOME EXERCISE PROGRAM: Establish visit 2  GOALS: Goals reviewed with patient? {yes/no:20286}  SHORT TERM GOALS: Target date: ***     Patient will be independent in home exercise program to improve strength/mobility for better functional independence with ADLs. Baseline: No HEP currently  Goal status: INITIAL   LONG TERM GOALS: Target date: ***  ***.  Patient (> 51 years old) will complete five times sit to stand test in < 15 seconds indicating an increased LE strength and improved balance. Baseline: *** Goal status: INITIAL  ***.  Patient will increase FOTO score to equal to or greater than  ***   to demonstrate statistically significant improvement in mobility and quality of life.  Baseline: *** Goal status: INITIAL   ***.  Patient will increase Berg Balance score by > 6 points to demonstrate decreased fall risk during functional activities. Baseline: *** Goal status: INITIAL   ***.   Patient will reduce timed up and go to <11 seconds to reduce fall risk and demonstrate improved transfer/gait ability. Baseline: *** Goal status: INITIAL  ***.   Patient will increase 10 meter walk test to >1.48m/s as to improve gait speed for better community ambulation and to reduce fall risk. Baseline: *** Goal status: INITIAL  ***.   Patient will increase six minute walk test distance to >1000 for progression to community ambulator and improve gait ability Baseline: *** Goal status: INITIAL    ASSESSMENT:  CLINICAL IMPRESSION: Patient is a *** y.o. *** who was seen today for physical therapy evaluation and treatment for ***.   OBJECTIVE IMPAIRMENTS: {opptimpairments:25111}.   ACTIVITY LIMITATIONS: {activitylimitations:27494}  PARTICIPATION LIMITATIONS: {participationrestrictions:25113}  PERSONAL FACTORS: {Personal factors:25162} are also affecting patient's functional outcome.   REHAB POTENTIAL: {rehabpotential:25112}  CLINICAL DECISION  MAKING: {clinical decision making:25114}  EVALUATION COMPLEXITY: {Evaluation complexity:25115}  PLAN:  PT FREQUENCY: {rehab frequency:25116}  PT DURATION: {rehab duration:25117}  PLANNED INTERVENTIONS: {rehab planned interventions:25118::"Therapeutic exercises","Therapeutic activity","Neuromuscular re-education","Balance training","Gait training","Patient/Family education","Self Care","Joint mobilization"}  PLAN FOR NEXT SESSION: ***   Norman Herrlich, PT 05/18/2023, 9:13 AM

## 2023-05-23 ENCOUNTER — Encounter: Payer: Self-pay | Admitting: Physical Therapy

## 2023-05-23 ENCOUNTER — Ambulatory Visit: Payer: Medicare Other | Attending: Student | Admitting: Physical Therapy

## 2023-05-23 DIAGNOSIS — R2689 Other abnormalities of gait and mobility: Secondary | ICD-10-CM | POA: Diagnosis present

## 2023-05-23 DIAGNOSIS — R269 Unspecified abnormalities of gait and mobility: Secondary | ICD-10-CM | POA: Diagnosis present

## 2023-05-23 DIAGNOSIS — R262 Difficulty in walking, not elsewhere classified: Secondary | ICD-10-CM | POA: Insufficient documentation

## 2023-05-23 DIAGNOSIS — M6281 Muscle weakness (generalized): Secondary | ICD-10-CM | POA: Diagnosis present

## 2023-05-23 DIAGNOSIS — R2681 Unsteadiness on feet: Secondary | ICD-10-CM | POA: Diagnosis present

## 2023-05-23 NOTE — Therapy (Signed)
OUTPATIENT PHYSICAL THERAPY NEURO EVALUATION   Patient Name: Christopher [name] MRN: [id number] DOB:1951-02-09, 72 y.o., male Today's Date: 05/23/2023   PCP: Danella Penton, MD  REFERRING PROVIDER: Roby Lofts, MD    END OF SESSION:  PT End of Session - 05/23/23 1258     Visit Number 1    Number of Visits 24    Date for PT Re-Evaluation 08/15/23    Authorization Type [employer] Medicare    Progress Note Due on Visit 10    PT Start Time 1021    PT Stop Time 1103    PT Time Calculation (min) 42 min    Equipment Utilized During Treatment Gait belt    Activity Tolerance Patient tolerated treatment well    Behavior During Therapy WFL for tasks assessed/performed             Past Medical History:  Diagnosis Date   Anxiety    CHF (congestive heart failure) (HCC)    COPD (chronic obstructive pulmonary disease) (HCC)    Depression    Dysrhythmia    Hyperlipidemia    Hypertension    Hypothyroidism    Sleep apnea    Past Surgical History:  Procedure Laterality Date   FRACTURE SURGERY     ORIF ANKLE FRACTURE Left 12/17/2020   Procedure: OPEN REDUCTION INTERNAL FIXATION (ORIF) ANKLE FRACTURE;  Surgeon: Gwyneth Revels, DPM;  Location: [facility] ORS;  Service: Podiatry;  Laterality: Left;   ORIF FEMUR FRACTURE Left 01/20/2023   Procedure: OPEN REDUCTION INTERNAL FIXATION (ORIF) DISTAL FEMUR FRACTURE;  Surgeon: Roby Lofts, MD;  Location: [facility] OR;  Service: Orthopedics;  Laterality: Left;   Patient Active Problem List   Diagnosis Date Noted   Closed left subtrochanteric femur fracture, initial encounter (HCC) 01/19/2023   CKD (chronic kidney disease) stage 4, GFR 15-29 ml/min (HCC) 01/26/2022   Generalized weakness 10/16/2021   Chronic respiratory failure with hypoxia (HCC) 10/16/2021   Hypotension    SIRS (systemic inflammatory response syndrome) (HCC)    AF (paroxysmal atrial fibrillation) (HCC)    Syncope    Severe sepsis (HCC) 05/20/2021   Pressure injury of skin 04/25/2021    Acute respiratory failure (HCC) 04/21/2021   Acute kidney injury superimposed on CKD (HCC) 04/21/2021   Stage 3b chronic kidney disease (CKD) (HCC) 03/22/2021   Leukocytosis 03/22/2021   Osteomyelitis of ankle or foot, acute, left (HCC) 03/17/2021   Acute on chronic respiratory failure (HCC) 03/17/2021   Osteomyelitis (HCC) 12/16/2020   Depression 12/16/2020   Medicare annual wellness visit, initial 11/14/2019   B12 deficiency 11/14/2019   Hyperlipidemia 10/31/2019   Primary osteoarthritis of right knee 03/16/2019   Sleep apnea 10/31/2018   Traumatic incomplete tear of left rotator cuff 10/02/2018   Morbid obesity with BMI of 45.0-49.9, adult (HCC) 09/20/2018   Hypothyroidism 09/20/2018   Chronic diastolic CHF (congestive heart failure) (HCC) 09/20/2018   Benign essential hypertension 09/20/2018    ONSET DATE: 01/19/23  REFERRING DIAG: N82.956O (ICD-10-CM) - Closed comminuted supracondylar fracture of left femur (HCC)   THERAPY DIAG:  Difficulty in walking, not elsewhere classified  Muscle weakness (generalized)  Unsteadiness on feet  Abnormality of gait and mobility  Other abnormalities of gait and mobility  Rationale for Evaluation and Treatment: Rehabilitation  SUBJECTIVE:  SUBJECTIVE STATEMENT: Pt is here for femoral fracture. When pt was stepping out of the shower his leg gave out and he fell onto the floor. Pt is working on getting the shower changed to make it a simple walk in shower. Pt  reports most of his falls are in the bathroom. Pt has a bar in the shower but is unable to have a bar on the shower itself due to the set up. Pt is generally inactive and does not walk much around the house, relies on the wheelchair for mobility. Pt wants to get out of the wheel chair for mobility,  at least a little bit. When in rehab over the summer he was walking about 20-25 ft at a time.  Pt accompanied by: family member  PERTINENT HISTORY: ORIF of left supracondylar distal femur on 01/20/23. Went to SNF on 01/25/23 for rehab.   PAIN:  Are you having pain? Yes: Pain location: left knee Aggravating factors: certain movements   PRECAUTIONS: Fall  RED FLAGS: None   WEIGHT BEARING RESTRICTIONS: No  FALLS: Has patient fallen in last 6 months? Yes. Number of falls 1  LIVING ENVIRONMENT: Lives with: lives with their family Lives in: House/apartment Stairs: Yes: Internal: 1 steps; none and External: 2 steps; can reach both Has following equipment at home: Dan Humphreys - 2 wheeled, Environmental consultant - 4 wheeled, Wheelchair (manual), shower chair, Grab bars, and grabber to pick things up  PLOF: Requires assistive device for independence, Needs assistance with ADLs, Needs assistance with homemaking, Needs assistance with gait, and Needs assistance with transfers  PATIENT GOALS: Improve his mobility.   OBJECTIVE:   DIAGNOSTIC FINDINGS:from 01/20/23:  " IMPRESSION: ORIF of comminuted distal femur fracture with improved fracture alignment from preoperative imaging"  COGNITION: Overall cognitive status: Within functional limits for tasks assessed   SENSATION: Not tested  COORDINATION: Not tested   EDEMA:  Not measured but B LE swelling seemed to be likely based on pt presentation    LOWER EXTREMITY ROM:     Active  Right Eval Left Eval  Hip flexion    Hip extension    Hip abduction    Hip adduction    Hip internal rotation    Hip external rotation    Knee flexion  95*  Knee extension Unable to achieve full extension in seated  Unable to achieve full extension in seated   Ankle dorsiflexion    Ankle plantarflexion    Ankle inversion    Ankle eversion     (Blank rows = not tested) *indicated measured in seated in WC and could not accurately ID bony landmarks.   LOWER EXTREMITY  MMT:    MMT Right Eval Left Eval  Hip flexion 4- 3+  Hip extension    Hip abduction  4-*  Hip adduction  4-  Hip internal rotation 4 3+  Hip external rotation 4 3  Knee flexion 4 4-  Knee extension 4 4-  Ankle dorsiflexion    Ankle plantarflexion    Ankle inversion    Ankle eversion    (Blank rows = not tested)    TRANSFERS: Assistive device utilized: Environmental consultant - 2 wheeled  Sit to stand: Min A and DTR holds walker, pt uses heavy UE assist  Stand to sit: CGA Chair to chair: CGA and uses walker, close guarding Floor:  unable  RAMP:   Ramp Comments: uses manual WC propulsion  CURB:   Curb Comments: uses manual WC propulsion  STAIRS:   Comments: not  tested at eval, recomment testing at another time.   GAIT: Gait pattern:  left LE in hip ER throughout to make up for lack of ankle motion and strength. Heavy UE use on bari 2WW  Distance walked: 20 ft  Assistive device utilized: Environmental consultant - 2 wheeled and bariatric walker  Level of assistance: CGA and WC follow  Comments: decreased step length  FUNCTIONAL TESTS:  5 times sit to stand: 29 sec with heavy UE assist and walker Timed up and go (TUG): 88 sec walker 10 meter walk test: unable  PATIENT SURVEYS:  FOTO 28 RAG:49  TODAY'S TREATMENT:                                                                                                                              Eval only   PATIENT EDUCATION: Education details: POC Person educated: Patient Education method: Explanation Education comprehension: verbalized understanding   HOME EXERCISE PROGRAM: Establish visit 2    GOALS: Goals reviewed with patient? Yes  SHORT TERM GOALS: Target date: 06/20/2023    Patient will be independent in home exercise program to improve strength/mobility for better functional independence with ADLs. Baseline: No HEP currently  Goal status: INITIAL   LONG TERM GOALS: Target date: 08/15/2023   1.  Patient (> 14 years old) will  complete five times sit to stand test in <20 seconds and with UE support only on chair ( can use walker in standing for balance but cannot pull on walker) indicating an increased LE strength and improved balance. Baseline: 29 sec with heavy UE assist and walker Goal status: INITIAL  2.  Patient will increase FOTO score to equal to or greater than  49   to demonstrate statistically significant improvement in mobility and quality of life.  Baseline: 28 Goal status: INITIAL    3.   Patient will reduce timed up and go to <11 seconds to reduce fall risk and demonstrate improved transfer/gait ability. Baseline: 88 sec with walker and with Heavy UE use to come to standing position  Goal status: INITIAL  4.   Patient will increase 10 meter walk test to > .5 m/s as to improve gait speed for better community ambulation and to reduce fall risk. Baseline: Unable to ambulate this distance on eval  Goal status: INITIAL    ASSESSMENT:  CLINICAL IMPRESSION: Patient is a 72 y.o. M who was seen today for physical therapy evaluation and treatment for mobility deficits following distal femoral fracture and subsequent ORIF.  Patient presents with significant deficits in lower extremity strength and mobility.  Patient's deficits in mobility and strength pose increased risk for falls in the home posing increased risk for future falls potential fracture such as fracture is being treated for now.  Patient also has significant weakness in his left hip compared to his right hip and left knee compared to right knee and discomfort with many activities during testing.  Patient will benefit from  skilled physical therapy to improve his lower extremity strength, mobility, and quality of life.  OBJECTIVE IMPAIRMENTS: Abnormal gait, decreased activity tolerance, decreased balance, decreased endurance, decreased mobility, difficulty walking, decreased ROM, decreased strength, obesity, and pain.   ACTIVITY LIMITATIONS:  carrying, lifting, bending, standing, squatting, stairs, transfers, bed mobility, bathing, toileting, dressing, and locomotion level  PARTICIPATION LIMITATIONS: driving, shopping, community activity, and yard work  PERSONAL FACTORS: Age, Behavior pattern, Fitness, Past/current experiences, Time since onset of injury/illness/exacerbation, and 3+ comorbidities: CHF, COPD, HLD, HTN  are also affecting patient's functional outcome.   REHAB POTENTIAL: Fair previous poor compliance with HEP and with therapy visits.   CLINICAL DECISION MAKING: Stable/uncomplicated  EVALUATION COMPLEXITY: Low  PLAN:  PT FREQUENCY: 2x/week  PT DURATION: 12 weeks  PLANNED INTERVENTIONS: Therapeutic exercises, Therapeutic activity, Neuromuscular re-education, Balance training, Gait training, Patient/Family education, Self Care, Joint mobilization, Stair training, and Prosthetic training  PLAN FOR NEXT SESSION: seated therex for HEP, encourage independent completion of HEP to attenuate progress, begin POC   Norman Herrlich, PT 05/23/2023, 1:00 PM

## 2023-05-26 NOTE — Therapy (Deleted)
OUTPATIENT PHYSICAL THERAPY NEURO TREATMENT   Patient Name: Christopher [name] MRN: [id number] DOB:04/20/1951, 72 y.o., male Today's Date: 05/26/2023   PCP: Danella Penton, MD  REFERRING PROVIDER: Roby Lofts, MD    END OF SESSION:    Past Medical History:  Diagnosis Date   Anxiety    CHF (congestive heart failure) (HCC)    COPD (chronic obstructive pulmonary disease) (HCC)    Depression    Dysrhythmia    Hyperlipidemia    Hypertension    Hypothyroidism    Sleep apnea    Past Surgical History:  Procedure Laterality Date   FRACTURE SURGERY     ORIF ANKLE FRACTURE Left 12/17/2020   Procedure: OPEN REDUCTION INTERNAL FIXATION (ORIF) ANKLE FRACTURE;  Surgeon: Gwyneth Revels, DPM;  Location: [facility];  Service: Podiatry;  Laterality: Left;   ORIF FEMUR FRACTURE Left 01/20/2023   Procedure: OPEN REDUCTION INTERNAL FIXATION (ORIF) DISTAL FEMUR FRACTURE;  Surgeon: Roby Lofts, MD;  Location: [facility];  Service: Orthopedics;  Laterality: Left;   Patient Active Problem List   Diagnosis Date Noted   Closed left subtrochanteric femur fracture, initial encounter (HCC) 01/19/2023   CKD (chronic kidney disease) stage 4, GFR 15-29 ml/min (HCC) 01/26/2022   Generalized weakness 10/16/2021   Chronic respiratory failure with hypoxia (HCC) 10/16/2021   Hypotension    SIRS (systemic inflammatory response syndrome) (HCC)    AF (paroxysmal atrial fibrillation) (HCC)    Syncope    Severe sepsis (HCC) 05/20/2021   Pressure injury of skin 04/25/2021   Acute respiratory failure (HCC) 04/21/2021   Acute kidney injury superimposed on CKD (HCC) 04/21/2021   Stage 3b chronic kidney disease (CKD) (HCC) 03/22/2021   Leukocytosis 03/22/2021   Osteomyelitis of ankle or foot, acute, left (HCC) 03/17/2021   Acute on chronic respiratory failure (HCC) 03/17/2021   Osteomyelitis (HCC) 12/16/2020   Depression 12/16/2020   Medicare annual wellness visit, initial 11/14/2019   B12 deficiency  11/14/2019   Hyperlipidemia 10/31/2019   Primary osteoarthritis of right knee 03/16/2019   Sleep apnea 10/31/2018   Traumatic incomplete tear of left rotator cuff 10/02/2018   Morbid obesity with BMI of 45.0-49.9, adult (HCC) 09/20/2018   Hypothyroidism 09/20/2018   Chronic diastolic CHF (congestive heart failure) (HCC) 09/20/2018   Benign essential hypertension 09/20/2018    ONSET DATE: 01/19/23  REFERRING DIAG: N82.956O (ICD-10-CM) - Closed comminuted supracondylar fracture of left femur (HCC)   THERAPY DIAG:  No diagnosis found.  Rationale for Evaluation and Treatment: Rehabilitation  SUBJECTIVE:  SUBJECTIVE STATEMENT: Pt is here for femoral fracture. When pt was stepping out of the shower his leg gave out and he fell onto the floor. Pt is working on getting the shower changed to make it a simple walk in shower. Pt  reports most of his falls are in the bathroom. Pt has a bar in the shower but is unable to have a bar on the shower itself due to the set up. Pt is generally inactive and does not walk much around the house, relies on the wheelchair for mobility. Pt wants to get out of the wheel chair for mobility, at least a little bit. When in rehab over the summer he was walking about 20-25 ft at a time.  Pt accompanied by: family member  PERTINENT HISTORY: ORIF of left supracondylar distal femur on 01/20/23. Went to SNF on 01/25/23 for rehab. Pt is here for femoral fracture. When pt was stepping out of the shower his leg gave out and he fell onto the floor. Pt is working on getting the shower changed to make it a simple walk in shower. Pt  reports most of his falls are in the bathroom. Pt has a bar in the shower but is unable to have a bar on the shower itself due to the set up. Pt is generally inactive and  does not walk much around the house, relies on the wheelchair for mobility. Pt wants to get out of the wheel chair for mobility, at least a little bit. When in rehab over the summer he was walking about 20-25 ft at a time.    PAIN:  Are you having pain? Yes: Pain location: left knee Aggravating factors: certain movements   PRECAUTIONS: Fall  RED FLAGS: None   WEIGHT BEARING RESTRICTIONS: No  FALLS: Has patient fallen in last 6 months? Yes. Number of falls 1  LIVING ENVIRONMENT: Lives with: lives with their family Lives in: House/apartment Stairs: Yes: Internal: 1 steps; none and External: 2 steps; can reach both Has following equipment at home: Dan Humphreys - 2 wheeled, Environmental consultant - 4 wheeled, Wheelchair (manual), shower chair, Grab bars, and grabber to pick things up  PLOF: Requires assistive device for independence, Needs assistance with ADLs, Needs assistance with homemaking, Needs assistance with gait, and Needs assistance with transfers  PATIENT GOALS: Improve his mobility.   OBJECTIVE:   DIAGNOSTIC FINDINGS:from 01/20/23:  " IMPRESSION: ORIF of comminuted distal femur fracture with improved fracture alignment from preoperative imaging"  COGNITION: Overall cognitive status: Within functional limits for tasks assessed   SENSATION: Not tested  COORDINATION: Not tested   EDEMA:  Not measured but B LE swelling seemed to be likely based on pt presentation    LOWER EXTREMITY ROM:     Active  Right Eval Left Eval  Hip flexion    Hip extension    Hip abduction    Hip adduction    Hip internal rotation    Hip external rotation    Knee flexion  95*  Knee extension Unable to achieve full extension in seated  Unable to achieve full extension in seated   Ankle dorsiflexion    Ankle plantarflexion    Ankle inversion    Ankle eversion     (Blank rows = not tested) *indicated measured in seated in WC and could not accurately ID bony landmarks.   LOWER EXTREMITY MMT:     MMT Right Eval Left Eval  Hip flexion 4- 3+  Hip extension    Hip abduction  4-*  Hip adduction  4-  Hip internal rotation 4 3+  Hip external rotation 4 3  Knee flexion 4 4-  Knee extension 4 4-  Ankle dorsiflexion    Ankle plantarflexion    Ankle inversion    Ankle eversion    (Blank rows = not tested)    TRANSFERS: Assistive device utilized: Environmental consultant - 2 wheeled  Sit to stand: Min A and DTR holds walker, pt uses heavy UE assist  Stand to sit: CGA Chair to chair: CGA and uses walker, close guarding Floor:  unable  RAMP:   Ramp Comments: uses manual WC propulsion  CURB:   Curb Comments: uses manual WC propulsion  STAIRS:   Comments: not tested at eval, recomment testing at another time.   GAIT: Gait pattern:  left LE in hip ER throughout to make up for lack of ankle motion and strength. Heavy UE use on bari 2WW  Distance walked: 20 ft  Assistive device utilized: Environmental consultant - 2 wheeled and bariatric walker  Level of assistance: CGA and WC follow  Comments: decreased step length  FUNCTIONAL TESTS:  5 times sit to stand: 29 sec with heavy UE assist and walker Timed up and go (TUG): 88 sec walker 10 meter walk test: unable  PATIENT SURVEYS:  FOTO 28 RAG:49  TODAY'S TREATMENT:                                                                                                                              Eval only   PATIENT EDUCATION: Education details: POC Person educated: Patient Education method: Explanation Education comprehension: verbalized understanding   HOME EXERCISE PROGRAM: Establish visit 2    GOALS: Goals reviewed with patient? Yes  SHORT TERM GOALS: Target date: 06/20/2023    Patient will be independent in home exercise program to improve strength/mobility for better functional independence with ADLs. Baseline: No HEP currently  Goal status: INITIAL   LONG TERM GOALS: Target date: 08/15/2023   1.  Patient (> 76 years old) will  complete five times sit to stand test in <20 seconds and with UE support only on chair ( can use walker in standing for balance but cannot pull on walker) indicating an increased LE strength and improved balance. Baseline: 29 sec with heavy UE assist and walker Goal status: INITIAL  2.  Patient will increase FOTO score to equal to or greater than  49   to demonstrate statistically significant improvement in mobility and quality of life.  Baseline: 28 Goal status: INITIAL    3.   Patient will reduce timed up and go to <11 seconds to reduce fall risk and demonstrate improved transfer/gait ability. Baseline: 88 sec with walker and with Heavy UE use to come to standing position  Goal status: INITIAL  4.   Patient will increase 10 meter walk test to > .5 m/s as to improve gait speed for better community ambulation and to reduce fall  risk. Baseline: Unable to ambulate this distance on eval  Goal status: INITIAL    ASSESSMENT:  CLINICAL IMPRESSION: Patient is a 72 y.o. M who was seen today for physical therapy evaluation and treatment for mobility deficits following distal femoral fracture and subsequent ORIF.  Patient presents with significant deficits in lower extremity strength and mobility.  Patient's deficits in mobility and strength pose increased risk for falls in the home posing increased risk for future falls potential fracture such as fracture is being treated for now.  Patient also has significant weakness in his left hip compared to his right hip and left knee compared to right knee and discomfort with many activities during testing.  Patient will benefit from skilled physical therapy to improve his lower extremity strength, mobility, and quality of life.  OBJECTIVE IMPAIRMENTS: Abnormal gait, decreased activity tolerance, decreased balance, decreased endurance, decreased mobility, difficulty walking, decreased ROM, decreased strength, obesity, and pain.   ACTIVITY LIMITATIONS:  carrying, lifting, bending, standing, squatting, stairs, transfers, bed mobility, bathing, toileting, dressing, and locomotion level  PARTICIPATION LIMITATIONS: driving, shopping, community activity, and yard work  PERSONAL FACTORS: Age, Behavior pattern, Fitness, Past/current experiences, Time since onset of injury/illness/exacerbation, and 3+ comorbidities: CHF, COPD, HLD, HTN  are also affecting patient's functional outcome.   REHAB POTENTIAL: Fair previous poor compliance with HEP and with therapy visits.   CLINICAL DECISION MAKING: Stable/uncomplicated  EVALUATION COMPLEXITY: Low  PLAN:  PT FREQUENCY: 2x/week  PT DURATION: 12 weeks  PLANNED INTERVENTIONS: Therapeutic exercises, Therapeutic activity, Neuromuscular re-education, Balance training, Gait training, Patient/Family education, Self Care, Joint mobilization, Stair training, and Prosthetic training  PLAN FOR NEXT SESSION: seated therex for HEP, encourage independent completion of HEP to attenuate progress, begin POC   Norman Herrlich, PT 05/26/2023, 4:42 PM

## 2023-05-27 ENCOUNTER — Ambulatory Visit: Payer: Medicare Other | Admitting: Physical Therapy

## 2023-05-30 ENCOUNTER — Ambulatory Visit: Payer: Medicare Other | Attending: Internal Medicine | Admitting: Physical Therapy

## 2023-05-30 DIAGNOSIS — R2681 Unsteadiness on feet: Secondary | ICD-10-CM | POA: Insufficient documentation

## 2023-05-30 DIAGNOSIS — R269 Unspecified abnormalities of gait and mobility: Secondary | ICD-10-CM | POA: Insufficient documentation

## 2023-05-30 DIAGNOSIS — R262 Difficulty in walking, not elsewhere classified: Secondary | ICD-10-CM | POA: Insufficient documentation

## 2023-05-30 DIAGNOSIS — M6281 Muscle weakness (generalized): Secondary | ICD-10-CM | POA: Diagnosis present

## 2023-05-30 DIAGNOSIS — R2689 Other abnormalities of gait and mobility: Secondary | ICD-10-CM | POA: Insufficient documentation

## 2023-05-30 NOTE — Therapy (Signed)
OUTPATIENT PHYSICAL THERAPY NEURO TREATMENT   Patient Name: Christopher [name] MRN: [id number] DOB:[birth year], 72 y.o., male Today's Date: 05/30/2023   PCP: Danella Penton, MD  REFERRING PROVIDER: Roby Lofts, MD    END OF SESSION:  PT End of Session - 05/30/23 1151     Visit Number 2    Number of Visits 24    Date for PT Re-Evaluation 08/15/23    Authorization Type [employer]    Progress Note Due on Visit 10    PT Start Time 1016    PT Stop Time 1055    PT Time Calculation (min) 39 min    Equipment Utilized During Treatment Gait belt    Activity Tolerance Patient tolerated treatment well    Behavior During Therapy WFL for tasks assessed/performed              Past Medical History:  Diagnosis Date   Anxiety    CHF (congestive heart failure) (HCC)    COPD (chronic obstructive pulmonary disease) (HCC)    Depression    Dysrhythmia    Hyperlipidemia    Hypertension    Hypothyroidism    Sleep apnea    Past Surgical History:  Procedure Laterality Date   FRACTURE SURGERY     ORIF ANKLE FRACTURE Left 12/17/2020   Procedure: OPEN REDUCTION INTERNAL FIXATION (ORIF) ANKLE FRACTURE;  Surgeon: Gwyneth Revels, DPM;  Location: [facility];  Service: Podiatry;  Laterality: Left;   ORIF FEMUR FRACTURE Left 01/20/2023   Procedure: OPEN REDUCTION INTERNAL FIXATION (ORIF) DISTAL FEMUR FRACTURE;  Surgeon: Roby Lofts, MD;  Location: [facility];  Service: Orthopedics;  Laterality: Left;   Patient Active Problem List   Diagnosis Date Noted   Closed left subtrochanteric femur fracture, initial encounter (HCC) 01/19/2023   CKD (chronic kidney disease) stage 4, GFR 15-29 ml/min (HCC) 01/26/2022   Generalized weakness 10/16/2021   Chronic respiratory failure with hypoxia (HCC) 10/16/2021   Hypotension    SIRS (systemic inflammatory response syndrome) (HCC)    AF (paroxysmal atrial fibrillation) (HCC)    Syncope    Severe sepsis (HCC) 05/20/2021   Pressure injury of skin  04/25/2021   Acute respiratory failure (HCC) 04/21/2021   Acute kidney injury superimposed on CKD (HCC) 04/21/2021   Stage 3b chronic kidney disease (CKD) (HCC) 03/22/2021   Leukocytosis 03/22/2021   Osteomyelitis of ankle or foot, acute, left (HCC) 03/17/2021   Acute on chronic respiratory failure (HCC) 03/17/2021   Osteomyelitis (HCC) 12/16/2020   Depression 12/16/2020   Medicare annual wellness visit, initial 11/14/2019   B12 deficiency 11/14/2019   Hyperlipidemia 10/31/2019   Primary osteoarthritis of right knee 03/16/2019   Sleep apnea 10/31/2018   Traumatic incomplete tear of left rotator cuff 10/02/2018   Morbid obesity with BMI of 45.0-49.9, adult (HCC) 09/20/2018   Hypothyroidism 09/20/2018   Chronic diastolic CHF (congestive heart failure) (HCC) 09/20/2018   Benign essential hypertension 09/20/2018    ONSET DATE: 01/19/23  REFERRING DIAG: D32.202R (ICD-10-CM) - Closed comminuted supracondylar fracture of left femur (HCC)   THERAPY DIAG:  Difficulty in walking, not elsewhere classified  Muscle weakness (generalized)  Unsteadiness on feet  Abnormality of gait and mobility  Other abnormalities of gait and mobility  Rationale for Evaluation and Treatment: Rehabilitation  SUBJECTIVE:  SUBJECTIVE STATEMENT: pt reports no changes since last session.   Pt accompanied by: family member  PERTINENT HISTORY: ORIF of left supracondylar distal femur on 01/20/23. Went to SNF on 01/25/23 for rehab. Pt is here for femoral fracture. When pt was stepping out of the shower his leg gave out and he fell onto the floor. Pt is working on getting the shower changed to make it a simple walk in shower. Pt  reports most of his falls are in the bathroom. Pt has a bar in the shower but is unable to have a bar on  the shower itself due to the set up. Pt is generally inactive and does not walk much around the house, relies on the wheelchair for mobility. Pt wants to get out of the wheel chair for mobility, at least a little bit. When in rehab over the summer he was walking about 20-25 ft at a time.    PAIN:  Are you having pain? Yes: Pain location: left knee Aggravating factors: certain movements   PRECAUTIONS: Fall  RED FLAGS: None   WEIGHT BEARING RESTRICTIONS: No  FALLS: Has patient fallen in last 6 months? Yes. Number of falls 1  LIVING ENVIRONMENT: Lives with: lives with their family Lives in: House/apartment Stairs: Yes: Internal: 1 steps; none and External: 2 steps; can reach both Has following equipment at home: Dan Humphreys - 2 wheeled, Environmental consultant - 4 wheeled, Wheelchair (manual), shower chair, Grab bars, and grabber to pick things up  PLOF: Requires assistive device for independence, Needs assistance with ADLs, Needs assistance with homemaking, Needs assistance with gait, and Needs assistance with transfers  PATIENT GOALS: Improve his mobility.   OBJECTIVE:   DIAGNOSTIC FINDINGS:from 01/20/23:  " IMPRESSION: ORIF of comminuted distal femur fracture with improved fracture alignment from preoperative imaging"  COGNITION: Overall cognitive status: Within functional limits for tasks assessed   SENSATION: Not tested  COORDINATION: Not tested   EDEMA:  Not measured but B LE swelling seemed to be likely based on pt presentation    LOWER EXTREMITY ROM:     Active  Right Eval Left Eval  Hip flexion    Hip extension    Hip abduction    Hip adduction    Hip internal rotation    Hip external rotation    Knee flexion  95*  Knee extension Unable to achieve full extension in seated  Unable to achieve full extension in seated   Ankle dorsiflexion    Ankle plantarflexion    Ankle inversion    Ankle eversion     (Blank rows = not tested) *indicated measured in seated in WC and could  not accurately ID bony landmarks.   LOWER EXTREMITY MMT:    MMT Right Eval Left Eval  Hip flexion 4- 3+  Hip extension    Hip abduction  4-*  Hip adduction  4-  Hip internal rotation 4 3+  Hip external rotation 4 3  Knee flexion 4 4-  Knee extension 4 4-  Ankle dorsiflexion    Ankle plantarflexion    Ankle inversion    Ankle eversion    (Blank rows = not tested)    TRANSFERS: Assistive device utilized: Environmental consultant - 2 wheeled  Sit to stand: Min A and DTR holds walker, pt uses heavy UE assist  Stand to sit: CGA Chair to chair: CGA and uses walker, close guarding Floor:  unable  RAMP:   Ramp Comments: uses manual WC propulsion  CURB:   Curb Comments: uses  manual WC propulsion  STAIRS:   Comments: not tested at eval, recomment testing at another time.   GAIT: Gait pattern:  left LE in hip ER throughout to make up for lack of ankle motion and strength. Heavy UE use on bari 2WW  Distance walked: 20 ft  Assistive device utilized: Environmental consultant - 2 wheeled and bariatric walker  Level of assistance: CGA and WC follow  Comments: decreased step length  FUNCTIONAL TESTS:  5 times sit to stand: 29 sec with heavy UE assist and walker Timed up and go (TUG): 88 sec walker 10 meter walk test: unable  PATIENT SURVEYS:  FOTO 28 RAG:49  TODAY'S TREATMENT:                                                                                                                              Therex   LAQ 2 x 10  Seated march 2 x 10  HS curls 2 x 10  Heel raises 2 x 10  Seated step over 2 x 10 ea LE ( hip abd/ IR then hip ADD / ER) Hip abduction 2 x 10 with GTB  Hip adduction 2 x 10 with bolster  STS from elevated surface 2 x 3 reps 24 in table height, UE assist on table but no pulling on walker ( uses walker for standing balance) - min knee pain noted on last set, transferred back to [facility] following using [place] RW   PATIENT EDUCATION: Education details: POC Person educated: Patient Education  method: Explanation Education comprehension: verbalized understanding   HOME EXERCISE PROGRAM: Access Code: [email or web address] URL: [email or web address] Date: 05/30/2023 Prepared by: Thresa Ross  Exercises - Seated Long Arc Quad  - 1 x daily - 7 x weekly - 2 sets - 10 reps - 3 sec  hold - Seated March  - 1 x daily - 7 x weekly - 2 sets - 10 reps - Seated single leg step to side   - 1 x daily - 7 x weekly - 2 sets - 10 reps - Seated Heel Raise  - 1 x daily - 7 x weekly - 2 sets - 15 reps   GOALS: Goals reviewed with patient? Yes  SHORT TERM GOALS: Target date: 06/20/2023    Patient will be independent in home exercise program to improve strength/mobility for better functional independence with ADLs. Baseline: No HEP currently  Goal status: INITIAL   LONG TERM GOALS: Target date: 08/15/2023   1.  Patient (> 49 years old) will complete five times sit to stand test in <20 seconds and with UE support only on chair ( can use walker in standing for balance but cannot pull on walker) indicating an increased LE strength and improved balance. Baseline: 29 sec with heavy UE assist and walker Goal status: INITIAL  2.  Patient will increase FOTO score to equal to or greater than  49   to demonstrate statistically significant improvement in mobility and quality  of life.  Baseline: 28 Goal status: INITIAL    3.   Patient will reduce timed up and go to <11 seconds to reduce fall risk and demonstrate improved transfer/gait ability. Baseline: 88 sec with walker and with Heavy UE use to come to standing position  Goal status: INITIAL  4.   Patient will increase 10 meter walk test to > .5 m/s as to improve gait speed for better community ambulation and to reduce fall risk. Baseline: Unable to ambulate this distance on eval  Goal status: INITIAL    ASSESSMENT:  CLINICAL IMPRESSION: Pt presents with good motivation for completion of PT activities and seems to be in good  spirits. Pt introduced to initial Hep for seated exercises to progress his strength at home. Pt still having pain with WB activities in the knee such as with STS and this will be monitored in future sessions. Will likely need progressive weightbearing activities mixed with un weighted therex to accommodate for discomfort felt in knee. Pt will continue to benefit from skilled physical therapy intervention to address impairments, improve QOL, and attain therapy goals.     OBJECTIVE IMPAIRMENTS: Abnormal gait, decreased activity tolerance, decreased balance, decreased endurance, decreased mobility, difficulty walking, decreased ROM, decreased strength, obesity, and pain.   ACTIVITY LIMITATIONS: carrying, lifting, bending, standing, squatting, stairs, transfers, bed mobility, bathing, toileting, dressing, and locomotion level  PARTICIPATION LIMITATIONS: driving, shopping, community activity, and yard work  PERSONAL FACTORS: Age, Behavior pattern, Fitness, Past/current experiences, Time since onset of injury/illness/exacerbation, and 3+ comorbidities: CHF, COPD, HLD, HTN  are also affecting patient's functional outcome.   REHAB POTENTIAL: Fair previous poor compliance with HEP and with therapy visits.   CLINICAL DECISION MAKING: Stable/uncomplicated  EVALUATION COMPLEXITY: Low  PLAN:  PT FREQUENCY: 2x/week  PT DURATION: 12 weeks  PLANNED INTERVENTIONS: Therapeutic exercises, Therapeutic activity, Neuromuscular re-education, Balance training, Gait training, Patient/Family education, Self Care, Joint mobilization, Stair training, and Prosthetic training  PLAN FOR NEXT SESSION: seated or supine therex mixed with closed chain strengthening as pt knee pain allows  Norman Herrlich, PT 05/30/2023, 11:54 AM

## 2023-06-01 ENCOUNTER — Ambulatory Visit: Payer: Medicare Other | Admitting: Physical Therapy

## 2023-06-01 DIAGNOSIS — R269 Unspecified abnormalities of gait and mobility: Secondary | ICD-10-CM

## 2023-06-01 DIAGNOSIS — R262 Difficulty in walking, not elsewhere classified: Secondary | ICD-10-CM

## 2023-06-01 DIAGNOSIS — R2689 Other abnormalities of gait and mobility: Secondary | ICD-10-CM

## 2023-06-01 DIAGNOSIS — R2681 Unsteadiness on feet: Secondary | ICD-10-CM

## 2023-06-01 DIAGNOSIS — M6281 Muscle weakness (generalized): Secondary | ICD-10-CM

## 2023-06-01 NOTE — Therapy (Signed)
OUTPATIENT PHYSICAL THERAPY NEURO TREATMENT   Patient Name: Christopher [name] MRN: [id number] DOB:[birth year], 72 y.o., male Today's Date: 06/01/2023   PCP: Danella Penton, MD  REFERRING PROVIDER: Roby Lofts, MD    END OF SESSION:  PT End of Session - 06/01/23 1024     Visit Number 3    Number of Visits 24    Date for PT Re-Evaluation 08/15/23    Authorization Type [employer] Medicare    Progress Note Due on Visit 10    PT Start Time 1021    PT Stop Time 1057    PT Time Calculation (min) 36 min    Equipment Utilized During Treatment Gait belt    Activity Tolerance Patient tolerated treatment well    Behavior During Therapy WFL for tasks assessed/performed              Past Medical History:  Diagnosis Date   Anxiety    CHF (congestive heart failure) (HCC)    COPD (chronic obstructive pulmonary disease) (HCC)    Depression    Dysrhythmia    Hyperlipidemia    Hypertension    Hypothyroidism    Sleep apnea    Past Surgical History:  Procedure Laterality Date   FRACTURE SURGERY     ORIF ANKLE FRACTURE Left 12/17/2020   Procedure: OPEN REDUCTION INTERNAL FIXATION (ORIF) ANKLE FRACTURE;  Surgeon: Gwyneth Revels, DPM;  Location: [facility];  Service: Podiatry;  Laterality: Left;   ORIF FEMUR FRACTURE Left 01/20/2023   Procedure: OPEN REDUCTION INTERNAL FIXATION (ORIF) DISTAL FEMUR FRACTURE;  Surgeon: Roby Lofts, MD;  Location: [facility];  Service: Orthopedics;  Laterality: Left;   Patient Active Problem List   Diagnosis Date Noted   Closed left subtrochanteric femur fracture, initial encounter (HCC) 01/19/2023   CKD (chronic kidney disease) stage 4, GFR 15-29 ml/min (HCC) 01/26/2022   Generalized weakness 10/16/2021   Chronic respiratory failure with hypoxia (HCC) 10/16/2021   Hypotension    SIRS (systemic inflammatory response syndrome) (HCC)    AF (paroxysmal atrial fibrillation) (HCC)    Syncope    Severe sepsis (HCC) 05/20/2021   Pressure injury of skin  04/25/2021   Acute respiratory failure (HCC) 04/21/2021   Acute kidney injury superimposed on CKD (HCC) 04/21/2021   Stage 3b chronic kidney disease (CKD) (HCC) 03/22/2021   Leukocytosis 03/22/2021   Osteomyelitis of ankle or foot, acute, left (HCC) 03/17/2021   Acute on chronic respiratory failure (HCC) 03/17/2021   Osteomyelitis (HCC) 12/16/2020   Depression 12/16/2020   Medicare annual wellness visit, initial 11/14/2019   B12 deficiency 11/14/2019   Hyperlipidemia 10/31/2019   Primary osteoarthritis of right knee 03/16/2019   Sleep apnea 10/31/2018   Traumatic incomplete tear of left rotator cuff 10/02/2018   Morbid obesity with BMI of 45.0-49.9, adult (HCC) 09/20/2018   Hypothyroidism 09/20/2018   Chronic diastolic CHF (congestive heart failure) (HCC) 09/20/2018   Benign essential hypertension 09/20/2018    ONSET DATE: 01/19/23  REFERRING DIAG: N82.956O (ICD-10-CM) - Closed comminuted supracondylar fracture of left femur (HCC)   THERAPY DIAG:  No diagnosis found.  Rationale for Evaluation and Treatment: Rehabilitation  SUBJECTIVE:  SUBJECTIVE STATEMENT: pt reports no changes since last session. Pt did not get to his HEP in the last date and was encouraged to complete at lease some over the weekend.   Pt accompanied by: family member  PERTINENT HISTORY: ORIF of left supracondylar distal femur on 01/20/23. Went to SNF on 01/25/23 for rehab. Pt is here for femoral fracture. When pt was stepping out of the shower his leg gave out and he fell onto the floor. Pt is working on getting the shower changed to make it a simple walk in shower. Pt  reports most of his falls are in the bathroom. Pt has a bar in the shower but is unable to have a bar on the shower itself due to the set up. Pt is generally  inactive and does not walk much around the house, relies on the wheelchair for mobility. Pt wants to get out of the wheel chair for mobility, at least a little bit. When in rehab over the summer he was walking about 20-25 ft at a time.    PAIN:  Are you having pain? Yes: Pain location: left knee Aggravating factors: certain movements   PRECAUTIONS: Fall  RED FLAGS: None   WEIGHT BEARING RESTRICTIONS: No  FALLS: Has patient fallen in last 6 months? Yes. Number of falls 1  LIVING ENVIRONMENT: Lives with: lives with their family Lives in: House/apartment Stairs: Yes: Internal: 1 steps; none and External: 2 steps; can reach both Has following equipment at home: Dan Humphreys - 2 wheeled, Environmental consultant - 4 wheeled, Wheelchair (manual), shower chair, Grab bars, and grabber to pick things up  PLOF: Requires assistive device for independence, Needs assistance with ADLs, Needs assistance with homemaking, Needs assistance with gait, and Needs assistance with transfers  PATIENT GOALS: Improve his mobility.   OBJECTIVE:   DIAGNOSTIC FINDINGS:from 01/20/23:  " IMPRESSION: ORIF of comminuted distal femur fracture with improved fracture alignment from preoperative imaging"  COGNITION: Overall cognitive status: Within functional limits for tasks assessed   SENSATION: Not tested  COORDINATION: Not tested   EDEMA:  Not measured but B LE swelling seemed to be likely based on pt presentation    LOWER EXTREMITY ROM:     Active  Right Eval Left Eval  Hip flexion    Hip extension    Hip abduction    Hip adduction    Hip internal rotation    Hip external rotation    Knee flexion  95*  Knee extension Unable to achieve full extension in seated  Unable to achieve full extension in seated   Ankle dorsiflexion    Ankle plantarflexion    Ankle inversion    Ankle eversion     (Blank rows = not tested) *indicated measured in seated in WC and could not accurately ID bony landmarks.   LOWER EXTREMITY  MMT:    MMT Right Eval Left Eval  Hip flexion 4- 3+  Hip extension    Hip abduction  4-*  Hip adduction  4-  Hip internal rotation 4 3+  Hip external rotation 4 3  Knee flexion 4 4-  Knee extension 4 4-  Ankle dorsiflexion    Ankle plantarflexion    Ankle inversion    Ankle eversion    (Blank rows = not tested)    TRANSFERS: Assistive device utilized: Environmental consultant - 2 wheeled  Sit to stand: Min A and DTR holds walker, pt uses heavy UE assist  Stand to sit: CGA Chair to chair: CGA and uses walker,  close guarding Floor:  unable  RAMP:   Ramp Comments: uses manual WC propulsion  CURB:   Curb Comments: uses manual WC propulsion  STAIRS:   Comments: not tested at eval, recomment testing at another time.   GAIT: Gait pattern:  left LE in hip ER throughout to make up for lack of ankle motion and strength. Heavy UE use on bari 2WW  Distance walked: 20 ft  Assistive device utilized: Environmental consultant - 2 wheeled and bariatric walker  Level of assistance: CGA and WC follow  Comments: decreased step length  FUNCTIONAL TESTS:  5 times sit to stand: 29 sec with heavy UE assist and walker Timed up and go (TUG): 88 sec walker 10 meter walk test: unable  PATIENT SURVEYS:  FOTO 28 RAG:49  TODAY'S TREATMENT:                                                                                                                              Therex   LAQ 2 x 10 with 2# AW  Seated march 2 x 10  Heel raises 2 x 10  Seated step over 2 x 10 ea LE ( hip abd/ IR then hip ADD / ER) focus on foot positioning each rep  LE propped into hip flexion and knee extension, hip IR with YTB resistance, unable to achieve full IR ROM 2 x 10 reps  Rows on cable column x 15 with 17.5#, 2 x 15 with 22.5# Hip adduction 1 x 15 with bolster, pain noted, changed to kickball adduction and this reduced pain x 15 reps x 3 sec holds  Seated HS curls on cable machine LLE only 2 x 10 reps with 7.5#    PATIENT  EDUCATION: Education details: POC Person educated: Patient Education method: Explanation Education comprehension: verbalized understanding   HOME EXERCISE PROGRAM: Access Code: [email or web address] URL: [email or web address] Date: 05/30/2023 Prepared by: Thresa Ross  Exercises - Seated Long Arc Quad  - 1 x daily - 7 x weekly - 2 sets - 10 reps - 3 sec  hold - Seated March  - 1 x daily - 7 x weekly - 2 sets - 10 reps - Seated single leg step to side   - 1 x daily - 7 x weekly - 2 sets - 10 reps - Seated Heel Raise  - 1 x daily - 7 x weekly - 2 sets - 15 reps   GOALS: Goals reviewed with patient? Yes  SHORT TERM GOALS: Target date: 06/20/2023    Patient will be independent in home exercise program to improve strength/mobility for better functional independence with ADLs. Baseline: No HEP currently  Goal status: INITIAL   LONG TERM GOALS: Target date: 08/15/2023   1.  Patient (> 84 years old) will complete five times sit to stand test in <20 seconds and with UE support only on chair ( can use walker in standing for balance but cannot pull on walker) indicating an increased  LE strength and improved balance. Baseline: 29 sec with heavy UE assist and walker Goal status: INITIAL  2.  Patient will increase FOTO score to equal to or greater than  49   to demonstrate statistically significant improvement in mobility and quality of life.  Baseline: 28 Goal status: INITIAL    3.   Patient will reduce timed up and go to <11 seconds to reduce fall risk and demonstrate improved transfer/gait ability. Baseline: 88 sec with walker and with Heavy UE use to come to standing position  Goal status: INITIAL  4.   Patient will increase 10 meter walk test to > .5 m/s as to improve gait speed for better community ambulation and to reduce fall risk. Baseline: Unable to ambulate this distance on eval  Goal status: INITIAL    ASSESSMENT:  CLINICAL IMPRESSION: Pt presents with  good motivation for completion of PT activities and seems to be in good spirits. Pt challenged with seated therex to attenuate strength gains as knee pain from weightbearing last session was higher and did not want to further his pain with closed chain activities for the Les this date.  Will likely need progressive weightbearing activities mixed with un weighted therex to accommodate for discomfort felt in knee. Pt will continue to benefit from skilled physical therapy intervention to address impairments, improve QOL, and attain therapy goals.     OBJECTIVE IMPAIRMENTS: Abnormal gait, decreased activity tolerance, decreased balance, decreased endurance, decreased mobility, difficulty walking, decreased ROM, decreased strength, obesity, and pain.   ACTIVITY LIMITATIONS: carrying, lifting, bending, standing, squatting, stairs, transfers, bed mobility, bathing, toileting, dressing, and locomotion level  PARTICIPATION LIMITATIONS: driving, shopping, community activity, and yard work  PERSONAL FACTORS: Age, Behavior pattern, Fitness, Past/current experiences, Time since onset of injury/illness/exacerbation, and 3+ comorbidities: CHF, COPD, HLD, HTN  are also affecting patient's functional outcome.   REHAB POTENTIAL: Fair previous poor compliance with HEP and with therapy visits.   CLINICAL DECISION MAKING: Stable/uncomplicated  EVALUATION COMPLEXITY: Low  PLAN:  PT FREQUENCY: 2x/week  PT DURATION: 12 weeks  PLANNED INTERVENTIONS: Therapeutic exercises, Therapeutic activity, Neuromuscular re-education, Balance training, Gait training, Patient/Family education, Self Care, Joint mobilization, Stair training, and Prosthetic training  PLAN FOR NEXT SESSION: seated or supine therex mixed with closed chain strengthening as pt knee pain allows  Norman Herrlich, PT 06/01/2023, 10:24 AM

## 2023-06-06 ENCOUNTER — Ambulatory Visit: Payer: Medicare Other

## 2023-06-06 ENCOUNTER — Encounter: Payer: Self-pay | Admitting: Physical Therapy

## 2023-06-06 DIAGNOSIS — R2681 Unsteadiness on feet: Secondary | ICD-10-CM

## 2023-06-06 DIAGNOSIS — M6281 Muscle weakness (generalized): Secondary | ICD-10-CM

## 2023-06-06 DIAGNOSIS — R2689 Other abnormalities of gait and mobility: Secondary | ICD-10-CM

## 2023-06-06 DIAGNOSIS — R269 Unspecified abnormalities of gait and mobility: Secondary | ICD-10-CM

## 2023-06-06 DIAGNOSIS — R262 Difficulty in walking, not elsewhere classified: Secondary | ICD-10-CM | POA: Diagnosis not present

## 2023-06-06 NOTE — Therapy (Signed)
OUTPATIENT PHYSICAL THERAPY NEURO TREATMENT   Patient Name: Christopher [name] MRN: [id number] DOB:[birth year], 72 y.o., male Today's Date: 06/06/2023   PCP: Danella Penton, MD  REFERRING PROVIDER: Roby Lofts, MD    END OF SESSION:  PT End of Session - 06/06/23 1011     Visit Number 4    Number of Visits 24    Date for PT Re-Evaluation 08/15/23    Authorization Type [employer] Medicare    Progress Note Due on Visit 10    PT Start Time 1015    PT Stop Time 1057    PT Time Calculation (min) 42 min    Equipment Utilized During Treatment Gait belt    Activity Tolerance Patient tolerated treatment well    Behavior During Therapy WFL for tasks assessed/performed               Past Medical History:  Diagnosis Date   Anxiety    CHF (congestive heart failure) (HCC)    COPD (chronic obstructive pulmonary disease) (HCC)    Depression    Dysrhythmia    Hyperlipidemia    Hypertension    Hypothyroidism    Sleep apnea    Past Surgical History:  Procedure Laterality Date   FRACTURE SURGERY     ORIF ANKLE FRACTURE Left 12/17/2020   Procedure: OPEN REDUCTION INTERNAL FIXATION (ORIF) ANKLE FRACTURE;  Surgeon: Gwyneth Revels, DPM;  Location: [facility];  Service: Podiatry;  Laterality: Left;   ORIF FEMUR FRACTURE Left 01/20/2023   Procedure: OPEN REDUCTION INTERNAL FIXATION (ORIF) DISTAL FEMUR FRACTURE;  Surgeon: Roby Lofts, MD;  Location: [facility];  Service: Orthopedics;  Laterality: Left;   Patient Active Problem List   Diagnosis Date Noted   Closed left subtrochanteric femur fracture, initial encounter (HCC) 01/19/2023   CKD (chronic kidney disease) stage 4, GFR 15-29 ml/min (HCC) 01/26/2022   Generalized weakness 10/16/2021   Chronic respiratory failure with hypoxia (HCC) 10/16/2021   Hypotension    SIRS (systemic inflammatory response syndrome) (HCC)    AF (paroxysmal atrial fibrillation) (HCC)    Syncope    Severe sepsis (HCC) 05/20/2021   Pressure injury of skin  04/25/2021   Acute respiratory failure (HCC) 04/21/2021   Acute kidney injury superimposed on CKD (HCC) 04/21/2021   Stage 3b chronic kidney disease (CKD) (HCC) 03/22/2021   Leukocytosis 03/22/2021   Osteomyelitis of ankle or foot, acute, left (HCC) 03/17/2021   Acute on chronic respiratory failure (HCC) 03/17/2021   Osteomyelitis (HCC) 12/16/2020   Depression 12/16/2020   Medicare annual wellness visit, initial 11/14/2019   B12 deficiency 11/14/2019   Hyperlipidemia 10/31/2019   Primary osteoarthritis of right knee 03/16/2019   Sleep apnea 10/31/2018   Traumatic incomplete tear of left rotator cuff 10/02/2018   Morbid obesity with BMI of 45.0-49.9, adult (HCC) 09/20/2018   Hypothyroidism 09/20/2018   Chronic diastolic CHF (congestive heart failure) (HCC) 09/20/2018   Benign essential hypertension 09/20/2018    ONSET DATE: 01/19/23  REFERRING DIAG: W09.811B (ICD-10-CM) - Closed comminuted supracondylar fracture of left femur (HCC)   THERAPY DIAG:  Difficulty in walking, not elsewhere classified  Muscle weakness (generalized)  Unsteadiness on feet  Other abnormalities of gait and mobility  Abnormality of gait and mobility  Rationale for Evaluation and Treatment: Rehabilitation  SUBJECTIVE:  SUBJECTIVE STATEMENT:  pt reports no changes since last session. Did not complete HEP over the weekend.   Pt accompanied by: family member  PERTINENT HISTORY: ORIF of left supracondylar distal femur on 01/20/23. Went to SNF on 01/25/23 for rehab. Pt is here for femoral fracture. When pt was stepping out of the shower his leg gave out and he fell onto the floor. Pt is working on getting the shower changed to make it a simple walk in shower. Pt  reports most of his falls are in the bathroom. Pt has a bar in  the shower but is unable to have a bar on the shower itself due to the set up. Pt is generally inactive and does not walk much around the house, relies on the wheelchair for mobility. Pt wants to get out of the wheel chair for mobility, at least a little bit. When in rehab over the summer he was walking about 20-25 ft at a time.    PAIN:  Are you having pain? Yes: Pain location: left knee Aggravating factors: certain movements   PRECAUTIONS: Fall  RED FLAGS: None   WEIGHT BEARING RESTRICTIONS: No  FALLS: Has patient fallen in last 6 months? Yes. Number of falls 1  LIVING ENVIRONMENT: Lives with: lives with their family Lives in: House/apartment Stairs: Yes: Internal: 1 steps; none and External: 2 steps; can reach both Has following equipment at home: Dan Humphreys - 2 wheeled, Environmental consultant - 4 wheeled, Wheelchair (manual), shower chair, Grab bars, and grabber to pick things up  PLOF: Requires assistive device for independence, Needs assistance with ADLs, Needs assistance with homemaking, Needs assistance with gait, and Needs assistance with transfers  PATIENT GOALS: Improve his mobility.   OBJECTIVE:   DIAGNOSTIC FINDINGS:from 01/20/23:  " IMPRESSION: ORIF of comminuted distal femur fracture with improved fracture alignment from preoperative imaging"  COGNITION: Overall cognitive status: Within functional limits for tasks assessed   SENSATION: Not tested  COORDINATION: Not tested   EDEMA:  Not measured but B LE swelling seemed to be likely based on pt presentation    LOWER EXTREMITY ROM:     Active  Right Eval Left Eval  Hip flexion    Hip extension    Hip abduction    Hip adduction    Hip internal rotation    Hip external rotation    Knee flexion  95*  Knee extension Unable to achieve full extension in seated  Unable to achieve full extension in seated   Ankle dorsiflexion    Ankle plantarflexion    Ankle inversion    Ankle eversion     (Blank rows = not  tested) *indicated measured in seated in WC and could not accurately ID bony landmarks.   LOWER EXTREMITY MMT:    MMT Right Eval Left Eval  Hip flexion 4- 3+  Hip extension    Hip abduction  4-*  Hip adduction  4-  Hip internal rotation 4 3+  Hip external rotation 4 3  Knee flexion 4 4-  Knee extension 4 4-  Ankle dorsiflexion    Ankle plantarflexion    Ankle inversion    Ankle eversion    (Blank rows = not tested)    TRANSFERS: Assistive device utilized: Environmental consultant - 2 wheeled  Sit to stand: Min A and DTR holds Christopher [name], pt uses heavy UE assist  Stand to sit: CGA Chair to chair: CGA and uses Christopher [name], close guarding Floor:  unable  RAMP:   Ramp Comments: uses manual WC  propulsion  CURB:   Curb Comments: uses manual WC propulsion  STAIRS:   Comments: not tested at eval, recomment testing at another time.   GAIT: Gait pattern:  left LE in hip ER throughout to make up for lack of ankle motion and strength. Heavy UE use on bari 2WW  Distance walked: 20 ft  Assistive device utilized: Environmental consultant - 2 wheeled and bariatric Christopher [name]  Level of assistance: CGA and WC follow  Comments: decreased step length  FUNCTIONAL TESTS:  5 times sit to stand: 29 sec with heavy UE assist and Stpehen Petitjean Timed up and go (TUG): 88 sec Christopher [name] 10 meter walk test: unable  PATIENT SURVEYS:  FOTO 28 RAG:49  TODAY'S TREATMENT 06/06/23:                                                                                                                          Therex   LAQ 2 x 12 with 2# AW  Seated march 2 x 12 with 2# AW  Heel raises 2 x 12 with 2# AW Seated step over 2 x 10 ea LE ( hip abd/ IR then hip ADD / ER) focus on foot positioning each rep with 2# AW  Hip adduction ball squeeze 2 x 12 with rainbow kickball  Sit to stand 2 x 10 from w/c height on mat table   Ambulated from large mat table to Nustep with RW and CGA with gait belt donned.  Nustep level 2 x 5 minutes with B UE/LE  PATIENT  EDUCATION: Education details: POC Person educated: Patient Education method: Explanation Education comprehension: verbalized understanding   HOME EXERCISE PROGRAM: Access Code: [email or web address] URL: [email or web address] Date: 05/30/2023 Prepared by: Christopher [name]  Exercises - Seated Long Arc Quad  - 1 x daily - 7 x weekly - 2 sets - 10 reps - 3 sec  hold - Seated March  - 1 x daily - 7 x weekly - 2 sets - 10 reps - Seated single leg step to side   - 1 x daily - 7 x weekly - 2 sets - 10 reps - Seated Heel Raise  - 1 x daily - 7 x weekly - 2 sets - 15 reps   GOALS: Goals reviewed with patient? Yes  SHORT TERM GOALS: Target date: 06/20/2023    Patient will be independent in home exercise program to improve strength/mobility for better functional independence with ADLs. Baseline: No HEP currently  Goal status: INITIAL   LONG TERM GOALS: Target date: 08/15/2023   1.  Patient (> 18 years old) will complete five times sit to stand test in <20 seconds and with UE support only on chair ( can use Christopher [name] in standing for balance but cannot pull on Christopher [name]) indicating an increased LE strength and improved balance. Baseline: 29 sec with heavy UE assist and Christopher [name] Goal status: INITIAL  2.  Patient will increase FOTO score to equal to or greater than  49   to demonstrate  statistically significant improvement in mobility and quality of life.  Baseline: 28 Goal status: INITIAL    3.   Patient will reduce timed up and go to <11 seconds to reduce fall risk and demonstrate improved transfer/gait ability. Baseline: 88 sec with Christopher [name] and with Heavy UE use to come to standing position  Goal status: INITIAL  4.   Patient will increase 10 meter walk test to > .5 m/s as to improve gait speed for better community ambulation and to reduce fall risk. Baseline: Unable to ambulate this distance on eval  Goal status: INITIAL    ASSESSMENT:  CLINICAL IMPRESSION:   Pt presents with  good motivation for completion of PT activities and seems to be in good spirits. Session focused on seated LE strengthening and introduction of use of Nustep for improve LE endurance. Will likely need progressive weightbearing activities mixed with un weighted therex to accommodate for discomfort felt in knee. Pt will continue to benefit from skilled physical therapy intervention to address impairments, improve QOL, and attain therapy goals.     OBJECTIVE IMPAIRMENTS: Abnormal gait, decreased activity tolerance, decreased balance, decreased endurance, decreased mobility, difficulty walking, decreased ROM, decreased strength, obesity, and pain.   ACTIVITY LIMITATIONS: carrying, lifting, bending, standing, squatting, stairs, transfers, bed mobility, bathing, toileting, dressing, and locomotion level  PARTICIPATION LIMITATIONS: driving, shopping, community activity, and yard work  PERSONAL FACTORS: Age, Behavior pattern, Fitness, Past/current experiences, Time since onset of injury/illness/exacerbation, and 3+ comorbidities: CHF, COPD, HLD, HTN  are also affecting patient's functional outcome.   REHAB POTENTIAL: Fair previous poor compliance with HEP and with therapy visits.   CLINICAL DECISION MAKING: Stable/uncomplicated  EVALUATION COMPLEXITY: Low  PLAN:  PT FREQUENCY: 2x/week  PT DURATION: 12 weeks  PLANNED INTERVENTIONS: Therapeutic exercises, Therapeutic activity, Neuromuscular re-education, Balance training, Gait training, Patient/Family education, Self Care, Joint mobilization, Stair training, and Prosthetic training  PLAN FOR NEXT SESSION: seated or supine therex mixed with closed chain strengthening as pt knee pain allows  Viviann Spare, PT, DPT 06/06/2023, 10:12 AM

## 2023-06-08 ENCOUNTER — Ambulatory Visit: Payer: Medicare Other | Admitting: Physical Therapy

## 2023-06-10 ENCOUNTER — Other Ambulatory Visit: Payer: Self-pay

## 2023-06-10 ENCOUNTER — Emergency Department: Payer: Medicare Other

## 2023-06-10 ENCOUNTER — Inpatient Hospital Stay
Admission: EM | Admit: 2023-06-10 | Discharge: 2023-06-12 | DRG: 872 | Disposition: A | Discharge status: Home / Self Care | Payer: Medicare Other | Attending: Family Medicine | Admitting: Family Medicine

## 2023-06-10 DIAGNOSIS — I5032 Chronic diastolic (congestive) heart failure: Secondary | ICD-10-CM | POA: Diagnosis present

## 2023-06-10 DIAGNOSIS — I480 Paroxysmal atrial fibrillation: Secondary | ICD-10-CM

## 2023-06-10 DIAGNOSIS — Z7982 Long term (current) use of aspirin: Secondary | ICD-10-CM

## 2023-06-10 DIAGNOSIS — Z6841 Body Mass Index (BMI) 40.0 and over, adult: Secondary | ICD-10-CM

## 2023-06-10 DIAGNOSIS — L03312 Cellulitis of back [any part except buttock]: Secondary | ICD-10-CM | POA: Diagnosis present

## 2023-06-10 DIAGNOSIS — Z993 Dependence on wheelchair: Secondary | ICD-10-CM

## 2023-06-10 DIAGNOSIS — Z79899 Other long term (current) drug therapy: Secondary | ICD-10-CM

## 2023-06-10 DIAGNOSIS — Z8249 Family history of ischemic heart disease and other diseases of the circulatory system: Secondary | ICD-10-CM

## 2023-06-10 DIAGNOSIS — I130 Hypertensive heart and chronic kidney disease with heart failure and stage 1 through stage 4 chronic kidney disease, or unspecified chronic kidney disease: Secondary | ICD-10-CM | POA: Diagnosis present

## 2023-06-10 DIAGNOSIS — G4730 Sleep apnea, unspecified: Secondary | ICD-10-CM | POA: Diagnosis present

## 2023-06-10 DIAGNOSIS — E66813 Obesity, class 3: Secondary | ICD-10-CM | POA: Diagnosis present

## 2023-06-10 DIAGNOSIS — Z7951 Long term (current) use of inhaled steroids: Secondary | ICD-10-CM

## 2023-06-10 DIAGNOSIS — N1832 Chronic kidney disease, stage 3b: Secondary | ICD-10-CM | POA: Diagnosis present

## 2023-06-10 DIAGNOSIS — L89106 Pressure-induced deep tissue damage of unspecified part of back: Secondary | ICD-10-CM | POA: Diagnosis present

## 2023-06-10 DIAGNOSIS — I959 Hypotension, unspecified: Secondary | ICD-10-CM | POA: Diagnosis present

## 2023-06-10 DIAGNOSIS — N179 Acute kidney failure, unspecified: Secondary | ICD-10-CM | POA: Diagnosis present

## 2023-06-10 DIAGNOSIS — F419 Anxiety disorder, unspecified: Secondary | ICD-10-CM | POA: Diagnosis present

## 2023-06-10 DIAGNOSIS — R6520 Severe sepsis without septic shock: Secondary | ICD-10-CM | POA: Diagnosis present

## 2023-06-10 DIAGNOSIS — A419 Sepsis, unspecified organism: Principal | ICD-10-CM | POA: Diagnosis present

## 2023-06-10 DIAGNOSIS — I10 Essential (primary) hypertension: Secondary | ICD-10-CM | POA: Diagnosis present

## 2023-06-10 DIAGNOSIS — E662 Morbid (severe) obesity with alveolar hypoventilation: Secondary | ICD-10-CM | POA: Diagnosis present

## 2023-06-10 DIAGNOSIS — J8283 Eosinophilic asthma: Secondary | ICD-10-CM | POA: Diagnosis present

## 2023-06-10 DIAGNOSIS — J449 Chronic obstructive pulmonary disease, unspecified: Secondary | ICD-10-CM | POA: Diagnosis present

## 2023-06-10 DIAGNOSIS — F32A Depression, unspecified: Secondary | ICD-10-CM | POA: Diagnosis present

## 2023-06-10 DIAGNOSIS — Z888 Allergy status to other drugs, medicaments and biological substances status: Secondary | ICD-10-CM

## 2023-06-10 DIAGNOSIS — Z79890 Hormone replacement therapy (postmenopausal): Secondary | ICD-10-CM

## 2023-06-10 DIAGNOSIS — I2510 Atherosclerotic heart disease of native coronary artery without angina pectoris: Secondary | ICD-10-CM | POA: Diagnosis present

## 2023-06-10 DIAGNOSIS — E039 Hypothyroidism, unspecified: Secondary | ICD-10-CM | POA: Diagnosis present

## 2023-06-10 DIAGNOSIS — E785 Hyperlipidemia, unspecified: Secondary | ICD-10-CM | POA: Diagnosis present

## 2023-06-10 DIAGNOSIS — Z7901 Long term (current) use of anticoagulants: Secondary | ICD-10-CM

## 2023-06-10 MED ORDER — LACTATED RINGERS IV BOLUS: 1000.0000 mL | Freq: Once | INTRAVENOUS | Status: DC

## 2023-06-10 NOTE — ED Triage Notes (Signed)
Patient to arrive via [facility] with a code sepsis in the field. Per EMS, patient has been sitting for several days and has a sacral bed sore. EMS states patient urinated on hiself and the smell was potent and urine was "dark."  Cbg: 138 BP 105/54 HR 120 Co2 40 O2 94 RA - 2L placed in field.   500 LR 18g lfa 20 rfa.

## 2023-06-10 NOTE — ED Notes (Signed)
Patient is aox2.

## 2023-06-11 DIAGNOSIS — A419 Sepsis, unspecified organism: Secondary | ICD-10-CM | POA: Diagnosis present

## 2023-06-11 DIAGNOSIS — Z993 Dependence on wheelchair: Secondary | ICD-10-CM | POA: Diagnosis not present

## 2023-06-11 DIAGNOSIS — Z7982 Long term (current) use of aspirin: Secondary | ICD-10-CM | POA: Diagnosis not present

## 2023-06-11 DIAGNOSIS — J8283 Eosinophilic asthma: Secondary | ICD-10-CM | POA: Diagnosis present

## 2023-06-11 DIAGNOSIS — Z8249 Family history of ischemic heart disease and other diseases of the circulatory system: Secondary | ICD-10-CM | POA: Diagnosis not present

## 2023-06-11 DIAGNOSIS — J449 Chronic obstructive pulmonary disease, unspecified: Secondary | ICD-10-CM | POA: Diagnosis present

## 2023-06-11 DIAGNOSIS — I5032 Chronic diastolic (congestive) heart failure: Secondary | ICD-10-CM | POA: Diagnosis present

## 2023-06-11 DIAGNOSIS — Z7951 Long term (current) use of inhaled steroids: Secondary | ICD-10-CM | POA: Diagnosis not present

## 2023-06-11 DIAGNOSIS — R6520 Severe sepsis without septic shock: Secondary | ICD-10-CM | POA: Diagnosis present

## 2023-06-11 DIAGNOSIS — E785 Hyperlipidemia, unspecified: Secondary | ICD-10-CM | POA: Diagnosis present

## 2023-06-11 DIAGNOSIS — E039 Hypothyroidism, unspecified: Secondary | ICD-10-CM

## 2023-06-11 DIAGNOSIS — E66813 Obesity, class 3: Secondary | ICD-10-CM | POA: Diagnosis present

## 2023-06-11 DIAGNOSIS — L0390 Cellulitis, unspecified: Secondary | ICD-10-CM

## 2023-06-11 DIAGNOSIS — F32A Depression, unspecified: Secondary | ICD-10-CM | POA: Diagnosis present

## 2023-06-11 DIAGNOSIS — I480 Paroxysmal atrial fibrillation: Secondary | ICD-10-CM

## 2023-06-11 DIAGNOSIS — I130 Hypertensive heart and chronic kidney disease with heart failure and stage 1 through stage 4 chronic kidney disease, or unspecified chronic kidney disease: Secondary | ICD-10-CM | POA: Diagnosis present

## 2023-06-11 DIAGNOSIS — N1832 Chronic kidney disease, stage 3b: Secondary | ICD-10-CM | POA: Diagnosis present

## 2023-06-11 DIAGNOSIS — I2510 Atherosclerotic heart disease of native coronary artery without angina pectoris: Secondary | ICD-10-CM | POA: Diagnosis present

## 2023-06-11 DIAGNOSIS — N179 Acute kidney failure, unspecified: Secondary | ICD-10-CM | POA: Diagnosis present

## 2023-06-11 DIAGNOSIS — Z79890 Hormone replacement therapy (postmenopausal): Secondary | ICD-10-CM | POA: Diagnosis not present

## 2023-06-11 DIAGNOSIS — Z7901 Long term (current) use of anticoagulants: Secondary | ICD-10-CM | POA: Diagnosis not present

## 2023-06-11 DIAGNOSIS — Z6841 Body Mass Index (BMI) 40.0 and over, adult: Secondary | ICD-10-CM | POA: Diagnosis not present

## 2023-06-11 DIAGNOSIS — L03312 Cellulitis of back [any part except buttock]: Secondary | ICD-10-CM | POA: Diagnosis present

## 2023-06-11 DIAGNOSIS — E662 Morbid (severe) obesity with alveolar hypoventilation: Secondary | ICD-10-CM | POA: Diagnosis present

## 2023-06-11 DIAGNOSIS — L89106 Pressure-induced deep tissue damage of unspecified part of back: Secondary | ICD-10-CM | POA: Diagnosis present

## 2023-06-11 LAB — URINALYSIS, W/ REFLEX TO CULTURE (INFECTION SUSPECTED)
Bilirubin Urine: NEGATIVE
Glucose, UA: NEGATIVE mg/dL
Hgb urine dipstick: NEGATIVE
Ketones, ur: NEGATIVE mg/dL
Leukocytes,Ua: NEGATIVE
Nitrite: NEGATIVE
Protein, ur: NEGATIVE mg/dL
Specific Gravity, Urine: 1.023 (ref 1.005–1.030)
pH: 5 (ref 5.0–8.0)

## 2023-06-11 LAB — LACTIC ACID, PLASMA: Lactic Acid, Venous: 1.2 mmol/L (ref 0.5–1.9)

## 2023-06-11 LAB — CBC WITH DIFFERENTIAL/PLATELET
Abs Immature Granulocytes: 0.12 10*3/uL — ABNORMAL HIGH (ref 0.00–0.07)
Basophils Absolute: 0.1 10*3/uL (ref 0.0–0.1)
Basophils Relative: 0 %
Eosinophils Absolute: 0 10*3/uL (ref 0.0–0.5)
Eosinophils Relative: 0 %
HCT: 40.9 % (ref 39.0–52.0)
Hemoglobin: 12.7 g/dL — ABNORMAL LOW (ref 13.0–17.0)
Immature Granulocytes: 1 %
Lymphocytes Relative: 2 %
Lymphs Abs: 0.4 10*3/uL — ABNORMAL LOW (ref 0.7–4.0)
MCH: 26.4 pg (ref 26.0–34.0)
MCHC: 31.1 g/dL (ref 30.0–36.0)
MCV: 85 fL (ref 80.0–100.0)
Monocytes Absolute: 0.8 10*3/uL (ref 0.1–1.0)
Monocytes Relative: 5 %
Neutro Abs: 16 10*3/uL — ABNORMAL HIGH (ref 1.7–7.7)
Neutrophils Relative %: 92 %
Platelets: 249 10*3/uL (ref 150–400)
RBC: 4.81 MIL/uL (ref 4.22–5.81)
RDW: 15.1 % (ref 11.5–15.5)
WBC: 17.4 10*3/uL — ABNORMAL HIGH (ref 4.0–10.5)
nRBC: 0 % (ref 0.0–0.2)

## 2023-06-11 LAB — BASIC METABOLIC PANEL
Anion gap: 9 (ref 5–15)
BUN: 33 mg/dL — ABNORMAL HIGH (ref 8–23)
CO2: 26 mmol/L (ref 22–32)
Calcium: 8.3 mg/dL — ABNORMAL LOW (ref 8.9–10.3)
Chloride: 102 mmol/L (ref 98–111)
Creatinine, Ser: 1.78 mg/dL — ABNORMAL HIGH (ref 0.61–1.24)
GFR, Estimated: 40 mL/min — ABNORMAL LOW (ref >60)
Glucose, Bld: 129 mg/dL — ABNORMAL HIGH (ref 70–99)
Potassium: 4.5 mmol/L (ref 3.5–5.1)
Sodium: 137 mmol/L (ref 135–145)

## 2023-06-11 LAB — CK: Total CK: 40 U/L — ABNORMAL LOW (ref 49–397)

## 2023-06-11 LAB — COMPREHENSIVE METABOLIC PANEL
ALT: 9 U/L (ref 0–44)
AST: 13 U/L — ABNORMAL LOW (ref 15–41)
Albumin: 3.6 g/dL (ref 3.5–5.0)
Alkaline Phosphatase: 58 U/L (ref 38–126)
Anion gap: 11 (ref 5–15)
BUN: 33 mg/dL — ABNORMAL HIGH (ref 8–23)
CO2: 25 mmol/L (ref 22–32)
Calcium: 8.7 mg/dL — ABNORMAL LOW (ref 8.9–10.3)
Chloride: 100 mmol/L (ref 98–111)
Creatinine, Ser: 1.73 mg/dL — ABNORMAL HIGH (ref 0.61–1.24)
GFR, Estimated: 41 mL/min — ABNORMAL LOW (ref >60)
Glucose, Bld: 124 mg/dL — ABNORMAL HIGH (ref 70–99)
Potassium: 4.8 mmol/L (ref 3.5–5.1)
Sodium: 136 mmol/L (ref 135–145)
Total Bilirubin: 0.9 mg/dL (ref 0.3–1.2)
Total Protein: 6.8 g/dL (ref 6.5–8.1)

## 2023-06-11 LAB — CBC
HCT: 37.1 % — ABNORMAL LOW (ref 39.0–52.0)
Hemoglobin: 11.4 g/dL — ABNORMAL LOW (ref 13.0–17.0)
MCH: 26.5 pg (ref 26.0–34.0)
MCHC: 30.7 g/dL (ref 30.0–36.0)
MCV: 86.1 fL (ref 80.0–100.0)
Platelets: 224 10*3/uL (ref 150–400)
RBC: 4.31 MIL/uL (ref 4.22–5.81)
RDW: 15.4 % (ref 11.5–15.5)
WBC: 19 10*3/uL — ABNORMAL HIGH (ref 4.0–10.5)
nRBC: 0 % (ref 0.0–0.2)

## 2023-06-11 LAB — CORTISOL-AM, BLOOD: Cortisol - AM: 11.7 ug/dL (ref 6.7–22.6)

## 2023-06-11 LAB — APTT: aPTT: 36 s (ref 24–36)

## 2023-06-11 LAB — PROTIME-INR
INR: 1.2 (ref 0.8–1.2)
INR: 1.3 — ABNORMAL HIGH (ref 0.8–1.2)
Prothrombin Time: 15.7 s — ABNORMAL HIGH (ref 11.4–15.2)
Prothrombin Time: 16.2 s — ABNORMAL HIGH (ref 11.4–15.2)

## 2023-06-11 LAB — PROCALCITONIN
Procalcitonin: 0.47 ng/mL
Procalcitonin: 0.83 ng/mL

## 2023-06-11 MED ORDER — VANCOMYCIN HCL 1500 MG/300ML IV SOLN
1500.0000 mg | INTRAVENOUS | Status: DC
  Administered 2023-06-11 – 2023-06-12 (×2): 1500 mg via INTRAVENOUS
  Filled 2023-06-11 (×3): qty 300

## 2023-06-11 MED ORDER — LEVALBUTEROL HCL 0.63 MG/3ML IN NEBU: 0.6300 mg | INHALATION_SOLUTION | RESPIRATORY_TRACT | Status: DC | PRN

## 2023-06-11 MED ORDER — ASPIRIN 81 MG PO TBEC
81.0000 mg | DELAYED_RELEASE_TABLET | Freq: Every day | ORAL | Status: DC
  Administered 2023-06-11: 81 mg via ORAL
  Filled 2023-06-11: qty 1

## 2023-06-11 MED ORDER — VITAMIN D 25 MCG (1000 UNIT) PO TABS
1000.0000 [IU] | ORAL_TABLET | Freq: Every day | ORAL | Status: DC
  Administered 2023-06-11: 1000 [IU] via ORAL
  Filled 2023-06-11: qty 1

## 2023-06-11 MED ORDER — LACTATED RINGERS IV SOLN: INTRAVENOUS | Status: DC

## 2023-06-11 MED ORDER — TRAZODONE HCL 50 MG PO TABS: 25.0000 mg | ORAL_TABLET | Freq: Every evening | ORAL | Status: DC | PRN

## 2023-06-11 MED ORDER — CYANOCOBALAMIN 500 MCG PO TABS: 1000.0000 ug | ORAL_TABLET | ORAL | Status: DC

## 2023-06-11 MED ORDER — SODIUM CHLORIDE 0.9 % IV SOLN
2.0000 g | INTRAVENOUS | Status: DC
  Administered 2023-06-11 – 2023-06-12 (×2): 2 g via INTRAVENOUS
  Filled 2023-06-11 (×2): qty 20

## 2023-06-11 MED ORDER — HYDROCODONE-ACETAMINOPHEN 5-325 MG PO TABS: 1.0000 | ORAL_TABLET | Freq: Four times a day (QID) | ORAL | Status: DC | PRN

## 2023-06-11 MED ORDER — PROSIGHT PO TABS
1.0000 | ORAL_TABLET | Freq: Two times a day (BID) | ORAL | Status: DC
  Administered 2023-06-11 – 2023-06-12 (×4): 1 via ORAL
  Filled 2023-06-11 (×6): qty 1

## 2023-06-11 MED ORDER — LACTATED RINGERS IV SOLN
150.0000 mL/h | INTRAVENOUS | Status: DC
  Administered 2023-06-11 (×2): 150 mL/h via INTRAVENOUS

## 2023-06-11 MED ORDER — MIDODRINE HCL 5 MG PO TABS: 10.0000 mg | ORAL_TABLET | Freq: Three times a day (TID) | ORAL | Status: DC | PRN

## 2023-06-11 MED ORDER — PIPERACILLIN-TAZOBACTAM 3.375 G IVPB 30 MIN
3.3750 g | Freq: Once | INTRAVENOUS | Status: AC
  Administered 2023-06-11: 3.375 g via INTRAVENOUS
  Filled 2023-06-11: qty 50

## 2023-06-11 MED ORDER — ATORVASTATIN CALCIUM 20 MG PO TABS
20.0000 mg | ORAL_TABLET | Freq: Every day | ORAL | Status: DC
  Administered 2023-06-11: 20 mg via ORAL
  Filled 2023-06-11: qty 1

## 2023-06-11 MED ORDER — METHOCARBAMOL 500 MG PO TABS: 500.0000 mg | ORAL_TABLET | Freq: Four times a day (QID) | ORAL | Status: DC | PRN

## 2023-06-11 MED ORDER — SENNOSIDES-DOCUSATE SODIUM 8.6-50 MG PO TABS
1.0000 | ORAL_TABLET | Freq: Two times a day (BID) | ORAL | Status: DC
  Administered 2023-06-11 (×2): 1 via ORAL
  Filled 2023-06-11 (×4): qty 1

## 2023-06-11 MED ORDER — MAGNESIUM OXIDE -MG SUPPLEMENT 400 (240 MG) MG PO TABS
400.0000 mg | ORAL_TABLET | Freq: Every day | ORAL | Status: DC
  Administered 2023-06-11 – 2023-06-12 (×2): 400 mg via ORAL
  Filled 2023-06-11 (×2): qty 1

## 2023-06-11 MED ORDER — BUDESONIDE 0.25 MG/2ML IN SUSP
0.2500 mg | Freq: Two times a day (BID) | RESPIRATORY_TRACT | Status: DC
  Administered 2023-06-11 – 2023-06-12 (×3): 0.25 mg via RESPIRATORY_TRACT
  Filled 2023-06-11 (×3): qty 2

## 2023-06-11 MED ORDER — FLUTICASONE PROPIONATE HFA 110 MCG/ACT IN AERO
1.0000 | INHALATION_SPRAY | Freq: Two times a day (BID) | RESPIRATORY_TRACT | Status: DC
  Filled 2023-06-11: qty 12

## 2023-06-11 MED ORDER — VANCOMYCIN HCL 2000 MG/400ML IV SOLN
2000.0000 mg | Freq: Once | INTRAVENOUS | Status: AC
  Administered 2023-06-11: 2000 mg via INTRAVENOUS
  Filled 2023-06-11: qty 400

## 2023-06-11 MED ORDER — MAGNESIUM HYDROXIDE 400 MG/5ML PO SUSP: 30.0000 mL | Freq: Every day | ORAL | Status: DC | PRN

## 2023-06-11 MED ORDER — APIXABAN 5 MG PO TABS
5.0000 mg | ORAL_TABLET | Freq: Two times a day (BID) | ORAL | Status: DC
  Administered 2023-06-11 – 2023-06-12 (×3): 5 mg via ORAL
  Filled 2023-06-11 (×4): qty 1

## 2023-06-11 MED ORDER — POLYETHYLENE GLYCOL 3350 17 G PO PACK
17.0000 g | PACK | Freq: Two times a day (BID) | ORAL | Status: DC
  Filled 2023-06-11 (×3): qty 1

## 2023-06-11 MED ORDER — SODIUM CHLORIDE 0.9 % IV BOLUS
1000.0000 mL | Freq: Once | INTRAVENOUS | Status: AC
  Administered 2023-06-11: 1000 mL via INTRAVENOUS

## 2023-06-11 MED ORDER — POTASSIUM CHLORIDE CRYS ER 20 MEQ PO TBCR
20.0000 meq | EXTENDED_RELEASE_TABLET | Freq: Every day | ORAL | Status: DC
  Administered 2023-06-11 – 2023-06-12 (×2): 20 meq via ORAL
  Filled 2023-06-11 (×2): qty 1

## 2023-06-11 MED ORDER — LEVOTHYROXINE SODIUM 88 MCG PO TABS
88.0000 ug | ORAL_TABLET | Freq: Every day | ORAL | Status: DC
  Administered 2023-06-11 – 2023-06-12 (×2): 88 ug via ORAL
  Filled 2023-06-11 (×3): qty 1

## 2023-06-11 MED ORDER — VANCOMYCIN HCL IN DEXTROSE 1-5 GM/200ML-% IV SOLN: 1000.0000 mg | Freq: Once | INTRAVENOUS | Status: DC

## 2023-06-11 MED ORDER — ALUM & MAG HYDROXIDE-SIMETH 200-200-20 MG/5ML PO SUSP: 15.0000 mL | Freq: Four times a day (QID) | ORAL | Status: DC | PRN

## 2023-06-11 MED ORDER — ONDANSETRON HCL 4 MG PO TABS: 4.0000 mg | ORAL_TABLET | Freq: Four times a day (QID) | ORAL | Status: DC | PRN

## 2023-06-11 MED ORDER — LORATADINE 10 MG PO TABS
10.0000 mg | ORAL_TABLET | Freq: Every day | ORAL | Status: DC
  Administered 2023-06-11 – 2023-06-12 (×2): 10 mg via ORAL
  Filled 2023-06-11 (×2): qty 1

## 2023-06-11 MED ORDER — SERTRALINE HCL 50 MG PO TABS
50.0000 mg | ORAL_TABLET | Freq: Every day | ORAL | Status: DC
  Administered 2023-06-11 – 2023-06-12 (×2): 50 mg via ORAL
  Filled 2023-06-11 (×2): qty 1

## 2023-06-11 MED ORDER — BUPROPION HCL ER (SR) 150 MG PO TB12
150.0000 mg | ORAL_TABLET | Freq: Two times a day (BID) | ORAL | Status: DC
  Administered 2023-06-11 – 2023-06-12 (×3): 150 mg via ORAL
  Filled 2023-06-11 (×4): qty 1

## 2023-06-11 MED ORDER — BISACODYL 10 MG RE SUPP: 10.0000 mg | Freq: Every day | RECTAL | Status: DC | PRN

## 2023-06-11 MED ORDER — ACETAMINOPHEN 325 MG PO TABS: 650.0000 mg | ORAL_TABLET | Freq: Four times a day (QID) | ORAL | Status: DC | PRN

## 2023-06-11 MED ORDER — ONDANSETRON HCL 4 MG/2ML IJ SOLN: 4.0000 mg | Freq: Four times a day (QID) | INTRAMUSCULAR | Status: DC | PRN

## 2023-06-11 MED ORDER — TORSEMIDE 20 MG PO TABS
20.0000 mg | ORAL_TABLET | Freq: Every day | ORAL | Status: DC
  Administered 2023-06-11: 20 mg via ORAL
  Filled 2023-06-11: qty 1

## 2023-06-11 MED ORDER — ACETAMINOPHEN 650 MG RE SUPP: 650.0000 mg | Freq: Four times a day (QID) | RECTAL | Status: DC | PRN

## 2023-06-11 NOTE — ED Notes (Signed)
Pts' BP 90/50, MAP 62. Dr. Lyn Hollingshead informed via epic secure chat.

## 2023-06-11 NOTE — Hospital Course (Signed)
HPI: Christopher [name] is a 72 y.o. Caucasian male with medical history significant for CHF, COPD, hypertension and dyslipidemia as well as hypothyroidism,who presented to the emergency room with acute onset of low back and gluteal mild pain with associated redness, warmth and tenderness with erythema which is been developing over the last couple weeks.   Hospital course / significant events:  10/18: to ED 10/19: admitted to hospitalist service for sepsis d/t cellulitis  Consultants:  ***  Procedures/Surgeries: ***      ASSESSMENT & PLAN:   Sepsis due to cellulitis (HCC) Tachycardia, tachypnea, leukocytosis w/ infectious source cellulitis of the lower back and gluteal areas. IV Rocephin and vancomycin. Warm compresses  follow blood cultures.   Chronic diastolic CHF not in exacerbation  Paroxysmal atrial fibrillation HTN w/ hx hypotension  HLD CAD continue Eliquis 5 mg bid (on 2.5 mg at home, pharmacy recs for 5 mg) ASA, statin, midodrine, torsemide,   CKD3b, stable Monitor BMP  Eosinophilic asthma  Bronchodilators prn  Home Flovent   Hypothyroidism continue Synthroid.   Depression continue Wellbutrin XL and Zoloft   OSA/OHS CPAP recommended, *** adherence     Morbid obesity based on BMI: Body mass index is 42.45 kg/m.  Underweight - under 18.5  normal weight - 18.5 to 24.9 overweight - 25 to 29.9 obese - 30 or more   DVT prophylaxis: Eliquis  IV fluids: no continuous IV fluids  Nutrition: cardiac diet  Central lines / invasive devices: none  Code Status: FULL CODE  ACP documentation reviewed:  none on file in [facility]  TOC needs: TBD Barriers to dispo / significant pending items: clinical improvement, blood culture results

## 2023-06-11 NOTE — Progress Notes (Signed)
PROGRESS NOTE - BRIEF, SEE FULL H&P FROM EARLIER Christopher [name]   ZOX:[id number] DOB: 13-Dec-1950  DOA: 06/10/2023 Date of Service: 06/11/23 which is hospital day 0  PCP: Danella Penton, MD    HPI: Christopher [name] is a 72 y.o. Caucasian male with medical history significant for CHF, COPD, hypertension and dyslipidemia as well as hypothyroidism,who presented to the emergency room with acute onset of low back and gluteal mild pain with associated redness, warmth and tenderness with erythema which is been developing over the last couple weeks.      ASSESSMENT & PLAN:   Sepsis due to cellulitis (HCC) Tachycardia, tachypnea, leukocytosis w/ infectious source cellulitis of the lower back and gluteal areas. IV Rocephin and vancomycin. Warm compresses  follow blood cultures.   Chronic diastolic CHF not in exacerbation  Paroxysmal atrial fibrillation HTN w/ hx hypotension  HLD CAD continue Eliquis 5 mg bid (on 2.5 mg at home, pharmacy recs for 5 mg) ASA, statin, midodrine, torsemide,   CKD3b, stable Monitor BMP  Eosinophilic asthma  Bronchodilators prn  Home Flovent   Hypothyroidism continue Synthroid.   Depression continue Wellbutrin XL and Zoloft   OSA/OHS CPAP recommended   Morbid obesity based on BMI: Body mass index is 42.45 kg/m.  Underweight - under 18.5  normal weight - 18.5 to 24.9 overweight - 25 to 29.9 obese - 30 or more   DVT prophylaxis: Eliquis  IV fluids: no continuous IV fluids  Nutrition: cardiac diet  Central lines / invasive devices: none  Code Status: FULL CODE  ACP documentation reviewed:  none on file in [facility]  TOC needs: TBD Barriers to dispo / significant pending items: clinical improvement, blood culture results              Subjective / Brief ROS:  Patient reports no cocnersn at this time  Denies CP/SOB.  Pain controlled.  Denies new weakness.  Tolerating diet.  Reports no concerns w/ urination/defecation.    Family Communication: daughter is at bedside on rounds        Data Reviewed:  I have personally reviewed the following...  CBC: Recent Labs  Lab 06/11/23 0010 06/11/23 0602  WBC 17.4* 19.0*  NEUTROABS 16.0*  --   HGB 12.7* 11.4*  HCT 40.9 37.1*  MCV 85.0 86.1  PLT 249 224   Basic Metabolic Panel: Recent Labs  Lab 06/11/23 0010 06/11/23 0602  NA 136 137  K 4.8 4.5  CL 100 102  CO2 25 26  GLUCOSE 124* 129*  BUN 33* 33*  CREATININE 1.73* 1.78*  CALCIUM 8.7* 8.3*   GFR: Estimated Creatinine Clearance: 54.9 mL/min (A) (by C-G formula based on SCr of 1.78 mg/dL (H)). Liver Function Tests: Recent Labs  Lab 06/11/23 0010  AST 13*  ALT 9  ALKPHOS 58  BILITOT 0.9  PROT 6.8  ALBUMIN 3.6   No results for input(s): "LIPASE", "AMYLASE" in the last 168 hours. No results for input(s): "AMMONIA" in the last 168 hours. Coagulation Profile: Recent Labs  Lab 06/11/23 0010 06/11/23 0602  INR 1.2 1.3*   Cardiac Enzymes: Recent Labs  Lab 06/11/23 0010  CKTOTAL 40*   BNP (last 3 results) No results for input(s): "PROBNP" in the last 8760 hours. HbA1C: No results for input(s): "HGBA1C" in the last 72 hours. CBG: No results for input(s): "GLUCAP" in the last 168 hours. Lipid Profile: No results for input(s): "CHOL", "HDL", "LDLCALC", "TRIG", "CHOLHDL", "LDLDIRECT" in the last 72 hours. Thyroid  Function Tests: No results for input(s): "TSH", "T4TOTAL", "FREET4", "T3FREE", "THYROIDAB" in the last 72 hours. Anemia Panel: No results for input(s): "VITAMINB12", "FOLATE", "FERRITIN", "TIBC", "IRON", "RETICCTPCT" in the last 72 hours. Most Recent Urinalysis On File:     Component Value Date/Time   COLORURINE YELLOW (A) 06/11/2023 0613   APPEARANCEUR CLEAR (A) 06/11/2023 0613   LABSPEC 1.023 06/11/2023 0613   PHURINE 5.0 06/11/2023 0613   GLUCOSEU NEGATIVE 06/11/2023 0613   HGBUR NEGATIVE 06/11/2023 0613   BILIRUBINUR NEGATIVE 06/11/2023 0613   KETONESUR  NEGATIVE 06/11/2023 0613   PROTEINUR NEGATIVE 06/11/2023 0613   NITRITE NEGATIVE 06/11/2023 0613   LEUKOCYTESUR NEGATIVE 06/11/2023 4098   Sepsis Labs: @LABRCNTIP (procalcitonin:4,lacticidven:4) Microbiology: Recent Results (from the past 240 hour(s))  Blood Culture (routine x 2)     Status: None (Preliminary result)   Collection Time: 06/11/23 12:10 AM   Specimen: BLOOD  Result Value Ref Range Status   Specimen Description BLOOD BLOOD RIGHT HAND  Final   Special Requests   Final    BOTTLES DRAWN AEROBIC AND ANAEROBIC Blood Culture adequate volume   Culture   Final    NO GROWTH < 12 HOURS Performed at [facility], [address] [place], [place] [address]    Report Status PENDING  Incomplete  Blood Culture (routine x 2)     Status: None (Preliminary result)   Collection Time: 06/11/23 12:15 AM   Specimen: BLOOD  Result Value Ref Range Status   Specimen Description BLOOD BLOOD LEFT WRIST  Final   Special Requests   Final    BOTTLES DRAWN AEROBIC AND ANAEROBIC Blood Culture adequate volume   Culture   Final    NO GROWTH < 12 HOURS Performed at [facility], [address], [place], [place] [address]    Report Status PENDING  Incomplete      Radiology Studies last 3 days: DG Chest Port 1 View  Result Date: 06/11/2023 CLINICAL DATA:  Question of sepsis to evaluate for abnormality. EXAM: PORTABLE CHEST 1 VIEW COMPARISON:  01/25/2023 FINDINGS: Cardiac enlargement. No vascular congestion, edema, or consolidation. No pleural effusions. No pneumothorax. Mediastinal contours appear intact. IMPRESSION: Cardiac enlargement.  Lungs are clear. Electronically Signed   By: Burman Nieves M.D.   On: 06/11/2023 00:27       Sunnie Nielsen, DO [facility] 06/11/2023, 9:51 AM    Dictation software may have been used to generate the above note. Typos may occur and escape review in typed/dictated notes. Please contact Dr Lyn Hollingshead directly for clarity if  needed.  Staff may message me via secure chat in Epic  but this may not receive an immediate response,  please page me for urgent matters!  If 7PM-7AM, please contact night coverage [email or web address]

## 2023-06-11 NOTE — Assessment & Plan Note (Signed)
-   We will can continue Eliquis.

## 2023-06-11 NOTE — Assessment & Plan Note (Addendum)
-   Patient will be admitted to a medical telemetry bed. - This is manifested by tachycardia and tachypnea with leukocytosis. - This related to cellulitis of the lower back and gluteal areas. - We will continue VAC therapy with IV Rocephin and vancomycin. - Warm compresses will be utilized. - We will follow blood cultures.

## 2023-06-11 NOTE — Assessment & Plan Note (Addendum)
-   Continue Wellbutrin XL and Zoloft

## 2023-06-11 NOTE — Assessment & Plan Note (Signed)
-   We will continue Synthroid. 

## 2023-06-11 NOTE — H&P (Signed)
Christopher [name]   PATIENT NAME: Christopher [name]    MR#:  [id number]  DATE OF BIRTH:  Oct 22, 1950  DATE OF ADMISSION:  06/10/2023  PRIMARY CARE PHYSICIAN: Danella Penton, MD   Patient is coming from: Home  REQUESTING/REFERRING PHYSICIAN: Delton Prairie, MD  CHIEF COMPLAINT:   Chief Complaint  Patient presents with   Code Sepsis    HISTORY OF PRESENT ILLNESS:  Christopher [name] is a 72 y.o. Caucasian male with medical history significant for CHF, COPD, hypertension and dyslipidemia as well as hypothyroidism,who presented to the emergency room with acute onset of low back and gluteal mild pain with associated redness, warmth and tenderness with erythema which is been developing over the last couple weeks.  He admitted to mild fever of 100.5 at home without chills.  He vomited once today.  No dysuria, oliguria or hematuria or flank pain.  No chest pain or palpitations.  No cough or wheezing or dyspnea.  ED Course: When the patient came to the ER, BP was 11/45 with heart rate of 116, temperature 100.1 and respiratory rate of 17 and later 23.  Labs revealed a BUN of 33 with a creatinine 1.73 with otherwise unremarkable CMP.  Total CK was 40 lactic acid was 1.2 procalcitonin 0.47.  CBC showed leukocytosis 17.4 with neutrophilia INR was 1.2 with PT 15.7.  UA was unremarkable. EKG as reviewed by me : EKG showed sinus tachycardia with rate 104 with RSR-in V1 and inferior Q waves. Imaging: Pulm which x-ray showed cardiac enlargement with clear lungs.  The patient was given IV vancomycin and Zosyn and 1 L bolus of IV normal saline.  He will be admitted to a telemetry medical bed for further evaluation and management. PAST MEDICAL HISTORY:   Past Medical History:  Diagnosis Date   Anxiety    CHF (congestive heart failure) (HCC)    COPD (chronic obstructive pulmonary disease) (HCC)    Depression    Dysrhythmia    Hyperlipidemia    Hypertension    Hypothyroidism    Sleep apnea     PAST  SURGICAL HISTORY:   Past Surgical History:  Procedure Laterality Date   FRACTURE SURGERY     ORIF ANKLE FRACTURE Left 12/17/2020   Procedure: OPEN REDUCTION INTERNAL FIXATION (ORIF) ANKLE FRACTURE;  Surgeon: Gwyneth Revels, DPM;  Location: [facility] ORS;  Service: Podiatry;  Laterality: Left;   ORIF FEMUR FRACTURE Left 01/20/2023   Procedure: OPEN REDUCTION INTERNAL FIXATION (ORIF) DISTAL FEMUR FRACTURE;  Surgeon: Roby Lofts, MD;  Location: [facility] OR;  Service: Orthopedics;  Laterality: Left;    SOCIAL HISTORY:   Social History   Tobacco Use   Smoking status: Never   Smokeless tobacco: Never  Substance Use Topics   Alcohol use: Not Currently    FAMILY HISTORY:   Family History  Problem Relation Age of Onset   Hypertension Mother     DRUG ALLERGIES:   Allergies  Allergen Reactions   Propranolol Other (See Comments)    Hallucinations    Albuterol Other (See Comments)    Causes afib    Gramineae Pollens Other (See Comments)    Sneeze and watery eyes Sneeze and watery eyes   Other Other (See Comments)    Other reaction(s): Other (See Comments) Unable to breath when around cats Other reaction(s): Other (See Comments) Unable to breath when around cats    REVIEW OF SYSTEMS:   ROS As per history of present illness. All pertinent systems were  reviewed above. Constitutional, HEENT, cardiovascular, respiratory, GI, GU, musculoskeletal, neuro, psychiatric, endocrine, integumentary and hematologic systems were reviewed and are otherwise negative/unremarkable except for positive findings mentioned above in the HPI.   MEDICATIONS AT HOME:   Prior to Admission medications   Medication Sig Start Date End Date Taking? Authorizing Provider  acetaminophen (TYLENOL) 325 MG tablet Take 2 tablets (650 mg total) by mouth every 6 (six) hours as needed for mild pain, fever, headache or moderate pain. 05/23/21   Alford Highland, MD  alum & mag hydroxide-simeth (MAALOX/MYLANTA) 200-200-20  MG/5ML suspension Take 15 mLs by mouth every 6 (six) hours as needed for indigestion or heartburn. 01/25/23   Marguerita Merles Latif, DO  apixaban (ELIQUIS) 2.5 MG TABS tablet Take 1 tablet (2.5 mg total) by mouth 2 (two) times daily. 01/24/23 02/23/23  [place] Bali, PA-C  aspirin 81 MG EC tablet Take 81 mg by mouth at bedtime.    [provider]  atorvastatin (LIPITOR) 20 MG tablet Take 20 mg by mouth at bedtime. 10/06/20   [provider]  bisacodyl (DULCOLAX) 10 MG suppository Place 1 suppository (10 mg total) rectally daily as needed for moderate constipation. 01/25/23   Marguerita Merles Latif, DO  buPROPion (WELLBUTRIN SR) 150 MG 12 hr tablet Take 150 mg by mouth 2 (two) times daily. After 1st meal of day and after dinner 12/02/20   [provider]  Cholecalciferol 25 MCG (1000 UT) capsule Take 1,000 Units by mouth at bedtime.    [provider]  cyanocobalamin 1000 MCG tablet Take 1,000 mcg by mouth 2 (two) times a week. At bedtime. Sundays and Wednesdays    [provider]  FLOVENT HFA 110 MCG/ACT inhaler Inhale 1 puff into the lungs 2 (two) times daily. 07/29/21   [provider]  HYDROcodone-acetaminophen (NORCO/VICODIN) 5-325 MG tablet Take 1-2 tablets by mouth every 6 (six) hours as needed for moderate pain or severe pain. 01/24/23   [place] Bali, PA-C  ipratropium (ATROVENT) 0.06 % nasal spray Place 2 sprays into both nostrils 3 (three) times daily as needed for rhinitis. 10/07/21   [provider]  levalbuterol Pauline Aus HFA) 45 MCG/ACT inhaler Inhale 2 puffs into the lungs every 4 (four) hours as needed for wheezing.    [provider]  levothyroxine (SYNTHROID) 88 MCG tablet Take 88 mcg by mouth daily. 30 to 60 minutes before breakfast on an empty stomach and with a glass of water 12/02/20   [provider]  loratadine (CLARITIN) 10 MG tablet Take 10 mg by mouth daily.    [provider]  Magnesium 250 MG TABS  Take 1 tablet by mouth daily.    [provider]  methocarbamol (ROBAXIN) 500 MG tablet Take 1 tablet (500 mg total) by mouth every 6 (six) hours as needed for muscle spasms. 01/24/23   [place] Bali, PA-C  midodrine (PROAMATINE) 10 MG tablet Take 1 tablet (10 mg total) by mouth 3 (three) times daily as needed (for SBP < 90mm Hg or DBP < 50 mmHg). 01/25/23   Marguerita Merles Latif, DO  Multiple Vitamins-Minerals (PRESERVISION AREDS 2) CAPS Take 1 capsule by mouth 2 (two) times daily. With food. After 1st meal of day and after dinner    [provider]  ondansetron (ZOFRAN) 4 MG tablet Take 1 tablet (4 mg total) by mouth every 6 (six) hours as needed for nausea. 01/25/23   Marguerita Merles Latif, DO  OXYGEN Inhale 2 L into the lungs continuous.  [provider]  polyethylene glycol (MIRALAX / GLYCOLAX) 17 g packet Take 17 g by mouth 2 (two) times daily. 01/25/23   Marguerita Merles Latif, DO  potassium chloride SA (KLOR-CON) 20 MEQ tablet Take 1 tablet (20 mEq total) by mouth daily. 05/24/21   Alford Highland, MD  senna-docusate (SENOKOT-S) 8.6-50 MG tablet Take 1 tablet by mouth 2 (two) times daily. 01/25/23   Marguerita Merles Latif, DO  sertraline (ZOLOFT) 50 MG tablet Take 50 mg by mouth daily.    [provider]  torsemide (DEMADEX) 20 MG tablet Take 1 tablet (20 mg total) by mouth daily. 05/24/21   Alford Highland, MD  trolamine salicylate (ASPERCREME) 10 % cream Apply 1 application topically as needed for muscle pain. Apply to knees and feet    [provider]  Vitamins A & D (VITAMIN A & D) ointment Apply 1 application topically as needed. To groin area.    [provider]      VITAL SIGNS:  Blood pressure (!) 99/56, pulse 87, temperature 98.7 F (37.1 C), temperature source Oral, resp. rate (!) 22, height 6' (1.829 m), weight (!) 142 kg, SpO2 100%.  PHYSICAL EXAMINATION:  Physical Exam  GENERAL:  72 y.o.-year-old patient lying in the bed with no acute  distress.  EYES: Pupils equal, round, reactive to light and accommodation. No scleral icterus. Extraocular muscles intact.  HEENT: Head atraumatic, normocephalic. Oropharynx and nasopharynx clear.  NECK:  Supple, no jugular venous distention. No thyroid enlargement, no tenderness.  LUNGS: Normal breath sounds bilaterally, no wheezing, rales,rhonchi or crepitation. No use of accessory muscles of respiration.  CARDIOVASCULAR: Regular rate and rhythm, S1, S2 normal. No murmurs, rubs, or gallops.  ABDOMEN: Soft, nondistended, nontender. Bowel sounds present. No organomegaly or mass.  EXTREMITIES: No pedal edema, cyanosis, or clubbing.  NEUROLOGIC: Cranial nerves II through XII are intact. Muscle strength 5/5 in all extremities. Sensation intact. Gait not checked.  PSYCHIATRIC: The patient is alert and oriented x 3.  Normal affect and good eye contact. SKIN: Bilateral buttock erythema with stage II decubitus ulcers with associated erythema extending to the lower back with warmth and tenderness.  There was no palpable fluctuation. LABORATORY PANEL:   CBC Recent Labs  Lab 06/11/23 0602  WBC 19.0*  HGB 11.4*  HCT 37.1*  PLT 224   ------------------------------------------------------------------------------------------------------------------  Chemistries  Recent Labs  Lab 06/11/23 0010 06/11/23 0602  NA 136 137  K 4.8 4.5  CL 100 102  CO2 25 26  GLUCOSE 124* 129*  BUN 33* 33*  CREATININE 1.73* 1.78*  CALCIUM 8.7* 8.3*  AST 13*  --   ALT 9  --   ALKPHOS 58  --   BILITOT 0.9  --    ------------------------------------------------------------------------------------------------------------------  Cardiac Enzymes No results for input(s): "TROPONINI" in the last 168 hours. ------------------------------------------------------------------------------------------------------------------  RADIOLOGY:  DG Chest Port 1 View  Result Date: 06/11/2023 CLINICAL DATA:  Question of  sepsis to evaluate for abnormality. EXAM: PORTABLE CHEST 1 VIEW COMPARISON:  01/25/2023 FINDINGS: Cardiac enlargement. No vascular congestion, edema, or consolidation. No pleural effusions. No pneumothorax. Mediastinal contours appear intact. IMPRESSION: Cardiac enlargement.  Lungs are clear. Electronically Signed   By: Burman Nieves M.D.   On: 06/11/2023 00:27      IMPRESSION AND PLAN:  Assessment and Plan: * Sepsis due to cellulitis [facility]) - Patient will be admitted to a medical telemetry bed. - This is manifested by tachycardia and tachypnea with leukocytosis. - This related to cellulitis of the  lower back and gluteal areas. - We will continue VAC therapy with IV Rocephin and vancomycin. - Warm compresses will be utilized. - We will follow blood cultures.  Paroxysmal atrial fibrillation (HCC) - We will can continue Eliquis.  Hypothyroidism - We will continue Synthroid.  Depression - Continue Wellbutrin XL and Zoloft       DVT prophylaxis: Eliquis. Advanced Care Planning:  Code Status: full code. Family Communication:  The plan of care was discussed in details with the patient (and family). I answered all questions. The patient agreed to proceed with the above mentioned plan. Further management will depend upon hospital course. Disposition Plan: Back to previous home environment Consults called: none. All the records are reviewed and case discussed with ED provider.  Status is: Inpatient    At the time of the admission, it appears that the appropriate admission status for this patient is inpatient.  This is judged to be reasonable and necessary in order to provide the required intensity of service to ensure the patient's safety given the presenting symptoms, physical exam findings and initial radiographic and laboratory data in the context of comorbid conditions.  The patient requires inpatient status due to high intensity of service, high risk of further deterioration and  high frequency of surveillance required.  I certify that at the time of admission, it is my clinical judgment that the patient will require inpatient hospital care extending more than 2 midnights.                            Dispo: The patient is from: Home              Anticipated d/c is to: Home              Patient currently is not medically stable to d/c.              Difficult to place patient: No  Hannah Beat M.D on 06/11/2023 at 7:04 AM  [facility]   From 7 PM-7 AM, contact night-coverage [email or web address]  CC: Primary care physician; Danella Penton, MD

## 2023-06-11 NOTE — ED Provider Notes (Signed)
[facility] Provider Note    Event Date/Time   First MD Initiated Contact with Patient 06/10/23 2348     (approximate)   History   Code Sepsis   HPI  Christopher [name] is a 72 y.o. male who presents to the ED for evaluation of Code Sepsis   Review of pulmonary visit from 1 month ago.  History of chronic dyspnea, OSA and COPD, morbid obesity.  Otherwise history of HTN, HLD, hypothyroidism, CKD 3.  Apparently wheelchair-bound at baseline and about 4 months ago had a left femur fracture requiring internal fixation.  Paroxysmal A-fib on Eliquis.  Patient presents from home for evaluation of field code sepsis.  Patient reports malaise and "I am stuck on a bedsore."   Physical Exam   Triage Vital Signs: ED Triage Vitals  Encounter Vitals Group     BP 06/10/23 2346 (!) 111/45     Systolic BP Percentile --      Diastolic BP Percentile --      Pulse Rate 06/10/23 2346 (!) 113     Resp 06/10/23 2346 17     Temp 06/10/23 2348 100.1 F (37.8 C)     Temp Source 06/10/23 2348 Oral     SpO2 06/10/23 2346 94 %     Weight 06/10/23 2348 (!) 313 lb (142 kg)     Height 06/10/23 2348 6' (1.829 m)     Head Circumference --      Peak Flow --      Pain Score 06/10/23 2348 2     Pain Loc --      Pain Education --      Exclude from Growth Chart --     Most recent vital signs: Vitals:   06/10/23 2346 06/10/23 2348  BP: (!) 111/45   Pulse: (!) 113   Resp: 17   Temp:  100.1 F (37.8 C)  SpO2: 94%     General: Awake, no distress.  Morbidly obese and chronically ill-appearing, bedbound.  Conversational  CV:  Good peripheral perfusion.  Resp:  Normal effort.  Abd:  No distention.  MSK:  No deformity noted.  Neuro:  No focal deficits appreciated. Other:  With just my assistance, he is able to roll onto his left side and I can look at his lower back, has diffuse cellulitis throughout his lumbar back into his buttocks without any thing coming to ahead to suggest  abscess.    ED Results / Procedures / Treatments   Labs (all labs ordered are listed, but only abnormal results are displayed) Labs Reviewed  COMPREHENSIVE METABOLIC PANEL - Abnormal; Notable for the following components:      Result Value   Glucose, Bld 124 (*)    BUN 33 (*)    Creatinine, Ser 1.73 (*)    Calcium 8.7 (*)    AST 13 (*)    GFR, Estimated 41 (*)    All other components within normal limits  CBC WITH DIFFERENTIAL/PLATELET - Abnormal; Notable for the following components:   WBC 17.4 (*)    Hemoglobin 12.7 (*)    Neutro Abs 16.0 (*)    Lymphs Abs 0.4 (*)    Abs Immature Granulocytes 0.12 (*)    All other components within normal limits  PROTIME-INR - Abnormal; Notable for the following components:   Prothrombin Time 15.7 (*)    All other components within normal limits  CK - Abnormal; Notable for the following components:   Total CK 40 (*)  All other components within normal limits  CULTURE, BLOOD (ROUTINE X 2)  CULTURE, BLOOD (ROUTINE X 2)  LACTIC ACID, PLASMA  APTT  PROCALCITONIN  LACTIC ACID, PLASMA  URINALYSIS, W/ REFLEX TO CULTURE (INFECTION SUSPECTED)    EKG Sinus tachycardia with rate of 104 bpm.  Normal axis and intervals.  No clear signs of acute ischemia  RADIOLOGY CXR interpreted by me without evidence of acute cardiopulmonary pathology.   Official radiology report(s): DG Chest Port 1 View  Result Date: 06/11/2023 CLINICAL DATA:  Question of sepsis to evaluate for abnormality. EXAM: PORTABLE CHEST 1 VIEW COMPARISON:  01/25/2023 FINDINGS: Cardiac enlargement. No vascular congestion, edema, or consolidation. No pleural effusions. No pneumothorax. Mediastinal contours appear intact. IMPRESSION: Cardiac enlargement.  Lungs are clear. Electronically Signed   By: Burman Nieves M.D.   On: 06/11/2023 00:27    PROCEDURES and INTERVENTIONS:  .1-3 Lead EKG Interpretation  Performed by: Delton Prairie, MD Authorized by: Delton Prairie, MD      Interpretation: abnormal     ECG rate:  110   ECG rate assessment: tachycardic     Rhythm: sinus tachycardia     Ectopy: none     Conduction: normal   .Critical Care  Performed by: Delton Prairie, MD Authorized by: Delton Prairie, MD   Critical care provider statement:    Critical care time (minutes):  30   Critical care time was exclusive of:  Separately billable procedures and treating other patients   Critical care was necessary to treat or prevent imminent or life-threatening deterioration of the following conditions:  Sepsis   Critical care was time spent personally by me on the following activities:  Development of treatment plan with patient or surrogate, discussions with consultants, evaluation of patient's response to treatment, examination of patient, ordering and review of laboratory studies, ordering and review of radiographic studies, ordering and performing treatments and interventions, pulse oximetry, re-evaluation of patient's condition and review of old charts   Medications  piperacillin-tazobactam (ZOSYN) IVPB 3.375 g (has no administration in time range)  vancomycin (VANCOREADY) IVPB 2000 mg/400 mL (has no administration in time range)  sodium chloride 0.9 % bolus 1,000 mL (1,000 mLs Intravenous New Bag/Given 06/11/23 0027)     IMPRESSION / MDM / ASSESSMENT AND PLAN / ED COURSE  I reviewed the triage vital signs and the nursing notes.  Differential diagnosis includes, but is not limited to, acute renal failure, sepsis, cellulitis, abscess, UTI, rhabdomyolysis  {Patient presents with symptoms of an acute illness or injury that is potentially life-threatening.  Patient presents from home with evidence of sepsis from cellulitis and requires medical admission.  Tachycardic, borderline temperature and pressures, fluid responsive and overall stable without signs of shock.  Blood work with CKD, leukocytosis and normal lactic acid and CK.  Suspect cellulitis considering the  appearance of his lower back.  Provided broad-spectrum antibiotics after fluids.  Consult with medicine for admission  Clinical Course as of 06/11/23 0117  Sat Jun 11, 2023  0116 Reassessed.  BP responding to fluids [DS]    Clinical Course User Index [DS] Delton Prairie, MD     FINAL CLINICAL IMPRESSION(S) / ED DIAGNOSES   Final diagnoses:  Sepsis, due to unspecified organism, unspecified whether acute organ dysfunction present (HCC)  Cellulitis of back except buttock     Rx / DC Orders   ED Discharge Orders     None        Note:  This document was prepared using Dragon voice  recognition software and may include unintentional dictation errors.   Delton Prairie, MD 06/11/23 [phone]

## 2023-06-11 NOTE — Progress Notes (Signed)
Pharmacy Antibiotic Note  Christopher [name] is a 72 y.o. male admitted on 06/10/2023 with cellulitis.  Pharmacy has been consulted for Vancomycin dosing 7 days.  Plan: Pt given Vancomycin 2000 mg once. Vancomycin 1500 mg IV Q 24 hrs. Goal AUC 400-550. Expected AUC: 534 SCr used: 1.73, Vd used: 0.5, BMI: 42.4  Pharmacy will continue to follow and will adjust abx dosing whenever warranted.  Temp (24hrs), Avg:100.1 F (37.8 C), Min:100.1 F (37.8 C), Max:100.1 F (37.8 C)   Recent Labs  Lab 06/11/23 0010  WBC 17.4*  CREATININE 1.73*  LATICACIDVEN 1.2    Estimated Creatinine Clearance: 56.4 mL/min (A) (by C-G formula based on SCr of 1.73 mg/dL (H)).    Allergies  Allergen Reactions   Propranolol Other (See Comments)    Hallucinations    Albuterol Other (See Comments)    Causes afib    Gramineae Pollens Other (See Comments)    Sneeze and watery eyes Sneeze and watery eyes   Other Other (See Comments)    Other reaction(s): Other (See Comments) Unable to breath when around cats Other reaction(s): Other (See Comments) Unable to breath when around cats    Antimicrobials this admission: 10/19 Zosyn >> x  1 dose 10/19 Ceftriaxone >> x 7 days 10/19 Vancomycin >> x 7 days  Microbiology results: 10/19 BCx: Pending  Thank you for allowing pharmacy to be a part of this patient's care.  Otelia Sergeant, PharmD, [facility] 06/11/2023 2:44 AM

## 2023-06-11 NOTE — ED Notes (Signed)
Son at bedside.

## 2023-06-12 DIAGNOSIS — L0390 Cellulitis, unspecified: Secondary | ICD-10-CM | POA: Diagnosis not present

## 2023-06-12 DIAGNOSIS — A419 Sepsis, unspecified organism: Secondary | ICD-10-CM | POA: Diagnosis not present

## 2023-06-12 LAB — CBC
HCT: 34.3 % — ABNORMAL LOW (ref 39.0–52.0)
Hemoglobin: 10.2 g/dL — ABNORMAL LOW (ref 13.0–17.0)
MCH: 26 pg (ref 26.0–34.0)
MCHC: 29.7 g/dL — ABNORMAL LOW (ref 30.0–36.0)
MCV: 87.3 fL (ref 80.0–100.0)
Platelets: 194 10*3/uL (ref 150–400)
RBC: 3.93 MIL/uL — ABNORMAL LOW (ref 4.22–5.81)
RDW: 15.2 % (ref 11.5–15.5)
WBC: 9.7 10*3/uL (ref 4.0–10.5)
nRBC: 0 % (ref 0.0–0.2)

## 2023-06-12 LAB — BASIC METABOLIC PANEL
Anion gap: 12 (ref 5–15)
BUN: 24 mg/dL — ABNORMAL HIGH (ref 8–23)
CO2: 20 mmol/L — ABNORMAL LOW (ref 22–32)
Calcium: 8.1 mg/dL — ABNORMAL LOW (ref 8.9–10.3)
Chloride: 106 mmol/L (ref 98–111)
Creatinine, Ser: 1.33 mg/dL — ABNORMAL HIGH (ref 0.61–1.24)
GFR, Estimated: 57 mL/min — ABNORMAL LOW (ref >60)
Glucose, Bld: 75 mg/dL (ref 70–99)
Potassium: 4.2 mmol/L (ref 3.5–5.1)
Sodium: 138 mmol/L (ref 135–145)

## 2023-06-12 MED ORDER — APIXABAN 5 MG PO TABS: 5.0000 mg | ORAL_TABLET | Freq: Two times a day (BID) | ORAL | 0 refills | Status: DC

## 2023-06-12 MED ORDER — MIDODRINE HCL 5 MG PO TABS
10.0000 mg | ORAL_TABLET | Freq: Three times a day (TID) | ORAL | Status: DC
  Administered 2023-06-12 (×2): 10 mg via ORAL
  Filled 2023-06-12 (×2): qty 2

## 2023-06-12 MED ORDER — SULFAMETHOXAZOLE-TRIMETHOPRIM 800-160 MG PO TABS: 1.0000 | ORAL_TABLET | Freq: Two times a day (BID) | ORAL | 0 refills | Status: AC

## 2023-06-12 MED ORDER — POTASSIUM CHLORIDE CRYS ER 20 MEQ PO TBCR: 20.0000 meq | EXTENDED_RELEASE_TABLET | Freq: Every day | ORAL | Status: DC

## 2023-06-12 MED ORDER — MIDODRINE HCL 10 MG PO TABS: 10.0000 mg | ORAL_TABLET | Freq: Three times a day (TID) | ORAL | Status: DC

## 2023-06-12 MED ORDER — HYDROCODONE-ACETAMINOPHEN 5-325 MG PO TABS: 1.0000 | ORAL_TABLET | Freq: Four times a day (QID) | ORAL | 0 refills | Status: DC | PRN

## 2023-06-12 MED ORDER — TORSEMIDE 20 MG PO TABS: 20.0000 mg | ORAL_TABLET | Freq: Every day | ORAL | Status: AC | PRN

## 2023-06-12 NOTE — Discharge Summary (Signed)
Physician Discharge Summary   Patient: Christopher [name] MRN: [id number]  DOB: 19-Jan-1951   Admit:     Date of Admission: 06/10/2023 Admitted from: home   Discharge: Date of discharge: 06/12/23 Disposition: Home Condition at discharge: good  CODE STATUS: FULL CODE     Discharge Physician: Sunnie Nielsen, DO [facility]     PCP: Danella Penton, MD  Recommendations for Outpatient Follow-up:  Follow up with PCP Danella Penton, MD in 1-2 weeks Please obtain labs/tests: CBC, BMP in 1-2 May benefit from wound care wither at clinic or [facility] RN May benefit from PT/OT Please follow up on the following pending results: final blood cultures PCP AND OTHER OUTPATIENT PROVIDERS: SEE BELOW FOR SPECIFIC DISCHARGE INSTRUCTIONS PRINTED FOR PATIENT IN ADDITION TO GENERIC AVS PATIENT INFO     Discharge Instructions     Diet - low sodium heart healthy   Complete by: As directed    Discharge instructions   Complete by: As directed    NEED TO FOLLOW W/ PCP TO SET UP HOME HEALTH, OUR CASE MANAGER ATTEMPTED HERE TO ARRANGE HOME HEALTH RN FOR WOUND CARE, PHYSICAL THERAPY AND OCCUPATIONAL THERAPY BUT WE WERE NOT ABLE TO GET A COMPANY TO ACCEPT.   Increase activity slowly   Complete by: As directed          Discharge Diagnoses: Principal Problem:   Sepsis due to cellulitis [facility]) Active Problems:   Paroxysmal atrial fibrillation (HCC)   Sleep apnea   Morbid obesity with BMI of 45.0-49.9, adult (HCC)   Hyperlipidemia   Depression   Hypothyroidism   Chronic diastolic CHF (congestive heart failure) (HCC)   Stage 3b chronic kidney disease (CKD) (HCC)   Benign essential hypertension       Hospital Course: HPI: Christopher [name] is a 72 y.o. Caucasian male with medical history significant for CHF, COPD, hypertension and dyslipidemia as well as hypothyroidism,who presented to the emergency room 06/10/23 from home via EMS with acute onset of low back and gluteal mild pain with  associated gradual worsening gluteal area redness, warmth and tenderness with erythema which is been developing over the last couple weeks.   Hospital course / significant events:  10/18: to ED late evening 10/19: admitted to hospitalist service for sepsis d/t cellulitis, AKI. BP low, continuing fluids, relative hypotension seems to be baseline on review of outpatient records  10/20: AKI resolved, WBC normal, pain controlled. BP improved, had not been getting his midodrine which he takes tid at home.   Consultants:  none  Procedures/Surgeries: none      ASSESSMENT & PLAN:   Sepsis due to cellulitis - sepsis resolved Suspect true sepsis unlikely given only brief tachycardia and given that tachypnea seems baseline w/ OHS IV Rocephin and vancomycin x2 days --> po bactrim to cover potential MRSA Warm compresses  follow blood cultures but expect these will stay negative. Follow w/ PCP - we were unable to arrange home health for wound care and for PT/OT   Chronic diastolic CHF not in exacerbation  Paroxysmal atrial fibrillation HTN w/ hx hypotension  HLD CAD continue Eliquis 5 mg bid (on 2.5 mg at home, pharmacy recs for 5 mg) ASA, statin, midodrine Family states he takes Torsemide for edema  Dependent edema He does not keep legs elevated or consistently use compression  Advised leg elevation and gentle compression as much as possible Torsemide only as needed    AKI on CKD3b, resolved Monitor BMP Torsemide as needed rather  than daily, follow w/ PCP   Eosinophilic asthma  Bronchodilators prn  Home Flovent   Hypothyroidism continue Synthroid.   Depression continue Wellbutrin XL and Zoloft   OSA/OHS CPAP recommended, pt has poor adherence     Morbid obesity based on BMI: Body mass index is 42.45 kg/m.  Underweight - under 18.5  normal weight - 18.5 to 24.9 overweight - 25 to 29.9 obese - 30 or more             Discharge Instructions  Allergies as  of 06/12/2023       Reactions   Propranolol Other (See Comments)   Hallucinations   Albuterol Other (See Comments)   Causes afib    Gramineae Pollens Other (See Comments)   Sneeze and watery eyes Sneeze and watery eyes   Other Other (See Comments)   Other reaction(s): Other (See Comments) Unable to breath when around cats Other reaction(s): Other (See Comments) Unable to breath when around cats        Medication List     TAKE these medications    acetaminophen 325 MG tablet Commonly known as: TYLENOL Take 2 tablets (650 mg total) by mouth every 6 (six) hours as needed for mild pain, fever, headache or moderate pain.   alum & mag hydroxide-simeth 200-200-20 MG/5ML suspension Commonly known as: MAALOX/MYLANTA Take 15 mLs by mouth every 6 (six) hours as needed for indigestion or heartburn.   apixaban 5 MG Tabs tablet Commonly known as: ELIQUIS Take 1 tablet (5 mg total) by mouth 2 (two) times daily. What changed:  medication strength how much to take   aspirin EC 81 MG tablet Take 81 mg by mouth at bedtime.   atorvastatin 20 MG tablet Commonly known as: LIPITOR Take 20 mg by mouth at bedtime.   bisacodyl 10 MG suppository Commonly known as: DULCOLAX Place 1 suppository (10 mg total) rectally daily as needed for moderate constipation.   buPROPion 150 MG 12 hr tablet Commonly known as: WELLBUTRIN SR Take 150 mg by mouth 2 (two) times daily. After 1st meal of day and after dinner   Cholecalciferol 25 MCG (1000 UT) capsule Take 1,000 Units by mouth at bedtime.   cyanocobalamin 1000 MCG tablet Take 1,000 mcg by mouth 2 (two) times a week. At bedtime. Sundays and Wednesdays   Flovent HFA 110 MCG/ACT inhaler Generic drug: fluticasone Inhale 1 puff into the lungs 2 (two) times daily.   HYDROcodone-acetaminophen 5-325 MG tablet Commonly known as: NORCO/VICODIN Take 1-2 tablets by mouth every 6 (six) hours as needed for moderate pain (pain score 4-6) or severe  pain (pain score 7-10).   ipratropium 0.06 % nasal spray Commonly known as: ATROVENT Place 2 sprays into both nostrils 3 (three) times daily as needed for rhinitis.   levalbuterol 45 MCG/ACT inhaler Commonly known as: XOPENEX HFA Inhale 2 puffs into the lungs every 4 (four) hours as needed for wheezing.   levothyroxine 88 MCG tablet Commonly known as: SYNTHROID Take 88 mcg by mouth daily. 30 to 60 minutes before breakfast on an empty stomach and with a glass of water   loratadine 10 MG tablet Commonly known as: CLARITIN Take 10 mg by mouth daily.   Magnesium 250 MG Tabs Take 1 tablet by mouth daily.   methocarbamol 500 MG tablet Commonly known as: ROBAXIN Take 1 tablet (500 mg total) by mouth every 6 (six) hours as needed for muscle spasms.   midodrine 10 MG tablet Commonly known as: PROAMATINE Take 1  tablet (10 mg total) by mouth 3 (three) times daily. What changed:  when to take this reasons to take this   ondansetron 4 MG tablet Commonly known as: ZOFRAN Take 1 tablet (4 mg total) by mouth every 6 (six) hours as needed for nausea.   polyethylene glycol 17 g packet Commonly known as: MIRALAX / GLYCOLAX Take 17 g by mouth 2 (two) times daily.   potassium chloride SA 20 MEQ tablet Commonly known as: KLOR-CON M Take 1 tablet (20 mEq total) by mouth daily. When taking torsemide What changed: additional instructions   PreserVision AREDS 2 Caps Take 1 capsule by mouth 2 (two) times daily. With food. After 1st meal of day and after dinner   senna-docusate 8.6-50 MG tablet Commonly known as: Senokot-S Take 1 tablet by mouth 2 (two) times daily.   sertraline 50 MG tablet Commonly known as: ZOLOFT Take 50 mg by mouth daily.   sulfamethoxazole-trimethoprim 800-160 MG tablet Commonly known as: BACTRIM DS Take 1 tablet by mouth 2 (two) times daily for 5 days. Start taking on: June 13, 2023   torsemide 20 MG tablet Commonly known as: DEMADEX Take 1 tablet (20 mg  total) by mouth daily as needed (severe edema). What changed:  when to take this reasons to take this   trolamine salicylate 10 % cream Commonly known as: ASPERCREME Apply 1 application topically as needed for muscle pain. Apply to knees and feet   vitamin A & D ointment Apply 1 application topically as needed. To groin area.          Allergies  Allergen Reactions   Propranolol Other (See Comments)    Hallucinations    Albuterol Other (See Comments)    Causes afib    Gramineae Pollens Other (See Comments)    Sneeze and watery eyes Sneeze and watery eyes   Other Other (See Comments)    Other reaction(s): Other (See Comments) Unable to breath when around cats Other reaction(s): Other (See Comments) Unable to breath when around cats     Subjective: pt reports minimal pain at this time, no fever, feeling better today    Discharge Exam: BP 116/62   Pulse 71   Temp 97.7 F (36.5 C) (Oral)   Resp 18   Ht 6' (1.829 m)   Wt (!) 142 kg   SpO2 100%   BMI 42.45 kg/m  General: Pt is alert, awake, not in acute distress Cardiovascular: RRR, S1/S2 +, no rubs, no gallops Respiratory: CTA bilaterally, no wheezing, no rhonchi Abdominal: Soft, NT, ND, bowel sounds Skin: minimal erythema around buttocks/sacrum,  Extremities: no edema, no cyanosis     The results of significant diagnostics from this hospitalization (including imaging, microbiology, ancillary and laboratory) are listed below for reference.     Microbiology: Recent Results (from the past 240 hour(s))  Blood Culture (routine x 2)     Status: None (Preliminary result)   Collection Time: 06/11/23 12:10 AM   Specimen: BLOOD  Result Value Ref Range Status   Specimen Description BLOOD BLOOD RIGHT HAND  Final   Special Requests   Final    BOTTLES DRAWN AEROBIC AND ANAEROBIC Blood Culture adequate volume   Culture   Final    NO GROWTH 1 DAY Performed at [facility], [address],  [place], [place] [address]    Report Status PENDING  Incomplete  Blood Culture (routine x 2)     Status: None (Preliminary result)   Collection Time: 06/11/23 12:15  AM   Specimen: BLOOD  Result Value Ref Range Status   Specimen Description BLOOD BLOOD LEFT WRIST  Final   Special Requests   Final    BOTTLES DRAWN AEROBIC AND ANAEROBIC Blood Culture adequate volume   Culture   Final    NO GROWTH 1 DAY Performed at [facility], [address] [place], [place] [address]    Report Status PENDING  Incomplete     Labs: BNP (last 3 results) Recent Labs    01/19/23 1652  BNP 72.1   Basic Metabolic Panel: Recent Labs  Lab 06/11/23 0010 06/11/23 0602 06/12/23 0610  NA 136 137 138  K 4.8 4.5 4.2  CL 100 102 106  CO2 25 26 20*  GLUCOSE 124* 129* 75  BUN 33* 33* 24*  CREATININE 1.73* 1.78* 1.33*  CALCIUM 8.7* 8.3* 8.1*   Liver Function Tests: Recent Labs  Lab 06/11/23 0010  AST 13*  ALT 9  ALKPHOS 58  BILITOT 0.9  PROT 6.8  ALBUMIN 3.6   No results for input(s): "LIPASE", "AMYLASE" in the last 168 hours. No results for input(s): "AMMONIA" in the last 168 hours. CBC: Recent Labs  Lab 06/11/23 0010 06/11/23 0602 06/12/23 0610  WBC 17.4* 19.0* 9.7  NEUTROABS 16.0*  --   --   HGB 12.7* 11.4* 10.2*  HCT 40.9 37.1* 34.3*  MCV 85.0 86.1 87.3  PLT 249 224 194   Cardiac Enzymes: Recent Labs  Lab 06/11/23 0010  CKTOTAL 40*   BNP: Invalid input(s): "POCBNP" CBG: No results for input(s): "GLUCAP" in the last 168 hours. D-Dimer No results for input(s): "DDIMER" in the last 72 hours. Hgb A1c No results for input(s): "HGBA1C" in the last 72 hours. Lipid Profile No results for input(s): "CHOL", "HDL", "LDLCALC", "TRIG", "CHOLHDL", "LDLDIRECT" in the last 72 hours. Thyroid function studies No results for input(s): "TSH", "T4TOTAL", "T3FREE", "THYROIDAB" in the last 72 hours.  Invalid input(s): "FREET3" Anemia work up No results for input(s): "VITAMINB12",  "FOLATE", "FERRITIN", "TIBC", "IRON", "RETICCTPCT" in the last 72 hours. Urinalysis    Component Value Date/Time   COLORURINE YELLOW (A) 06/11/2023 0613   APPEARANCEUR CLEAR (A) 06/11/2023 0613   LABSPEC 1.023 06/11/2023 0613   PHURINE 5.0 06/11/2023 0613   GLUCOSEU NEGATIVE 06/11/2023 0613   HGBUR NEGATIVE 06/11/2023 0613   BILIRUBINUR NEGATIVE 06/11/2023 0613   KETONESUR NEGATIVE 06/11/2023 0613   PROTEINUR NEGATIVE 06/11/2023 0613   NITRITE NEGATIVE 06/11/2023 0613   LEUKOCYTESUR NEGATIVE 06/11/2023 0613   Sepsis Labs Recent Labs  Lab 06/11/23 0010 06/11/23 0602 06/12/23 0610  WBC 17.4* 19.0* 9.7   Microbiology Recent Results (from the past 240 hour(s))  Blood Culture (routine x 2)     Status: None (Preliminary result)   Collection Time: 06/11/23 12:10 AM   Specimen: BLOOD  Result Value Ref Range Status   Specimen Description BLOOD BLOOD RIGHT HAND  Final   Special Requests   Final    BOTTLES DRAWN AEROBIC AND ANAEROBIC Blood Culture adequate volume   Culture   Final    NO GROWTH 1 DAY Performed at [facility], [address] [place], [place] [address]    Report Status PENDING  Incomplete  Blood Culture (routine x 2)     Status: None (Preliminary result)   Collection Time: 06/11/23 12:15 AM   Specimen: BLOOD  Result Value Ref Range Status   Specimen Description BLOOD BLOOD LEFT WRIST  Final   Special Requests   Final  BOTTLES DRAWN AEROBIC AND ANAEROBIC Blood Culture adequate volume   Culture   Final    NO GROWTH 1 DAY Performed at [facility], [address] [place], [place] [address]    Report Status PENDING  Incomplete   Imaging DG Chest Port 1 View  Result Date: 06/11/2023 CLINICAL DATA:  Question of sepsis to evaluate for abnormality. EXAM: PORTABLE CHEST 1 VIEW COMPARISON:  01/25/2023 FINDINGS: Cardiac enlargement. No vascular congestion, edema, or consolidation. No pleural effusions. No pneumothorax. Mediastinal contours  appear intact. IMPRESSION: Cardiac enlargement.  Lungs are clear. Electronically Signed   By: Burman Nieves M.D.   On: 06/11/2023 00:27      Time coordinating discharge: over 30 minutes  SIGNED:  Sunnie Nielsen DO [facility]

## 2023-06-12 NOTE — TOC Initial Note (Signed)
Transition of Care [facility]) - Initial/Assessment Note    Patient Details  Name: Christopher [name] MRN: [id number] Date of Birth: 08-26-1950  Transition of Care [facility]) CM/SW Contact:    Colette Ribas, LCSWA Phone Number: 06/12/2023, 3:58 PM  Clinical Narrative:                  CSW received a consult from MD regarding HH.CSW met with patientand family bedside to confirm any preference in [facility]. They advised he has used [facility] in the past. PER MD he needs HH for Wound/PT/OT CSW reached out to [facility]-They denied, [facility]-No response, [facility]-Declined. [facility]: No response. Relayed to MD she advised the patient can follow-up with his PCP.       Patient Goals and CMS Choice            Expected Discharge Plan and Services                                              Prior Living Arrangements/Services                       Activities of Daily Living      Permission Sought/Granted                  Emotional Assessment              Admission diagnosis:  Sepsis due to cellulitis (HCC) [L03.90, A41.9] Patient Active Problem List   Diagnosis Date Noted   Sepsis due to cellulitis (HCC) 06/11/2023   Paroxysmal atrial fibrillation (HCC) 06/11/2023   Closed left subtrochanteric femur fracture, initial encounter (HCC) 01/19/2023   CKD (chronic kidney disease) stage 4, GFR 15-29 ml/min (HCC) 01/26/2022   Generalized weakness 10/16/2021   Chronic respiratory failure with hypoxia (HCC) 10/16/2021   Hypotension    SIRS (systemic inflammatory response syndrome) (HCC)    AF (paroxysmal atrial fibrillation) (HCC)    Syncope    Severe sepsis (HCC) 05/20/2021   Pressure injury of skin 04/25/2021   Acute respiratory failure (HCC) 04/21/2021   Acute kidney injury superimposed on CKD (HCC) 04/21/2021   Stage 3b chronic kidney disease (CKD) (HCC) 03/22/2021   Leukocytosis 03/22/2021   Osteomyelitis of ankle or foot, acute, left (HCC) 03/17/2021    Acute on chronic respiratory failure (HCC) 03/17/2021   Osteomyelitis (HCC) 12/16/2020   Depression 12/16/2020   Medicare annual wellness visit, initial 11/14/2019   B12 deficiency 11/14/2019   Hyperlipidemia 10/31/2019   Primary osteoarthritis of right knee 03/16/2019   Sleep apnea 10/31/2018   Traumatic incomplete tear of left rotator cuff 10/02/2018   Morbid obesity with BMI of 45.0-49.9, adult (HCC) 09/20/2018   Hypothyroidism 09/20/2018   Chronic diastolic CHF (congestive heart failure) (HCC) 09/20/2018   Benign essential hypertension 09/20/2018   PCP:  Danella Penton, MD Pharmacy:   [id number] [place], [place] - [address] AT [facility] [address] [place] [address] Phone: [phone] Fax: [phone]     Social Determinants of Health (SDOH) Social History: SDOH Screenings   Food Insecurity: No Food Insecurity (01/19/2023)  Housing: Low Risk  (01/19/2023)  Transportation Needs: No Transportation Needs (01/19/2023)  Utilities: Not At Risk (01/19/2023)  Depression (PHQ2-9): Low Risk  (10/20/2022)  Tobacco Use: Low Risk  (06/10/2023)  SDOH Interventions:     Readmission Risk Interventions    04/26/2021   10:16 AM  Readmission Risk Prevention Plan  Transportation Screening Complete  PCP or Specialist Appt within 3-5 Days Complete  HRI or Home Care Consult Complete  Social Work Consult for Recovery Care Planning/Counseling Complete  Palliative Care Screening Not Applicable  Medication Review Oceanographer) Complete

## 2023-06-13 ENCOUNTER — Ambulatory Visit: Payer: Medicare Other | Admitting: Physical Therapy

## 2023-06-15 ENCOUNTER — Ambulatory Visit: Payer: Medicare Other | Admitting: Physical Therapy

## 2023-06-16 LAB — CULTURE, BLOOD (ROUTINE X 2)
Culture: NO GROWTH
Culture: NO GROWTH
Special Requests: ADEQUATE
Special Requests: ADEQUATE

## 2023-06-23 ENCOUNTER — Ambulatory Visit: Payer: Medicare Other | Admitting: Physical Therapy

## 2023-06-23 DIAGNOSIS — R2689 Other abnormalities of gait and mobility: Secondary | ICD-10-CM | POA: Diagnosis present

## 2023-06-23 DIAGNOSIS — M6281 Muscle weakness (generalized): Secondary | ICD-10-CM

## 2023-06-23 DIAGNOSIS — R269 Unspecified abnormalities of gait and mobility: Secondary | ICD-10-CM

## 2023-06-23 DIAGNOSIS — R262 Difficulty in walking, not elsewhere classified: Secondary | ICD-10-CM

## 2023-06-23 DIAGNOSIS — R2681 Unsteadiness on feet: Secondary | ICD-10-CM

## 2023-06-23 NOTE — Therapy (Signed)
OUTPATIENT PHYSICAL THERAPY NEURO TREATMENT   Patient Name: Christopher [name] MRN: [id number] DOB:[birth year], 72 y.o., male Today's Date: 06/23/2023   PCP: Danella Penton, MD  REFERRING PROVIDER: Roby Lofts, MD    END OF SESSION:  PT End of Session - 06/23/23 0930     Visit Number 5    Number of Visits 24    Date for PT Re-Evaluation 08/15/23    Authorization Type [employer] Medicare    Progress Note Due on Visit 10    PT Start Time 0930    PT Stop Time 1012    PT Time Calculation (min) 42 min    Equipment Utilized During Treatment Gait belt    Activity Tolerance Patient tolerated treatment well    Behavior During Therapy WFL for tasks assessed/performed                Past Medical History:  Diagnosis Date   Anxiety    CHF (congestive heart failure) (HCC)    COPD (chronic obstructive pulmonary disease) (HCC)    Depression    Dysrhythmia    Hyperlipidemia    Hypertension    Hypothyroidism    Sleep apnea    Past Surgical History:  Procedure Laterality Date   FRACTURE SURGERY     ORIF ANKLE FRACTURE Left 12/17/2020   Procedure: OPEN REDUCTION INTERNAL FIXATION (ORIF) ANKLE FRACTURE;  Surgeon: Gwyneth Revels, DPM;  Location: [facility] ORS;  Service: Podiatry;  Laterality: Left;   ORIF FEMUR FRACTURE Left 01/20/2023   Procedure: OPEN REDUCTION INTERNAL FIXATION (ORIF) DISTAL FEMUR FRACTURE;  Surgeon: Roby Lofts, MD;  Location: [facility] OR;  Service: Orthopedics;  Laterality: Left;   Patient Active Problem List   Diagnosis Date Noted   Sepsis due to cellulitis (HCC) 06/11/2023   Paroxysmal atrial fibrillation (HCC) 06/11/2023   Closed left subtrochanteric femur fracture, initial encounter (HCC) 01/19/2023   CKD (chronic kidney disease) stage 4, GFR 15-29 ml/min (HCC) 01/26/2022   Generalized weakness 10/16/2021   Chronic respiratory failure with hypoxia (HCC) 10/16/2021   Hypotension    SIRS (systemic inflammatory response syndrome) (HCC)    AF (paroxysmal atrial  fibrillation) (HCC)    Syncope    Severe sepsis (HCC) 05/20/2021   Pressure injury of skin 04/25/2021   Acute respiratory failure (HCC) 04/21/2021   Acute kidney injury superimposed on CKD (HCC) 04/21/2021   Stage 3b chronic kidney disease (CKD) (HCC) 03/22/2021   Leukocytosis 03/22/2021   Osteomyelitis of ankle or foot, acute, left (HCC) 03/17/2021   Acute on chronic respiratory failure (HCC) 03/17/2021   Osteomyelitis (HCC) 12/16/2020   Depression 12/16/2020   Medicare annual wellness visit, initial 11/14/2019   B12 deficiency 11/14/2019   Hyperlipidemia 10/31/2019   Primary osteoarthritis of right knee 03/16/2019   Sleep apnea 10/31/2018   Traumatic incomplete tear of left rotator cuff 10/02/2018   Morbid obesity with BMI of 45.0-49.9, adult (HCC) 09/20/2018   Hypothyroidism 09/20/2018   Chronic diastolic CHF (congestive heart failure) (HCC) 09/20/2018   Benign essential hypertension 09/20/2018    ONSET DATE: 01/19/23  REFERRING DIAG: W09.811B (ICD-10-CM) - Closed comminuted supracondylar fracture of left femur (HCC)   THERAPY DIAG:  Difficulty in walking, not elsewhere classified  Muscle weakness (generalized)  Unsteadiness on feet  Other abnormalities of gait and mobility  Abnormality of gait and mobility  Rationale for Evaluation and Treatment: Rehabilitation  SUBJECTIVE:  SUBJECTIVE STATEMENT:  Pt reports he has been feeling better since hospital admission. Pt reports no falls or LOB since last session. Pt son and DTR present for appointment.   Pt accompanied by: family member  PERTINENT HISTORY: ORIF of left supracondylar distal femur on 01/20/23. Went to SNF on 01/25/23 for rehab. Pt is here for femoral fracture. When pt was stepping out of the shower his leg gave out and he fell  onto the floor. Pt is working on getting the shower changed to make it a simple walk in shower. Pt  reports most of his falls are in the bathroom. Pt has a bar in the shower but is unable to have a bar on the shower itself due to the set up. Pt is generally inactive and does not walk much around the house, relies on the wheelchair for mobility. Pt wants to get out of the wheel chair for mobility, at least a little bit. When in rehab over the summer he was walking about 20-25 ft at a time.    PAIN:  Are you having pain? Yes: Pain location: left knee Aggravating factors: certain movements   PRECAUTIONS: Fall  RED FLAGS: None   WEIGHT BEARING RESTRICTIONS: No  FALLS: Has patient fallen in last 6 months? Yes. Number of falls 1  LIVING ENVIRONMENT: Lives with: lives with their family Lives in: House/apartment Stairs: Yes: Internal: 1 steps; none and External: 2 steps; can reach both Has following equipment at home: Dan Humphreys - 2 wheeled, Environmental consultant - 4 wheeled, Wheelchair (manual), shower chair, Grab bars, and grabber to pick things up  PLOF: Requires assistive device for independence, Needs assistance with ADLs, Needs assistance with homemaking, Needs assistance with gait, and Needs assistance with transfers  PATIENT GOALS: Improve his mobility.   OBJECTIVE:   DIAGNOSTIC FINDINGS:from 01/20/23:  " IMPRESSION: ORIF of comminuted distal femur fracture with improved fracture alignment from preoperative imaging"  COGNITION: Overall cognitive status: Within functional limits for tasks assessed   SENSATION: Not tested  COORDINATION: Not tested   EDEMA:  Not measured but B LE swelling seemed to be likely based on pt presentation    LOWER EXTREMITY ROM:     Active  Right Eval Left Eval  Hip flexion    Hip extension    Hip abduction    Hip adduction    Hip internal rotation    Hip external rotation    Knee flexion  95*  Knee extension Unable to achieve full extension in seated   Unable to achieve full extension in seated   Ankle dorsiflexion    Ankle plantarflexion    Ankle inversion    Ankle eversion     (Blank rows = not tested) *indicated measured in seated in WC and could not accurately ID bony landmarks.   LOWER EXTREMITY MMT:    MMT Right Eval Left Eval  Hip flexion 4- 3+  Hip extension    Hip abduction  4-*  Hip adduction  4-  Hip internal rotation 4 3+  Hip external rotation 4 3  Knee flexion 4 4-  Knee extension 4 4-  Ankle dorsiflexion    Ankle plantarflexion    Ankle inversion    Ankle eversion    (Blank rows = not tested)    TRANSFERS: Assistive device utilized: Environmental consultant - 2 wheeled  Sit to stand: Min A and DTR holds walker, pt uses heavy UE assist  Stand to sit: CGA Chair to chair: CGA and uses walker, close guarding  Floor:  unable  RAMP:   Ramp Comments: uses manual WC propulsion  CURB:   Curb Comments: uses manual WC propulsion  STAIRS:   Comments: not tested at eval, recomment testing at another time.   GAIT: Gait pattern:  left LE in hip ER throughout to make up for lack of ankle motion and strength. Heavy UE use on bari 2WW  Distance walked: 20 ft  Assistive device utilized: Environmental consultant - 2 wheeled and bariatric walker  Level of assistance: CGA and WC follow  Comments: decreased step length  FUNCTIONAL TESTS:  5 times sit to stand: 29 sec with heavy UE assist and walker Timed up and go (TUG): 88 sec walker 10 meter walk test: unable  PATIENT SURVEYS:  FOTO 28 RAG:49  TODAY'S TREATMENT 06/23/23:                                                                                                                          Therex   Nustep level 2 x 5 minutes with B UE/LE  LAQ 2 x 12 with 2# AW   Seated march 2 x 12 with 2# AW   Heel raises 2 x 12 with 2# AW  Seated adductor squeeze 2 x 10 reps with bolster between   Seated step over 2 x 10 ea LE ( hip abd/ IR then hip ADD /  ER) focus on foot positioning each rep  with 2# AW   Hip adduction ball squeeze 2 x 12 with rainbow kickball   L ankle 3 way ( DF, EV, INV) 2 x 15 ea   STS from standard WC then ambulated with BRW to nustel   Nustep LE and UE reciprocal movement x 6 min   STS then ambulates to mat table  Raised mat table height to improve PT ability to perform  STS with Min UE assist   -STS from raised mat x 5 reps with min UE assist on BRW  STS then ambulates with BRW x 10 ft to WC to end session.   Unless otherwise stated, CGA was provided and gait belt donned in order to ensure pt safety   PATIENT EDUCATION: Education details: POC Person educated: Patient Education method: Explanation Education comprehension: verbalized understanding   HOME EXERCISE PROGRAM: Access Code: [email or web address] URL: [email or web address] Date: 05/30/2023 Prepared by: Thresa Ross  Exercises - Seated Long Arc Quad  - 1 x daily - 7 x weekly - 2 sets - 10 reps - 3 sec  hold - Seated March  - 1 x daily - 7 x weekly - 2 sets - 10 reps - Seated single leg step to side   - 1 x daily - 7 x weekly - 2 sets - 10 reps - Seated Heel Raise  - 1 x daily - 7 x weekly - 2 sets - 15 reps   GOALS: Goals reviewed with patient? Yes  SHORT TERM GOALS: Target date: 06/20/2023    Patient will be independent  in home exercise program to improve strength/mobility for better functional independence with ADLs. Baseline: No HEP currently  Goal status: INITIAL   LONG TERM GOALS: Target date: 08/15/2023   1.  Patient (> 13 years old) will complete five times sit to stand test in <20 seconds and with UE support only on chair ( can use walker in standing for balance but cannot pull on walker) indicating an increased LE strength and improved balance. Baseline: 29 sec with heavy UE assist and walker Goal status: INITIAL  2.  Patient will increase FOTO score to equal to or greater than  49   to demonstrate statistically significant improvement in mobility and  quality of life.  Baseline: 28 Goal status: INITIAL    3.  Patient will reduce timed up and go to <11 seconds to reduce fall risk and demonstrate improved transfer/gait ability. Baseline: 88 sec with walker and with Heavy UE use to come to standing position  Goal status: INITIAL  4.   Patient will increase 10 meter walk test to > .5 m/s as to improve gait speed for better community ambulation and to reduce fall risk. Baseline: Unable to ambulate this distance on eval  Goal status: INITIAL    ASSESSMENT:  CLINICAL IMPRESSION:    Pt presents with good motivation for completion of PT activities and seems to be in good spirits. Session focused on seated LE strengthening, use of Nustep for improve LE endurance, and STS with less UE assist to improve his LE strength and functional transfer ability. Will likely need progressive weightbearing activities mixed with un weighted therex to accommodate for discomfort felt in knee. Pt will continue to benefit from skilled physical therapy intervention to address impairments, improve QOL, and attain therapy goals.   OBJECTIVE IMPAIRMENTS: Abnormal gait, decreased activity tolerance, decreased balance, decreased endurance, decreased mobility, difficulty walking, decreased ROM, decreased strength, obesity, and pain.   ACTIVITY LIMITATIONS: carrying, lifting, bending, standing, squatting, stairs, transfers, bed mobility, bathing, toileting, dressing, and locomotion level  PARTICIPATION LIMITATIONS: driving, shopping, community activity, and yard work  PERSONAL FACTORS: Age, Behavior pattern, Fitness, Past/current experiences, Time since onset of injury/illness/exacerbation, and 3+ comorbidities: CHF, COPD, HLD, HTN  are also affecting patient's functional outcome.   REHAB POTENTIAL: Fair previous poor compliance with HEP and with therapy visits.   CLINICAL DECISION MAKING: Stable/uncomplicated  EVALUATION COMPLEXITY: Low  PLAN:  PT FREQUENCY:  2x/week  PT DURATION: 12 weeks  PLANNED INTERVENTIONS: Therapeutic exercises, Therapeutic activity, Neuromuscular re-education, Balance training, Gait training, Patient/Family education, Self Care, Joint mobilization, Stair training, and Prosthetic training  PLAN FOR NEXT SESSION: seated or supine therex mixed with closed chain strengthening as pt knee pain allows  Norman Herrlich, PT, DPT 06/23/2023, 9:30 AM

## 2023-07-04 ENCOUNTER — Ambulatory Visit: Payer: Medicare Other | Admitting: Physical Therapy

## 2023-07-06 ENCOUNTER — Ambulatory Visit: Payer: Medicare Other | Admitting: Physical Therapy

## 2023-07-06 ENCOUNTER — Ambulatory Visit: Payer: Medicare Other | Attending: Student | Admitting: Physical Therapy

## 2023-07-06 DIAGNOSIS — R262 Difficulty in walking, not elsewhere classified: Secondary | ICD-10-CM | POA: Diagnosis present

## 2023-07-06 DIAGNOSIS — R269 Unspecified abnormalities of gait and mobility: Secondary | ICD-10-CM | POA: Diagnosis present

## 2023-07-06 DIAGNOSIS — M6281 Muscle weakness (generalized): Secondary | ICD-10-CM | POA: Diagnosis present

## 2023-07-06 DIAGNOSIS — R2681 Unsteadiness on feet: Secondary | ICD-10-CM | POA: Diagnosis present

## 2023-07-06 DIAGNOSIS — R2689 Other abnormalities of gait and mobility: Secondary | ICD-10-CM | POA: Diagnosis present

## 2023-07-06 NOTE — Therapy (Signed)
OUTPATIENT PHYSICAL THERAPY NEURO TREATMENT   Patient Name: Christopher [name] MRN: [id number] DOB:1951-08-17, 72 y.o., male Today's Date: 07/06/2023   PCP: Danella Penton, MD  REFERRING PROVIDER: Roby Lofts, MD    END OF SESSION:  PT End of Session - 07/06/23 1102     Visit Number 6    Number of Visits 24    Date for PT Re-Evaluation 08/15/23    Authorization Type [employer]    Progress Note Due on Visit 10    PT Start Time 1102    PT Stop Time 1144    PT Time Calculation (min) 42 min    Equipment Utilized During Treatment Gait belt    Activity Tolerance Patient tolerated treatment well    Behavior During Therapy WFL for tasks assessed/performed                Past Medical History:  Diagnosis Date   Anxiety    CHF (congestive heart failure) (HCC)    COPD (chronic obstructive pulmonary disease) (HCC)    Depression    Dysrhythmia    Hyperlipidemia    Hypertension    Hypothyroidism    Sleep apnea    Past Surgical History:  Procedure Laterality Date   FRACTURE SURGERY     ORIF ANKLE FRACTURE Left 12/17/2020   Procedure: OPEN REDUCTION INTERNAL FIXATION (ORIF) ANKLE FRACTURE;  Surgeon: Gwyneth Revels, DPM;  Location: [facility];  Service: Podiatry;  Laterality: Left;   ORIF FEMUR FRACTURE Left 01/20/2023   Procedure: OPEN REDUCTION INTERNAL FIXATION (ORIF) DISTAL FEMUR FRACTURE;  Surgeon: Roby Lofts, MD;  Location: [facility];  Service: Orthopedics;  Laterality: Left;   Patient Active Problem List   Diagnosis Date Noted   Sepsis due to cellulitis (HCC) 06/11/2023   Paroxysmal atrial fibrillation (HCC) 06/11/2023   Closed left subtrochanteric femur fracture, initial encounter (HCC) 01/19/2023   CKD (chronic kidney disease) stage 4, GFR 15-29 ml/min (HCC) 01/26/2022   Generalized weakness 10/16/2021   Chronic respiratory failure with hypoxia (HCC) 10/16/2021   Hypotension    SIRS (systemic inflammatory response syndrome) (HCC)    AF (paroxysmal atrial  fibrillation) (HCC)    Syncope    Severe sepsis (HCC) 05/20/2021   Pressure injury of skin 04/25/2021   Acute respiratory failure (HCC) 04/21/2021   Acute kidney injury superimposed on CKD (HCC) 04/21/2021   Stage 3b chronic kidney disease (CKD) (HCC) 03/22/2021   Leukocytosis 03/22/2021   Osteomyelitis of ankle or foot, acute, left (HCC) 03/17/2021   Acute on chronic respiratory failure (HCC) 03/17/2021   Osteomyelitis (HCC) 12/16/2020   Depression 12/16/2020   Medicare annual wellness visit, initial 11/14/2019   B12 deficiency 11/14/2019   Hyperlipidemia 10/31/2019   Primary osteoarthritis of right knee 03/16/2019   Sleep apnea 10/31/2018   Traumatic incomplete tear of left rotator cuff 10/02/2018   Morbid obesity with BMI of 45.0-49.9, adult (HCC) 09/20/2018   Hypothyroidism 09/20/2018   Chronic diastolic CHF (congestive heart failure) (HCC) 09/20/2018   Benign essential hypertension 09/20/2018    ONSET DATE: 01/19/23  REFERRING DIAG: W09.811B (ICD-10-CM) - Closed comminuted supracondylar fracture of left femur (HCC)   THERAPY DIAG:  No diagnosis found.  Rationale for Evaluation and Treatment: Rehabilitation  SUBJECTIVE:  SUBJECTIVE STATEMENT:  Pt reports he has not been working on his HEP, was encouraged he needed to do this to progress with therapy.   Pt accompanied by: family member  PERTINENT HISTORY: ORIF of left supracondylar distal femur on 01/20/23. Went to SNF on 01/25/23 for rehab. Pt is here for femoral fracture. When pt was stepping out of the shower his leg gave out and he fell onto the floor. Pt is working on getting the shower changed to make it a simple walk in shower. Pt  reports most of his falls are in the bathroom. Pt has a bar in the shower but is unable to have a bar on  the shower itself due to the set up. Pt is generally inactive and does not walk much around the house, relies on the wheelchair for mobility. Pt wants to get out of the wheel chair for mobility, at least a little bit. When in rehab over the summer he was walking about 20-25 ft at a time.    PAIN:  Are you having pain? Yes: Pain location: left knee Aggravating factors: certain movements   PRECAUTIONS: Fall  RED FLAGS: None   WEIGHT BEARING RESTRICTIONS: No  FALLS: Has patient fallen in last 6 months? Yes. Number of falls 1  LIVING ENVIRONMENT: Lives with: lives with their family Lives in: House/apartment Stairs: Yes: Internal: 1 steps; none and External: 2 steps; can reach both Has following equipment at home: Dan Humphreys - 2 wheeled, Environmental consultant - 4 wheeled, Wheelchair (manual), shower chair, Grab bars, and grabber to pick things up  PLOF: Requires assistive device for independence, Needs assistance with ADLs, Needs assistance with homemaking, Needs assistance with gait, and Needs assistance with transfers  PATIENT GOALS: Improve his mobility.   OBJECTIVE:   DIAGNOSTIC FINDINGS:from 01/20/23:  " IMPRESSION: ORIF of comminuted distal femur fracture with improved fracture alignment from preoperative imaging"  COGNITION: Overall cognitive status: Within functional limits for tasks assessed   SENSATION: Not tested  COORDINATION: Not tested   EDEMA:  Not measured but B LE swelling seemed to be likely based on pt presentation    LOWER EXTREMITY ROM:     Active  Right Eval Left Eval  Hip flexion    Hip extension    Hip abduction    Hip adduction    Hip internal rotation    Hip external rotation    Knee flexion  95*  Knee extension Unable to achieve full extension in seated  Unable to achieve full extension in seated   Ankle dorsiflexion    Ankle plantarflexion    Ankle inversion    Ankle eversion     (Blank rows = not tested) *indicated measured in seated in WC and could  not accurately ID bony landmarks.   LOWER EXTREMITY MMT:    MMT Right Eval Left Eval  Hip flexion 4- 3+  Hip extension    Hip abduction  4-*  Hip adduction  4-  Hip internal rotation 4 3+  Hip external rotation 4 3  Knee flexion 4 4-  Knee extension 4 4-  Ankle dorsiflexion    Ankle plantarflexion    Ankle inversion    Ankle eversion    (Blank rows = not tested)    TRANSFERS: Assistive device utilized: Environmental consultant - 2 wheeled  Sit to stand: Min A and DTR holds walker, pt uses heavy UE assist  Stand to sit: CGA Chair to chair: CGA and uses walker, close guarding Floor:  unable  RAMP:  Ramp Comments: uses manual WC propulsion  CURB:   Curb Comments: uses manual WC propulsion  STAIRS:   Comments: not tested at eval, recomment testing at another time.   GAIT: Gait pattern:  left LE in hip ER throughout to make up for lack of ankle motion and strength. Heavy UE use on bari 2WW  Distance walked: 20 ft  Assistive device utilized: Environmental consultant - 2 wheeled and bariatric walker  Level of assistance: CGA and WC follow  Comments: decreased step length  FUNCTIONAL TESTS:  5 times sit to stand: 29 sec with heavy UE assist and walker Timed up and go (TUG): 88 sec walker 10 meter walk test: unable  PATIENT SURVEYS:  FOTO 28 RAG:49  TODAY'S TREATMENT 07/06/23:                                                                                                                          Therex   Nustep level 2 x 5 minutes with B UE/LE  LAQ 2 x 10 x 3 sec  with 2.5# AW   Seated march 2 x 10 with 2.5# AW   Heel raises 3 x 10 with 2.5# AW  Seated adductor squeeze 2 x 10 reps with bolster between   Seated step over 2 x 10 ea LE ( hip abd/ IR then hip ADD /  ER) focus on foot positioning each rep with 2.5# AW   Hip adduction ball squeeze 2 x 12 with rainbow kickball   Heel raises on incline seated 3 x 10 with 2.5#  STS from standard WC then ambulated with BRW to nustel   Nustep  LE and UE reciprocal movement level 1 x 6 min   Unless otherwise stated, CGA was provided and gait belt donned in order to ensure pt safety   PATIENT EDUCATION: Education details: POC Person educated: Patient Education method: Explanation Education comprehension: verbalized understanding   HOME EXERCISE PROGRAM: Access Code: [email or web address] URL: [email or web address] Date: 05/30/2023 Prepared by: Thresa Ross  Exercises - Seated Long Arc Quad  - 1 x daily - 7 x weekly - 2 sets - 10 reps - 3 sec  hold - Seated March  - 1 x daily - 7 x weekly - 2 sets - 10 reps - Seated single leg step to side   - 1 x daily - 7 x weekly - 2 sets - 10 reps - Seated Heel Raise  - 1 x daily - 7 x weekly - 2 sets - 15 reps   GOALS: Goals reviewed with patient? Yes  SHORT TERM GOALS: Target date: 06/20/2023    Patient will be independent in home exercise program to improve strength/mobility for better functional independence with ADLs. Baseline: No HEP currently  Goal status: INITIAL   LONG TERM GOALS: Target date: 08/15/2023   1.  Patient (> 85 years old) will complete five times sit to stand test in <20 seconds and with UE support only on chair ( can  use walker in standing for balance but cannot pull on walker) indicating an increased LE strength and improved balance. Baseline: 29 sec with heavy UE assist and walker Goal status: INITIAL  2.  Patient will increase FOTO score to equal to or greater than  49   to demonstrate statistically significant improvement in mobility and quality of life.  Baseline: 28 Goal status: INITIAL    3.  Patient will reduce timed up and go to <11 seconds to reduce fall risk and demonstrate improved transfer/gait ability. Baseline: 88 sec with walker and with Heavy UE use to come to standing position  Goal status: INITIAL  4.   Patient will increase 10 meter walk test to > .5 m/s as to improve gait speed for better community ambulation and to  reduce fall risk. Baseline: Unable to ambulate this distance on eval  Goal status: INITIAL    ASSESSMENT:  CLINICAL IMPRESSION:    Pt presents with good motivation for completion of PT activities and seems to be in good spirits. Session focused on seated LE strengthening, use of Nustep for improve LE endurance, and STS with less UE assist to improve his LE strength and functional transfer ability. Pt not completing HEP but encouraged to do so.  Pt will continue to benefit from skilled physical therapy intervention to address impairments, improve QOL, and attain therapy goals.   OBJECTIVE IMPAIRMENTS: Abnormal gait, decreased activity tolerance, decreased balance, decreased endurance, decreased mobility, difficulty walking, decreased ROM, decreased strength, obesity, and pain.   ACTIVITY LIMITATIONS: carrying, lifting, bending, standing, squatting, stairs, transfers, bed mobility, bathing, toileting, dressing, and locomotion level  PARTICIPATION LIMITATIONS: driving, shopping, community activity, and yard work  PERSONAL FACTORS: Age, Behavior pattern, Fitness, Past/current experiences, Time since onset of injury/illness/exacerbation, and 3+ comorbidities: CHF, COPD, HLD, HTN  are also affecting patient's functional outcome.   REHAB POTENTIAL: Fair previous poor compliance with HEP and with therapy visits.   CLINICAL DECISION MAKING: Stable/uncomplicated  EVALUATION COMPLEXITY: Low  PLAN:  PT FREQUENCY: 2x/week  PT DURATION: 12 weeks  PLANNED INTERVENTIONS: Therapeutic exercises, Therapeutic activity, Neuromuscular re-education, Balance training, Gait training, Patient/Family education, Self Care, Joint mobilization, Stair training, and Prosthetic training  PLAN FOR NEXT SESSION: seated or supine therex mixed with closed chain strengthening as pt knee pain allows  Norman Herrlich, PT, DPT 07/06/2023, 11:03 AM

## 2023-07-12 ENCOUNTER — Ambulatory Visit: Payer: Medicare Other | Admitting: Physical Therapy

## 2023-07-12 NOTE — Therapy (Deleted)
OUTPATIENT PHYSICAL THERAPY NEURO TREATMENT   Patient Name: Christopher [name] MRN: [id number] DOB:06-21-1951, 72 y.o., male Today's Date: 07/12/2023   PCP: Danella Penton, MD  REFERRING PROVIDER: Roby Lofts, MD    END OF SESSION:       Past Medical History:  Diagnosis Date   Anxiety    CHF (congestive heart failure) (HCC)    COPD (chronic obstructive pulmonary disease) (HCC)    Depression    Dysrhythmia    Hyperlipidemia    Hypertension    Hypothyroidism    Sleep apnea    Past Surgical History:  Procedure Laterality Date   FRACTURE SURGERY     ORIF ANKLE FRACTURE Left 12/17/2020   Procedure: OPEN REDUCTION INTERNAL FIXATION (ORIF) ANKLE FRACTURE;  Surgeon: Gwyneth Revels, DPM;  Location: [facility];  Service: Podiatry;  Laterality: Left;   ORIF FEMUR FRACTURE Left 01/20/2023   Procedure: OPEN REDUCTION INTERNAL FIXATION (ORIF) DISTAL FEMUR FRACTURE;  Surgeon: Roby Lofts, MD;  Location: [facility];  Service: Orthopedics;  Laterality: Left;   Patient Active Problem List   Diagnosis Date Noted   Sepsis due to cellulitis (HCC) 06/11/2023   Paroxysmal atrial fibrillation (HCC) 06/11/2023   Closed left subtrochanteric femur fracture, initial encounter (HCC) 01/19/2023   CKD (chronic kidney disease) stage 4, GFR 15-29 ml/min (HCC) 01/26/2022   Generalized weakness 10/16/2021   Chronic respiratory failure with hypoxia (HCC) 10/16/2021   Hypotension    SIRS (systemic inflammatory response syndrome) (HCC)    AF (paroxysmal atrial fibrillation) (HCC)    Syncope    Severe sepsis (HCC) 05/20/2021   Pressure injury of skin 04/25/2021   Acute respiratory failure (HCC) 04/21/2021   Acute kidney injury superimposed on CKD (HCC) 04/21/2021   Stage 3b chronic kidney disease (CKD) (HCC) 03/22/2021   Leukocytosis 03/22/2021   Osteomyelitis of ankle or foot, acute, left (HCC) 03/17/2021   Acute on chronic respiratory failure (HCC) 03/17/2021   Osteomyelitis (HCC) 12/16/2020    Depression 12/16/2020   Medicare annual wellness visit, initial 11/14/2019   B12 deficiency 11/14/2019   Hyperlipidemia 10/31/2019   Primary osteoarthritis of right knee 03/16/2019   Sleep apnea 10/31/2018   Traumatic incomplete tear of left rotator cuff 10/02/2018   Morbid obesity with BMI of 45.0-49.9, adult (HCC) 09/20/2018   Hypothyroidism 09/20/2018   Chronic diastolic CHF (congestive heart failure) (HCC) 09/20/2018   Benign essential hypertension 09/20/2018    ONSET DATE: 01/19/23  REFERRING DIAG: N02.725D (ICD-10-CM) - Closed comminuted supracondylar fracture of left femur (HCC)   THERAPY DIAG:  Difficulty in walking, not elsewhere classified  Muscle weakness (generalized)  Unsteadiness on feet  Other abnormalities of gait and mobility  Abnormality of gait and mobility  Rationale for Evaluation and Treatment: Rehabilitation  SUBJECTIVE:  SUBJECTIVE STATEMENT:  Pt reports he has not been working on his HEP, was encouraged he needed to do this to progress with therapy.   Pt accompanied by: family member  PERTINENT HISTORY: ORIF of left supracondylar distal femur on 01/20/23. Went to SNF on 01/25/23 for rehab. Pt is here for femoral fracture. When pt was stepping out of the shower his leg gave out and he fell onto the floor. Pt is working on getting the shower changed to make it a simple walk in shower. Pt  reports most of his falls are in the bathroom. Pt has a bar in the shower but is unable to have a bar on the shower itself due to the set up. Pt is generally inactive and does not walk much around the house, relies on the wheelchair for mobility. Pt wants to get out of the wheel chair for mobility, at least a little bit. When in rehab over the summer he was walking about 20-25 ft at a time.     PAIN:  Are you having pain? Yes: Pain location: left knee Aggravating factors: certain movements   PRECAUTIONS: Fall  RED FLAGS: None   WEIGHT BEARING RESTRICTIONS: No  FALLS: Has patient fallen in last 6 months? Yes. Number of falls 1  LIVING ENVIRONMENT: Lives with: lives with their family Lives in: House/apartment Stairs: Yes: Internal: 1 steps; none and External: 2 steps; can reach both Has following equipment at home: Dan Humphreys - 2 wheeled, Environmental consultant - 4 wheeled, Wheelchair (manual), shower chair, Grab bars, and grabber to pick things up  PLOF: Requires assistive device for independence, Needs assistance with ADLs, Needs assistance with homemaking, Needs assistance with gait, and Needs assistance with transfers  PATIENT GOALS: Improve his mobility.   OBJECTIVE:   DIAGNOSTIC FINDINGS:from 01/20/23:  " IMPRESSION: ORIF of comminuted distal femur fracture with improved fracture alignment from preoperative imaging"  COGNITION: Overall cognitive status: Within functional limits for tasks assessed   SENSATION: Not tested  COORDINATION: Not tested   EDEMA:  Not measured but B LE swelling seemed to be likely based on pt presentation    LOWER EXTREMITY ROM:     Active  Right Eval Left Eval  Hip flexion    Hip extension    Hip abduction    Hip adduction    Hip internal rotation    Hip external rotation    Knee flexion  95*  Knee extension Unable to achieve full extension in seated  Unable to achieve full extension in seated   Ankle dorsiflexion    Ankle plantarflexion    Ankle inversion    Ankle eversion     (Blank rows = not tested) *indicated measured in seated in WC and could not accurately ID bony landmarks.   LOWER EXTREMITY MMT:    MMT Right Eval Left Eval  Hip flexion 4- 3+  Hip extension    Hip abduction  4-*  Hip adduction  4-  Hip internal rotation 4 3+  Hip external rotation 4 3  Knee flexion 4 4-  Knee extension 4 4-  Ankle dorsiflexion     Ankle plantarflexion    Ankle inversion    Ankle eversion    (Blank rows = not tested)    TRANSFERS: Assistive device utilized: Environmental consultant - 2 wheeled  Sit to stand: Min A and DTR holds walker, pt uses heavy UE assist  Stand to sit: CGA Chair to chair: CGA and uses walker, close guarding Floor:  unable  RAMP:  Ramp Comments: uses manual WC propulsion  CURB:   Curb Comments: uses manual WC propulsion  STAIRS:   Comments: not tested at eval, recomment testing at another time.   GAIT: Gait pattern:  left LE in hip ER throughout to make up for lack of ankle motion and strength. Heavy UE use on bari 2WW  Distance walked: 20 ft  Assistive device utilized: Environmental consultant - 2 wheeled and bariatric walker  Level of assistance: CGA and WC follow  Comments: decreased step length  FUNCTIONAL TESTS:  5 times sit to stand: 29 sec with heavy UE assist and walker Timed up and go (TUG): 88 sec walker 10 meter walk test: unable  PATIENT SURVEYS:  FOTO 28 RAG:49  TODAY'S TREATMENT 07/12/23:                                                                                                                          Therex   Nustep level 2 x 5 minutes with B UE/LE  LAQ 2 x 10 x 3 sec  with 2.5# AW   Seated march 2 x 10 with 2.5# AW   Heel raises 3 x 10 with 2.5# AW  Seated adductor squeeze 2 x 10 reps with bolster between   Seated step over 2 x 10 ea LE ( hip abd/ IR then hip ADD /  ER) focus on foot positioning each rep with 2.5# AW   Hip adduction ball squeeze 2 x 12 with rainbow kickball   Heel raises on incline seated 3 x 10 with 2.5#  STS from standard WC then ambulated with BRW to nustel   Nustep LE and UE reciprocal movement level 1 x 6 min   Unless otherwise stated, CGA was provided and gait belt donned in order to ensure pt safety   PATIENT EDUCATION: Education details: POC Person educated: Patient Education method: Explanation Education comprehension: verbalized  understanding   HOME EXERCISE PROGRAM: Access Code: [email or web address] URL: [email or web address] Date: 05/30/2023 Prepared by: Thresa Ross  Exercises - Seated Long Arc Quad  - 1 x daily - 7 x weekly - 2 sets - 10 reps - 3 sec  hold - Seated March  - 1 x daily - 7 x weekly - 2 sets - 10 reps - Seated single leg step to side   - 1 x daily - 7 x weekly - 2 sets - 10 reps - Seated Heel Raise  - 1 x daily - 7 x weekly - 2 sets - 15 reps   GOALS: Goals reviewed with patient? Yes  SHORT TERM GOALS: Target date: 06/20/2023    Patient will be independent in home exercise program to improve strength/mobility for better functional independence with ADLs. Baseline: No HEP currently  Goal status: INITIAL   LONG TERM GOALS: Target date: 08/15/2023   1.  Patient (> 21 years old) will complete five times sit to stand test in <20 seconds and with UE support only on chair ( can  use walker in standing for balance but cannot pull on walker) indicating an increased LE strength and improved balance. Baseline: 29 sec with heavy UE assist and walker Goal status: INITIAL  2.  Patient will increase FOTO score to equal to or greater than  49   to demonstrate statistically significant improvement in mobility and quality of life.  Baseline: 28 Goal status: INITIAL    3.  Patient will reduce timed up and go to <11 seconds to reduce fall risk and demonstrate improved transfer/gait ability. Baseline: 88 sec with walker and with Heavy UE use to come to standing position  Goal status: INITIAL  4.   Patient will increase 10 meter walk test to > .5 m/s as to improve gait speed for better community ambulation and to reduce fall risk. Baseline: Unable to ambulate this distance on eval  Goal status: INITIAL    ASSESSMENT:  CLINICAL IMPRESSION:    Pt presents with good motivation for completion of PT activities and seems to be in good spirits. Session focused on seated LE strengthening, use  of Nustep for improve LE endurance, and STS with less UE assist to improve his LE strength and functional transfer ability. Pt not completing HEP but encouraged to do so.  Pt will continue to benefit from skilled physical therapy intervention to address impairments, improve QOL, and attain therapy goals.   OBJECTIVE IMPAIRMENTS: Abnormal gait, decreased activity tolerance, decreased balance, decreased endurance, decreased mobility, difficulty walking, decreased ROM, decreased strength, obesity, and pain.   ACTIVITY LIMITATIONS: carrying, lifting, bending, standing, squatting, stairs, transfers, bed mobility, bathing, toileting, dressing, and locomotion level  PARTICIPATION LIMITATIONS: driving, shopping, community activity, and yard work  PERSONAL FACTORS: Age, Behavior pattern, Fitness, Past/current experiences, Time since onset of injury/illness/exacerbation, and 3+ comorbidities: CHF, COPD, HLD, HTN  are also affecting patient's functional outcome.   REHAB POTENTIAL: Fair previous poor compliance with HEP and with therapy visits.   CLINICAL DECISION MAKING: Stable/uncomplicated  EVALUATION COMPLEXITY: Low  PLAN:  PT FREQUENCY: 2x/week  PT DURATION: 12 weeks  PLANNED INTERVENTIONS: Therapeutic exercises, Therapeutic activity, Neuromuscular re-education, Balance training, Gait training, Patient/Family education, Self Care, Joint mobilization, Stair training, and Prosthetic training  PLAN FOR NEXT SESSION: seated or supine therex mixed with closed chain strengthening as pt knee pain allows  Norman Herrlich, PT, DPT 07/12/2023, 9:54 AM

## 2023-07-14 ENCOUNTER — Ambulatory Visit: Payer: Medicare Other | Admitting: Physical Therapy

## 2023-07-14 ENCOUNTER — Encounter: Payer: Self-pay | Admitting: Physical Therapy

## 2023-07-14 DIAGNOSIS — R262 Difficulty in walking, not elsewhere classified: Secondary | ICD-10-CM

## 2023-07-14 DIAGNOSIS — R2689 Other abnormalities of gait and mobility: Secondary | ICD-10-CM

## 2023-07-14 DIAGNOSIS — M6281 Muscle weakness (generalized): Secondary | ICD-10-CM

## 2023-07-14 DIAGNOSIS — R2681 Unsteadiness on feet: Secondary | ICD-10-CM

## 2023-07-14 NOTE — Therapy (Signed)
OUTPATIENT PHYSICAL THERAPY NEURO TREATMENT   Patient Name: Christopher [name] MRN: [id number] DOB:February 18, 1951, 72 y.o., male Today's Date: 07/14/2023   PCP: Danella Penton, MD  REFERRING PROVIDER: Roby Lofts, MD    END OF SESSION:  PT End of Session - 07/14/23 1153     Visit Number 7    Number of Visits 24    Date for PT Re-Evaluation 08/15/23    Authorization Type [employer] Medicare    Progress Note Due on Visit 10    PT Start Time 1148    PT Stop Time 1228    PT Time Calculation (min) 40 min    Equipment Utilized During Treatment Gait belt    Activity Tolerance Patient tolerated treatment well    Behavior During Therapy WFL for tasks assessed/performed                 Past Medical History:  Diagnosis Date   Anxiety    CHF (congestive heart failure) (HCC)    COPD (chronic obstructive pulmonary disease) (HCC)    Depression    Dysrhythmia    Hyperlipidemia    Hypertension    Hypothyroidism    Sleep apnea    Past Surgical History:  Procedure Laterality Date   FRACTURE SURGERY     ORIF ANKLE FRACTURE Left 12/17/2020   Procedure: OPEN REDUCTION INTERNAL FIXATION (ORIF) ANKLE FRACTURE;  Surgeon: Gwyneth Revels, DPM;  Location: [facility];  Service: Podiatry;  Laterality: Left;   ORIF FEMUR FRACTURE Left 01/20/2023   Procedure: OPEN REDUCTION INTERNAL FIXATION (ORIF) DISTAL FEMUR FRACTURE;  Surgeon: Roby Lofts, MD;  Location: [facility];  Service: Orthopedics;  Laterality: Left;   Patient Active Problem List   Diagnosis Date Noted   Sepsis due to cellulitis (HCC) 06/11/2023   Paroxysmal atrial fibrillation (HCC) 06/11/2023   Closed left subtrochanteric femur fracture, initial encounter (HCC) 01/19/2023   CKD (chronic kidney disease) stage 4, GFR 15-29 ml/min (HCC) 01/26/2022   Generalized weakness 10/16/2021   Chronic respiratory failure with hypoxia (HCC) 10/16/2021   Hypotension    SIRS (systemic inflammatory response syndrome) (HCC)    AF (paroxysmal atrial  fibrillation) (HCC)    Syncope    Severe sepsis (HCC) 05/20/2021   Pressure injury of skin 04/25/2021   Acute respiratory failure (HCC) 04/21/2021   Acute kidney injury superimposed on CKD (HCC) 04/21/2021   Stage 3b chronic kidney disease (CKD) (HCC) 03/22/2021   Leukocytosis 03/22/2021   Osteomyelitis of ankle or foot, acute, left (HCC) 03/17/2021   Acute on chronic respiratory failure (HCC) 03/17/2021   Osteomyelitis (HCC) 12/16/2020   Depression 12/16/2020   Medicare annual wellness visit, initial 11/14/2019   B12 deficiency 11/14/2019   Hyperlipidemia 10/31/2019   Primary osteoarthritis of right knee 03/16/2019   Sleep apnea 10/31/2018   Traumatic incomplete tear of left rotator cuff 10/02/2018   Morbid obesity with BMI of 45.0-49.9, adult (HCC) 09/20/2018   Hypothyroidism 09/20/2018   Chronic diastolic CHF (congestive heart failure) (HCC) 09/20/2018   Benign essential hypertension 09/20/2018    ONSET DATE: 01/19/23  REFERRING DIAG: E95.284X (ICD-10-CM) - Closed comminuted supracondylar fracture of left femur (HCC)   THERAPY DIAG:  Difficulty in walking, not elsewhere classified  Muscle weakness (generalized)  Unsteadiness on feet  Other abnormalities of gait and mobility  Rationale for Evaluation and Treatment: Rehabilitation  SUBJECTIVE:  SUBJECTIVE STATEMENT:  Pt reports no significant changes since last session.   Pt accompanied by: family member  PERTINENT HISTORY: ORIF of left supracondylar distal femur on 01/20/23. Went to SNF on 01/25/23 for rehab. Pt is here for femoral fracture. When pt was stepping out of the shower his leg gave out and he fell onto the floor. Pt is working on getting the shower changed to make it a simple walk in shower. Pt  reports most of his falls are in  the bathroom. Pt has a bar in the shower but is unable to have a bar on the shower itself due to the set up. Pt is generally inactive and does not walk much around the house, relies on the wheelchair for mobility. Pt wants to get out of the wheel chair for mobility, at least a little bit. When in rehab over the summer he was walking about 20-25 ft at a time.    PAIN:  Are you having pain? Yes: Pain location: left knee Aggravating factors: certain movements   PRECAUTIONS: Fall  RED FLAGS: None   WEIGHT BEARING RESTRICTIONS: No  FALLS: Has patient fallen in last 6 months? Yes. Number of falls 1  LIVING ENVIRONMENT: Lives with: lives with their family Lives in: House/apartment Stairs: Yes: Internal: 1 steps; none and External: 2 steps; can reach both Has following equipment at home: Dan Humphreys - 2 wheeled, Environmental consultant - 4 wheeled, Wheelchair (manual), shower chair, Grab bars, and grabber to pick things up  PLOF: Requires assistive device for independence, Needs assistance with ADLs, Needs assistance with homemaking, Needs assistance with gait, and Needs assistance with transfers  PATIENT GOALS: Improve his mobility.   OBJECTIVE:   DIAGNOSTIC FINDINGS:from 01/20/23:  " IMPRESSION: ORIF of comminuted distal femur fracture with improved fracture alignment from preoperative imaging"  COGNITION: Overall cognitive status: Within functional limits for tasks assessed   SENSATION: Not tested  COORDINATION: Not tested   EDEMA:  Not measured but B LE swelling seemed to be likely based on pt presentation    LOWER EXTREMITY ROM:     Active  Right Eval Left Eval  Hip flexion    Hip extension    Hip abduction    Hip adduction    Hip internal rotation    Hip external rotation    Knee flexion  95*  Knee extension Unable to achieve full extension in seated  Unable to achieve full extension in seated   Ankle dorsiflexion    Ankle plantarflexion    Ankle inversion    Ankle eversion      (Blank rows = not tested) *indicated measured in seated in WC and could not accurately ID bony landmarks.   LOWER EXTREMITY MMT:    MMT Right Eval Left Eval  Hip flexion 4- 3+  Hip extension    Hip abduction  4-*  Hip adduction  4-  Hip internal rotation 4 3+  Hip external rotation 4 3  Knee flexion 4 4-  Knee extension 4 4-  Ankle dorsiflexion    Ankle plantarflexion    Ankle inversion    Ankle eversion    (Blank rows = not tested)    TRANSFERS: Assistive device utilized: Environmental consultant - 2 wheeled  Sit to stand: Min A and DTR holds walker, pt uses heavy UE assist  Stand to sit: CGA Chair to chair: CGA and uses walker, close guarding Floor:  unable  RAMP:   Ramp Comments: uses manual WC propulsion  CURB:   Curb  Comments: uses manual WC propulsion  STAIRS:   Comments: not tested at eval, recomment testing at another time.   GAIT: Gait pattern:  left LE in hip ER throughout to make up for lack of ankle motion and strength. Heavy UE use on bari 2WW  Distance walked: 20 ft  Assistive device utilized: Environmental consultant - 2 wheeled and bariatric walker  Level of assistance: CGA and WC follow  Comments: decreased step length  FUNCTIONAL TESTS:  5 times sit to stand: 29 sec with heavy UE assist and walker Timed up and go (TUG): 88 sec walker 10 meter walk test: unable  PATIENT SURVEYS:  FOTO 28 RAG:49  TODAY'S TREATMENT 07/14/23:                                                                                                                          Therex   Nustep level 1 x 3 minutes and level 2 x 3 min  with B UE/LE  Ambulation x 45 ft with B RW and WC follow, good speed, no LOB noted   LAQ 2 x 10 x 3 sec  with 3# AW   Heel raises on incline seated 3 x 12 with 3#  Seated march 2 x 10 with 2.5# AW   Seated step over 2 x 10 ea LE ( hip abd/ IR then hip ADD /  ER) focus on foot positioning each rep with 3# AW   Seated adductor squeeze 2 x 10 reps with 5 sec hold with  bolster between legs  Ambulation x 18 ft with B RW and WC follow, good speed, no LOB noted   Unless otherwise stated, CGA was provided and gait belt donned in order to ensure pt safety   PATIENT EDUCATION: Education details: POC Person educated: Patient Education method: Explanation Education comprehension: verbalized understanding   HOME EXERCISE PROGRAM: Access Code: [email or web address] URL: [email or web address] Date: 05/30/2023 Prepared by: Thresa Ross  Exercises - Seated Long Arc Quad  - 1 x daily - 7 x weekly - 2 sets - 10 reps - 3 sec  hold - Seated March  - 1 x daily - 7 x weekly - 2 sets - 10 reps - Seated single leg step to side   - 1 x daily - 7 x weekly - 2 sets - 10 reps - Seated Heel Raise  - 1 x daily - 7 x weekly - 2 sets - 15 reps   GOALS: Goals reviewed with patient? Yes  SHORT TERM GOALS: Target date: 06/20/2023    Patient will be independent in home exercise program to improve strength/mobility for better functional independence with ADLs. Baseline: No HEP currently  Goal status: INITIAL   LONG TERM GOALS: Target date: 08/15/2023   1.  Patient (> 48 years old) will complete five times sit to stand test in <20 seconds and with UE support only on chair ( can use walker in standing for balance but cannot pull on walker) indicating  an increased LE strength and improved balance. Baseline: 29 sec with heavy UE assist and walker Goal status: INITIAL  2.  Patient will increase FOTO score to equal to or greater than  49   to demonstrate statistically significant improvement in mobility and quality of life.  Baseline: 28 Goal status: INITIAL    3.  Patient will reduce timed up and go to <11 seconds to reduce fall risk and demonstrate improved transfer/gait ability. Baseline: 88 sec with walker and with Heavy UE use to come to standing position  Goal status: INITIAL  4.   Patient will increase 10 meter walk test to > .5 m/s as to improve gait  speed for better community ambulation and to reduce fall risk. Baseline: Unable to ambulate this distance on eval  Goal status: INITIAL    ASSESSMENT:  CLINICAL IMPRESSION:    Patient arrived with good motivation form completion of pt activities.  Progressed pt volume and load and increased resistance with all exercises. Pt was fatigued but overall progressed well. Pt will continue to benefit from skilled physical therapy intervention to address impairments, improve QOL, and attain therapy goals.    OBJECTIVE IMPAIRMENTS: Abnormal gait, decreased activity tolerance, decreased balance, decreased endurance, decreased mobility, difficulty walking, decreased ROM, decreased strength, obesity, and pain.   ACTIVITY LIMITATIONS: carrying, lifting, bending, standing, squatting, stairs, transfers, bed mobility, bathing, toileting, dressing, and locomotion level  PARTICIPATION LIMITATIONS: driving, shopping, community activity, and yard work  PERSONAL FACTORS: Age, Behavior pattern, Fitness, Past/current experiences, Time since onset of injury/illness/exacerbation, and 3+ comorbidities: CHF, COPD, HLD, HTN  are also affecting patient's functional outcome.   REHAB POTENTIAL: Fair previous poor compliance with HEP and with therapy visits.   CLINICAL DECISION MAKING: Stable/uncomplicated  EVALUATION COMPLEXITY: Low  PLAN:  PT FREQUENCY: 2x/week  PT DURATION: 12 weeks  PLANNED INTERVENTIONS: Therapeutic exercises, Therapeutic activity, Neuromuscular re-education, Balance training, Gait training, Patient/Family education, Self Care, Joint mobilization, Stair training, and Prosthetic training  PLAN FOR NEXT SESSION: seated or supine therex mixed with closed chain strengthening as pt knee pain allows  Norman Herrlich, PT, DPT 07/14/2023, 11:55 AM

## 2023-07-19 ENCOUNTER — Ambulatory Visit: Payer: Medicare Other | Admitting: Physical Therapy

## 2023-07-25 ENCOUNTER — Encounter: Payer: Medicare Other | Admitting: Physical Therapy

## 2023-07-26 ENCOUNTER — Ambulatory Visit: Payer: Medicare Other | Attending: Student | Admitting: Physical Therapy

## 2023-07-26 DIAGNOSIS — R2689 Other abnormalities of gait and mobility: Secondary | ICD-10-CM | POA: Insufficient documentation

## 2023-07-26 DIAGNOSIS — R269 Unspecified abnormalities of gait and mobility: Secondary | ICD-10-CM | POA: Insufficient documentation

## 2023-07-26 DIAGNOSIS — R2681 Unsteadiness on feet: Secondary | ICD-10-CM | POA: Insufficient documentation

## 2023-07-26 DIAGNOSIS — R262 Difficulty in walking, not elsewhere classified: Secondary | ICD-10-CM | POA: Insufficient documentation

## 2023-07-26 DIAGNOSIS — M6281 Muscle weakness (generalized): Secondary | ICD-10-CM | POA: Insufficient documentation

## 2023-07-26 NOTE — Therapy (Signed)
OUTPATIENT PHYSICAL THERAPY NEURO TREATMENT   Patient Name: Christopher [name] MRN: [id number] DOB:Sep 03, 1950, 72 y.o., male Today's Date: 07/26/2023   PCP: Danella Penton, MD  REFERRING PROVIDER: Roby Lofts, MD    END OF SESSION:  PT End of Session - 07/26/23 1058     Visit Number 8    Number of Visits 24    Date for PT Re-Evaluation 08/15/23    Authorization Type [employer] Medicare    Progress Note Due on Visit 10    PT Start Time 1100    PT Stop Time 1143    PT Time Calculation (min) 43 min    Equipment Utilized During Treatment Gait belt    Activity Tolerance Patient tolerated treatment well    Behavior During Therapy WFL for tasks assessed/performed                  Past Medical History:  Diagnosis Date   Anxiety    CHF (congestive heart failure) (HCC)    COPD (chronic obstructive pulmonary disease) (HCC)    Depression    Dysrhythmia    Hyperlipidemia    Hypertension    Hypothyroidism    Sleep apnea    Past Surgical History:  Procedure Laterality Date   FRACTURE SURGERY     ORIF ANKLE FRACTURE Left 12/17/2020   Procedure: OPEN REDUCTION INTERNAL FIXATION (ORIF) ANKLE FRACTURE;  Surgeon: Gwyneth Revels, DPM;  Location: [facility];  Service: Podiatry;  Laterality: Left;   ORIF FEMUR FRACTURE Left 01/20/2023   Procedure: OPEN REDUCTION INTERNAL FIXATION (ORIF) DISTAL FEMUR FRACTURE;  Surgeon: Roby Lofts, MD;  Location: [facility];  Service: Orthopedics;  Laterality: Left;   Patient Active Problem List   Diagnosis Date Noted   Sepsis due to cellulitis (HCC) 06/11/2023   Paroxysmal atrial fibrillation (HCC) 06/11/2023   Closed left subtrochanteric femur fracture, initial encounter (HCC) 01/19/2023   CKD (chronic kidney disease) stage 4, GFR 15-29 ml/min (HCC) 01/26/2022   Generalized weakness 10/16/2021   Chronic respiratory failure with hypoxia (HCC) 10/16/2021   Hypotension    SIRS (systemic inflammatory response syndrome) (HCC)    AF (paroxysmal atrial  fibrillation) (HCC)    Syncope    Severe sepsis (HCC) 05/20/2021   Pressure injury of skin 04/25/2021   Acute respiratory failure (HCC) 04/21/2021   Acute kidney injury superimposed on CKD (HCC) 04/21/2021   Stage 3b chronic kidney disease (CKD) (HCC) 03/22/2021   Leukocytosis 03/22/2021   Osteomyelitis of ankle or foot, acute, left (HCC) 03/17/2021   Acute on chronic respiratory failure (HCC) 03/17/2021   Osteomyelitis (HCC) 12/16/2020   Depression 12/16/2020   Medicare annual wellness visit, initial 11/14/2019   B12 deficiency 11/14/2019   Hyperlipidemia 10/31/2019   Primary osteoarthritis of right knee 03/16/2019   Sleep apnea 10/31/2018   Traumatic incomplete tear of left rotator cuff 10/02/2018   Morbid obesity with BMI of 45.0-49.9, adult (HCC) 09/20/2018   Hypothyroidism 09/20/2018   Chronic diastolic CHF (congestive heart failure) (HCC) 09/20/2018   Benign essential hypertension 09/20/2018    ONSET DATE: 01/19/23  REFERRING DIAG: D63.875I (ICD-10-CM) - Closed comminuted supracondylar fracture of left femur (HCC)   THERAPY DIAG:  Difficulty in walking, not elsewhere classified  Muscle weakness (generalized)  Unsteadiness on feet  Other abnormalities of gait and mobility  Abnormality of gait and mobility  Rationale for Evaluation and Treatment: Rehabilitation  SUBJECTIVE:  SUBJECTIVE STATEMENT:  Pt reports no significant changes since last session. No falls or LOB.   Pt accompanied by: family member  PERTINENT HISTORY: ORIF of left supracondylar distal femur on 01/20/23. Went to SNF on 01/25/23 for rehab. Pt is here for femoral fracture. When pt was stepping out of the shower his leg gave out and he fell onto the floor. Pt is working on getting the shower changed to make it a simple  walk in shower. Pt  reports most of his falls are in the bathroom. Pt has a bar in the shower but is unable to have a bar on the shower itself due to the set up. Pt is generally inactive and does not walk much around the house, relies on the wheelchair for mobility. Pt wants to get out of the wheel chair for mobility, at least a little bit. When in rehab over the summer he was walking about 20-25 ft at a time.    PAIN:  Are you having pain? Yes: Pain location: left knee Aggravating factors: certain movements   PRECAUTIONS: Fall  RED FLAGS: None   WEIGHT BEARING RESTRICTIONS: No  FALLS: Has patient fallen in last 6 months? Yes. Number of falls 1  LIVING ENVIRONMENT: Lives with: lives with their family Lives in: House/apartment Stairs: Yes: Internal: 1 steps; none and External: 2 steps; can reach both Has following equipment at home: Dan Humphreys - 2 wheeled, Environmental consultant - 4 wheeled, Wheelchair (manual), shower chair, Grab bars, and grabber to pick things up  PLOF: Requires assistive device for independence, Needs assistance with ADLs, Needs assistance with homemaking, Needs assistance with gait, and Needs assistance with transfers  PATIENT GOALS: Improve his mobility.   OBJECTIVE:   DIAGNOSTIC FINDINGS:from 01/20/23:  " IMPRESSION: ORIF of comminuted distal femur fracture with improved fracture alignment from preoperative imaging"  COGNITION: Overall cognitive status: Within functional limits for tasks assessed   SENSATION: Not tested  COORDINATION: Not tested   EDEMA:  Not measured but B LE swelling seemed to be likely based on pt presentation    LOWER EXTREMITY ROM:     Active  Right Eval Left Eval  Hip flexion    Hip extension    Hip abduction    Hip adduction    Hip internal rotation    Hip external rotation    Knee flexion  95*  Knee extension Unable to achieve full extension in seated  Unable to achieve full extension in seated   Ankle dorsiflexion    Ankle  plantarflexion    Ankle inversion    Ankle eversion     (Blank rows = not tested) *indicated measured in seated in WC and could not accurately ID bony landmarks.   LOWER EXTREMITY MMT:    MMT Right Eval Left Eval  Hip flexion 4- 3+  Hip extension    Hip abduction  4-*  Hip adduction  4-  Hip internal rotation 4 3+  Hip external rotation 4 3  Knee flexion 4 4-  Knee extension 4 4-  Ankle dorsiflexion    Ankle plantarflexion    Ankle inversion    Ankle eversion    (Blank rows = not tested)    TRANSFERS: Assistive device utilized: Environmental consultant - 2 wheeled  Sit to stand: Min A and DTR holds walker, pt uses heavy UE assist  Stand to sit: CGA Chair to chair: CGA and uses walker, close guarding Floor:  unable  RAMP:   Ramp Comments: uses manual WC propulsion  CURB:   Curb Comments: uses manual WC propulsion  STAIRS:   Comments: not tested at eval, recomment testing at another time.   GAIT: Gait pattern:  left LE in hip ER throughout to make up for lack of ankle motion and strength. Heavy UE use on bari 2WW  Distance walked: 20 ft  Assistive device utilized: Environmental consultant - 2 wheeled and bariatric walker  Level of assistance: CGA and WC follow  Comments: decreased step length  FUNCTIONAL TESTS:  5 times sit to stand: 29 sec with heavy UE assist and walker Timed up and go (TUG): 88 sec walker 10 meter walk test: unable  PATIENT SURVEYS:  FOTO 28 RAG:49  TODAY'S TREATMENT 07/26/23:                                                                                                                          Therex   Ambulation x 45 ft with B RW and WC follow, good speed, no LOB noted   LAQ 2 x 12 x 3 sec  with 3# AW   Heel raises on incline seated 3 x 15 with 3#  Seated march 2 x 12 with 3# AW   Ambulation x 30 ft with B RW and C follow, L foot ER throughout per pt normal foot orientation.   Seated step over 2 x 10 ea LE ( hip abd/ IR then hip ADD /  ER) focus on foot  positioning each rep with 3# AW   Seated adductor squeeze 2 x 10 reps with 5 sec hold with bolster between legs  Seated HS curl with GTB 2 x 12 ea LE   Nustep level 1 x 3 minutes and level 2 x 3 min  with B UE/LE 2 min REST Level 8 LE only 2 x 45 sec for LE strength and power   Ambulation x 10 ft with B RW to WC to end session  Unless otherwise stated, CGA was provided and gait belt donned in order to ensure pt safety   PATIENT EDUCATION: Education details: POC Person educated: Patient Education method: Explanation Education comprehension: verbalized understanding   HOME EXERCISE PROGRAM: Access Code: [email or web address] URL: [email or web address] Date: 05/30/2023 Prepared by: Thresa Ross  Exercises - Seated Long Arc Quad  - 1 x daily - 7 x weekly - 2 sets - 10 reps - 3 sec  hold - Seated March  - 1 x daily - 7 x weekly - 2 sets - 10 reps - Seated single leg step to side   - 1 x daily - 7 x weekly - 2 sets - 10 reps - Seated Heel Raise  - 1 x daily - 7 x weekly - 2 sets - 15 reps   GOALS: Goals reviewed with patient? Yes  SHORT TERM GOALS: Target date: 06/20/2023    Patient will be independent in home exercise program to improve strength/mobility for better functional independence with ADLs. Baseline: No HEP currently  Goal status:  INITIAL   LONG TERM GOALS: Target date: 08/15/2023   1.  Patient (> 77 years old) will complete five times sit to stand test in <20 seconds and with UE support only on chair ( can use walker in standing for balance but cannot pull on walker) indicating an increased LE strength and improved balance. Baseline: 29 sec with heavy UE assist and walker Goal status: INITIAL  2.  Patient will increase FOTO score to equal to or greater than  49   to demonstrate statistically significant improvement in mobility and quality of life.  Baseline: 28 Goal status: INITIAL    3.  Patient will reduce timed up and go to <11 seconds to reduce  fall risk and demonstrate improved transfer/gait ability. Baseline: 88 sec with walker and with Heavy UE use to come to standing position  Goal status: INITIAL  4.   Patient will increase 10 meter walk test to > .5 m/s as to improve gait speed for better community ambulation and to reduce fall risk. Baseline: Unable to ambulate this distance on eval  Goal status: INITIAL    ASSESSMENT:  CLINICAL IMPRESSION:    Patient arrived with good motivation form completion of pt activities.  Progressed pt volume and load and increased resistance with all exercises. Pt was fatigued but overall progressed well. Pt not compliant with HEP but is working harder and is motivation within session. Pt will continue to benefit from skilled physical therapy intervention to address impairments, improve QOL, and attain therapy goals.    OBJECTIVE IMPAIRMENTS: Abnormal gait, decreased activity tolerance, decreased balance, decreased endurance, decreased mobility, difficulty walking, decreased ROM, decreased strength, obesity, and pain.   ACTIVITY LIMITATIONS: carrying, lifting, bending, standing, squatting, stairs, transfers, bed mobility, bathing, toileting, dressing, and locomotion level  PARTICIPATION LIMITATIONS: driving, shopping, community activity, and yard work  PERSONAL FACTORS: Age, Behavior pattern, Fitness, Past/current experiences, Time since onset of injury/illness/exacerbation, and 3+ comorbidities: CHF, COPD, HLD, HTN  are also affecting patient's functional outcome.   REHAB POTENTIAL: Fair previous poor compliance with HEP and with therapy visits.   CLINICAL DECISION MAKING: Stable/uncomplicated  EVALUATION COMPLEXITY: Low  PLAN:  PT FREQUENCY: 2x/week  PT DURATION: 12 weeks  PLANNED INTERVENTIONS: Therapeutic exercises, Therapeutic activity, Neuromuscular re-education, Balance training, Gait training, Patient/Family education, Self Care, Joint mobilization, Stair training, and  Prosthetic training  PLAN FOR NEXT SESSION: seated or supine therex mixed with closed chain strengthening as pt knee pain allows  Norman Herrlich, PT, DPT 07/26/2023, 11:01 AM

## 2023-07-28 ENCOUNTER — Ambulatory Visit: Payer: Medicare Other | Admitting: Physical Therapy

## 2023-07-28 NOTE — Therapy (Unsigned)
OUTPATIENT PHYSICAL THERAPY NEURO TREATMENT   Patient Name: Christopher [name] MRN: [id number] DOB:[birth year], 72 y.o., male Today's Date: 07/28/2023   PCP: Danella Penton, MD  REFERRING PROVIDER: Roby Lofts, MD    END OF SESSION:         Past Medical History:  Diagnosis Date   Anxiety    CHF (congestive heart failure) (HCC)    COPD (chronic obstructive pulmonary disease) (HCC)    Depression    Dysrhythmia    Hyperlipidemia    Hypertension    Hypothyroidism    Sleep apnea    Past Surgical History:  Procedure Laterality Date   FRACTURE SURGERY     ORIF ANKLE FRACTURE Left 12/17/2020   Procedure: OPEN REDUCTION INTERNAL FIXATION (ORIF) ANKLE FRACTURE;  Surgeon: Gwyneth Revels, DPM;  Location: [facility];  Service: Podiatry;  Laterality: Left;   ORIF FEMUR FRACTURE Left 01/20/2023   Procedure: OPEN REDUCTION INTERNAL FIXATION (ORIF) DISTAL FEMUR FRACTURE;  Surgeon: Roby Lofts, MD;  Location: [facility];  Service: Orthopedics;  Laterality: Left;   Patient Active Problem List   Diagnosis Date Noted   Sepsis due to cellulitis (HCC) 06/11/2023   Paroxysmal atrial fibrillation (HCC) 06/11/2023   Closed left subtrochanteric femur fracture, initial encounter (HCC) 01/19/2023   CKD (chronic kidney disease) stage 4, GFR 15-29 ml/min (HCC) 01/26/2022   Generalized weakness 10/16/2021   Chronic respiratory failure with hypoxia (HCC) 10/16/2021   Hypotension    SIRS (systemic inflammatory response syndrome) (HCC)    AF (paroxysmal atrial fibrillation) (HCC)    Syncope    Severe sepsis (HCC) 05/20/2021   Pressure injury of skin 04/25/2021   Acute respiratory failure (HCC) 04/21/2021   Acute kidney injury superimposed on CKD (HCC) 04/21/2021   Stage 3b chronic kidney disease (CKD) (HCC) 03/22/2021   Leukocytosis 03/22/2021   Osteomyelitis of ankle or foot, acute, left (HCC) 03/17/2021   Acute on chronic respiratory failure (HCC) 03/17/2021   Osteomyelitis (HCC) 12/16/2020    Depression 12/16/2020   Medicare annual wellness visit, initial 11/14/2019   B12 deficiency 11/14/2019   Hyperlipidemia 10/31/2019   Primary osteoarthritis of right knee 03/16/2019   Sleep apnea 10/31/2018   Traumatic incomplete tear of left rotator cuff 10/02/2018   Morbid obesity with BMI of 45.0-49.9, adult (HCC) 09/20/2018   Hypothyroidism 09/20/2018   Chronic diastolic CHF (congestive heart failure) (HCC) 09/20/2018   Benign essential hypertension 09/20/2018    ONSET DATE: 01/19/23  REFERRING DIAG: I34.742V (ICD-10-CM) - Closed comminuted supracondylar fracture of left femur (HCC)   THERAPY DIAG:  Difficulty in walking, not elsewhere classified  Muscle weakness (generalized)  Unsteadiness on feet  Other abnormalities of gait and mobility  Abnormality of gait and mobility  Rationale for Evaluation and Treatment: Rehabilitation  SUBJECTIVE:  SUBJECTIVE STATEMENT:  Pt reports no significant changes since last session. No falls or LOB.   Pt accompanied by: family member  PERTINENT HISTORY: ORIF of left supracondylar distal femur on 01/20/23. Went to SNF on 01/25/23 for rehab. Pt is here for femoral fracture. When pt was stepping out of the shower his leg gave out and he fell onto the floor. Pt is working on getting the shower changed to make it a simple walk in shower. Pt  reports most of his falls are in the bathroom. Pt has a bar in the shower but is unable to have a bar on the shower itself due to the set up. Pt is generally inactive and does not walk much around the house, relies on the wheelchair for mobility. Pt wants to get out of the wheel chair for mobility, at least a little bit. When in rehab over the summer he was walking about 20-25 ft at a time.    PAIN:  Are you having pain?  Yes: Pain location: left knee Aggravating factors: certain movements   PRECAUTIONS: Fall  RED FLAGS: None   WEIGHT BEARING RESTRICTIONS: No  FALLS: Has patient fallen in last 6 months? Yes. Number of falls 1  LIVING ENVIRONMENT: Lives with: lives with their family Lives in: House/apartment Stairs: Yes: Internal: 1 steps; none and External: 2 steps; can reach both Has following equipment at home: Dan Humphreys - 2 wheeled, Environmental consultant - 4 wheeled, Wheelchair (manual), shower chair, Grab bars, and grabber to pick things up  PLOF: Requires assistive device for independence, Needs assistance with ADLs, Needs assistance with homemaking, Needs assistance with gait, and Needs assistance with transfers  PATIENT GOALS: Improve his mobility.   OBJECTIVE:   DIAGNOSTIC FINDINGS:from 01/20/23:  " IMPRESSION: ORIF of comminuted distal femur fracture with improved fracture alignment from preoperative imaging"  COGNITION: Overall cognitive status: Within functional limits for tasks assessed   SENSATION: Not tested  COORDINATION: Not tested   EDEMA:  Not measured but B LE swelling seemed to be likely based on pt presentation    LOWER EXTREMITY ROM:     Active  Right Eval Left Eval  Hip flexion    Hip extension    Hip abduction    Hip adduction    Hip internal rotation    Hip external rotation    Knee flexion  95*  Knee extension Unable to achieve full extension in seated  Unable to achieve full extension in seated   Ankle dorsiflexion    Ankle plantarflexion    Ankle inversion    Ankle eversion     (Blank rows = not tested) *indicated measured in seated in WC and could not accurately ID bony landmarks.   LOWER EXTREMITY MMT:    MMT Right Eval Left Eval  Hip flexion 4- 3+  Hip extension    Hip abduction  4-*  Hip adduction  4-  Hip internal rotation 4 3+  Hip external rotation 4 3  Knee flexion 4 4-  Knee extension 4 4-  Ankle dorsiflexion    Ankle plantarflexion     Ankle inversion    Ankle eversion    (Blank rows = not tested)    TRANSFERS: Assistive device utilized: Environmental consultant - 2 wheeled  Sit to stand: Min A and DTR holds walker, pt uses heavy UE assist  Stand to sit: CGA Chair to chair: CGA and uses walker, close guarding Floor:  unable  RAMP:   Ramp Comments: uses manual WC propulsion  CURB:   Curb Comments: uses manual WC propulsion  STAIRS:   Comments: not tested at eval, recomment testing at another time.   GAIT: Gait pattern:  left LE in hip ER throughout to make up for lack of ankle motion and strength. Heavy UE use on bari 2WW  Distance walked: 20 ft  Assistive device utilized: Environmental consultant - 2 wheeled and bariatric walker  Level of assistance: CGA and WC follow  Comments: decreased step length  FUNCTIONAL TESTS:  5 times sit to stand: 29 sec with heavy UE assist and walker Timed up and go (TUG): 88 sec walker 10 meter walk test: unable  PATIENT SURVEYS:  FOTO 28 RAG:49  TODAY'S TREATMENT 07/28/23:                                                                                                                          Therex   Ambulation x 45 ft with B RW and WC follow, good speed, no LOB noted   LAQ 2 x 12 x 3 sec  with 3# AW   Heel raises on incline seated 3 x 15 with 3#  Seated march 2 x 12 with 3# AW   Ambulation x 30 ft with B RW and C follow, L foot ER throughout per pt normal foot orientation.   Seated step over 2 x 10 ea LE ( hip abd/ IR then hip ADD /  ER) focus on foot positioning each rep with 3# AW   Seated adductor squeeze 2 x 10 reps with 5 sec hold with bolster between legs  Seated HS curl with GTB 2 x 12 ea LE   Nustep level 1 x 3 minutes and level 2 x 3 min  with B UE/LE 2 min REST Level 8 LE only 2 x 45 sec for LE strength and power   Ambulation x 10 ft with B RW to WC to end session  Unless otherwise stated, CGA was provided and gait belt donned in order to ensure pt safety   PATIENT  EDUCATION: Education details: POC Person educated: Patient Education method: Explanation Education comprehension: verbalized understanding   HOME EXERCISE PROGRAM: Access Code: [email or web address] URL: [email or web address] Date: 05/30/2023 Prepared by: Thresa Ross  Exercises - Seated Long Arc Quad  - 1 x daily - 7 x weekly - 2 sets - 10 reps - 3 sec  hold - Seated March  - 1 x daily - 7 x weekly - 2 sets - 10 reps - Seated single leg step to side   - 1 x daily - 7 x weekly - 2 sets - 10 reps - Seated Heel Raise  - 1 x daily - 7 x weekly - 2 sets - 15 reps   GOALS: Goals reviewed with patient? Yes  SHORT TERM GOALS: Target date: 06/20/2023    Patient will be independent in home exercise program to improve strength/mobility for better functional independence with ADLs. Baseline: No HEP currently  Goal status:  INITIAL   LONG TERM GOALS: Target date: 08/15/2023   1.  Patient (> 59 years old) will complete five times sit to stand test in <20 seconds and with UE support only on chair ( can use walker in standing for balance but cannot pull on walker) indicating an increased LE strength and improved balance. Baseline: 29 sec with heavy UE assist and walker Goal status: INITIAL  2.  Patient will increase FOTO score to equal to or greater than  49   to demonstrate statistically significant improvement in mobility and quality of life.  Baseline: 28 Goal status: INITIAL    3.  Patient will reduce timed up and go to <11 seconds to reduce fall risk and demonstrate improved transfer/gait ability. Baseline: 88 sec with walker and with Heavy UE use to come to standing position  Goal status: INITIAL  4.   Patient will increase 10 meter walk test to > .5 m/s as to improve gait speed for better community ambulation and to reduce fall risk. Baseline: Unable to ambulate this distance on eval  Goal status: INITIAL    ASSESSMENT:  CLINICAL IMPRESSION:    Patient arrived  with good motivation form completion of pt activities.  Progressed pt volume and load and increased resistance with all exercises. Pt was fatigued but overall progressed well. Pt not compliant with HEP but is working harder and is motivated within session. Pt will continue to benefit from skilled physical therapy intervention to address impairments, improve QOL, and attain therapy goals.    OBJECTIVE IMPAIRMENTS: Abnormal gait, decreased activity tolerance, decreased balance, decreased endurance, decreased mobility, difficulty walking, decreased ROM, decreased strength, obesity, and pain.   ACTIVITY LIMITATIONS: carrying, lifting, bending, standing, squatting, stairs, transfers, bed mobility, bathing, toileting, dressing, and locomotion level  PARTICIPATION LIMITATIONS: driving, shopping, community activity, and yard work  PERSONAL FACTORS: Age, Behavior pattern, Fitness, Past/current experiences, Time since onset of injury/illness/exacerbation, and 3+ comorbidities: CHF, COPD, HLD, HTN  are also affecting patient's functional outcome.   REHAB POTENTIAL: Fair previous poor compliance with HEP and with therapy visits.   CLINICAL DECISION MAKING: Stable/uncomplicated  EVALUATION COMPLEXITY: Low  PLAN:  PT FREQUENCY: 2x/week  PT DURATION: 12 weeks  PLANNED INTERVENTIONS: Therapeutic exercises, Therapeutic activity, Neuromuscular re-education, Balance training, Gait training, Patient/Family education, Self Care, Joint mobilization, Stair training, and Prosthetic training  PLAN FOR NEXT SESSION: seated or supine therex mixed with closed chain strengthening as pt knee pain allows  Norman Herrlich, PT, DPT 07/28/2023, 8:59 AM

## 2023-08-02 ENCOUNTER — Ambulatory Visit: Payer: Medicare Other | Admitting: Physical Therapy

## 2023-08-02 ENCOUNTER — Telehealth: Payer: Self-pay | Admitting: Physical Therapy

## 2023-08-02 NOTE — Therapy (Unsigned)
OUTPATIENT PHYSICAL THERAPY NEURO TREATMENT   Patient Name: Christopher [name] MRN: [id number] DOB:1950-12-17, 72 y.o., male Today's Date: 08/02/2023   PCP: Danella Penton, MD  REFERRING PROVIDER: Roby Lofts, MD    END OF SESSION:         Past Medical History:  Diagnosis Date   Anxiety    CHF (congestive heart failure) (HCC)    COPD (chronic obstructive pulmonary disease) (HCC)    Depression    Dysrhythmia    Hyperlipidemia    Hypertension    Hypothyroidism    Sleep apnea    Past Surgical History:  Procedure Laterality Date   FRACTURE SURGERY     ORIF ANKLE FRACTURE Left 12/17/2020   Procedure: OPEN REDUCTION INTERNAL FIXATION (ORIF) ANKLE FRACTURE;  Surgeon: Gwyneth Revels, DPM;  Location: [facility];  Service: Podiatry;  Laterality: Left;   ORIF FEMUR FRACTURE Left 01/20/2023   Procedure: OPEN REDUCTION INTERNAL FIXATION (ORIF) DISTAL FEMUR FRACTURE;  Surgeon: Roby Lofts, MD;  Location: [facility];  Service: Orthopedics;  Laterality: Left;   Patient Active Problem List   Diagnosis Date Noted   Sepsis due to cellulitis (HCC) 06/11/2023   Paroxysmal atrial fibrillation (HCC) 06/11/2023   Closed left subtrochanteric femur fracture, initial encounter (HCC) 01/19/2023   CKD (chronic kidney disease) stage 4, GFR 15-29 ml/min (HCC) 01/26/2022   Generalized weakness 10/16/2021   Chronic respiratory failure with hypoxia (HCC) 10/16/2021   Hypotension    SIRS (systemic inflammatory response syndrome) (HCC)    AF (paroxysmal atrial fibrillation) (HCC)    Syncope    Severe sepsis (HCC) 05/20/2021   Pressure injury of skin 04/25/2021   Acute respiratory failure (HCC) 04/21/2021   Acute kidney injury superimposed on CKD (HCC) 04/21/2021   Stage 3b chronic kidney disease (CKD) (HCC) 03/22/2021   Leukocytosis 03/22/2021   Osteomyelitis of ankle or foot, acute, left (HCC) 03/17/2021   Acute on chronic respiratory failure (HCC) 03/17/2021   Osteomyelitis (HCC) 12/16/2020    Depression 12/16/2020   Medicare annual wellness visit, initial 11/14/2019   B12 deficiency 11/14/2019   Hyperlipidemia 10/31/2019   Primary osteoarthritis of right knee 03/16/2019   Sleep apnea 10/31/2018   Traumatic incomplete tear of left rotator cuff 10/02/2018   Morbid obesity with BMI of 45.0-49.9, adult (HCC) 09/20/2018   Hypothyroidism 09/20/2018   Chronic diastolic CHF (congestive heart failure) (HCC) 09/20/2018   Benign essential hypertension 09/20/2018    ONSET DATE: 01/19/23  REFERRING DIAG: M57.846N (ICD-10-CM) - Closed comminuted supracondylar fracture of left femur (HCC)   THERAPY DIAG:  No diagnosis found.  Rationale for Evaluation and Treatment: Rehabilitation  SUBJECTIVE:  SUBJECTIVE STATEMENT:  Pt reports no significant changes since last session. No falls or LOB.   Pt accompanied by: family member  PERTINENT HISTORY: ORIF of left supracondylar distal femur on 01/20/23. Went to SNF on 01/25/23 for rehab. Pt is here for femoral fracture. When pt was stepping out of the shower his leg gave out and he fell onto the floor. Pt is working on getting the shower changed to make it a simple walk in shower. Pt  reports most of his falls are in the bathroom. Pt has a bar in the shower but is unable to have a bar on the shower itself due to the set up. Pt is generally inactive and does not walk much around the house, relies on the wheelchair for mobility. Pt wants to get out of the wheel chair for mobility, at least a little bit. When in rehab over the summer he was walking about 20-25 ft at a time.    PAIN:  Are you having pain? Yes: Pain location: left knee Aggravating factors: certain movements   PRECAUTIONS: Fall  RED FLAGS: None   WEIGHT BEARING RESTRICTIONS: No  FALLS: Has  patient fallen in last 6 months? Yes. Number of falls 1  LIVING ENVIRONMENT: Lives with: lives with their family Lives in: House/apartment Stairs: Yes: Internal: 1 steps; none and External: 2 steps; can reach both Has following equipment at home: Dan Humphreys - 2 wheeled, Environmental consultant - 4 wheeled, Wheelchair (manual), shower chair, Grab bars, and grabber to pick things up  PLOF: Requires assistive device for independence, Needs assistance with ADLs, Needs assistance with homemaking, Needs assistance with gait, and Needs assistance with transfers  PATIENT GOALS: Improve his mobility.   OBJECTIVE:   DIAGNOSTIC FINDINGS:from 01/20/23:  " IMPRESSION: ORIF of comminuted distal femur fracture with improved fracture alignment from preoperative imaging"  COGNITION: Overall cognitive status: Within functional limits for tasks assessed   SENSATION: Not tested  COORDINATION: Not tested   EDEMA:  Not measured but B LE swelling seemed to be likely based on pt presentation    LOWER EXTREMITY ROM:     Active  Right Eval Left Eval  Hip flexion    Hip extension    Hip abduction    Hip adduction    Hip internal rotation    Hip external rotation    Knee flexion  95*  Knee extension Unable to achieve full extension in seated  Unable to achieve full extension in seated   Ankle dorsiflexion    Ankle plantarflexion    Ankle inversion    Ankle eversion     (Blank rows = not tested) *indicated measured in seated in WC and could not accurately ID bony landmarks.   LOWER EXTREMITY MMT:    MMT Right Eval Left Eval  Hip flexion 4- 3+  Hip extension    Hip abduction  4-*  Hip adduction  4-  Hip internal rotation 4 3+  Hip external rotation 4 3  Knee flexion 4 4-  Knee extension 4 4-  Ankle dorsiflexion    Ankle plantarflexion    Ankle inversion    Ankle eversion    (Blank rows = not tested)    TRANSFERS: Assistive device utilized: Environmental consultant - 2 wheeled  Sit to stand: Min A and DTR holds  walker, pt uses heavy UE assist  Stand to sit: CGA Chair to chair: CGA and uses walker, close guarding Floor:  unable  RAMP:   Ramp Comments: uses manual WC propulsion  CURB:   Curb Comments: uses manual WC propulsion  STAIRS:   Comments: not tested at eval, recomment testing at another time.   GAIT: Gait pattern:  left LE in hip ER throughout to make up for lack of ankle motion and strength. Heavy UE use on bari 2WW  Distance walked: 20 ft  Assistive device utilized: Environmental consultant - 2 wheeled and bariatric walker  Level of assistance: CGA and WC follow  Comments: decreased step length  FUNCTIONAL TESTS:  5 times sit to stand: 29 sec with heavy UE assist and walker Timed up and go (TUG): 88 sec walker 10 meter walk test: unable  PATIENT SURVEYS:  FOTO 28 RAG:49  TODAY'S TREATMENT 08/02/23:                                                                                                                          Therex   Ambulation x 45 ft with B RW and WC follow, good speed, no LOB noted   LAQ 2 x 12 x 3 sec  with 3# AW   Heel raises on incline seated 3 x 15 with 3#  Seated march 2 x 12 with 3# AW   Ambulation x 30 ft with B RW and C follow, L foot ER throughout per pt normal foot orientation.   Seated step over 2 x 10 ea LE ( hip abd/ IR then hip ADD /  ER) focus on foot positioning each rep with 3# AW   Seated adductor squeeze 2 x 10 reps with 5 sec hold with bolster between legs  Seated HS curl with GTB 2 x 12 ea LE   Nustep level 1 x 3 minutes and level 2 x 3 min  with B UE/LE 2 min REST Level 8 LE only 2 x 45 sec for LE strength and power   Ambulation x 10 ft with B RW to WC to end session  Unless otherwise stated, CGA was provided and gait belt donned in order to ensure pt safety   PATIENT EDUCATION: Education details: POC Person educated: Patient Education method: Explanation Education comprehension: verbalized understanding   HOME EXERCISE  PROGRAM: Access Code: [email or web address] URL: [email or web address] Date: 05/30/2023 Prepared by: Thresa Ross  Exercises - Seated Long Arc Quad  - 1 x daily - 7 x weekly - 2 sets - 10 reps - 3 sec  hold - Seated March  - 1 x daily - 7 x weekly - 2 sets - 10 reps - Seated single leg step to side   - 1 x daily - 7 x weekly - 2 sets - 10 reps - Seated Heel Raise  - 1 x daily - 7 x weekly - 2 sets - 15 reps   GOALS: Goals reviewed with patient? Yes  SHORT TERM GOALS: Target date: 06/20/2023    Patient will be independent in home exercise program to improve strength/mobility for better functional independence with ADLs. Baseline: No HEP currently  Goal status:  INITIAL   LONG TERM GOALS: Target date: 08/15/2023   1.  Patient (> 79 years old) will complete five times sit to stand test in <20 seconds and with UE support only on chair ( can use walker in standing for balance but cannot pull on walker) indicating an increased LE strength and improved balance. Baseline: 29 sec with heavy UE assist and walker Goal status: INITIAL  2.  Patient will increase FOTO score to equal to or greater than  49   to demonstrate statistically significant improvement in mobility and quality of life.  Baseline: 28 Goal status: INITIAL    3.  Patient will reduce timed up and go to <11 seconds to reduce fall risk and demonstrate improved transfer/gait ability. Baseline: 88 sec with walker and with Heavy UE use to come to standing position  Goal status: INITIAL  4.   Patient will increase 10 meter walk test to > .5 m/s as to improve gait speed for better community ambulation and to reduce fall risk. Baseline: Unable to ambulate this distance on eval  Goal status: INITIAL    ASSESSMENT:  CLINICAL IMPRESSION:    Patient arrived with good motivation form completion of pt activities.  Progressed pt volume and load and increased resistance with all exercises. Pt was fatigued but overall  progressed well. Pt not compliant with HEP but is working harder and is motivated within session. Pt will continue to benefit from skilled physical therapy intervention to address impairments, improve QOL, and attain therapy goals.    OBJECTIVE IMPAIRMENTS: Abnormal gait, decreased activity tolerance, decreased balance, decreased endurance, decreased mobility, difficulty walking, decreased ROM, decreased strength, obesity, and pain.   ACTIVITY LIMITATIONS: carrying, lifting, bending, standing, squatting, stairs, transfers, bed mobility, bathing, toileting, dressing, and locomotion level  PARTICIPATION LIMITATIONS: driving, shopping, community activity, and yard work  PERSONAL FACTORS: Age, Behavior pattern, Fitness, Past/current experiences, Time since onset of injury/illness/exacerbation, and 3+ comorbidities: CHF, COPD, HLD, HTN  are also affecting patient's functional outcome.   REHAB POTENTIAL: Fair previous poor compliance with HEP and with therapy visits.   CLINICAL DECISION MAKING: Stable/uncomplicated  EVALUATION COMPLEXITY: Low  PLAN:  PT FREQUENCY: 2x/week  PT DURATION: 12 weeks  PLANNED INTERVENTIONS: Therapeutic exercises, Therapeutic activity, Neuromuscular re-education, Balance training, Gait training, Patient/Family education, Self Care, Joint mobilization, Stair training, and Prosthetic training  PLAN FOR NEXT SESSION: seated or supine therex mixed with closed chain strengthening as pt knee pain allows  Norman Herrlich, PT, DPT 08/02/2023, 9:55 AM

## 2023-08-02 NOTE — Telephone Encounter (Signed)
Pt contacted via telephone and author left voice mail informing of missed appointment.   Thresa Ross PT, DPT

## 2023-08-04 ENCOUNTER — Encounter: Payer: Self-pay | Admitting: Physical Therapy

## 2023-08-04 ENCOUNTER — Ambulatory Visit: Payer: Medicare Other | Admitting: Physical Therapy

## 2023-08-04 DIAGNOSIS — R269 Unspecified abnormalities of gait and mobility: Secondary | ICD-10-CM

## 2023-08-04 DIAGNOSIS — R2689 Other abnormalities of gait and mobility: Secondary | ICD-10-CM

## 2023-08-04 DIAGNOSIS — R2681 Unsteadiness on feet: Secondary | ICD-10-CM

## 2023-08-04 DIAGNOSIS — R262 Difficulty in walking, not elsewhere classified: Secondary | ICD-10-CM

## 2023-08-04 DIAGNOSIS — M6281 Muscle weakness (generalized): Secondary | ICD-10-CM

## 2023-08-04 NOTE — Therapy (Signed)
OUTPATIENT PHYSICAL THERAPY NEURO TREATMENT   Patient Name: Christopher [name] MRN: [id number] DOB:Jun 22, 1951, 72 y.o., male Today's Date: 08/04/2023   PCP: Danella Penton, MD  REFERRING PROVIDER: Roby Lofts, MD    END OF SESSION:  PT End of Session - 08/04/23 1059     Visit Number 9    Number of Visits 24    Date for PT Re-Evaluation 08/15/23    Authorization Type [employer]    Progress Note Due on Visit 10    PT Start Time 1100    PT Stop Time 1144    PT Time Calculation (min) 44 min    Equipment Utilized During Treatment Gait belt    Activity Tolerance Patient tolerated treatment well    Behavior During Therapy WFL for tasks assessed/performed                   Past Medical History:  Diagnosis Date   Anxiety    CHF (congestive heart failure) (HCC)    COPD (chronic obstructive pulmonary disease) (HCC)    Depression    Dysrhythmia    Hyperlipidemia    Hypertension    Hypothyroidism    Sleep apnea    Past Surgical History:  Procedure Laterality Date   FRACTURE SURGERY     ORIF ANKLE FRACTURE Left 12/17/2020   Procedure: OPEN REDUCTION INTERNAL FIXATION (ORIF) ANKLE FRACTURE;  Surgeon: Gwyneth Revels, DPM;  Location: [facility];  Service: Podiatry;  Laterality: Left;   ORIF FEMUR FRACTURE Left 01/20/2023   Procedure: OPEN REDUCTION INTERNAL FIXATION (ORIF) DISTAL FEMUR FRACTURE;  Surgeon: Roby Lofts, MD;  Location: [facility];  Service: Orthopedics;  Laterality: Left;   Patient Active Problem List   Diagnosis Date Noted   Sepsis due to cellulitis (HCC) 06/11/2023   Paroxysmal atrial fibrillation (HCC) 06/11/2023   Closed left subtrochanteric femur fracture, initial encounter (HCC) 01/19/2023   CKD (chronic kidney disease) stage 4, GFR 15-29 ml/min (HCC) 01/26/2022   Generalized weakness 10/16/2021   Chronic respiratory failure with hypoxia (HCC) 10/16/2021   Hypotension    SIRS (systemic inflammatory response syndrome) (HCC)    AF (paroxysmal  atrial fibrillation) (HCC)    Syncope    Severe sepsis (HCC) 05/20/2021   Pressure injury of skin 04/25/2021   Acute respiratory failure (HCC) 04/21/2021   Acute kidney injury superimposed on CKD (HCC) 04/21/2021   Stage 3b chronic kidney disease (CKD) (HCC) 03/22/2021   Leukocytosis 03/22/2021   Osteomyelitis of ankle or foot, acute, left (HCC) 03/17/2021   Acute on chronic respiratory failure (HCC) 03/17/2021   Osteomyelitis (HCC) 12/16/2020   Depression 12/16/2020   Medicare annual wellness visit, initial 11/14/2019   B12 deficiency 11/14/2019   Hyperlipidemia 10/31/2019   Primary osteoarthritis of right knee 03/16/2019   Sleep apnea 10/31/2018   Traumatic incomplete tear of left rotator cuff 10/02/2018   Morbid obesity with BMI of 45.0-49.9, adult (HCC) 09/20/2018   Hypothyroidism 09/20/2018   Chronic diastolic CHF (congestive heart failure) (HCC) 09/20/2018   Benign essential hypertension 09/20/2018    ONSET DATE: 01/19/23  REFERRING DIAG: W09.811B (ICD-10-CM) - Closed comminuted supracondylar fracture of left femur (HCC)   THERAPY DIAG:  Difficulty in walking, not elsewhere classified  Muscle weakness (generalized)  Unsteadiness on feet  Other abnormalities of gait and mobility  Abnormality of gait and mobility  Rationale for Evaluation and Treatment: Rehabilitation  SUBJECTIVE:  SUBJECTIVE STATEMENT: Pt reports no significant changes since last session. No falls or LOB. Pt reported 0/10 on NPS.   Pt accompanied by: family member  PERTINENT HISTORY: ORIF of left supracondylar distal femur on 01/20/23. Went to SNF on 01/25/23 for rehab. Pt is here for femoral fracture. When pt was stepping out of the shower his leg gave out and he fell onto the floor. Pt is working on getting the  shower changed to make it a simple walk in shower. Pt  reports most of his falls are in the bathroom. Pt has a bar in the shower but is unable to have a bar on the shower itself due to the set up. Pt is generally inactive and does not walk much around the house, relies on the wheelchair for mobility. Pt wants to get out of the wheel chair for mobility, at least a little bit. When in rehab over the summer he was walking about 20-25 ft at a time.    PAIN:  Are you having pain? Yes: Pain location: left knee Aggravating factors: certain movements   PRECAUTIONS: Fall  RED FLAGS: None   WEIGHT BEARING RESTRICTIONS: No  FALLS: Has patient fallen in last 6 months? Yes. Number of falls 1  LIVING ENVIRONMENT: Lives with: lives with their family Lives in: House/apartment Stairs: Yes: Internal: 1 steps; none and External: 2 steps; can reach both Has following equipment at home: Dan Humphreys - 2 wheeled, Environmental consultant - 4 wheeled, Wheelchair (manual), shower chair, Grab bars, and grabber to pick things up  PLOF: Requires assistive device for independence, Needs assistance with ADLs, Needs assistance with homemaking, Needs assistance with gait, and Needs assistance with transfers  PATIENT GOALS: Improve his mobility.   OBJECTIVE:   DIAGNOSTIC FINDINGS:from 01/20/23:  " IMPRESSION: ORIF of comminuted distal femur fracture with improved fracture alignment from preoperative imaging"  COGNITION: Overall cognitive status: Within functional limits for tasks assessed   SENSATION: Not tested  COORDINATION: Not tested   EDEMA:  Not measured but B LE swelling seemed to be likely based on pt presentation    LOWER EXTREMITY ROM:     Active  Right Eval Left Eval  Hip flexion    Hip extension    Hip abduction    Hip adduction    Hip internal rotation    Hip external rotation    Knee flexion  95*  Knee extension Unable to achieve full extension in seated  Unable to achieve full extension in seated    Ankle dorsiflexion    Ankle plantarflexion    Ankle inversion    Ankle eversion     (Blank rows = not tested) *indicated measured in seated in WC and could not accurately ID bony landmarks.   LOWER EXTREMITY MMT:    MMT Right Eval Left Eval  Hip flexion 4- 3+  Hip extension    Hip abduction  4-*  Hip adduction  4-  Hip internal rotation 4 3+  Hip external rotation 4 3  Knee flexion 4 4-  Knee extension 4 4-  Ankle dorsiflexion    Ankle plantarflexion    Ankle inversion    Ankle eversion    (Blank rows = not tested)    TRANSFERS: Assistive device utilized: Environmental consultant - 2 wheeled  Sit to stand: Min A and DTR holds walker, pt uses heavy UE assist  Stand to sit: CGA Chair to chair: CGA and uses walker, close guarding Floor:  unable  RAMP:   Ramp Comments: uses  manual WC propulsion  CURB:   Curb Comments: uses manual WC propulsion  STAIRS:   Comments: not tested at eval, recomment testing at another time.   GAIT: Gait pattern:  left LE in hip ER throughout to make up for lack of ankle motion and strength. Heavy UE use on bari 2WW  Distance walked: 20 ft  Assistive device utilized: Environmental consultant - 2 wheeled and bariatric walker  Level of assistance: CGA and WC follow  Comments: decreased step length  FUNCTIONAL TESTS:  5 times sit to stand: 29 sec with heavy UE assist and walker Timed up and go (TUG): 88 sec walker 10 meter walk test: unable  PATIENT SURVEYS:  FOTO 28 RAG:49  TODAY'S TREATMENT 08/04/23:                                                                                                                          Therex   Ambulation with B RW and WC follow, good speed, no LOB noted  45 feet 66 feet  37 feet  LAQ 2 x 12 x 3 sec  with 3# AW   Heel raises on incline seated 3 x 15 with 3#  Seated march 2 x 12 with 3# AW   Seated step over half foam 2 x 10 ea LE ( hip abd/ IR then hip ADD /  ER) focus on foot positioning each rep with 3# AW   Seated  HS curl with GTB 2 x 12 ea LE   Nustep level 2 x 3 min  with B UE/LE Level 8 LE only 2 x 45 sec for LE strength and power      PATIENT EDUCATION: Education details: POC Person educated: Patient Education method: Explanation Education comprehension: verbalized understanding   HOME EXERCISE PROGRAM: Access Code: [email or web address] URL: [email or web address] Date: 05/30/2023 Prepared by: Thresa Ross  Exercises - Seated Long Arc Quad  - 1 x daily - 7 x weekly - 2 sets - 10 reps - 3 sec  hold - Seated March  - 1 x daily - 7 x weekly - 2 sets - 10 reps - Seated single leg step to side   - 1 x daily - 7 x weekly - 2 sets - 10 reps - Seated Heel Raise  - 1 x daily - 7 x weekly - 2 sets - 15 reps   GOALS: Goals reviewed with patient? Yes  SHORT TERM GOALS: Target date: 06/20/2023    Patient will be independent in home exercise program to improve strength/mobility for better functional independence with ADLs. Baseline: No HEP currently  Goal status: INITIAL   LONG TERM GOALS: Target date: 08/15/2023   1.  Patient (> 25 years old) will complete five times sit to stand test in <20 seconds and with UE support only on chair ( can use walker in standing for balance but cannot pull on walker) indicating an increased LE strength and improved balance. Baseline: 29 sec with heavy UE assist  and walker Goal status: INITIAL  2.  Patient will increase FOTO score to equal to or greater than  49   to demonstrate statistically significant improvement in mobility and quality of life.  Baseline: 28 Goal status: INITIAL    3.  Patient will reduce timed up and go to <11 seconds to reduce fall risk and demonstrate improved transfer/gait ability. Baseline: 88 sec with walker and with Heavy UE use to come to standing position  Goal status: INITIAL  4.   Patient will increase 10 meter walk test to > .5 m/s as to improve gait speed for better community ambulation and to reduce fall  risk. Baseline: Unable to ambulate this distance on eval  Goal status: INITIAL    ASSESSMENT:  CLINICAL IMPRESSION:    Pt tolerated all tasks during today's visit. Pt was able to ambulate increased distances, compared to last visit. [name] stated that she felt that this was the longest amount of distance the pt has ambulated in a long time. Pt reported fatigue, but felt accomplished after today's visit. Pt will continue to benefit from skilled physical therapy intervention to address impairments, improve QOL, and attain therapy goals.    OBJECTIVE IMPAIRMENTS: Abnormal gait, decreased activity tolerance, decreased balance, decreased endurance, decreased mobility, difficulty walking, decreased ROM, decreased strength, obesity, and pain.   ACTIVITY LIMITATIONS: carrying, lifting, bending, standing, squatting, stairs, transfers, bed mobility, bathing, toileting, dressing, and locomotion level  PARTICIPATION LIMITATIONS: driving, shopping, community activity, and yard work  PERSONAL FACTORS: Age, Behavior pattern, Fitness, Past/current experiences, Time since onset of injury/illness/exacerbation, and 3+ comorbidities: CHF, COPD, HLD, HTN  are also affecting patient's functional outcome.   REHAB POTENTIAL: Fair previous poor compliance with HEP and with therapy visits.   CLINICAL DECISION MAKING: Stable/uncomplicated  EVALUATION COMPLEXITY: Low  PLAN:  PT FREQUENCY: 2x/week  PT DURATION: 12 weeks  PLANNED INTERVENTIONS: Therapeutic exercises, Therapeutic activity, Neuromuscular re-education, Balance training, Gait training, Patient/Family education, Self Care, Joint mobilization, Stair training, and Prosthetic training  PLAN FOR NEXT SESSION: seated or supine therex mixed with closed chain strengthening as pt knee pain allows  Debara Pickett, SPT   Norman Herrlich, PT, DPT 08/04/2023, 3:13 PM

## 2023-08-09 ENCOUNTER — Encounter: Payer: Self-pay | Admitting: Physical Therapy

## 2023-08-09 ENCOUNTER — Ambulatory Visit: Payer: Medicare Other | Admitting: Physical Therapy

## 2023-08-09 DIAGNOSIS — R2689 Other abnormalities of gait and mobility: Secondary | ICD-10-CM

## 2023-08-09 DIAGNOSIS — R269 Unspecified abnormalities of gait and mobility: Secondary | ICD-10-CM

## 2023-08-09 DIAGNOSIS — R2681 Unsteadiness on feet: Secondary | ICD-10-CM

## 2023-08-09 DIAGNOSIS — R262 Difficulty in walking, not elsewhere classified: Secondary | ICD-10-CM

## 2023-08-09 DIAGNOSIS — M6281 Muscle weakness (generalized): Secondary | ICD-10-CM

## 2023-08-09 NOTE — Therapy (Signed)
OUTPATIENT PHYSICAL THERAPY NEURO TREATMENT/ Physical Therapy Progress Note   Dates of reporting period  05/23/23   to   08/09/23    Patient Name: Christopher [name] MRN: [id number] DOB:1950/11/12, 72 y.o., male Today's Date: 08/09/2023   PCP: Danella Penton, MD  REFERRING PROVIDER: Roby Lofts, MD    END OF SESSION:  PT End of Session - 08/09/23 1106     Visit Number 10    Number of Visits 24    Date for PT Re-Evaluation 08/15/23    Authorization Type [employer] Medicare    Progress Note Due on Visit 10    PT Start Time 1102    PT Stop Time 1144    PT Time Calculation (min) 42 min    Equipment Utilized During Treatment Gait belt    Activity Tolerance Patient tolerated treatment well    Behavior During Therapy WFL for tasks assessed/performed                    Past Medical History:  Diagnosis Date   Anxiety    CHF (congestive heart failure) (HCC)    COPD (chronic obstructive pulmonary disease) (HCC)    Depression    Dysrhythmia    Hyperlipidemia    Hypertension    Hypothyroidism    Sleep apnea    Past Surgical History:  Procedure Laterality Date   FRACTURE SURGERY     ORIF ANKLE FRACTURE Left 12/17/2020   Procedure: OPEN REDUCTION INTERNAL FIXATION (ORIF) ANKLE FRACTURE;  Surgeon: Gwyneth Revels, DPM;  Location: [facility];  Service: Podiatry;  Laterality: Left;   ORIF FEMUR FRACTURE Left 01/20/2023   Procedure: OPEN REDUCTION INTERNAL FIXATION (ORIF) DISTAL FEMUR FRACTURE;  Surgeon: Roby Lofts, MD;  Location: [facility];  Service: Orthopedics;  Laterality: Left;   Patient Active Problem List   Diagnosis Date Noted   Sepsis due to cellulitis (HCC) 06/11/2023   Paroxysmal atrial fibrillation (HCC) 06/11/2023   Closed left subtrochanteric femur fracture, initial encounter (HCC) 01/19/2023   CKD (chronic kidney disease) stage 4, GFR 15-29 ml/min (HCC) 01/26/2022   Generalized weakness 10/16/2021   Chronic respiratory failure with hypoxia (HCC) 10/16/2021    Hypotension    SIRS (systemic inflammatory response syndrome) (HCC)    AF (paroxysmal atrial fibrillation) (HCC)    Syncope    Severe sepsis (HCC) 05/20/2021   Pressure injury of skin 04/25/2021   Acute respiratory failure (HCC) 04/21/2021   Acute kidney injury superimposed on CKD (HCC) 04/21/2021   Stage 3b chronic kidney disease (CKD) (HCC) 03/22/2021   Leukocytosis 03/22/2021   Osteomyelitis of ankle or foot, acute, left (HCC) 03/17/2021   Acute on chronic respiratory failure (HCC) 03/17/2021   Osteomyelitis (HCC) 12/16/2020   Depression 12/16/2020   Medicare annual wellness visit, initial 11/14/2019   B12 deficiency 11/14/2019   Hyperlipidemia 10/31/2019   Primary osteoarthritis of right knee 03/16/2019   Sleep apnea 10/31/2018   Traumatic incomplete tear of left rotator cuff 10/02/2018   Morbid obesity with BMI of 45.0-49.9, adult (HCC) 09/20/2018   Hypothyroidism 09/20/2018   Chronic diastolic CHF (congestive heart failure) (HCC) 09/20/2018   Benign essential hypertension 09/20/2018    ONSET DATE: 01/19/23  REFERRING DIAG: X32.440N (ICD-10-CM) - Closed comminuted supracondylar fracture of left femur (HCC)   THERAPY DIAG:  Difficulty in walking, not elsewhere classified  Muscle weakness (generalized)  Unsteadiness on feet  Other abnormalities of gait and mobility  Abnormality of gait and mobility  Rationale for Evaluation and Treatment:  Rehabilitation  SUBJECTIVE:                                                                                                                                                                                             SUBJECTIVE STATEMENT: Pt reports no significant changes since last session. No falls or LOB.   Pt accompanied by: family member  PERTINENT HISTORY: ORIF of left supracondylar distal femur on 01/20/23. Went to SNF on 01/25/23 for rehab. Pt is here for femoral fracture. When pt was stepping out of the shower his leg  gave out and he fell onto the floor. Pt is working on getting the shower changed to make it a simple walk in shower. Pt  reports most of his falls are in the bathroom. Pt has a bar in the shower but is unable to have a bar on the shower itself due to the set up. Pt is generally inactive and does not walk much around the house, relies on the wheelchair for mobility. Pt wants to get out of the wheel chair for mobility, at least a little bit. When in rehab over the summer he was walking about 20-25 ft at a time.    PAIN:  Are you having pain? Yes: Pain location: left knee Aggravating factors: certain movements   PRECAUTIONS: Fall  RED FLAGS: None   WEIGHT BEARING RESTRICTIONS: No  FALLS: Has patient fallen in last 6 months? Yes. Number of falls 1  LIVING ENVIRONMENT: Lives with: lives with their family Lives in: House/apartment Stairs: Yes: Internal: 1 steps; none and External: 2 steps; can reach both Has following equipment at home: Dan Humphreys - 2 wheeled, Environmental consultant - 4 wheeled, Wheelchair (manual), shower chair, Grab bars, and grabber to pick things up  PLOF: Requires assistive device for independence, Needs assistance with ADLs, Needs assistance with homemaking, Needs assistance with gait, and Needs assistance with transfers  PATIENT GOALS: Improve his mobility.   OBJECTIVE:   DIAGNOSTIC FINDINGS:from 01/20/23:  " IMPRESSION: ORIF of comminuted distal femur fracture with improved fracture alignment from preoperative imaging"  COGNITION: Overall cognitive status: Within functional limits for tasks assessed   SENSATION: Not tested  COORDINATION: Not tested   EDEMA:  Not measured but B LE swelling seemed to be likely based on pt presentation    LOWER EXTREMITY ROM:     Active  Right Eval Left Eval  Hip flexion    Hip extension    Hip abduction    Hip adduction    Hip internal rotation    Hip external rotation    Knee flexion  95*  Knee extension Unable to  achieve full  extension in seated  Unable to achieve full extension in seated   Ankle dorsiflexion    Ankle plantarflexion    Ankle inversion    Ankle eversion     (Blank rows = not tested) *indicated measured in seated in WC and could not accurately ID bony landmarks.   LOWER EXTREMITY MMT:    MMT Right Eval Left Eval  Hip flexion 4- 3+  Hip extension    Hip abduction  4-*  Hip adduction  4-  Hip internal rotation 4 3+  Hip external rotation 4 3  Knee flexion 4 4-  Knee extension 4 4-  Ankle dorsiflexion    Ankle plantarflexion    Ankle inversion    Ankle eversion    (Blank rows = not tested)    TRANSFERS: Assistive device utilized: Environmental consultant - 2 wheeled  Sit to stand: Min A and DTR holds walker, pt uses heavy UE assist  Stand to sit: CGA Chair to chair: CGA and uses walker, close guarding Floor:  unable  RAMP:   Ramp Comments: uses manual WC propulsion  CURB:   Curb Comments: uses manual WC propulsion  STAIRS:   Comments: not tested at eval, recomment testing at another time.   GAIT: Gait pattern:  left LE in hip ER throughout to make up for lack of ankle motion and strength. Heavy UE use on bari 2WW  Distance walked: 20 ft  Assistive device utilized: Environmental consultant - 2 wheeled and bariatric walker  Level of assistance: CGA and WC follow  Comments: decreased step length  FUNCTIONAL TESTS:  5 times sit to stand: 29 sec with heavy UE assist and walker Timed up and go (TUG): 88 sec walker 10 meter walk test: unable  PATIENT SURVEYS:  FOTO 28 RAG:49  TODAY'S TREATMENT 08/09/23:                                                                                                                           Physical therapy treatment session today consisted of completing assessment of goals and administration of testing as demonstrated and documented in flow sheet, treatment, and goals section of this note. Addition treatments may be found below.   Therex  LAQ 2 x 12 x 3 sec  with 3#  AW   Heel raises on incline seated 3 x 15 with 3#  Seated march 2 x 12 with 3# AW   Ambulation x 40 ft ( in addition to )   PATIENT EDUCATION: Education details: POC Person educated: Patient Education method: Explanation Education comprehension: verbalized understanding   HOME EXERCISE PROGRAM: Access Code: [email or web address] URL: [email or web address] Date: 05/30/2023 Prepared by: Thresa Ross  Exercises - Seated Long Arc Quad  - 1 x daily - 7 x weekly - 2 sets - 10 reps - 3 sec  hold - Seated March  - 1 x daily - 7 x weekly - 2 sets - 10 reps - Seated single leg step to  side   - 1 x daily - 7 x weekly - 2 sets - 10 reps - Seated Heel Raise  - 1 x daily - 7 x weekly - 2 sets - 15 reps   GOALS: Goals reviewed with patient? Yes  SHORT TERM GOALS: Target date: 06/20/2023    Patient will be independent in home exercise program to improve strength/mobility for better functional independence with ADLs. Baseline: 12/17: completing once per week Goal status: ONGOING   LONG TERM GOALS: Target date: 08/15/2023   1.  Patient (> 26 years old) will complete five times sit to stand test in <20 seconds and with UE support only on chair ( can use walker in standing for balance but cannot pull on walker) indicating an increased LE strength and improved balance. Baseline: 29 sec with heavy UE assist and walker 12/17:26.45, UE assist  Goal status: INITIAL  2.  Patient will increase FOTO score to equal to or greater than  49   to demonstrate statistically significant improvement in mobility and quality of life.  Baseline: 28 12/17: 48 Goal status: ONGOING    3.  Patient will reduce timed up and go to <11 seconds to reduce fall risk and demonstrate improved transfer/gait ability. Baseline: 88 sec with walker and with Heavy UE use to come to standing position  12/17: 40.35 sec  Goal status: ONGOING  4.   Patient will increase 10 meter walk test to > .5 m/s as to  improve gait speed for better community ambulation and to reduce fall risk. Baseline: Unable to ambulate this distance on eval  12/17: 20.7 sec  Goal status: ONGOING    ASSESSMENT:  CLINICAL IMPRESSION:    Pt tolerated all tasks during today's visit. Pt shows improvement towards all of his long term goals. Pt encouraged to participate with greater frequency with his HEP. Pt still limited with his overall mobility at this time. Pt will continue to benefit from skilled physical therapy intervention to address impairments, improve QOL, and attain therapy goals.    OBJECTIVE IMPAIRMENTS: Abnormal gait, decreased activity tolerance, decreased balance, decreased endurance, decreased mobility, difficulty walking, decreased ROM, decreased strength, obesity, and pain.   ACTIVITY LIMITATIONS: carrying, lifting, bending, standing, squatting, stairs, transfers, bed mobility, bathing, toileting, dressing, and locomotion level  PARTICIPATION LIMITATIONS: driving, shopping, community activity, and yard work  PERSONAL FACTORS: Age, Behavior pattern, Fitness, Past/current experiences, Time since onset of injury/illness/exacerbation, and 3+ comorbidities: CHF, COPD, HLD, HTN  are also affecting patient's functional outcome.   REHAB POTENTIAL: Fair previous poor compliance with HEP and with therapy visits.   CLINICAL DECISION MAKING: Stable/uncomplicated  EVALUATION COMPLEXITY: Low  PLAN:  PT FREQUENCY: 2x/week  PT DURATION: 12 weeks  PLANNED INTERVENTIONS: Therapeutic exercises, Therapeutic activity, Neuromuscular re-education, Balance training, Gait training, Patient/Family education, Self Care, Joint mobilization, Stair training, and Prosthetic training  PLAN FOR NEXT SESSION: seated or supine therex mixed with closed chain strengthening as pt knee pain allows   Norman Herrlich, PT, DPT 08/09/2023, 11:37 AM

## 2023-08-11 ENCOUNTER — Ambulatory Visit: Payer: Medicare Other | Admitting: Physical Therapy

## 2023-08-11 ENCOUNTER — Encounter: Payer: Self-pay | Admitting: Physical Therapy

## 2023-08-11 DIAGNOSIS — M6281 Muscle weakness (generalized): Secondary | ICD-10-CM

## 2023-08-11 DIAGNOSIS — R262 Difficulty in walking, not elsewhere classified: Secondary | ICD-10-CM

## 2023-08-11 DIAGNOSIS — R2689 Other abnormalities of gait and mobility: Secondary | ICD-10-CM

## 2023-08-11 DIAGNOSIS — R269 Unspecified abnormalities of gait and mobility: Secondary | ICD-10-CM

## 2023-08-11 DIAGNOSIS — R2681 Unsteadiness on feet: Secondary | ICD-10-CM

## 2023-08-11 NOTE — Therapy (Signed)
OUTPATIENT PHYSICAL THERAPY NEURO TREATMENT    Patient Name: Christopher [name] MRN: [id number] DOB:[birth year], 72 y.o., male Today's Date: 08/11/2023   PCP: Danella Penton, MD  REFERRING PROVIDER: Roby Lofts, MD    END OF SESSION:  PT End of Session - 08/11/23 1137     Visit Number 11    Number of Visits 24    Date for PT Re-Evaluation 08/15/23    Authorization Type [employer]    Progress Note Due on Visit 10    PT Start Time 1119    PT Stop Time 1144    PT Time Calculation (min) 25 min    Equipment Utilized During Treatment Gait belt    Activity Tolerance Patient tolerated treatment well    Behavior During Therapy WFL for tasks assessed/performed                    Past Medical History:  Diagnosis Date   Anxiety    CHF (congestive heart failure) (HCC)    COPD (chronic obstructive pulmonary disease) (HCC)    Depression    Dysrhythmia    Hyperlipidemia    Hypertension    Hypothyroidism    Sleep apnea    Past Surgical History:  Procedure Laterality Date   FRACTURE SURGERY     ORIF ANKLE FRACTURE Left 12/17/2020   Procedure: OPEN REDUCTION INTERNAL FIXATION (ORIF) ANKLE FRACTURE;  Surgeon: Gwyneth Revels, DPM;  Location: [facility] ORS;  Service: Podiatry;  Laterality: Left;   ORIF FEMUR FRACTURE Left 01/20/2023   Procedure: OPEN REDUCTION INTERNAL FIXATION (ORIF) DISTAL FEMUR FRACTURE;  Surgeon: Roby Lofts, MD;  Location: [facility];  Service: Orthopedics;  Laterality: Left;   Patient Active Problem List   Diagnosis Date Noted   Sepsis due to cellulitis (HCC) 06/11/2023   Paroxysmal atrial fibrillation (HCC) 06/11/2023   Closed left subtrochanteric femur fracture, initial encounter (HCC) 01/19/2023   CKD (chronic kidney disease) stage 4, GFR 15-29 ml/min (HCC) 01/26/2022   Generalized weakness 10/16/2021   Chronic respiratory failure with hypoxia (HCC) 10/16/2021   Hypotension    SIRS (systemic inflammatory response syndrome) (HCC)    AF  (paroxysmal atrial fibrillation) (HCC)    Syncope    Severe sepsis (HCC) 05/20/2021   Pressure injury of skin 04/25/2021   Acute respiratory failure (HCC) 04/21/2021   Acute kidney injury superimposed on CKD (HCC) 04/21/2021   Stage 3b chronic kidney disease (CKD) (HCC) 03/22/2021   Leukocytosis 03/22/2021   Osteomyelitis of ankle or foot, acute, left (HCC) 03/17/2021   Acute on chronic respiratory failure (HCC) 03/17/2021   Osteomyelitis (HCC) 12/16/2020   Depression 12/16/2020   Medicare annual wellness visit, initial 11/14/2019   B12 deficiency 11/14/2019   Hyperlipidemia 10/31/2019   Primary osteoarthritis of right knee 03/16/2019   Sleep apnea 10/31/2018   Traumatic incomplete tear of left rotator cuff 10/02/2018   Morbid obesity with BMI of 45.0-49.9, adult (HCC) 09/20/2018   Hypothyroidism 09/20/2018   Chronic diastolic CHF (congestive heart failure) (HCC) 09/20/2018   Benign essential hypertension 09/20/2018    ONSET DATE: 01/19/23  REFERRING DIAG: W09.811B (ICD-10-CM) - Closed comminuted supracondylar fracture of left femur (HCC)   THERAPY DIAG:  Difficulty in walking, not elsewhere classified  Muscle weakness (generalized)  Unsteadiness on feet  Other abnormalities of gait and mobility  Abnormality of gait and mobility  Rationale for Evaluation and Treatment: Rehabilitation  SUBJECTIVE:  SUBJECTIVE STATEMENT: Pt reports no significant changes since last session. No falls or LOB.   Pt accompanied by: family member  PERTINENT HISTORY: ORIF of left supracondylar distal femur on 01/20/23. Went to SNF on 01/25/23 for rehab. Pt is here for femoral fracture. When pt was stepping out of the shower his leg gave out and he fell onto the floor. Pt is working on getting the shower changed  to make it a simple walk in shower. Pt  reports most of his falls are in the bathroom. Pt has a bar in the shower but is unable to have a bar on the shower itself due to the set up. Pt is generally inactive and does not walk much around the house, relies on the wheelchair for mobility. Pt wants to get out of the wheel chair for mobility, at least a little bit. When in rehab over the summer he was walking about 20-25 ft at a time.    PAIN:  Are you having pain? Yes: Pain location: left knee Aggravating factors: certain movements   PRECAUTIONS: Fall  RED FLAGS: None   WEIGHT BEARING RESTRICTIONS: No  FALLS: Has patient fallen in last 6 months? Yes. Number of falls 1  LIVING ENVIRONMENT: Lives with: lives with their family Lives in: House/apartment Stairs: Yes: Internal: 1 steps; none and External: 2 steps; can reach both Has following equipment at home: Dan Humphreys - 2 wheeled, Environmental consultant - 4 wheeled, Wheelchair (manual), shower chair, Grab bars, and grabber to pick things up  PLOF: Requires assistive device for independence, Needs assistance with ADLs, Needs assistance with homemaking, Needs assistance with gait, and Needs assistance with transfers  PATIENT GOALS: Improve his mobility.   OBJECTIVE:   DIAGNOSTIC FINDINGS:from 01/20/23:  " IMPRESSION: ORIF of comminuted distal femur fracture with improved fracture alignment from preoperative imaging"  COGNITION: Overall cognitive status: Within functional limits for tasks assessed   SENSATION: Not tested  COORDINATION: Not tested   EDEMA:  Not measured but B LE swelling seemed to be likely based on pt presentation    LOWER EXTREMITY ROM:     Active  Right Eval Left Eval  Hip flexion    Hip extension    Hip abduction    Hip adduction    Hip internal rotation    Hip external rotation    Knee flexion  95*  Knee extension Unable to achieve full extension in seated  Unable to achieve full extension in seated   Ankle  dorsiflexion    Ankle plantarflexion    Ankle inversion    Ankle eversion     (Blank rows = not tested) *indicated measured in seated in WC and could not accurately ID bony landmarks.   LOWER EXTREMITY MMT:    MMT Right Eval Left Eval  Hip flexion 4- 3+  Hip extension    Hip abduction  4-*  Hip adduction  4-  Hip internal rotation 4 3+  Hip external rotation 4 3  Knee flexion 4 4-  Knee extension 4 4-  Ankle dorsiflexion    Ankle plantarflexion    Ankle inversion    Ankle eversion    (Blank rows = not tested)    TRANSFERS: Assistive device utilized: Environmental consultant - 2 wheeled  Sit to stand: Min A and DTR holds walker, pt uses heavy UE assist  Stand to sit: CGA Chair to chair: CGA and uses walker, close guarding Floor:  unable  RAMP:   Ramp Comments: uses manual WC propulsion  CURB:  Curb Comments: uses manual WC propulsion  STAIRS:   Comments: not tested at eval, recomment testing at another time.   GAIT: Gait pattern:  left LE in hip ER throughout to make up for lack of ankle motion and strength. Heavy UE use on bari 2WW  Distance walked: 20 ft  Assistive device utilized: Environmental consultant - 2 wheeled and bariatric walker  Level of assistance: CGA and WC follow  Comments: decreased step length  FUNCTIONAL TESTS:  5 times sit to stand: 29 sec with heavy UE assist and walker Timed up and go (TUG): 88 sec walker 10 meter walk test: unable  PATIENT SURVEYS:  FOTO 28 RAG:49  TODAY'S TREATMENT 08/11/23:                                                                                                                          Pt session was cut a little short today due to pt arriving late to scheduled appointment time   Therex   Ambulation with BRW 2 x 65 ft   LAQ 2 x 12 x 3 sec  with 3# AW   Heel raises on incline seated 2 x 15 with 3#  Seated march 2 x 12 with 3# AW    PATIENT EDUCATION: Education details: POC Person educated: Patient Education method:  Explanation Education comprehension: verbalized understanding   HOME EXERCISE PROGRAM: Access Code: [email or web address] URL: [email or web address] Date: 05/30/2023 Prepared by: Thresa Ross  Exercises - Seated Long Arc Quad  - 1 x daily - 7 x weekly - 2 sets - 10 reps - 3 sec  hold - Seated March  - 1 x daily - 7 x weekly - 2 sets - 10 reps - Seated single leg step to side   - 1 x daily - 7 x weekly - 2 sets - 10 reps - Seated Heel Raise  - 1 x daily - 7 x weekly - 2 sets - 15 reps   GOALS: Goals reviewed with patient? Yes  SHORT TERM GOALS: Target date: 06/20/2023    Patient will be independent in home exercise program to improve strength/mobility for better functional independence with ADLs. Baseline: 12/17: completing once per week Goal status: ONGOING   LONG TERM GOALS: Target date: 08/15/2023   1.  Patient (> 3 years old) will complete five times sit to stand test in <20 seconds and with UE support only on chair ( can use walker in standing for balance but cannot pull on walker) indicating an increased LE strength and improved balance. Baseline: 29 sec with heavy UE assist and walker 12/17:26.45, UE assist  Goal status: INITIAL  2.  Patient will increase FOTO score to equal to or greater than  49   to demonstrate statistically significant improvement in mobility and quality of life.  Baseline: 28 12/17: 48 Goal status: ONGOING    3.  Patient will reduce timed up and go to <11 seconds to reduce fall risk and demonstrate improved transfer/gait  ability. Baseline: 88 sec with walker and with Heavy UE use to come to standing position  12/17: 40.35 sec  Goal status: ONGOING  4.   Patient will increase 10 meter walk test to > .5 m/s as to improve gait speed for better community ambulation and to reduce fall risk. Baseline: Unable to ambulate this distance on eval  12/17: 20.7 sec  Goal status: ONGOING    ASSESSMENT:  CLINICAL IMPRESSION:    Pt tolerated  all tasks during today's visit.  Patient is late to today's session so therapeutic interventions were limited secondary to time constraints.  Patient continuing to show improvement with ambulation tolerance no longer requiring a wheelchair follow but still very not confident with this activity and requires encouragement to improve his confidence.  Pt will continue to benefit from skilled physical therapy intervention to address impairments, improve QOL, and attain therapy goals.    OBJECTIVE IMPAIRMENTS: Abnormal gait, decreased activity tolerance, decreased balance, decreased endurance, decreased mobility, difficulty walking, decreased ROM, decreased strength, obesity, and pain.   ACTIVITY LIMITATIONS: carrying, lifting, bending, standing, squatting, stairs, transfers, bed mobility, bathing, toileting, dressing, and locomotion level  PARTICIPATION LIMITATIONS: driving, shopping, community activity, and yard work  PERSONAL FACTORS: Age, Behavior pattern, Fitness, Past/current experiences, Time since onset of injury/illness/exacerbation, and 3+ comorbidities: CHF, COPD, HLD, HTN  are also affecting patient's functional outcome.   REHAB POTENTIAL: Fair previous poor compliance with HEP and with therapy visits.   CLINICAL DECISION MAKING: Stable/uncomplicated  EVALUATION COMPLEXITY: Low  PLAN:  PT FREQUENCY: 2x/week  PT DURATION: 12 weeks  PLANNED INTERVENTIONS: Therapeutic exercises, Therapeutic activity, Neuromuscular re-education, Balance training, Gait training, Patient/Family education, Self Care, Joint mobilization, Stair training, and Prosthetic training  PLAN FOR NEXT SESSION: seated or supine therex mixed with closed chain strengthening as pt knee pain allows   Norman Herrlich, PT, DPT 08/11/2023, 3:04 PM

## 2023-08-15 NOTE — Therapy (Signed)
[facility] Outpatient Rehabilitation at [facility] [address] [place], [place], [address] Phone: [phone]   Fax:  [phone]  Patient Details  Name: Christopher [name] MRN: [id number] Date of Birth: August 18, 1951 Referring Provider:  Danella Penton, MD  Encounter Date: 06/08/2023 Note open and air when patient had not arrived and patient ended up canceling appointment.  No charge or visit count or visit or patient interaction associated with this note.  Norman Herrlich, PT 08/15/2023, 3:33 PM  [place] [facility] Outpatient Rehabilitation at [facility] [address] [place], [place], [address] Phone: [phone]   Fax:  [phone]

## 2023-08-18 ENCOUNTER — Ambulatory Visit: Payer: Medicare Other | Admitting: Physical Therapy

## 2023-08-25 ENCOUNTER — Ambulatory Visit: Payer: Medicare Other | Admitting: Physical Therapy

## 2023-08-25 NOTE — Therapy (Deleted)
 OUTPATIENT PHYSICAL THERAPY NEURO TREATMENT/ RECERT ***    Patient Name: Christopher [name] MRN: [id number] DOB:1951-08-18, 73 y.o., male Today's Date: 08/25/2023   PCP: Cleotilde Oneil FALCON, MD  REFERRING PROVIDER: Kendal Franky SQUIBB, MD    END OF SESSION:           Past Medical History:  Diagnosis Date   Anxiety    CHF (congestive heart failure) (HCC)    COPD (chronic obstructive pulmonary disease) (HCC)    Depression    Dysrhythmia    Hyperlipidemia    Hypertension    Hypothyroidism    Sleep apnea    Past Surgical History:  Procedure Laterality Date   FRACTURE SURGERY     ORIF ANKLE FRACTURE Left 12/17/2020   Procedure: OPEN REDUCTION INTERNAL FIXATION (ORIF) ANKLE FRACTURE;  Surgeon: Ashley Soulier, DPM;  Location: [facility] ORS;  Service: Podiatry;  Laterality: Left;   ORIF FEMUR FRACTURE Left 01/20/2023   Procedure: OPEN REDUCTION INTERNAL FIXATION (ORIF) DISTAL FEMUR FRACTURE;  Surgeon: Kendal Franky SQUIBB, MD;  Location: [facility] OR;  Service: Orthopedics;  Laterality: Left;   Patient Active Problem List   Diagnosis Date Noted   Sepsis due to cellulitis (HCC) 06/11/2023   Paroxysmal atrial fibrillation (HCC) 06/11/2023   Closed left subtrochanteric femur fracture, initial encounter (HCC) 01/19/2023   CKD (chronic kidney disease) stage 4, GFR 15-29 ml/min (HCC) 01/26/2022   Generalized weakness 10/16/2021   Chronic respiratory failure with hypoxia (HCC) 10/16/2021   Hypotension    SIRS (systemic inflammatory response syndrome) (HCC)    AF (paroxysmal atrial fibrillation) (HCC)    Syncope    Severe sepsis (HCC) 05/20/2021   Pressure injury of skin 04/25/2021   Acute respiratory failure (HCC) 04/21/2021   Acute kidney injury superimposed on CKD (HCC) 04/21/2021   Stage 3b chronic kidney disease (CKD) (HCC) 03/22/2021   Leukocytosis 03/22/2021   Osteomyelitis of ankle or foot, acute, left (HCC) 03/17/2021   Acute on chronic respiratory failure (HCC) 03/17/2021   Osteomyelitis  (HCC) 12/16/2020   Depression 12/16/2020   Medicare annual wellness visit, initial 11/14/2019   B12 deficiency 11/14/2019   Hyperlipidemia 10/31/2019   Primary osteoarthritis of right knee 03/16/2019   Sleep apnea 10/31/2018   Traumatic incomplete tear of left rotator cuff 10/02/2018   Morbid obesity with BMI of 45.0-49.9, adult (HCC) 09/20/2018   Hypothyroidism 09/20/2018   Chronic diastolic CHF (congestive heart failure) (HCC) 09/20/2018   Benign essential hypertension 09/20/2018    ONSET DATE: 01/19/23  REFERRING DIAG: D27.547J (ICD-10-CM) - Closed comminuted supracondylar fracture of left femur (HCC)   THERAPY DIAG:  No diagnosis found.  Rationale for Evaluation and Treatment: Rehabilitation  SUBJECTIVE:  SUBJECTIVE STATEMENT: Pt reports no significant changes since last session. No falls or LOB.   Pt accompanied by: family member  PERTINENT HISTORY: ORIF of left supracondylar distal femur on 01/20/23. Went to SNF on 01/25/23 for rehab. Pt is here for femoral fracture. When pt was stepping out of the shower his leg gave out and he fell onto the floor. Pt is working on getting the shower changed to make it a simple walk in shower. Pt  reports most of his falls are in the bathroom. Pt has a bar in the shower but is unable to have a bar on the shower itself due to the set up. Pt is generally inactive and does not walk much around the house, relies on the wheelchair for mobility. Pt wants to get out of the wheel chair for mobility, at least a little bit. When in rehab over the summer he was walking about 20-25 ft at a time.    PAIN:  Are you having pain? Yes: Pain location: left knee Aggravating factors: certain movements   PRECAUTIONS: Fall  RED FLAGS: None   WEIGHT BEARING RESTRICTIONS:  No  FALLS: Has patient fallen in last 6 months? Yes. Number of falls 1  LIVING ENVIRONMENT: Lives with: lives with their family Lives in: House/apartment Stairs: Yes: Internal: 1 steps; none and External: 2 steps; can reach both Has following equipment at home: Vannie - 2 wheeled, Environmental Consultant - 4 wheeled, Wheelchair (manual), shower chair, Grab bars, and grabber to pick things up  PLOF: Requires assistive device for independence, Needs assistance with ADLs, Needs assistance with homemaking, Needs assistance with gait, and Needs assistance with transfers  PATIENT GOALS: Improve his mobility.   OBJECTIVE:   DIAGNOSTIC FINDINGS:from 01/20/23:   IMPRESSION: ORIF of comminuted distal femur fracture with improved fracture alignment from preoperative imaging  COGNITION: Overall cognitive status: Within functional limits for tasks assessed   SENSATION: Not tested  COORDINATION: Not tested   EDEMA:  Not measured but B LE swelling seemed to be likely based on pt presentation    LOWER EXTREMITY ROM:     Active  Right Eval Left Eval  Hip flexion    Hip extension    Hip abduction    Hip adduction    Hip internal rotation    Hip external rotation    Knee flexion  95*  Knee extension Unable to achieve full extension in seated  Unable to achieve full extension in seated   Ankle dorsiflexion    Ankle plantarflexion    Ankle inversion    Ankle eversion     (Blank rows = not tested) *indicated measured in seated in WC and could not accurately ID bony landmarks.   LOWER EXTREMITY MMT:    MMT Right Eval Left Eval  Hip flexion 4- 3+  Hip extension    Hip abduction  4-*  Hip adduction  4-  Hip internal rotation 4 3+  Hip external rotation 4 3  Knee flexion 4 4-  Knee extension 4 4-  Ankle dorsiflexion    Ankle plantarflexion    Ankle inversion    Ankle eversion    (Blank rows = not tested)    TRANSFERS: Assistive device utilized: Environmental Consultant - 2 wheeled  Sit to stand: Min  A and DTR holds walker, pt uses heavy UE assist  Stand to sit: CGA Chair to chair: CGA and uses walker, close guarding Floor:  unable  RAMP:   Ramp Comments: uses manual WC propulsion  CURB:  Curb Comments: uses manual WC propulsion  STAIRS:   Comments: not tested at eval, recomment testing at another time.   GAIT: Gait pattern:  left LE in hip ER throughout to make up for lack of ankle motion and strength. Heavy UE use on bari 2WW  Distance walked: 20 ft  Assistive device utilized: Environmental Consultant - 2 wheeled and bariatric walker  Level of assistance: CGA and WC follow  Comments: decreased step length  FUNCTIONAL TESTS:  5 times sit to stand: 29 sec with heavy UE assist and walker Timed up and go (TUG): 88 sec walker 10 meter walk test: unable  PATIENT SURVEYS:  FOTO 28 RAG:49  TODAY'S TREATMENT 08/25/23:                                                                                                                          Pt session was cut a little short today due to pt arriving late to scheduled appointment time   Therex   Ambulation with BRW 2 x 65 ft   LAQ 2 x 12 x 3 sec  with 3# AW   Heel raises on incline seated 2 x 15 with 3#  Seated march 2 x 12 with 3# AW    PATIENT EDUCATION: Education details: POC Person educated: Patient Education method: Explanation Education comprehension: verbalized understanding   HOME EXERCISE PROGRAM: Access Code: [email or web address] URL: [email or web address] Date: 05/30/2023 Prepared by: Lonni Gainer  Exercises - Seated Long Arc Quad  - 1 x daily - 7 x weekly - 2 sets - 10 reps - 3 sec  hold - Seated March  - 1 x daily - 7 x weekly - 2 sets - 10 reps - Seated single leg step to side   - 1 x daily - 7 x weekly - 2 sets - 10 reps - Seated Heel Raise  - 1 x daily - 7 x weekly - 2 sets - 15 reps   GOALS: Goals reviewed with patient? Yes  SHORT TERM GOALS: Target date: 06/20/2023    Patient will be independent  in home exercise program to improve strength/mobility for better functional independence with ADLs. Baseline: 12/17: completing once per week Goal status: ONGOING   LONG TERM GOALS: Target date: 08/15/2023   1.  Patient (> 39 years old) will complete five times sit to stand test in <20 seconds and with UE support only on chair ( can use walker in standing for balance but cannot pull on walker) indicating an increased LE strength and improved balance. Baseline: 29 sec with heavy UE assist and walker 12/17:26.45, UE assist  Goal status: INITIAL  2.  Patient will increase FOTO score to equal to or greater than  49   to demonstrate statistically significant improvement in mobility and quality of life.  Baseline: 28 12/17: 48 Goal status: ONGOING    3.  Patient will reduce timed up and go to <11 seconds to reduce fall risk and demonstrate improved transfer/gait  ability. Baseline: 88 sec with walker and with Heavy UE use to come to standing position  12/17: 40.35 sec  Goal status: ONGOING  4.   Patient will increase 10 meter walk test to > .5 m/s as to improve gait speed for better community ambulation and to reduce fall risk. Baseline: Unable to ambulate this distance on eval  12/17: 20.7 sec  Goal status: ONGOING    ASSESSMENT:  CLINICAL IMPRESSION:    Pt tolerated all tasks during today's visit.  Patient is late to today's session so therapeutic interventions were limited secondary to time constraints.  Patient continuing to show improvement with ambulation tolerance no longer requiring a wheelchair follow but still very not confident with this activity and requires encouragement to improve his confidence.  Pt will continue to benefit from skilled physical therapy intervention to address impairments, improve QOL, and attain therapy goals.    OBJECTIVE IMPAIRMENTS: Abnormal gait, decreased activity tolerance, decreased balance, decreased endurance, decreased mobility, difficulty  walking, decreased ROM, decreased strength, obesity, and pain.   ACTIVITY LIMITATIONS: carrying, lifting, bending, standing, squatting, stairs, transfers, bed mobility, bathing, toileting, dressing, and locomotion level  PARTICIPATION LIMITATIONS: driving, shopping, community activity, and yard work  PERSONAL FACTORS: Age, Behavior pattern, Fitness, Past/current experiences, Time since onset of injury/illness/exacerbation, and 3+ comorbidities: CHF, COPD, HLD, HTN  are also affecting patient's functional outcome.   REHAB POTENTIAL: Fair previous poor compliance with HEP and with therapy visits.   CLINICAL DECISION MAKING: Stable/uncomplicated  EVALUATION COMPLEXITY: Low  PLAN:  PT FREQUENCY: 2x/week  PT DURATION: 12 weeks  PLANNED INTERVENTIONS: Therapeutic exercises, Therapeutic activity, Neuromuscular re-education, Balance training, Gait training, Patient/Family education, Self Care, Joint mobilization, Stair training, and Prosthetic training  PLAN FOR NEXT SESSION: seated or supine therex mixed with closed chain strengthening as pt knee pain allows   Lonni KATHEE Gainer, PT, DPT 08/25/2023, 8:02 AM

## 2023-08-30 ENCOUNTER — Ambulatory Visit: Payer: Medicare Other | Admitting: Physical Therapy

## 2023-09-01 ENCOUNTER — Ambulatory Visit: Payer: Medicare Other | Admitting: Physical Therapy

## 2023-09-06 ENCOUNTER — Ambulatory Visit: Payer: Medicare Other | Admitting: Physical Therapy

## 2023-09-08 ENCOUNTER — Ambulatory Visit: Payer: Medicare Other | Attending: Student | Admitting: Physical Therapy

## 2023-09-08 ENCOUNTER — Encounter: Payer: Self-pay | Admitting: Physical Therapy

## 2023-09-08 DIAGNOSIS — R2689 Other abnormalities of gait and mobility: Secondary | ICD-10-CM | POA: Insufficient documentation

## 2023-09-08 DIAGNOSIS — R262 Difficulty in walking, not elsewhere classified: Secondary | ICD-10-CM | POA: Diagnosis present

## 2023-09-08 DIAGNOSIS — R2681 Unsteadiness on feet: Secondary | ICD-10-CM | POA: Diagnosis present

## 2023-09-08 DIAGNOSIS — M6281 Muscle weakness (generalized): Secondary | ICD-10-CM | POA: Diagnosis present

## 2023-09-08 DIAGNOSIS — R269 Unspecified abnormalities of gait and mobility: Secondary | ICD-10-CM | POA: Diagnosis present

## 2023-09-08 NOTE — Therapy (Signed)
OUTPATIENT PHYSICAL THERAPY NEURO TREATMENT/ Re-certification   Patient Name: Christopher [name] MRN: [id number] DOB:08-15-1951, 73 y.o., male Today's Date: 09/08/2023   PCP: Danella Penton, MD  REFERRING PROVIDER: Roby Lofts, MD    END OF SESSION:  PT End of Session - 09/08/23 1142     Visit Number 12    Number of Visits 28    Date for PT Re-Evaluation 11/04/23    Authorization Type [employer]    Progress Note Due on Visit 10    PT Start Time 1147    PT Stop Time 1230    PT Time Calculation (min) 43 min    Equipment Utilized During Treatment Gait belt    Activity Tolerance Patient tolerated treatment well    Behavior During Therapy WFL for tasks assessed/performed                    Past Medical History:  Diagnosis Date   Anxiety    CHF (congestive heart failure) (HCC)    COPD (chronic obstructive pulmonary disease) (HCC)    Depression    Dysrhythmia    Hyperlipidemia    Hypertension    Hypothyroidism    Sleep apnea    Past Surgical History:  Procedure Laterality Date   FRACTURE SURGERY     ORIF ANKLE FRACTURE Left 12/17/2020   Procedure: OPEN REDUCTION INTERNAL FIXATION (ORIF) ANKLE FRACTURE;  Surgeon: Gwyneth Revels, DPM;  Location: [facility];  Service: Podiatry;  Laterality: Left;   ORIF FEMUR FRACTURE Left 01/20/2023   Procedure: OPEN REDUCTION INTERNAL FIXATION (ORIF) DISTAL FEMUR FRACTURE;  Surgeon: Roby Lofts, MD;  Location: [facility] OR;  Service: Orthopedics;  Laterality: Left;   Patient Active Problem List   Diagnosis Date Noted   Sepsis due to cellulitis (HCC) 06/11/2023   Paroxysmal atrial fibrillation (HCC) 06/11/2023   Closed left subtrochanteric femur fracture, initial encounter (HCC) 01/19/2023   CKD (chronic kidney disease) stage 4, GFR 15-29 ml/min (HCC) 01/26/2022   Generalized weakness 10/16/2021   Chronic respiratory failure with hypoxia (HCC) 10/16/2021   Hypotension    SIRS (systemic inflammatory response syndrome) (HCC)     AF (paroxysmal atrial fibrillation) (HCC)    Syncope    Severe sepsis (HCC) 05/20/2021   Pressure injury of skin 04/25/2021   Acute respiratory failure (HCC) 04/21/2021   Acute kidney injury superimposed on CKD (HCC) 04/21/2021   Stage 3b chronic kidney disease (CKD) (HCC) 03/22/2021   Leukocytosis 03/22/2021   Osteomyelitis of ankle or foot, acute, left (HCC) 03/17/2021   Acute on chronic respiratory failure (HCC) 03/17/2021   Osteomyelitis (HCC) 12/16/2020   Depression 12/16/2020   Medicare annual wellness visit, initial 11/14/2019   B12 deficiency 11/14/2019   Hyperlipidemia 10/31/2019   Primary osteoarthritis of right knee 03/16/2019   Sleep apnea 10/31/2018   Traumatic incomplete tear of left rotator cuff 10/02/2018   Morbid obesity with BMI of 45.0-49.9, adult (HCC) 09/20/2018   Hypothyroidism 09/20/2018   Chronic diastolic CHF (congestive heart failure) (HCC) 09/20/2018   Benign essential hypertension 09/20/2018    ONSET DATE: 01/19/23  REFERRING DIAG: H82.993Z (ICD-10-CM) - Closed comminuted supracondylar fracture of left femur (HCC)   THERAPY DIAG:  Difficulty in walking, not elsewhere classified  Muscle weakness (generalized)  Unsteadiness on feet  Other abnormalities of gait and mobility  Abnormality of gait and mobility  Rationale for Evaluation and Treatment: Rehabilitation  SUBJECTIVE:  SUBJECTIVE STATEMENT: Pt reports no significant changes since last session. No falls or LOB.  Added Melatonin to assist with sleep. No significant improvement.   No pain at rest. Reports no pain in standing recently.   Has been non-compliant with HEP.  Pt accompanied by: family member  PERTINENT HISTORY: ORIF of left supracondylar distal femur on 01/20/23. Went to SNF on 01/25/23 for  rehab. Pt is here for femoral fracture. When pt was stepping out of the shower his leg gave out and he fell onto the floor. Pt is working on getting the shower changed to make it a simple walk in shower. Pt  reports most of his falls are in the bathroom. Pt has a bar in the shower but is unable to have a bar on the shower itself due to the set up. Pt is generally inactive and does not walk much around the house, relies on the wheelchair for mobility. Pt wants to get out of the wheel chair for mobility, at least a little bit. When in rehab over the summer he was walking about 20-25 ft at a time.    PAIN:  Are you having pain? Yes: Pain location: left knee Aggravating factors: certain movements   PRECAUTIONS: Fall  RED FLAGS: None   WEIGHT BEARING RESTRICTIONS: No  FALLS: Has patient fallen in last 6 months? Yes. Number of falls 1  LIVING ENVIRONMENT: Lives with: lives with their family Lives in: House/apartment Stairs: Yes: Internal: 1 steps; none and External: 2 steps; can reach both Has following equipment at home: Dan Humphreys - 2 wheeled, Environmental consultant - 4 wheeled, Wheelchair (manual), shower chair, Grab bars, and grabber to pick things up  PLOF: Requires assistive device for independence, Needs assistance with ADLs, Needs assistance with homemaking, Needs assistance with gait, and Needs assistance with transfers  PATIENT GOALS: Improve his mobility.   OBJECTIVE:   DIAGNOSTIC FINDINGS:from 01/20/23:  " IMPRESSION: ORIF of comminuted distal femur fracture with improved fracture alignment from preoperative imaging"  COGNITION: Overall cognitive status: Within functional limits for tasks assessed   SENSATION: Not tested  COORDINATION: Not tested   EDEMA:  Not measured but B LE swelling seemed to be likely based on pt presentation    LOWER EXTREMITY ROM:     Active  Right Eval Left Eval  Hip flexion    Hip extension    Hip abduction    Hip adduction    Hip internal rotation     Hip external rotation    Knee flexion  95*  Knee extension Unable to achieve full extension in seated  Unable to achieve full extension in seated   Ankle dorsiflexion    Ankle plantarflexion    Ankle inversion    Ankle eversion     (Blank rows = not tested) *indicated measured in seated in WC and could not accurately ID bony landmarks.   LOWER EXTREMITY MMT:    MMT Right Eval Left Eval  Hip flexion 4- 3+  Hip extension    Hip abduction  4-*  Hip adduction  4-  Hip internal rotation 4 3+  Hip external rotation 4 3  Knee flexion 4 4-  Knee extension 4 4-  Ankle dorsiflexion    Ankle plantarflexion    Ankle inversion    Ankle eversion    (Blank rows = not tested)    TRANSFERS: Assistive device utilized: Environmental consultant - 2 wheeled  Sit to stand: Min A and DTR holds walker, pt uses heavy UE assist  Stand to sit: CGA Chair to chair: CGA and uses walker, close guarding Floor:  unable  RAMP:   Ramp Comments: uses manual WC propulsion  CURB:   Curb Comments: uses manual WC propulsion  STAIRS:   Comments: not tested at eval, recomment testing at another time.   GAIT: Gait pattern:  left LE in hip ER throughout to make up for lack of ankle motion and strength. Heavy UE use on bari 2WW  Distance walked: 20 ft  Assistive device utilized: Environmental consultant - 2 wheeled and bariatric walker  Level of assistance: CGA and WC follow  Comments: decreased step length  FUNCTIONAL TESTS:  5 times sit to stand: 29 sec with heavy UE assist and walker Timed up and go (TUG): 88 sec walker 10 meter walk test: unable  PATIENT SURVEYS:  FOTO 28 RAG:49  TODAY'S TREATMENT 09/08/23:                                                                                                                           Heel raises on incline seated x15  Seated march x 12   Seated hip extension with manual resistance. x12 LAQ x 12 3 sec hold.   Sit<>stand from [facility] with CGA on this day with 1 UE support on RW    Standing march x 5 bil.   Sit<>stand 2x 5 - timed on second attempt 13 sec.   PT instructed pt in TUG: 38 sec (>13.5 sec indicates increased fall risk)    10 Meter Walk Test: Patient instructed to walk 10 meters (32.8 ft) as quickly and as safely as possible at their normal speed x2 and at a fast speed x2. Time measured from 2 meter mark to 8 meter mark to accommodate ramp-up and ramp-down.  Normal speed 1: 20.06sec.  m/s Normal speed 2: 18.15sec m/s Average Normal speed: 19.015sec 0.52 m/s Cut off scores: <0.4 m/s = household Ambulator, 0.4-0.8 m/s = limited community Ambulator, >0.8 m/s = community Ambulator, >1.2 m/s = crossing a street, <1.0 = increased fall risk MCID 0.05 m/s (small), 0.13 m/s (moderate), 0.06 m/s (significant)  (ANPTA Core Set of Outcome Measures for Adults with Neurologic Conditions, 2018)  Gait training with RW x 41ft. Additional gait with rollator x 39 ft. Mild antalgia and foot drag with 4WW. States that he feels safer with RW compared to 4WW on this day.   PATIENT EDUCATION: Education details: POC. Importance of HEP  Person educated: Patient Education method: Explanation Education comprehension: verbalized understanding   HOME EXERCISE PROGRAM: Access Code: [email or web address] URL: [email or web address] Date: 05/30/2023 Prepared by: Thresa Ross  Exercises - Seated Long Arc Quad  - 1 x daily - 7 x weekly - 2 sets - 10 reps - 3 sec  hold - Seated March  - 1 x daily - 7 x weekly - 2 sets - 10 reps - Seated single leg step to side   - 1 x daily - 7 x weekly -  2 sets - 10 reps - Seated Heel Raise  - 1 x daily - 7 x weekly - 2 sets - 15 reps   GOALS: Goals reviewed with patient? Yes  SHORT TERM GOALS: Target date: 10/07/2023      Patient will be independent in home exercise program to improve strength/mobility for better functional independence with ADLs. Baseline: 12/17: completing once per week 1/16: states that he has not completed HEP  in the last 2 weeks.  Goal status: ONGOING   LONG TERM GOALS: Target date: 11/04/2023     1.  Patient (> 27 years old) will complete five times sit to stand test in <20 seconds and with UE support only on chair ( can use walker in standing for balance but cannot pull on walker) indicating an increased LE strength and improved balance. Baseline: 29 sec with heavy UE assist and walker 12/17: 26.45, UE assist 9 09/08/23: 13 sec with heavy UE support  Goal status: ongoing  2.  Patient will increase FOTO score to equal to or greater than  49   to demonstrate statistically significant improvement in mobility and quality of life.  Baseline: 28 12/17: 48 Goal status: ONGOING    3.  Patient will reduce timed up and go to <11 seconds to reduce fall risk and demonstrate improved transfer/gait ability. Baseline: 88 sec with walker and with Heavy UE use to come to standing position  12/17: 40.35 sec  1/16: 38 sec   Goal status: ONGOING  4.   Patient will increase 10 meter walk test to > .5 m/s as to improve gait speed for better community ambulation and to reduce fall risk. Baseline: Unable to ambulate this distance on eval  12/17: 20.7 sec  09/08/23: 19.015 sec  Goal status: ONGOING    ASSESSMENT:  CLINICAL IMPRESSION:    Pt tolerated all tasks during today's visit. Partial goal assessment for re-certification. Pt demonstrates mild improvement in balance with improved 5xSTS, tolerance to standing and increased gait distance with RW. Pt reports that he has been mostly sedentary and inconsistent with with HEP. Education on importance HEP and need to continue to show progress to continue PT. Pt will continue to benefit from skilled physical therapy intervention to address impairments, improve QOL, and attain therapy goals. Patient's condition has the potential to improve in response to therapy. Maximum improvement is yet to be obtained. The anticipated improvement is attainable and reasonable in  a generally predictable time.     OBJECTIVE IMPAIRMENTS: Abnormal gait, decreased activity tolerance, decreased balance, decreased endurance, decreased mobility, difficulty walking, decreased ROM, decreased strength, obesity, and pain.   ACTIVITY LIMITATIONS: carrying, lifting, bending, standing, squatting, stairs, transfers, bed mobility, bathing, toileting, dressing, and locomotion level  PARTICIPATION LIMITATIONS: driving, shopping, community activity, and yard work  PERSONAL FACTORS: Age, Behavior pattern, Fitness, Past/current experiences, Time since onset of injury/illness/exacerbation, and 3+ comorbidities: CHF, COPD, HLD, HTN  are also affecting patient's functional outcome.   REHAB POTENTIAL: Fair previous poor compliance with HEP and with therapy visits.   CLINICAL DECISION MAKING: Stable/uncomplicated  EVALUATION COMPLEXITY: Low  PLAN:  PT FREQUENCY: 2x/week  PT DURATION: 8 weeks  PLANNED INTERVENTIONS: 97110-Therapeutic exercises, 97530- Therapeutic activity, [id number]- Neuromuscular re-education, 97535- Self Care, [id number]- Manual therapy, [phone]- Gait training, [phone]- Prosthetic training, Patient/Family education, Balance training, Stair training, Joint mobilization, DME instructions, Wheelchair mobility training, Cryotherapy, and Moist heat  PLAN FOR NEXT SESSION:   Tolerance to standing. Dynamic balance and standing strengthening.    Eliberto Ivory  Lucius Conn, PT, DPT 09/08/2023, 11:43 AM

## 2023-09-13 ENCOUNTER — Ambulatory Visit: Payer: Medicare Other | Admitting: Physical Therapy

## 2023-09-15 ENCOUNTER — Ambulatory Visit: Payer: Medicare Other | Admitting: Physical Therapy

## 2023-09-15 DIAGNOSIS — R262 Difficulty in walking, not elsewhere classified: Secondary | ICD-10-CM

## 2023-09-15 DIAGNOSIS — R2681 Unsteadiness on feet: Secondary | ICD-10-CM

## 2023-09-15 DIAGNOSIS — M6281 Muscle weakness (generalized): Secondary | ICD-10-CM

## 2023-09-15 DIAGNOSIS — R269 Unspecified abnormalities of gait and mobility: Secondary | ICD-10-CM

## 2023-09-15 DIAGNOSIS — R2689 Other abnormalities of gait and mobility: Secondary | ICD-10-CM

## 2023-09-15 NOTE — Therapy (Signed)
OUTPATIENT PHYSICAL THERAPY NEURO TREATMENT/   Patient Name: Christopher [name] MRN: [id number] DOB:[birth year], 73 y.o., male Today's Date: 09/15/2023   PCP: Danella Penton, MD  REFERRING PROVIDER: Roby Lofts, MD    END OF SESSION:  PT End of Session - 09/15/23 1152     Visit Number 13    Number of Visits 28    Date for PT Re-Evaluation 11/04/23    Authorization Type [employer] Medicare    Progress Note Due on Visit 10    PT Start Time 1149    PT Stop Time 1230    PT Time Calculation (min) 41 min    Equipment Utilized During Treatment Gait belt    Activity Tolerance Patient tolerated treatment well    Behavior During Therapy WFL for tasks assessed/performed                    Past Medical History:  Diagnosis Date   Anxiety    CHF (congestive heart failure) (HCC)    COPD (chronic obstructive pulmonary disease) (HCC)    Depression    Dysrhythmia    Hyperlipidemia    Hypertension    Hypothyroidism    Sleep apnea    Past Surgical History:  Procedure Laterality Date   FRACTURE SURGERY     ORIF ANKLE FRACTURE Left 12/17/2020   Procedure: OPEN REDUCTION INTERNAL FIXATION (ORIF) ANKLE FRACTURE;  Surgeon: Gwyneth Revels, DPM;  Location: [facility] ORS;  Service: Podiatry;  Laterality: Left;   ORIF FEMUR FRACTURE Left 01/20/2023   Procedure: OPEN REDUCTION INTERNAL FIXATION (ORIF) DISTAL FEMUR FRACTURE;  Surgeon: Roby Lofts, MD;  Location: [facility];  Service: Orthopedics;  Laterality: Left;   Patient Active Problem List   Diagnosis Date Noted   Sepsis due to cellulitis (HCC) 06/11/2023   Paroxysmal atrial fibrillation (HCC) 06/11/2023   Closed left subtrochanteric femur fracture, initial encounter (HCC) 01/19/2023   CKD (chronic kidney disease) stage 4, GFR 15-29 ml/min (HCC) 01/26/2022   Generalized weakness 10/16/2021   Chronic respiratory failure with hypoxia (HCC) 10/16/2021   Hypotension    SIRS (systemic inflammatory response syndrome) (HCC)    AF (paroxysmal  atrial fibrillation) (HCC)    Syncope    Severe sepsis (HCC) 05/20/2021   Pressure injury of skin 04/25/2021   Acute respiratory failure (HCC) 04/21/2021   Acute kidney injury superimposed on CKD (HCC) 04/21/2021   Stage 3b chronic kidney disease (CKD) (HCC) 03/22/2021   Leukocytosis 03/22/2021   Osteomyelitis of ankle or foot, acute, left (HCC) 03/17/2021   Acute on chronic respiratory failure (HCC) 03/17/2021   Osteomyelitis (HCC) 12/16/2020   Depression 12/16/2020   Medicare annual wellness visit, initial 11/14/2019   B12 deficiency 11/14/2019   Hyperlipidemia 10/31/2019   Primary osteoarthritis of right knee 03/16/2019   Sleep apnea 10/31/2018   Traumatic incomplete tear of left rotator cuff 10/02/2018   Morbid obesity with BMI of 45.0-49.9, adult (HCC) 09/20/2018   Hypothyroidism 09/20/2018   Chronic diastolic CHF (congestive heart failure) (HCC) 09/20/2018   Benign essential hypertension 09/20/2018    ONSET DATE: 01/19/23  REFERRING DIAG: N56.213Y (ICD-10-CM) - Closed comminuted supracondylar fracture of left femur (HCC)   THERAPY DIAG:  Difficulty in walking, not elsewhere classified  Muscle weakness (generalized)  Unsteadiness on feet  Other abnormalities of gait and mobility  Abnormality of gait and mobility  Rationale for Evaluation and Treatment: Rehabilitation  SUBJECTIVE:  SUBJECTIVE STATEMENT:  Pt reports that he is "alright" today. Daughter said that he had blood drawn this morning. Also states that he has "sore" on L upper thigh just distal to buttock. Has bandaged wound for roughly a week.    Pt accompanied by: family member  PERTINENT HISTORY: ORIF of left supracondylar distal femur on 01/20/23. Went to SNF on 01/25/23 for rehab. Pt is here for femoral fracture. When pt  was stepping out of the shower his leg gave out and he fell onto the floor. Pt is working on getting the shower changed to make it a simple walk in shower. Pt  reports most of his falls are in the bathroom. Pt has a bar in the shower but is unable to have a bar on the shower itself due to the set up. Pt is generally inactive and does not walk much around the house, relies on the wheelchair for mobility. Pt wants to get out of the wheel chair for mobility, at least a little bit. When in rehab over the summer he was walking about 20-25 ft at a time.    PAIN:  Are you having pain? Yes: Pain location: left knee Aggravating factors: certain movements   PRECAUTIONS: Fall  RED FLAGS: None   WEIGHT BEARING RESTRICTIONS: No  FALLS: Has patient fallen in last 6 months? Yes. Number of falls 1  LIVING ENVIRONMENT: Lives with: lives with their family Lives in: House/apartment Stairs: Yes: Internal: 1 steps; none and External: 2 steps; can reach both Has following equipment at home: Dan Humphreys - 2 wheeled, Environmental consultant - 4 wheeled, Wheelchair (manual), shower chair, Grab bars, and grabber to pick things up  PLOF: Requires assistive device for independence, Needs assistance with ADLs, Needs assistance with homemaking, Needs assistance with gait, and Needs assistance with transfers  PATIENT GOALS: Improve his mobility.   OBJECTIVE:   DIAGNOSTIC FINDINGS:from 01/20/23:  " IMPRESSION: ORIF of comminuted distal femur fracture with improved fracture alignment from preoperative imaging"  COGNITION: Overall cognitive status: Within functional limits for tasks assessed   SENSATION: Not tested  COORDINATION: Not tested   EDEMA:  Not measured but B LE swelling seemed to be likely based on pt presentation    LOWER EXTREMITY ROM:     Active  Right Eval Left Eval  Hip flexion    Hip extension    Hip abduction    Hip adduction    Hip internal rotation    Hip external rotation    Knee flexion  95*   Knee extension Unable to achieve full extension in seated  Unable to achieve full extension in seated   Ankle dorsiflexion    Ankle plantarflexion    Ankle inversion    Ankle eversion     (Blank rows = not tested) *indicated measured in seated in WC and could not accurately ID bony landmarks.   LOWER EXTREMITY MMT:    MMT Right Eval Left Eval  Hip flexion 4- 3+  Hip extension    Hip abduction  4-*  Hip adduction  4-  Hip internal rotation 4 3+  Hip external rotation 4 3  Knee flexion 4 4-  Knee extension 4 4-  Ankle dorsiflexion    Ankle plantarflexion    Ankle inversion    Ankle eversion    (Blank rows = not tested)    TRANSFERS: Assistive device utilized: Environmental consultant - 2 wheeled  Sit to stand: Min A and DTR holds walker, pt uses heavy UE assist  Stand  to sit: CGA Chair to chair: CGA and uses walker, close guarding Floor:  unable  RAMP:   Ramp Comments: uses manual WC propulsion  CURB:   Curb Comments: uses manual WC propulsion  STAIRS:   Comments: not tested at eval, recomment testing at another time.   GAIT: Gait pattern:  left LE in hip ER throughout to make up for lack of ankle motion and strength. Heavy UE use on bari 2WW  Distance walked: 20 ft  Assistive device utilized: Environmental consultant - 2 wheeled and bariatric walker  Level of assistance: CGA and WC follow  Comments: decreased step length  FUNCTIONAL TESTS:  5 times sit to stand: 29 sec with heavy UE assist and walker Timed up and go (TUG): 88 sec walker 10 meter walk test: unable  PATIENT SURVEYS:  FOTO 28 RAG:49  TODAY'S TREATMENT 09/15/23:                                                                                                                           Sit<>stand 2 x 6 wiith UE supported on RW from mat table. Donned 3# AW  Heel raises on incline seated x15  Seated march 2x 12   LAQ 2x 12 3 sec hold.   Standing march partial range UE support on RW. X 10 bil  Standing hip abduction for  lateral toe tap on ground x 8 bil with UE supported on RW  Gait training with RW forward/reverse 9ft 2  x bouts each.  Gait with RW x 49ft with CGA/supervision assist. Noted to have flexed posture and reduced step height on the BLE.   PT adjusted WC brake on the L side to reduce fall risk  PATIENT EDUCATION: Education details: POC. Importance of HEP  Person educated: Patient Education method: Explanation Education comprehension: verbalized understanding   HOME EXERCISE PROGRAM: Access Code: [email or web address] URL: [email or web address] Date: 05/30/2023 Prepared by: Thresa Ross  Exercises - Seated Long Arc Quad  - 1 x daily - 7 x weekly - 2 sets - 10 reps - 3 sec  hold - Seated March  - 1 x daily - 7 x weekly - 2 sets - 10 reps - Seated single leg step to side   - 1 x daily - 7 x weekly - 2 sets - 10 reps - Seated Heel Raise  - 1 x daily - 7 x weekly - 2 sets - 15 reps   GOALS: Goals reviewed with patient? Yes  SHORT TERM GOALS: Target date: 10/07/2023      Patient will be independent in home exercise program to improve strength/mobility for better functional independence with ADLs. Baseline: 12/17: completing once per week 1/16: states that he has not completed HEP in the last 2 weeks.  Goal status: ONGOING   LONG TERM GOALS: Target date: 11/04/2023     1.  Patient (> 49 years old) will complete five times sit to stand test in <20 seconds and with UE support  only on chair ( can use walker in standing for balance but cannot pull on walker) indicating an increased LE strength and improved balance. Baseline: 29 sec with heavy UE assist and walker 12/17: 26.45, UE assist 9 09/08/23: 13 sec with heavy UE support  Goal status: ongoing  2.  Patient will increase FOTO score to equal to or greater than  49   to demonstrate statistically significant improvement in mobility and quality of life.  Baseline: 28 12/17: 48 Goal status: ONGOING    3.  Patient will  reduce timed up and go to <11 seconds to reduce fall risk and demonstrate improved transfer/gait ability. Baseline: 88 sec with walker and with Heavy UE use to come to standing position  12/17: 40.35 sec  1/16: 38 sec   Goal status: ONGOING  4.   Patient will increase 10 meter walk test to > .5 m/s as to improve gait speed for better community ambulation and to reduce fall risk. Baseline: Unable to ambulate this distance on eval  12/17: 20.7 sec  09/08/23: 19.015 sec  Goal status: ONGOING    ASSESSMENT:  CLINICAL IMPRESSION:    Pt tolerated all tasks during today's visit. Pt demonstrated improved independence and increased speed with sit<>stand. No LOB with with forward/reverse gait Or standing strengthening. Tolerated increased time in standing on this day with no pain. Rates SOB 7/10 upon completion of gait training, SpO2 100%.  Pt will continue to benefit from skilled physical therapy intervention to address impairments, improve QOL, and attain therapy goals.   OBJECTIVE IMPAIRMENTS: Abnormal gait, decreased activity tolerance, decreased balance, decreased endurance, decreased mobility, difficulty walking, decreased ROM, decreased strength, obesity, and pain.   ACTIVITY LIMITATIONS: carrying, lifting, bending, standing, squatting, stairs, transfers, bed mobility, bathing, toileting, dressing, and locomotion level  PARTICIPATION LIMITATIONS: driving, shopping, community activity, and yard work  PERSONAL FACTORS: Age, Behavior pattern, Fitness, Past/current experiences, Time since onset of injury/illness/exacerbation, and 3+ comorbidities: CHF, COPD, HLD, HTN  are also affecting patient's functional outcome.   REHAB POTENTIAL: Fair previous poor compliance with HEP and with therapy visits.   CLINICAL DECISION MAKING: Stable/uncomplicated  EVALUATION COMPLEXITY: Low  PLAN:  PT FREQUENCY: 2x/week  PT DURATION: 8 weeks  PLANNED INTERVENTIONS: 97110-Therapeutic exercises, 97530-  Therapeutic activity, [id number]- Neuromuscular re-education, 97535- Self Care, [id number]- Manual therapy, [phone]- Gait training, [phone]- Prosthetic training, Patient/Family education, Balance training, Stair training, Joint mobilization, DME instructions, Wheelchair mobility training, Cryotherapy, and Moist heat  PLAN FOR NEXT SESSION:   Continue POC. Increase Tolerance to standing. Dynamic balance and standing strengthening.    Golden Pop, PT, DPT 09/15/2023, 11:53 AM

## 2023-09-20 ENCOUNTER — Ambulatory Visit: Payer: Medicare Other | Admitting: Physical Therapy

## 2023-09-22 ENCOUNTER — Ambulatory Visit: Payer: Medicare Other | Admitting: Physical Therapy

## 2023-09-22 DIAGNOSIS — R262 Difficulty in walking, not elsewhere classified: Secondary | ICD-10-CM

## 2023-09-22 DIAGNOSIS — R269 Unspecified abnormalities of gait and mobility: Secondary | ICD-10-CM

## 2023-09-22 DIAGNOSIS — M6281 Muscle weakness (generalized): Secondary | ICD-10-CM

## 2023-09-22 DIAGNOSIS — R2689 Other abnormalities of gait and mobility: Secondary | ICD-10-CM

## 2023-09-22 DIAGNOSIS — R2681 Unsteadiness on feet: Secondary | ICD-10-CM

## 2023-09-22 NOTE — Therapy (Signed)
OUTPATIENT PHYSICAL THERAPY NEURO TREATMENT   Patient Name: Christopher [name] MRN: [id number] DOB:1950-10-24, 73 y.o., male Today's Date: 09/22/2023   PCP: Danella Penton, MD  REFERRING PROVIDER: Roby Lofts, MD    END OF SESSION:  PT End of Session - 09/22/23 1208     Visit Number 14    Number of Visits 28    Date for PT Re-Evaluation 11/04/23    Authorization Type [employer] Medicare    Progress Note Due on Visit 20    PT Start Time 1101    PT Stop Time 1143    PT Time Calculation (min) 42 min    Equipment Utilized During Treatment Gait belt    Activity Tolerance Patient tolerated treatment well    Behavior During Therapy WFL for tasks assessed/performed                     Past Medical History:  Diagnosis Date   Anxiety    CHF (congestive heart failure) (HCC)    COPD (chronic obstructive pulmonary disease) (HCC)    Depression    Dysrhythmia    Hyperlipidemia    Hypertension    Hypothyroidism    Sleep apnea    Past Surgical History:  Procedure Laterality Date   FRACTURE SURGERY     ORIF ANKLE FRACTURE Left 12/17/2020   Procedure: OPEN REDUCTION INTERNAL FIXATION (ORIF) ANKLE FRACTURE;  Surgeon: Gwyneth Revels, DPM;  Location: [facility] OR[facility];  Service: Podiatry;  Laterality: Left;   ORIF FEMUR FRACTURE Left 01/20/2023   Procedure: OPEN REDUCTION INTERNAL FIXATION (ORIF) DISTAL FEMUR FRACTURE;  Surgeon: Roby Lofts, MD;  Location: [facility] OR;  Service: Orthopedics;  Laterality: Left;   Patient Active Problem List   Diagnosis Date Noted   Sepsis due to cellulitis (HCC) 06/11/2023   Paroxysmal atrial fibrillation (HCC) 06/11/2023   Closed left subtrochanteric femur fracture, initial encounter (HCC) 01/19/2023   CKD (chronic kidney disease) stage 4, GFR 15-29 ml/min (HCC) 01/26/2022   Generalized weakness 10/16/2021   Chronic respiratory failure with hypoxia (HCC) 10/16/2021   Hypotension    SIRS (systemic inflammatory response syndrome) (HCC)    AF (paroxysmal  atrial fibrillation) (HCC)    Syncope    Severe sepsis (HCC) 05/20/2021   Pressure injury of skin 04/25/2021   Acute respiratory failure (HCC) 04/21/2021   Acute kidney injury superimposed on CKD (HCC) 04/21/2021   Stage 3b chronic kidney disease (CKD) (HCC) 03/22/2021   Leukocytosis 03/22/2021   Osteomyelitis of ankle or foot, acute, left (HCC) 03/17/2021   Acute on chronic respiratory failure (HCC) 03/17/2021   Osteomyelitis (HCC) 12/16/2020   Depression 12/16/2020   Medicare annual wellness visit, initial 11/14/2019   B12 deficiency 11/14/2019   Hyperlipidemia 10/31/2019   Primary osteoarthritis of right knee 03/16/2019   Sleep apnea 10/31/2018   Traumatic incomplete tear of left rotator cuff 10/02/2018   Morbid obesity with BMI of 45.0-49.9, adult (HCC) 09/20/2018   Hypothyroidism 09/20/2018   Chronic diastolic CHF (congestive heart failure) (HCC) 09/20/2018   Benign essential hypertension 09/20/2018    ONSET DATE: 01/19/23  REFERRING DIAG: X91.478G (ICD-10-CM) - Closed comminuted supracondylar fracture of left femur (HCC)   THERAPY DIAG:  Difficulty in walking, not elsewhere classified  Muscle weakness (generalized)  Unsteadiness on feet  Other abnormalities of gait and mobility  Abnormality of gait and mobility  Rationale for Evaluation and Treatment: Rehabilitation  SUBJECTIVE:  SUBJECTIVE STATEMENT:  Pt had physical yesterday. Pt reports he will by starting a hydrocodone as needed for pain and his MD instructed them to tell his other medical staff. Pt also started magnesium glycenate for heart, nerve and bone health.   Pt accompanied by: family member  PERTINENT HISTORY: ORIF of left supracondylar distal femur on 01/20/23. Went to SNF on 01/25/23 for rehab. Pt is here for femoral  fracture. When pt was stepping out of the shower his leg gave out and he fell onto the floor. Pt is working on getting the shower changed to make it a simple walk in shower. Pt  reports most of his falls are in the bathroom. Pt has a bar in the shower but is unable to have a bar on the shower itself due to the set up. Pt is generally inactive and does not walk much around the house, relies on the wheelchair for mobility. Pt wants to get out of the wheel chair for mobility, at least a little bit. When in rehab over the summer he was walking about 20-25 ft at a time.    PAIN:  Are you having pain? Yes: Pain location: left knee Aggravating factors: certain movements   PRECAUTIONS: Fall  RED FLAGS: None   WEIGHT BEARING RESTRICTIONS: No  FALLS: Has patient fallen in last 6 months? Yes. Number of falls 1  LIVING ENVIRONMENT: Lives with: lives with their family Lives in: House/apartment Stairs: Yes: Internal: 1 steps; none and External: 2 steps; can reach both Has following equipment at home: Dan Humphreys - 2 wheeled, Environmental consultant - 4 wheeled, Wheelchair (manual), shower chair, Grab bars, and grabber to pick things up  PLOF: Requires assistive device for independence, Needs assistance with ADLs, Needs assistance with homemaking, Needs assistance with gait, and Needs assistance with transfers  PATIENT GOALS: Improve his mobility.   OBJECTIVE:   DIAGNOSTIC FINDINGS:from 01/20/23:  " IMPRESSION: ORIF of comminuted distal femur fracture with improved fracture alignment from preoperative imaging"  COGNITION: Overall cognitive status: Within functional limits for tasks assessed   SENSATION: Not tested  COORDINATION: Not tested   EDEMA:  Not measured but B LE swelling seemed to be likely based on pt presentation    LOWER EXTREMITY ROM:     Active  Right Eval Left Eval  Hip flexion    Hip extension    Hip abduction    Hip adduction    Hip internal rotation    Hip external rotation     Knee flexion  95*  Knee extension Unable to achieve full extension in seated  Unable to achieve full extension in seated   Ankle dorsiflexion    Ankle plantarflexion    Ankle inversion    Ankle eversion     (Blank rows = not tested) *indicated measured in seated in WC and could not accurately ID bony landmarks.   LOWER EXTREMITY MMT:    MMT Right Eval Left Eval  Hip flexion 4- 3+  Hip extension    Hip abduction  4-*  Hip adduction  4-  Hip internal rotation 4 3+  Hip external rotation 4 3  Knee flexion 4 4-  Knee extension 4 4-  Ankle dorsiflexion    Ankle plantarflexion    Ankle inversion    Ankle eversion    (Blank rows = not tested)    TRANSFERS: Assistive device utilized: Environmental consultant - 2 wheeled  Sit to stand: Min A and DTR holds walker, pt uses heavy UE assist  Stand  to sit: CGA Chair to chair: CGA and uses walker, close guarding Floor:  unable  RAMP:   Ramp Comments: uses manual WC propulsion  CURB:   Curb Comments: uses manual WC propulsion  STAIRS:   Comments: not tested at eval, recomment testing at another time.   GAIT: Gait pattern:  left LE in hip ER throughout to make up for lack of ankle motion and strength. Heavy UE use on bari 2WW  Distance walked: 20 ft  Assistive device utilized: Environmental consultant - 2 wheeled and bariatric walker  Level of assistance: CGA and WC follow  Comments: decreased step length  FUNCTIONAL TESTS:  5 times sit to stand: 29 sec with heavy UE assist and walker Timed up and go (TUG): 88 sec walker 10 meter walk test: unable  PATIENT SURVEYS:  FOTO 28 RAG:49  TODAY'S TREATMENT 09/22/23:                                                                                                                          Transfer to mat table to complete below TE TE- To improve strength, endurance, mobility, and function of specific targeted muscle groups or improve joint range of motion or improve muscle flexibility Alternating with  TE- To  improve strength, endurance, mobility, and function of specific targeted muscle groups or improve joint range of motion or improve muscle flexibility   Donned 4# AW  TE: LAQ x 15 ea LE  TA: STS x 5 reps from mat table with RW support  TE: LAQ x 15 ea LE  TA: STS x 5 reps  TE: Heel raises on incline seated 2 x15  TA: STS x 5 reps with UE support  TE Standing march with RW x 10 ea LE  Seated ball squeeze 20 x 3 sec holds   TA: Gait with RW x 75ft with CGA/supervision assist Gait training forward and reverse 2 x 8 ft ea   Walk x 6 ft then transfer to [facility] to end session   Noted to have flexed posture and reduced step height on the BLE.   PATIENT EDUCATION: Education details: POC. Importance of HEP  Person educated: Patient Education method: Explanation Education comprehension: verbalized understanding   HOME EXERCISE PROGRAM: Access Code: [email or web address] URL: [email or web address] Date: 05/30/2023 Prepared by: Thresa Ross  Exercises - Seated Long Arc Quad  - 1 x daily - 7 x weekly - 2 sets - 10 reps - 3 sec  hold - Seated March  - 1 x daily - 7 x weekly - 2 sets - 10 reps - Seated single leg step to side   - 1 x daily - 7 x weekly - 2 sets - 10 reps - Seated Heel Raise  - 1 x daily - 7 x weekly - 2 sets - 15 reps   GOALS: Goals reviewed with patient? Yes  SHORT TERM GOALS: Target date: 10/07/2023      Patient will be independent in home exercise program  to improve strength/mobility for better functional independence with ADLs. Baseline: 12/17: completing once per week 1/16: states that he has not completed HEP in the last 2 weeks.  Goal status: ONGOING   LONG TERM GOALS: Target date: 11/04/2023     1.  Patient (> 81 years old) will complete five times sit to stand test in <20 seconds and with UE support only on chair ( can use walker in standing for balance but cannot pull on walker) indicating an increased LE strength and improved balance. Baseline:  29 sec with heavy UE assist and walker 12/17: 26.45, UE assist 9 09/08/23: 13 sec with heavy UE support  Goal status: ongoing  2.  Patient will increase FOTO score to equal to or greater than  49   to demonstrate statistically significant improvement in mobility and quality of life.  Baseline: 28 12/17: 48 Goal status: ONGOING    3.  Patient will reduce timed up and go to <11 seconds to reduce fall risk and demonstrate improved transfer/gait ability. Baseline: 88 sec with walker and with Heavy UE use to come to standing position  12/17: 40.35 sec  1/16: 38 sec   Goal status: ONGOING  4.   Patient will increase 10 meter walk test to > .5 m/s as to improve gait speed for better community ambulation and to reduce fall risk. Baseline: Unable to ambulate this distance on eval  12/17: 20.7 sec  09/08/23: 19.015 sec  Goal status: ONGOING    ASSESSMENT:  CLINICAL IMPRESSION:    Pt tolerated all tasks during today's visit. Pt will continue to benefit from skilled physical therapy intervention to address impairments, improve QOL, and attain therapy goals.   OBJECTIVE IMPAIRMENTS: Abnormal gait, decreased activity tolerance, decreased balance, decreased endurance, decreased mobility, difficulty walking, decreased ROM, decreased strength, obesity, and pain.   ACTIVITY LIMITATIONS: carrying, lifting, bending, standing, squatting, stairs, transfers, bed mobility, bathing, toileting, dressing, and locomotion level  PARTICIPATION LIMITATIONS: driving, shopping, community activity, and yard work  PERSONAL FACTORS: Age, Behavior pattern, Fitness, Past/current experiences, Time since onset of injury/illness/exacerbation, and 3+ comorbidities: CHF, COPD, HLD, HTN  are also affecting patient's functional outcome.   REHAB POTENTIAL: Fair previous poor compliance with HEP and with therapy visits.   CLINICAL DECISION MAKING: Stable/uncomplicated  EVALUATION COMPLEXITY: Low  PLAN:  PT FREQUENCY:  2x/week  PT DURATION: 8 weeks  PLANNED INTERVENTIONS: 97110-Therapeutic exercises, 97530- Therapeutic activity, [id number]- Neuromuscular re-education, 97535- Self Care, 56213- Manual therapy, [phone]- Gait training, [phone]- Prosthetic training, Patient/Family education, Balance training, Stair training, Joint mobilization, DME instructions, Wheelchair mobility training, Cryotherapy, and Moist heat  PLAN FOR NEXT SESSION:   Continue POC. Increase Tolerance to standing. Dynamic balance and standing strengthening.    Norman Herrlich, PT, DPT 09/22/2023, 12:08 PM

## 2023-09-27 ENCOUNTER — Ambulatory Visit: Payer: Medicare Other | Admitting: Physical Therapy

## 2023-09-29 ENCOUNTER — Ambulatory Visit: Payer: Medicare Other | Admitting: Physical Therapy

## 2023-09-29 NOTE — Therapy (Deleted)
 OUTPATIENT PHYSICAL THERAPY NEURO TREATMENT   Patient Name: Christopher [name] MRN: [id number] DOB:Jan 26, 1951, 73 y.o., male Today's Date: 09/29/2023   PCP: Cleotilde Oneil FALCON, MD  REFERRING PROVIDER: Kendal Franky SQUIBB, MD    END OF SESSION:            Past Medical History:  Diagnosis Date   Anxiety    CHF (congestive heart failure) (HCC)    COPD (chronic obstructive pulmonary disease) (HCC)    Depression    Dysrhythmia    Hyperlipidemia    Hypertension    Hypothyroidism    Sleep apnea    Past Surgical History:  Procedure Laterality Date   FRACTURE SURGERY     ORIF ANKLE FRACTURE Left 12/17/2020   Procedure: OPEN REDUCTION INTERNAL FIXATION (ORIF) ANKLE FRACTURE;  Surgeon: Ashley Soulier, DPM;  Location: [facility] ORS;  Service: Podiatry;  Laterality: Left;   ORIF FEMUR FRACTURE Left 01/20/2023   Procedure: OPEN REDUCTION INTERNAL FIXATION (ORIF) DISTAL FEMUR FRACTURE;  Surgeon: Kendal Franky SQUIBB, MD;  Location: [facility];  Service: Orthopedics;  Laterality: Left;   Patient Active Problem List   Diagnosis Date Noted   Sepsis due to cellulitis (HCC) 06/11/2023   Paroxysmal atrial fibrillation (HCC) 06/11/2023   Closed left subtrochanteric femur fracture, initial encounter (HCC) 01/19/2023   CKD (chronic kidney disease) stage 4, GFR 15-29 ml/min (HCC) 01/26/2022   Generalized weakness 10/16/2021   Chronic respiratory failure with hypoxia (HCC) 10/16/2021   Hypotension    SIRS (systemic inflammatory response syndrome) (HCC)    AF (paroxysmal atrial fibrillation) (HCC)    Syncope    Severe sepsis (HCC) 05/20/2021   Pressure injury of skin 04/25/2021   Acute respiratory failure (HCC) 04/21/2021   Acute kidney injury superimposed on CKD (HCC) 04/21/2021   Stage 3b chronic kidney disease (CKD) (HCC) 03/22/2021   Leukocytosis 03/22/2021   Osteomyelitis of ankle or foot, acute, left (HCC) 03/17/2021   Acute on chronic respiratory failure (HCC) 03/17/2021   Osteomyelitis (HCC)  12/16/2020   Depression 12/16/2020   Medicare annual wellness visit, initial 11/14/2019   B12 deficiency 11/14/2019   Hyperlipidemia 10/31/2019   Primary osteoarthritis of right knee 03/16/2019   Sleep apnea 10/31/2018   Traumatic incomplete tear of left rotator cuff 10/02/2018   Morbid obesity with BMI of 45.0-49.9, adult (HCC) 09/20/2018   Hypothyroidism 09/20/2018   Chronic diastolic CHF (congestive heart failure) (HCC) 09/20/2018   Benign essential hypertension 09/20/2018    ONSET DATE: 01/19/23  REFERRING DIAG: D27.547J (ICD-10-CM) - Closed comminuted supracondylar fracture of left femur (HCC)   THERAPY DIAG:  No diagnosis found.  Rationale for Evaluation and Treatment: Rehabilitation  SUBJECTIVE:  SUBJECTIVE STATEMENT:  Pt had physical yesterday. Pt reports he will by starting a hydrocodone  as needed for pain and his MD instructed them to tell his other medical staff. Pt also started magnesium  glycenate for heart, nerve and bone health.   Pt accompanied by: family member  PERTINENT HISTORY: ORIF of left supracondylar distal femur on 01/20/23. Went to SNF on 01/25/23 for rehab. Pt is here for femoral fracture. When pt was stepping out of the shower his leg gave out and he fell onto the floor. Pt is working on getting the shower changed to make it a simple walk in shower. Pt  reports most of his falls are in the bathroom. Pt has a bar in the shower but is unable to have a bar on the shower itself due to the set up. Pt is generally inactive and does not walk much around the house, relies on the wheelchair for mobility. Pt wants to get out of the wheel chair for mobility, at least a little bit. When in rehab over the summer he was walking about 20-25 ft at a time.    PAIN:  Are you having pain? Yes:  Pain location: left knee Aggravating factors: certain movements   PRECAUTIONS: Fall  RED FLAGS: None   WEIGHT BEARING RESTRICTIONS: No  FALLS: Has patient fallen in last 6 months? Yes. Number of falls 1  LIVING ENVIRONMENT: Lives with: lives with their family Lives in: House/apartment Stairs: Yes: Internal: 1 steps; none and External: 2 steps; can reach both Has following equipment at home: Vannie - 2 wheeled, Environmental Consultant - 4 wheeled, Wheelchair (manual), shower chair, Grab bars, and grabber to pick things up  PLOF: Requires assistive device for independence, Needs assistance with ADLs, Needs assistance with homemaking, Needs assistance with gait, and Needs assistance with transfers  PATIENT GOALS: Improve his mobility.   OBJECTIVE:   DIAGNOSTIC FINDINGS:from 01/20/23:   IMPRESSION: ORIF of comminuted distal femur fracture with improved fracture alignment from preoperative imaging  COGNITION: Overall cognitive status: Within functional limits for tasks assessed   SENSATION: Not tested  COORDINATION: Not tested   EDEMA:  Not measured but B LE swelling seemed to be likely based on pt presentation    LOWER EXTREMITY ROM:     Active  Right Eval Left Eval  Hip flexion    Hip extension    Hip abduction    Hip adduction    Hip internal rotation    Hip external rotation    Knee flexion  95*  Knee extension Unable to achieve full extension in seated  Unable to achieve full extension in seated   Ankle dorsiflexion    Ankle plantarflexion    Ankle inversion    Ankle eversion     (Blank rows = not tested) *indicated measured in seated in WC and could not accurately ID bony landmarks.   LOWER EXTREMITY MMT:    MMT Right Eval Left Eval  Hip flexion 4- 3+  Hip extension    Hip abduction  4-*  Hip adduction  4-  Hip internal rotation 4 3+  Hip external rotation 4 3  Knee flexion 4 4-  Knee extension 4 4-  Ankle dorsiflexion    Ankle plantarflexion    Ankle  inversion    Ankle eversion    (Blank rows = not tested)    TRANSFERS: Assistive device utilized: Environmental Consultant - 2 wheeled  Sit to stand: Min A and DTR holds walker, pt uses heavy UE assist  Stand  to sit: CGA Chair to chair: CGA and uses walker, close guarding Floor:  unable  RAMP:   Ramp Comments: uses manual WC propulsion  CURB:   Curb Comments: uses manual WC propulsion  STAIRS:   Comments: not tested at eval, recomment testing at another time.   GAIT: Gait pattern:  left LE in hip ER throughout to make up for lack of ankle motion and strength. Heavy UE use on bari 2WW  Distance walked: 20 ft  Assistive device utilized: Environmental Consultant - 2 wheeled and bariatric walker  Level of assistance: CGA and WC follow  Comments: decreased step length  FUNCTIONAL TESTS:  5 times sit to stand: 29 sec with heavy UE assist and walker Timed up and go (TUG): 88 sec walker 10 meter walk test: unable  PATIENT SURVEYS:  FOTO 28 RAG:49  TODAY'S TREATMENT 09/29/23:                                                                                                                          Transfer to mat table to complete below TE TE- To improve strength, endurance, mobility, and function of specific targeted muscle groups or improve joint range of motion or improve muscle flexibility Alternating with  TE- To improve strength, endurance, mobility, and function of specific targeted muscle groups or improve joint range of motion or improve muscle flexibility   Donned 4# AW  TE: LAQ x 15 ea LE  TA: STS x 5 reps from mat table with RW support  TE: LAQ x 15 ea LE  TA: STS x 5 reps  TE: Heel raises on incline seated 2 x15  TA: STS x 5 reps with UE support  TE Standing march with RW x 10 ea LE  Seated ball squeeze 20 x 3 sec holds   TA: Gait with RW x 56ft with CGA/supervision assist Gait training forward and reverse 2 x 8 ft ea   Walk x 6 ft then transfer to [facility] to end session   Noted to have flexed  posture and reduced step height on the BLE.   PATIENT EDUCATION: Education details: POC. Importance of HEP  Person educated: Patient Education method: Explanation Education comprehension: verbalized understanding   HOME EXERCISE PROGRAM: Access Code: [email or web address] URL: [email or web address] Date: 05/30/2023 Prepared by: Lonni Gainer  Exercises - Seated Long Arc Quad  - 1 x daily - 7 x weekly - 2 sets - 10 reps - 3 sec  hold - Seated March  - 1 x daily - 7 x weekly - 2 sets - 10 reps - Seated single leg step to side   - 1 x daily - 7 x weekly - 2 sets - 10 reps - Seated Heel Raise  - 1 x daily - 7 x weekly - 2 sets - 15 reps   GOALS: Goals reviewed with patient? Yes  SHORT TERM GOALS: Target date: 10/07/2023      Patient will be independent in home exercise program  to improve strength/mobility for better functional independence with ADLs. Baseline: 12/17: completing once per week 1/16: states that he has not completed HEP in the last 2 weeks.  Goal status: ONGOING   LONG TERM GOALS: Target date: 11/04/2023     1.  Patient (> 67 years old) will complete five times sit to stand test in <20 seconds and with UE support only on chair ( can use walker in standing for balance but cannot pull on walker) indicating an increased LE strength and improved balance. Baseline: 29 sec with heavy UE assist and walker 12/17: 26.45, UE assist 9 09/08/23: 13 sec with heavy UE support  Goal status: ongoing  2.  Patient will increase FOTO score to equal to or greater than  49   to demonstrate statistically significant improvement in mobility and quality of life.  Baseline: 28 12/17: 48 Goal status: ONGOING    3.  Patient will reduce timed up and go to <11 seconds to reduce fall risk and demonstrate improved transfer/gait ability. Baseline: 88 sec with walker and with Heavy UE use to come to standing position  12/17: 40.35 sec  1/16: 38 sec   Goal status: ONGOING  4.    Patient will increase 10 meter walk test to > .5 m/s as to improve gait speed for better community ambulation and to reduce fall risk. Baseline: Unable to ambulate this distance on eval  12/17: 20.7 sec  09/08/23: 19.015 sec  Goal status: ONGOING    ASSESSMENT:  CLINICAL IMPRESSION:    Pt tolerated all tasks during today's visit. Pt will continue to benefit from skilled physical therapy intervention to address impairments, improve QOL, and attain therapy goals.   OBJECTIVE IMPAIRMENTS: Abnormal gait, decreased activity tolerance, decreased balance, decreased endurance, decreased mobility, difficulty walking, decreased ROM, decreased strength, obesity, and pain.   ACTIVITY LIMITATIONS: carrying, lifting, bending, standing, squatting, stairs, transfers, bed mobility, bathing, toileting, dressing, and locomotion level  PARTICIPATION LIMITATIONS: driving, shopping, community activity, and yard work  PERSONAL FACTORS: Age, Behavior pattern, Fitness, Past/current experiences, Time since onset of injury/illness/exacerbation, and 3+ comorbidities: CHF, COPD, HLD, HTN  are also affecting patient's functional outcome.   REHAB POTENTIAL: Fair previous poor compliance with HEP and with therapy visits.   CLINICAL DECISION MAKING: Stable/uncomplicated  EVALUATION COMPLEXITY: Low  PLAN:  PT FREQUENCY: 2x/week  PT DURATION: 8 weeks  PLANNED INTERVENTIONS: 97110-Therapeutic exercises, 97530- Therapeutic activity, [id number]- Neuromuscular re-education, 97535- Self Care, [id number]- Manual therapy, [phone]- Gait training, [phone]- Prosthetic training, Patient/Family education, Balance training, Stair training, Joint mobilization, DME instructions, Wheelchair mobility training, Cryotherapy, and Moist heat  PLAN FOR NEXT SESSION:   Continue POC. Increase Tolerance to standing. Dynamic balance and standing strengthening.    Lonni KATHEE Gainer, PT, DPT 09/29/2023, 11:30 AM

## 2023-10-04 ENCOUNTER — Ambulatory Visit: Payer: Medicare Other | Admitting: Physical Therapy

## 2023-10-06 ENCOUNTER — Ambulatory Visit: Payer: Medicare Other | Admitting: Physical Therapy

## 2023-10-11 ENCOUNTER — Ambulatory Visit: Payer: Medicare Other | Admitting: Physical Therapy

## 2023-10-13 ENCOUNTER — Ambulatory Visit: Payer: Medicare Other | Admitting: Physical Therapy

## 2023-10-18 ENCOUNTER — Encounter: Payer: Medicare Other | Admitting: Physical Therapy

## 2023-10-20 ENCOUNTER — Ambulatory Visit: Payer: Medicare Other | Admitting: Physical Therapy

## 2023-10-20 NOTE — Therapy (Deleted)
 OUTPATIENT PHYSICAL THERAPY NEURO TREATMENT   Patient Name: Christopher [name] MRN: [id number] DOB:08-20-1951, 73 y.o., male Today's Date: 10/20/2023   PCP: Danella Penton, MD  REFERRING PROVIDER: Roby Lofts, MD    END OF SESSION:            Past Medical History:  Diagnosis Date   Anxiety    CHF (congestive heart failure) (HCC)    COPD (chronic obstructive pulmonary disease) (HCC)    Depression    Dysrhythmia    Hyperlipidemia    Hypertension    Hypothyroidism    Sleep apnea    Past Surgical History:  Procedure Laterality Date   FRACTURE SURGERY     ORIF ANKLE FRACTURE Left 12/17/2020   Procedure: OPEN REDUCTION INTERNAL FIXATION (ORIF) ANKLE FRACTURE;  Surgeon: Gwyneth Revels, DPM;  Location: [facility] ORS;  Service: Podiatry;  Laterality: Left;   ORIF FEMUR FRACTURE Left 01/20/2023   Procedure: OPEN REDUCTION INTERNAL FIXATION (ORIF) DISTAL FEMUR FRACTURE;  Surgeon: Roby Lofts, MD;  Location: [facility] OR;  Service: Orthopedics;  Laterality: Left;   Patient Active Problem List   Diagnosis Date Noted   Sepsis due to cellulitis (HCC) 06/11/2023   Paroxysmal atrial fibrillation (HCC) 06/11/2023   Closed left subtrochanteric femur fracture, initial encounter (HCC) 01/19/2023   CKD (chronic kidney disease) stage 4, GFR 15-29 ml/min (HCC) 01/26/2022   Generalized weakness 10/16/2021   Chronic respiratory failure with hypoxia (HCC) 10/16/2021   Hypotension    SIRS (systemic inflammatory response syndrome) (HCC)    AF (paroxysmal atrial fibrillation) (HCC)    Syncope    Severe sepsis (HCC) 05/20/2021   Pressure injury of skin 04/25/2021   Acute respiratory failure (HCC) 04/21/2021   Acute kidney injury superimposed on CKD (HCC) 04/21/2021   Stage 3b chronic kidney disease (CKD) (HCC) 03/22/2021   Leukocytosis 03/22/2021   Osteomyelitis of ankle or foot, acute, left (HCC) 03/17/2021   Acute on chronic respiratory failure (HCC) 03/17/2021   Osteomyelitis (HCC)  12/16/2020   Depression 12/16/2020   Medicare annual wellness visit, initial 11/14/2019   B12 deficiency 11/14/2019   Hyperlipidemia 10/31/2019   Primary osteoarthritis of right knee 03/16/2019   Sleep apnea 10/31/2018   Traumatic incomplete tear of left rotator cuff 10/02/2018   Morbid obesity with BMI of 45.0-49.9, adult (HCC) 09/20/2018   Hypothyroidism 09/20/2018   Chronic diastolic CHF (congestive heart failure) (HCC) 09/20/2018   Benign essential hypertension 09/20/2018    ONSET DATE: 01/19/23  REFERRING DIAG: H84.696E (ICD-10-CM) - Closed comminuted supracondylar fracture of left femur (HCC)   THERAPY DIAG:  Difficulty in walking, not elsewhere classified  Muscle weakness (generalized)  Unsteadiness on feet  Other abnormalities of gait and mobility  Abnormality of gait and mobility  Rationale for Evaluation and Treatment: Rehabilitation  SUBJECTIVE:  SUBJECTIVE STATEMENT:  Pt had physical yesterday. Pt reports he will by starting a hydrocodone as needed for pain and his MD instructed them to tell his other medical staff. Pt also started magnesium glycenate for heart, nerve and bone health.   Pt accompanied by: family member  PERTINENT HISTORY: ORIF of left supracondylar distal femur on 01/20/23. Went to SNF on 01/25/23 for rehab. Pt is here for femoral fracture. When pt was stepping out of the shower his leg gave out and he fell onto the floor. Pt is working on getting the shower changed to make it a simple walk in shower. Pt  reports most of his falls are in the bathroom. Pt has a bar in the shower but is unable to have a bar on the shower itself due to the set up. Pt is generally inactive and does not walk much around the house, relies on the wheelchair for mobility. Pt wants to get out  of the wheel chair for mobility, at least a little bit. When in rehab over the summer he was walking about 20-25 ft at a time.    PAIN:  Are you having pain? Yes: Pain location: left knee Aggravating factors: certain movements   PRECAUTIONS: Fall  RED FLAGS: None   WEIGHT BEARING RESTRICTIONS: No  FALLS: Has patient fallen in last 6 months? Yes. Number of falls 1  LIVING ENVIRONMENT: Lives with: lives with their family Lives in: House/apartment Stairs: Yes: Internal: 1 steps; none and External: 2 steps; can reach both Has following equipment at home: Dan Humphreys - 2 wheeled, Environmental consultant - 4 wheeled, Wheelchair (manual), shower chair, Grab bars, and grabber to pick things up  PLOF: Requires assistive device for independence, Needs assistance with ADLs, Needs assistance with homemaking, Needs assistance with gait, and Needs assistance with transfers  PATIENT GOALS: Improve his mobility.   OBJECTIVE:   DIAGNOSTIC FINDINGS:from 01/20/23:  " IMPRESSION: ORIF of comminuted distal femur fracture with improved fracture alignment from preoperative imaging"  COGNITION: Overall cognitive status: Within functional limits for tasks assessed   SENSATION: Not tested  COORDINATION: Not tested   EDEMA:  Not measured but B LE swelling seemed to be likely based on pt presentation    LOWER EXTREMITY ROM:     Active  Right Eval Left Eval  Hip flexion    Hip extension    Hip abduction    Hip adduction    Hip internal rotation    Hip external rotation    Knee flexion  95*  Knee extension Unable to achieve full extension in seated  Unable to achieve full extension in seated   Ankle dorsiflexion    Ankle plantarflexion    Ankle inversion    Ankle eversion     (Blank rows = not tested) *indicated measured in seated in WC and could not accurately ID bony landmarks.   LOWER EXTREMITY MMT:    MMT Right Eval Left Eval  Hip flexion 4- 3+  Hip extension    Hip abduction  4-*  Hip  adduction  4-  Hip internal rotation 4 3+  Hip external rotation 4 3  Knee flexion 4 4-  Knee extension 4 4-  Ankle dorsiflexion    Ankle plantarflexion    Ankle inversion    Ankle eversion    (Blank rows = not tested)    TRANSFERS: Assistive device utilized: Environmental consultant - 2 wheeled  Sit to stand: Min A and DTR holds walker, pt uses heavy UE assist  Stand  to sit: CGA Chair to chair: CGA and uses walker, close guarding Floor:  unable  RAMP:   Ramp Comments: uses manual WC propulsion  CURB:   Curb Comments: uses manual WC propulsion  STAIRS:   Comments: not tested at eval, recomment testing at another time.   GAIT: Gait pattern:  left LE in hip ER throughout to make up for lack of ankle motion and strength. Heavy UE use on bari 2WW  Distance walked: 20 ft  Assistive device utilized: Environmental consultant - 2 wheeled and bariatric walker  Level of assistance: CGA and WC follow  Comments: decreased step length  FUNCTIONAL TESTS:  5 times sit to stand: 29 sec with heavy UE assist and walker Timed up and go (TUG): 88 sec walker 10 meter walk test: unable  PATIENT SURVEYS:  FOTO 28 RAG:49  TODAY'S TREATMENT 10/20/23:                                                                                                                          Transfer to mat table to complete below TE TE- To improve strength, endurance, mobility, and function of specific targeted muscle groups or improve joint range of motion or improve muscle flexibility Alternating with  TE- To improve strength, endurance, mobility, and function of specific targeted muscle groups or improve joint range of motion or improve muscle flexibility   Donned 4# AW  TE: LAQ x 15 ea LE  TA: STS x 5 reps from mat table with RW support  TE: LAQ x 15 ea LE  TA: STS x 5 reps  TE: Heel raises on incline seated 2 x15  TA: STS x 5 reps with UE support  TE Standing march with RW x 10 ea LE  Seated ball squeeze 20 x 3 sec holds   TA:  Gait with RW x 30ft with CGA/supervision assist Gait training forward and reverse 2 x 8 ft ea   Walk x 6 ft then transfer to [facility] to end session   Noted to have flexed posture and reduced step height on the BLE.   PATIENT EDUCATION: Education details: POC. Importance of HEP  Person educated: Patient Education method: Explanation Education comprehension: verbalized understanding   HOME EXERCISE PROGRAM: Access Code: [email or web address] URL: [email or web address] Date: 05/30/2023 Prepared by: Thresa Ross  Exercises - Seated Long Arc Quad  - 1 x daily - 7 x weekly - 2 sets - 10 reps - 3 sec  hold - Seated March  - 1 x daily - 7 x weekly - 2 sets - 10 reps - Seated single leg step to side   - 1 x daily - 7 x weekly - 2 sets - 10 reps - Seated Heel Raise  - 1 x daily - 7 x weekly - 2 sets - 15 reps   GOALS: Goals reviewed with patient? Yes  SHORT TERM GOALS: Target date: 10/07/2023      Patient will be independent in home exercise program  to improve strength/mobility for better functional independence with ADLs. Baseline: 12/17: completing once per week 1/16: states that he has not completed HEP in the last 2 weeks.  Goal status: ONGOING   LONG TERM GOALS: Target date: 11/04/2023     1.  Patient (> 58 years old) will complete five times sit to stand test in <20 seconds and with UE support only on chair ( can use walker in standing for balance but cannot pull on walker) indicating an increased LE strength and improved balance. Baseline: 29 sec with heavy UE assist and walker 12/17: 26.45, UE assist 9 09/08/23: 13 sec with heavy UE support  Goal status: ongoing  2.  Patient will increase FOTO score to equal to or greater than  49   to demonstrate statistically significant improvement in mobility and quality of life.  Baseline: 28 12/17: 48 Goal status: ONGOING    3.  Patient will reduce timed up and go to <11 seconds to reduce fall risk and demonstrate improved  transfer/gait ability. Baseline: 88 sec with walker and with Heavy UE use to come to standing position  12/17: 40.35 sec  1/16: 38 sec   Goal status: ONGOING  4.   Patient will increase 10 meter walk test to > .5 m/s as to improve gait speed for better community ambulation and to reduce fall risk. Baseline: Unable to ambulate this distance on eval  12/17: 20.7 sec  09/08/23: 19.015 sec  Goal status: ONGOING    ASSESSMENT:  CLINICAL IMPRESSION:    Pt tolerated all tasks during today's visit. Pt will continue to benefit from skilled physical therapy intervention to address impairments, improve QOL, and attain therapy goals.   OBJECTIVE IMPAIRMENTS: Abnormal gait, decreased activity tolerance, decreased balance, decreased endurance, decreased mobility, difficulty walking, decreased ROM, decreased strength, obesity, and pain.   ACTIVITY LIMITATIONS: carrying, lifting, bending, standing, squatting, stairs, transfers, bed mobility, bathing, toileting, dressing, and locomotion level  PARTICIPATION LIMITATIONS: driving, shopping, community activity, and yard work  PERSONAL FACTORS: Age, Behavior pattern, Fitness, Past/current experiences, Time since onset of injury/illness/exacerbation, and 3+ comorbidities: CHF, COPD, HLD, HTN  are also affecting patient's functional outcome.   REHAB POTENTIAL: Fair previous poor compliance with HEP and with therapy visits.   CLINICAL DECISION MAKING: Stable/uncomplicated  EVALUATION COMPLEXITY: Low  PLAN:  PT FREQUENCY: 2x/week  PT DURATION: 8 weeks  PLANNED INTERVENTIONS: 97110-Therapeutic exercises, 97530- Therapeutic activity, [id number]- Neuromuscular re-education, 97535- Self Care, [id number]- Manual therapy, [phone]- Gait training, [phone]- Prosthetic training, Patient/Family education, Balance training, Stair training, Joint mobilization, DME instructions, Wheelchair mobility training, Cryotherapy, and Moist heat  PLAN FOR NEXT SESSION:   Continue POC.  Increase Tolerance to standing. Dynamic balance and standing strengthening.    Norman Herrlich, PT, DPT 10/20/2023, 8:26 AM

## 2023-10-25 ENCOUNTER — Telehealth: Payer: Self-pay | Admitting: Physical Therapy

## 2023-10-25 ENCOUNTER — Ambulatory Visit: Payer: Medicare Other | Attending: Student | Admitting: Physical Therapy

## 2023-10-25 DIAGNOSIS — R262 Difficulty in walking, not elsewhere classified: Secondary | ICD-10-CM | POA: Insufficient documentation

## 2023-10-25 DIAGNOSIS — R269 Unspecified abnormalities of gait and mobility: Secondary | ICD-10-CM | POA: Insufficient documentation

## 2023-10-25 DIAGNOSIS — R2681 Unsteadiness on feet: Secondary | ICD-10-CM | POA: Insufficient documentation

## 2023-10-25 DIAGNOSIS — M6281 Muscle weakness (generalized): Secondary | ICD-10-CM | POA: Insufficient documentation

## 2023-10-25 DIAGNOSIS — R2689 Other abnormalities of gait and mobility: Secondary | ICD-10-CM | POA: Insufficient documentation

## 2023-10-25 NOTE — Telephone Encounter (Signed)
 Pt contacted via telephone and author left voice mail informing of missed appointment, and attendance policy and requested clinic call back.   Thresa Ross PT, DPT

## 2023-10-27 ENCOUNTER — Ambulatory Visit: Payer: Medicare Other | Admitting: Physical Therapy

## 2023-10-27 DIAGNOSIS — R2681 Unsteadiness on feet: Secondary | ICD-10-CM

## 2023-10-27 DIAGNOSIS — R262 Difficulty in walking, not elsewhere classified: Secondary | ICD-10-CM

## 2023-10-27 DIAGNOSIS — M6281 Muscle weakness (generalized): Secondary | ICD-10-CM

## 2023-10-27 DIAGNOSIS — R269 Unspecified abnormalities of gait and mobility: Secondary | ICD-10-CM | POA: Diagnosis present

## 2023-10-27 DIAGNOSIS — R2689 Other abnormalities of gait and mobility: Secondary | ICD-10-CM | POA: Diagnosis present

## 2023-10-27 NOTE — Therapy (Signed)
 OUTPATIENT PHYSICAL THERAPY NEURO TREATMENT   Patient Name: Christopher [name] MRN: [id number] DOB:04/09/1951, 73 y.o., male Today's Date: 10/27/2023   PCP: Danella Penton, MD  REFERRING PROVIDER: Roby Lofts, MD    END OF SESSION:  PT End of Session - 10/27/23 1611     Visit Number 15    Number of Visits 28    Date for PT Re-Evaluation 11/04/23    Authorization Type [employer]    Progress Note Due on Visit 20    PT Start Time 1615    PT Stop Time 1655    PT Time Calculation (min) 40 min    Equipment Utilized During Treatment Gait belt    Activity Tolerance Patient tolerated treatment well    Behavior During Therapy WFL for tasks assessed/performed                      Past Medical History:  Diagnosis Date   Anxiety    CHF (congestive heart failure) (HCC)    COPD (chronic obstructive pulmonary disease) (HCC)    Depression    Dysrhythmia    Hyperlipidemia    Hypertension    Hypothyroidism    Sleep apnea    Past Surgical History:  Procedure Laterality Date   FRACTURE SURGERY     ORIF ANKLE FRACTURE Left 12/17/2020   Procedure: OPEN REDUCTION INTERNAL FIXATION (ORIF) ANKLE FRACTURE;  Surgeon: Gwyneth Revels, DPM;  Location: [facility];  Service: Podiatry;  Laterality: Left;   ORIF FEMUR FRACTURE Left 01/20/2023   Procedure: OPEN REDUCTION INTERNAL FIXATION (ORIF) DISTAL FEMUR FRACTURE;  Surgeon: Roby Lofts, MD;  Location: [facility] OR;  Service: Orthopedics;  Laterality: Left;   Patient Active Problem List   Diagnosis Date Noted   Sepsis due to cellulitis (HCC) 06/11/2023   Paroxysmal atrial fibrillation (HCC) 06/11/2023   Closed left subtrochanteric femur fracture, initial encounter (HCC) 01/19/2023   CKD (chronic kidney disease) stage 4, GFR 15-29 ml/min (HCC) 01/26/2022   Generalized weakness 10/16/2021   Chronic respiratory failure with hypoxia (HCC) 10/16/2021   Hypotension    SIRS (systemic inflammatory response syndrome) (HCC)    AF  (paroxysmal atrial fibrillation) (HCC)    Syncope    Severe sepsis (HCC) 05/20/2021   Pressure injury of skin 04/25/2021   Acute respiratory failure (HCC) 04/21/2021   Acute kidney injury superimposed on CKD (HCC) 04/21/2021   Stage 3b chronic kidney disease (CKD) (HCC) 03/22/2021   Leukocytosis 03/22/2021   Osteomyelitis of ankle or foot, acute, left (HCC) 03/17/2021   Acute on chronic respiratory failure (HCC) 03/17/2021   Osteomyelitis (HCC) 12/16/2020   Depression 12/16/2020   Medicare annual wellness visit, initial 11/14/2019   B12 deficiency 11/14/2019   Hyperlipidemia 10/31/2019   Primary osteoarthritis of right knee 03/16/2019   Sleep apnea 10/31/2018   Traumatic incomplete tear of left rotator cuff 10/02/2018   Morbid obesity with BMI of 45.0-49.9, adult (HCC) 09/20/2018   Hypothyroidism 09/20/2018   Chronic diastolic CHF (congestive heart failure) (HCC) 09/20/2018   Benign essential hypertension 09/20/2018    ONSET DATE: 01/19/23  REFERRING DIAG: Q46.962X (ICD-10-CM) - Closed comminuted supracondylar fracture of left femur (HCC)   THERAPY DIAG:  Difficulty in walking, not elsewhere classified  Muscle weakness (generalized)  Unsteadiness on feet  Other abnormalities of gait and mobility  Abnormality of gait and mobility  Rationale for Evaluation and Treatment: Rehabilitation  SUBJECTIVE:  SUBJECTIVE STATEMENT:  Pt missed several weeks of PT secondary to illnesses. Pt feeling weak today as a result. Pt informed we would need to submit for more visits and the result will determine future PT visits. Pt and caregiver would like to continue with PT if able.   Pt accompanied by: family member  PERTINENT HISTORY: ORIF of left supracondylar distal femur on 01/20/23. Went to SNF on  01/25/23 for rehab. Pt is here for femoral fracture. When pt was stepping out of the shower his leg gave out and he fell onto the floor. Pt is working on getting the shower changed to make it a simple walk in shower. Pt  reports most of his falls are in the bathroom. Pt has a bar in the shower but is unable to have a bar on the shower itself due to the set up. Pt is generally inactive and does not walk much around the house, relies on the wheelchair for mobility. Pt wants to get out of the wheel chair for mobility, at least a little bit. When in rehab over the summer he was walking about 20-25 ft at a time.    PAIN:  Are you having pain? Yes: Pain location: left knee Aggravating factors: certain movements   PRECAUTIONS: Fall  RED FLAGS: None   WEIGHT BEARING RESTRICTIONS: No  FALLS: Has patient fallen in last 6 months? Yes. Number of falls 1  LIVING ENVIRONMENT: Lives with: lives with their family Lives in: House/apartment Stairs: Yes: Internal: 1 steps; none and External: 2 steps; can reach both Has following equipment at home: Dan Humphreys - 2 wheeled, Environmental consultant - 4 wheeled, Wheelchair (manual), shower chair, Grab bars, and grabber to pick things up  PLOF: Requires assistive device for independence, Needs assistance with ADLs, Needs assistance with homemaking, Needs assistance with gait, and Needs assistance with transfers  PATIENT GOALS: Improve his mobility.   OBJECTIVE:   DIAGNOSTIC FINDINGS:from 01/20/23:  " IMPRESSION: ORIF of comminuted distal femur fracture with improved fracture alignment from preoperative imaging"  COGNITION: Overall cognitive status: Within functional limits for tasks assessed   SENSATION: Not tested  COORDINATION: Not tested   EDEMA:  Not measured but B LE swelling seemed to be likely based on pt presentation    LOWER EXTREMITY ROM:     Active  Right Eval Left Eval  Hip flexion    Hip extension    Hip abduction    Hip adduction    Hip internal  rotation    Hip external rotation    Knee flexion  95*  Knee extension Unable to achieve full extension in seated  Unable to achieve full extension in seated   Ankle dorsiflexion    Ankle plantarflexion    Ankle inversion    Ankle eversion     (Blank rows = not tested) *indicated measured in seated in WC and could not accurately ID bony landmarks.   LOWER EXTREMITY MMT:    MMT Right Eval Left Eval  Hip flexion 4- 3+  Hip extension    Hip abduction  4-*  Hip adduction  4-  Hip internal rotation 4 3+  Hip external rotation 4 3  Knee flexion 4 4-  Knee extension 4 4-  Ankle dorsiflexion    Ankle plantarflexion    Ankle inversion    Ankle eversion    (Blank rows = not tested)    TRANSFERS: Assistive device utilized: Environmental consultant - 2 wheeled  Sit to stand: Min A and DTR holds walker,  pt uses heavy UE assist  Stand to sit: CGA Chair to chair: CGA and uses walker, close guarding Floor:  unable  RAMP:   Ramp Comments: uses manual WC propulsion  CURB:   Curb Comments: uses manual WC propulsion  STAIRS:   Comments: not tested at eval, recomment testing at another time.   GAIT: Gait pattern:  left LE in hip ER throughout to make up for lack of ankle motion and strength. Heavy UE use on bari 2WW  Distance walked: 20 ft  Assistive device utilized: Environmental consultant - 2 wheeled and bariatric walker  Level of assistance: CGA and WC follow  Comments: decreased step length  FUNCTIONAL TESTS:  5 times sit to stand: 29 sec with heavy UE assist and walker Timed up and go (TUG): 88 sec walker 10 meter walk test: unable  PATIENT SURVEYS:  FOTO 28 RAG:49  TODAY'S TREATMENT 10/27/23:                                                                                                                          Transfer to mat table to complete below TE and TA TE- To improve strength, endurance, mobility, and function of specific targeted muscle groups or improve joint range of motion or improve  muscle flexibility Alternating with   Donned 5# AW  TA: STS x 5 reps from mat table with RW support  TE: LAQ x 10 ea LE  TA: STS x 5 reps from mat table with RW support - fatigue noted  TE: LAQ x 10 ea LE  TA: standing with 10 ec intervals of no UE assist  TE: Heel raises on incline seated 2 x15  Doffed AW   TE Seated ball squeeze 20 x 3 sec holds   TA:  Gait with B RW x 20 ft with turns  Forward and retro gait 2 x 8 ft ea  Lateral steps x 5 ea direction x 2 sets  Walk x 6 ft then transfer to [facility] to end session   Pt required occasional rest breaks due fatigue, PT was attentive to when pt appeared to be tired or winded in order to prevent excessive fatigue. Pt showing increased fatigue this date after missing a lot of PT secondary to illness.   PATIENT EDUCATION: Education details: POC. Importance of HEP  Person educated: Patient Education method: Explanation Education comprehension: verbalized understanding   HOME EXERCISE PROGRAM: Access Code: [email or web address] URL: [email or web address] Date: 05/30/2023 Prepared by: Thresa Ross  Exercises - Seated Long Arc Quad  - 1 x daily - 7 x weekly - 2 sets - 10 reps - 3 sec  hold - Seated March  - 1 x daily - 7 x weekly - 2 sets - 10 reps - Seated single leg step to side   - 1 x daily - 7 x weekly - 2 sets - 10 reps - Seated Heel Raise  - 1 x daily - 7 x weekly - 2 sets -  15 reps   GOALS: Goals reviewed with patient? Yes  SHORT TERM GOALS: Target date: 10/07/2023      Patient will be independent in home exercise program to improve strength/mobility for better functional independence with ADLs. Baseline: 12/17: completing once per week 1/16: states that he has not completed HEP in the last 2 weeks.  Goal status: ONGOING   LONG TERM GOALS: Target date: 11/04/2023     1.  Patient (> 32 years old) will complete five times sit to stand test in <20 seconds and with UE support only on chair ( can use walker in  standing for balance but cannot pull on walker) indicating an increased LE strength and improved balance. Baseline: 29 sec with heavy UE assist and walker 12/17: 26.45, UE assist 9 09/08/23: 13 sec with heavy UE support  Goal status: ongoing  2.  Patient will increase FOTO score to equal to or greater than  49   to demonstrate statistically significant improvement in mobility and quality of life.  Baseline: 28 12/17: 48 Goal status: ONGOING    3.  Patient will reduce timed up and go to <11 seconds to reduce fall risk and demonstrate improved transfer/gait ability. Baseline: 88 sec with walker and with Heavy UE use to come to standing position  12/17: 40.35 sec  1/16: 38 sec   Goal status: ONGOING  4.   Patient will increase 10 meter walk test to > .5 m/s as to improve gait speed for better community ambulation and to reduce fall risk. Baseline: Unable to ambulate this distance on eval  12/17: 20.7 sec  09/08/23: 19.015 sec  Goal status: ONGOING    ASSESSMENT:  CLINICAL IMPRESSION:    Pt tolerated all tasks during today's visit. {T showing increased weakness compared to previous visit. Pt missed 5 weeks pt PT sessions secondary to illness. Pt informed new submission for more visits will determine if he is able to complete more therapy. Pt would like to continue working on his strength and mobility. Pt instructed to work on moving throughout the day even if it is just more ambulation per transfer it will go along way for his mobility. Patient's condition has the potential to improve in response to therapy. Maximum improvement is yet to be obtained. The anticipated improvement is attainable and reasonable in a generally predictable time.   Pt will continue to benefit from skilled physical therapy intervention to address impairments, improve QOL, and attain therapy goals.   OBJECTIVE IMPAIRMENTS: Abnormal gait, decreased activity tolerance, decreased balance, decreased endurance, decreased  mobility, difficulty walking, decreased ROM, decreased strength, obesity, and pain.   ACTIVITY LIMITATIONS: carrying, lifting, bending, standing, squatting, stairs, transfers, bed mobility, bathing, toileting, dressing, and locomotion level  PARTICIPATION LIMITATIONS: driving, shopping, community activity, and yard work  PERSONAL FACTORS: Age, Behavior pattern, Fitness, Past/current experiences, Time since onset of injury/illness/exacerbation, and 3+ comorbidities: CHF, COPD, HLD, HTN  are also affecting patient's functional outcome.   REHAB POTENTIAL: Fair previous poor compliance with HEP and with therapy visits.   CLINICAL DECISION MAKING: Stable/uncomplicated  EVALUATION COMPLEXITY: Low  PLAN:  PT FREQUENCY: 2x/week  PT DURATION: 8 weeks  PLANNED INTERVENTIONS: 97110-Therapeutic exercises, 97530- Therapeutic activity, [id number]- Neuromuscular re-education, 97535- Self Care, [id number]- Manual therapy, [phone]- Gait training, [phone]- Prosthetic training, Patient/Family education, Balance training, Stair training, Joint mobilization, DME instructions, Wheelchair mobility training, Cryotherapy, and Moist heat  PLAN FOR NEXT SESSION:   Continue POC. Increase Tolerance to standing. Dynamic balance and standing strengthening.  Norman Herrlich, PT, DPT 10/27/2023, 5:03 PM

## 2023-11-03 ENCOUNTER — Ambulatory Visit: Payer: Medicare Other | Admitting: Physical Therapy

## 2023-11-03 NOTE — Therapy (Deleted)
 OUTPATIENT PHYSICAL THERAPY NEURO TREATMENT/ RECERT ***   Patient Name: Christopher [name] MRN: [id number] DOB:1951-03-13, 73 y.o., male Today's Date: 11/03/2023   PCP: Danella Penton, MD  REFERRING PROVIDER: Roby Lofts, MD    END OF SESSION:             Past Medical History:  Diagnosis Date   Anxiety    CHF (congestive heart failure) (HCC)    COPD (chronic obstructive pulmonary disease) (HCC)    Depression    Dysrhythmia    Hyperlipidemia    Hypertension    Hypothyroidism    Sleep apnea    Past Surgical History:  Procedure Laterality Date   FRACTURE SURGERY     ORIF ANKLE FRACTURE Left 12/17/2020   Procedure: OPEN REDUCTION INTERNAL FIXATION (ORIF) ANKLE FRACTURE;  Surgeon: Gwyneth Revels, DPM;  Location: [facility];  Service: Podiatry;  Laterality: Left;   ORIF FEMUR FRACTURE Left 01/20/2023   Procedure: OPEN REDUCTION INTERNAL FIXATION (ORIF) DISTAL FEMUR FRACTURE;  Surgeon: Roby Lofts, MD;  Location: [facility] OR;  Service: Orthopedics;  Laterality: Left;   Patient Active Problem List   Diagnosis Date Noted   Sepsis due to cellulitis (HCC) 06/11/2023   Paroxysmal atrial fibrillation (HCC) 06/11/2023   Closed left subtrochanteric femur fracture, initial encounter (HCC) 01/19/2023   CKD (chronic kidney disease) stage 4, GFR 15-29 ml/min (HCC) 01/26/2022   Generalized weakness 10/16/2021   Chronic respiratory failure with hypoxia (HCC) 10/16/2021   Hypotension    SIRS (systemic inflammatory response syndrome) (HCC)    AF (paroxysmal atrial fibrillation) (HCC)    Syncope    Severe sepsis (HCC) 05/20/2021   Pressure injury of skin 04/25/2021   Acute respiratory failure (HCC) 04/21/2021   Acute kidney injury superimposed on CKD (HCC) 04/21/2021   Stage 3b chronic kidney disease (CKD) (HCC) 03/22/2021   Leukocytosis 03/22/2021   Osteomyelitis of ankle or foot, acute, left (HCC) 03/17/2021   Acute on chronic respiratory failure (HCC) 03/17/2021    Osteomyelitis (HCC) 12/16/2020   Depression 12/16/2020   Medicare annual wellness visit, initial 11/14/2019   B12 deficiency 11/14/2019   Hyperlipidemia 10/31/2019   Primary osteoarthritis of right knee 03/16/2019   Sleep apnea 10/31/2018   Traumatic incomplete tear of left rotator cuff 10/02/2018   Morbid obesity with BMI of 45.0-49.9, adult (HCC) 09/20/2018   Hypothyroidism 09/20/2018   Chronic diastolic CHF (congestive heart failure) (HCC) 09/20/2018   Benign essential hypertension 09/20/2018    ONSET DATE: 01/19/23  REFERRING DIAG: W09.811B (ICD-10-CM) - Closed comminuted supracondylar fracture of left femur (HCC)   THERAPY DIAG:  No diagnosis found.  Rationale for Evaluation and Treatment: Rehabilitation  SUBJECTIVE:  SUBJECTIVE STATEMENT:  Pt missed several weeks of PT secondary to illnesses. Pt feeling weak today as a result. Pt informed we would need to submit for more visits and the result will determine future PT visits. Pt and caregiver would like to continue with PT if able.   Pt accompanied by: family member  PERTINENT HISTORY: ORIF of left supracondylar distal femur on 01/20/23. Went to SNF on 01/25/23 for rehab. Pt is here for femoral fracture. When pt was stepping out of the shower his leg gave out and he fell onto the floor. Pt is working on getting the shower changed to make it a simple walk in shower. Pt  reports most of his falls are in the bathroom. Pt has a bar in the shower but is unable to have a bar on the shower itself due to the set up. Pt is generally inactive and does not walk much around the house, relies on the wheelchair for mobility. Pt wants to get out of the wheel chair for mobility, at least a little bit. When in rehab over the summer he was walking about 20-25 ft at a  time.    PAIN:  Are you having pain? Yes: Pain location: left knee Aggravating factors: certain movements   PRECAUTIONS: Fall  RED FLAGS: None   WEIGHT BEARING RESTRICTIONS: No  FALLS: Has patient fallen in last 6 months? Yes. Number of falls 1  LIVING ENVIRONMENT: Lives with: lives with their family Lives in: House/apartment Stairs: Yes: Internal: 1 steps; none and External: 2 steps; can reach both Has following equipment at home: Dan Humphreys - 2 wheeled, Environmental consultant - 4 wheeled, Wheelchair (manual), shower chair, Grab bars, and grabber to pick things up  PLOF: Requires assistive device for independence, Needs assistance with ADLs, Needs assistance with homemaking, Needs assistance with gait, and Needs assistance with transfers  PATIENT GOALS: Improve his mobility.   OBJECTIVE:   DIAGNOSTIC FINDINGS:from 01/20/23:  " IMPRESSION: ORIF of comminuted distal femur fracture with improved fracture alignment from preoperative imaging"  COGNITION: Overall cognitive status: Within functional limits for tasks assessed   SENSATION: Not tested  COORDINATION: Not tested   EDEMA:  Not measured but B LE swelling seemed to be likely based on pt presentation    LOWER EXTREMITY ROM:     Active  Right Eval Left Eval  Hip flexion    Hip extension    Hip abduction    Hip adduction    Hip internal rotation    Hip external rotation    Knee flexion  95*  Knee extension Unable to achieve full extension in seated  Unable to achieve full extension in seated   Ankle dorsiflexion    Ankle plantarflexion    Ankle inversion    Ankle eversion     (Blank rows = not tested) *indicated measured in seated in WC and could not accurately ID bony landmarks.   LOWER EXTREMITY MMT:    MMT Right Eval Left Eval  Hip flexion 4- 3+  Hip extension    Hip abduction  4-*  Hip adduction  4-  Hip internal rotation 4 3+  Hip external rotation 4 3  Knee flexion 4 4-  Knee extension 4 4-  Ankle  dorsiflexion    Ankle plantarflexion    Ankle inversion    Ankle eversion    (Blank rows = not tested)    TRANSFERS: Assistive device utilized: Environmental consultant - 2 wheeled  Sit to stand: Min A and DTR holds walker,  pt uses heavy UE assist  Stand to sit: CGA Chair to chair: CGA and uses walker, close guarding Floor:  unable  RAMP:   Ramp Comments: uses manual WC propulsion  CURB:   Curb Comments: uses manual WC propulsion  STAIRS:   Comments: not tested at eval, recomment testing at another time.   GAIT: Gait pattern:  left LE in hip ER throughout to make up for lack of ankle motion and strength. Heavy UE use on bari 2WW  Distance walked: 20 ft  Assistive device utilized: Environmental consultant - 2 wheeled and bariatric walker  Level of assistance: CGA and WC follow  Comments: decreased step length  FUNCTIONAL TESTS:  5 times sit to stand: 29 sec with heavy UE assist and walker Timed up and go (TUG): 88 sec walker 10 meter walk test: unable  PATIENT SURVEYS:  FOTO 28 RAG:49  TODAY'S TREATMENT 11/03/23:           Address Goals ***  PT instructed pt in TUG: *** sec ( >13.5 sec indicates increased fall risk) Five times Sit to Stand Test (FTSS)  TIME: *** sec  Cut off scores indicative of increased fall risk: >12 sec CVA, >16 sec PD, >13 sec vestibular (ANPTA Core Set of Outcome Measures for Adults with Neurologic Conditions, 2018) 10 Meter Walk Test: Patient instructed to walk 10 meters (32.8 ft) as quickly and as safely as possible at their normal speed x2  Normal speed 1: *** m/s Normal speed 2: *** m/s Average Normal speed: *** m/s Cut off scores: <0.4 m/s = household Ambulator, 0.4-0.8 m/s = limited community Ambulator, >0.8 m/s = community Ambulator, >1.2 m/s = crossing a street, <1.0 = increased fall risk MCID 0.05 m/s (small), 0.13 m/s (moderate) (ANPTA Core Set of Outcome Measures for Adults with Neurologic Conditions, 2018)                                                                                                                    Transfer to mat table to complete below TE and TA TE- To improve strength, endurance, mobility, and function of specific targeted muscle groups or improve joint range of motion or improve muscle flexibility Alternating with   Donned 5# AW  TA: STS x 5 reps from mat table with RW support  TE: LAQ x 10 ea LE  TA: STS x 5 reps from mat table with RW support - fatigue noted  TE: LAQ x 10 ea LE  TA: standing with 10 ec intervals of no UE assist  TE: Heel raises on incline seated 2 x15  Doffed AW   TE Seated ball squeeze 20 x 3 sec holds   TA:  Gait with B RW x 20 ft with turns  Forward and retro gait 2 x 8 ft ea  Lateral steps x 5 ea direction x 2 sets  Walk x 6 ft then transfer to [facility] to end session   Pt required occasional rest breaks due fatigue, PT was attentive to  when pt appeared to be tired or winded in order to prevent excessive fatigue. Pt showing increased fatigue this date after missing a lot of PT secondary to illness.   PATIENT EDUCATION: Education details: POC. Importance of HEP  Person educated: Patient Education method: Explanation Education comprehension: verbalized understanding   HOME EXERCISE PROGRAM: Access Code: [email or web address] URL: [email or web address] Date: 05/30/2023 Prepared by: Thresa Ross  Exercises - Seated Long Arc Quad  - 1 x daily - 7 x weekly - 2 sets - 10 reps - 3 sec  hold - Seated March  - 1 x daily - 7 x weekly - 2 sets - 10 reps - Seated single leg step to side   - 1 x daily - 7 x weekly - 2 sets - 10 reps - Seated Heel Raise  - 1 x daily - 7 x weekly - 2 sets - 15 reps   GOALS: Goals reviewed with patient? Yes  SHORT TERM GOALS: Target date: 10/07/2023      Patient will be independent in home exercise program to improve strength/mobility for better functional independence with ADLs. Baseline: 12/17: completing once per week 1/16: states that he has not  completed HEP in the last 2 weeks.  Goal status: ONGOING   LONG TERM GOALS: Target date: 11/04/2023     1.  Patient (> 48 years old) will complete five times sit to stand test in <20 seconds and with UE support only on chair ( can use walker in standing for balance but cannot pull on walker) indicating an increased LE strength and improved balance. Baseline: 29 sec with heavy UE assist and walker 12/17: 26.45, UE assist 9 09/08/23: 13 sec with heavy UE support  Goal status: ongoing  2.  Patient will increase FOTO score to equal to or greater than  49   to demonstrate statistically significant improvement in mobility and quality of life.  Baseline: 28 12/17: 48 Goal status: DISCONTINUED     3.  Patient will reduce timed up and go to <11 seconds to reduce fall risk and demonstrate improved transfer/gait ability. Baseline: 88 sec with walker and with Heavy UE use to come to standing position  12/17: 40.35 sec  1/16: 38 sec   Goal status: ONGOING  4.   Patient will increase 10 meter walk test to > .5 m/s as to improve gait speed for better community ambulation and to reduce fall risk. Baseline: Unable to ambulate this distance on eval  12/17: 20.7 sec  09/08/23: 19.015 sec  Goal status: ONGOING    ASSESSMENT:  CLINICAL IMPRESSION:    Pt tolerated all tasks during today's visit. {T showing increased weakness compared to previous visit. Pt missed 5 weeks pt PT sessions secondary to illness. Pt informed new submission for more visits will determine if he is able to complete more therapy. Pt would like to continue working on his strength and mobility. Pt instructed to work on moving throughout the day even if it is just more ambulation per transfer it will go along way for his mobility. Patient's condition has the potential to improve in response to therapy. Maximum improvement is yet to be obtained. The anticipated improvement is attainable and reasonable in a generally predictable time.    Pt will continue to benefit from skilled physical therapy intervention to address impairments, improve QOL, and attain therapy goals.   OBJECTIVE IMPAIRMENTS: Abnormal gait, decreased activity tolerance, decreased balance, decreased endurance, decreased mobility, difficulty walking, decreased ROM, decreased strength, obesity, and  pain.   ACTIVITY LIMITATIONS: carrying, lifting, bending, standing, squatting, stairs, transfers, bed mobility, bathing, toileting, dressing, and locomotion level  PARTICIPATION LIMITATIONS: driving, shopping, community activity, and yard work  PERSONAL FACTORS: Age, Behavior pattern, Fitness, Past/current experiences, Time since onset of injury/illness/exacerbation, and 3+ comorbidities: CHF, COPD, HLD, HTN  are also affecting patient's functional outcome.   REHAB POTENTIAL: Fair previous poor compliance with HEP and with therapy visits.   CLINICAL DECISION MAKING: Stable/uncomplicated  EVALUATION COMPLEXITY: Low  PLAN:  PT FREQUENCY: 2x/week  PT DURATION: 8 weeks  PLANNED INTERVENTIONS: 97110-Therapeutic exercises, 97530- Therapeutic activity, [id number]- Neuromuscular re-education, 97535- Self Care, [id number]- Manual therapy, [phone]- Gait training, [phone]- Prosthetic training, Patient/Family education, Balance training, Stair training, Joint mobilization, DME instructions, Wheelchair mobility training, Cryotherapy, and Moist heat  PLAN FOR NEXT SESSION:   Continue POC. Increase Tolerance to standing. Dynamic balance and standing strengthening.    Norman Herrlich, PT, DPT 11/03/2023, 8:16 AM

## 2023-11-08 ENCOUNTER — Ambulatory Visit: Payer: Medicare Other

## 2023-11-10 ENCOUNTER — Ambulatory Visit: Payer: Medicare Other | Admitting: Physical Therapy

## 2024-01-26 ENCOUNTER — Inpatient Hospital Stay
Admission: EM | Admit: 2024-01-26 | Discharge: 2024-01-30 | DRG: 872 | Disposition: A | Discharge status: Home Health | Attending: Internal Medicine | Admitting: Internal Medicine

## 2024-01-26 ENCOUNTER — Emergency Department

## 2024-01-26 ENCOUNTER — Other Ambulatory Visit: Payer: Self-pay

## 2024-01-26 DIAGNOSIS — Z7951 Long term (current) use of inhaled steroids: Secondary | ICD-10-CM

## 2024-01-26 DIAGNOSIS — Z6841 Body Mass Index (BMI) 40.0 and over, adult: Secondary | ICD-10-CM

## 2024-01-26 DIAGNOSIS — I110 Hypertensive heart disease with heart failure: Secondary | ICD-10-CM | POA: Diagnosis present

## 2024-01-26 DIAGNOSIS — Z7409 Other reduced mobility: Secondary | ICD-10-CM | POA: Diagnosis present

## 2024-01-26 DIAGNOSIS — L89152 Pressure ulcer of sacral region, stage 2: Secondary | ICD-10-CM | POA: Diagnosis present

## 2024-01-26 DIAGNOSIS — E785 Hyperlipidemia, unspecified: Secondary | ICD-10-CM | POA: Diagnosis present

## 2024-01-26 DIAGNOSIS — Z7982 Long term (current) use of aspirin: Secondary | ICD-10-CM

## 2024-01-26 DIAGNOSIS — F32A Depression, unspecified: Secondary | ICD-10-CM | POA: Diagnosis present

## 2024-01-26 DIAGNOSIS — E039 Hypothyroidism, unspecified: Secondary | ICD-10-CM | POA: Diagnosis present

## 2024-01-26 DIAGNOSIS — G4733 Obstructive sleep apnea (adult) (pediatric): Secondary | ICD-10-CM | POA: Diagnosis present

## 2024-01-26 DIAGNOSIS — E66813 Obesity, class 3: Secondary | ICD-10-CM | POA: Diagnosis present

## 2024-01-26 DIAGNOSIS — L03317 Cellulitis of buttock: Secondary | ICD-10-CM

## 2024-01-26 DIAGNOSIS — J9611 Chronic respiratory failure with hypoxia: Secondary | ICD-10-CM | POA: Diagnosis present

## 2024-01-26 DIAGNOSIS — Z8249 Family history of ischemic heart disease and other diseases of the circulatory system: Secondary | ICD-10-CM | POA: Diagnosis not present

## 2024-01-26 DIAGNOSIS — A419 Sepsis, unspecified organism: Principal | ICD-10-CM | POA: Diagnosis present

## 2024-01-26 DIAGNOSIS — I10 Essential (primary) hypertension: Secondary | ICD-10-CM | POA: Diagnosis present

## 2024-01-26 DIAGNOSIS — R6520 Severe sepsis without septic shock: Secondary | ICD-10-CM | POA: Diagnosis present

## 2024-01-26 DIAGNOSIS — Z79899 Other long term (current) drug therapy: Secondary | ICD-10-CM

## 2024-01-26 DIAGNOSIS — G4730 Sleep apnea, unspecified: Secondary | ICD-10-CM | POA: Diagnosis present

## 2024-01-26 DIAGNOSIS — E872 Acidosis, unspecified: Secondary | ICD-10-CM | POA: Diagnosis present

## 2024-01-26 DIAGNOSIS — I5032 Chronic diastolic (congestive) heart failure: Secondary | ICD-10-CM | POA: Diagnosis present

## 2024-01-26 DIAGNOSIS — L0390 Cellulitis, unspecified: Secondary | ICD-10-CM | POA: Diagnosis present

## 2024-01-26 DIAGNOSIS — J449 Chronic obstructive pulmonary disease, unspecified: Secondary | ICD-10-CM | POA: Diagnosis present

## 2024-01-26 DIAGNOSIS — Z634 Disappearance and death of family member: Secondary | ICD-10-CM | POA: Diagnosis not present

## 2024-01-26 DIAGNOSIS — Z888 Allergy status to other drugs, medicaments and biological substances status: Secondary | ICD-10-CM

## 2024-01-26 DIAGNOSIS — Z79890 Hormone replacement therapy (postmenopausal): Secondary | ICD-10-CM | POA: Diagnosis not present

## 2024-01-26 DIAGNOSIS — I959 Hypotension, unspecified: Secondary | ICD-10-CM | POA: Diagnosis not present

## 2024-01-26 DIAGNOSIS — Z9981 Dependence on supplemental oxygen: Secondary | ICD-10-CM | POA: Diagnosis not present

## 2024-01-26 DIAGNOSIS — Z91199 Patient's noncompliance with other medical treatment and regimen due to unspecified reason: Secondary | ICD-10-CM

## 2024-01-26 DIAGNOSIS — I480 Paroxysmal atrial fibrillation: Secondary | ICD-10-CM | POA: Diagnosis present

## 2024-01-26 DIAGNOSIS — Z7952 Long term (current) use of systemic steroids: Secondary | ICD-10-CM

## 2024-01-26 DIAGNOSIS — Z885 Allergy status to narcotic agent status: Secondary | ICD-10-CM

## 2024-01-26 DIAGNOSIS — J302 Other seasonal allergic rhinitis: Secondary | ICD-10-CM | POA: Diagnosis present

## 2024-01-26 LAB — CBC WITH DIFFERENTIAL/PLATELET
Abs Immature Granulocytes: 0.1 10*3/uL — ABNORMAL HIGH (ref 0.00–0.07)
Basophils Absolute: 0.1 10*3/uL (ref 0.0–0.1)
Basophils Relative: 0 %
Eosinophils Absolute: 0 10*3/uL (ref 0.0–0.5)
Eosinophils Relative: 0 %
HCT: 43.1 % (ref 39.0–52.0)
Hemoglobin: 13.4 g/dL (ref 13.0–17.0)
Immature Granulocytes: 1 %
Lymphocytes Relative: 2 %
Lymphs Abs: 0.3 10*3/uL — ABNORMAL LOW (ref 0.7–4.0)
MCH: 26.7 pg (ref 26.0–34.0)
MCHC: 31.1 g/dL (ref 30.0–36.0)
MCV: 85.9 fL (ref 80.0–100.0)
Monocytes Absolute: 0.8 10*3/uL (ref 0.1–1.0)
Monocytes Relative: 4 %
Neutro Abs: 16.7 10*3/uL — ABNORMAL HIGH (ref 1.7–7.7)
Neutrophils Relative %: 93 %
Platelets: 239 10*3/uL (ref 150–400)
RBC: 5.02 MIL/uL (ref 4.22–5.81)
RDW: 15.4 % (ref 11.5–15.5)
Smear Review: NORMAL
WBC: 18 10*3/uL — ABNORMAL HIGH (ref 4.0–10.5)
nRBC: 0 % (ref 0.0–0.2)

## 2024-01-26 LAB — LACTIC ACID, PLASMA
Lactic Acid, Venous: 2 mmol/L (ref 0.5–1.9)
Lactic Acid, Venous: 3 mmol/L (ref 0.5–1.9)
Lactic Acid, Venous: 1.2 mmol/L (ref 0.5–1.9)
Lactic Acid, Venous: 1.3 mmol/L (ref 0.5–1.9)

## 2024-01-26 LAB — URINALYSIS, W/ REFLEX TO CULTURE (INFECTION SUSPECTED)
Bacteria, UA: NONE SEEN
Bilirubin Urine: NEGATIVE
Glucose, UA: NEGATIVE mg/dL
Hgb urine dipstick: NEGATIVE
Ketones, ur: NEGATIVE mg/dL
Nitrite: NEGATIVE
Protein, ur: NEGATIVE mg/dL
Specific Gravity, Urine: 1.02 (ref 1.005–1.030)
Squamous Epithelial / HPF: 0 /HPF (ref 0–5)
pH: 6 (ref 5.0–8.0)

## 2024-01-26 LAB — COMPREHENSIVE METABOLIC PANEL WITH GFR
ALT: 9 U/L (ref 0–44)
AST: 19 U/L (ref 15–41)
Albumin: 3.5 g/dL (ref 3.5–5.0)
Alkaline Phosphatase: 49 U/L (ref 38–126)
Anion gap: 9 (ref 5–15)
BUN: 30 mg/dL — ABNORMAL HIGH (ref 8–23)
CO2: 25 mmol/L (ref 22–32)
Calcium: 8.6 mg/dL — ABNORMAL LOW (ref 8.9–10.3)
Chloride: 104 mmol/L (ref 98–111)
Creatinine, Ser: 1.46 mg/dL — ABNORMAL HIGH (ref 0.61–1.24)
GFR, Estimated: 50 mL/min — ABNORMAL LOW (ref >60)
Glucose, Bld: 145 mg/dL — ABNORMAL HIGH (ref 70–99)
Potassium: 4.6 mmol/L (ref 3.5–5.1)
Sodium: 138 mmol/L (ref 135–145)
Total Bilirubin: 0.8 mg/dL (ref 0.0–1.2)
Total Protein: 6.4 g/dL — ABNORMAL LOW (ref 6.5–8.1)

## 2024-01-26 LAB — TSH: TSH: 2.58 u[IU]/mL (ref 0.350–4.500)

## 2024-01-26 LAB — TROPONIN I (HIGH SENSITIVITY)
Troponin I (High Sensitivity): 8 ng/L (ref <18)
Troponin I (High Sensitivity): 7 ng/L (ref <18)

## 2024-01-26 LAB — T4, FREE: Free T4: 0.86 ng/dL (ref 0.61–1.12)

## 2024-01-26 LAB — PROTIME-INR
INR: 1.2 (ref 0.8–1.2)
Prothrombin Time: 15 s (ref 11.4–15.2)

## 2024-01-26 MED ORDER — LEVOTHYROXINE SODIUM 88 MCG PO TABS
88.0000 ug | ORAL_TABLET | Freq: Every day | ORAL | Status: DC
  Administered 2024-01-26 – 2024-01-30 (×4): 88 ug via ORAL
  Filled 2024-01-26 (×6): qty 1

## 2024-01-26 MED ORDER — VANCOMYCIN HCL 1500 MG/300ML IV SOLN
1500.0000 mg | INTRAVENOUS | Status: DC
  Administered 2024-01-27: 1500 mg via INTRAVENOUS
  Filled 2024-01-26 (×2): qty 300

## 2024-01-26 MED ORDER — SODIUM CHLORIDE 0.9 % IV BOLUS
500.0000 mL | Freq: Once | INTRAVENOUS | Status: AC
  Administered 2024-01-26: 500 mL via INTRAVENOUS

## 2024-01-26 MED ORDER — HYDROCODONE-ACETAMINOPHEN 5-325 MG PO TABS: 1.0000 | ORAL_TABLET | ORAL | Status: DC | PRN

## 2024-01-26 MED ORDER — SODIUM CHLORIDE 0.9 % IV BOLUS (SEPSIS)
2000.0000 mL | Freq: Once | INTRAVENOUS | Status: AC
  Administered 2024-01-26: 2000 mL via INTRAVENOUS

## 2024-01-26 MED ORDER — ALBUTEROL SULFATE (2.5 MG/3ML) 0.083% IN NEBU
2.5000 mg | INHALATION_SOLUTION | RESPIRATORY_TRACT | Status: DC | PRN
Start: 2024-01-26 — End: 2024-01-30

## 2024-01-26 MED ORDER — ONDANSETRON HCL 4 MG PO TABS
4.0000 mg | ORAL_TABLET | Freq: Four times a day (QID) | ORAL | Status: DC | PRN
Start: 2024-01-26 — End: 2024-01-30

## 2024-01-26 MED ORDER — VANCOMYCIN HCL IN DEXTROSE 1-5 GM/200ML-% IV SOLN
1000.0000 mg | Freq: Once | INTRAVENOUS | Status: AC
  Administered 2024-01-26: 1000 mg via INTRAVENOUS
  Filled 2024-01-26: qty 200

## 2024-01-26 MED ORDER — PIPERACILLIN-TAZOBACTAM 3.375 G IVPB 30 MIN
3.3750 g | Freq: Once | INTRAVENOUS | Status: AC
Start: 2024-01-26 — End: 2024-01-26
  Administered 2024-01-26: 3.375 g via INTRAVENOUS
  Filled 2024-01-26 (×2): qty 50

## 2024-01-26 MED ORDER — VANCOMYCIN HCL 1500 MG/300ML IV SOLN
1500.0000 mg | Freq: Once | INTRAVENOUS | Status: AC
  Administered 2024-01-26: 1500 mg via INTRAVENOUS
  Filled 2024-01-26: qty 300

## 2024-01-26 MED ORDER — APIXABAN 5 MG PO TABS: 5.0000 mg | ORAL_TABLET | Freq: Two times a day (BID) | ORAL | Status: DC

## 2024-01-26 MED ORDER — ONDANSETRON HCL 4 MG/2ML IJ SOLN
4.0000 mg | Freq: Four times a day (QID) | INTRAMUSCULAR | Status: DC | PRN
  Administered 2024-01-26: 4 mg via INTRAVENOUS
  Filled 2024-01-26 (×2): qty 2

## 2024-01-26 MED ORDER — PIPERACILLIN-TAZOBACTAM 3.375 G IVPB
3.3750 g | Freq: Three times a day (TID) | INTRAVENOUS | Status: DC
  Administered 2024-01-26 – 2024-01-27 (×3): 3.375 g via INTRAVENOUS
  Filled 2024-01-26 (×3): qty 50

## 2024-01-26 MED ORDER — SODIUM CHLORIDE 0.9 % IV SOLN: INTRAVENOUS | Status: AC

## 2024-01-26 MED ORDER — ATORVASTATIN CALCIUM 20 MG PO TABS
20.0000 mg | ORAL_TABLET | Freq: Every day | ORAL | Status: DC
  Administered 2024-01-26 – 2024-01-29 (×4): 20 mg via ORAL
  Filled 2024-01-26 (×4): qty 1

## 2024-01-26 MED ORDER — ACETAMINOPHEN 325 MG PO TABS: 650.0000 mg | ORAL_TABLET | Freq: Four times a day (QID) | ORAL | Status: DC | PRN

## 2024-01-26 MED ORDER — ENOXAPARIN SODIUM 80 MG/0.8ML IJ SOSY
70.0000 mg | PREFILLED_SYRINGE | INTRAMUSCULAR | Status: DC
  Administered 2024-01-26 – 2024-01-29 (×4): 70 mg via SUBCUTANEOUS
  Filled 2024-01-26 (×4): qty 0.8

## 2024-01-26 MED ORDER — SODIUM CHLORIDE 0.9 % IV BOLUS
1000.0000 mL | Freq: Once | INTRAVENOUS | Status: AC
  Administered 2024-01-26: 1000 mL via INTRAVENOUS

## 2024-01-26 MED ORDER — ACETAMINOPHEN 650 MG RE SUPP: 650.0000 mg | Freq: Four times a day (QID) | RECTAL | Status: DC | PRN

## 2024-01-26 MED ORDER — IOHEXOL 350 MG/ML SOLN
100.0000 mL | Freq: Once | INTRAVENOUS | Status: AC | PRN
  Administered 2024-01-26: 100 mL via INTRAVENOUS

## 2024-01-26 MED ORDER — ASPIRIN 81 MG PO TBEC
81.0000 mg | DELAYED_RELEASE_TABLET | Freq: Every day | ORAL | Status: DC
  Administered 2024-01-26 – 2024-01-29 (×4): 81 mg via ORAL
  Filled 2024-01-26 (×4): qty 1

## 2024-01-26 MED ORDER — MIDODRINE HCL 5 MG PO TABS
10.0000 mg | ORAL_TABLET | Freq: Three times a day (TID) | ORAL | Status: DC
  Administered 2024-01-26 – 2024-01-30 (×12): 10 mg via ORAL
  Filled 2024-01-26 (×12): qty 2

## 2024-01-26 MED ORDER — LACTATED RINGERS IV SOLN
150.0000 mL/h | INTRAVENOUS | Status: DC
Start: 2024-01-26 — End: 2024-01-26

## 2024-01-26 MED ORDER — NOREPINEPHRINE 4 MG/250ML-% IV SOLN
0.0000 ug/min | INTRAVENOUS | Status: DC
  Filled 2024-01-26: qty 250

## 2024-01-26 NOTE — Progress Notes (Addendum)
 Pharmacy Antibiotic Note  Christopher [name] is a 73 y.o. male admitted on 01/26/2024 with sepsis.  Pharmacy has been consulted for Zosyn , Vancomycin  dosing.  Plan: Zosyn  3.375g IV q8h (4 hour infusion).  Vancomycin  2500 mg IV X 1 loading dose given on 6/5 @ 0300.   Vancomycin  1500 mg IV Q24H ordered to start on 6/6 @ 0300.   AUC = 491.7 Vanc trough = 12.  Weight: (!) 146.1 kg (322 lb)  Temp (24hrs), Avg:100.5 F (38.1 C), Min:99.8 F (37.7 C), Max:101.9 F (38.8 C)  Recent Labs  Lab 01/26/24 0040 01/26/24 0300  WBC 18.0*  --   CREATININE 1.46*  --   LATICACIDVEN 2.0* 3.0*    CrCl cannot be calculated (Unknown ideal weight.).    Allergies  Allergen Reactions   Propranolol  Other (See Comments)    Hallucinations    Albuterol Other (See Comments)    Causes afib    Gramineae Pollens Other (See Comments)    Sneeze and watery eyes Sneeze and watery eyes   Other Other (See Comments)    Other reaction(s): Other (See Comments) Unable to breath when around cats Other reaction(s): Other (See Comments) Unable to breath when around cats    Antimicrobials this admission:   >>    >>   Dose adjustments this admission:   Microbiology results:  BCx:   UCx:    Sputum:    MRSA PCR:   Thank you for allowing pharmacy to be a part of this patient's care.  Canda Podgorski D 01/26/2024 6:22 AM

## 2024-01-26 NOTE — Plan of Care (Signed)

## 2024-01-26 NOTE — H&P (Signed)
 [facility] History and Physical  Christopher [name] NWG:[id number] DOB: 04-03-1951 DOA: 01/26/2024 PCP: Sari Cunning, MD  Presented from: Home Chief Complaint: Not feeling well  History of Present Illness: Christopher [name] is a 73 y.o. male with PMH significant for morbid obesity, OSA on CPAP, HTN, HLD, CHF, COPD on 2 L O2. Patient was brought to the ED by EMS for ' not feeling well'.  Patient lives at home, his daughter lives with him.  Per daughter, he spends most of his day on recliner and gets up only to go to the bathroom.  He is able to stand up and go to the bathroom with a walker.  But he has not been house for about 3 weeks.  He has been refusing to go to outpatient PT.   In the past few years, patient has had left ankle surgery, right femur surgery.  He was in a rehab after both procedures.  While at the rehab, his mobility were improved but after returning home, he is not motivated to ambulate.  His wife died in 02/12/2021 after which he has progressively declined..  Patient gets sore at the bottom, sometimes picks at it.  Uses some kind of cream.  Never had it infected or required surgery.  In the ED, patient had a temperature of 101.9, heart rate of 113, blood pressure in 90s [place] labs with WC count 18, lactic acid 2, BUN/creatinine 30/1.46 Urinalysis clear yellow with small leukocytes, negative nitrite no bacteria Blood culture was sent CT chest abdomen pelvis was obtained which showed skin thickening in the intergluteal cleft, L>R suggestive of cellulitis, without any fluid collection or deep inflammation. Patient was started on broad-spectrum IV antibiotics with IV Zosyn  and vancomycin  Hospitalist service was consulted for inpatient management While in the ED, patient's blood pressure dropped down to 80s.  He was given fluid boluses.   PCCM was consulted.  Recommended further resuscitation and monitoring.  Did not meet ICU criteria.  I received this patient this morning for  admission as a carryover from last night. At the time of my evaluation, patient was alert, awake, oriented x 3, his daughter was at bedside who helped with most of the history.  History reviewed and detailed as above.   With the help of nursing staff, I stood up the patient and took pictures of his bottom.   Review of Systems:  All systems were reviewed and were negative unless otherwise mentioned in the HPI   Past medical history: Past Medical History:  Diagnosis Date   Anxiety    CHF (congestive heart failure) (HCC)    COPD (chronic obstructive pulmonary disease) (HCC)    Depression    Dysrhythmia    Hyperlipidemia    Hypertension    Hypothyroidism    Sleep apnea     Past surgical history: Past Surgical History:  Procedure Laterality Date   FRACTURE SURGERY     ORIF ANKLE FRACTURE Left 12/17/2020   Procedure: OPEN REDUCTION INTERNAL FIXATION (ORIF) ANKLE FRACTURE;  Surgeon: Anell Baptist, DPM;  Location: [facility] ORS;  Service: Podiatry;  Laterality: Left;   ORIF FEMUR FRACTURE Left 01/20/2023   Procedure: OPEN REDUCTION INTERNAL FIXATION (ORIF) DISTAL FEMUR FRACTURE;  Surgeon: Laneta Pintos, MD;  Location: [facility] OR;  Service: Orthopedics;  Laterality: Left;    Social History:  reports that he has never smoked. He has never used smokeless tobacco. He reports that he does not currently use alcohol. He reports that he does not use drugs.  Allergies:  Allergies  Allergen Reactions   Propranolol  Other (See Comments)    Hallucinations    Albuterol Other (See Comments)    Causes afib    Gramineae Pollens Other (See Comments)    Sneeze and watery eyes Sneeze and watery eyes   Other Other (See Comments)    Other reaction(s): Other (See Comments) Unable to breath when around cats Other reaction(s): Other (See Comments) Unable to breath when around cats   Propranolol , Albuterol, Gramineae pollens, and Other   Family history:  Family History  Problem Relation Age of Onset    Hypertension Mother      Physical Exam: Vitals:   01/26/24 0850 01/26/24 0900 01/26/24 0917 01/26/24 0920  BP: (!) 93/48 (!) 95/47 122/62 (!) 112/56  Pulse: 77 77 90 91  Resp: (!) 21 (!) 21 19 (!) 21  Temp:      TempSrc:      SpO2: 100% 100% 94% 97%  Weight:      Height:       Wt Readings from Last 3 Encounters:  01/26/24 (!) 146.1 kg  06/10/23 (!) 142 kg  01/19/23 (!) 151.3 kg   Body mass index is 43.67 kg/m.  General exam: Pleasant, elderly morbidly obese male on low-flow oxygen.  Not in pain at rest Skin: No rashes, lesions or ulcers. HEENT: Atraumatic, normocephalic, no obvious bleeding Lungs: Clear to auscultation bilaterally, diminished air entry in both bases CVS: S1, S2, no murmur,   GI/Abd: Soft, nontender, distended from obesity, bowel sound present,  Back: Examined to look for decubitus ulcers.  Only superficial cellulitis noted.  Pictures attached CNS: Alert, awake, oriented x 3 Psychiatry: Mood appropriate,  Extremities: Chronic puffy legs with chronic cellulitis.  Not worse than baseline per family.  No calf tenderness.           ----------------------------------------------------------------------------------------------------------------------------------------- ----------------------------------------------------------------------------------------------------------------------------------------- -----------------------------------------------------------------------------------------------------------------------------------------  Assessment/Plan: Principal Problem:   Severe sepsis (HCC)  Severe sepsis POA Cellulitis of sacral area Morbidly obese male, spends his day mostly in recliner.. Presented with fever, leukocytosis, lactic acidosis CT scan suggestive of sacral area cellulitis without open ulcers or fluid collection or deep infection. Blood culture sent Started on broad-spectrum IV antibiotics.  Continue the same. Pictures  attached Continue to monitor temperature and WBC trend Recent Labs  Lab 01/26/24 0040 01/26/24 0300 01/26/24 0652  WBC 18.0*  --   --   LATICACIDVEN 2.0* 3.0* 1.2   Hypotension Overnight, blood pressure has been running low in the 80s.  On exam patient does not look toxic, alert, awake.  He has bounding pulse.  I believe because of his weight, the measurement is not reflecting true blood pressure.  It seems he is also on midodrine  at home so he may have chronic hypotension. Given 500 mL of normal send bolus earlier.  I will continue normal saline at 75 mL/h Resume midodrine .  Was also on torsemide  as needed which I will hold for now  HLD Aspirin  and statin  COPD on 2 L oxygen Continue supplemental oxygen and bronchodilators Not sure if he is on daily prednisone.  Med list currently shows 10 mg daily.  Daughter unable to confirm.  Please double check  Impaired mobility Will definitely benefit from PT.  Hypothyroidism Continue Synthroid   Morbid Obesity  Body mass index is 43.67 kg/m. Patient has been advised to make an attempt to improve diet and exercise patterns to aid in weight loss.  OSA On nocturnal CPAP  Goals of care:   Code Status:  Full Code    DVT prophylaxis:  enoxaparin  (LOVENOX ) injection 40 mg Start: 01/26/24 1015   Antimicrobials: IV Zosyn , IV vancomycin  Fluid: NS at 75 mL/h Consultants: None Family Communication: Daughter at bedside  Status: Inpatient Level of care:  Progressive   Patient is from: Home Anticipated d/c to: Pending clinical course.  Pending PT eval  Diet:  Diet Order             Diet Heart Room service appropriate? Yes; Fluid consistency: Thin  Diet effective now                    ------------------------------------------------------------------------------------- Severity of Illness: The appropriate patient status for this patient is INPATIENT. Inpatient status is judged to be reasonable and necessary in order to  provide the required intensity of service to ensure the patient's safety. The patient's presenting symptoms, physical exam findings, and initial radiographic and laboratory data in the context of their chronic comorbidities is felt to place them at high risk for further clinical deterioration. Furthermore, it is not anticipated that the patient will be medically stable for discharge from the hospital within 2 midnights of admission.   * I certify that at the point of admission it is my clinical judgment that the patient will require inpatient hospital care spanning beyond 2 midnights from the point of admission due to high intensity of service, high risk for further deterioration and high frequency of surveillance required.* -------------------------------------------------------------------------------------   Home Meds: Prior to Admission medications   Medication Sig Start Date End Date Taking? Authorizing Provider  acetaminophen  (TYLENOL ) 325 MG tablet Take 2 tablets (650 mg total) by mouth every 6 (six) hours as needed for mild pain, fever, headache or moderate pain. 05/23/21  Yes Wieting, Richard, MD  aspirin  81 MG EC tablet Take 81 mg by mouth at bedtime.   Yes [provider]  atorvastatin  (LIPITOR) 20 MG tablet Take 20 mg by mouth at bedtime. 10/06/20  Yes [provider]  buPROPion  (WELLBUTRIN  SR) 150 MG 12 hr tablet Take 150 mg by mouth 2 (two) times daily. After 1st meal of day and after dinner 12/02/20  Yes [provider]  Cholecalciferol  25 MCG (1000 UT) capsule Take 1,000 Units by mouth at bedtime.   Yes [provider]  cyanocobalamin  1000 MCG tablet Take 1,000 mcg by mouth 2 (two) times a week. At bedtime. Sundays and Wednesdays   Yes [provider]  FLOVENT  HFA 110 MCG/ACT inhaler Inhale 1 puff into the lungs 2 (two) times daily. 07/29/21  Yes [provider]  HYDROcodone -acetaminophen  (NORCO/VICODIN) 5-325 MG tablet Take 1-2  tablets by mouth every 6 (six) hours as needed for moderate pain (pain score 4-6) or severe pain (pain score 7-10). 06/12/23  Yes Alexander, Natalie, DO  ipratropium (ATROVENT ) 0.06 % nasal spray Place 2 sprays into both nostrils 3 (three) times daily as needed for rhinitis. 10/07/21  Yes [provider]  levothyroxine  (SYNTHROID ) 88 MCG tablet Take 88 mcg by mouth daily. 30 to 60 minutes before breakfast on an empty stomach and with a glass of water 12/02/20  Yes [provider]  loratadine  (CLARITIN ) 10 MG tablet Take 10 mg by mouth daily.   Yes [provider]  Magnesium  250 MG TABS Take 1 tablet by mouth daily.   Yes [provider]  melatonin 3 MG TABS tablet Take 3 mg by mouth at bedtime.   Yes [provider]  methocarbamol  (ROBAXIN ) 500 MG tablet Take 1  tablet (500 mg total) by mouth every 6 (six) hours as needed for muscle spasms. 01/24/23  Yes Versie Gores, PA-C  midodrine  (PROAMATINE ) 10 MG tablet Take 1 tablet (10 mg total) by mouth 3 (three) times daily. 06/12/23  Yes Alexander, Natalie, DO  Multiple Vitamins-Minerals (PRESERVISION AREDS 2) CAPS Take 1 capsule by mouth 2 (two) times daily. With food. After 1st meal of day and after dinner   Yes [provider]  potassium chloride  SA (KLOR-CON  M) 20 MEQ tablet Take 1 tablet (20 mEq total) by mouth daily. When taking torsemide  06/12/23  Yes Alexander, Natalie, DO  predniSONE (DELTASONE) 10 MG tablet Take 10 mg by mouth See admin instructions. 11/23/23  Yes [provider]  sertraline  (ZOLOFT ) 50 MG tablet Take 50 mg by mouth daily.   Yes [provider]  torsemide  (DEMADEX ) 20 MG tablet Take 1 tablet (20 mg total) by mouth daily as needed (severe edema). 06/12/23  Yes Alexander, Natalie, DO  trolamine salicylate (ASPERCREME) 10 % cream Apply 1 application topically as needed for muscle pain. Apply to knees and feet   Yes [provider]  Vitamins A & D (VITAMIN A  & D) ointment Apply 1 application topically as needed. To groin area.   Yes [provider]  alum & mag hydroxide-simeth (MAALOX/MYLANTA) 200-200-20 MG/5ML suspension Take 15 mLs by mouth every 6 (six) hours as needed for indigestion or heartburn. Patient not taking: Reported on 01/26/2024 01/25/23   Aura Leeds Latif, DO  apixaban  (ELIQUIS ) 5 MG TABS tablet Take 1 tablet (5 mg total) by mouth 2 (two) times daily. Patient not taking: Reported on 01/26/2024 06/12/23   Melodi Sprung, DO  bisacodyl  (DULCOLAX) 10 MG suppository Place 1 suppository (10 mg total) rectally daily as needed for moderate constipation. Patient not taking: Reported on 01/26/2024 01/25/23   Aura Leeds Latif, DO  levalbuterol  (XOPENEX  HFA) 45 MCG/ACT inhaler Inhale 2 puffs into the lungs every 4 (four) hours as needed for wheezing. Patient not taking: Reported on 01/26/2024    [provider]  ondansetron  (ZOFRAN ) 4 MG tablet Take 1 tablet (4 mg total) by mouth every 6 (six) hours as needed for nausea. Patient not taking: Reported on 01/26/2024 01/25/23   Sheikh, Omair Latif, DO  polyethylene glycol (MIRALAX  / GLYCOLAX ) 17 g packet Take 17 g by mouth 2 (two) times daily. Patient not taking: Reported on 01/26/2024 01/25/23   Aura Leeds Latif, DO  senna-docusate (SENOKOT-S) 8.6-50 MG tablet Take 1 tablet by mouth 2 (two) times daily. Patient not taking: Reported on 01/26/2024 01/25/23   Eveline Hipps, DO    Labs on Admission:   CBC: Recent Labs  Lab 01/26/24 0040  WBC 18.0*  NEUTROABS 16.7*  HGB 13.4  HCT 43.1  MCV 85.9  PLT 239    Basic Metabolic Panel: Recent Labs  Lab 01/26/24 0040  NA 138  K 4.6  CL 104  CO2 25  GLUCOSE 145*  BUN 30*  CREATININE 1.46*  CALCIUM  8.6*    Liver Function Tests: Recent Labs  Lab 01/26/24 0040  AST 19  ALT 9  ALKPHOS 49  BILITOT 0.8  PROT 6.4*  ALBUMIN  3.5   No results for input(s): "LIPASE", "AMYLASE" in the last 168 hours. No results for input(s):  "AMMONIA" in the last 168 hours.  Cardiac Enzymes: No results for input(s): "CKTOTAL", "CKMB", "CKMBINDEX", "TROPONINI" in the last 168 hours.  BNP (last 3 results) No results for input(s): "BNP" in the last  8760 hours.  ProBNP (last 3 results) No results for input(s): "PROBNP" in the last 8760 hours.  CBG: No results for input(s): "GLUCAP" in the last 168 hours.  Lipase  No results found for: "LIPASE"   Urinalysis    Component Value Date/Time   COLORURINE YELLOW (A) 01/26/2024 0143   APPEARANCEUR CLEAR (A) 01/26/2024 0143   LABSPEC 1.020 01/26/2024 0143   PHURINE 6.0 01/26/2024 0143   GLUCOSEU NEGATIVE 01/26/2024 0143   HGBUR NEGATIVE 01/26/2024 0143   BILIRUBINUR NEGATIVE 01/26/2024 0143   KETONESUR NEGATIVE 01/26/2024 0143   PROTEINUR NEGATIVE 01/26/2024 0143   NITRITE NEGATIVE 01/26/2024 0143   LEUKOCYTESUR SMALL (A) 01/26/2024 0143     Drugs of Abuse  No results found for: "LABOPIA", "COCAINSCRNUR", "LABBENZ", "AMPHETMU", "THCU", "LABBARB"    Radiological Exams on Admission: CT CHEST ABDOMEN PELVIS W CONTRAST Result Date: 01/26/2024 CLINICAL DATA:  Sepsis EXAM: CT CHEST, ABDOMEN, AND PELVIS WITH CONTRAST TECHNIQUE: Multidetector CT imaging of the chest, abdomen and pelvis was performed following the standard protocol during bolus administration of intravenous contrast. RADIATION DOSE REDUCTION: This exam was performed according to the departmental dose-optimization program which includes automated exposure control, adjustment of the mA and/or kV according to patient size and/or use of iterative reconstruction technique. CONTRAST:  OMNIPAQUE  IOHEXOL  350 MG/ML SOLN COMPARISON:  05/20/2021 FINDINGS: CT CHEST FINDINGS Cardiovascular: No significant coronary artery calcification. Global cardiac size within normal limits. No pericardial effusion. Central pulmonary arteries are enlarged in keeping with changes of pulmonary arterial hypertension. No significant atherosclerotic  calcification within the thoracic aorta. No aortic aneurysm. Mediastinum/Nodes: No enlarged mediastinal, hilar, or axillary lymph nodes. Thyroid gland, trachea, and esophagus demonstrate no significant findings. Lungs/Pleura: Lungs are clear. No pleural effusion or pneumothorax. Musculoskeletal: No chest wall mass or suspicious bone lesions identified. CT ABDOMEN PELVIS FINDINGS Hepatobiliary: Cholelithiasis without superimposed pericholecystic inflammatory change. Liver unremarkable; no enhancing intrahepatic mass identified. No intra or extrahepatic biliary ductal dilation. Pancreas: Unremarkable Spleen: Unremarkable Adrenals/Urinary Tract: The adrenal glands are unremarkable. The kidneys are normal in size and position. Mild bilateral nonobstructing nephrolithiasis is present multiple calculi measuring up to 6 mm within the mid and lower pole of the kidneys bilaterally. The kidneys are otherwise unremarkable. Bladder unremarkable. Stomach/Bowel: Stomach is within normal limits. Appendix appears normal. No evidence of bowel wall thickening, distention, or inflammatory changes. Vascular/Lymphatic: No significant vascular findings are present. No enlarged abdominal or pelvic lymph nodes. Reproductive: Prostate is unremarkable. Other: Numerous nodular and varicoid subcutaneous soft tissue nodules are identified within the subcutaneous gluteal tissues adjacent to the inter gluteal cleft of unclear significance the these may represent varicosities, subcutaneous collections such as sebaceous or epidermoid cysts, the sequela of subcutaneous injection of foreign body material, or venous varicosities. There is thickening of the dermal tissues along the intergluteal cleft, left greater than right, in keeping with superficial inflammatory processes could be seen with cellulitis. No loculated subcutaneous fluid collections are identified the inflammatory process remains external to the deep pelvis and perirectal soft tissues.  Musculoskeletal: No acute bone abnormality. No lytic or blastic bone lesion. IMPRESSION: 1. Thickening of the dermal tissues along the intergluteal cleft, left greater than right, in keeping with superficial inflammatory processes could be seen with cellulitis. No loculated subcutaneous fluid collections are identified. The inflammatory process remains external to the deep pelvis and perirectal soft tissues. 2. Numerous nodular and varicoid subcutaneous soft tissue nodules within the gluteal tissues adjacent to the intergluteal cleft of unclear significance. These may represent varicosities, subcutaneous  collections such as sebaceous or epidermoid cysts, the sequela of subcutaneous injection of foreign body material, or venous varicosities. Correlation clinical examination is recommended. 3. Cholelithiasis. 4. Mild bilateral nonobstructing nephrolithiasis. 5. Enlarged central pulmonary arteries in keeping with changes of pulmonary arterial hypertension. Electronically Signed   By: Worthy Heads M.D.   On: 01/26/2024 02:45   DG Chest Port 1 View Result Date: 01/26/2024 CLINICAL DATA:  Question of sepsis to evaluate for abnormality. Fever and fatigue. EXAM: PORTABLE CHEST 1 VIEW COMPARISON:  06/10/2023 FINDINGS: Shallow inspiration. Mild cardiac enlargement. No vascular congestion, edema, or consolidation. No pleural effusion or pneumothorax. Mediastinal contours appear intact. Degenerative changes in the spine. IMPRESSION: Cardiac enlargement.  No evidence of active pulmonary disease. Electronically Signed   By: Boyce Byes M.D.   On: 01/26/2024 00:20     Signed, Hoyt Macleod, MD [facility] 01/26/2024

## 2024-01-26 NOTE — ED Provider Notes (Addendum)
 [facility] Provider Note    Event Date/Time   First MD Initiated Contact with Patient 01/26/24 0001     (approximate)   History   Fatigue   HPI  Christopher [name] is a 73 y.o. male   Past medical history of atrial fibrillation previously on Eliquis  but no longer, mostly bedbound, COPD on home oxygen, hyperlipidemia and hypertension, hypothyroid presents with code sepsis from EMS.  He has been complaining of fatigue.    He has no localizing complaints.  Specifically on review of systems he denies chest pain, shortness of breath, increasing cough or sputum production, abdominal pain, dysuria or frequency, diarrhea, or any known skin rashes.  He does note chronic stable cough with his COPD.  He had a fever per EMS and was given a full gram of Tylenol .  Initially was unable to get blood pressure but after 500 cc of normal saline got normotension.  He was tachycardic.  He has been compliant with his medications (family helps him with his medications, and notes that Eliquis  was discontinued by Dr. Recently).  He denies any trauma.  Independent Historian contributed to assessment above: EMS gives report as above  External Medical Documents Reviewed: Discharge summary from hospitalization for sepsis last year thought to be due to a cellulitis of his back.      Physical Exam   Triage Vital Signs: ED Triage Vitals  Encounter Vitals Group     BP 01/26/24 0005 112/64     Systolic BP Percentile --      Diastolic BP Percentile --      Pulse Rate 01/26/24 0005 (!) 113     Resp 01/26/24 0005 18     Temp 01/26/24 0005 (!) 101.9 F (38.8 C)     Temp Source 01/26/24 0005 Oral     SpO2 01/26/24 0005 96 %     Weight --      Height --      Head Circumference --      Peak Flow --      Pain Score 01/26/24 0006 0     Pain Loc --      Pain Education --      Exclude from Growth Chart --     Most recent vital signs: Vitals:   01/26/24 0138 01/26/24 0200  BP:   (!) 96/57  Pulse:  99  Resp:    Temp: 99.8 F (37.7 C)   SpO2:  96%    General: Awake, no distress.  CV:  Good peripheral perfusion. Resp:  Normal effort.  Abd:  No distention.  Other:  Febrile and tachycardic.  Normotension.  On nasal cannula oxygen.  Saturations are normal.  No respiratory distress with clear lungs no focality or wheezing.  Soft benign abdominal exam.  Skin examination does show dry cracked skin to the lower buttocks area and some cellulitic changes redness moist irritated appearing area to the perineum but with no crepitus or pain out of proportion.  No obvious abscess but difficult to assess due to his body habitus.   ED Results / Procedures / Treatments   Labs (all labs ordered are listed, but only abnormal results are displayed) Labs Reviewed  LACTIC ACID, PLASMA - Abnormal; Notable for the following components:      Result Value   Lactic Acid, Venous 2.0 (*)    All other components within normal limits  COMPREHENSIVE METABOLIC PANEL WITH GFR - Abnormal; Notable for the following components:   Glucose,  Bld 145 (*)    BUN 30 (*)    Creatinine, Ser 1.46 (*)    Calcium  8.6 (*)    Total Protein 6.4 (*)    GFR, Estimated 50 (*)    All other components within normal limits  CBC WITH DIFFERENTIAL/PLATELET - Abnormal; Notable for the following components:   WBC 18.0 (*)    Neutro Abs 16.7 (*)    Lymphs Abs 0.3 (*)    Abs Immature Granulocytes 0.10 (*)    All other components within normal limits  URINALYSIS, W/ REFLEX TO CULTURE (INFECTION SUSPECTED) - Abnormal; Notable for the following components:   Color, Urine YELLOW (*)    APPearance CLEAR (*)    Leukocytes,Ua SMALL (*)    All other components within normal limits  CULTURE, BLOOD (ROUTINE X 2)  CULTURE, BLOOD (ROUTINE X 2)  PROTIME-INR  TSH  T4, FREE  LACTIC ACID, PLASMA  TROPONIN I (HIGH SENSITIVITY)  TROPONIN I (HIGH SENSITIVITY)      EKG  ED ECG REPORT I, Buell Carmin, the attending  physician, personally viewed and interpreted this ECG.   Date: 01/26/2024  EKG Time: 0046  Rate: 111  Rhythm: sinus tachycardia  Axis: nl  Intervals:nl  ST&T Change: no stemi    RADIOLOGY I independently reviewed and interpreted chest x-ray and I see no obvious focality or pneumothorax I also reviewed radiologist's formal read.   PROCEDURES:  Critical Care performed: Yes, see critical care procedure note(s)  .Critical Care  Performed by: Buell Carmin, MD Authorized by: Buell Carmin, MD   Critical care provider statement:    Critical care time (minutes):  50   Critical care was time spent personally by me on the following activities:  Development of treatment plan with patient or surrogate, discussions with consultants, evaluation of patient's response to treatment, examination of patient, ordering and review of laboratory studies, ordering and review of radiographic studies, ordering and performing treatments and interventions, pulse oximetry, re-evaluation of patient's condition and review of old charts    MEDICATIONS ORDERED IN ED: Medications  vancomycin  (VANCOCIN ) IVPB 1000 mg/200 mL premix (has no administration in time range)    Followed by  vancomycin  (VANCOREADY) IVPB 1500 mg/300 mL (has no administration in time range)  sodium chloride  0.9 % bolus 2,000 mL (2,000 mLs Intravenous New Bag/Given 01/26/24 0047)  piperacillin -tazobactam (ZOSYN ) IVPB 3.375 g (0 g Intravenous Stopped 01/26/24 0136)  iohexol  (OMNIPAQUE ) 350 MG/ML injection 100 mL (100 mLs Intravenous Contrast Given 01/26/24 0222)    IMPRESSION / MDM / ASSESSMENT AND PLAN / ED COURSE  I reviewed the triage vital signs and the nursing notes.                                Patient's presentation is most consistent with acute presentation with potential threat to life or bodily function.  Differential diagnosis includes, but is not limited to, sepsis due to skin infection, urinary tract infection, abscess,  considered necrotizing fasciitis, respiratory infection   The patient is on the cardiac monitor to evaluate for evidence of arrhythmia and/or significant heart rate changes.  MDM:    Concern for sepsis given his fever and tachycardia.  Started with broad-spectrum antibiotics, sepsis labs, and 30 cc/kg ideal body weight fluid bolus.  He already got Tylenol  by EMS just prior to arrival as antipyretic.  Sources most likely with the skin changes to his buttock/perineal area is cellulitis.  No  obvious abscess but given body habitus is difficult to ascertain.  Considered necrotizing fasciitis but without pain out of proportion or crepitus I think less likely.  A CT scan will help distinguish between any deeper tracking abscess or cellulitis so I have ordered.  He does have COPD and a chronic cough that he reports is not worsened but he does have a intermittent cough in the room will evaluate him, I will let the CT scan come through his chest as well to assess for that as a potential source.  He will need admission after initial evaluation as above.   -- Sepsis reevaluation; Lactic at 2.0. Patient stable, currently getting IV fluids and blood pressure remains with MAP above 65, noted by family with chronic low blood pressures and discharge summary from October visit had discharge vital signs of 116/62.  Mentation continues to be normal.  IV antibiotics have been initiated.  Pending results of CT scan, admission.  --  I checked on him again.  His blood pressure is starting to drift lower.  Maps below 65 and a couple of systolic pressures in the 80s/90s still mentating well.  His lactic has increased now from 2-3.   CT scan shows cellulitic changes but nothing deeper.  I spoke with ICU consulted and as we were speaking his blood pressure is now 67 MAP, without further intervention.  He is just flirting with a MAP of 65 and given his baseline low blood pressure, we both decided it would be reasonable to  try some extra fluid and start on 150/h of maintenance fluids and see where he pans out for blood pressure to see if disposition requires ICU on pressors versus hospital floor.        FINAL CLINICAL IMPRESSION(S) / ED DIAGNOSES   Final diagnoses:  Sepsis, due to unspecified organism, unspecified whether acute organ dysfunction present (HCC)  Cellulitis of buttock     Rx / DC Orders   ED Discharge Orders     None        Note:  This document was prepared using Dragon voice recognition software and may include unintentional dictation errors.    Buell Carmin, MD 01/26/24 Katrina Parma    Buell Carmin, MD 01/26/24 4782    Buell Carmin, MD 01/26/24 Franciso Irwin    Buell Carmin, MD 01/26/24 [phone]

## 2024-01-26 NOTE — Progress Notes (Signed)
 Asked by EDP to evaluate patient for ICU admission. Patient presented with main complaint of fatigue. Upon arrival to ED patient met sepsis criteria and protocol initiated. Hemodynamics remained borderline with increasing lactic, PCCM consulted.   Upon bedside assessment, patient is A&O- non-toxic appearing- without dyspnea on room air. Vitals are stable, with most recent BP 102/54 not on vasopressor support.   Recommend additional resuscitation, f/u lactic. As hemodynamics are stable, patient does not meet ICU admission criteria at this time. Please re-consult if needed in the future.       Recardo Canal Rust-Chester, AGACNP-BC Acute Care Nurse Practitioner [facility]    [phone] / [phone] Please see Amion for pager details.

## 2024-01-26 NOTE — Progress Notes (Signed)
 PHARMACIST - PHYSICIAN COMMUNICATION  CONCERNING:  Enoxaparin  (Lovenox ) for DVT Prophylaxis    RECOMMENDATION: Patient was prescribed enoxaprin 40mg  q24 hours for VTE prophylaxis.   Filed Weights   01/26/24 0137  Weight: (!) 146.1 kg (322 lb)    Body mass index is 43.67 kg/m.  Estimated Creatinine Clearance: 66.9 mL/min (A) (by C-G formula based on SCr of 1.46 mg/dL (H)).   Based on [facility] policy patient is candidate for enoxaparin  0.5mg /kg TBW SQ every 24 hours based on BMI being >30.   DESCRIPTION: Pharmacy has adjusted enoxaparin  dose per [facility] policy.  Patient is now receiving enoxaparin  70 mg every 24 hours    Ramonita Burow, PharmD Clinical Pharmacist  01/26/2024 10:15 AM

## 2024-01-26 NOTE — ED Triage Notes (Signed)
 BIB EMS from home for C/O "not feeling well." EMS reports a temp. Of 102.50F. EMS gave 500mL of NS and 1000mg  of Tylenol .

## 2024-01-26 NOTE — ED Notes (Signed)
 Patient to CT at this time

## 2024-01-27 DIAGNOSIS — R6520 Severe sepsis without septic shock: Secondary | ICD-10-CM | POA: Diagnosis not present

## 2024-01-27 DIAGNOSIS — A419 Sepsis, unspecified organism: Secondary | ICD-10-CM | POA: Diagnosis not present

## 2024-01-27 LAB — CBC WITH DIFFERENTIAL/PLATELET
Abs Immature Granulocytes: 0.08 10*3/uL — ABNORMAL HIGH (ref 0.00–0.07)
Basophils Absolute: 0.1 10*3/uL (ref 0.0–0.1)
Basophils Relative: 1 %
Eosinophils Absolute: 0.1 10*3/uL (ref 0.0–0.5)
Eosinophils Relative: 1 %
HCT: 37.9 % — ABNORMAL LOW (ref 39.0–52.0)
Hemoglobin: 11.5 g/dL — ABNORMAL LOW (ref 13.0–17.0)
Immature Granulocytes: 1 %
Lymphocytes Relative: 4 %
Lymphs Abs: 0.4 10*3/uL — ABNORMAL LOW (ref 0.7–4.0)
MCH: 26.5 pg (ref 26.0–34.0)
MCHC: 30.3 g/dL (ref 30.0–36.0)
MCV: 87.3 fL (ref 80.0–100.0)
Monocytes Absolute: 0.6 10*3/uL (ref 0.1–1.0)
Monocytes Relative: 6 %
Neutro Abs: 8.9 10*3/uL — ABNORMAL HIGH (ref 1.7–7.7)
Neutrophils Relative %: 87 %
Platelets: 174 10*3/uL (ref 150–400)
RBC: 4.34 MIL/uL (ref 4.22–5.81)
RDW: 15.6 % — ABNORMAL HIGH (ref 11.5–15.5)
WBC: 10.1 10*3/uL (ref 4.0–10.5)
nRBC: 0 % (ref 0.0–0.2)

## 2024-01-27 LAB — CREATININE, SERUM
Creatinine, Ser: 1.33 mg/dL — ABNORMAL HIGH (ref 0.61–1.24)
GFR, Estimated: 56 mL/min — ABNORMAL LOW (ref >60)

## 2024-01-27 LAB — BASIC METABOLIC PANEL WITH GFR
Anion gap: 6 (ref 5–15)
BUN: 22 mg/dL (ref 8–23)
CO2: 24 mmol/L (ref 22–32)
Calcium: 8.1 mg/dL — ABNORMAL LOW (ref 8.9–10.3)
Chloride: 108 mmol/L (ref 98–111)
Creatinine, Ser: 1.37 mg/dL — ABNORMAL HIGH (ref 0.61–1.24)
GFR, Estimated: 54 mL/min — ABNORMAL LOW (ref >60)
Glucose, Bld: 114 mg/dL — ABNORMAL HIGH (ref 70–99)
Potassium: 4.2 mmol/L (ref 3.5–5.1)
Sodium: 138 mmol/L (ref 135–145)

## 2024-01-27 LAB — CORTISOL-AM, BLOOD: Cortisol - AM: 16.3 ug/dL (ref 6.7–22.6)

## 2024-01-27 LAB — PROTIME-INR
INR: 1.3 — ABNORMAL HIGH (ref 0.8–1.2)
Prothrombin Time: 16.1 s — ABNORMAL HIGH (ref 11.4–15.2)

## 2024-01-27 MED ORDER — SODIUM CHLORIDE 0.9 % IV SOLN: 2.0000 g | Freq: Once | INTRAVENOUS | Status: DC

## 2024-01-27 MED ORDER — SODIUM CHLORIDE 0.9 % IV SOLN
2.0000 g | Freq: Three times a day (TID) | INTRAVENOUS | Status: DC
  Administered 2024-01-27 – 2024-01-29 (×7): 2 g via INTRAVENOUS
  Filled 2024-01-27 (×7): qty 12.5

## 2024-01-27 MED ORDER — VANCOMYCIN HCL 1750 MG/350ML IV SOLN
1750.0000 mg | INTRAVENOUS | Status: DC
  Administered 2024-01-28 – 2024-01-29 (×2): 1750 mg via INTRAVENOUS
  Filled 2024-01-27 (×2): qty 350

## 2024-01-27 MED ORDER — SERTRALINE HCL 50 MG PO TABS
50.0000 mg | ORAL_TABLET | Freq: Every day | ORAL | Status: DC
  Administered 2024-01-27 – 2024-01-30 (×4): 50 mg via ORAL
  Filled 2024-01-27 (×4): qty 1

## 2024-01-27 NOTE — TOC Progression Note (Signed)
 Transition of Care [facility]) - Progression Note    Patient Details  Name: Christopher [name] MRN: [id number] Date of Birth: May 16, 1951  Transition of Care [facility]) CM/SW Contact  Baird Bombard, RN Phone Number: 01/27/2024, 11:36 AM  Clinical Narrative:    Spoke with patient's daughter, Christopher [name] regarding [facility] therapy recommendations. Christopher [name] is agreeable and does not have a preference of an agency. She is requesting to be point of contact. She was advised the accepting [facility] agency will contact her within 48 hours of discharge.   Referral sent to and accepted by Shaun from [facility] .         Expected Discharge Plan and Services                                               Social Determinants of Health (SDOH) Interventions SDOH Screenings   Food Insecurity: No Food Insecurity (01/26/2024)  Housing: Low Risk  (01/26/2024)  Transportation Needs: No Transportation Needs (01/26/2024)  Utilities: Not At Risk (01/26/2024)  Depression (PHQ2-9): Low Risk  (10/20/2022)  Social Connections: Unknown (01/26/2024)  Tobacco Use: Low Risk  (01/26/2024)    Readmission Risk Interventions     No data to display

## 2024-01-27 NOTE — Evaluation (Signed)
 Occupational Therapy Evaluation Patient Details Name: Christopher [name] MRN: [id number] DOB: 26-Dec-1950 Today's Date: 01/27/2024   History of Present Illness   Pt admitted for severe sepsis with complaints of not feeling well. History of obesity, HTN, CHF, and COPD. 2L of O2 PRN at home. Was previously getting OP PT.     Clinical Impressions Patient presenting with decreased Ind in self care,balance, functional mobility/transfers, endurance, and safety awareness. Patient reports living at home with daughter. She is home 24/7 and assists him with IADLs, getting in /out of shower, and donning LB clothing. He ambulates short distance into bathroom but mostly pushes himself around in wheelchair at home. Patient currently functioning at mod A to stand from recliner chair with RW x 2 reps. Pt taking several steps forwards and then backwards. Pt fatigues quickly and returns to recliner chair.  Patient will benefit from acute OT to increase overall independence in the areas of ADLs, functional mobility, and safety awareness in order to safely discharge.     If plan is discharge home, recommend the following:   A little help with walking and/or transfers;A lot of help with bathing/dressing/bathroom;Assistance with cooking/housework;Assist for transportation;Help with stairs or ramp for entrance     Functional Status Assessment   Patient has had a recent decline in their functional status and demonstrates the ability to make significant improvements in function in a reasonable and predictable amount of time.     Equipment Recommendations   None recommended by OT      Precautions/Restrictions   Precautions Precautions: Fall Recall of Precautions/Restrictions: Intact     Mobility Bed Mobility               General bed mobility comments: seated in recliner chair    Transfers Overall transfer level: Needs assistance Equipment used: Rolling walker (2 wheels) Transfers: Sit  to/from Stand Sit to Stand: Mod assist           General transfer comment: mod A to stand from recliner chair      Balance Overall balance assessment: Needs assistance, History of Falls Sitting-balance support: Feet supported Sitting balance-Leahy Scale: Good     Standing balance support: Bilateral upper extremity supported Standing balance-Leahy Scale: Fair                             ADL either performed or assessed with clinical judgement   ADL Overall ADL's : Needs assistance/impaired                         Toilet Transfer: Moderate assistance;Rolling walker (2 wheels) Toilet Transfer Details (indicate cue type and reason): simulated                 Vision Patient Visual Report: No change from baseline              Pertinent Vitals/Pain Pain Assessment Pain Assessment: No/denies pain     Extremity/Trunk Assessment Upper Extremity Assessment Upper Extremity Assessment: Generalized weakness   Lower Extremity Assessment Lower Extremity Assessment: Generalized weakness       Communication Communication Communication: No apparent difficulties   Cognition Arousal: Alert Behavior During Therapy: WFL for tasks assessed/performed Cognition: No apparent impairments                               Following commands: Intact  Cueing  General Comments   Cueing Techniques: Verbal cues              Home Living Family/patient expects to be discharged to:: Private residence Living Arrangements: Children Available Help at Discharge: Family;Available 24 hours/day Type of Home: House Home Access: Stairs to enter [employer] of Steps: 2 Entrance Stairs-Rails: Can reach both Home Layout: Able to live on main level with bedroom/bathroom     Bathroom Shower/Tub: Walk-in shower         Home Equipment: Agricultural consultant (2 wheels);Rollator (4 wheels);Wheelchair - manual;Grab bars - tub/shower    Additional Comments: sleeps and sits in lift chair      Prior Functioning/Environment               Mobility Comments: uses WC for primary mobility, is able to SPT with RW. Sits in recliner chair 24/7. no recent falls since May 2024 ADLs Comments: daughter helps with getting in/out of shower and donning socks/shoes    OT Problem List: Decreased strength;Decreased range of motion;Decreased safety awareness;Decreased activity tolerance;Decreased knowledge of use of DME or AE;Impaired balance (sitting and/or standing)   OT Treatment/Interventions: Self-care/ADL training;Therapeutic activities;Therapeutic exercise;Energy conservation;Patient/family education;DME and/or AE instruction;Balance training;Canalith reposition      OT Goals(Current goals can be found in the care plan section)   Acute Rehab OT Goals Patient Stated Goal: to go home OT Goal Formulation: With patient Time For Goal Achievement: 02/10/24 Potential to Achieve Goals: Fair ADL Goals Pt Will Perform Grooming: with modified independence;sitting Pt Will Perform Lower Body Dressing: with min assist;sit to/from stand Pt Will Transfer to Toilet: with supervision;ambulating Pt Will Perform Toileting - Clothing Manipulation and hygiene: with supervision;sit to/from stand   OT Frequency:  Min 2X/week       AM-PAC OT "6 Clicks" Daily Activity     Outcome Measure Help from another person eating meals?: None Help from another person taking care of personal grooming?: A Little Help from another person toileting, which includes using toliet, bedpan, or urinal?: A Lot Help from another person bathing (including washing, rinsing, drying)?: A Lot Help from another person to put on and taking off regular upper body clothing?: A Little Help from another person to put on and taking off regular lower body clothing?: A Lot 6 Click Score: 16   End of Session Equipment Utilized During Treatment: Rolling walker (2  wheels);Oxygen (2Ls)  Activity Tolerance: Patient tolerated treatment well Patient left: in chair;with call bell/phone within reach;with family/visitor present  OT Visit Diagnosis: Unsteadiness on feet (R26.81);Repeated falls (R29.6);Muscle weakness (generalized) (M62.81)                Time: 1330-1350 OT Time Calculation (min): 20 min Charges:  OT General Charges $OT Visit: 1 Visit OT Evaluation $OT Eval Moderate Complexity: 1 Mod OT Treatments $Therapeutic Activity: 8-22 mins  George Kinder, MS, OTR/L , CBIS ascom [phone]  01/27/24, 2:38 PM

## 2024-01-27 NOTE — Progress Notes (Signed)
 Pharmacy Antibiotic Note  Christopher [name] is a 73 y.o. male admitted on 01/26/2024 with sepsis/cellulitis of sacral area. PMH significant for morbid obesity, OSA on CPAP, HTN, HLD, CHF, COPD on 2 L O2  Pharmacy has been consulted for Cefepime  and Vancomycin  dosing.  Zosyn  by provider on 6/6.  Plan: Cefepime  2gm IV q 8 hrs Will change Vancomycin  regimen from 1500 mg to Vancomycin  1750 mg IV Q 24 hrs.  Goal AUC 400-550. Expected AUC: 497.5 Expected Cmin: 12.1 SCr used: 1.37  Height: 6' (182.9 cm) Weight: (!) 146.1 kg (322 lb) IBW/kg (Calculated) : 77.6  Temp (24hrs), Avg:98.8 F (37.1 C), Min:98.3 F (36.8 C), Max:100.2 F (37.9 C)  Recent Labs  Lab 01/26/24 0040 01/26/24 0300 01/26/24 0652 01/26/24 1753 01/27/24 0606  WBC 18.0*  --   --   --   --   CREATININE 1.46*  --   --   --  1.33*  LATICACIDVEN 2.0* 3.0* 1.2 1.3  --     Estimated Creatinine Clearance: 73.5 mL/min (A) (by C-G formula based on SCr of 1.33 mg/dL (H)).    Antimicrobials this admission:  6/5 Vancomycin  >>   6/5 Zosyn  >>6/6  6/6 Cefepime  >>  Microbiology results: 6/6 BCx: NG x 1 day   Tyquarius Paglia Rodriguez-Guzman PharmD, BCPS 01/27/2024 12:14 PM

## 2024-01-27 NOTE — Evaluation (Signed)
 Physical Therapy Evaluation Patient Details Name: Christopher [name] MRN: [id number] DOB: [birth year] Today's Date: 01/27/2024  History of Present Illness  Pt admitted for severe sepsis with complaints of not feeling well. History of obesity, HTN, CHF, and COPD. 2L of O2 PRN at home. Was previously getting OP PT.  Clinical Impression  Pt is a pleasant 73 year old male who was admitted for severe sepsis. Pt performs bed mobility with min assist, transfers with min assist, and ambulation with cga and bari RW. All mobility performed on 2L with sats WNL. Pt does fatigue with limited exertion. Multiple sores noted on post thigh and sacrum. Pressure reducing pad ordered for chair. Pt demonstrates deficits with strength/mobility/endurance. Pt is at his baseline and is hopeful to dc home. Encouraged mobility in room and sitting in chair this date, however pt declines at this time. Educated on risk for deconditioning while in hospital. Will engage other therapies. Would benefit from skilled PT to address above deficits and promote optimal return to PLOF. Pt will continue to receive skilled PT services while admitted and will defer to TOC/care team for updates regarding disposition planning.         If plan is discharge home, recommend the following: A lot of help with walking and/or transfers   Can travel by private vehicle        Equipment Recommendations None recommended by PT  Recommendations for Other Services       Functional Status Assessment Patient has had a recent decline in their functional status and demonstrates the ability to make significant improvements in function in a reasonable and predictable amount of time.     Precautions / Restrictions Precautions Precautions: Fall Recall of Precautions/Restrictions: Intact Restrictions Weight Bearing Restrictions Per Provider Order: No      Mobility  Bed Mobility Overal bed mobility: Needs Assistance Bed Mobility: Supine to Sit      Supine to sit: Min assist     General bed mobility comments: pulls up on therapist when initiating mobility to R side of bed. Once seated, is able to scoot out towards EOB with safe technique. Increased labored breathing with exertion. Upon return to bed, needs +2 for repositioning and use of bed functions.    Transfers Overall transfer level: Needs assistance Equipment used: Rolling walker (2 wheels) (bari) Transfers: Sit to/from Stand Sit to Stand: Min assist           General transfer comment: bed elevated, able to progress to stand with decreased bed height. Once standing, wide BOS noted.    Ambulation/Gait Ambulation/Gait assistance: Contact guard assist Gait Distance (Feet): 2 Feet Assistive device: Rolling walker (2 wheels) (bari) Gait Pattern/deviations: Step-to pattern       General Gait Details: ambulated at bedside including several steps in forward/backward direction and up towards HOB. Safe technique, however heavy fatigue and increased SOB symptoms. O2 sats on 2L with sats at 96% post exertion.  Stairs            Wheelchair Mobility     Tilt Bed    Modified Rankin (Stroke Patients Only)       Balance Overall balance assessment: Needs assistance, History of Falls Sitting-balance support: Feet supported Sitting balance-Leahy Scale: Good     Standing balance support: Bilateral upper extremity supported Standing balance-Leahy Scale: Fair                               Pertinent Vitals/Pain  Pain Assessment Pain Assessment: No/denies pain    Home Living Family/patient expects to be discharged to:: Private residence Living Arrangements: Children Available Help at Discharge: Family;Available 24 hours/day Type of Home: House Home Access: Stairs to enter Entrance Stairs-Rails: Can reach both Entrance Stairs-Number of Steps: 2   Home Layout: Able to live on main level with bedroom/bathroom Home Equipment: Agricultural consultant (2  wheels);Rollator (4 wheels);Wheelchair - manual;Grab bars - tub/shower      Prior Function Prior Level of Function : Needs assist             Mobility Comments: uses WC for primary mobility, is able to SPT with RW. Sits in recliner chair 24/7. no recent falls since May 2024 ADLs Comments: daughter helps with getting in/out of shower and donning socks/shoes     Extremity/Trunk Assessment   Upper Extremity Assessment Upper Extremity Assessment: Generalized weakness    Lower Extremity Assessment Lower Extremity Assessment: Generalized weakness (B LE grossly 3/5 with edema present)       Communication   Communication Communication: No apparent difficulties    Cognition Arousal: Alert Behavior During Therapy: WFL for tasks assessed/performed   PT - Cognitive impairments: No apparent impairments                       PT - Cognition Comments: pleasant and agreeable to session Following commands: Intact       Cueing Cueing Techniques: Verbal cues     General Comments      Exercises     Assessment/Plan    PT Assessment Patient needs continued PT services  PT Problem List Decreased strength;Decreased activity tolerance;Decreased balance;Decreased mobility;Decreased knowledge of use of DME;Cardiopulmonary status limiting activity;Obesity;Decreased skin integrity       PT Treatment Interventions DME instruction;Gait training;Therapeutic activities;Therapeutic exercise;Balance training    PT Goals (Current goals can be found in the Care Plan section)  Acute Rehab PT Goals Patient Stated Goal: to go home PT Goal Formulation: With patient Time For Goal Achievement: 02/10/24 Potential to Achieve Goals: Good    Frequency Min 2X/week     Co-evaluation               AM-PAC PT "6 Clicks" Mobility  Outcome Measure Help needed turning from your back to your side while in a flat bed without using bedrails?: A Little Help needed moving from lying on  your back to sitting on the side of a flat bed without using bedrails?: A Little Help needed moving to and from a bed to a chair (including a wheelchair)?: A Little Help needed standing up from a chair using your arms (e.g., wheelchair or bedside chair)?: A Little Help needed to walk in hospital room?: A Lot Help needed climbing 3-5 steps with a railing? : Total 6 Click Score: 15    End of Session Equipment Utilized During Treatment: Oxygen Activity Tolerance: Patient tolerated treatment well Patient left: in bed;with bed alarm set;with family/visitor present Nurse Communication: Mobility status PT Visit Diagnosis: Unsteadiness on feet (R26.81);Muscle weakness (generalized) (M62.81);Difficulty in walking, not elsewhere classified (R26.2)    Time: 1000-1028 PT Time Calculation (min) (ACUTE ONLY): 28 min   Charges:   PT Evaluation $PT Eval Low Complexity: 1 Low PT Treatments $Therapeutic Activity: 8-22 mins PT General Charges $$ ACUTE PT VISIT: 1 Visit         Amparo Balk, PT, DPT, [facility] [phone]   Yarethzy Croak 01/27/2024, 11:01 AM

## 2024-01-27 NOTE — Progress Notes (Signed)
 Progress Note   Patient: Christopher [name] ZOX:[id number] DOB: 1950-09-25 DOA: 01/26/2024     1 DOS: the patient was seen and examined on 01/27/2024   Brief hospital course:  Christopher [name] is a 73 y.o. male with PMH significant for morbid obesity, OSA on CPAP, HTN, HLD, CHF, COPD on 2 L O2. Patient was brought to the ED by EMS for ' not feeling well'.   Patient lives at home, his daughter lives with him.  Per daughter, he spends most of his day on recliner and gets up only to go to the bathroom.  He is able to stand up and go to the bathroom with a walker.  But he has not been house for about 3 weeks.  He has been refusing to go to outpatient PT.   In the past few years, patient has had left ankle surgery, right femur surgery.  He was in a rehab after both procedures.  While at the rehab, his mobility were improved but after returning home, he is not motivated to ambulate.  His wife died in 01-30-21 after which he has progressively declined..  Patient gets sore at the bottom, sometimes picks at it.  Uses some kind of cream.  Never had it infected or required surgery.   In the ED, patient had a temperature of 101.9, heart rate of 113, blood pressure in 90s [place] labs with WC count 18, lactic acid 2, BUN/creatinine 30/1.46 Urinalysis clear yellow with small leukocytes, negative nitrite no bacteria Blood culture was sent CT chest abdomen pelvis was obtained which showed skin thickening in the intergluteal cleft, L>R suggestive of cellulitis, without any fluid collection or deep inflammation. Patient was started on broad-spectrum IV antibiotics with IV Zosyn  and vancomycin  Hospitalist service was consulted for inpatient management While in the ED, patient's blood pressure dropped down to 80s.  He was given fluid boluses.   PCCM was consulted.  Recommended further resuscitation and monitoring.  Did not meet ICU criteria.   I received this patient this morning for admission as a carryover from last  night. At the time of my evaluation, patient was alert, awake, oriented x 3, his daughter was at bedside who helped with most of the history.  History reviewed and detailed as above.   With the help of nursing staff, I stood up the patient and took pictures of his bottom.    Assessment and Plan:  Severe sepsis POA Cellulitis of sacral area Presented with fever, leukocytosis, lactic acidosis and hypotension on admission which has improved Patient has chronically low blood pressure and is on midodrine  Source of sepsis appears to be cellulitis of the sacral area CT scan suggestive of sacral area cellulitis without open ulcers or fluid collection or deep infection. Blood cultures sterile Leukocytosis has normalized Continue broad-spectrum antibiotic therapy with vancomycin  and cefepime      COPD  Chronic respiratory failure on 2 L oxygen Continue supplemental oxygen and bronchodilators    Impaired mobility PT evaluation.   Hypothyroidism Continue Synthroid    Morbid Obesity, class III Body mass index is 43.67 kg/m.  Patient has been advised to make an attempt to improve diet and exercise patterns to aid in weight loss.   OSA Noncompliant with CPAP   Chronic diastolic dysfunction CHF Not acutely exacerbated Torsemide  is on hold due to hypotension Last 2D echocardiogram from 01-31-2023 shows an LVEF of 55 to 60%      Subjective: No new complaints.  Family at the bedside  Physical Exam: Vitals:  01/26/24 2318 01/27/24 0835 01/27/24 0850 01/27/24 1244  BP: (!) 108/48 (!) 98/44 (!) 102/38 (!) 105/41  Pulse: 75 76  77  Resp: 18 18  18   Temp: 98.8 F (37.1 C) 98.3 F (36.8 C)  98.1 F (36.7 C)  TempSrc:  Oral    SpO2: 98% 100%  100%  Weight:      Height:       General exam: Pleasant, elderly morbidly obese male on low-flow oxygen.  Not in pain at rest, disheveled Skin: No rashes, lesions or ulcers. HEENT: Atraumatic, normocephalic, no obvious bleeding Lungs: Clear to  auscultation bilaterally, diminished air entry in both bases CVS: S1, S2, no murmur,   GI/Abd: Soft, nontender, distended from obesity, bowel sound present,  Back: Examined to look for decubitus ulcers.  Only superficial cellulitis noted.  Pictures attached CNS: Alert, awake, oriented x 3 Psychiatry: Mood appropriate,  Extremities: Chronic puffy legs with chronic cellulitis.  Not worse than baseline per family.  No calf tenderness.                                     Data Reviewed: Serum creatinine 1.37, white count 10 Labs reviewed  Family Communication: Plan of care discussed with patient's children at the bedside.  All questions and concerns have been addressed.  They verbalized understanding and agree with the plan  Disposition: Status is: Inpatient Remains inpatient appropriate because: On IV antibiotics  Planned Discharge Destination: TBD    Time spent: 40 minutes  Author: Read Camel, MD 01/27/2024 3:38 PM  For on call review [email or web address].

## 2024-01-27 NOTE — Progress Notes (Signed)
  Progress Note   Date: 01/27/2024  Patient Name: Christopher [name]        MRN#: [id number]  Clarification of the type of atrial fibrillation:  Paroxysmal atrial fibrillation

## 2024-01-27 NOTE — Plan of Care (Signed)
  Problem: Fluid Volume: Goal: Hemodynamic stability will improve Outcome: Progressing   Problem: Clinical Measurements: Goal: Diagnostic test results will improve Outcome: Progressing   Problem: Respiratory: Goal: Ability to maintain adequate ventilation will improve Outcome: Progressing   Problem: Education: Goal: Knowledge of General Education information will improve Description: Including pain rating scale, medication(s)/side effects and non-pharmacologic comfort measures Outcome: Progressing   Problem: Clinical Measurements: Goal: Ability to maintain clinical measurements within normal limits will improve Outcome: Progressing Goal: Diagnostic test results will improve Outcome: Progressing Goal: Respiratory complications will improve Outcome: Progressing Goal: Cardiovascular complication will be avoided Outcome: Progressing   Problem: Activity: Goal: Risk for activity intolerance will decrease Outcome: Progressing   Problem: Nutrition: Goal: Adequate nutrition will be maintained Outcome: Progressing   Problem: Coping: Goal: Level of anxiety will decrease Outcome: Progressing   Problem: Elimination: Goal: Will not experience complications related to bowel motility Outcome: Progressing Goal: Will not experience complications related to urinary retention Outcome: Progressing   Problem: Pain Managment: Goal: General experience of comfort will improve and/or be controlled Outcome: Progressing   Problem: Safety: Goal: Ability to remain free from injury will improve Outcome: Progressing   Problem: Skin Integrity: Goal: Risk for impaired skin integrity will decrease Outcome: Progressing   Problem: Clinical Measurements: Goal: Ability to avoid or minimize complications of infection will improve Outcome: Progressing   Problem: Skin Integrity: Goal: Skin integrity will improve Outcome: Progressing

## 2024-01-27 NOTE — Progress Notes (Signed)
  Progress Note   Date: 01/27/2024  Patient Name: Christopher [name]        MRN#: [id number]  Clarification of diagnosis:    is now resolved and the clinical data/criteria used for diagnosing was ______. Patient has chronic diastolic dysfunction CHF

## 2024-01-28 DIAGNOSIS — R6520 Severe sepsis without septic shock: Secondary | ICD-10-CM | POA: Diagnosis not present

## 2024-01-28 DIAGNOSIS — A419 Sepsis, unspecified organism: Secondary | ICD-10-CM | POA: Diagnosis not present

## 2024-01-28 NOTE — Care Management Important Message (Signed)
 Important Message  Patient Details  Name: Christopher [name] MRN: [id number] Date of Birth: [birth year]   Important Message Given:  Yes - Medicare IM     Anise Kerns 01/28/2024, 1:19 PM

## 2024-01-28 NOTE — Progress Notes (Signed)
 Progress Note   Patient: Christopher [name] DOB: [birth year] DOA: 01/26/2024     2 DOS: the patient was seen and examined on 01/28/2024   Brief hospital course:  Christopher [name] is a 73 y.o. male with PMH significant for morbid obesity, OSA on CPAP, HTN, HLD, CHF, COPD on 2 L O2. Patient was brought to the ED by EMS for ' not feeling well'.   Patient lives at home, his daughter lives with him.  Per daughter, he spends most of his day on recliner and gets up only to go to the bathroom.  He is able to stand up and go to the bathroom with a walker.  But he has not been house for about 3 weeks.  He has been refusing to go to outpatient PT.   In the past few years, patient has had left ankle surgery, right femur surgery.  He was in a rehab after both procedures.  While at the rehab, his mobility were improved but after returning home, he is not motivated to ambulate.  His wife died in 02-14-21 after which he has progressively declined..  Patient gets sore at the bottom, sometimes picks at it.  Uses some kind of cream.  Never had it infected or required surgery.   In the ED, patient had a temperature of 101.9, heart rate of 113, blood pressure in 90s [place] labs with WC count 18, lactic acid 2, BUN/creatinine 30/1.46 Urinalysis clear yellow with small leukocytes, negative nitrite no bacteria Blood culture was sent CT chest abdomen pelvis was obtained which showed skin thickening in the intergluteal cleft, L>R suggestive of cellulitis, without any fluid collection or deep inflammation. Patient was started on broad-spectrum IV antibiotics with IV Zosyn  and vancomycin  Hospitalist service was consulted for inpatient management While in the ED, patient's blood pressure dropped down to 80s.  He was given fluid boluses.   PCCM was consulted.  Recommended further resuscitation and monitoring.  Did not meet ICU criteria.   I received this patient this morning for admission as a carryover from last  night. At the time of my evaluation, patient was alert, awake, oriented x 3, his daughter was at bedside who helped with most of the history.  History reviewed and detailed as above.   With the help of nursing staff, I stood up the patient and took pictures of his bottom.      Assessment and Plan:  Severe sepsis POA Cellulitis of sacral area Presented with fever, leukocytosis, lactic acidosis and hypotension on admission which has improved Patient has chronically low blood pressure and is on midodrine  Source of sepsis appears to be cellulitis of the sacral area CT scan suggestive of sacral area cellulitis without open ulcers or fluid collection or deep infection. Blood cultures sterile Leukocytosis has resolved and blood pressure is stable Continue broad-spectrum antibiotic therapy with vancomycin  and cefepime  to complete a 5-7 day course of therapy       COPD  Chronic respiratory failure on 2 L oxygen Continue supplemental oxygen and bronchodilators     Impaired mobility Appreciate PT evaluation. Home health PT upon discharge   Hypothyroidism Continue Synthroid    Morbid Obesity, class III Body mass index is 43.67 kg/m.  Patient has been advised to make an attempt to improve diet and exercise patterns to aid in weight loss.   OSA Noncompliant with CPAP     Chronic diastolic dysfunction CHF Not acutely exacerbated Torsemide  is on hold due to hypotension Last 2D echocardiogram from 2023/02/15  shows an LVEF of 55 to 60%        Subjective: No new complaints.  Daughter at the bedside  Physical Exam: Vitals:   01/28/24 0007 01/28/24 0347 01/28/24 0811 01/28/24 1206  BP: (!) 155/83 130/68 110/62 114/67  Pulse: 79 68 65 93  Resp: 19 19 18 18   Temp: 97.9 F (36.6 C) 98 F (36.7 C) 97.6 F (36.4 C) 97.8 F (36.6 C)  TempSrc:   Oral Oral  SpO2: 99% 99% 100% 98%  Weight:      Height:       General exam: Pleasant, elderly morbidly obese male on low-flow oxygen.  Not  in pain at rest, disheveled Skin: No rashes, lesions or ulcers. HEENT: Atraumatic, normocephalic, no obvious bleeding Lungs: Clear to auscultation bilaterally, diminished air entry in both bases CVS: S1, S2, no murmur,   GI/Abd: Soft, nontender, distended from obesity, bowel sound present,  Back: Examined to look for decubitus ulcers.  Only superficial cellulitis noted.  Pictures attached CNS: Alert, awake, oriented x 3 Psychiatry: Mood appropriate,  Extremities: Chronic puffy legs with chronic cellulitis.  Not worse than baseline per family.  No calf tenderness.                                     Data Reviewed:  There are no new results to review at this time.  Family Communication: Plan of care discussed with patient and his daughter at the bedside.  They verbalized understanding and agree with the plan  Disposition: Status is: Inpatient Remains inpatient appropriate because: To complete IV antibiotic therapy  Planned Discharge Destination: Home with Home Health    Time spent: 35 minutes  Author: Read Camel, MD 01/28/2024 2:32 PM  For on call review [email or web address].

## 2024-01-29 DIAGNOSIS — A419 Sepsis, unspecified organism: Secondary | ICD-10-CM | POA: Diagnosis not present

## 2024-01-29 DIAGNOSIS — R6520 Severe sepsis without septic shock: Secondary | ICD-10-CM | POA: Diagnosis not present

## 2024-01-29 LAB — CREATININE, SERUM
Creatinine, Ser: 1.32 mg/dL — ABNORMAL HIGH (ref 0.61–1.24)
GFR, Estimated: 57 mL/min — ABNORMAL LOW (ref >60)

## 2024-01-29 MED ORDER — CEFAZOLIN SODIUM-DEXTROSE 2-4 GM/100ML-% IV SOLN
2.0000 g | Freq: Three times a day (TID) | INTRAVENOUS | Status: DC
  Administered 2024-01-29 – 2024-01-30 (×3): 2 g via INTRAVENOUS
  Filled 2024-01-29 (×4): qty 100

## 2024-01-29 NOTE — Progress Notes (Signed)
 Progress Note   Patient: Christopher [name] WUJ:[id number] DOB: Apr 09, 1951 DOA: 01/26/2024     3 DOS: the patient was seen and examined on 01/29/2024   Brief hospital course:  Christopher [name] is a 73 y.o. male with PMH significant for morbid obesity, OSA on CPAP, HTN, HLD, CHF, COPD on 2 L O2. Patient was brought to the ED by EMS for ' not feeling well'.   Patient lives at home, his daughter lives with him.  Per daughter, he spends most of his day on recliner and gets up only to go to the bathroom.  He is able to stand up and go to the bathroom with a walker.  But he has not been house for about 3 weeks.  He has been refusing to go to outpatient PT.   In the past few years, patient has had left ankle surgery, right femur surgery.  He was in a rehab after both procedures.  While at the rehab, his mobility were improved but after returning home, he is not motivated to ambulate.  His wife died in 02-07-2021 after which he has progressively declined..  Patient gets sore at the bottom, sometimes picks at it.  Uses some kind of cream.  Never had it infected or required surgery.   In the ED, patient had a temperature of 101.9, heart rate of 113, blood pressure in 90s [place] labs with WC count 18, lactic acid 2, BUN/creatinine 30/1.46 Urinalysis clear yellow with small leukocytes, negative nitrite no bacteria Blood culture was sent CT chest abdomen pelvis was obtained which showed skin thickening in the intergluteal cleft, L>R suggestive of cellulitis, without any fluid collection or deep inflammation. Patient was started on broad-spectrum IV antibiotics with IV Zosyn  and vancomycin  Hospitalist service was consulted for inpatient management While in the ED, patient's blood pressure dropped down to 80s.  He was given fluid boluses.   PCCM was consulted.  Recommended further resuscitation and monitoring.  Did not meet ICU criteria.     Assessment and Plan:  Severe sepsis POA Cellulitis of sacral  area Presented with fever, leukocytosis, lactic acidosis and hypotension on admission which has improved Patient has chronically low blood pressure and is on midodrine . Blood pressure is stable Source of sepsis appears to be cellulitis of the sacral area CT scan suggestive of sacral area cellulitis without open ulcers or fluid collection or deep infection. Blood culture is sterile Leukocytosis has resolved and blood pressure is stable Will de escalate abx therapy to IV Ancef  and will discharge home on Keflex         COPD  Chronic respiratory failure on 2 L oxygen Continue supplemental oxygen and bronchodilators     Impaired mobility Appreciate PT evaluation. Home health PT upon discharge   Hypothyroidism Continue Synthroid    Morbid Obesity, class III Body mass index is 43.67 kg/m.  Patient has been advised to make an attempt to improve diet and exercise patterns to aid in weight loss.   OSA Noncompliant with CPAP     Chronic diastolic dysfunction CHF Not acutely exacerbated Will resume Torsemide  in am Last 2D echocardiogram from February 08, 2023 shows an LVEF of 55 to 60%       Subjective: No new complaints  Physical Exam: Vitals:   01/29/24 0037 01/29/24 0437 01/29/24 0817 01/29/24 1213  BP: (!) 103/50 (!) 130/56 115/65 124/65  Pulse: 69 73 70 63  Resp: 20 19 15 19   Temp: 98.4 F (36.9 C) 98.5 F (36.9 C) 98.3 F (36.8 C) 97.7  F (36.5 C)  TempSrc: Oral Oral    SpO2: 97% 100% 100% 100%  Weight:      Height:       General exam: Pleasant, elderly morbidly obese male on low-flow oxygen.  Not in pain at rest, disheveled Skin: No rashes, lesions or ulcers. HEENT: Atraumatic, normocephalic, no obvious bleeding Lungs: Clear to auscultation bilaterally, diminished air entry in both bases CVS: S1, S2, no murmur,   GI/Abd: Soft, nontender, distended from obesity, bowel sound present,  Back: Examined to look for decubitus ulcers.  Only superficial cellulitis noted.   Pictures attached CNS: Alert, awake, oriented x 3 Psychiatry: Mood appropriate,  Extremities: Chronic puffy legs with chronic venous stasis.  Not worse than baseline per family.  No calf tenderness.                                       Data Reviewed:  Labs reviewed  Family Communication: Plan of care was discussed with patient and his son at the bedside. They verbalize understanding and agree with the plan  Disposition: Status is: Inpatient Remains inpatient appropriate because: For discharge home with home health   Planned Discharge Destination: Home with Home Health    Time spent: 33 minutes  Author: Read Camel, MD 01/29/2024 1:42 PM  For on call review [email or web address].

## 2024-01-29 NOTE — Progress Notes (Signed)
 Physical Therapy Treatment Patient Details Name: Christopher [name] MRN: [id number] DOB: 1951/08/05 Today's Date: 01/29/2024   History of Present Illness Pt admitted for severe sepsis with complaints of not feeling well. History of obesity, HTN, CHF, and COPD. 2L of O2 PRN at home. Was previously getting OP PT.    PT Comments  Pt in bed.  Stated he has not been up this weekend.  Encouraged pt to get up at least daily with nursing staff and ask when he wants to get up.  Voiced understanding.  He gets to EOB with min a x 1 and bed features.  Stated he sleeps in recliner at home and does not use his bed.  Stands from bed similar to height of recliner to endure he can stand with +1-+2 assist when it is time to return to bed.  Pressure cushion in recliner.  He is able to stand with mod a x 1 and take several small steps to recliner with min a x 1 and heavy reliance on RW.  Stated transfer is at baseline and he is mostly wheelchair/recliner bound at home.  Does not feel he needs any equipment at home.  Encouraged to call for assist when ready to get back to bed.  He does do seated AROM BLE in chair and is encouraged to do HEP on is own during the day which he stated he knows but has not been doing.   If plan is discharge home, recommend the following: A lot of help with walking and/or transfers;Assist for transportation;Assistance with cooking/housework;A little help with bathing/dressing/bathroom   Can travel by private vehicle        Equipment Recommendations  None recommended by PT    Recommendations for Other Services       Precautions / Restrictions Precautions Precautions: Fall Recall of Precautions/Restrictions: Intact Restrictions Weight Bearing Restrictions Per Provider Order: No     Mobility  Bed Mobility Overal bed mobility: Needs Assistance Bed Mobility: Supine to Sit     Supine to sit: Used rails, HOB elevated, Contact guard       Patient Response:  Cooperative  Transfers Overall transfer level: Needs assistance Equipment used: Rolling walker (2 wheels) Transfers: Sit to/from Stand Sit to Stand: Mod assist, Min assist           General transfer comment: min/mod from lower bed height to simulate recliner    Ambulation/Gait Ambulation/Gait assistance: Contact guard assist Gait Distance (Feet): 3 Feet Assistive device: Rolling walker (2 wheels) Gait Pattern/deviations: Step-to pattern Gait velocity: dec     General Gait Details: several steps to chair reliant on RW but stated he is at baseline   Stairs             Wheelchair Mobility     Tilt Bed Tilt Bed Patient Response: Cooperative  Modified Rankin (Stroke Patients Only)       Balance Overall balance assessment: Needs assistance, History of Falls Sitting-balance support: Feet supported Sitting balance-Leahy Scale: Good     Standing balance support: Bilateral upper extremity supported Standing balance-Leahy Scale: Fair                              Hotel manager: No apparent difficulties  Cognition Arousal: Alert Behavior During Therapy: WFL for tasks assessed/performed   PT - Cognitive impairments: No apparent impairments  PT - Cognition Comments: pleasant and agreeable to session Following commands: Intact      Cueing Cueing Techniques: Verbal cues  Exercises Other Exercises Other Exercises: seated AROM review    General Comments        Pertinent Vitals/Pain Pain Assessment Pain Assessment: No/denies pain    Home Living                          Prior Function            PT Goals (current goals can now be found in the care plan section) Progress towards PT goals: Progressing toward goals    Frequency    Min 2X/week      PT Plan      Co-evaluation              AM-PAC PT "6 Clicks" Mobility   Outcome Measure  Help needed  turning from your back to your side while in a flat bed without using bedrails?: A Little Help needed moving from lying on your back to sitting on the side of a flat bed without using bedrails?: A Little Help needed moving to and from a bed to a chair (including a wheelchair)?: A Little Help needed standing up from a chair using your arms (e.g., wheelchair or bedside chair)?: A Little Help needed to walk in hospital room?: A Lot Help needed climbing 3-5 steps with a railing? : Total 6 Click Score: 15    End of Session Equipment Utilized During Treatment: Oxygen Activity Tolerance: Patient tolerated treatment well Patient left: in chair;with call bell/phone within reach;with chair alarm set Nurse Communication: Mobility status PT Visit Diagnosis: Unsteadiness on feet (R26.81);Muscle weakness (generalized) (M62.81);Difficulty in walking, not elsewhere classified (R26.2)     Time: 0981-1914 PT Time Calculation (min) (ACUTE ONLY): 13 min  Charges:    $Therapeutic Activity: 8-22 mins PT General Charges $$ ACUTE PT VISIT: 1 Visit                   Charlanne Cong, PTA 01/29/24, 3:43 PM

## 2024-01-29 NOTE — Plan of Care (Signed)
  Problem: Fluid Volume: Goal: Hemodynamic stability will improve Outcome: Progressing   Problem: Clinical Measurements: Goal: Diagnostic test results will improve Outcome: Progressing Goal: Signs and symptoms of infection will decrease Outcome: Progressing   Problem: Respiratory: Goal: Ability to maintain adequate ventilation will improve Outcome: Progressing   Problem: Education: Goal: Knowledge of General Education information will improve Description: Including pain rating scale, medication(s)/side effects and non-pharmacologic comfort measures Outcome: Progressing   Problem: Health Behavior/Discharge Planning: Goal: Ability to manage health-related needs will improve Outcome: Progressing   Problem: Clinical Measurements: Goal: Ability to maintain clinical measurements within normal limits will improve Outcome: Progressing Goal: Will remain free from infection Outcome: Progressing Goal: Diagnostic test results will improve Outcome: Progressing Goal: Respiratory complications will improve Outcome: Progressing Goal: Cardiovascular complication will be avoided Outcome: Progressing   Problem: Activity: Goal: Risk for activity intolerance will decrease Outcome: Progressing   Problem: Nutrition: Goal: Adequate nutrition will be maintained Outcome: Progressing   Problem: Coping: Goal: Level of anxiety will decrease Outcome: Progressing   Problem: Elimination: Goal: Will not experience complications related to bowel motility Outcome: Progressing Goal: Will not experience complications related to urinary retention Outcome: Progressing   Problem: Pain Managment: Goal: General experience of comfort will improve and/or be controlled Outcome: Progressing   Problem: Safety: Goal: Ability to remain free from injury will improve Outcome: Progressing   Problem: Skin Integrity: Goal: Risk for impaired skin integrity will decrease Outcome: Progressing   Problem:  Clinical Measurements: Goal: Ability to avoid or minimize complications of infection will improve Outcome: Progressing   Problem: Skin Integrity: Goal: Skin integrity will improve Outcome: Progressing

## 2024-01-29 NOTE — Plan of Care (Signed)

## 2024-01-30 DIAGNOSIS — A419 Sepsis, unspecified organism: Secondary | ICD-10-CM | POA: Diagnosis not present

## 2024-01-30 DIAGNOSIS — R6520 Severe sepsis without septic shock: Secondary | ICD-10-CM | POA: Diagnosis not present

## 2024-01-30 DIAGNOSIS — L89152 Pressure ulcer of sacral region, stage 2: Secondary | ICD-10-CM | POA: Diagnosis present

## 2024-01-30 LAB — BASIC METABOLIC PANEL WITH GFR
Anion gap: 5 (ref 5–15)
BUN: 20 mg/dL (ref 8–23)
CO2: 26 mmol/L (ref 22–32)
Calcium: 8.6 mg/dL — ABNORMAL LOW (ref 8.9–10.3)
Chloride: 109 mmol/L (ref 98–111)
Creatinine, Ser: 1.19 mg/dL (ref 0.61–1.24)
GFR, Estimated: >60 mL/min (ref >60)
Glucose, Bld: 108 mg/dL — ABNORMAL HIGH (ref 70–99)
Potassium: 4 mmol/L (ref 3.5–5.1)
Sodium: 140 mmol/L (ref 135–145)

## 2024-01-30 LAB — CBC
HCT: 37.8 % — ABNORMAL LOW (ref 39.0–52.0)
Hemoglobin: 11.7 g/dL — ABNORMAL LOW (ref 13.0–17.0)
MCH: 26.2 pg (ref 26.0–34.0)
MCHC: 31 g/dL (ref 30.0–36.0)
MCV: 84.6 fL (ref 80.0–100.0)
Platelets: 198 10*3/uL (ref 150–400)
RBC: 4.47 MIL/uL (ref 4.22–5.81)
RDW: 15.3 % (ref 11.5–15.5)
WBC: 7.2 10*3/uL (ref 4.0–10.5)
nRBC: 0 % (ref 0.0–0.2)

## 2024-01-30 MED ORDER — POLYETHYLENE GLYCOL 3350 17 G PO PACK: 17.0000 g | PACK | Freq: Every day | ORAL | Status: DC

## 2024-01-30 MED ORDER — POLYETHYLENE GLYCOL 3350 17 G PO PACK: 17.0000 g | PACK | Freq: Every day | ORAL | Status: AC

## 2024-01-30 MED ORDER — BUPROPION HCL ER (SR) 150 MG PO TB12: 150.0000 mg | ORAL_TABLET | Freq: Two times a day (BID) | ORAL | 0 refills | Status: AC

## 2024-01-30 MED ORDER — CEPHALEXIN 500 MG PO CAPS: 500.0000 mg | ORAL_CAPSULE | Freq: Four times a day (QID) | ORAL | 0 refills | Status: AC

## 2024-01-30 NOTE — TOC Transition Note (Signed)
 Transition of Care [facility]) - Discharge Note   Patient Details  Name: Christopher [name] MRN: [id number] Date of Birth: October 05, 1950  [facility]) CM/SW Contact:  Noemy Hallmon C Antwyne Pingree, RN Phone Number: 01/30/2024, 10:47 AM   Clinical Narrative:    Spoke with patient's daughter, [name] to advise to expect a call from [facility] to scheduled patient's SOC.   Shaun from [facility] notified of patient's discharge for today.   TOC signing off.           Patient Goals and CMS Choice            Discharge Placement                       Discharge Plan and Services Additional resources added to the After Visit Summary for                                       Social Drivers of Health (SDOH) Interventions SDOH Screenings   Food Insecurity: No Food Insecurity (01/26/2024)  Housing: Low Risk  (01/26/2024)  Transportation Needs: No Transportation Needs (01/26/2024)  Utilities: Not At Risk (01/26/2024)  Depression (PHQ2-9): Low Risk  (10/20/2022)  Social Connections: Unknown (01/26/2024)  Tobacco Use: Low Risk  (01/26/2024)     Readmission Risk Interventions     No data to display

## 2024-01-30 NOTE — Discharge Summary (Signed)
 Physician Discharge Summary   Patient: Christopher [name] MRN: [id number] DOB: [birth year]  Admit date:     01/26/2024  Discharge date: 01/30/24  Discharge Physician: Jerauld Bostwick   PCP: Sari Cunning, MD   Recommendations at discharge:    Complete antibiotic therapy as recommended Home health PT upon discharge  Discharge Diagnoses: Principal Problem:   Severe sepsis [facility]) Active Problems:   Sepsis due to cellulitis (HCC)   Paroxysmal atrial fibrillation (HCC)   Sleep apnea   Morbid obesity with BMI of 45.0-49.9, adult (HCC)   Depression   Hypothyroidism   Chronic diastolic CHF (congestive heart failure) (HCC)   Benign essential hypertension   Chronic respiratory failure with hypoxia (HCC)   Decubitus ulcer of sacral region, stage 2 (HCC)  Resolved Problems:   * No resolved hospital problems. *  Hospital Course: Christopher [name] is a 73 y.o. male with PMH significant for morbid obesity, OSA on CPAP, HTN, HLD, CHF, COPD on 2 L O2. Patient was brought to the ED by EMS for ' not feeling well'.   Patient lives at home, his daughter lives with him.  Per daughter, he spends most of his day on recliner and gets up only to go to the bathroom.  He is able to stand up and go to the bathroom with a walker.  But he has not been house for about 3 weeks.  He has been refusing to go to outpatient PT.   In the past few years, patient has had left ankle surgery, right femur surgery.  He was in a rehab after both procedures.  While at the rehab, his mobility were improved but after returning home, he is not motivated to ambulate.  His wife died in 2021/02/24 after which he has progressively declined..  Patient gets sore at the bottom, sometimes picks at it.  Uses some kind of cream.  Never had it infected or required surgery.   In the ED, patient had a temperature of 101.9, heart rate of 113, blood pressure in 90s [place] labs with WC count 18, lactic acid 2, BUN/creatinine 30/1.46 Urinalysis clear  yellow with small leukocytes, negative nitrite no bacteria Blood culture was sent CT chest abdomen pelvis was obtained which showed skin thickening in the intergluteal cleft, L>R suggestive of cellulitis, without any fluid collection or deep inflammation. Patient was started on broad-spectrum IV antibiotics with IV Zosyn  and vancomycin  Hospitalist service was consulted for inpatient management While in the ED, patient's blood pressure dropped down to 80s.  He was given fluid boluses.   PCCM was consulted.  Recommended further resuscitation and monitoring.  Did not meet ICU criteria.     Assessment and Plan:  Severe sepsis POA Cellulitis of sacral area Presented with fever, leukocytosis, lactic acidosis and hypotension on admission which has improved Patient has chronically low blood pressure and is on midodrine . Blood pressure is stable Source of sepsis appears to be cellulitis of the sacral area CT scan suggestive of sacral area cellulitis without open ulcers or fluid collection or deep infection. Blood culture is sterile Leukocytosis has resolved  Patient will be discharged home on a course of Keflex .        COPD  Chronic respiratory failure on 2 L oxygen Continue supplemental oxygen and bronchodilators     Impaired mobility Appreciate PT evaluation. Home health PT upon discharge    Hypothyroidism Continue Synthroid     Morbid Obesity, class III OSA Body mass index is 43.67 kg/m.  Patient has been advised  to make an attempt to improve diet and exercise patterns to aid in weight loss. Noncompliant with CPAP     Chronic diastolic dysfunction CHF Not acutely exacerbated Continue Torsemide  in am Last 2D echocardiogram from 2024 shows an LVEF of 55 to 60%     Stage II sacral pressure ulcer Wound care as recommended       Consultants: None Procedures performed: None Disposition: Home health Diet recommendation:  Discharge Diet Orders (From admission,  onward)     Start     Ordered   01/30/24 0000  Diet - low sodium heart healthy        01/30/24 1050           Cardiac diet DISCHARGE MEDICATION: Allergies as of 01/30/2024       Reactions   Propranolol  Other (See Comments)   Hallucinations   Albuterol  Other (See Comments)   Causes afib    Gramineae Pollens Other (See Comments)   Sneeze and watery eyes Sneeze and watery eyes   Other Other (See Comments)   Other reaction(s): Other (See Comments) Unable to breath when around cats Other reaction(s): Other (See Comments) Unable to breath when around cats        Medication List     STOP taking these medications    alum & mag hydroxide-simeth 200-200-20 MG/5ML suspension Commonly known as: MAALOX/MYLANTA   apixaban  5 MG Tabs tablet Commonly known as: ELIQUIS    HYDROcodone -acetaminophen  5-325 MG tablet Commonly known as: NORCO/VICODIN   levalbuterol  45 MCG/ACT inhaler Commonly known as: XOPENEX  HFA   methocarbamol  500 MG tablet Commonly known as: ROBAXIN    ondansetron  4 MG tablet Commonly known as: ZOFRAN    predniSONE 10 MG tablet Commonly known as: DELTASONE       TAKE these medications    acetaminophen  325 MG tablet Commonly known as: TYLENOL  Take 2 tablets (650 mg total) by mouth every 6 (six) hours as needed for mild pain, fever, headache or moderate pain.   aspirin  EC 81 MG tablet Take 81 mg by mouth at bedtime.   atorvastatin  20 MG tablet Commonly known as: LIPITOR Take 20 mg by mouth at bedtime.   bisacodyl  10 MG suppository Commonly known as: DULCOLAX Place 1 suppository (10 mg total) rectally daily as needed for moderate constipation.   buPROPion  150 MG 12 hr tablet Commonly known as: WELLBUTRIN  SR Take 1 tablet (150 mg total) by mouth 2 (two) times daily. After 1st meal of day and after dinner   cephALEXin  500 MG capsule Commonly known as: KEFLEX  Take 1 capsule (500 mg total) by mouth 4 (four) times daily for 5 days.    Cholecalciferol  25 MCG (1000 UT) capsule Take 1,000 Units by mouth at bedtime.   cyanocobalamin  1000 MCG tablet Take 1,000 mcg by mouth 2 (two) times a week. At bedtime. Sundays and Wednesdays   Flovent  HFA 110 MCG/ACT inhaler Generic drug: fluticasone  Inhale 1 puff into the lungs 2 (two) times daily.   ipratropium 0.06 % nasal spray Commonly known as: ATROVENT  Place 2 sprays into both nostrils 3 (three) times daily as needed for rhinitis.   levothyroxine  88 MCG tablet Commonly known as: SYNTHROID  Take 88 mcg by mouth daily. 30 to 60 minutes before breakfast on an empty stomach and with a glass of water   loratadine  10 MG tablet Commonly known as: CLARITIN  Take 10 mg by mouth daily.   Magnesium  250 MG Tabs Take 1 tablet by mouth daily.   melatonin 3 MG Tabs tablet  Take 3 mg by mouth at bedtime.   midodrine  10 MG tablet Commonly known as: PROAMATINE  Take 1 tablet (10 mg total) by mouth 3 (three) times daily.   polyethylene glycol 17 g packet Commonly known as: MIRALAX  / GLYCOLAX  Take 17 g by mouth daily. What changed: when to take this   potassium chloride  SA 20 MEQ tablet Commonly known as: KLOR-CON  M Take 1 tablet (20 mEq total) by mouth daily. When taking torsemide    PreserVision AREDS 2 Caps Take 1 capsule by mouth 2 (two) times daily. With food. After 1st meal of day and after dinner   senna-docusate 8.6-50 MG tablet Commonly known as: Senokot-S Take 1 tablet by mouth 2 (two) times daily.   sertraline  50 MG tablet Commonly known as: ZOLOFT  Take 50 mg by mouth daily.   torsemide  20 MG tablet Commonly known as: DEMADEX  Take 1 tablet (20 mg total) by mouth daily as needed (severe edema).   trolamine salicylate 10 % cream Commonly known as: ASPERCREME Apply 1 application topically as needed for muscle pain. Apply to knees and feet   vitamin A & D ointment Apply 1 application topically as needed. To groin area.        Discharge Exam: Cleavon Curls Weights    01/26/24 0137  Weight: (!) 146.1 kg   General exam: Pleasant, elderly morbidly obese male on low-flow oxygen.  Not in pain at rest, disheveled Skin: No rashes, lesions or ulcers. HEENT: Atraumatic, normocephalic, no obvious bleeding Lungs: Clear to auscultation bilaterally, diminished air entry in both bases CVS: S1, S2, no murmur,   GI/Abd: Soft, nontender, distended from obesity, bowel sound present,  Back: Examined to look for decubitus ulcers.  Only superficial cellulitis noted.  Pictures attached CNS: Alert, awake, oriented x 3 Psychiatry: Mood appropriate,  Extremities: Chronic puffy legs with chronic venous stasis.  Not worse than baseline per family.  No calf tenderness.                                       Condition at discharge: stable  The results of significant diagnostics from this hospitalization (including imaging, microbiology, ancillary and laboratory) are listed below for reference.   Imaging Studies: CT CHEST ABDOMEN PELVIS W CONTRAST Result Date: 01/26/2024 CLINICAL DATA:  Sepsis EXAM: CT CHEST, ABDOMEN, AND PELVIS WITH CONTRAST TECHNIQUE: Multidetector CT imaging of the chest, abdomen and pelvis was performed following the standard protocol during bolus administration of intravenous contrast. RADIATION DOSE REDUCTION: This exam was performed according to the departmental dose-optimization program which includes automated exposure control, adjustment of the mA and/or kV according to patient size and/or use of iterative reconstruction technique. CONTRAST:  OMNIPAQUE  IOHEXOL  350 MG/ML SOLN COMPARISON:  05/20/2021 FINDINGS: CT CHEST FINDINGS Cardiovascular: No significant coronary artery calcification. Global cardiac size within normal limits. No pericardial effusion. Central pulmonary arteries are enlarged in keeping with changes of pulmonary arterial hypertension. No significant atherosclerotic calcification within the thoracic aorta. No aortic aneurysm.  Mediastinum/Nodes: No enlarged mediastinal, hilar, or axillary lymph nodes. Thyroid gland, trachea, and esophagus demonstrate no significant findings. Lungs/Pleura: Lungs are clear. No pleural effusion or pneumothorax. Musculoskeletal: No chest wall mass or suspicious bone lesions identified. CT ABDOMEN PELVIS FINDINGS Hepatobiliary: Cholelithiasis without superimposed pericholecystic inflammatory change. Liver unremarkable; no enhancing intrahepatic mass identified. No intra or extrahepatic biliary ductal dilation. Pancreas: Unremarkable Spleen: Unremarkable Adrenals/Urinary Tract: The adrenal glands are unremarkable. The kidneys  are normal in size and position. Mild bilateral nonobstructing nephrolithiasis is present multiple calculi measuring up to 6 mm within the mid and lower pole of the kidneys bilaterally. The kidneys are otherwise unremarkable. Bladder unremarkable. Stomach/Bowel: Stomach is within normal limits. Appendix appears normal. No evidence of bowel wall thickening, distention, or inflammatory changes. Vascular/Lymphatic: No significant vascular findings are present. No enlarged abdominal or pelvic lymph nodes. Reproductive: Prostate is unremarkable. Other: Numerous nodular and varicoid subcutaneous soft tissue nodules are identified within the subcutaneous gluteal tissues adjacent to the inter gluteal cleft of unclear significance the these may represent varicosities, subcutaneous collections such as sebaceous or epidermoid cysts, the sequela of subcutaneous injection of foreign body material, or venous varicosities. There is thickening of the dermal tissues along the intergluteal cleft, left greater than right, in keeping with superficial inflammatory processes could be seen with cellulitis. No loculated subcutaneous fluid collections are identified the inflammatory process remains external to the deep pelvis and perirectal soft tissues. Musculoskeletal: No acute bone abnormality. No lytic or  blastic bone lesion. IMPRESSION: 1. Thickening of the dermal tissues along the intergluteal cleft, left greater than right, in keeping with superficial inflammatory processes could be seen with cellulitis. No loculated subcutaneous fluid collections are identified. The inflammatory process remains external to the deep pelvis and perirectal soft tissues. 2. Numerous nodular and varicoid subcutaneous soft tissue nodules within the gluteal tissues adjacent to the intergluteal cleft of unclear significance. These may represent varicosities, subcutaneous collections such as sebaceous or epidermoid cysts, the sequela of subcutaneous injection of foreign body material, or venous varicosities. Correlation clinical examination is recommended. 3. Cholelithiasis. 4. Mild bilateral nonobstructing nephrolithiasis. 5. Enlarged central pulmonary arteries in keeping with changes of pulmonary arterial hypertension. Electronically Signed   By: Worthy Heads M.D.   On: 01/26/2024 02:45   DG Chest Port 1 View Result Date: 01/26/2024 CLINICAL DATA:  Question of sepsis to evaluate for abnormality. Fever and fatigue. EXAM: PORTABLE CHEST 1 VIEW COMPARISON:  06/10/2023 FINDINGS: Shallow inspiration. Mild cardiac enlargement. No vascular congestion, edema, or consolidation. No pleural effusion or pneumothorax. Mediastinal contours appear intact. Degenerative changes in the spine. IMPRESSION: Cardiac enlargement.  No evidence of active pulmonary disease. Electronically Signed   By: Boyce Byes M.D.   On: 01/26/2024 00:20    Microbiology: Results for orders placed or performed during the hospital encounter of 01/26/24  Blood Culture (routine x 2)     Status: None (Preliminary result)   Collection Time: 01/26/24 12:39 AM   Specimen: BLOOD  Result Value Ref Range Status   Specimen Description BLOOD RIGHT FA  Final   Special Requests   Final    BOTTLES DRAWN AEROBIC AND ANAEROBIC Blood Culture results may not be optimal due to  an inadequate volume of blood received in culture bottles   Culture   Final    NO GROWTH 4 DAYS Performed at [facility], [address] [place], [place] [address]    Report Status PENDING  Incomplete  Blood Culture (routine x 2)     Status: None (Preliminary result)   Collection Time: 01/26/24 12:40 AM   Specimen: BLOOD  Result Value Ref Range Status   Specimen Description BLOOD LEFT FA  Final   Special Requests   Final    BOTTLES DRAWN AEROBIC AND ANAEROBIC Blood Culture results may not be optimal due to an inadequate volume of blood received in culture bottles   Culture   Final    NO GROWTH 4  DAYS Performed at [facility], [address] [place], [place] [address]    Report Status PENDING  Incomplete    Labs: CBC: Recent Labs  Lab 01/26/24 0040 01/27/24 0910 01/30/24 0436  WBC 18.0* 10.1 7.2  NEUTROABS 16.7* 8.9*  --   HGB 13.4 11.5* 11.7*  HCT 43.1 37.9* 37.8*  MCV 85.9 87.3 84.6  PLT 239 174 198   Basic Metabolic Panel: Recent Labs  Lab 01/26/24 0040 01/27/24 0606 01/27/24 0910 01/29/24 0450 01/30/24 0436  NA 138  --  138  --  140  K 4.6  --  4.2  --  4.0  CL 104  --  108  --  109  CO2 25  --  24  --  26  GLUCOSE 145*  --  114*  --  108*  BUN 30*  --  22  --  20  CREATININE 1.46* 1.33* 1.37* 1.32* 1.19  CALCIUM  8.6*  --  8.1*  --  8.6*   Liver Function Tests: Recent Labs  Lab 01/26/24 0040  AST 19  ALT 9  ALKPHOS 49  BILITOT 0.8  PROT 6.4*  ALBUMIN  3.5   CBG: No results for input(s): "GLUCAP" in the last 168 hours.  Discharge time spent: greater than 30 minutes.  Signed: Read Camel, MD [facility] 01/30/2024

## 2024-01-31 LAB — CULTURE, BLOOD (ROUTINE X 2)
Culture: NO GROWTH
Culture: NO GROWTH

## 2024-06-15 ENCOUNTER — Inpatient Hospital Stay
Admission: EM | Admit: 2024-06-15 | Discharge: 2024-06-18 | DRG: 872 | Disposition: A | Attending: Obstetrics and Gynecology | Admitting: Obstetrics and Gynecology

## 2024-06-15 ENCOUNTER — Emergency Department

## 2024-06-15 ENCOUNTER — Other Ambulatory Visit: Payer: Self-pay

## 2024-06-15 DIAGNOSIS — R5381 Other malaise: Secondary | ICD-10-CM | POA: Diagnosis present

## 2024-06-15 DIAGNOSIS — A419 Sepsis, unspecified organism: Secondary | ICD-10-CM | POA: Diagnosis present

## 2024-06-15 DIAGNOSIS — J9611 Chronic respiratory failure with hypoxia: Secondary | ICD-10-CM | POA: Diagnosis present

## 2024-06-15 DIAGNOSIS — L03317 Cellulitis of buttock: Secondary | ICD-10-CM | POA: Diagnosis present

## 2024-06-15 DIAGNOSIS — E66813 Obesity, class 3: Secondary | ICD-10-CM | POA: Diagnosis present

## 2024-06-15 DIAGNOSIS — Z79890 Hormone replacement therapy (postmenopausal): Secondary | ICD-10-CM | POA: Diagnosis not present

## 2024-06-15 DIAGNOSIS — I480 Paroxysmal atrial fibrillation: Secondary | ICD-10-CM | POA: Diagnosis present

## 2024-06-15 DIAGNOSIS — E039 Hypothyroidism, unspecified: Secondary | ICD-10-CM | POA: Diagnosis present

## 2024-06-15 DIAGNOSIS — E785 Hyperlipidemia, unspecified: Secondary | ICD-10-CM | POA: Diagnosis present

## 2024-06-15 DIAGNOSIS — I9589 Other hypotension: Secondary | ICD-10-CM | POA: Diagnosis present

## 2024-06-15 DIAGNOSIS — G4730 Sleep apnea, unspecified: Secondary | ICD-10-CM | POA: Diagnosis present

## 2024-06-15 DIAGNOSIS — I5032 Chronic diastolic (congestive) heart failure: Secondary | ICD-10-CM | POA: Diagnosis present

## 2024-06-15 DIAGNOSIS — G4733 Obstructive sleep apnea (adult) (pediatric): Secondary | ICD-10-CM | POA: Diagnosis present

## 2024-06-15 DIAGNOSIS — Z8249 Family history of ischemic heart disease and other diseases of the circulatory system: Secondary | ICD-10-CM

## 2024-06-15 DIAGNOSIS — Z6841 Body Mass Index (BMI) 40.0 and over, adult: Secondary | ICD-10-CM | POA: Diagnosis not present

## 2024-06-15 DIAGNOSIS — L89152 Pressure ulcer of sacral region, stage 2: Secondary | ICD-10-CM | POA: Diagnosis present

## 2024-06-15 DIAGNOSIS — N184 Chronic kidney disease, stage 4 (severe): Secondary | ICD-10-CM | POA: Diagnosis present

## 2024-06-15 DIAGNOSIS — J449 Chronic obstructive pulmonary disease, unspecified: Secondary | ICD-10-CM | POA: Diagnosis present

## 2024-06-15 DIAGNOSIS — L03116 Cellulitis of left lower limb: Secondary | ICD-10-CM | POA: Diagnosis present

## 2024-06-15 DIAGNOSIS — L03119 Cellulitis of unspecified part of limb: Secondary | ICD-10-CM

## 2024-06-15 DIAGNOSIS — F32A Depression, unspecified: Secondary | ICD-10-CM | POA: Diagnosis present

## 2024-06-15 DIAGNOSIS — Z79899 Other long term (current) drug therapy: Secondary | ICD-10-CM

## 2024-06-15 DIAGNOSIS — Z1152 Encounter for screening for COVID-19: Secondary | ICD-10-CM

## 2024-06-15 DIAGNOSIS — Z7951 Long term (current) use of inhaled steroids: Secondary | ICD-10-CM

## 2024-06-15 DIAGNOSIS — Z7982 Long term (current) use of aspirin: Secondary | ICD-10-CM

## 2024-06-15 DIAGNOSIS — E872 Acidosis, unspecified: Secondary | ICD-10-CM | POA: Diagnosis present

## 2024-06-15 DIAGNOSIS — N1831 Chronic kidney disease, stage 3a: Secondary | ICD-10-CM | POA: Diagnosis present

## 2024-06-15 DIAGNOSIS — R6520 Severe sepsis without septic shock: Secondary | ICD-10-CM | POA: Diagnosis present

## 2024-06-15 DIAGNOSIS — I130 Hypertensive heart and chronic kidney disease with heart failure and stage 1 through stage 4 chronic kidney disease, or unspecified chronic kidney disease: Secondary | ICD-10-CM | POA: Diagnosis present

## 2024-06-15 DIAGNOSIS — I10 Essential (primary) hypertension: Secondary | ICD-10-CM | POA: Diagnosis present

## 2024-06-15 LAB — COMPREHENSIVE METABOLIC PANEL WITH GFR
ALT: 12 U/L (ref 0–44)
AST: 21 U/L (ref 15–41)
Albumin: 3.7 g/dL (ref 3.5–5.0)
Alkaline Phosphatase: 54 U/L (ref 38–126)
Anion gap: 13 (ref 5–15)
BUN: 37 mg/dL — ABNORMAL HIGH (ref 8–23)
CO2: 28 mmol/L (ref 22–32)
Calcium: 8.8 mg/dL — ABNORMAL LOW (ref 8.9–10.3)
Chloride: 100 mmol/L (ref 98–111)
Creatinine, Ser: 1.38 mg/dL — ABNORMAL HIGH (ref 0.61–1.24)
GFR, Estimated: 54 mL/min — ABNORMAL LOW (ref >60)
Glucose, Bld: 120 mg/dL — ABNORMAL HIGH (ref 70–99)
Potassium: 4.8 mmol/L (ref 3.5–5.1)
Sodium: 141 mmol/L (ref 135–145)
Total Bilirubin: 0.8 mg/dL (ref 0.0–1.2)
Total Protein: 7.7 g/dL (ref 6.5–8.1)

## 2024-06-15 LAB — URINALYSIS, W/ REFLEX TO CULTURE (INFECTION SUSPECTED)
Bacteria, UA: NONE SEEN
Bilirubin Urine: NEGATIVE
Glucose, UA: NEGATIVE mg/dL
Hgb urine dipstick: NEGATIVE
Ketones, ur: NEGATIVE mg/dL
Leukocytes,Ua: NEGATIVE
Nitrite: NEGATIVE
Protein, ur: NEGATIVE mg/dL
Specific Gravity, Urine: 1.018 (ref 1.005–1.030)
Squamous Epithelial / HPF: 0 /HPF (ref 0–5)
pH: 7 (ref 5.0–8.0)

## 2024-06-15 LAB — TROPONIN I (HIGH SENSITIVITY)
Troponin I (High Sensitivity): 6 ng/L (ref <18)
Troponin I (High Sensitivity): 7 ng/L (ref <18)

## 2024-06-15 LAB — CBC WITH DIFFERENTIAL/PLATELET
Abs Immature Granulocytes: 0.1 K/uL — ABNORMAL HIGH (ref 0.00–0.07)
Basophils Absolute: 0 K/uL (ref 0.0–0.1)
Basophils Relative: 0 %
Eosinophils Absolute: 0 K/uL (ref 0.0–0.5)
Eosinophils Relative: 0 %
HCT: 45.5 % (ref 39.0–52.0)
Hemoglobin: 13.8 g/dL (ref 13.0–17.0)
Immature Granulocytes: 1 %
Lymphocytes Relative: 3 %
Lymphs Abs: 0.6 K/uL — ABNORMAL LOW (ref 0.7–4.0)
MCH: 25.8 pg — ABNORMAL LOW (ref 26.0–34.0)
MCHC: 30.3 g/dL (ref 30.0–36.0)
MCV: 85.2 fL (ref 80.0–100.0)
Monocytes Absolute: 0.9 K/uL (ref 0.1–1.0)
Monocytes Relative: 5 %
Neutro Abs: 15 K/uL — ABNORMAL HIGH (ref 1.7–7.7)
Neutrophils Relative %: 91 %
Platelets: 229 K/uL (ref 150–400)
RBC: 5.34 MIL/uL (ref 4.22–5.81)
RDW: 15 % (ref 11.5–15.5)
WBC: 16.6 K/uL — ABNORMAL HIGH (ref 4.0–10.5)
nRBC: 0 % (ref 0.0–0.2)

## 2024-06-15 LAB — LACTIC ACID, PLASMA
Lactic Acid, Venous: 2.5 mmol/L (ref 0.5–1.9)
Lactic Acid, Venous: 3.6 mmol/L (ref 0.5–1.9)

## 2024-06-15 LAB — BLOOD GAS, VENOUS

## 2024-06-15 LAB — PROTIME-INR
INR: 1 (ref 0.8–1.2)
Prothrombin Time: 14 s (ref 11.4–15.2)

## 2024-06-15 LAB — RESP PANEL BY RT-PCR (RSV, FLU A&B, COVID)  RVPGX2
Influenza A by PCR: NEGATIVE
Influenza B by PCR: NEGATIVE
Resp Syncytial Virus by PCR: NEGATIVE
SARS Coronavirus 2 by RT PCR: NEGATIVE

## 2024-06-15 MED ORDER — ASPIRIN 81 MG PO TBEC: 81.0000 mg | DELAYED_RELEASE_TABLET | Freq: Every day | ORAL | Status: DC

## 2024-06-15 MED ORDER — SODIUM CHLORIDE 0.9 % IV SOLN
2.0000 g | Freq: Three times a day (TID) | INTRAVENOUS | Status: DC
  Administered 2024-06-15 – 2024-06-17 (×6): 2 g via INTRAVENOUS
  Filled 2024-06-15 (×7): qty 12.5

## 2024-06-15 MED ORDER — SODIUM CHLORIDE 0.9 % IV BOLUS
1000.0000 mL | Freq: Once | INTRAVENOUS | Status: AC
  Administered 2024-06-15: 1000 mL via INTRAVENOUS

## 2024-06-15 MED ORDER — HEPARIN SODIUM (PORCINE) 5000 UNIT/ML IJ SOLN
5000.0000 [IU] | Freq: Three times a day (TID) | INTRAMUSCULAR | Status: DC
  Administered 2024-06-15 – 2024-06-18 (×9): 5000 [IU] via SUBCUTANEOUS
  Filled 2024-06-15 (×8): qty 1

## 2024-06-15 MED ORDER — ACETAMINOPHEN 650 MG RE SUPP: 650.0000 mg | Freq: Four times a day (QID) | RECTAL | Status: DC | PRN

## 2024-06-15 MED ORDER — VITAMIN B-12 1000 MCG PO TABS
1000.0000 ug | ORAL_TABLET | ORAL | Status: DC
  Administered 2024-06-18: 1000 ug via ORAL
  Filled 2024-06-15: qty 1

## 2024-06-15 MED ORDER — SODIUM CHLORIDE 0.9% FLUSH
3.0000 mL | INTRAVENOUS | Status: DC | PRN
Start: 2024-06-15 — End: 2024-06-18

## 2024-06-15 MED ORDER — VANCOMYCIN HCL IN DEXTROSE 1-5 GM/200ML-% IV SOLN
1000.0000 mg | Freq: Once | INTRAVENOUS | Status: AC
  Administered 2024-06-15: 1000 mg via INTRAVENOUS
  Filled 2024-06-15: qty 200

## 2024-06-15 MED ORDER — SENNOSIDES-DOCUSATE SODIUM 8.6-50 MG PO TABS: 1.0000 | ORAL_TABLET | Freq: Two times a day (BID) | ORAL | Status: DC | PRN

## 2024-06-15 MED ORDER — VANCOMYCIN HCL 1500 MG/300ML IV SOLN
1500.0000 mg | Freq: Once | INTRAVENOUS | Status: AC
  Administered 2024-06-15: 1500 mg via INTRAVENOUS
  Filled 2024-06-15: qty 300

## 2024-06-15 MED ORDER — MIDODRINE HCL 5 MG PO TABS: 10.0000 mg | ORAL_TABLET | Freq: Once | ORAL | Status: DC

## 2024-06-15 MED ORDER — ACETAMINOPHEN 325 MG RE SUPP
650.0000 mg | Freq: Once | RECTAL | Status: AC
  Administered 2024-06-15: 650 mg via RECTAL
  Filled 2024-06-15: qty 2

## 2024-06-15 MED ORDER — POLYETHYLENE GLYCOL 3350 17 G PO PACK: 17.0000 g | PACK | Freq: Every day | ORAL | Status: DC | PRN

## 2024-06-15 MED ORDER — SODIUM CHLORIDE 0.9 % IV SOLN
2.0000 g | Freq: Once | INTRAVENOUS | Status: AC
  Administered 2024-06-15: 2 g via INTRAVENOUS
  Filled 2024-06-15: qty 12.5

## 2024-06-15 MED ORDER — ACETAMINOPHEN 325 MG PO TABS
650.0000 mg | ORAL_TABLET | Freq: Four times a day (QID) | ORAL | Status: DC | PRN
Start: 2024-06-15 — End: 2024-06-18

## 2024-06-15 MED ORDER — IOHEXOL 300 MG/ML  SOLN
100.0000 mL | Freq: Once | INTRAMUSCULAR | Status: AC | PRN
  Administered 2024-06-15: 100 mL via INTRAVENOUS

## 2024-06-15 MED ORDER — METRONIDAZOLE 500 MG/100ML IV SOLN
500.0000 mg | Freq: Once | INTRAVENOUS | Status: AC
  Administered 2024-06-15: 500 mg via INTRAVENOUS
  Filled 2024-06-15: qty 100

## 2024-06-15 MED ORDER — LEVOTHYROXINE SODIUM 88 MCG PO TABS
88.0000 ug | ORAL_TABLET | Freq: Every day | ORAL | Status: DC
  Administered 2024-06-16 – 2024-06-18 (×3): 88 ug via ORAL
  Filled 2024-06-15 (×3): qty 1

## 2024-06-15 MED ORDER — MIDODRINE HCL 5 MG PO TABS
10.0000 mg | ORAL_TABLET | Freq: Three times a day (TID) | ORAL | Status: DC
  Administered 2024-06-15 – 2024-06-18 (×9): 10 mg via ORAL
  Filled 2024-06-15 (×9): qty 2

## 2024-06-15 MED ORDER — HYDRALAZINE HCL 20 MG/ML IJ SOLN: 5.0000 mg | Freq: Four times a day (QID) | INTRAMUSCULAR | Status: DC | PRN

## 2024-06-15 MED ORDER — ONDANSETRON HCL 4 MG PO TABS: 4.0000 mg | ORAL_TABLET | Freq: Four times a day (QID) | ORAL | Status: DC | PRN

## 2024-06-15 MED ORDER — SODIUM CHLORIDE 0.9 % IV SOLN: INTRAVENOUS | Status: AC

## 2024-06-15 MED ORDER — ONDANSETRON HCL 4 MG/2ML IJ SOLN: 4.0000 mg | Freq: Four times a day (QID) | INTRAMUSCULAR | Status: DC | PRN

## 2024-06-15 MED ORDER — SERTRALINE HCL 50 MG PO TABS
50.0000 mg | ORAL_TABLET | Freq: Every day | ORAL | Status: DC
  Administered 2024-06-16 – 2024-06-18 (×3): 50 mg via ORAL
  Filled 2024-06-15 (×3): qty 1

## 2024-06-15 MED ORDER — SODIUM CHLORIDE 0.9 % IV SOLN: 250.0000 mL | INTRAVENOUS | Status: AC | PRN

## 2024-06-15 MED ORDER — SODIUM CHLORIDE 0.9% FLUSH
3.0000 mL | Freq: Two times a day (BID) | INTRAVENOUS | Status: DC
  Administered 2024-06-15 – 2024-06-18 (×6): 3 mL via INTRAVENOUS

## 2024-06-15 NOTE — ED Notes (Signed)
 This tech responded to call light where pt family stated pt need to be cleaned. This the changed pt sheet, chucks, and brief. Peri care was also provided.

## 2024-06-15 NOTE — ED Notes (Signed)
 Pt's relative called out because of tank on the oxygen tank on the back of the bed was empty. This tech hooked pt up on the back wall with oxygen and then got another tech to help me pull pt up in bed and also provided pt with another pillow.

## 2024-06-15 NOTE — Consult Note (Signed)
 Pharmacy Antibiotic Note  Christopher [name] is a 72 y.o. male admitted on 06/15/2024 with sepsis.  Pharmacy has been consulted for vancomycin  and cefepime  dosing.  Plan: Give vancomycin  2500 mg IV x 1, then start vancomycin  1750 mg IV every 24 hours Estimated AUC 479.8, Cmin 11.7 IBW, Scr 1.38, Vd 0.5 (BMI 45.6) Vancomycin  levels at steady state or as clinically indicated Start cefepime  2 grams IV every 8 hours Follow renal function and cultures for adjustments   Height: 6' (182.9 cm) Weight: (!) 152.5 kg (336 lb 4.8 oz) IBW/kg (Calculated) : 77.6  Temp (24hrs), Avg:101.6 F (38.7 C), Min:100.2 F (37.9 C), Max:103 F (39.4 C)  Recent Labs  Lab 06/15/24 1253 06/15/24 1545  WBC 16.6*  --   CREATININE 1.38*  --   LATICACIDVEN 2.5* 3.6*    Estimated Creatinine Clearance: 72.6 mL/min (A) (by C-G formula based on SCr of 1.38 mg/dL (H)).    Allergies  Allergen Reactions   Propranolol  Other (See Comments)    Hallucinations    Albuterol  Other (See Comments)    Causes afib    Gramineae Pollens Other (See Comments)    Sneeze and watery eyes Sneeze and watery eyes   Other Other (See Comments)    Other reaction(s): Other (See Comments) Unable to breath when around cats Other reaction(s): Other (See Comments) Unable to breath when around cats    Antimicrobials this admission: vancomycin  10/24 >>  cefepime  10/24 >>   Dose adjustments this admission: N/A  Microbiology results: 10/24 BCx: pending   Thank you for allowing pharmacy to be a part of this patient's care.  Kayla JULIANNA Blew, PharmD 06/15/2024 6:31 PM

## 2024-06-15 NOTE — ED Provider Notes (Signed)
-----------------------------------------   3:17 PM on 06/15/2024 -----------------------------------------  Blood pressure 132/60, pulse (!) 108, temperature (!) 103 F (39.4 C), temperature source Oral, resp. rate (!) 31, height 6' (1.829 m), weight (!) 152.5 kg, SpO2 100%.  Assuming care from Dr. Ernest.  In short, Christopher [name] is a 73 y.o. male with a chief complaint of Fever and Code Sepsis .  Refer to the original H&P for additional details.  The current plan of care is to follow-up CT imaging for evaluation of source of sepsis.  CT head is negative for acute process, CT chest/abdomen/pelvis is also unremarkable.  Patient denies any headache or neck stiffness, low suspicion for meningitis.  He did have recurrent redness over his right buttock per Dr. Bronson, suspect recurrent cellulitis.  Patient did have rising lactic acid, but BP remained stable, will give additional IV fluids and his home dose of midodrine .  Case discussed with hospitalist for admission.       Willo Dunnings, MD 06/15/24 [phone]

## 2024-06-15 NOTE — Sepsis Progress Note (Signed)
 Sepsis protocol monitored by eLink

## 2024-06-15 NOTE — ED Provider Notes (Signed)
 [facility] Provider Note    Event Date/Time   First MD Initiated Contact with Patient 06/15/24 1223     (approximate)   History   Fever   HPI  Christopher [name] is a 73 y.o. male with obesity, OSA on CPAP, hypertension, hyperlipidemia, COPD on 2 L who comes in with concerns for fever.  I reviewed the note from 01/26/2024 where patient was admitted for possible cellulitis of the sacral area.  CT imaging was done without any evidence of ulcers or fluid collections.  Patient really reportedly coming in from home with concerns for fever.  Patient with fever of 102.  No Tylenol  was given.  Patient does seem more confused than normal.  He is hard to get a good reliable history from.  No family at bedside.  Unable to get full HPI due to altered mental status    Physical Exam   Triage Vital Signs: ED Triage Vitals  Encounter Vitals Group     BP      Girls Systolic BP Percentile      Girls Diastolic BP Percentile      Boys Systolic BP Percentile      Boys Diastolic BP Percentile      Pulse      Resp      Temp      Temp src      SpO2      Weight      Height      Head Circumference      Peak Flow      Pain Score      Pain Loc      Pain Education      Exclude from Growth Chart     Most recent vital signs: Vitals:   06/15/24 1226 06/15/24 1230  BP: 115/64 128/64  Pulse: (!) 106 (!) 108  Resp: (!) 35 (!) 25  Temp: (!) 103 F (39.4 C)   SpO2: 100% 100%     General: Awake, no distress.  CV:  Good peripheral perfusion.  Tachycardic Resp:  Normal effort.  Increased respiratory rate Abd:  No distention.  Other:  Patient is alert and oriented x 2.  However attempting to roll patient to evaluate buttock.  Nothing obvious on left buttock right buttock does appear little bit red.  No obvious crepitus or abscesses, no obvious necrosis or redness of testicles.   ED Results / Procedures / Treatments   Labs (all labs ordered are listed, but only abnormal  results are displayed) Labs Reviewed  RESP PANEL BY RT-PCR (RSV, FLU A&B, COVID)  RVPGX2  CULTURE, BLOOD (ROUTINE X 2)  CULTURE, BLOOD (ROUTINE X 2)  RESP PANEL BY RT-PCR (RSV, FLU A&B, COVID)  RVPGX2  LACTIC ACID, PLASMA  LACTIC ACID, PLASMA  COMPREHENSIVE METABOLIC PANEL WITH GFR  CBC WITH DIFFERENTIAL/PLATELET  PROTIME-INR  URINALYSIS, W/ REFLEX TO CULTURE (INFECTION SUSPECTED)  BLOOD GAS, VENOUS  TROPONIN I (HIGH SENSITIVITY)     EKG  My interpretation of EKG:  Looks sinus with a rate of 105 without any ST elevation or T wave inversions, normal intervals  RADIOLOGY I have reviewed the xray personally and interpreted no evidence of any pneumonia   PROCEDURES:  Critical Care performed: Yes, see critical care procedure note(s)  .1-3 Lead EKG Interpretation  Performed by: Ernest Ronal BRAVO, MD Authorized by: Ernest Ronal BRAVO, MD     Interpretation: abnormal     ECG rate:  106   ECG rate  assessment: tachycardic     Rhythm: sinus tachycardia     Ectopy: none     Conduction: normal   .Critical Care  Performed by: Ernest Ronal BRAVO, MD Authorized by: Ernest Ronal BRAVO, MD   Critical care provider statement:    Critical care time (minutes):  30   Critical care was necessary to treat or prevent imminent or life-threatening deterioration of the following conditions:  Sepsis   Critical care was time spent personally by me on the following activities:  Development of treatment plan with patient or surrogate, discussions with consultants, evaluation of patient's response to treatment, examination of patient, ordering and review of laboratory studies, ordering and review of radiographic studies, ordering and performing treatments and interventions, pulse oximetry, re-evaluation of patient's condition and review of old charts    MEDICATIONS ORDERED IN ED: Medications  ceFEPIme  (MAXIPIME ) 2 g in sodium chloride  0.9 % 100 mL IVPB (has no administration in time range)  metroNIDAZOLE  (FLAGYL )  IVPB 500 mg (has no administration in time range)  vancomycin  (VANCOCIN ) IVPB 1000 mg/200 mL premix (has no administration in time range)  sodium chloride  0.9 % bolus 1,000 mL (has no administration in time range)  sodium chloride  0.9 % bolus 1,000 mL (has no administration in time range)  acetaminophen  (TYLENOL ) suppository 650 mg (has no administration in time range)     IMPRESSION / MDM / ASSESSMENT AND PLAN / ED COURSE  I reviewed the triage vital signs and the nursing notes.   Patient's presentation is most consistent with acute presentation with potential threat to life or bodily function.   Patient comes in with fever, sepsis.  Sepsis alert was called broad-spectrum antibiotics and fluids were initiated.  Starting with 2 L.  Family later was at bedside and they deny any known infectious symptoms just that he seemed more confused than normal.  They do report that he occasionally scratches at his buttocks and has some bleeding so not sure if they could got about an infection through that.  He has had buttock cellulitis in the past.  Patient pending CT imaging.  EKG has been read as atrial flutter but his EKG does seem to have sinus P waves.  This looks like more artifact.  COVID, flu are negative.  Lactate is elevated at 2.5.  Creatinine elevated at 1.38.  White count elevated at 16.  VBG without evidence of hypercapnia.  Blood pressures remain stable with 2 L ideal body weight.  Patient is currently in CT imaging.  Patient be handed off to oncoming team pending CT imaging, urine and admission for sepsis.  The patient is on the cardiac monitor to evaluate for evidence of arrhythmia and/or significant heart rate changes.      FINAL CLINICAL IMPRESSION(S) / ED DIAGNOSES   Final diagnoses:  Sepsis, due to unspecified organism, unspecified whether acute organ dysfunction present [facility])     Rx / DC Orders   ED Discharge Orders     None        Note:  This document was  prepared using Dragon voice recognition software and may include unintentional dictation errors.   Ernest Ronal BRAVO, MD 06/15/24 [phone]

## 2024-06-15 NOTE — Consult Note (Signed)
 CODE SEPSIS - PHARMACY COMMUNICATION  **Broad Spectrum Antibiotics should be administered within 1 hour of Sepsis diagnosis**  Time Code Sepsis Called/Page Received: 1232  Antibiotics Ordered: cefepime , metronidazole , vancomycin   Time of 1st antibiotic administration: 1307  Additional action taken by pharmacy: n/a  If necessary, Name of Provider/Nurse Contacted: n/a    Christopher [name] A Pegah Segel ,PharmD Clinical Pharmacist  06/15/2024  1:30 PM

## 2024-06-15 NOTE — Hospital Course (Addendum)
 Patient is a 73 years old male with past medical history of COPD on 2 L of oxygen, hyperlipidemia, hypertension, hypothyroidism, sleep apnea on CPAP, obesity,, congestive heart failure presented to hospital with complaints of fever and weakness.  Family noted that his temperature was as high as 102.5 F.  Patient was reported to be more confused than normal.  In the ED, patient was slightly tachycardic, tachypneic, febrile with a temperature of 103 F.  Labs were notable for leukocytosis with WBC at 16.6.  Venous blood gas showed low oxygen less than 31 but pH was within normal range.  BMP with creatinine elevation at 1.3 LFTs within normal limit.  Lactate was elevated at 2.5.  Influenza RSV and COVID was negative.  Urinalysis was negative for white cells..  Chest x-ray showed low lung volumes with no acute infiltrate.  CT head scan was negative for acute findings but diffuse cerebral parenchymal volume loss.  CT chest abdomen and pelvis was then performed which showed no acute findings except cholelithiasis without cholecystitis and bilateral nonobstructive nephrolithiasis.  Patient received cefepime  vancomycin  Flagyl  2 L of IV fluid bolus and Tylenol  in the ED and was admitted hospital for further evaluation and treatment   Severe sepsis.  Likely from cellulitis of the left buttocks, otherwise no other source of infection.  UA, chest x-ray CT scans negative.  Patient had signs of sepsis including tachycardia tachypnea fever with elevated lactate.  Will however do respiratory viral panel  Right buttocks cellulitis.  Had a history of cellulitis in the past.  History of COPD on 2 L of oxygen by nasal cannula.  No history of smoking quit  Obstructive sleep apnea on CPAP.  Will continue while in the hospital.  Hyperlipidemia On Lipitor at home.  Hold for now  Hypertension Was on midodrine  at home including torsemide .  Hold torsemide .  Received IV fluids today.  Hypothyroidism On Synthroid  at home.   Will continue  History of congestive heart failure.  On torsemide  at home including midodrine .  Hold torsemide  today.  Resume midodrine .
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
## 2024-06-16 ENCOUNTER — Inpatient Hospital Stay

## 2024-06-16 DIAGNOSIS — A419 Sepsis, unspecified organism: Secondary | ICD-10-CM

## 2024-06-16 DIAGNOSIS — N1831 Chronic kidney disease, stage 3a: Secondary | ICD-10-CM | POA: Insufficient documentation

## 2024-06-16 LAB — RESPIRATORY PANEL BY PCR

## 2024-06-16 LAB — BASIC METABOLIC PANEL WITH GFR
Anion gap: 8 (ref 5–15)
BUN: 36 mg/dL — ABNORMAL HIGH (ref 8–23)
CO2: 25 mmol/L (ref 22–32)
Calcium: 8 mg/dL — ABNORMAL LOW (ref 8.9–10.3)
Chloride: 106 mmol/L (ref 98–111)
Creatinine, Ser: 1.41 mg/dL — ABNORMAL HIGH (ref 0.61–1.24)
GFR, Estimated: 53 mL/min — ABNORMAL LOW (ref >60)
Glucose, Bld: 107 mg/dL — ABNORMAL HIGH (ref 70–99)
Potassium: 4.2 mmol/L (ref 3.5–5.1)
Sodium: 139 mmol/L (ref 135–145)

## 2024-06-16 LAB — TSH: TSH: 2.11 u[IU]/mL (ref 0.350–4.500)

## 2024-06-16 LAB — MAGNESIUM: Magnesium: 1.7 mg/dL (ref 1.7–2.4)

## 2024-06-16 LAB — CBC
HCT: 36.6 % — ABNORMAL LOW (ref 39.0–52.0)
Hemoglobin: 11.2 g/dL — ABNORMAL LOW (ref 13.0–17.0)
MCH: 26.2 pg (ref 26.0–34.0)
MCHC: 30.6 g/dL (ref 30.0–36.0)
MCV: 85.7 fL (ref 80.0–100.0)
Platelets: 192 K/uL (ref 150–400)
RBC: 4.27 MIL/uL (ref 4.22–5.81)
RDW: 15.4 % (ref 11.5–15.5)
WBC: 19.5 K/uL — ABNORMAL HIGH (ref 4.0–10.5)
nRBC: 0 % (ref 0.0–0.2)

## 2024-06-16 LAB — D-DIMER, QUANTITATIVE: D-Dimer, Quant: 2.61 ug{FEU}/mL — ABNORMAL HIGH (ref 0.00–0.50)

## 2024-06-16 LAB — BLOOD GAS, VENOUS
Acid-Base Excess: 6.8 mmol/L — ABNORMAL HIGH (ref 0.0–2.0)
Bicarbonate: 33.7 mmol/L — ABNORMAL HIGH (ref 20.0–28.0)
O2 Saturation: 49.1 %
Patient temperature: 37
pCO2, Ven: 57 mmHg (ref 44–60)
pH, Ven: 7.38 (ref 7.25–7.43)

## 2024-06-16 MED ORDER — LEVALBUTEROL HCL 1.25 MG/0.5ML IN NEBU: 1.2500 mg | INHALATION_SOLUTION | RESPIRATORY_TRACT | Status: DC | PRN

## 2024-06-16 MED ORDER — GUAIFENESIN-DM 100-10 MG/5ML PO SYRP: 5.0000 mL | ORAL_SOLUTION | ORAL | Status: DC | PRN

## 2024-06-16 MED ORDER — BUPROPION HCL ER (SR) 150 MG PO TB12
150.0000 mg | ORAL_TABLET | Freq: Two times a day (BID) | ORAL | Status: DC
  Administered 2024-06-16 – 2024-06-18 (×5): 150 mg via ORAL
  Filled 2024-06-16 (×5): qty 1

## 2024-06-16 MED ORDER — MELATONIN 5 MG PO TABS
2.5000 mg | ORAL_TABLET | Freq: Every day | ORAL | Status: DC
  Administered 2024-06-16 – 2024-06-17 (×2): 2.5 mg via ORAL
  Filled 2024-06-16 (×2): qty 1

## 2024-06-16 MED ORDER — VANCOMYCIN HCL 1750 MG/350ML IV SOLN
1750.0000 mg | INTRAVENOUS | Status: DC
  Administered 2024-06-16: 1750 mg via INTRAVENOUS
  Filled 2024-06-16 (×2): qty 350

## 2024-06-16 NOTE — Plan of Care (Signed)

## 2024-06-16 NOTE — Consult Note (Addendum)
 WOC Nurse Consult Note: Reason for Consult: Requested to assess sacrum injury. Wound type: DTI on sacrum and ischium. Pressure Injury POA: Yes Measurement: sacrum to bilateral buttock aprox. 15 cm x 10 cm of purple skin intact, compatible to a DTI. Ischium open wound aprox. 3.5 cm x 2.5 cm, wound bed cover by dry scab. Drainage (amount, consistency, odor) None. Periwound: intact. Dressing procedure/placement/frequency: Cleanse with warm water and wash cloth, pat dry. Apply foam dressing to the areas, change every 3 days or PRN. Reassess every shift.  - Turn and repositioning per hospital policy - If the pt is able to go in the chair, add a chair pressure retribution pad.   WOC team will not plan to follow further. Please reconsult if further assistance is needed. Thank-you,  Lela Holm RN, CNS, [employer], MSN.  (Phone [phone])
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
## 2024-06-17 ENCOUNTER — Inpatient Hospital Stay

## 2024-06-17 DIAGNOSIS — A419 Sepsis, unspecified organism: Secondary | ICD-10-CM | POA: Diagnosis not present

## 2024-06-17 LAB — BLOOD CULTURE ID PANEL (REFLEXED) - BCID2

## 2024-06-17 LAB — CBC
HCT: 36.8 % — ABNORMAL LOW (ref 39.0–52.0)
Hemoglobin: 11 g/dL — ABNORMAL LOW (ref 13.0–17.0)
MCH: 26.1 pg (ref 26.0–34.0)
MCHC: 29.9 g/dL — ABNORMAL LOW (ref 30.0–36.0)
MCV: 87.4 fL (ref 80.0–100.0)
Platelets: 184 K/uL (ref 150–400)
RBC: 4.21 MIL/uL — ABNORMAL LOW (ref 4.22–5.81)
RDW: 15.4 % (ref 11.5–15.5)
WBC: 12.4 K/uL — ABNORMAL HIGH (ref 4.0–10.5)
nRBC: 0 % (ref 0.0–0.2)

## 2024-06-17 LAB — BASIC METABOLIC PANEL WITH GFR
Anion gap: 5 (ref 5–15)
BUN: 32 mg/dL — ABNORMAL HIGH (ref 8–23)
CO2: 26 mmol/L (ref 22–32)
Calcium: 8.1 mg/dL — ABNORMAL LOW (ref 8.9–10.3)
Chloride: 108 mmol/L (ref 98–111)
Creatinine, Ser: 1.29 mg/dL — ABNORMAL HIGH (ref 0.61–1.24)
GFR, Estimated: 59 mL/min — ABNORMAL LOW (ref >60)
Glucose, Bld: 93 mg/dL (ref 70–99)
Potassium: 4.2 mmol/L (ref 3.5–5.1)
Sodium: 139 mmol/L (ref 135–145)

## 2024-06-17 LAB — LACTIC ACID, PLASMA: Lactic Acid, Venous: 0.7 mmol/L (ref 0.5–1.9)

## 2024-06-17 MED ORDER — CEFAZOLIN SODIUM-DEXTROSE 2-4 GM/100ML-% IV SOLN
2.0000 g | Freq: Three times a day (TID) | INTRAVENOUS | Status: DC
Start: 2024-06-17 — End: 2024-06-18
  Administered 2024-06-17 – 2024-06-18 (×2): 2 g via INTRAVENOUS
  Filled 2024-06-17 (×4): qty 100

## 2024-06-17 MED ORDER — GADOBUTROL 1 MMOL/ML IV SOLN
10.0000 mL | Freq: Once | INTRAVENOUS | Status: AC | PRN
  Administered 2024-06-17: 10 mL via INTRAVENOUS

## 2024-06-17 NOTE — Plan of Care (Signed)
  Problem: Education: Goal: Knowledge of General Education information will improve Description: Including pain rating scale, medication(s)/side effects and non-pharmacologic comfort measures Outcome: Progressing   Problem: Health Behavior/Discharge Planning: Goal: Ability to manage health-related needs will improve Outcome: Not Progressing   Problem: Clinical Measurements: Goal: Ability to maintain clinical measurements within normal limits will improve Outcome: Progressing Goal: Will remain free from infection Outcome: Progressing Goal: Diagnostic test results will improve Outcome: Progressing Goal: Respiratory complications will improve Outcome: Progressing Goal: Cardiovascular complication will be avoided Outcome: Progressing   Problem: Coping: Goal: Level of anxiety will decrease Outcome: Progressing   Problem: Elimination: Goal: Will not experience complications related to bowel motility Outcome: Progressing Goal: Will not experience complications related to urinary retention Outcome: Progressing   Problem: Pain Managment: Goal: General experience of comfort will improve and/or be controlled Outcome: Progressing   Problem: Safety: Goal: Ability to remain free from injury will improve Outcome: Progressing   Problem: Skin Integrity: Goal: Risk for impaired skin integrity will decrease Outcome: Progressing

## 2024-06-17 NOTE — Plan of Care (Signed)
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
## 2024-06-17 NOTE — Progress Notes (Signed)
 PHARMACY - PHYSICIAN COMMUNICATION CRITICAL VALUE ALERT - BLOOD CULTURE IDENTIFICATION (BCID)  Christopher [name] is an 73 y.o. male who presented to [facility] on 06/15/2024 with a chief complaint of sepsis.   Assessment:  Staph species growing in 1 of 4 bottles, most likely a contaminant  (include suspected source if known)  Name of physician (or Provider) Contacted: [place]   Current antibiotics: Vanc, Cefepime    Changes to prescribed antibiotics recommended:  Patient is on recommended antibiotics - No changes needed  No results found for this or any previous visit.  Faria Casella D 06/17/2024  2:16 AM

## 2024-06-18 ENCOUNTER — Other Ambulatory Visit: Payer: Self-pay

## 2024-06-18 DIAGNOSIS — L03119 Cellulitis of unspecified part of limb: Secondary | ICD-10-CM

## 2024-06-18 DIAGNOSIS — L03116 Cellulitis of left lower limb: Secondary | ICD-10-CM

## 2024-06-18 LAB — BASIC METABOLIC PANEL WITH GFR
Anion gap: 5 (ref 5–15)
BUN: 30 mg/dL — ABNORMAL HIGH (ref 8–23)
CO2: 25 mmol/L (ref 22–32)
Calcium: 8.3 mg/dL — ABNORMAL LOW (ref 8.9–10.3)
Chloride: 107 mmol/L (ref 98–111)
Creatinine, Ser: 1.36 mg/dL — ABNORMAL HIGH (ref 0.61–1.24)
GFR, Estimated: 55 mL/min — ABNORMAL LOW (ref >60)
Glucose, Bld: 90 mg/dL (ref 70–99)
Potassium: 4.2 mmol/L (ref 3.5–5.1)
Sodium: 137 mmol/L (ref 135–145)

## 2024-06-18 LAB — CBC
HCT: 37.2 % — ABNORMAL LOW (ref 39.0–52.0)
Hemoglobin: 11.4 g/dL — ABNORMAL LOW (ref 13.0–17.0)
MCH: 26.6 pg (ref 26.0–34.0)
MCHC: 30.6 g/dL (ref 30.0–36.0)
MCV: 86.7 fL (ref 80.0–100.0)
Platelets: 183 K/uL (ref 150–400)
RBC: 4.29 MIL/uL (ref 4.22–5.81)
RDW: 15.4 % (ref 11.5–15.5)
WBC: 7.1 K/uL (ref 4.0–10.5)
nRBC: 0 % (ref 0.0–0.2)

## 2024-06-18 MED ORDER — ONDANSETRON 4 MG PO TBDP
4.0000 mg | ORAL_TABLET | Freq: Three times a day (TID) | ORAL | 0 refills | Status: AC | PRN
  Filled 2024-06-18: qty 10, 4d supply, fill #0

## 2024-06-18 MED ORDER — CEFADROXIL 500 MG PO CAPS
500.0000 mg | ORAL_CAPSULE | Freq: Two times a day (BID) | ORAL | 0 refills | Status: AC
  Filled 2024-06-18: qty 10, 5d supply, fill #0

## 2024-06-18 MED ORDER — LORATADINE 10 MG PO TABS
10.0000 mg | ORAL_TABLET | Freq: Every day | ORAL | Status: DC
  Administered 2024-06-18: 10 mg via ORAL
  Filled 2024-06-18: qty 1

## 2024-06-18 NOTE — TOC Initial Note (Signed)
 Transition of Care [facility]) - Initial/Assessment Note    Patient Details  Name: Christopher [name] MRN: [id number] Date of Birth: [birth year]  Transition of Care [facility]) CM/SW Contact:    Christopher ONEIDA Haddock, RN Phone Number: 06/18/2024, 2:22 PM  Clinical Narrative:                  Patient to dc today Daughter Christopher [name] at bedside Admitted for: sepsis Admitted from: home with daughter PCP: Christopher [name]  Current home health/prior home health/DME: CPAP through [employer], nocturnal O2 through [employer], WC, RW, BSC, and shower chair  MD has ordered home health for dc.  Patient and Daughter in agreement and states she would like to use [facility].  Accepted in HUB and notified Christopher [name] of dc        Patient Goals and CMS Choice            Expected Discharge Plan and Services         Expected Discharge Date: 06/18/24                                    Prior Living Arrangements/Services                       Activities of Daily Living   ADL Screening (condition at time of admission) Independently performs ADLs?: No Does the patient have a NEW difficulty with bathing/dressing/toileting/self-feeding that is expected to last >3 days?: No Does the patient have a NEW difficulty with getting in/out of bed, walking, or climbing stairs that is expected to last >3 days?: No Does the patient have a NEW difficulty with communication that is expected to last >3 days?: No Is the patient deaf or have difficulty hearing?: No Does the patient have difficulty seeing, even when wearing glasses/contacts?: No Does the patient have difficulty concentrating, remembering, or making decisions?: Yes  Permission Sought/Granted                  Emotional Assessment              Admission diagnosis:  Sepsis (HCC) [A41.9] Sepsis, due to unspecified organism, unspecified whether acute organ dysfunction present [facility]) [A41.9] Patient Active Problem List   Diagnosis Date Noted    Lower extremity cellulitis 06/18/2024   CKD stage 3a, GFR 45-59 ml/min (HCC) 06/16/2024   Sepsis (HCC) 06/15/2024   Decubitus ulcer of sacral region, stage 2 (HCC) 01/30/2024   Sepsis due to cellulitis (HCC) 06/11/2023   Paroxysmal atrial fibrillation (HCC) 06/11/2023   Closed left subtrochanteric femur fracture, initial encounter (HCC) 01/19/2023   Generalized weakness 10/16/2021   Chronic respiratory failure with hypoxia (HCC) 10/16/2021   Hypotension    SIRS (systemic inflammatory response syndrome) (HCC)    AF (paroxysmal atrial fibrillation) (HCC)    Syncope    Severe sepsis (HCC) 05/20/2021   Pressure injury of skin 04/25/2021   Acute respiratory failure (HCC) 04/21/2021   Acute kidney injury superimposed on CKD 04/21/2021   Stage 3b chronic kidney disease (CKD) (HCC) 03/22/2021   Leukocytosis 03/22/2021   Osteomyelitis of ankle or foot, acute, left (HCC) 03/17/2021   Acute on chronic respiratory failure (HCC) 03/17/2021   Osteomyelitis (HCC) 12/16/2020   Depression 12/16/2020   Medicare annual wellness visit, initial 11/14/2019   B12 deficiency 11/14/2019   Hyperlipidemia 10/31/2019   Primary osteoarthritis of right knee 03/16/2019   Sleep apnea  10/31/2018   Traumatic incomplete tear of left rotator cuff 10/02/2018   Morbid obesity with BMI of 45.0-49.9, adult (HCC) 09/20/2018   Hypothyroidism 09/20/2018   Chronic diastolic CHF (congestive heart failure) (HCC) 09/20/2018   Benign essential hypertension 09/20/2018   PCP:  Christopher Christopher FALCON, MD Pharmacy:   [id number] [place], [place] - [address] AT [facility] OF [address] [place] [address] Phone: [phone] Fax: [phone]  [facility] [address] [place] [address] Phone: [phone] Fax: [phone]     Social Drivers of Health (SDOH) Social History: SDOH Screenings   Food Insecurity: No Food  Insecurity (06/16/2024)  Housing: Low Risk  (06/16/2024)  Transportation Needs: No Transportation Needs (06/16/2024)  Utilities: Not At Risk (06/16/2024)  Depression (PHQ2-9): Low Risk  (10/20/2022)  Social Connections: Socially Isolated (06/16/2024)  Tobacco Use: Low Risk  (06/15/2024)   SDOH Interventions:     Readmission Risk Interventions     No data to display

## 2024-06-18 NOTE — Discharge Summary (Addendum)
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 seriousbroker.it  This test is not yet approved or cleared by the [place]  FDA and has been authorized for detection and/or diagnosis of SARS-CoV-2 by FDA under an Emergency Use Authorization (EUA). This EUA will remain in effect (meaning this test can be used) for the duration of the COVID-19 declaration under Section 564(b)(1) of the Act, 21 U.S.C. section 360bbb-3(b)(1), unless the authorization is terminated or revoked.  Performed at [facility], [address] [place], [place] [address]   Blood Culture ID Panel (Reflexed)     Status: Abnormal   Collection Time: 06/15/24 12:53 PM  Result Value Ref Range Status   Enterococcus faecalis NOT DETECTED NOT DETECTED Final   Enterococcus Faecium NOT DETECTED NOT DETECTED Final    Listeria monocytogenes NOT DETECTED NOT DETECTED Final   Staphylococcus species DETECTED (A) NOT DETECTED Final    Comment: JASON ROBBINS, PHARMD @0207  06/17/2024 COP   Staphylococcus aureus (BCID) NOT DETECTED NOT DETECTED Final   Staphylococcus epidermidis NOT DETECTED NOT DETECTED Final   Staphylococcus lugdunensis NOT DETECTED NOT DETECTED Final   Streptococcus species NOT DETECTED NOT DETECTED Final   Streptococcus agalactiae NOT DETECTED NOT DETECTED Final   Streptococcus pneumoniae NOT DETECTED NOT DETECTED Final   Streptococcus pyogenes NOT DETECTED NOT DETECTED Final   A.calcoaceticus-baumannii NOT DETECTED NOT DETECTED Final   Bacteroides fragilis NOT DETECTED NOT DETECTED Final   Enterobacterales NOT DETECTED NOT DETECTED Final   Enterobacter cloacae complex NOT DETECTED NOT DETECTED Final  Escherichia coli NOT DETECTED NOT DETECTED Final   Klebsiella aerogenes NOT DETECTED NOT DETECTED Final   Klebsiella oxytoca NOT DETECTED NOT DETECTED Final   Klebsiella pneumoniae NOT DETECTED NOT DETECTED Final   Proteus species NOT DETECTED NOT DETECTED Final   Salmonella species NOT DETECTED NOT DETECTED Final   Serratia marcescens NOT DETECTED NOT DETECTED Final   Haemophilus influenzae NOT DETECTED NOT DETECTED Final   Neisseria meningitidis NOT DETECTED NOT DETECTED Final   Pseudomonas aeruginosa NOT DETECTED NOT DETECTED Final   Stenotrophomonas maltophilia NOT DETECTED NOT DETECTED Final   Candida albicans NOT DETECTED NOT DETECTED Final   Candida auris NOT DETECTED NOT DETECTED Final   Candida glabrata NOT DETECTED NOT DETECTED Final   Candida krusei NOT DETECTED NOT DETECTED Final   Candida parapsilosis NOT DETECTED NOT DETECTED Final   Candida tropicalis NOT DETECTED NOT DETECTED Final   Cryptococcus neoformans/gattii NOT DETECTED NOT DETECTED Final    Comment: Performed at [facility], [address] [place], [place] [address]  Respiratory (~20 pathogens) panel  by PCR     Status: None   Collection Time: 06/16/24 11:15 AM   Specimen: Nasopharyngeal Swab; Respiratory  Result Value Ref Range Status   Adenovirus NOT DETECTED NOT DETECTED Final   Coronavirus 229E NOT DETECTED NOT DETECTED Final    Comment: (NOTE) The Coronavirus on the Respiratory Panel, DOES NOT test for the novel  Coronavirus (2019 nCoV)    Coronavirus HKU1 NOT DETECTED NOT DETECTED Final   Coronavirus NL63 NOT DETECTED NOT DETECTED Final   Coronavirus OC43 NOT DETECTED NOT DETECTED Final   Metapneumovirus NOT DETECTED NOT DETECTED Final   Rhinovirus / Enterovirus NOT DETECTED NOT DETECTED Final   Influenza A NOT DETECTED NOT DETECTED Final   Influenza B NOT DETECTED NOT DETECTED Final   Parainfluenza Virus 1 NOT DETECTED NOT DETECTED Final   Parainfluenza Virus 2 NOT DETECTED NOT DETECTED Final   Parainfluenza Virus 3 NOT DETECTED NOT DETECTED Final   Parainfluenza Virus 4 NOT DETECTED NOT DETECTED Final   Respiratory Syncytial Virus NOT DETECTED NOT DETECTED Final   Bordetella pertussis NOT DETECTED NOT DETECTED Final   Bordetella Parapertussis NOT DETECTED NOT DETECTED Final   Chlamydophila pneumoniae NOT DETECTED NOT DETECTED Final   Mycoplasma pneumoniae NOT DETECTED NOT DETECTED Final    Comment: Performed at [facility], [address], [place], [place] [address]     Labs: Basic Metabolic Panel: Recent Labs  Lab 06/15/24 1253 06/16/24 0655 06/17/24 0541 06/18/24 0447  NA 141 139 139 137  K 4.8 4.2 4.2 4.2  CL 100 106 108 107  CO2 28 25 26 25   GLUCOSE 120* 107* 93 90  BUN 37* 36* 32* 30*  CREATININE 1.38* 1.41* 1.29* 1.36*  CALCIUM  8.8* 8.0* 8.1* 8.3*  MG  --  1.7  --   --    Liver Function Tests: Recent Labs  Lab 06/15/24 1253  AST 21  ALT 12  ALKPHOS 54  BILITOT 0.8  PROT 7.7  ALBUMIN  3.7   No results for input(s): LIPASE, AMYLASE in the last 168 hours. No results for input(s): AMMONIA in the last 168 hours. CBC: Recent Labs   Lab 06/15/24 1253 06/16/24 0655 06/17/24 0541 06/18/24 0447  WBC 16.6* 19.5* 12.4* 7.1  NEUTROABS 15.0*  --   --   --   HGB 13.8 11.2* 11.0* 11.4*  HCT 45.5 36.6* 36.8* 37.2*  MCV 85.2 85.7 87.4 86.7  PLT 229 192  184 183   Cardiac Enzymes: No results for input(s): CKTOTAL, CKMB, CKMBINDEX, TROPONINI in the last 168 hours. BNP: BNP (last 3 results) No results for input(s): BNP in the last 8760 hours.  ProBNP (last 3 results) No results for input(s): PROBNP in the last 8760 hours.  CBG: No results for input(s): GLUCAP in the last 168 hours.     Signed:  Devaughn KATHEE Ban MD.  [facility] 06/18/2024, 1:32 PM

## 2024-06-18 NOTE — Care Management Important Message (Signed)
 Important Message  Patient Details  Name: Christopher [name] MRN: [id number] Date of Birth: 12/24/1950   Important Message Given:  Yes - Medicare IM     [name] 06/18/2024, 4:38 PM

## 2024-06-18 NOTE — Progress Notes (Deleted)
 Interim progress note not for billing  I have seen and examined the patient, reviewed the chart, and agree with the assessment and plan. Will update TTE, plan is additional dialysis session tomorrow, possible discharge after. Poor compliance with fluid restriction thought to be main contributor here.

## 2024-06-18 NOTE — Plan of Care (Signed)
  Problem: Education: Goal: Knowledge of General Education information will improve Description: Including pain rating scale, medication(s)/side effects and non-pharmacologic comfort measures Outcome: Progressing   Problem: Health Behavior/Discharge Planning: Goal: Ability to manage health-related needs will improve Outcome: Not Progressing   Problem: Clinical Measurements: Goal: Ability to maintain clinical measurements within normal limits will improve Outcome: Progressing Goal: Will remain free from infection Outcome: Progressing Goal: Diagnostic test results will improve Outcome: Progressing Goal: Respiratory complications will improve Outcome: Progressing Goal: Cardiovascular complication will be avoided Outcome: Progressing   Problem: Nutrition: Goal: Adequate nutrition will be maintained Outcome: Progressing   Problem: Coping: Goal: Level of anxiety will decrease Outcome: Progressing   Problem: Elimination: Goal: Will not experience complications related to bowel motility Outcome: Progressing Goal: Will not experience complications related to urinary retention Outcome: Progressing   Problem: Safety: Goal: Ability to remain free from injury will improve Outcome: Progressing   Problem: Skin Integrity: Goal: Risk for impaired skin integrity will decrease Outcome: Progressing   Problem: Clinical Measurements: Goal: Ability to avoid or minimize complications of infection will improve Outcome: Progressing   Problem: Skin Integrity: Goal: Skin integrity will improve Outcome: Progressing

## 2024-06-18 NOTE — Evaluation (Signed)
 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  Family noted that his temperature was as high as 102.5 F.  Patient was reported to be more confused than normal.  At baseline patient is mostly sedentary and lives with his daughter at home.  Patient denies any pain nausea vomiting abdominal distention or diarrhea.  Denies any urinary urgency, frequency or dysuria.  Patient however complains of pain in the buttocks and  usually in the recliner at home.  Patient has some cough and wheezing but unchanged and uses inhalers at home.  He is supposed to be on CPAP but has not been using it and uses 2 L of oxygen at nighttime.  There is no mention of the recent sick contacts or recent travel.  Clinical Impression  Pt is a pleasant 73 year old male who was admitted for sepsis. Pt performs bed mobility, transfers, and ambulation with CGA and use of BRW. Pt is hopeful to dc home this date, however concerned as he has not been OOB since admission 3 days ago. Pt demonstrates deficits with strength/mobility/endurance. Would benefit from skilled PT to address above deficits and promote optimal return to PLOF. Pt will continue to receive skilled PT services while admitted and will defer to TOC/care team for updates regarding disposition planning.        If plan is discharge home, recommend the following: A little help with walking and/or transfers;Help with stairs or ramp for entrance   Can travel by private vehicle        Equipment Recommendations None recommended by PT  Recommendations for Other Services       Functional Status Assessment Patient has had a  recent decline in their functional status and demonstrates the ability to make significant improvements in function in a reasonable and predictable amount of time.     Precautions / Restrictions Precautions Precautions: Fall Recall of Precautions/Restrictions: Intact Restrictions Weight Bearing Restrictions Per Provider Order: No      Mobility  Bed Mobility Overal bed mobility: Needs Assistance Bed Mobility: Supine to Sit     Supine to sit: Contact guard     General bed mobility comments: takes extended time and needs cues for technique.    Transfers Overall transfer level: Needs assistance Equipment used:  (BRW) Transfers: Sit to/from Stand Sit to Stand: Contact guard assist           General transfer comment: does require elevated bed. All mobility performed on RA with sats WNL.    Ambulation/Gait Ambulation/Gait assistance: Contact guard assist Gait Distance (Feet): 4 Feet Assistive device:  (BRW) Gait Pattern/deviations: Step-to pattern       General Gait Details: able to take side steps up towards HOB. Heavy use of B UE on RW  Stairs            Wheelchair Mobility     Tilt Bed    Modified Rankin (Stroke Patients Only)       Balance Overall balance assessment: Needs assistance Sitting-balance support: No upper extremity supported, Feet supported Sitting balance-Leahy Scale: Good     Standing balance support: Bilateral upper extremity supported Standing balance-Leahy Scale: Fair  Pertinent Vitals/Pain Pain Assessment Pain Assessment: Faces Faces Pain Scale: Hurts a little bit Pain Location: B feet Pain Descriptors / Indicators: Aching Pain Intervention(s): Limited activity within patient's tolerance, Repositioned    Home Living Family/patient expects to be discharged to:: Private residence Living Arrangements: Children Available Help at Discharge: Family;Available 24 hours/day Type of  Home: House Home Access: Stairs to enter Entrance Stairs-Rails: Can reach both Entrance Stairs-Number of Steps: 2   Home Layout: Able to live on main level with bedroom/bathroom Home Equipment: Agricultural Consultant (2 wheels);Rollator (4 wheels);Wheelchair - manual;Grab bars - tub/shower;Lift chair;Shower seat (adjustable bed) Additional Comments: sleeps and sits in lift chair    Prior Function Prior Level of Function : Needs assist             Mobility Comments: uses WC for primary mobility, is able to SPT with RW. Sits in recliner chair 24/7. no recent falls since May 2024 ADLs Comments: daughter helps with getting in/out of shower and donning socks/shoes     Extremity/Trunk Assessment   Upper Extremity Assessment Upper Extremity Assessment: Overall WFL for tasks assessed    Lower Extremity Assessment Lower Extremity Assessment: Generalized weakness (B LE grossly 3+/5)       Communication   Communication Communication: No apparent difficulties    Cognition Arousal: Alert Behavior During Therapy: WFL for tasks assessed/performed   PT - Cognitive impairments: No apparent impairments                       PT - Cognition Comments: pleasant and agreeable to session Following commands: Intact       Cueing Cueing Techniques: Verbal cues     General Comments      Exercises Other Exercises Other Exercises: able to practice sit<>Stand 4-5 reps from elevated surface. Cues for terminal knee extension and upright posture   Assessment/Plan    PT Assessment Patient needs continued PT services  PT Problem List Decreased balance;Decreased mobility;Decreased strength;Decreased knowledge of use of DME       PT Treatment Interventions DME instruction;Gait training;Therapeutic exercise;Balance training    PT Goals (Current goals can be found in the Care Plan section)  Acute Rehab PT Goals Patient Stated Goal: to go home PT Goal Formulation: With patient Time For  Goal Achievement: 07/02/24 Potential to Achieve Goals: Good    Frequency Min 1X/week     Co-evaluation               AM-PAC PT 6 Clicks Mobility  Outcome Measure Help needed turning from your back to your side while in a flat bed without using bedrails?: A Little Help needed moving from lying on your back to sitting on the side of a flat bed without using bedrails?: A Little Help needed moving to and from a bed to a chair (including a wheelchair)?: A Little Help needed standing up from a chair using your arms (e.g., wheelchair or bedside chair)?: A Little Help needed to walk in hospital room?: A Lot Help needed climbing 3-5 steps with a railing? : Total 6 Click Score: 15    End of Session   Activity Tolerance: Patient tolerated treatment well Patient left: in bed;with family/visitor present Nurse Communication: Mobility status PT Visit Diagnosis: Muscle weakness (generalized) (M62.81);Difficulty in walking, not elsewhere classified (R26.2);Unsteadiness on feet (R26.81)    Time: 8665-8598 PT Time Calculation (min) (ACUTE ONLY): 27 min   Charges:   PT Evaluation $PT Eval Low Complexity: 1 Low PT Treatments $Therapeutic  Activity: 8-22 mins PT General Charges $$ ACUTE PT VISIT: 1 Visit         Corean Dade, PT, DPT, GCS [phone]   Ghalia Reicks 06/18/2024, 2:58 PM

## 2024-06-20 LAB — CULTURE, BLOOD (ROUTINE X 2)
Culture  Setup Time: NONE SEEN
Culture: NO GROWTH
Culture: NO GROWTH — AB
Special Requests: ADEQUATE
Special Requests: ADEQUATE

## 2024-08-23 ENCOUNTER — Emergency Department

## 2024-08-23 ENCOUNTER — Inpatient Hospital Stay
Admission: EM | Admit: 2024-08-23 | Discharge: 2024-08-26 | DRG: 871 | Disposition: A | Discharge status: Home Health | Attending: Internal Medicine | Admitting: Internal Medicine

## 2024-08-23 DIAGNOSIS — L98429 Non-pressure chronic ulcer of back with unspecified severity: Secondary | ICD-10-CM | POA: Diagnosis present

## 2024-08-23 DIAGNOSIS — L97509 Non-pressure chronic ulcer of other part of unspecified foot with unspecified severity: Secondary | ICD-10-CM | POA: Diagnosis present

## 2024-08-23 DIAGNOSIS — I4891 Unspecified atrial fibrillation: Secondary | ICD-10-CM

## 2024-08-23 DIAGNOSIS — G4733 Obstructive sleep apnea (adult) (pediatric): Secondary | ICD-10-CM | POA: Diagnosis present

## 2024-08-23 DIAGNOSIS — E872 Acidosis, unspecified: Secondary | ICD-10-CM | POA: Diagnosis present

## 2024-08-23 DIAGNOSIS — I130 Hypertensive heart and chronic kidney disease with heart failure and stage 1 through stage 4 chronic kidney disease, or unspecified chronic kidney disease: Secondary | ICD-10-CM | POA: Diagnosis present

## 2024-08-23 DIAGNOSIS — L98421 Non-pressure chronic ulcer of back limited to breakdown of skin: Secondary | ICD-10-CM | POA: Diagnosis present

## 2024-08-23 DIAGNOSIS — Z9981 Dependence on supplemental oxygen: Secondary | ICD-10-CM

## 2024-08-23 DIAGNOSIS — I471 Supraventricular tachycardia, unspecified: Secondary | ICD-10-CM | POA: Diagnosis present

## 2024-08-23 DIAGNOSIS — N1832 Chronic kidney disease, stage 3b: Secondary | ICD-10-CM | POA: Diagnosis present

## 2024-08-23 DIAGNOSIS — F32A Depression, unspecified: Secondary | ICD-10-CM | POA: Diagnosis present

## 2024-08-23 DIAGNOSIS — Z79890 Hormone replacement therapy (postmenopausal): Secondary | ICD-10-CM

## 2024-08-23 DIAGNOSIS — Z79899 Other long term (current) drug therapy: Secondary | ICD-10-CM

## 2024-08-23 DIAGNOSIS — Z7982 Long term (current) use of aspirin: Secondary | ICD-10-CM

## 2024-08-23 DIAGNOSIS — E66813 Obesity, class 3: Secondary | ICD-10-CM | POA: Diagnosis present

## 2024-08-23 DIAGNOSIS — Z1152 Encounter for screening for COVID-19: Secondary | ICD-10-CM

## 2024-08-23 DIAGNOSIS — J449 Chronic obstructive pulmonary disease, unspecified: Secondary | ICD-10-CM | POA: Diagnosis present

## 2024-08-23 DIAGNOSIS — Z8249 Family history of ischemic heart disease and other diseases of the circulatory system: Secondary | ICD-10-CM

## 2024-08-23 DIAGNOSIS — A419 Sepsis, unspecified organism: Principal | ICD-10-CM | POA: Diagnosis present

## 2024-08-23 DIAGNOSIS — R7989 Other specified abnormal findings of blood chemistry: Secondary | ICD-10-CM | POA: Diagnosis present

## 2024-08-23 DIAGNOSIS — E785 Hyperlipidemia, unspecified: Secondary | ICD-10-CM | POA: Diagnosis present

## 2024-08-23 DIAGNOSIS — I480 Paroxysmal atrial fibrillation: Secondary | ICD-10-CM | POA: Diagnosis present

## 2024-08-23 DIAGNOSIS — E039 Hypothyroidism, unspecified: Secondary | ICD-10-CM | POA: Diagnosis present

## 2024-08-23 DIAGNOSIS — D72829 Elevated white blood cell count, unspecified: Secondary | ICD-10-CM | POA: Diagnosis present

## 2024-08-23 DIAGNOSIS — L89153 Pressure ulcer of sacral region, stage 3: Secondary | ICD-10-CM | POA: Diagnosis present

## 2024-08-23 DIAGNOSIS — I959 Hypotension, unspecified: Secondary | ICD-10-CM | POA: Diagnosis present

## 2024-08-23 DIAGNOSIS — R6520 Severe sepsis without septic shock: Secondary | ICD-10-CM | POA: Diagnosis present

## 2024-08-23 DIAGNOSIS — B379 Candidiasis, unspecified: Secondary | ICD-10-CM | POA: Diagnosis present

## 2024-08-23 DIAGNOSIS — L03311 Cellulitis of abdominal wall: Secondary | ICD-10-CM | POA: Diagnosis present

## 2024-08-23 DIAGNOSIS — I5032 Chronic diastolic (congestive) heart failure: Secondary | ICD-10-CM | POA: Diagnosis present

## 2024-08-23 DIAGNOSIS — Z6841 Body Mass Index (BMI) 40.0 and over, adult: Secondary | ICD-10-CM

## 2024-08-23 DIAGNOSIS — L97521 Non-pressure chronic ulcer of other part of left foot limited to breakdown of skin: Secondary | ICD-10-CM | POA: Diagnosis present

## 2024-08-23 DIAGNOSIS — I890 Lymphedema, not elsewhere classified: Secondary | ICD-10-CM | POA: Diagnosis present

## 2024-08-23 DIAGNOSIS — G9341 Metabolic encephalopathy: Secondary | ICD-10-CM | POA: Diagnosis present

## 2024-08-23 LAB — COMPREHENSIVE METABOLIC PANEL WITH GFR
ALT: 7 U/L (ref 0–44)
AST: 22 U/L (ref 15–41)
Albumin: 4 g/dL (ref 3.5–5.0)
Alkaline Phosphatase: 61 U/L (ref 38–126)
Anion gap: 11 (ref 5–15)
BUN: 36 mg/dL — ABNORMAL HIGH (ref 8–23)
CO2: 30 mmol/L (ref 22–32)
Calcium: 9.1 mg/dL (ref 8.9–10.3)
Chloride: 100 mmol/L (ref 98–111)
Creatinine, Ser: 1.42 mg/dL — ABNORMAL HIGH (ref 0.61–1.24)
GFR, Estimated: 52 mL/min — ABNORMAL LOW
Glucose, Bld: 100 mg/dL — ABNORMAL HIGH (ref 70–99)
Potassium: 4.9 mmol/L (ref 3.5–5.1)
Sodium: 142 mmol/L (ref 135–145)
Total Bilirubin: 0.5 mg/dL (ref 0.0–1.2)
Total Protein: 7.1 g/dL (ref 6.5–8.1)

## 2024-08-23 LAB — CBC WITH DIFFERENTIAL/PLATELET
Abs Immature Granulocytes: 0.1 K/uL — ABNORMAL HIGH (ref 0.00–0.07)
Basophils Absolute: 0.1 K/uL (ref 0.0–0.1)
Basophils Relative: 0 %
Eosinophils Absolute: 0.1 K/uL (ref 0.0–0.5)
Eosinophils Relative: 1 %
HCT: 45.7 % (ref 39.0–52.0)
Hemoglobin: 13.8 g/dL (ref 13.0–17.0)
Immature Granulocytes: 1 %
Lymphocytes Relative: 6 %
Lymphs Abs: 1 K/uL (ref 0.7–4.0)
MCH: 26.3 pg (ref 26.0–34.0)
MCHC: 30.2 g/dL (ref 30.0–36.0)
MCV: 87 fL (ref 80.0–100.0)
Monocytes Absolute: 1.2 K/uL — ABNORMAL HIGH (ref 0.1–1.0)
Monocytes Relative: 7 %
Neutro Abs: 13.8 K/uL — ABNORMAL HIGH (ref 1.7–7.7)
Neutrophils Relative %: 85 %
Platelets: 230 K/uL (ref 150–400)
RBC: 5.25 MIL/uL (ref 4.22–5.81)
RDW: 15.5 % (ref 11.5–15.5)
WBC: 16.3 K/uL — ABNORMAL HIGH (ref 4.0–10.5)
nRBC: 0 % (ref 0.0–0.2)

## 2024-08-23 LAB — LACTIC ACID, PLASMA
Lactic Acid, Venous: 3 mmol/L (ref 0.5–1.9)
Lactic Acid, Venous: 1.9 mmol/L (ref 0.5–1.9)

## 2024-08-23 LAB — BLOOD GAS, VENOUS
Acid-Base Excess: 9.4 mmol/L — ABNORMAL HIGH (ref 0.0–2.0)
Bicarbonate: 36.1 mmol/L — ABNORMAL HIGH (ref 20.0–28.0)
O2 Saturation: 34.3 %
Patient temperature: 37
pCO2, Ven: 57 mmHg (ref 44–60)
pH, Ven: 7.41 (ref 7.25–7.43)
pO2, Ven: <31 mmHg — CL (ref 32–45)

## 2024-08-23 LAB — URINALYSIS, W/ REFLEX TO CULTURE (INFECTION SUSPECTED)
Bacteria, UA: NONE SEEN
Bilirubin Urine: NEGATIVE
Glucose, UA: NEGATIVE mg/dL
Hgb urine dipstick: NEGATIVE
Ketones, ur: NEGATIVE mg/dL
Leukocytes,Ua: NEGATIVE
Nitrite: NEGATIVE
Protein, ur: NEGATIVE mg/dL
Specific Gravity, Urine: 1.014 (ref 1.005–1.030)
Squamous Epithelial / HPF: 0 /HPF (ref 0–5)
pH: 8 (ref 5.0–8.0)

## 2024-08-23 LAB — TROPONIN T, HIGH SENSITIVITY
Troponin T High Sensitivity: 25 ng/L — ABNORMAL HIGH (ref 0–19)
Troponin T High Sensitivity: 27 ng/L — ABNORMAL HIGH (ref 0–19)

## 2024-08-23 LAB — RESP PANEL BY RT-PCR (RSV, FLU A&B, COVID)  RVPGX2
Influenza A by PCR: NEGATIVE
Influenza B by PCR: NEGATIVE
Resp Syncytial Virus by PCR: NEGATIVE
SARS Coronavirus 2 by RT PCR: NEGATIVE

## 2024-08-23 LAB — MAGNESIUM: Magnesium: 2 mg/dL (ref 1.7–2.4)

## 2024-08-23 MED ORDER — IOHEXOL 350 MG/ML SOLN
100.0000 mL | Freq: Once | INTRAVENOUS | Status: AC | PRN
  Administered 2024-08-23: 100 mL via INTRAVENOUS

## 2024-08-23 MED ORDER — LACTATED RINGERS IV BOLUS
500.0000 mL | Freq: Once | INTRAVENOUS | Status: AC
  Administered 2024-08-24: 500 mL via INTRAVENOUS

## 2024-08-23 MED ORDER — METOPROLOL TARTRATE 5 MG/5ML IV SOLN
5.0000 mg | Freq: Once | INTRAVENOUS | Status: AC
  Administered 2024-08-23: 5 mg via INTRAVENOUS
  Filled 2024-08-23: qty 5

## 2024-08-23 MED ORDER — ACETAMINOPHEN 325 MG RE SUPP
650.0000 mg | Freq: Once | RECTAL | Status: AC
  Administered 2024-08-23: 650 mg via RECTAL
  Filled 2024-08-23: qty 2

## 2024-08-23 MED ORDER — LACTATED RINGERS IV BOLUS
1000.0000 mL | Freq: Once | INTRAVENOUS | Status: AC
  Administered 2024-08-23: 1000 mL via INTRAVENOUS

## 2024-08-23 MED ORDER — SODIUM CHLORIDE 0.9 % IV SOLN
2.0000 g | Freq: Once | INTRAVENOUS | Status: AC
  Administered 2024-08-23: 2 g via INTRAVENOUS
  Filled 2024-08-23: qty 12.5

## 2024-08-23 MED ORDER — METRONIDAZOLE 500 MG/100ML IV SOLN
500.0000 mg | Freq: Once | INTRAVENOUS | Status: DC
  Administered 2024-08-23: 500 mg via INTRAVENOUS
  Filled 2024-08-23: qty 100

## 2024-08-23 MED ORDER — SODIUM CHLORIDE 0.9 % IV SOLN
2.0000 g | Freq: Once | INTRAVENOUS | Status: AC
  Administered 2024-08-24: 2 g via INTRAVENOUS
  Filled 2024-08-23: qty 20

## 2024-08-23 MED ORDER — METOPROLOL TARTRATE 5 MG/5ML IV SOLN: 5.0000 mg | Freq: Once | INTRAVENOUS | Status: DC

## 2024-08-23 MED ORDER — DEXAMETHASONE SOD PHOSPHATE PF 10 MG/ML IJ SOLN
10.0000 mg | Freq: Once | INTRAMUSCULAR | Status: AC
  Administered 2024-08-24: 10 mg via INTRAVENOUS

## 2024-08-23 MED ORDER — VANCOMYCIN HCL IN DEXTROSE 1-5 GM/200ML-% IV SOLN
1000.0000 mg | Freq: Once | INTRAVENOUS | Status: AC
  Administered 2024-08-24: 1000 mg via INTRAVENOUS
  Filled 2024-08-23: qty 200

## 2024-08-23 MED ORDER — SODIUM CHLORIDE 0.9 % IV SOLN
2.0000 g | Freq: Once | INTRAVENOUS | Status: AC
  Administered 2024-08-24: 2 g via INTRAVENOUS
  Filled 2024-08-23: qty 2000

## 2024-08-23 NOTE — ED Notes (Signed)
 Pt transported to CT ?

## 2024-08-23 NOTE — Progress Notes (Signed)
 CODE SEPSIS - PHARMACY COMMUNICATION  **Broad Spectrum Antibiotics should be administered within 1 hour of Sepsis diagnosis**  Time Code Sepsis Called/Page Received: 2052  Antibiotics Ordered: Cefepime , metronidazole , vancomycin   Time of 1st antibiotic administration: 2118  Additional action taken by pharmacy: -  If necessary, Name of Provider/Nurse Contacted: -    Lum VEAR Mania ,PharmD Clinical Pharmacist  08/23/2024  8:52 PM

## 2024-08-23 NOTE — ED Notes (Signed)
"  Purewick placed on patient.   "

## 2024-08-23 NOTE — ED Provider Notes (Signed)
 "  [facility] Provider Note    Event Date/Time   First MD Initiated Contact with Patient 08/23/24 2039     (approximate)   History   Chief Complaint Shortness of Breath   HPI  Christopher [name] is a 74 y.o. male with past medical history of hypertension, hyperlipidemia, COPD on 2 L, OSA, CHF, atrial fibrillation, and CKD who presents to the ED complaining of shortness of breath.  Per EMS, family noted that patient was increasingly weak and somewhat confused this evening.  EMS noted a fever of 101 as well as significantly elevated heart rate in the 150s.  Patient appeared to be in atrial fibrillation, which EMS reports he has a history of.  On arrival, history is limited as patient is not sure why he is here, is only able to respond yes or no to questioning.  He currently denies any pain or difficulty breathing.     Physical Exam   Triage Vital Signs: ED Triage Vitals  Encounter Vitals Group     BP      Girls Systolic BP Percentile      Girls Diastolic BP Percentile      Boys Systolic BP Percentile      Boys Diastolic BP Percentile      Pulse      Resp      Temp      Temp src      SpO2      Weight      Height      Head Circumference      Peak Flow      Pain Score      Pain Loc      Pain Education      Exclude from Growth Chart     Most recent vital signs: Vitals:   08/23/24 2310 08/23/24 2315  BP:    Pulse:  (!) 136  Resp:  18  Temp: (!) 101.7 F (38.7 C)   SpO2:  99%    Constitutional: Alert but slow to respond, oriented to person and place, but not time or situation. Eyes: Conjunctivae are normal. Head: Atraumatic. Nose: No congestion/rhinnorhea. Mouth/Throat: Mucous membranes are moist.  Cardiovascular: Tachycardic, irregularly irregular rhythm. Grossly normal heart sounds.  2+ radial pulses bilaterally. Respiratory: Tachypneic with increased respiratory effort.  No retractions. Lungs CTAB. Gastrointestinal: Soft and nontender. No  distention. Musculoskeletal: 2+ pitting edema to knees bilaterally, no associated erythema or warmth noted.  Neurologic:  Normal speech and language. No gross focal neurologic deficits are appreciated.    ED Results / Procedures / Treatments   Labs (all labs ordered are listed, but only abnormal results are displayed) Labs Reviewed  CBC WITH DIFFERENTIAL/PLATELET - Abnormal; Notable for the following components:      Result Value   WBC 16.3 (*)    Neutro Abs 13.8 (*)    Monocytes Absolute 1.2 (*)    Abs Immature Granulocytes 0.10 (*)    All other components within normal limits  COMPREHENSIVE METABOLIC PANEL WITH GFR - Abnormal; Notable for the following components:   Glucose, Bld 100 (*)    BUN 36 (*)    Creatinine, Ser 1.42 (*)    GFR, Estimated 52 (*)    All other components within normal limits  LACTIC ACID, PLASMA - Abnormal; Notable for the following components:   Lactic Acid, Venous 3.0 (*)    All other components within normal limits  URINALYSIS, W/ REFLEX TO CULTURE (INFECTION SUSPECTED) -  Abnormal; Notable for the following components:   Color, Urine YELLOW (*)    APPearance CLEAR (*)    All other components within normal limits  BLOOD GAS, VENOUS - Abnormal; Notable for the following components:   pO2, Ven <31 (*)    Bicarbonate 36.1 (*)    Acid-Base Excess 9.4 (*)    All other components within normal limits  TROPONIN T, HIGH SENSITIVITY - Abnormal; Notable for the following components:   Troponin T High Sensitivity 25 (*)    All other components within normal limits  RESP PANEL BY RT-PCR (RSV, FLU A&B, COVID)  [id number]  CULTURE, BLOOD (ROUTINE X 2)  CULTURE, BLOOD (ROUTINE X 2)  LACTIC ACID, PLASMA  MAGNESIUM   TROPONIN T, HIGH SENSITIVITY     EKG  ED ECG REPORT I, Carlin Palin, the attending physician, personally viewed and interpreted this ECG.   Date: 08/23/2024  EKG Time: 20:50  Rate: 152  Rhythm: atrial fibrillation  Axis: Normal   Intervals:none  ST&T Change: None  RADIOLOGY Chest x-ray reviewed and interpreted by me with no infiltrate, edema, or effusion.  PROCEDURES:  Critical Care performed: Yes, see critical care procedure note(s)  .Critical Care  Performed by: Palin Carlin, MD Authorized by: Palin Carlin, MD   Critical care provider statement:    Critical care time (minutes):  30   Critical care time was exclusive of:  Separately billable procedures and treating other patients and teaching time   Critical care was necessary to treat or prevent imminent or life-threatening deterioration of the following conditions:  Sepsis   Critical care was time spent personally by me on the following activities:  Development of treatment plan with patient or surrogate, discussions with consultants, evaluation of patient's response to treatment, examination of patient, ordering and review of laboratory studies, ordering and review of radiographic studies, ordering and performing treatments and interventions, pulse oximetry, re-evaluation of patient's condition and review of old charts    MEDICATIONS ORDERED IN ED: Medications  metroNIDAZOLE  (FLAGYL ) IVPB 500 mg (500 mg Intravenous New Bag/Given 08/23/24 2328)  vancomycin  (VANCOCIN ) IVPB 1000 mg/200 mL premix (has no administration in time range)  lactated ringers  bolus 1,000 mL (1,000 mLs Intravenous New Bag/Given 08/23/24 2118)  ceFEPIme  (MAXIPIME ) 2 g in sodium chloride  0.9 % 100 mL IVPB (0 g Intravenous Stopped 08/23/24 2148)  acetaminophen  (TYLENOL ) suppository 650 mg (650 mg Rectal Given 08/23/24 2128)  metoprolol  tartrate (LOPRESSOR ) injection 5 mg (5 mg Intravenous Given 08/23/24 2256)  iohexol  (OMNIPAQUE ) 350 MG/ML injection 100 mL (100 mLs Intravenous Contrast Given 08/23/24 2239)     IMPRESSION / MDM / ASSESSMENT AND PLAN / ED COURSE  I reviewed the triage vital signs and the nursing notes.                              74 y.o. male with past medical history of  hypertension, hyperlipidemia, COPD on 2 L, CHF, OSA, CKD, and atrial fibrillation who presents to the ED complaining of generalized weakness and altered mental status for unclear duration.  Patient's presentation is most consistent with acute presentation with potential threat to life or bodily function.  Differential diagnosis includes, but is not limited to, sepsis, pneumonia, UTI, cellulitis, viral illness, arrhythmia, ACS, PE, anemia, electrolyte abnormality, AKI.  Patient ill-appearing, noted to be tachycardic and tachypneic, febrile to 101 with EMS.  Presentation concerning for sepsis and workup initiated, will also initiate broad-spectrum antibiotics.  EKG  shows atrial fibrillation with RVR, no ischemic changes noted.  We will hydrate with IV fluids and plan to administer rate control medication if heart rate does not improve.  Examination of his lower extremities does not appear to show any signs of cellulitis, chest x-ray and urinalysis are pending.  Chest x-ray is unremarkable and urinalysis shows no signs of infection, will further assess with CT of his chest/abdomen/pelvis to determine source of sepsis.  He remains tachycardic despite IV fluids and antibiotics, will treat with IV metoprolol .  We will also give full 30 cc/kg of IV fluids according to his ideal body weight.  Labs with leukocytosis but no significant anemia or electrolyte abnormality, renal function similar compared to previous.  His VBG is reassuring without hypercapnia and he continues to maintain oxygen saturations at 99% on his usual 2 L.  COVID and flu testing is negative, patient turned over to oncoming provider pending CT results and admission.      FINAL CLINICAL IMPRESSION(S) / ED DIAGNOSES   Final diagnoses:  Sepsis without acute organ dysfunction, due to unspecified organism [facility])  Atrial fibrillation with RVR (HCC)     Rx / DC Orders   ED Discharge Orders     None        Note:  This document was  prepared using Dragon voice recognition software and may include unintentional dictation errors.   Willo Dunnings, MD 08/23/24 2337  "

## 2024-08-23 NOTE — ED Triage Notes (Signed)
 Pt to ED via [facility] from home c/o shob, fever and chills. Hx of copd. Hx of afib. Pt currently in afib. EMS reports that pt is axo4. Pt axo2 in triage.   92% 2L [place] 150 HR 115/67 101.4  102 CBG 35 RR

## 2024-08-23 NOTE — ED Provider Notes (Signed)
 ----------------------------------------- 11:08 PM on 08/23/2024 -----------------------------------------  Blood pressure 125/79, pulse (!) 150, temperature (!) 100.8 F (38.2 C), resp. rate (!) 22, height 1.829 m (6'), weight (!) 148.7 kg, SpO2 100%.   Assuming care from Dr. Willo.  In short, Christopher [name] is a 74 y.o. male with a chief complaint of sepsis.  Refer to the original H&P for additional details.  The current plan of care is to follow-up on CT chest/abdomen/pelvis and admit once acute surgical issue has been ruled out.   Clinical Course as of 08/24/24 0035  Thu Aug 23, 2024  2346 Troponin T High Sensitivity(!): 27 Minimally elevated [CF]  2346 CT CHEST ABDOMEN PELVIS W CONTRAST I independently viewed and interpreted the patient's chest/abdomen/pelvis and I can see no obvious source of infection including intra-abdominal.  Radiology confirmed no obvious acute abnormalities. [CF]  2351 Given no obvious source, as well as the patient's age of 59 years, I am going to broaden antibiotic coverage with ampicillin 2 g IV and a dose of ceftriaxone  2 g IV as well as Decadron  10 mg IV.  This should adequately cover the possibility of meningitis.  Even though the patient is not showing meningeal signs, given the unknown source, this should be appropriate while we admit him to the hospitalist.  Given his body habitus and illness at this time, he is not a good candidate for emergency department lumbar puncture, and we will focus on admission and the hospitalist team can order interventional radiology guided lumbar puncture if they feel it is necessary. [CF]  2352 Sepsis assessment complete.  Patient is awake and alert but is acting confused.  He is able to follow commands and he is able to flex, extend, and rotate his head and neck from side-to-side with no meningeal signs.  He has some redness on his feet but obviously also has chronic lymphedema without an obvious source of cellulitis.  I  updated the patient and his daughter about the plan for continued care in the hospital, additional antibiotics, etc.  His heart rate is still about 130 but given his sepsis and his blood pressure being low normal I think that Dr. Jacklin plan to continue with fluid resuscitation rather than aggressively treating A-fib with RVR is the right call. [CF]  2356 Given no source of intra abdominal infection and no indication of any necrotizing fasciitis or specific source that would require metronidazole , I am canceling the metronidazole  order in favor of continuing with ampicillin and ceftriaxone . [CF]  Fri Aug 24, 2024  0035 I consulted by phone with the admitting hospitalist, and they will admit the patient - Dr. Hilma. [CF]    Clinical Course User Index [CF] Gordan Huxley, MD     Medications  vancomycin  (VANCOCIN ) IVPB 1000 mg/200 mL premix (1,000 mg Intravenous New Bag/Given 08/24/24 0010)  ampicillin (OMNIPEN) 2 g in sodium chloride  0.9 % 100 mL IVPB (has no administration in time range)  cefTRIAXone  (ROCEPHIN ) 2 g in sodium chloride  0.9 % 100 mL IVPB (2 g Intravenous New Bag/Given 08/24/24 0030)  lactated ringers  bolus 1,000 mL (0 mLs Intravenous Stopped 08/24/24 0010)  ceFEPIme  (MAXIPIME ) 2 g in sodium chloride  0.9 % 100 mL IVPB (0 g Intravenous Stopped 08/23/24 2148)  acetaminophen  (TYLENOL ) suppository 650 mg (650 mg Rectal Given 08/23/24 2128)  metoprolol  tartrate (LOPRESSOR ) injection 5 mg (5 mg Intravenous Given 08/23/24 2256)  iohexol  (OMNIPAQUE ) 350 MG/ML injection 100 mL (100 mLs Intravenous Contrast Given 08/23/24 2239)  lactated ringers  bolus 1,000 mL (  1,000 mLs Intravenous New Bag/Given 08/23/24 2344)  lactated ringers  bolus 500 mL (500 mLs Intravenous New Bag/Given 08/24/24 0031)  dexamethasone  (DECADRON ) injection 10 mg (10 mg Intravenous Given 08/24/24 0031)     ED Discharge Orders     None      Final diagnoses:  Sepsis without acute organ dysfunction, due to unspecified organism [facility])   Atrial fibrillation with RVR (HCC)  Elevated troponin level  Skin ulcer of sacrum, unspecified ulcer stage (HCC)     Gordan Huxley, MD 08/24/24 [phone]

## 2024-08-23 NOTE — Sepsis Progress Note (Signed)
 Elink monitoring for the code sepsis protocol.

## 2024-08-24 ENCOUNTER — Inpatient Hospital Stay

## 2024-08-24 ENCOUNTER — Other Ambulatory Visit: Payer: Self-pay

## 2024-08-24 DIAGNOSIS — J449 Chronic obstructive pulmonary disease, unspecified: Secondary | ICD-10-CM | POA: Diagnosis present

## 2024-08-24 DIAGNOSIS — G9341 Metabolic encephalopathy: Secondary | ICD-10-CM | POA: Diagnosis present

## 2024-08-24 DIAGNOSIS — L98429 Non-pressure chronic ulcer of back with unspecified severity: Secondary | ICD-10-CM | POA: Diagnosis not present

## 2024-08-24 DIAGNOSIS — L03311 Cellulitis of abdominal wall: Secondary | ICD-10-CM

## 2024-08-24 DIAGNOSIS — I959 Hypotension, unspecified: Secondary | ICD-10-CM

## 2024-08-24 DIAGNOSIS — Z1152 Encounter for screening for COVID-19: Secondary | ICD-10-CM | POA: Diagnosis not present

## 2024-08-24 DIAGNOSIS — L97529 Non-pressure chronic ulcer of other part of left foot with unspecified severity: Secondary | ICD-10-CM | POA: Diagnosis not present

## 2024-08-24 DIAGNOSIS — E039 Hypothyroidism, unspecified: Secondary | ICD-10-CM

## 2024-08-24 DIAGNOSIS — I5032 Chronic diastolic (congestive) heart failure: Secondary | ICD-10-CM | POA: Diagnosis present

## 2024-08-24 DIAGNOSIS — I471 Supraventricular tachycardia, unspecified: Secondary | ICD-10-CM | POA: Diagnosis present

## 2024-08-24 DIAGNOSIS — R6520 Severe sepsis without septic shock: Secondary | ICD-10-CM | POA: Diagnosis present

## 2024-08-24 DIAGNOSIS — F32A Depression, unspecified: Secondary | ICD-10-CM | POA: Diagnosis present

## 2024-08-24 DIAGNOSIS — I4891 Unspecified atrial fibrillation: Secondary | ICD-10-CM | POA: Diagnosis not present

## 2024-08-24 DIAGNOSIS — E872 Acidosis, unspecified: Secondary | ICD-10-CM | POA: Diagnosis present

## 2024-08-24 DIAGNOSIS — E785 Hyperlipidemia, unspecified: Secondary | ICD-10-CM

## 2024-08-24 DIAGNOSIS — B379 Candidiasis, unspecified: Secondary | ICD-10-CM | POA: Diagnosis present

## 2024-08-24 DIAGNOSIS — I480 Paroxysmal atrial fibrillation: Secondary | ICD-10-CM

## 2024-08-24 DIAGNOSIS — A419 Sepsis, unspecified organism: Secondary | ICD-10-CM | POA: Diagnosis present

## 2024-08-24 DIAGNOSIS — E66813 Obesity, class 3: Secondary | ICD-10-CM | POA: Diagnosis present

## 2024-08-24 DIAGNOSIS — L89153 Pressure ulcer of sacral region, stage 3: Secondary | ICD-10-CM | POA: Diagnosis present

## 2024-08-24 DIAGNOSIS — N1832 Chronic kidney disease, stage 3b: Secondary | ICD-10-CM | POA: Diagnosis present

## 2024-08-24 DIAGNOSIS — Z7982 Long term (current) use of aspirin: Secondary | ICD-10-CM | POA: Diagnosis not present

## 2024-08-24 DIAGNOSIS — R7989 Other specified abnormal findings of blood chemistry: Secondary | ICD-10-CM | POA: Diagnosis present

## 2024-08-24 DIAGNOSIS — D72829 Elevated white blood cell count, unspecified: Secondary | ICD-10-CM | POA: Diagnosis present

## 2024-08-24 DIAGNOSIS — Z6841 Body Mass Index (BMI) 40.0 and over, adult: Secondary | ICD-10-CM | POA: Diagnosis not present

## 2024-08-24 DIAGNOSIS — J42 Unspecified chronic bronchitis: Secondary | ICD-10-CM

## 2024-08-24 DIAGNOSIS — L97521 Non-pressure chronic ulcer of other part of left foot limited to breakdown of skin: Secondary | ICD-10-CM | POA: Diagnosis present

## 2024-08-24 DIAGNOSIS — Z9981 Dependence on supplemental oxygen: Secondary | ICD-10-CM | POA: Diagnosis not present

## 2024-08-24 DIAGNOSIS — L97509 Non-pressure chronic ulcer of other part of unspecified foot with unspecified severity: Secondary | ICD-10-CM | POA: Diagnosis present

## 2024-08-24 DIAGNOSIS — I130 Hypertensive heart and chronic kidney disease with heart failure and stage 1 through stage 4 chronic kidney disease, or unspecified chronic kidney disease: Secondary | ICD-10-CM | POA: Diagnosis present

## 2024-08-24 DIAGNOSIS — L98421 Non-pressure chronic ulcer of back limited to breakdown of skin: Secondary | ICD-10-CM | POA: Diagnosis present

## 2024-08-24 LAB — CBC
HCT: 42.7 % (ref 39.0–52.0)
Hemoglobin: 13 g/dL (ref 13.0–17.0)
MCH: 26.4 pg (ref 26.0–34.0)
MCHC: 30.4 g/dL (ref 30.0–36.0)
MCV: 86.6 fL (ref 80.0–100.0)
Platelets: 237 K/uL (ref 150–400)
RBC: 4.93 MIL/uL (ref 4.22–5.81)
RDW: 15.3 % (ref 11.5–15.5)
WBC: 22.7 K/uL — ABNORMAL HIGH (ref 4.0–10.5)
nRBC: 0 % (ref 0.0–0.2)

## 2024-08-24 LAB — BASIC METABOLIC PANEL WITH GFR
Anion gap: 9 (ref 5–15)
Anion gap: 10 (ref 5–15)
BUN: 34 mg/dL — ABNORMAL HIGH (ref 8–23)
BUN: 34 mg/dL — ABNORMAL HIGH (ref 8–23)
CO2: 26 mmol/L (ref 22–32)
CO2: 24 mmol/L (ref 22–32)
Calcium: 8.9 mg/dL (ref 8.9–10.3)
Calcium: 8.5 mg/dL — ABNORMAL LOW (ref 8.9–10.3)
Chloride: 103 mmol/L (ref 98–111)
Chloride: 105 mmol/L (ref 98–111)
Creatinine, Ser: 1.42 mg/dL — ABNORMAL HIGH (ref 0.61–1.24)
Creatinine, Ser: 1.43 mg/dL — ABNORMAL HIGH (ref 0.61–1.24)
GFR, Estimated: 52 mL/min — ABNORMAL LOW
GFR, Estimated: 51 mL/min — ABNORMAL LOW
Glucose, Bld: 148 mg/dL — ABNORMAL HIGH (ref 70–99)
Glucose, Bld: 142 mg/dL — ABNORMAL HIGH (ref 70–99)
Potassium: 5.2 mmol/L — ABNORMAL HIGH (ref 3.5–5.1)
Potassium: 4.7 mmol/L (ref 3.5–5.1)
Sodium: 138 mmol/L (ref 135–145)
Sodium: 138 mmol/L (ref 135–145)

## 2024-08-24 LAB — SEDIMENTATION RATE: Sed Rate: 27 mm/h — ABNORMAL HIGH (ref 0–20)

## 2024-08-24 LAB — PRO BRAIN NATRIURETIC PEPTIDE: Pro Brain Natriuretic Peptide: 155 pg/mL

## 2024-08-24 LAB — C-REACTIVE PROTEIN: CRP: 1.5 mg/dL — ABNORMAL HIGH

## 2024-08-24 MED ORDER — METOPROLOL TARTRATE 5 MG/5ML IV SOLN: 2.5000 mg | INTRAVENOUS | Status: DC | PRN

## 2024-08-24 MED ORDER — ZINC OXIDE 40 % EX OINT
TOPICAL_OINTMENT | Freq: Two times a day (BID) | CUTANEOUS | Status: DC
  Filled 2024-08-24: qty 113

## 2024-08-24 MED ORDER — SODIUM CHLORIDE 0.9 % IV SOLN
2.0000 g | INTRAVENOUS | Status: DC
  Administered 2024-08-25 – 2024-08-26 (×2): 2 g via INTRAVENOUS
  Filled 2024-08-24 (×2): qty 20

## 2024-08-24 MED ORDER — VANCOMYCIN HCL 1500 MG/300ML IV SOLN
1500.0000 mg | Freq: Once | INTRAVENOUS | Status: AC
  Administered 2024-08-24: 1500 mg via INTRAVENOUS
  Filled 2024-08-24: qty 300

## 2024-08-24 MED ORDER — IBUPROFEN 600 MG PO TABS: 600.0000 mg | ORAL_TABLET | Freq: Once | ORAL | Status: DC

## 2024-08-24 MED ORDER — VANCOMYCIN HCL 1750 MG/350ML IV SOLN
1750.0000 mg | INTRAVENOUS | Status: DC
  Administered 2024-08-25 – 2024-08-26 (×2): 1750 mg via INTRAVENOUS
  Filled 2024-08-24 (×3): qty 350

## 2024-08-24 MED ORDER — ONDANSETRON HCL 4 MG/2ML IJ SOLN: 4.0000 mg | Freq: Three times a day (TID) | INTRAMUSCULAR | Status: DC | PRN

## 2024-08-24 MED ORDER — ALBUTEROL SULFATE (2.5 MG/3ML) 0.083% IN NEBU: 2.5000 mg | INHALATION_SOLUTION | RESPIRATORY_TRACT | Status: DC | PRN

## 2024-08-24 MED ORDER — ATORVASTATIN CALCIUM 20 MG PO TABS
20.0000 mg | ORAL_TABLET | Freq: Every day | ORAL | Status: DC
  Administered 2024-08-24 – 2024-08-25 (×3): 20 mg via ORAL
  Filled 2024-08-24 (×3): qty 1

## 2024-08-24 MED ORDER — DM-GUAIFENESIN ER 30-600 MG PO TB12: 1.0000 | ORAL_TABLET | Freq: Two times a day (BID) | ORAL | Status: DC | PRN

## 2024-08-24 MED ORDER — ASPIRIN 81 MG PO TBEC
81.0000 mg | DELAYED_RELEASE_TABLET | Freq: Every day | ORAL | Status: DC
  Administered 2024-08-24 – 2024-08-25 (×3): 81 mg via ORAL
  Filled 2024-08-24 (×3): qty 1

## 2024-08-24 MED ORDER — NYSTATIN 100000 UNIT/GM EX POWD
Freq: Two times a day (BID) | CUTANEOUS | Status: DC
  Filled 2024-08-24 (×2): qty 15

## 2024-08-24 MED ORDER — BUPROPION HCL ER (SR) 150 MG PO TB12
150.0000 mg | ORAL_TABLET | Freq: Two times a day (BID) | ORAL | Status: DC
  Administered 2024-08-24 – 2024-08-26 (×5): 150 mg via ORAL
  Filled 2024-08-24 (×7): qty 1

## 2024-08-24 MED ORDER — VITAMIN D 25 MCG (1000 UNIT) PO TABS
1000.0000 [IU] | ORAL_TABLET | Freq: Every day | ORAL | Status: DC
  Administered 2024-08-24 – 2024-08-25 (×3): 1000 [IU] via ORAL
  Filled 2024-08-24 (×3): qty 1

## 2024-08-24 MED ORDER — MIDODRINE HCL 5 MG PO TABS
10.0000 mg | ORAL_TABLET | Freq: Three times a day (TID) | ORAL | Status: DC
  Administered 2024-08-24 – 2024-08-26 (×7): 10 mg via ORAL
  Filled 2024-08-24 (×8): qty 2

## 2024-08-24 MED ORDER — LEVOTHYROXINE SODIUM 88 MCG PO TABS
88.0000 ug | ORAL_TABLET | Freq: Every day | ORAL | Status: DC
  Administered 2024-08-24 – 2024-08-26 (×3): 88 ug via ORAL
  Filled 2024-08-24 (×3): qty 1

## 2024-08-24 MED ORDER — ACETAMINOPHEN 325 MG PO TABS: 650.0000 mg | ORAL_TABLET | Freq: Four times a day (QID) | ORAL | Status: DC | PRN

## 2024-08-24 MED ORDER — SERTRALINE HCL 50 MG PO TABS
50.0000 mg | ORAL_TABLET | Freq: Every day | ORAL | Status: DC
  Administered 2024-08-24 – 2024-08-26 (×3): 50 mg via ORAL
  Filled 2024-08-24 (×3): qty 1

## 2024-08-24 MED ORDER — ENOXAPARIN SODIUM 80 MG/0.8ML IJ SOSY
0.5000 mg/kg | PREFILLED_SYRINGE | INTRAMUSCULAR | Status: DC
  Administered 2024-08-24 – 2024-08-26 (×3): 75 mg via SUBCUTANEOUS
  Filled 2024-08-24: qty 0.75
  Filled 2024-08-24 (×2): qty 0.8
  Filled 2024-08-24: qty 0.75

## 2024-08-24 MED ORDER — BISACODYL 10 MG RE SUPP: 10.0000 mg | Freq: Every day | RECTAL | Status: DC | PRN

## 2024-08-24 MED ORDER — MELATONIN 5 MG PO TABS
5.0000 mg | ORAL_TABLET | Freq: Every day | ORAL | Status: DC
  Administered 2024-08-24 – 2024-08-25 (×3): 5 mg via ORAL
  Filled 2024-08-24 (×3): qty 1

## 2024-08-24 NOTE — ED Notes (Signed)
 Fall risk bundle is currently in place.

## 2024-08-24 NOTE — Progress Notes (Signed)
 Anticoagulation monitoring(Lovenox ):  74 yo male ordered Lovenox  40 mg Q24h    Filed Weights   08/23/24 2055  Weight: (!) 148.7 kg (327 lb 14.4 oz)   BMI 44.5   Lab Results  Component Value Date   CREATININE 1.42 (H) 08/23/2024   CREATININE 1.36 (H) 06/18/2024   CREATININE 1.29 (H) 06/17/2024   Estimated Creatinine Clearance: 68.4 mL/min (A) (by C-G formula based on SCr of 1.42 mg/dL (H)). Hemoglobin & Hematocrit     Component Value Date/Time   HGB 13.8 08/23/2024 2126   HCT 45.7 08/23/2024 2126     Per Protocol for Patient with estCrcl > 30 ml/min and BMI > 30, will transition to Lovenox  75 mg Q24h.

## 2024-08-24 NOTE — ED Notes (Signed)
 Pt placed on bedpan by NT. Pt unable to have a BM at this time. Bedpan removed and pt repositioned by this RN.

## 2024-08-24 NOTE — Evaluation (Signed)
 Occupational Therapy Evaluation Patient Details Name: Christopher [name] MRN: [id number] DOB: 1950-09-05 Today's Date: 08/24/2024   History of Present Illness   Christopher [name] is a 74 y.o. male with medical history significant of morbid obesity with BMI of 44.47, HTN, HLD, dCHF, COPD on 2L O2, hypothyroidism, depression with anxiety, CKD stage IIIb, A-fib not on anticoagulants, OSA, chronic venous insufficiency change in both legs, hypotension on midodrine , who presents with fever, chills, weakness.     Clinical Impressions Christopher [name] was seen for OT evaluation this date. Prior to hospital admission, pt was working with [facility] PT on improving his mobility. Pt lives at home with his daughter providing assistance for LB dressing and WC follow for longer distances. Pt presents with deficits in strength, activity tolerance, LB access and balance limiting his ability to perform ADL management at baseline level. Pt currently requires MOD A for bed mobility, MAX A for LB ADL management from STS, and CGA for safety with lateral scooting.  Pt would benefit from skilled OT services to address noted impairments and functional limitations (see below for any additional details) in order to maximize safety and independence while minimizing future risk of falls, injury, and readmission. Anticipate the need for follow up OT services in the home setting upon acute hospital DC.      If plan is discharge home, recommend the following:   A lot of help with walking and/or transfers;A lot of help with bathing/dressing/bathroom;Assist for transportation;Assistance with cooking/housework     Functional Status Assessment   Patient has had a recent decline in their functional status and demonstrates the ability to make significant improvements in function in a reasonable and predictable amount of time.     Equipment Recommendations   None recommended by OT (Pt has necessary equipment.)     Recommendations for  Other Services         Precautions/Restrictions   Precautions Precautions: Fall Recall of Precautions/Restrictions: Intact Restrictions Weight Bearing Restrictions Per Provider Order: No     Mobility Bed Mobility Overal bed mobility: Needs Assistance Bed Mobility: Supine to Sit, Sit to Supine     Supine to sit: Mod assist, Used rails, HOB elevated Sit to supine: Mod assist, Used rails, HOB elevated   General bed mobility comments: Educated on log-roll technique, pt unable to sequence for return to supine.    Transfers Overall transfer level: Needs assistance Equipment used: 1 person hand held assist Transfers: Sit to/from Stand Sit to Stand: Mod assist                  Balance Overall balance assessment: Needs assistance Sitting-balance support: Feet supported, Single extremity supported, No upper extremity supported Sitting balance-Leahy Scale: Good Sitting balance - Comments: steady static sitting, reaching inside BOS.   Standing balance support: During functional activity Standing balance-Leahy Scale: Poor Standing balance comment: Unable to come to full stand at EOB. Anticipate improved independence with RW for transfers.                           ADL either performed or assessed with clinical judgement   ADL Overall ADL's : Needs assistance/impaired                                       General ADL Comments: MAX A to don hospital socks at EOB, MOD A for bed mobility  sup<>sit. Pt is able to laterally scoot toward [facility] with increased time/effort to perform but no physical assist from therapist.     Vision Patient Visual Report: No change from baseline       Perception         Praxis         Pertinent Vitals/Pain Pain Assessment Pain Assessment: 0-10 Pain Score: 7  Pain Location: R knee Pain Descriptors / Indicators: Sore, Discomfort Pain Intervention(s): Limited activity within patient's tolerance, Monitored  during session, Repositioned     Extremity/Trunk Assessment Upper Extremity Assessment Upper Extremity Assessment: Generalized weakness   Lower Extremity Assessment Lower Extremity Assessment: Generalized weakness;Defer to PT evaluation   Cervical / Trunk Assessment Cervical / Trunk Assessment: Other exceptions Cervical / Trunk Exceptions: BMI 44.4   Communication Communication Communication: No apparent difficulties   Cognition Arousal: Alert, Lethargic Behavior During Therapy: WFL for tasks assessed/performed Cognition: No apparent impairments             OT - Cognition Comments: mild delayed processing, but able to follow VCs consistently t/o session.                 Following commands: Intact       Cueing  General Comments   Cueing Techniques: Verbal cues      Exercises Other Exercises Other Exercises: Pt/caregiver educated on role of OT in acute setting, safety, falls prevention strategies, and DC recs.   Shoulder Instructions      Home Living Family/patient expects to be discharged to:: Private residence Living Arrangements: Children Available Help at Discharge: Family;Available 24 hours/day Type of Home: House Home Access: Stairs to enter [employer] of Steps: 2 Entrance Stairs-Rails: Can reach both Home Layout: Able to live on main level with bedroom/bathroom     Bathroom Shower/Tub: Producer, Television/film/video: Standard     Home Equipment: Agricultural Consultant (2 wheels);Rollator (4 wheels);Wheelchair - manual;Grab bars - tub/shower;Lift chair;Shower seat   Additional Comments: sleeps and sits in lift chair      Prior Functioning/Environment Prior Level of Function : Needs assist             Mobility Comments: Has been working with [facility] PT to improve mobility. Able to walk [facility] distances with RW and dtr following with WC. ADLs Comments: daughter helps with getting in/out of shower and donning socks/shoes.    OT Problem  List: Decreased strength;Decreased coordination;Decreased range of motion;Decreased activity tolerance;Decreased safety awareness;Impaired balance (sitting and/or standing);Decreased knowledge of use of DME or AE;Pain;Increased edema   OT Treatment/Interventions: Self-care/ADL training;Therapeutic activities;Therapeutic exercise;DME and/or AE instruction;Patient/family education;Balance training;Energy conservation      OT Goals(Current goals can be found in the care plan section)   Acute Rehab OT Goals Patient Stated Goal: To get stronger/go home OT Goal Formulation: With patient/family Time For Goal Achievement: 09/07/24 Potential to Achieve Goals: Good ADL Goals Pt Will Perform Grooming: with set-up;with supervision;sitting Pt Will Perform Lower Body Dressing: with min assist;sit to/from stand;with adaptive equipment Pt Will Transfer to Toilet: bedside commode;with modified independence;stand pivot transfer Pt Will Perform Toileting - Clothing Manipulation and hygiene: with adaptive equipment;sit to/from stand;with modified independence   OT Frequency:  Min 2X/week    Co-evaluation              AM-PAC OT 6 Clicks Daily Activity     Outcome Measure Help from another person eating meals?: A Little Help from another person taking care of personal grooming?: A Little Help  from another person toileting, which includes using toliet, bedpan, or urinal?: A Little Help from another person bathing (including washing, rinsing, drying)?: A Lot Help from another person to put on and taking off regular upper body clothing?: A Little Help from another person to put on and taking off regular lower body clothing?: A Lot 6 Click Score: 16   End of Session Equipment Utilized During Treatment: Gait belt Nurse Communication: Mobility status  Activity Tolerance: Patient tolerated treatment well Patient left: in bed;with call bell/phone within reach;with bed alarm set;with family/visitor  present  OT Visit Diagnosis: Other abnormalities of gait and mobility (R26.89);Muscle weakness (generalized) (M62.81)                Time: 9166-9098 OT Time Calculation (min): 28 min Charges:  OT General Charges $OT Visit: 1 Visit OT Treatments $Self Care/Home Management : 8-22 mins  Jhonny Pelton, M.S., OTR/L 08/24/2024, 10:53 AM

## 2024-08-24 NOTE — ED Notes (Signed)
 This [name], Christopher [name], and Christopher [name] provided pericare to the patient after urine incontinence. Patient was provided a clean brief and chux pads. Patient was also repositioned and stated no further needs at this time.

## 2024-08-24 NOTE — ED Notes (Signed)
 Pt sleeping at this time. No distress. Respirations even and unlabored.

## 2024-08-24 NOTE — Evaluation (Addendum)
 Physical Therapy Evaluation Patient Details Name: Christopher [name] MRN: [id number] DOB: 24-May-1951 Today's Date: 08/24/2024  History of Present Illness  Christopher [name] is a 74 y.o. male with medical history significant of morbid obesity with BMI of 44.47, HTN, HLD, dCHF, COPD on 2L O2, hypothyroidism, depression with anxiety, CKD stage IIIb, A-fib not on anticoagulants, OSA, chronic venous insufficiency change in both legs, hypotension on midodrine , who presents with fever, chills, weakness.   Clinical Impression  Patient received in bed sleeping, requires increased time to get going. Daughter at bedside. Patient requires cga for supine><sit using a lot of momentum to get seated. He is able to stand and side step about 4 feet with BRW and cga. Patient will continue to benefit from skilled PT to improve strength, endurance and independence.       If plan is discharge home, recommend the following: A lot of help with walking and/or transfers;A lot of help with bathing/dressing/bathroom   Can travel by private vehicle    no    Equipment Recommendations None recommended by PT  Recommendations for Other Services       Functional Status Assessment Patient has had a recent decline in their functional status and demonstrates the ability to make significant improvements in function in a reasonable and predictable amount of time.     Precautions / Restrictions Precautions Precautions: Fall Recall of Precautions/Restrictions: Intact Restrictions Weight Bearing Restrictions Per Provider Order: No      Mobility  Bed Mobility Overal bed mobility: Needs Assistance Bed Mobility: Supine to Sit, Sit to Supine     Supine to sit: Contact guard, HOB elevated, Used rails Sit to supine: Supervision, Used rails   General bed mobility comments: uses a lot of momentum and bed rails to exit and return to bed    Transfers Overall transfer level: Needs assistance Equipment used: Rolling walker (2  wheels) Transfers: Sit to/from Stand Sit to Stand: Min assist                Ambulation/Gait Ambulation/Gait assistance: Editor, Commissioning (Feet): 4 Feet Assistive device: Rolling walker (2 wheels)   Gait velocity: decr     General Gait Details: patient is able to side step with RW ~4 feet  Stairs            Wheelchair Mobility     Tilt Bed    Modified Rankin (Stroke Patients Only)       Balance Overall balance assessment: Needs assistance Sitting-balance support: Feet supported Sitting balance-Leahy Scale: Good     Standing balance support: Bilateral upper extremity supported, During functional activity, Reliant on assistive device for balance Standing balance-Leahy Scale: Fair                               Pertinent Vitals/Pain Pain Assessment Pain Assessment: Faces Faces Pain Scale: Hurts a little bit Pain Location: R knee Pain Descriptors / Indicators: Sore, Discomfort Pain Intervention(s): Monitored during session, Repositioned    Home Living Family/patient expects to be discharged to:: Private residence Living Arrangements: Children Available Help at Discharge: Family;Available 24 hours/day Type of Home: House Home Access: Stairs to enter Entrance Stairs-Rails: Can reach both;Left;Right Entrance Stairs-Number of Steps: 2   Home Layout: Able to live on main level with bedroom/bathroom Home Equipment: [employer]ant (2 wheels);Rollator (4 wheels);Wheelchair - manual;Grab bars - tub/shower;Lift chair;Shower seat      Prior Function Prior Level of Function : Needs  assist             Mobility Comments: Has been working with [facility] PT to improve mobility. Able to walk [facility] distances with RW and dtr following with WC. ADLs Comments: daughter helps with getting in/out of shower and donning socks/shoes.     Extremity/Trunk Assessment   Upper Extremity Assessment Upper Extremity Assessment: Defer to OT evaluation    Lower  Extremity Assessment Lower Extremity Assessment: Generalized weakness    Cervical / Trunk Assessment Cervical / Trunk Assessment: Normal Cervical / Trunk Exceptions: BMI 44.4  Communication   Communication Communication: No apparent difficulties    Cognition Arousal: Alert, Lethargic Behavior During Therapy: WFL for tasks assessed/performed   PT - Cognitive impairments: Problem solving, Safety/Judgement, Memory                         Following commands: Intact       Cueing Cueing Techniques: Verbal cues     General Comments      Exercises     Assessment/Plan    PT Assessment Patient needs continued PT services  PT Problem List Decreased strength;Decreased activity tolerance;Decreased balance;Decreased mobility;Obesity;Pain       PT Treatment Interventions DME instruction;Gait training;Stair training;Functional mobility training;Therapeutic activities;Patient/family education;Therapeutic exercise    PT Goals (Current goals can be found in the Care Plan section)  Acute Rehab PT Goals Patient Stated Goal: return home with daughter PT Goal Formulation: With patient/family Time For Goal Achievement: 09/07/24 Potential to Achieve Goals: Good    Frequency Min 2X/week     Co-evaluation               AM-PAC PT 6 Clicks Mobility  Outcome Measure Help needed turning from your back to your side while in a flat bed without using bedrails?: A Little Help needed moving from lying on your back to sitting on the side of a flat bed without using bedrails?: A Little Help needed moving to and from a bed to a chair (including a wheelchair)?: A Lot Help needed standing up from a chair using your arms (e.g., wheelchair or bedside chair)?: A Little Help needed to walk in hospital room?: A Lot Help needed climbing 3-5 steps with a railing? : A Lot 6 Click Score: 15    End of Session Equipment Utilized During Treatment: Oxygen Activity Tolerance: Patient  limited by fatigue;Patient limited by pain Patient left: in bed;with call bell/phone within reach;with family/visitor present Nurse Communication: Mobility status PT Visit Diagnosis: Muscle weakness (generalized) (M62.81);Other abnormalities of gait and mobility (R26.89);Difficulty in walking, not elsewhere classified (R26.2);Unsteadiness on feet (R26.81);Pain Pain - Right/Left: Right Pain - part of body: Knee    Time: 1349-1402 PT Time Calculation (min) (ACUTE ONLY): 13 min   Charges:   PT Evaluation $PT Eval Low Complexity: 1 Low   PT General Charges $$ ACUTE PT VISIT: 1 Visit        Mearl Harewood, PT, GCS 08/24/2024,2:54 PM

## 2024-08-24 NOTE — ED Notes (Signed)
 Incontinence brief and absorbent pad changed. Perineal area cleansed with bath wipes. Desitin applied. Suprapubic skin fold cleansed with bath wipes. Erythema present with nystatin  powder. Area cleansed and new nystatin  powder applied.

## 2024-08-24 NOTE — Progress Notes (Incomplete)
" °  Progress Note   Patient: Christopher [name] FMW:[id number] DOB: July 12, 1951 DOA: 08/23/2024     0 DOS: the patient was seen and examined on 08/24/2024   Brief hospital course: No notes on file  Assessment and Plan: No notes have been filed under this hospital service. Service: Hospitalist     {Tip this will not be part of the note when signed Body mass index is 44.47 kg/m. , ,  Active Pressure Injury/Wound(s)     None          (Optional):26781}  Subjective: ***  Physical Exam: Vitals:   08/24/24 1406 08/24/24 1419 08/24/24 1636 08/24/24 1759  BP:   118/62 (!) 127/55  Pulse:   (!) 108 78  Resp:   13   Temp:  98.5 F (36.9 C)  98.6 F (37 C)  TempSrc:  Oral    SpO2: 100%  98% 94%  Weight:      Height:       *** Data Reviewed: {Tip this will not be part of the note when signed- Document your independent interpretation of telemetry tracing, EKG, lab, Radiology test or any other diagnostic tests. Add any new diagnostic test ordered today. (Optional):26781} {Results:26384}  Family Communication: ***  Disposition: Status is: Inpatient {Inpatient:23812}  Planned Discharge Destination: {DISCHARGE DESTINATION_TRH:27031} {Tip this will not be part of the note when signed  DVT Prophylaxis  .,  (Optional):26781}   Time spent: *** minutes  Author: Davit Vassar, DO 08/24/2024 7:10 PM  For on call review [email or web address].  "

## 2024-08-24 NOTE — Consult Note (Addendum)
 WOC Nurse Consult Note: patient familiar to [facility] team from previous admission with deep tissue pressure injury sacrum/buttocks (see media 10/25) overall appearance of buttocks/sacrum has improved  Reason for Consult: sacral wound and L foot wound  Wound type: 1.  Stage 3 Pressure Injury sacrococcygeal linear  red moist  2.  Full thickness L great toe, 3rd digit and 5th digit dry brown/tan tissue  3.  Full thickness B buttocks/ischium likely related to moisture and friction scattered brown dry tissue  4.  Intertriginous dermatitis underneath pannus/abdominal fold erythema with scattered partial thickness skin loss  ICD-10 CM Codes for Irritant Dermatitis   L30.4  - Erythema intertrigo. Also used for abrasion of the hand, chafing of the skin, dermatitis due to sweating and friction, friction dermatitis, friction eczema, and genital/thigh intertrigo.  Pressure Injury POA: Yes Measurement: see nursing flowsheet  Wound bed: as above  Drainage (amount, consistency, odor) see nursing flowsheet  Periwound: buttocks now appear with chronic tissue damage (widespread purple discoloration from chronic pressure)  Dressing procedure/placement/frequency:  Cleanse underneath pannus with soap and water, dry. Sprinkle with Nystatin  (ordered by primary MD) then apply Interdry AG as follows: Order Gerlean # [phone] Measure and cut length of InterDry to fit in skin folds that have skin breakdown Tuck InterDry fabric into skin folds in a single layer, allow for 2 inches of overhang from skin edges to allow for wicking to occur May remove to bathe; dry area thoroughly and then tuck into affected areas again Do not apply any creams or ointments when using InterDry DO NOT THROW AWAY FOR 5 DAYS unless soiled with stool DO NOT [facility] product, this will inactivate the silver in the material  New sheet of Interdry should be applied after 5 days of use if patient continues to have skin breakdown  Cleanse sacrococcygeal area  with soap and water, dry and apply silver hydrofiber ([place]son [phone]) to area daily (cut in a linear strip to fit area).  Secure with silicone foam or ABD pad and tape whichever is preferred.  Cleanse buttocks/ischium with soap and water, dry and apply a thin layer of Desitin to area 2 times daily and prn soiling.   Paint all toe wounds L foot with Betadine  2 times daily and allow to air dry.  Place a piece of silver hydrofiber (Lawson [phone] Aquacel AG) between 4th and 5th digit to separate and absorb any moisture.    POC discussed with bedside nurse. WOC team will not follow Reconsult if further needs arise.   Thank you,    Powell Bar MSN, RN-BC, [employer]

## 2024-08-24 NOTE — ED Notes (Signed)
 Pt moved to cpod   report off to jeanna/patty rn's.

## 2024-08-24 NOTE — Progress Notes (Signed)
 Pharmacy Antibiotic Note  Christopher [name] is a 74 y.o. male admitted on 08/23/2024 with cellulitis.  Pharmacy has been consulted for Vancomycin  dosing.  Plan: Vancomycin  1 gm IV X 1 given in ED on 1/2 @ 0010.  Additional Vancomycin  1500 mg IV X 1 ordered to make 2500 mg loading dose  Vancomycin  1750 mg IV Q24H ordered to start on 1/3 @ 0000.  AUC = 511.9 Vanc trough = 12.8   Height: 6' (182.9 cm) Weight: (!) 148.7 kg (327 lb 14.4 oz) IBW/kg (Calculated) : 77.6  Temp (24hrs), Avg:100.5 F (38.1 C), Min:99 F (37.2 C), Max:101.7 F (38.7 C)  Recent Labs  Lab 08/23/24 2126 08/23/24 2308  WBC 16.3*  --   CREATININE 1.42*  --   LATICACIDVEN 3.0* 1.9    Estimated Creatinine Clearance: 68.4 mL/min (A) (by C-G formula based on SCr of 1.42 mg/dL (H)).    Allergies[1]  Antimicrobials this admission:   >>    >>   Dose adjustments this admission:   Microbiology results:  BCx:   UCx:    Sputum:    MRSA PCR:   Thank you for allowing pharmacy to be a part of this patients care.  Lore Polka D 08/24/2024 1:36 AM     [1]  Allergies Allergen Reactions   Propranolol  Other (See Comments)    Hallucinations    Albuterol  Other (See Comments)    Causes afib    Gramineae Pollens Other (See Comments)    Sneeze and watery eyes Sneeze and watery eyes   Other Other (See Comments)    Other reaction(s): Other (See Comments) Unable to breath when around cats Other reaction(s): Other (See Comments) Unable to breath when around cats

## 2024-08-24 NOTE — H&P (Signed)
 " History and Physical    Christopher [name] FMW:[id number] DOB: [birth year] DOA: 08/23/2024  Referring MD/NP/PA:   PCP: Cleotilde Oneil FALCON, MD   Patient coming from:  The patient is coming from home.     Chief Complaint: fever, chills and weakness  HPI: Christopher [name] is a 74 y.o. male with medical history significant of morbid obesity with BMI of 44.47, HTN, HLD, dCHF, COPD on 2L O2, hypothyroidism, depression with anxiety, CKD stage IIIb, A-fib not on anticoagulants, OSA not on CPAP, chronic venous insufficiency change in both legs, hypotension on midodrine , who presents with fever, chills, weakness.  Per patient and his daughter at the bedside, he developed fever and chills today.  His temperature is 101.7 in ED.  He has generalized weakness.  He has erythema in his lower abdominal wall, no significant pain.  He also has chronic sacral ulcer, no active drainage.  He has ulcer and maceration between 4th and 5th toe in the left foot.  Patient has mild dry cough, no SOB or chest pain.  No nausea, vomiting, diarrhea or abdominal pain.  No symptoms of UTI.  No erythema in lower extremities.  Patient is alert and orientated x 3 when I saw patient in ED. Per his daughter, patient does not have confusion today.  No neck rigidity or neck pain.  Data reviewed independently and ED Course: pt was found to have WBC 16.3, lactic acid 3.0 --> 1.9, negative UA, stable renal function, negative PCR for COVID, flu and RSV, troponin 25- > 27, temperature 101.7, blood pressure 108/66, heart rate 151 --> 130s, RR 33, oxygen saturation 99% on home level of 2 L oxygen.  Chest x-ray showed mild central vascular congestion.  CT of chest/abdomen/pelvis negative for acute issues.  Patient is admitted to PCU as inpatient.   EKG: I have personally reviewed.  SVT, heart rate of 152, QTc 435, low voltage.   Review of Systems:   General: has fevers, chills, no body weight gain, has fatigue HEENT: no blurry vision, hearing changes  or sore throat Respiratory: no dyspnea, has coughing, no wheezing CV: no chest pain, no palpitations GI: no nausea, vomiting, abdominal pain, diarrhea, constipation GU: no dysuria, burning on urination, increased urinary frequency, hematuria  Ext: no leg edema Neuro: no unilateral weakness, numbness, or tingling, no vision change or hearing loss Skin: has sacral ulcer.  Has erythema in lower abdominal wall.  Has left foot ulcer and maceration MSK: No muscle spasm, no deformity, no limitation of range of movement in spin Heme: No easy bruising.  Travel history: No recent long distant travel.   Allergy: Allergies[1]  Past Medical History:  Diagnosis Date   Anxiety    CHF (congestive heart failure) (HCC)    COPD (chronic obstructive pulmonary disease) (HCC)    Depression    Dysrhythmia    Hyperlipidemia    Hypertension    Hypothyroidism    Sleep apnea     Past Surgical History:  Procedure Laterality Date   FRACTURE SURGERY     ORIF ANKLE FRACTURE Left 12/17/2020   Procedure: OPEN REDUCTION INTERNAL FIXATION (ORIF) ANKLE FRACTURE;  Surgeon: Ashley Soulier, DPM;  Location: [facility] ORS;  Service: Podiatry;  Laterality: Left;   ORIF FEMUR FRACTURE Left 01/20/2023   Procedure: OPEN REDUCTION INTERNAL FIXATION (ORIF) DISTAL FEMUR FRACTURE;  Surgeon: Kendal Franky SQUIBB, MD;  Location: [facility] OR;  Service: Orthopedics;  Laterality: Left;    Social History:  reports that he has never smoked. He has never  used smokeless tobacco. He reports that he does not currently use alcohol. He reports that he does not use drugs.  Family History:  Family History  Problem Relation Age of Onset   Hypertension Mother      Prior to Admission medications  Medication Sig Start Date End Date Taking? Authorizing Provider  acetaminophen  (TYLENOL ) 325 MG tablet Take 2 tablets (650 mg total) by mouth every 6 (six) hours as needed for mild pain, fever, headache or moderate pain. 05/23/21   Josette Ade, MD   albuterol  (VENTOLIN  HFA) 108 (90 Base) MCG/ACT inhaler Inhale 1-2 puffs into the lungs every 6 (six) hours as needed for wheezing or shortness of breath.    [provider]  aspirin  81 MG EC tablet Take 81 mg by mouth at bedtime.    [provider]  atorvastatin  (LIPITOR) 20 MG tablet Take 20 mg by mouth at bedtime. 10/06/20   [provider]  bisacodyl  (DULCOLAX) 10 MG suppository Place 1 suppository (10 mg total) rectally daily as needed for moderate constipation. Patient not taking: Reported on 01/26/2024 01/25/23   Sherrill Cable Latif, DO  buPROPion  (WELLBUTRIN  SR) 150 MG 12 hr tablet Take 1 tablet (150 mg total) by mouth 2 (two) times daily. After 1st meal of day and after dinner 01/30/24 06/15/24  Agbata, Aimee, MD  Cholecalciferol  25 MCG (1000 UT) capsule Take 1,000 Units by mouth at bedtime.    [provider]  cyanocobalamin  1000 MCG tablet Take 1,000 mcg by mouth 2 (two) times a week. At bedtime. Sundays and Wednesdays    [provider]  levalbuterol  (XOPENEX  HFA) 45 MCG/ACT inhaler Inhale 1-2 puffs into the lungs every 4 (four) hours as needed for wheezing.    [provider]  levothyroxine  (SYNTHROID ) 88 MCG tablet Take 88 mcg by mouth daily. 30 to 60 minutes before breakfast on an empty stomach and with a glass of water 12/02/20   [provider]  loratadine  (CLARITIN ) 10 MG tablet Take 10 mg by mouth daily.    [provider]  Magnesium  250 MG TABS Take 1 tablet by mouth daily.    [provider]  melatonin 3 MG TABS tablet Take 3 mg by mouth at bedtime.    [provider]  midodrine  (PROAMATINE ) 10 MG tablet Take 1 tablet (10 mg total) by mouth 3 (three) times daily. 06/12/23   Alexander, Natalie, DO  Multiple Vitamins-Minerals (PRESERVISION AREDS 2) CAPS Take 1 capsule by mouth 2 (two) times daily. With food. After 1st meal of day and after dinner    [provider]  ondansetron   (ZOFRAN -ODT) 4 MG disintegrating tablet Take 1 tablet (4 mg total) by mouth every 8 (eight) hours as needed for nausea or vomiting. 06/18/24   Wouk, Devaughn Sayres, MD  potassium chloride  (KLOR-CON ) 10 MEQ tablet Take 10 mEq by mouth 2 (two) times daily. 02/02/24   [provider]  senna-docusate (SENOKOT-S) 8.6-50 MG tablet Take 1 tablet by mouth 2 (two) times daily. Patient not taking: Reported on 01/26/2024 01/25/23   Sherrill Cable Latif, DO  sertraline  (ZOLOFT ) 50 MG tablet Take 50 mg by mouth daily.    [provider]  torsemide  (DEMADEX ) 20 MG tablet Take 1 tablet (20 mg total) by mouth daily as needed (severe edema). 06/12/23   Alexander, Natalie, DO  trolamine salicylate (ASPERCREME) 10 % cream Apply 1 application topically as needed for muscle pain. Apply to knees and feet    [provider]  Vitamins  A & D (VITAMIN A & D) ointment Apply 1 application topically as needed. To groin area.    [provider]    Physical Exam: Vitals:   08/23/24 2300 08/23/24 2310 08/23/24 2315 08/23/24 2345  BP: 108/66     Pulse: (!) 144  (!) 136   Resp: (!) 28  18   Temp:  (!) 101.7 F (38.7 C)  99 F (37.2 C)  TempSrc:  Oral  Oral  SpO2: 98%  99%   Weight:      Height:       General: Not in acute distress HEENT:       Eyes: PERRL, EOMI, no jaundice       ENT: No discharge from the ears and nose, no pharynx injection, no tonsillar enlargement.        Neck: No JVD, no bruit, no mass felt. Heme: No neck lymph node enlargement. Cardiac: S1/S2, RRR, No murmurs, No gallops or rubs. Respiratory: No rales, wheezing, rhonchi or rubs. GI: Soft, nondistended, nontender, no rebound pain, no organomegaly, BS present. GU: No hematuria Ext: No pitting leg edema bilaterally.  Has chronic venous insufficiency change in both legs.  1+DP/PT pulse bilaterally. Musculoskeletal: No joint deformities, No joint redness or warmth, no limitation of ROM in spin. Skin: Has erythema in  lower abdominal wall; has sacral ulcer without active drainage; has left foot ulcer and maceration between 4th and 5th toe, no active drainage or erythema.             Neuro: Alert, oriented X3, cranial nerves II-XII grossly intact, moves all extremities.  Neck supple. Psych: Patient is not psychotic, no suicidal or hemocidal ideation.  Labs on Admission: I have personally reviewed following labs and imaging studies  CBC: Recent Labs  Lab 08/23/24 2126  WBC 16.3*  NEUTROABS 13.8*  HGB 13.8  HCT 45.7  MCV 87.0  PLT 230   Basic Metabolic Panel: Recent Labs  Lab 08/23/24 2126  NA 142  K 4.9  CL 100  CO2 30  GLUCOSE 100*  BUN 36*  CREATININE 1.42*  CALCIUM  9.1  MG 2.0   GFR: Estimated Creatinine Clearance: 68.4 mL/min (A) (by C-G formula based on SCr of 1.42 mg/dL (H)). Liver Function Tests: Recent Labs  Lab 08/23/24 2126  AST 22  ALT 7  ALKPHOS 61  BILITOT 0.5  PROT 7.1  ALBUMIN  4.0   No results for input(s): LIPASE, AMYLASE in the last 168 hours. No results for input(s): AMMONIA in the last 168 hours. Coagulation Profile: No results for input(s): INR, PROTIME in the last 168 hours. Cardiac Enzymes: No results for input(s): CKTOTAL, CKMB, CKMBINDEX, TROPONINI in the last 168 hours. BNP (last 3 results) No results for input(s): PROBNP in the last 8760 hours. HbA1C: No results for input(s): HGBA1C in the last 72 hours. CBG: No results for input(s): GLUCAP in the last 168 hours. Lipid Profile: No results for input(s): CHOL, HDL, LDLCALC, TRIG, CHOLHDL, LDLDIRECT in the last 72 hours. Thyroid Function Tests: No results for input(s): TSH, T4TOTAL, FREET4, T3FREE, THYROIDAB in the last 72 hours. Anemia Panel: No results for input(s): VITAMINB12, FOLATE, FERRITIN, TIBC, IRON, RETICCTPCT in the last 72 hours. Urine analysis:    Component Value Date/Time   COLORURINE YELLOW (A) 08/23/2024 2145    APPEARANCEUR CLEAR (A) 08/23/2024 2145   LABSPEC 1.014 08/23/2024 2145   PHURINE 8.0 08/23/2024 2145   GLUCOSEU NEGATIVE 08/23/2024 2145   HGBUR NEGATIVE 08/23/2024 2145   BILIRUBINUR NEGATIVE  08/23/2024 2145   KETONESUR NEGATIVE 08/23/2024 2145   PROTEINUR NEGATIVE 08/23/2024 2145   NITRITE NEGATIVE 08/23/2024 2145   LEUKOCYTESUR NEGATIVE 08/23/2024 2145   Sepsis Labs: @LABRCNTIP (procalcitonin:4,lacticidven:4) ) Recent Results (from the past 240 hours)  Resp panel by RT-PCR (RSV, Flu A&B, Covid) Anterior Nasal Swab     Status: None   Collection Time: 08/23/24  9:26 PM   Specimen: Anterior Nasal Swab  Result Value Ref Range Status   SARS Coronavirus 2 by RT PCR NEGATIVE NEGATIVE Final    Comment: (NOTE) SARS-CoV-2 target nucleic acids are NOT DETECTED.  The SARS-CoV-2 RNA is generally detectable in upper respiratory specimens during the acute phase of infection. The lowest concentration of SARS-CoV-2 viral copies this assay can detect is 138 copies/mL. A negative result does not preclude SARS-Cov-2 infection and should not be used as the sole basis for treatment or other patient management decisions. A negative result may occur with  improper specimen collection/handling, submission of specimen other than nasopharyngeal swab, presence of viral mutation(s) within the areas targeted by this assay, and inadequate number of viral copies(<138 copies/mL). A negative result must be combined with clinical observations, patient history, and epidemiological information. The expected result is Negative.  Fact Sheet for Patients:  bloggercourse.com  Fact Sheet for Healthcare Providers:  seriousbroker.it  This test is no t yet approved or cleared by the [place]  FDA and  has been authorized for detection and/or diagnosis of SARS-CoV-2 by FDA under an Emergency Use Authorization (EUA). This EUA will remain  in effect (meaning  this test can be used) for the duration of the COVID-19 declaration under Section 564(b)(1) of the Act, 21 U.S.C.section 360bbb-3(b)(1), unless the authorization is terminated  or revoked sooner.       Influenza A by PCR NEGATIVE NEGATIVE Final   Influenza B by PCR NEGATIVE NEGATIVE Final    Comment: (NOTE) The Xpert Xpress SARS-CoV-2/FLU/RSV plus assay is intended as an aid in the diagnosis of influenza from Nasopharyngeal swab specimens and should not be used as a sole basis for treatment. Nasal washings and aspirates are unacceptable for Xpert Xpress SARS-CoV-2/FLU/RSV testing.  Fact Sheet for Patients: bloggercourse.com  Fact Sheet for Healthcare Providers: seriousbroker.it  This test is not yet approved or cleared by the [place]  FDA and has been authorized for detection and/or diagnosis of SARS-CoV-2 by FDA under an Emergency Use Authorization (EUA). This EUA will remain in effect (meaning this test can be used) for the duration of the COVID-19 declaration under Section 564(b)(1) of the Act, 21 U.S.C. section 360bbb-3(b)(1), unless the authorization is terminated or revoked.     Resp Syncytial Virus by PCR NEGATIVE NEGATIVE Final    Comment: (NOTE) Fact Sheet for Patients: bloggercourse.com  Fact Sheet for Healthcare Providers: seriousbroker.it  This test is not yet approved or cleared by the [place]  FDA and has been authorized for detection and/or diagnosis of SARS-CoV-2 by FDA under an Emergency Use Authorization (EUA). This EUA will remain in effect (meaning this test can be used) for the duration of the COVID-19 declaration under Section 564(b)(1) of the Act, 21 U.S.C. section 360bbb-3(b)(1), unless the authorization is terminated or revoked.  Performed at [facility], [address], [place], [place] [address]      Radiological Exams on  Admission:   Assessment/Plan Principal Problem:   Abdominal wall cellulitis Active Problems:   Severe sepsis (HCC)   Hypotension   Foot ulcer (HCC)   Paroxysmal atrial fibrillation (HCC)  Hyperlipidemia   Hypothyroidism   Chronic diastolic CHF (congestive heart failure) (HCC)   COPD (chronic obstructive pulmonary disease) (HCC)   Stage 3b chronic kidney disease (CKD) (HCC)   Sacral ulcer (HCC)   Depression   Obesity, Class III, BMI 40-49.9 (morbid obesity) (HCC)   Assessment and Plan:  Severe sepsis due to  abdominal wall cellulitis: Patient meets criteria for severe sepsis with WBC 16.1, heart rate up to 151, RR  up tot 33, temperature 101.7, elevated lactic acid 3.0 --> 1.9.  Initially no clear source for infection was identified.  UA negative, CT scan of abdomen/pelvis/chest negative for acute findings.  Due to concerning for possible meningitis, patient was given 1 dose of vancomycin , cefepime , Rocephin , ampicillin and 10 mg of Decadron  in ED.  On my examination, patient does not have meningeal sign, no neck rigidity and no confusion, clinically low suspicions for meningitis. Patient has erythema in the lower extremities, indicating possible abdominal wall cellulitis.  Intertriginous candidiasis is also possible.  - will admit to PCU as inpatient - Continue empiric antimicrobial treatment with vancomycin  and Rocephin  - Nystatin  powder topically - PRN Zofran  for nausea, and Tylenol  for pain - Blood cultures x 2  - ESR and CRP - wound care consult - IVF: 2.5 L of LR bolus in ED   Hypotension: SBP 100-110s - Continue midodrine  10 mg 3 times daily  Foot ulcer [facility]): Patient has left foot ulcer and maceration between 4th and 5th toe, but no active drainage or erythema.  Does not seem to have cellulitis. - Wound care consult - Follow-up x-ray of her left foot  Paroxysmal atrial fibrillation (HCC): Heart rate up to 150s.  EKG showed SVT. -As needed metoprolol  2.5 mg every 2  hour for heart rate> 125. - If heart rate is not controlled, may start Cardizem drip.  If blood pressure is too low for Cardizem drip, may consider amiodarone drip.  Hyperlipidemia -Lipitor  Hypothyroidism -Synthroid   Chronic diastolic CHF (congestive heart failure) (HCC): 2D echo on 01/20/2023 showed EF of 55-6% with grade 1 diastolic dysfunction.  Patient does not have leg edema.  Does not have SOB.  CHF seems to be compensated. - Hold torsemide  due to severe sepsis  COPD (chronic obstructive pulmonary disease) (HCC) and chronic respiratory failure: Patient does not have worsening SOB.  Oxygen saturation 99% on home level 2 L oxygen.  COPD stable. -Bronchodilators as needed Mucinex .  Stage 3b chronic kidney disease (CKD) (HCC): Renal function close to baseline.  Recent creatinine 1.3-1.4 recently.  His creatinine is 1.42, BUN 36, GFR 52. -Follow-up with BMP  Sacral ulcer (HCC): No active drainage.  Does not seem to have acute infection. -Wound care consult  Depression -Continue home Zoloft  and Wellbutrin   Obesity, Class III, BMI 40-49.9 (morbid obesity) (HCC): Patient has Obesity Class III, with body weight 148.7 Kg and BMI 44.47 kg/m2.  - Encourage losing weight - Exercise and healthy diet - I discussed with patient and his daughter.  I highly recommended them to discuss with patient's PCP for possibility of starting new medications, such as Ozempic.      DVT ppx: SQ Lovenox   Code Status: Full code     Family Communication:  Yes, patient's daughter   at bed side  Disposition Plan:  Anticipate discharge back to previous environment  Consults called:  none  Admission status and Level of care: Progressive: as inpt        Dispo: The patient is from: Home  Anticipated d/c is to: Home              Anticipated d/c date is: 2 days              Patient currently is not medically stable to d/c.    Severity of Illness:  The appropriate patient status for  this patient is INPATIENT. Inpatient status is judged to be reasonable and necessary in order to provide the required intensity of service to ensure the patient's safety. The patient's presenting symptoms, physical exam findings, and initial radiographic and laboratory data in the context of their chronic comorbidities is felt to place them at high risk for further clinical deterioration. Furthermore, it is not anticipated that the patient will be medically stable for discharge from the hospital within 2 midnights of admission.   * I certify that at the point of admission it is my clinical judgment that the patient will require inpatient hospital care spanning beyond 2 midnights from the point of admission due to high intensity of service, high risk for further deterioration and high frequency of surveillance required.*       Date of Service 08/24/2024    Caleb Exon [facility]   If 7PM-7AM, please contact night-coverage [email or web address] 08/24/2024, 1:29 AM     [1]  Allergies Allergen Reactions   Propranolol  Other (See Comments)    Hallucinations    Albuterol  Other (See Comments)    Causes afib    Gramineae Pollens Other (See Comments)    Sneeze and watery eyes Sneeze and watery eyes   Other Other (See Comments)    Other reaction(s): Other (See Comments) Unable to breath when around cats Other reaction(s): Other (See Comments) Unable to breath when around cats   "

## 2024-08-24 NOTE — ED Notes (Signed)
 Attempted to obtain blood work 2xs unsuccessful.

## 2024-08-25 ENCOUNTER — Inpatient Hospital Stay

## 2024-08-25 LAB — CBC
HCT: 38.9 % — ABNORMAL LOW (ref 39.0–52.0)
Hemoglobin: 11.4 g/dL — ABNORMAL LOW (ref 13.0–17.0)
MCH: 25.7 pg — ABNORMAL LOW (ref 26.0–34.0)
MCHC: 29.3 g/dL — ABNORMAL LOW (ref 30.0–36.0)
MCV: 87.8 fL (ref 80.0–100.0)
Platelets: 223 K/uL (ref 150–400)
RBC: 4.43 MIL/uL (ref 4.22–5.81)
RDW: 15.8 % — ABNORMAL HIGH (ref 11.5–15.5)
WBC: 16.1 K/uL — ABNORMAL HIGH (ref 4.0–10.5)
nRBC: 0 % (ref 0.0–0.2)

## 2024-08-25 LAB — BASIC METABOLIC PANEL WITH GFR
Anion gap: 10 (ref 5–15)
BUN: 39 mg/dL — ABNORMAL HIGH (ref 8–23)
CO2: 27 mmol/L (ref 22–32)
Calcium: 8.9 mg/dL (ref 8.9–10.3)
Chloride: 103 mmol/L (ref 98–111)
Creatinine, Ser: 1.55 mg/dL — ABNORMAL HIGH (ref 0.61–1.24)
GFR, Estimated: 47 mL/min — ABNORMAL LOW
Glucose, Bld: 134 mg/dL — ABNORMAL HIGH (ref 70–99)
Potassium: 4.1 mmol/L (ref 3.5–5.1)
Sodium: 140 mmol/L (ref 135–145)

## 2024-08-25 MED ORDER — LORAZEPAM 2 MG/ML IJ SOLN
0.5000 mg | Freq: Once | INTRAMUSCULAR | Status: AC
  Administered 2024-08-25: 0.5 mg via INTRAVENOUS
  Filled 2024-08-25: qty 1

## 2024-08-25 NOTE — Plan of Care (Signed)

## 2024-08-25 NOTE — Progress Notes (Addendum)
 The patient is a 74 yr old man who presented to [facility] ED on 08/23/2024 with complaints fo fever, chills, and weakness. These began early in the day on 08/23/2024.   In the ED the patient is found to have a fever of 101.7,  WBC 16.3, lactic acid 3.0 --> 1.9, negative UA, stable renal function, negative PCR for COVID, flu and RSV, troponin 25- > 27, temperature 101.7, blood pressure 108/66, heart rate 151 --> 130s, RR 33, oxygen saturation 99% on home level of 2 L oxygen.  Chest x-ray showed mild central vascular congestion.  CT of chest/abdomen/pelvis negative for acute issues.    The patient carries a medical history significant for f morbid obesity with BMI of 44.47, HTN, HLD, dCHF, COPD on 2L O2, hypothyroidism, depression with anxiety, CKD stage IIIb, A-fib not on anticoagulants, OSA not on CPAP, chronic venous insufficiency change in both legs, hypotension on midodrine .  On physical exam the patient is found to have a severe intertrigonal yeast infection underneath his pannus as well as an ulcer between the 4th and 5th digits of his left foot. Heart and lung exam is within normal limits.   The patient has been admitted to a progressive bed by my colleague earlier today. He is receiving rocephin  and wound care. He has had some rapid heart rates for which he has metoprolol  2.5 IV ordered prn HR greater than 125. He is getting nystatin  powder for the intertrigonal candidal infection.   Will order MRI of the left foot to rule out osteomyelitis.

## 2024-08-25 NOTE — Progress Notes (Signed)
 " Progress Note   PatientJayjay [name] FMW:[id number] DOB: [birth year] DOA: 08/23/2024     1 DOS: the patient was seen and examined on 08/25/2024   Brief hospital course: The patient is a 74 yr old man who presented to [facility] ED on 08/23/2024 with complaints fo fever, chills, and weakness. These began early in the day on 08/23/2024.    In the ED the patient is found to have a fever of 101.7,  WBC 16.3, lactic acid 3.0 --> 1.9, negative UA, stable renal function, negative PCR for COVID, flu and RSV, troponin 25- > 27, temperature 101.7, blood pressure 108/66, heart rate 151 --> 130s, RR 33, oxygen saturation 99% on home level of 2 L oxygen.  Chest x-ray showed mild central vascular congestion.  CT of chest/abdomen/pelvis negative for acute issues.     The patient carries a medical history significant for f morbid obesity with BMI of 44.47, HTN, HLD, dCHF, COPD on 2L O2, hypothyroidism, depression with anxiety, CKD stage IIIb, A-fib not on anticoagulants, OSA not on CPAP, chronic venous insufficiency change in both legs, hypotension on midodrine .   On physical exam the patient is found to have a severe intertrigonal yeast infection underneath his pannus as well as an ulcer between the 4th and 5th digits of his left foot. Heart and lung exam is within normal limits.    The patient has been admitted to a progressive bed by my colleague earlier today. He is receiving rocephin  and wound care. He has had some rapid heart rates for which he has metoprolol  2.5 IV ordered prn HR greater than 125. He is getting nystatin  powder for the intertrigonal candidal infection.    MRI to rule out osteomyelitis is pending.  Assessment and Plan:  Severe sepsis due to  abdominal wall cellulitis: Patient meets criteria for severe sepsis with WBC 16.1, heart rate up to 151, RR  up tot 33, temperature 101.7, elevated lactic acid 3.0 --> 1.9.  Initially no clear source for infection was identified.  UA negative, CT scan of  abdomen/pelvis/chest negative for acute findings.  Due to concerning for possible meningitis, patient was given 1 dose of vancomycin , cefepime , Rocephin , ampicillin  and 10 mg of Decadron  in ED.  On my examination, patient does not have meningeal sign, no neck rigidity and no confusion, clinically low suspicions for meningitis. Patient has erythema in the lower extremities, indicating possible abdominal wall cellulitis.  Intertriginous candidiasis is also possible.   - will admit to PCU as inpatient - Continue empiric antimicrobial treatment with vancomycin  and Rocephin  - Nystatin  powder topically - PRN Zofran  for nausea, and Tylenol  for pain - Blood cultures x 2  - ESR and CRP - wound care consult - IVF: 2.5 L of LR bolus in ED    Hypotension: SBP 100-110s - Continue midodrine  10 mg 3 times daily   Foot ulcer [facility]): Patient has left foot ulcer and maceration between 4th and 5th toe, but no active drainage or erythema.  Does not seem to have cellulitis. - Wound care consult - X-ray of the left foot is negative for osteomyelitis, but will check MRI left foot.   Paroxysmal atrial fibrillation (HCC): Heart rate up to 150s.  EKG showed SVT. -As needed metoprolol  2.5 mg every 2 hour for heart rate> 125. - If heart rate is not controlled, may start Cardizem drip.  If blood pressure is too low for Cardizem drip, may consider amiodarone drip.   Hyperlipidemia -Lipitor   Hypothyroidism -Synthroid   Chronic diastolic CHF (congestive heart failure) (HCC): 2D echo on 01/20/2023 showed EF of 55-6% with grade 1 diastolic dysfunction.  Patient does not have leg edema.  Does not have SOB.  CHF seems to be compensated. - Hold torsemide  due to severe sepsis   COPD (chronic obstructive pulmonary disease) (HCC) and chronic respiratory failure: Patient does not have worsening SOB.  Oxygen saturation 99% on home level 2 L oxygen.  COPD stable. -Bronchodilators as needed Mucinex .   Stage 3b chronic kidney  disease (CKD) (HCC): Renal function close to baseline.  Recent creatinine 1.3-1.4 recently.  His creatinine is 1.42, BUN 36, GFR 52. -Follow-up with BMP   Sacral ulcer (HCC): No active drainage.  Does not seem to have acute infection. -Wound care consult   Depression -Continue home Zoloft  and Wellbutrin    Obesity, Class III, BMI 40-49.9 (morbid obesity) (HCC): Patient has Obesity Class III, with body weight 148.7 Kg and BMI 44.47 kg/m2.  - Encourage losing weight - Exercise and healthy diet - I discussed with patient and his daughter.  I highly recommended them to discuss with patient's PCP for possibility of starting new medications, such as Ozempic.    DVT ppx: SQ Lovenox    Code Status: Full code      Family Communication:  Yes, patient's daughter   at bed side   Disposition Plan:  Anticipate discharge back to previous environment   Consults called:  none   Admission status and Level of care: Progressive: as inpt            Subjective: This patient is resting comfortably. No new complaints.   Physical Exam: Vitals:   08/25/24 0359 08/25/24 0600 08/25/24 0750 08/25/24 1206  BP: (!) 105/56  116/61 (!) 120/54  Pulse: 70  63 66  Resp: 18  19 18   Temp: 98.6 F (37 C)  97.9 F (36.6 C) 97.7 F (36.5 C)  TempSrc:   Oral Oral  SpO2: 98%  98% 97%  Weight:  (!) 156.7 kg    Height:       Exam:  Constitutional:  The patient is awake, alert, and oriented x 3. No acute distress. Respiratory:  No increased work of breathing. No wheezes, rales, or rhonchi No tactile fremitus Cardiovascular:  Regular rate and rhythm No murmurs, ectopy, or gallups. No lateral PMI. No thrills. Abdomen:  Abdomen is soft, non-tender, non-distended No hernias, masses, or organomegaly Normoactive bowel sounds.  Musculoskeletal:  No cyanosis, clubbing, or edema Skin:  No rashes, lesions, ulcers palpation of skin: no induration or nodules Neurologic:  CN 2-12 intact Sensation all 4  extremities intact Psychiatric:  Mental status Mood, affect appropriate Orientation to person, place, time  judgment and insight appear intact  Data Reviewed:  There are no new results to review at this time.  Family Communication: The patient's son is at bedside.  Disposition: Status is: Inpatient Remains inpatient appropriate because: Need for IV antibiotics.  Planned Discharge Destination: tbd    Time spent: 43 minutes  Author: Edee Nifong, DO 08/25/2024 5:51 PM  For on call review [email or web address].  "

## 2024-08-25 NOTE — Plan of Care (Signed)

## 2024-08-26 ENCOUNTER — Other Ambulatory Visit: Payer: Self-pay

## 2024-08-26 DIAGNOSIS — L97529 Non-pressure chronic ulcer of other part of left foot with unspecified severity: Secondary | ICD-10-CM

## 2024-08-26 DIAGNOSIS — F341 Dysthymic disorder: Secondary | ICD-10-CM

## 2024-08-26 DIAGNOSIS — R7989 Other specified abnormal findings of blood chemistry: Secondary | ICD-10-CM

## 2024-08-26 DIAGNOSIS — I4891 Unspecified atrial fibrillation: Secondary | ICD-10-CM

## 2024-08-26 LAB — CREATININE, SERUM
Creatinine, Ser: 1.37 mg/dL — ABNORMAL HIGH (ref 0.61–1.24)
GFR, Estimated: 54 mL/min — ABNORMAL LOW

## 2024-08-26 MED ORDER — DOXYCYCLINE HYCLATE 100 MG PO TABS
100.0000 mg | ORAL_TABLET | Freq: Two times a day (BID) | ORAL | 0 refills | Status: AC
  Filled 2024-08-26: qty 10, 5d supply, fill #0

## 2024-08-26 MED ORDER — NYSTATIN 100000 UNIT/GM EX POWD
Freq: Two times a day (BID) | CUTANEOUS | 0 refills | Status: AC
  Filled 2024-08-26: qty 15, 30d supply, fill #0

## 2024-08-26 MED ORDER — MIDODRINE HCL 10 MG PO TABS: 10.0000 mg | ORAL_TABLET | Freq: Three times a day (TID) | ORAL | Status: AC

## 2024-08-26 MED ORDER — AMOXICILLIN-POT CLAVULANATE 875-125 MG PO TABS
1.0000 | ORAL_TABLET | Freq: Two times a day (BID) | ORAL | 0 refills | Status: AC
  Filled 2024-08-26: qty 10, 5d supply, fill #0

## 2024-08-26 MED ORDER — ZINC OXIDE 40 % EX OINT
TOPICAL_OINTMENT | Freq: Two times a day (BID) | CUTANEOUS | 0 refills | Status: AC
  Filled 2024-08-26: qty 56.7, fill #0

## 2024-08-26 NOTE — TOC Transition Note (Signed)
 Transition of Care [facility]) - Discharge Note   Patient Details  Name: Christopher [name] MRN: [id number] Date of Birth: 09-16-1950  Transition of Care [facility]) CM/SW Contact:  Victory Jackquline RAMAN, RN Phone Number: 08/26/2024, 2:17 PM   Clinical Narrative:     RNCM received a message from bedside nurse informing me that the patient needs transportation set up to take him home. Patient discharging home with HH/PT/OT/RN. RNCM spoke to patient's daughter who is at the patient's bedside and she confirmed that the patient is currently active with [facility] for PT. Referral sent to [facility] informing them of the patient being discharged today and wanting to resume services with them and to add OT and RN for wound care. [facility] accepted patient. Daughter made aware. Transportation set up with [facility] and he is next in line. MD and bedside nurse made aware. No further concerns. RNCM signing off.    Final next level of care: Home w Home Health Services Barriers to Discharge: Barriers Resolved   Patient Goals and CMS Choice            Discharge Placement                Patient to be transferred to facility by: [facility] Name of family member notified: [place] and Selinda Patient and family notified of of transfer: 08/26/24  Discharge Plan and Services Additional resources added to the After Visit Summary for                            [facility] Arranged: RN, PT, OT [facility] Agency: Other - See comment (Referral's sent via EPIC) Date HH Agency Contacted: 08/26/24   Representative spoke with at [facility] Agency: Referral's sent via [facility]  Social Drivers of Health (SDOH) Interventions SDOH Screenings   Food Insecurity: No Food Insecurity (08/24/2024)  Housing: Low Risk (08/24/2024)  Transportation Needs: No Transportation Needs (08/24/2024)  Utilities: Not At Risk (08/24/2024)  Depression (PHQ2-9): Low Risk (10/20/2022)  Social Connections: Socially Isolated (08/24/2024)  Tobacco Use: Low Risk (08/23/2024)      Readmission Risk Interventions     No data to display

## 2024-08-26 NOTE — Progress Notes (Signed)
 Physical Therapy Treatment Patient Details Name: Christopher [name] MRN: [id number] DOB: 1951-03-02 Today's Date: 08/26/2024   History of Present Illness Christopher [name] is a 74 y.o. male with medical history significant of morbid obesity with BMI of 44.47, HTN, HLD, dCHF, COPD on 2L O2, hypothyroidism, depression with anxiety, CKD stage IIIb, A-fib not on anticoagulants, OSA, chronic venous insufficiency change in both legs, hypotension on midodrine , who presents with fever, chills, weakness.    PT Comments  Pt was long sitting in bed upon arrival. He had removed O2 prior to arrival and endorses only wearing O2 at night. Sao2 > 2L throughout session. He was cooperative and agreeable but does endorse sleeping and staying in a lift chair throughout most of the day and every night. Pt remains close to baseline however will benefit from post acute skilled PT to maximize his independence while decreasing caregiver burden.    If plan is discharge home, recommend the following: A lot of help with walking and/or transfers;A lot of help with bathing/dressing/bathroom     Equipment Recommendations  None recommended by PT       Precautions / Restrictions Precautions Precautions: Fall Recall of Precautions/Restrictions: Intact Restrictions Weight Bearing Restrictions Per Provider Order: No     Mobility  Bed Mobility Overal bed mobility: Needs Assistance Bed Mobility: Supine to Sit, Sit to Supine  Supine to sit: Min assist, Used rails, HOB elevated (HOB elevated ~ 20 degrees) Sit to supine: Supervision, Used rails   Transfers Overall transfer level: Needs assistance Equipment used: Rolling walker (2 wheels) Transfers: Sit to/from Stand Sit to Stand: Min assist, Contact guard assist, From elevated surface (CGA from elevated. min-mod form lower surfaces. pt has and sleep in lift chair at home)   Ambulation/Gait Ambulation/Gait assistance: Contact guard assist Gait Distance (Feet): 45  Feet Assistive device: Rolling walker (2 wheels) Gait Pattern/deviations: Step-to pattern Gait velocity: decr  General Gait Details: Severe LLE external rotation. Its been like that for a long time.     Balance Overall balance assessment: Needs assistance Sitting-balance support: Feet supported Sitting balance-Leahy Scale: Good     Standing balance support: Bilateral upper extremity supported, During functional activity, Reliant on assistive device for balance Standing balance-Leahy Scale: Fair         Hotel Manager: No apparent difficulties  Cognition Arousal: Alert Behavior During Therapy: WFL for tasks assessed/performed, Flat affect    PT - Cognition Comments: Pt is A and O x 3. Agreeable to session and remains cooperative throughout. Following commands: Intact      Cueing Cueing Techniques: Verbal cues     General Comments General comments (skin integrity, edema, etc.): Pt was on 2 L o2 upon arrival with sao2 96%. Removed O2 throughout session with sao2 > 91%. Pt endorses only wearing O2 at night at baseline.      Pertinent Vitals/Pain Pain Assessment Pain Assessment: 0-10 Pain Score: 3  Pain Location: R knee Pain Descriptors / Indicators: Sore, Discomfort Pain Intervention(s): Limited activity within patient's tolerance, Monitored during session, Repositioned     PT Goals (current goals can now be found in the care plan section) Acute Rehab PT Goals Patient Stated Goal: return home with daughter + son in-law Progress towards PT goals: Progressing toward goals    Frequency    Min 2X/week       AM-PAC PT 6 Clicks Mobility   Outcome Measure  Help needed turning from your back to your side while in a flat bed without using bedrails?:  A Little Help needed moving from lying on your back to sitting on the side of a flat bed without using bedrails?: A Little Help needed moving to and from a bed to a chair (including a  wheelchair)?: A Little Help needed standing up from a chair using your arms (e.g., wheelchair or bedside chair)?: A Lot Help needed to walk in hospital room?: A Lot Help needed climbing 3-5 steps with a railing? : A Lot 6 Click Score: 15    End of Session Equipment Utilized During Treatment: Oxygen (2L O2 upon arrival removed and left off post session. RN aware.) Activity Tolerance: Patient limited by fatigue Patient left: in bed;with call bell/phone within reach;with family/visitor present Nurse Communication: Mobility status PT Visit Diagnosis: Muscle weakness (generalized) (M62.81);Other abnormalities of gait and mobility (R26.89);Difficulty in walking, not elsewhere classified (R26.2);Unsteadiness on feet (R26.81);Pain Pain - Right/Left: Right Pain - part of body: Knee     Time: 8968-8957 PT Time Calculation (min) (ACUTE ONLY): 11 min  Charges:    $Gait Training: 8-22 mins PT General Charges $$ ACUTE PT VISIT: 1 Visit                    Rankin Essex PTA 08/26/2024, 12:57 PM

## 2024-08-26 NOTE — Discharge Summary (Signed)
 " Physician Discharge Summary   Patient: Christopher [name] MRN: [id number] DOB: 1951-01-06  Admit date:     08/23/2024  Discharge date: 08/26/2024  Discharge Physician: Amaryllis Dare   PCP: Cleotilde Oneil FALCON, MD   Recommendations at discharge:  Please obtain CBC and BMP on follow-up Blood pressure was mildly elevated, please ensure that he needs to take midodrine  or can make it as needed. Please ensure completion of antibiotics Please encourage weight loss and better mobility to prevent further pressure injuries. Follow-up with primary care provider within a week  Discharge Diagnoses: Principal Problem:   Abdominal wall cellulitis Active Problems:   Severe sepsis (HCC)   Hypotension   Foot ulcer (HCC)   Paroxysmal atrial fibrillation (HCC)   Hyperlipidemia   Hypothyroidism   Chronic diastolic CHF (congestive heart failure) (HCC)   COPD (chronic obstructive pulmonary disease) (HCC)   Stage 3b chronic kidney disease (CKD) (HCC)   Sacral ulcer (HCC)   Depression   Obesity, Class III, BMI 40-49.9 (morbid obesity) (HCC)   Elevated troponin level   Atrial fibrillation with RVR [facility])  Hospital Course:  Christopher [name] is a 74 y.o. male with medical history significant of morbid obesity with BMI of 44.47, HTN, HLD, dCHF, COPD on 2L O2, hypothyroidism, depression with anxiety, CKD stage IIIb, A-fib not on anticoagulants, OSA not on CPAP, chronic venous insufficiency change in both legs, hypotension on midodrine , who presents with fever, chills, weakness.   On presentation patient was febrile at 101.7, did show some erythema in the lower abdominal wall, has chronic sacral ulcers with no sign of active infection.  Patient also has an ulcer and maceration between 4th and 5th toe in the left foot. Patient has mild dry cough, no SOB or chest pain. No nausea, vomiting, diarrhea or abdominal pain. No symptoms of UTI. No erythema in lower extremities.   Patient met sepsis criteria with fever, leukocytosis,  severe sepsis with lactic acidosis at 3 which responded well to IV fluid and resolved quickly.  UA was negative for any UTI.  Blood and urine cultures remain negative.  Patient likely had sepsis secondary to abdominal wall cellulitis.  There was also concern of severe intertrigonal yeast infection underneath his pannus for which he was given nystatin  powder.  Abdominal wall erythema resolved.  X-ray and MRI of left foot with concern of cellulitis, no osteomyelitis.  Patient received ceftriaxone  and vancomycin  while in the hospital and is being discharged on Augmentin  and doxycycline  to complete the course for cellulitis.  Patient to have multiple pressure injuries with no obvious sign of infection.  He should encouraged to stay immobilized and home health services was ordered for wound care and PT.  His blood pressure started trending up.  We continued home midodrine  and held on last day of his hospitalization.  He should be checking his blood pressure and taking midodrine  as needed.  Patient with history of paroxysmal atrial fibrillation, did had some RVR which improved.  EKG with concern of SVT.  He will continue the rest of his home medications and need to have a close follow-up with his primary care provider for further assistance.     Consultants: None Procedures performed: None Disposition: Home health Diet recommendation:  Cardiac diet DISCHARGE MEDICATION: Allergies as of 08/26/2024       Reactions   Propranolol  Other (See Comments)   Hallucinations   Albuterol  Other (See Comments)   Causes afib    Gramineae Pollens Other (See Comments)   Sneeze and  watery eyes Sneeze and watery eyes   Other Other (See Comments)   Other reaction(s): Other (See Comments) Unable to breath when around cats Other reaction(s): Other (See Comments) Unable to breath when around cats        Medication List     TAKE these medications    acetaminophen  325 MG tablet Commonly known as:  TYLENOL  Take 2 tablets (650 mg total) by mouth every 6 (six) hours as needed for mild pain, fever, headache or moderate pain.   albuterol  108 (90 Base) MCG/ACT inhaler Commonly known as: VENTOLIN  HFA Inhale 1-2 puffs into the lungs every 6 (six) hours as needed for wheezing or shortness of breath.   amoxicillin -clavulanate 875-125 MG tablet Commonly known as: AUGMENTIN  Take 1 tablet by mouth 2 (two) times daily for 5 days.   aspirin  EC 81 MG tablet Take 81 mg by mouth at bedtime.   atorvastatin  20 MG tablet Commonly known as: LIPITOR Take 20 mg by mouth at bedtime.   bisacodyl  10 MG suppository Commonly known as: DULCOLAX Place 1 suppository (10 mg total) rectally daily as needed for moderate constipation.   buPROPion  150 MG 12 hr tablet Commonly known as: WELLBUTRIN  SR Take 1 tablet (150 mg total) by mouth 2 (two) times daily. After 1st meal of day and after dinner   Cholecalciferol  25 MCG (1000 UT) capsule Take 1,000 Units by mouth at bedtime.   cyanocobalamin  1000 MCG tablet Take 1,000 mcg by mouth 2 (two) times a week. At bedtime. Sundays and Wednesdays   doxycycline  100 MG tablet Commonly known as: VIBRA -TABS Take 1 tablet (100 mg total) by mouth 2 (two) times daily for 5 days.   levalbuterol  45 MCG/ACT inhaler Commonly known as: XOPENEX  HFA Inhale 1-2 puffs into the lungs every 4 (four) hours as needed for wheezing.   levothyroxine  88 MCG tablet Commonly known as: SYNTHROID  Take 88 mcg by mouth daily. 30 to 60 minutes before breakfast on an empty stomach and with a glass of water   liver oil-zinc  oxide 40 % ointment Commonly known as: DESITIN Apply topically 2 (two) times daily.   loratadine  10 MG tablet Commonly known as: CLARITIN  Take 10 mg by mouth daily.   Magnesium  250 MG Tabs Take 1 tablet by mouth daily.   melatonin 3 MG Tabs tablet Take 3 mg by mouth at bedtime.   midodrine  10 MG tablet Commonly known as: PROAMATINE  Take 1 tablet (10 mg total)  by mouth 3 (three) times daily. Please check blood pressure before taking. What changed: additional instructions   nystatin  powder Commonly known as: MYCOSTATIN /NYSTOP  Apply topically 2 (two) times daily.   ondansetron  4 MG disintegrating tablet Commonly known as: ZOFRAN -ODT Take 1 tablet (4 mg total) by mouth every 8 (eight) hours as needed for nausea or vomiting.   potassium chloride  10 MEQ tablet Commonly known as: KLOR-CON  Take 10 mEq by mouth 2 (two) times daily.   PreserVision AREDS 2 Caps Take 1 capsule by mouth 2 (two) times daily. With food. After 1st meal of day and after dinner   senna-docusate 8.6-50 MG tablet Commonly known as: Senokot-S Take 1 tablet by mouth 2 (two) times daily.   sertraline  50 MG tablet Commonly known as: ZOLOFT  Take 50 mg by mouth daily.   torsemide  20 MG tablet Commonly known as: DEMADEX  Take 1 tablet (20 mg total) by mouth daily as needed (severe edema).   trolamine salicylate 10 % cream Commonly known as: ASPERCREME Apply 1 application topically as needed  for muscle pain. Apply to knees and feet   vitamin A & D ointment Apply 1 application topically as needed. To groin area.               Discharge Care Instructions  (From admission, onward)           Start     Ordered   08/26/24 0000  Discharge wound care:       Comments: Cleanse sacrococcygeal wound with NS, dry and apply silver hydrofiber Soila [phone]) to area daily (cut in a linear strip to fit area).  Secure with silicone foam or ABD pad and tape whichever is preferred  08/24/24 0551     08/24/24 1000    Wound care  2 times daily      Comments: 1.Cleanse underneath pannus with soap and water, dry. Sprinkle with Nystatin  powder then apply Interdry AG as follows: Order Gerlean # [phone] Measure and cut length of InterDry to fit in skin folds that have skin breakdown Tuck InterDry fabric into skin folds in a single layer, allow for 2 inches of overhang from skin edges  to allow for wicking to occur May remove to bathe; dry area thoroughly and then tuck into affected areas again Do not apply any creams or ointments when using InterDry DO NOT THROW AWAY FOR 5 DAYS unless soiled with stool DO NOT [facility] product, this will inactivate the silver in the material  New sheet of Interdry should be applied after 5 days of use if patient continues to have skin breakdown 2.         Cleanse buttocks/ischium with soap and water, dry and apply a thin layer of Desitin to area 2 times daily and prn soiling.   3.         Paint all toe wounds L foot with Betadine  2 times daily and allow to air dry.  Place a piece of silver hydrofiber ([place]son [phone] Aquacel AG) between 4th and 5th digit to separate and absorb any moisture.   08/26/24 1321            Follow-up Information     Cleotilde Oneil FALCON, MD. Schedule an appointment as soon as possible for a visit in 1 week(s).   Specialty: Internal Medicine Contact information: [address] [facility] [place] [address] [phone]                Discharge Exam: Fredricka Weights   08/23/24 2055 08/25/24 0600  Weight: (!) 148.7 kg (!) 156.7 kg   General.  Morbidly obese gentleman, in no acute distress. Pulmonary.  Lungs clear bilaterally, normal respiratory effort. CV.  Regular rate and rhythm, no JVD, rub or murmur. Abdomen.  Soft, nontender, nondistended, BS positive. CNS.  Alert and oriented .  No focal neurologic deficit. Extremities.  No edema, pulses intact and symmetrical.  Signs of chronic venous stasis. Psychiatry.  Judgment and insight appears normal.   Condition at discharge: stable  The results of significant diagnostics from this hospitalization (including imaging, microbiology, ancillary and laboratory) are listed below for reference.   Imaging Studies: MR FOOT LEFT WO CONTRAST Result Date: 08/26/2024 CLINICAL DATA:  Diabetic foot pain and swelling. EXAM: MRI OF THE  LEFT FOOT WITHOUT CONTRAST TECHNIQUE: Multiplanar, multisequence MR imaging of the left foot was performed. No intravenous contrast was administered. COMPARISON:  Radiographs 08/24/2024 FINDINGS: Examination is quite limited due to patient motion. Patient could not or would hold still for the exam. No  obvious T1 or T2 signal abnormality to suggest septic arthritis osteomyelitis. Subcutaneous soft tissue swelling/edema/fluid suggesting cellulitis no obvious discrete fluid collection to suggest a drainable abscess. Advanced fatty atrophy of the foot musculature with mild changes of myositis but no pyomyositis. Degenerative changes at the first MTP joint with minimal hallux valgus deformity. IMPRESSION: 1. Very limited examination due to patient motion. 2. No obvious findings for septic arthritis or osteomyelitis. 3. Subcutaneous soft tissue swelling/edema/fluid suggesting cellulitis. No obvious discrete fluid collection to suggest a drainable abscess. 4. Advanced fatty atrophy of the foot musculature with mild changes of myositis but no pyomyositis. Electronically Signed   By: MYRTIS Stammer M.D.   On: 08/26/2024 13:05   DG Foot 2 Views Left Result Date: 08/24/2024 EXAM: 2 VIEW(S) XRAY OF THE LEFT FOOT 08/24/2024 01:42:50 AM COMPARISON: None available. CLINICAL HISTORY: Left foot infection. FINDINGS: BONES AND JOINTS: Fixation hardware is noted in the distal tibia. The bones are osteopenic. There is no acute fracture or dislocation. Plantar and posterior calcaneal spurs are present. No definite cortical erosions are seen. No malalignment. SOFT TISSUES: There is diffuse soft tissue swelling of the lower extremity and foot. IMPRESSION: 1. No acute fracture or dislocation. 2. Diffuse soft tissue swelling of the lower extremity and foot. 3. No definite cortical erosions to suggest osteomyelitis. If there is high clinical concern for osteomyelitis MRI is more sensitive. 4. Osteopenic bones. Electronically signed by: Greig Pique MD 08/24/2024 01:47 AM EST RP Workstation: [id number]   CT CHEST ABDOMEN PELVIS W CONTRAST Result Date: 08/23/2024 EXAM: CT CHEST, ABDOMEN AND PELVIS WITH CONTRAST 08/23/2024 10:54:14 PM TECHNIQUE: CT of the chest, abdomen and pelvis was performed with the administration of intravenous contrast. 100mL iohexol  (OMNIPAQUE ) 350 MG/ML injection was administered. Multiplanar reformatted images are provided for review. Automated exposure control, iterative reconstruction, and/or weight based adjustment of the mA/kV was utilized to reduce the radiation dose to as low as reasonably achievable. COMPARISON: 06/15/2024 CLINICAL HISTORY: Sepsis FINDINGS: CHEST: MEDIASTINUM AND LYMPH NODES: Heart and pericardium are unremarkable. The central airways are clear. No mediastinal, hilar or axillary lymphadenopathy. LUNGS AND PLEURA: No focal consolidation or pulmonary edema. No pleural effusion. No pneumothorax. ABDOMEN AND PELVIS: LIVER: Unremarkable. GALLBLADDER AND BILE DUCTS: Cholelithiasis with no evidence of acute inflammation. No biliary ductal dilatation. SPLEEN: No acute abnormality. PANCREAS: No acute abnormality. ADRENAL GLANDS: No acute abnormality. KIDNEYS, URETERS AND BLADDER: No stones in the kidneys or ureters. No hydronephrosis. No perinephric or periureteral stranding. Urinary bladder is unremarkable. GI AND BOWEL: Stomach demonstrates no acute abnormality. There is no bowel obstruction. REPRODUCTIVE ORGANS: No acute abnormality. PERITONEUM AND RETROPERITONEUM: No ascites. No free air. VASCULATURE: Aorta is normal in caliber. ABDOMINAL AND PELVIS LYMPH NODES: No lymphadenopathy. REPRODUCTIVE ORGANS: No acute abnormality. BONES AND SOFT TISSUES: No acute osseous abnormality. No focal soft tissue abnormality. IMPRESSION: 1. No acute findings. Electronically signed by: Franky Stanford MD 08/23/2024 11:41 PM EST RP Workstation: [id number]   DG Chest Portable 1 View Result Date: 08/23/2024 CLINICAL DATA:   Fever. EXAM: PORTABLE CHEST 1 VIEW COMPARISON:  Chest radiograph dated 06/15/2024. FINDINGS: Mild central vascular and interstitial prominence may represent mild congestion. No focal consolidation, pleural effusion or pneumothorax. The cardiac silhouette. No acute osseous pathology. IMPRESSION: Mild central vascular and interstitial prominence may represent mild congestion. No focal consolidation. Electronically Signed   By: Vanetta Chou M.D.   On: 08/23/2024 21:38    Microbiology: Results for orders placed or performed during the hospital encounter of 08/23/24  Resp panel by RT-PCR (RSV, Flu A&B, Covid) Anterior Nasal Swab     Status: None   Collection Time: 08/23/24  9:26 PM   Specimen: Anterior Nasal Swab  Result Value Ref Range Status   SARS Coronavirus 2 by RT PCR NEGATIVE NEGATIVE Final    Comment: (NOTE) SARS-CoV-2 target nucleic acids are NOT DETECTED.  The SARS-CoV-2 RNA is generally detectable in upper respiratory specimens during the acute phase of infection. The lowest concentration of SARS-CoV-2 viral copies this assay can detect is 138 copies/mL. A negative result does not preclude SARS-Cov-2 infection and should not be used as the sole basis for treatment or other patient management decisions. A negative result may occur with  improper specimen collection/handling, submission of specimen other than nasopharyngeal swab, presence of viral mutation(s) within the areas targeted by this assay, and inadequate number of viral copies(<138 copies/mL). A negative result must be combined with clinical observations, patient history, and epidemiological information. The expected result is Negative.  Fact Sheet for Patients:  bloggercourse.com  Fact Sheet for Healthcare Providers:  seriousbroker.it  This test is no t yet approved or cleared by the [place]  FDA and  has been authorized for detection and/or diagnosis of  SARS-CoV-2 by FDA under an Emergency Use Authorization (EUA). This EUA will remain  in effect (meaning this test can be used) for the duration of the COVID-19 declaration under Section 564(b)(1) of the Act, 21 U.S.C.section 360bbb-3(b)(1), unless the authorization is terminated  or revoked sooner.       Influenza A by PCR NEGATIVE NEGATIVE Final   Influenza B by PCR NEGATIVE NEGATIVE Final    Comment: (NOTE) The Xpert Xpress SARS-CoV-2/FLU/RSV plus assay is intended as an aid in the diagnosis of influenza from Nasopharyngeal swab specimens and should not be used as a sole basis for treatment. Nasal washings and aspirates are unacceptable for Xpert Xpress SARS-CoV-2/FLU/RSV testing.  Fact Sheet for Patients: bloggercourse.com  Fact Sheet for Healthcare Providers: seriousbroker.it  This test is not yet approved or cleared by the [place]  FDA and has been authorized for detection and/or diagnosis of SARS-CoV-2 by FDA under an Emergency Use Authorization (EUA). This EUA will remain in effect (meaning this test can be used) for the duration of the COVID-19 declaration under Section 564(b)(1) of the Act, 21 U.S.C. section 360bbb-3(b)(1), unless the authorization is terminated or revoked.     Resp Syncytial Virus by PCR NEGATIVE NEGATIVE Final    Comment: (NOTE) Fact Sheet for Patients: bloggercourse.com  Fact Sheet for Healthcare Providers: seriousbroker.it  This test is not yet approved or cleared by the [place]  FDA and has been authorized for detection and/or diagnosis of SARS-CoV-2 by FDA under an Emergency Use Authorization (EUA). This EUA will remain in effect (meaning this test can be used) for the duration of the COVID-19 declaration under Section 564(b)(1) of the Act, 21 U.S.C. section 360bbb-3(b)(1), unless the authorization is terminated  or revoked.  Performed at [facility], [address] [place], [place] [address]   Culture, blood (routine x 2)     Status: None (Preliminary result)   Collection Time: 08/23/24  9:28 PM   Specimen: BLOOD RIGHT FOREARM  Result Value Ref Range Status   Specimen Description BLOOD RIGHT FOREARM  Final   Special Requests   Final    BOTTLES DRAWN AEROBIC AND ANAEROBIC Blood Culture results may not be optimal due to an inadequate volume of blood received in culture bottles   Culture  Final    NO GROWTH 3 DAYS Performed at [facility], [address] [place], [place] [address]    Report Status PENDING  Incomplete  Culture, blood (routine x 2)     Status: None (Preliminary result)   Collection Time: 08/23/24  9:28 PM   Specimen: BLOOD LEFT FOREARM  Result Value Ref Range Status   Specimen Description BLOOD LEFT FOREARM  Final   Special Requests   Final    BOTTLES DRAWN AEROBIC AND ANAEROBIC Blood Culture results may not be optimal due to an inadequate volume of blood received in culture bottles   Culture   Final    NO GROWTH 3 DAYS Performed at [facility], [address] [place], [place] [address]    Report Status PENDING  Incomplete    Labs: CBC: Recent Labs  Lab 08/23/24 2126 08/24/24 0255 08/25/24 0421  WBC 16.3* 22.7* 16.1*  NEUTROABS 13.8*  --   --   HGB 13.8 13.0 11.4*  HCT 45.7 42.7 38.9*  MCV 87.0 86.6 87.8  PLT 230 237 223   Basic Metabolic Panel: Recent Labs  Lab 08/23/24 2126 08/24/24 0255 08/24/24 1256 08/25/24 0421 08/26/24 0806  NA 142 138 138 140  --   K 4.9 5.2* 4.7 4.1  --   CL 100 103 105 103  --   CO2 30 26 24 27   --   GLUCOSE 100* 148* 142* 134*  --   BUN 36* 34* 34* 39*  --   CREATININE 1.42* 1.42* 1.43* 1.55* 1.37*  CALCIUM  9.1 8.9 8.5* 8.9  --   MG 2.0  --   --   --   --    Liver Function Tests: Recent Labs  Lab 08/23/24 2126  AST 22  ALT 7  ALKPHOS 61  BILITOT 0.5  PROT 7.1  ALBUMIN  4.0    CBG: No results for input(s): GLUCAP in the last 168 hours.  Discharge time spent: greater than 30 minutes.  This record has been created using Conservation officer, historic buildings. Errors have been sought and corrected,but may not always be located. Such creation errors do not reflect on the standard of care.   Signed: Amaryllis Dare, MD [facility] 08/26/2024 "

## 2024-08-28 LAB — CULTURE, BLOOD (ROUTINE X 2)
Culture: NO GROWTH
Culture: NO GROWTH
